# Patient Record
Sex: Male | Born: 1950
Health system: Southern US, Community
[De-identification: ages and names within clinical notes are randomized; demographics above are authoritative.]

## PROBLEM LIST (undated history)

## (undated) DIAGNOSIS — I5189 Other ill-defined heart diseases: Secondary | ICD-10-CM

## (undated) DIAGNOSIS — R079 Chest pain, unspecified: Secondary | ICD-10-CM

## (undated) DIAGNOSIS — F32A Depression, unspecified: Secondary | ICD-10-CM

## (undated) DIAGNOSIS — F17200 Nicotine dependence, unspecified, uncomplicated: Secondary | ICD-10-CM

## (undated) DIAGNOSIS — R519 Headache, unspecified: Secondary | ICD-10-CM

## (undated) DIAGNOSIS — J449 Chronic obstructive pulmonary disease, unspecified: Secondary | ICD-10-CM

## (undated) DIAGNOSIS — I219 Acute myocardial infarction, unspecified: Secondary | ICD-10-CM

## (undated) DIAGNOSIS — J40 Bronchitis, not specified as acute or chronic: Secondary | ICD-10-CM

## (undated) DIAGNOSIS — M545 Low back pain, unspecified: Secondary | ICD-10-CM

## (undated) DIAGNOSIS — G8929 Other chronic pain: Secondary | ICD-10-CM

## (undated) DIAGNOSIS — F329 Major depressive disorder, single episode, unspecified: Secondary | ICD-10-CM

## (undated) DIAGNOSIS — I2510 Atherosclerotic heart disease of native coronary artery without angina pectoris: Secondary | ICD-10-CM

## (undated) DIAGNOSIS — N183 Chronic kidney disease, stage 3 (moderate): Secondary | ICD-10-CM

## (undated) DIAGNOSIS — R0600 Dyspnea, unspecified: Secondary | ICD-10-CM

## (undated) DIAGNOSIS — R51 Headache: Secondary | ICD-10-CM

## (undated) DIAGNOSIS — R0601 Orthopnea: Secondary | ICD-10-CM

## (undated) DIAGNOSIS — E7800 Pure hypercholesterolemia, unspecified: Secondary | ICD-10-CM

## (undated) DIAGNOSIS — G4730 Sleep apnea, unspecified: Secondary | ICD-10-CM

## (undated) DIAGNOSIS — N1832 Chronic kidney disease, stage 3b: Secondary | ICD-10-CM

## (undated) DIAGNOSIS — R42 Dizziness and giddiness: Secondary | ICD-10-CM

## (undated) DIAGNOSIS — F419 Anxiety disorder, unspecified: Secondary | ICD-10-CM

## (undated) DIAGNOSIS — I209 Angina pectoris, unspecified: Secondary | ICD-10-CM

## (undated) DIAGNOSIS — H9190 Unspecified hearing loss, unspecified ear: Secondary | ICD-10-CM

## (undated) DIAGNOSIS — K219 Gastro-esophageal reflux disease without esophagitis: Secondary | ICD-10-CM

## (undated) DIAGNOSIS — I10 Essential (primary) hypertension: Secondary | ICD-10-CM

## (undated) HISTORY — DX: Sleep apnea, unspecified: G47.30

## (undated) HISTORY — DX: Dizziness and giddiness: R42

## (undated) HISTORY — DX: Other chronic pain: G89.29

## (undated) HISTORY — DX: Nicotine dependence, unspecified, uncomplicated: F17.200

## (undated) HISTORY — PX: CARDIAC CATHETERIZATION: SHX172

## (undated) HISTORY — DX: Other ill-defined heart diseases: I51.89

## (undated) HISTORY — DX: Chest pain, unspecified: R07.9

## (undated) HISTORY — PX: CORONARY ANGIOPLASTY: SHX604

---

## 1998-05-25 ENCOUNTER — Ambulatory Visit (HOSPITAL_COMMUNITY): Admission: RE | Admit: 1998-05-25 | Discharge: 1998-05-25 | Payer: Self-pay | Admitting: Pulmonary Disease

## 1998-05-25 ENCOUNTER — Encounter: Payer: Self-pay | Admitting: Pulmonary Disease

## 1998-06-10 ENCOUNTER — Encounter: Payer: Self-pay | Admitting: Pulmonary Disease

## 1998-06-10 ENCOUNTER — Ambulatory Visit (HOSPITAL_COMMUNITY): Admission: RE | Admit: 1998-06-10 | Discharge: 1998-06-10 | Payer: Self-pay | Admitting: Pulmonary Disease

## 2001-11-02 ENCOUNTER — Encounter (INDEPENDENT_AMBULATORY_CARE_PROVIDER_SITE_OTHER): Payer: Self-pay

## 2001-11-02 ENCOUNTER — Ambulatory Visit (HOSPITAL_COMMUNITY): Admission: RE | Admit: 2001-11-02 | Discharge: 2001-11-02 | Payer: Self-pay | Admitting: *Deleted

## 2002-12-20 ENCOUNTER — Encounter: Payer: Self-pay | Admitting: Family Medicine

## 2002-12-20 ENCOUNTER — Encounter: Admission: RE | Admit: 2002-12-20 | Discharge: 2002-12-20 | Payer: Self-pay | Admitting: Family Medicine

## 2002-12-21 ENCOUNTER — Encounter: Payer: Self-pay | Admitting: Family Medicine

## 2002-12-21 ENCOUNTER — Encounter: Admission: RE | Admit: 2002-12-21 | Discharge: 2002-12-21 | Payer: Self-pay | Admitting: Family Medicine

## 2003-07-01 ENCOUNTER — Encounter: Admission: RE | Admit: 2003-07-01 | Discharge: 2003-07-01 | Payer: Self-pay | Admitting: Family Medicine

## 2007-06-04 ENCOUNTER — Emergency Department (HOSPITAL_COMMUNITY): Admission: EM | Admit: 2007-06-04 | Discharge: 2007-06-04 | Payer: Self-pay | Admitting: Emergency Medicine

## 2009-06-07 ENCOUNTER — Emergency Department: Payer: Self-pay | Admitting: Emergency Medicine

## 2011-03-12 LAB — I-STAT 8, (EC8 V) (CONVERTED LAB)
Bicarbonate: 28.1 — ABNORMAL HIGH
Glucose, Bld: 107 — ABNORMAL HIGH
HCT: 48
Hemoglobin: 16.3
Operator id: 261381
Potassium: 4.1
Sodium: 135
TCO2: 30

## 2011-03-12 LAB — CBC
Hemoglobin: 15.6
MCHC: 35.8
MCV: 88.4
RBC: 4.94
WBC: 8.7

## 2011-03-12 LAB — DIFFERENTIAL
Basophils Relative: 1
Eosinophils Absolute: 1 — ABNORMAL HIGH
Lymphs Abs: 3.2
Monocytes Absolute: 0.8
Monocytes Relative: 9
Neutro Abs: 3.6

## 2011-03-12 LAB — POCT CARDIAC MARKERS
CKMB, poc: 1.3
CKMB, poc: 1.7
Myoglobin, poc: 73.9
Myoglobin, poc: 94.8
Operator id: 261381
Operator id: 261381
Troponin i, poc: <0.05
Troponin i, poc: <0.05

## 2011-03-12 LAB — B-NATRIURETIC PEPTIDE (CONVERTED LAB): Pro B Natriuretic peptide (BNP): <30

## 2011-05-22 ENCOUNTER — Emergency Department: Payer: Self-pay | Admitting: Emergency Medicine

## 2011-07-08 ENCOUNTER — Emergency Department: Payer: Self-pay | Admitting: *Deleted

## 2011-07-09 ENCOUNTER — Emergency Department: Payer: Self-pay | Admitting: Emergency Medicine

## 2011-07-09 LAB — BASIC METABOLIC PANEL
Anion Gap: 7 (ref 7–16)
BUN: 12 mg/dL (ref 7–18)
BUN: 14 mg/dL (ref 7–18)
Calcium, Total: 9.2 mg/dL (ref 8.5–10.1)
Calcium, Total: 9.6 mg/dL (ref 8.5–10.1)
Chloride: 101 mmol/L (ref 98–107)
Co2: 28 mmol/L (ref 21–32)
EGFR (African American): 52 — ABNORMAL LOW
EGFR (African American): 53 — ABNORMAL LOW
EGFR (Non-African Amer.): 44 — ABNORMAL LOW
Glucose: 97 mg/dL (ref 65–99)
Glucose: 99 mg/dL (ref 65–99)
Osmolality: 272 (ref 275–301)
Osmolality: 282 (ref 275–301)
Potassium: 4.4 mmol/L (ref 3.5–5.1)
Sodium: 141 mmol/L (ref 136–145)

## 2011-07-09 LAB — CBC
MCH: 30.6 pg (ref 26.0–34.0)
MCV: 88 fL (ref 80–100)
MCV: 89 fL (ref 80–100)
Platelet: 257 10*3/uL (ref 150–440)
Platelet: 276 10*3/uL (ref 150–440)
RBC: 4.62 10*6/uL (ref 4.40–5.90)
RDW: 13 % (ref 11.5–14.5)
WBC: 7.3 10*3/uL (ref 3.8–10.6)
WBC: 7.5 10*3/uL (ref 3.8–10.6)

## 2011-07-09 LAB — TSH: Thyroid Stimulating Horm: 0.975 u[IU]/mL

## 2011-07-09 LAB — TROPONIN I: Troponin-I: <0.02 ng/mL

## 2011-07-25 ENCOUNTER — Emergency Department: Payer: Self-pay | Admitting: Emergency Medicine

## 2011-07-25 LAB — CBC
HCT: 40.4 % (ref 40.0–52.0)
MCH: 30.3 pg (ref 26.0–34.0)
MCHC: 34 g/dL (ref 32.0–36.0)
MCV: 89 fL (ref 80–100)
RDW: 12.9 % (ref 11.5–14.5)

## 2011-07-25 LAB — COMPREHENSIVE METABOLIC PANEL
Alkaline Phosphatase: 63 U/L (ref 50–136)
Anion Gap: 11 (ref 7–16)
Chloride: 97 mmol/L — ABNORMAL LOW (ref 98–107)
Co2: 26 mmol/L (ref 21–32)
Creatinine: 2.19 mg/dL — ABNORMAL HIGH (ref 0.60–1.30)
Glucose: 106 mg/dL — ABNORMAL HIGH (ref 65–99)
Osmolality: 272 (ref 275–301)
SGOT(AST): 26 U/L (ref 15–37)
SGPT (ALT): 25 U/L
Sodium: 134 mmol/L — ABNORMAL LOW (ref 136–145)
Total Protein: 7.3 g/dL (ref 6.4–8.2)

## 2011-07-25 LAB — TROPONIN I: Troponin-I: <0.02 ng/mL

## 2012-09-21 ENCOUNTER — Emergency Department: Payer: Self-pay | Admitting: Unknown Physician Specialty

## 2012-09-21 LAB — COMPREHENSIVE METABOLIC PANEL
Albumin: 3.7 g/dL (ref 3.4–5.0)
Anion Gap: 5 — ABNORMAL LOW (ref 7–16)
Bilirubin,Total: 0.4 mg/dL (ref 0.2–1.0)
Calcium, Total: 9 mg/dL (ref 8.5–10.1)
Co2: 27 mmol/L (ref 21–32)
Creatinine: 1.84 mg/dL — ABNORMAL HIGH (ref 0.60–1.30)
EGFR (African American): 44 — ABNORMAL LOW
Glucose: 93 mg/dL (ref 65–99)
Osmolality: 273 (ref 275–301)
SGPT (ALT): 33 U/L (ref 12–78)
Sodium: 137 mmol/L (ref 136–145)

## 2012-09-21 LAB — CBC
HGB: 14.2 g/dL (ref 13.0–18.0)
MCH: 31.4 pg (ref 26.0–34.0)
MCHC: 35.1 g/dL (ref 32.0–36.0)
Platelet: 299 10*3/uL (ref 150–440)
RDW: 12.8 % (ref 11.5–14.5)

## 2012-09-21 LAB — CK TOTAL AND CKMB (NOT AT ARMC): CK, Total: 118 U/L (ref 35–232)

## 2012-09-21 LAB — TROPONIN I: Troponin-I: <0.02 ng/mL

## 2012-11-19 ENCOUNTER — Emergency Department: Payer: Self-pay | Admitting: Emergency Medicine

## 2012-11-19 LAB — BASIC METABOLIC PANEL
Anion Gap: 6 — ABNORMAL LOW (ref 7–16)
BUN: 19 mg/dL — ABNORMAL HIGH (ref 7–18)
Calcium, Total: 9.4 mg/dL (ref 8.5–10.1)
Co2: 26 mmol/L (ref 21–32)
Creatinine: 1.68 mg/dL — ABNORMAL HIGH (ref 0.60–1.30)
EGFR (African American): 49 — ABNORMAL LOW
EGFR (Non-African Amer.): 43 — ABNORMAL LOW
Glucose: 123 mg/dL — ABNORMAL HIGH (ref 65–99)
Osmolality: 276 (ref 275–301)
Potassium: 4.4 mmol/L (ref 3.5–5.1)
Sodium: 136 mmol/L (ref 136–145)

## 2012-11-19 LAB — TROPONIN I
Troponin-I: <0.02 ng/mL
Troponin-I: <0.02 ng/mL

## 2012-11-19 LAB — CK TOTAL AND CKMB (NOT AT ARMC): CK, Total: 214 U/L (ref 35–232)

## 2012-11-19 LAB — CBC
HGB: 14.8 g/dL (ref 13.0–18.0)
MCH: 30.9 pg (ref 26.0–34.0)
MCHC: 35.4 g/dL (ref 32.0–36.0)
MCV: 87 fL (ref 80–100)
RDW: 12.4 % (ref 11.5–14.5)

## 2013-01-12 ENCOUNTER — Ambulatory Visit: Payer: Self-pay | Admitting: Specialist

## 2013-01-22 ENCOUNTER — Ambulatory Visit: Payer: Self-pay | Admitting: Unknown Physician Specialty

## 2013-02-23 ENCOUNTER — Emergency Department: Payer: Self-pay | Admitting: Emergency Medicine

## 2013-02-27 ENCOUNTER — Emergency Department: Payer: Self-pay | Admitting: Emergency Medicine

## 2013-02-27 LAB — COMPREHENSIVE METABOLIC PANEL
Alkaline Phosphatase: 98 U/L (ref 50–136)
Anion Gap: 2 — ABNORMAL LOW (ref 7–16)
Bilirubin,Total: 0.6 mg/dL (ref 0.2–1.0)
Calcium, Total: 9 mg/dL (ref 8.5–10.1)
Creatinine: 1.65 mg/dL — ABNORMAL HIGH (ref 0.60–1.30)
EGFR (African American): 50 — ABNORMAL LOW
Glucose: 89 mg/dL (ref 65–99)
Potassium: 4 mmol/L (ref 3.5–5.1)
SGOT(AST): 25 U/L (ref 15–37)
SGPT (ALT): 26 U/L (ref 12–78)
Sodium: 137 mmol/L (ref 136–145)
Total Protein: 7 g/dL (ref 6.4–8.2)

## 2013-02-27 LAB — CBC
HGB: 15 g/dL (ref 13.0–18.0)
MCH: 30.6 pg (ref 26.0–34.0)
Platelet: 260 10*3/uL (ref 150–440)
RDW: 13.1 % (ref 11.5–14.5)
WBC: 7 10*3/uL (ref 3.8–10.6)

## 2013-04-20 ENCOUNTER — Emergency Department: Payer: Self-pay | Admitting: Emergency Medicine

## 2013-04-20 LAB — BASIC METABOLIC PANEL
Anion Gap: 3 — ABNORMAL LOW (ref 7–16)
BUN: 16 mg/dL (ref 7–18)
Co2: 30 mmol/L (ref 21–32)
Creatinine: 1.66 mg/dL — ABNORMAL HIGH (ref 0.60–1.30)
EGFR (African American): 50 — ABNORMAL LOW
EGFR (Non-African Amer.): 43 — ABNORMAL LOW
Potassium: 3.9 mmol/L (ref 3.5–5.1)
Sodium: 139 mmol/L (ref 136–145)

## 2013-04-20 LAB — MAGNESIUM: Magnesium: 1.6 mg/dL — ABNORMAL LOW

## 2013-04-20 LAB — CK: CK, Total: 129 U/L (ref 35–232)

## 2013-04-21 ENCOUNTER — Emergency Department: Payer: Self-pay | Admitting: Emergency Medicine

## 2013-04-21 LAB — BASIC METABOLIC PANEL
BUN: 13 mg/dL (ref 7–18)
Chloride: 104 mmol/L (ref 98–107)
Co2: 28 mmol/L (ref 21–32)
Creatinine: 1.59 mg/dL — ABNORMAL HIGH (ref 0.60–1.30)
EGFR (Non-African Amer.): 46 — ABNORMAL LOW
Osmolality: 269 (ref 275–301)
Potassium: 4 mmol/L (ref 3.5–5.1)

## 2013-04-21 LAB — URINALYSIS, COMPLETE
Bacteria: NONE SEEN
Bilirubin,UR: NEGATIVE
Glucose,UR: NEGATIVE mg/dL (ref 0–75)
Ketone: NEGATIVE
Leukocyte Esterase: NEGATIVE
Ph: 7 (ref 4.5–8.0)
Protein: NEGATIVE
RBC,UR: NONE SEEN /HPF (ref 0–5)
WBC UR: NONE SEEN /HPF (ref 0–5)

## 2013-04-21 LAB — CK: CK, Total: 182 U/L (ref 35–232)

## 2013-04-21 LAB — PHOSPHORUS: Phosphorus: 3 mg/dL (ref 2.5–4.9)

## 2013-04-21 LAB — MAGNESIUM: Magnesium: 1.6 mg/dL — ABNORMAL LOW

## 2013-04-24 ENCOUNTER — Ambulatory Visit: Payer: Self-pay | Admitting: Internal Medicine

## 2013-06-07 DIAGNOSIS — I219 Acute myocardial infarction, unspecified: Secondary | ICD-10-CM

## 2013-06-07 HISTORY — DX: Acute myocardial infarction, unspecified: I21.9

## 2013-08-22 ENCOUNTER — Emergency Department: Payer: Self-pay | Admitting: Emergency Medicine

## 2013-08-22 LAB — TROPONIN I
Troponin-I: <0.02 ng/mL
Troponin-I: <0.02 ng/mL

## 2013-08-22 LAB — BASIC METABOLIC PANEL
Anion Gap: 5 — ABNORMAL LOW (ref 7–16)
BUN: 14 mg/dL (ref 7–18)
CO2: 27 mmol/L (ref 21–32)
Calcium, Total: 9 mg/dL (ref 8.5–10.1)
Chloride: 106 mmol/L (ref 98–107)
Creatinine: 2.11 mg/dL — ABNORMAL HIGH (ref 0.60–1.30)
EGFR (African American): 37 — ABNORMAL LOW
EGFR (Non-African Amer.): 32 — ABNORMAL LOW
GLUCOSE: 84 mg/dL (ref 65–99)
Osmolality: 275 (ref 275–301)
Potassium: 3.9 mmol/L (ref 3.5–5.1)
Sodium: 138 mmol/L (ref 136–145)

## 2013-08-22 LAB — CBC
HCT: 41.9 % (ref 40.0–52.0)
HGB: 14.5 g/dL (ref 13.0–18.0)
MCH: 30.4 pg (ref 26.0–34.0)
MCHC: 34.5 g/dL (ref 32.0–36.0)
MCV: 88 fL (ref 80–100)
PLATELETS: 265 10*3/uL (ref 150–440)
RBC: 4.75 10*6/uL (ref 4.40–5.90)
RDW: 12.5 % (ref 11.5–14.5)
WBC: 7.1 10*3/uL (ref 3.8–10.6)

## 2013-10-10 ENCOUNTER — Ambulatory Visit: Payer: Self-pay | Admitting: Family Medicine

## 2013-12-11 ENCOUNTER — Emergency Department (HOSPITAL_COMMUNITY): Payer: Medicare HMO

## 2013-12-11 ENCOUNTER — Inpatient Hospital Stay (HOSPITAL_COMMUNITY)
Admission: EM | Admit: 2013-12-11 | Discharge: 2013-12-14 | DRG: 247 | Disposition: A | Discharge status: Home / Self Care | Payer: Medicare HMO | Attending: Internal Medicine | Admitting: Internal Medicine

## 2013-12-11 ENCOUNTER — Encounter (HOSPITAL_COMMUNITY): Payer: Self-pay | Admitting: Emergency Medicine

## 2013-12-11 DIAGNOSIS — I471 Supraventricular tachycardia, unspecified: Secondary | ICD-10-CM | POA: Diagnosis not present

## 2013-12-11 DIAGNOSIS — J449 Chronic obstructive pulmonary disease, unspecified: Secondary | ICD-10-CM | POA: Diagnosis present

## 2013-12-11 DIAGNOSIS — I2582 Chronic total occlusion of coronary artery: Secondary | ICD-10-CM | POA: Diagnosis present

## 2013-12-11 DIAGNOSIS — K2990 Gastroduodenitis, unspecified, without bleeding: Secondary | ICD-10-CM

## 2013-12-11 DIAGNOSIS — N179 Acute kidney failure, unspecified: Secondary | ICD-10-CM

## 2013-12-11 DIAGNOSIS — N138 Other obstructive and reflux uropathy: Secondary | ICD-10-CM | POA: Diagnosis present

## 2013-12-11 DIAGNOSIS — I4729 Other ventricular tachycardia: Secondary | ICD-10-CM | POA: Diagnosis not present

## 2013-12-11 DIAGNOSIS — K219 Gastro-esophageal reflux disease without esophagitis: Secondary | ICD-10-CM | POA: Diagnosis present

## 2013-12-11 DIAGNOSIS — E7800 Pure hypercholesterolemia, unspecified: Secondary | ICD-10-CM | POA: Diagnosis present

## 2013-12-11 DIAGNOSIS — I472 Ventricular tachycardia, unspecified: Secondary | ICD-10-CM | POA: Diagnosis not present

## 2013-12-11 DIAGNOSIS — N183 Chronic kidney disease, stage 3 unspecified: Secondary | ICD-10-CM

## 2013-12-11 DIAGNOSIS — Z7982 Long term (current) use of aspirin: Secondary | ICD-10-CM

## 2013-12-11 DIAGNOSIS — R0789 Other chest pain: Secondary | ICD-10-CM

## 2013-12-11 DIAGNOSIS — I2510 Atherosclerotic heart disease of native coronary artery without angina pectoris: Secondary | ICD-10-CM | POA: Diagnosis present

## 2013-12-11 DIAGNOSIS — I200 Unstable angina: Secondary | ICD-10-CM

## 2013-12-11 DIAGNOSIS — I25118 Atherosclerotic heart disease of native coronary artery with other forms of angina pectoris: Secondary | ICD-10-CM | POA: Diagnosis present

## 2013-12-11 DIAGNOSIS — Z881 Allergy status to other antibiotic agents status: Secondary | ICD-10-CM

## 2013-12-11 DIAGNOSIS — I4719 Other supraventricular tachycardia: Secondary | ICD-10-CM

## 2013-12-11 DIAGNOSIS — Z8249 Family history of ischemic heart disease and other diseases of the circulatory system: Secondary | ICD-10-CM

## 2013-12-11 DIAGNOSIS — I10 Essential (primary) hypertension: Secondary | ICD-10-CM

## 2013-12-11 DIAGNOSIS — Z888 Allergy status to other drugs, medicaments and biological substances status: Secondary | ICD-10-CM

## 2013-12-11 DIAGNOSIS — J4489 Other specified chronic obstructive pulmonary disease: Secondary | ICD-10-CM | POA: Diagnosis present

## 2013-12-11 DIAGNOSIS — I25110 Atherosclerotic heart disease of native coronary artery with unstable angina pectoris: Secondary | ICD-10-CM

## 2013-12-11 DIAGNOSIS — R51 Headache: Secondary | ICD-10-CM | POA: Diagnosis present

## 2013-12-11 DIAGNOSIS — Z955 Presence of coronary angioplasty implant and graft: Secondary | ICD-10-CM

## 2013-12-11 DIAGNOSIS — N401 Enlarged prostate with lower urinary tract symptoms: Secondary | ICD-10-CM | POA: Diagnosis present

## 2013-12-11 DIAGNOSIS — Z79899 Other long term (current) drug therapy: Secondary | ICD-10-CM

## 2013-12-11 DIAGNOSIS — I214 Non-ST elevation (NSTEMI) myocardial infarction: Secondary | ICD-10-CM | POA: Diagnosis not present

## 2013-12-11 DIAGNOSIS — G4730 Sleep apnea, unspecified: Secondary | ICD-10-CM | POA: Diagnosis present

## 2013-12-11 DIAGNOSIS — Z7902 Long term (current) use of antithrombotics/antiplatelets: Secondary | ICD-10-CM

## 2013-12-11 DIAGNOSIS — R339 Retention of urine, unspecified: Secondary | ICD-10-CM | POA: Diagnosis present

## 2013-12-11 DIAGNOSIS — F411 Generalized anxiety disorder: Secondary | ICD-10-CM | POA: Diagnosis present

## 2013-12-11 DIAGNOSIS — I129 Hypertensive chronic kidney disease with stage 1 through stage 4 chronic kidney disease, or unspecified chronic kidney disease: Secondary | ICD-10-CM | POA: Diagnosis present

## 2013-12-11 DIAGNOSIS — E785 Hyperlipidemia, unspecified: Secondary | ICD-10-CM

## 2013-12-11 DIAGNOSIS — I240 Acute coronary thrombosis not resulting in myocardial infarction: Secondary | ICD-10-CM

## 2013-12-11 DIAGNOSIS — Z87891 Personal history of nicotine dependence: Secondary | ICD-10-CM

## 2013-12-11 DIAGNOSIS — I959 Hypotension, unspecified: Secondary | ICD-10-CM | POA: Diagnosis present

## 2013-12-11 DIAGNOSIS — R079 Chest pain, unspecified: Secondary | ICD-10-CM | POA: Diagnosis present

## 2013-12-11 DIAGNOSIS — K2970 Gastritis, unspecified, without bleeding: Secondary | ICD-10-CM | POA: Diagnosis present

## 2013-12-11 HISTORY — DX: Headache: R51

## 2013-12-11 HISTORY — DX: Anxiety disorder, unspecified: F41.9

## 2013-12-11 HISTORY — DX: Pure hypercholesterolemia, unspecified: E78.00

## 2013-12-11 HISTORY — DX: Gastro-esophageal reflux disease without esophagitis: K21.9

## 2013-12-11 HISTORY — DX: Essential (primary) hypertension: I10

## 2013-12-11 HISTORY — DX: Chronic obstructive pulmonary disease, unspecified: J44.9

## 2013-12-11 HISTORY — DX: Headache, unspecified: R51.9

## 2013-12-11 LAB — COMPREHENSIVE METABOLIC PANEL
ALT: 21 U/L (ref 0–53)
AST: 24 U/L (ref 0–37)
Albumin: 3.7 g/dL (ref 3.5–5.2)
Alkaline Phosphatase: 69 U/L (ref 39–117)
Anion gap: 12 (ref 5–15)
BILIRUBIN TOTAL: 0.5 mg/dL (ref 0.3–1.2)
BUN: 15 mg/dL (ref 6–23)
CALCIUM: 9.1 mg/dL (ref 8.4–10.5)
CO2: 27 meq/L (ref 19–32)
CREATININE: 1.66 mg/dL — AB (ref 0.50–1.35)
Chloride: 101 mEq/L (ref 96–112)
GFR, EST AFRICAN AMERICAN: 49 mL/min — AB (ref >90)
GFR, EST NON AFRICAN AMERICAN: 42 mL/min — AB (ref >90)
GLUCOSE: 95 mg/dL (ref 70–99)
Potassium: 4.1 mEq/L (ref 3.7–5.3)
SODIUM: 140 meq/L (ref 137–147)
Total Protein: 6.6 g/dL (ref 6.0–8.3)

## 2013-12-11 LAB — TROPONIN I
Troponin I: <0.3 ng/mL (ref <0.30)
Troponin I: <0.3 ng/mL (ref <0.30)

## 2013-12-11 LAB — CBC WITH DIFFERENTIAL/PLATELET
Basophils Absolute: 0 10*3/uL (ref 0.0–0.1)
Basophils Relative: 0 % (ref 0–1)
EOS ABS: 0.9 10*3/uL — AB (ref 0.0–0.7)
EOS PCT: 11 % — AB (ref 0–5)
HCT: 39.2 % (ref 39.0–52.0)
HEMOGLOBIN: 13.4 g/dL (ref 13.0–17.0)
LYMPHS ABS: 1.5 10*3/uL (ref 0.7–4.0)
Lymphocytes Relative: 20 % (ref 12–46)
MCH: 29.8 pg (ref 26.0–34.0)
MCHC: 34.2 g/dL (ref 30.0–36.0)
MCV: 87.3 fL (ref 78.0–100.0)
MONOS PCT: 8 % (ref 3–12)
Monocytes Absolute: 0.6 10*3/uL (ref 0.1–1.0)
Neutro Abs: 4.7 10*3/uL (ref 1.7–7.7)
Neutrophils Relative %: 61 % (ref 43–77)
Platelets: 227 10*3/uL (ref 150–400)
RBC: 4.49 MIL/uL (ref 4.22–5.81)
RDW: 12.5 % (ref 11.5–15.5)
WBC: 7.7 10*3/uL (ref 4.0–10.5)

## 2013-12-11 LAB — HEMOGLOBIN A1C
Hgb A1c MFr Bld: 5.5 % (ref <5.7)
MEAN PLASMA GLUCOSE: 111 mg/dL (ref <117)

## 2013-12-11 LAB — TSH: TSH: 0.496 u[IU]/mL (ref 0.350–4.500)

## 2013-12-11 MED ORDER — ASPIRIN 300 MG RE SUPP
300.0000 mg | RECTAL | Status: AC
Start: 2013-12-12 — End: 2013-12-12
  Filled 2013-12-11: qty 1

## 2013-12-11 MED ORDER — PANTOPRAZOLE SODIUM 40 MG PO TBEC
40.0000 mg | DELAYED_RELEASE_TABLET | Freq: Every day | ORAL | Status: DC
  Administered 2013-12-11 – 2013-12-14 (×4): 40 mg via ORAL
  Filled 2013-12-11 (×4): qty 1

## 2013-12-11 MED ORDER — TIOTROPIUM BROMIDE MONOHYDRATE 18 MCG IN CAPS
18.0000 ug | ORAL_CAPSULE | Freq: Every day | RESPIRATORY_TRACT | Status: DC
  Administered 2013-12-12 – 2013-12-14 (×3): 18 ug via RESPIRATORY_TRACT
  Filled 2013-12-11 (×2): qty 5

## 2013-12-11 MED ORDER — ALBUTEROL SULFATE (2.5 MG/3ML) 0.083% IN NEBU: 2.5000 mg | INHALATION_SOLUTION | Freq: Four times a day (QID) | RESPIRATORY_TRACT | Status: DC | PRN

## 2013-12-11 MED ORDER — SIMVASTATIN 5 MG PO TABS
5.0000 mg | ORAL_TABLET | Freq: Every day | ORAL | Status: DC
Start: 2013-12-11 — End: 2013-12-12
  Administered 2013-12-11: 5 mg via ORAL
  Filled 2013-12-11 (×2): qty 1

## 2013-12-11 MED ORDER — DIAZEPAM 5 MG/ML IJ SOLN
2.5000 mg | Freq: Once | INTRAMUSCULAR | Status: AC
  Administered 2013-12-11: 2.5 mg via INTRAVENOUS
  Filled 2013-12-11: qty 2

## 2013-12-11 MED ORDER — FENOFIBRATE 160 MG PO TABS
160.0000 mg | ORAL_TABLET | Freq: Every day | ORAL | Status: DC
  Administered 2013-12-11: 160 mg via ORAL
  Filled 2013-12-11 (×2): qty 1

## 2013-12-11 MED ORDER — ACETAMINOPHEN 325 MG PO TABS
650.0000 mg | ORAL_TABLET | ORAL | Status: DC | PRN
  Administered 2013-12-12 (×2): 650 mg via ORAL
  Filled 2013-12-11 (×2): qty 2

## 2013-12-11 MED ORDER — ASPIRIN 81 MG PO CHEW
324.0000 mg | CHEWABLE_TABLET | ORAL | Status: AC
  Administered 2013-12-12: 324 mg via ORAL
  Filled 2013-12-11: qty 4

## 2013-12-11 MED ORDER — ALBUTEROL SULFATE HFA 108 (90 BASE) MCG/ACT IN AERS: 1.0000 | INHALATION_SPRAY | Freq: Four times a day (QID) | RESPIRATORY_TRACT | Status: DC | PRN

## 2013-12-11 MED ORDER — ALPRAZOLAM 0.5 MG PO TABS
1.0000 mg | ORAL_TABLET | Freq: Every day | ORAL | Status: DC | PRN
  Administered 2013-12-11 – 2013-12-13 (×3): 1 mg via ORAL
  Filled 2013-12-11 (×4): qty 2

## 2013-12-11 MED ORDER — ASPIRIN EC 81 MG PO TBEC
81.0000 mg | DELAYED_RELEASE_TABLET | Freq: Every day | ORAL | Status: DC
  Administered 2013-12-13 – 2013-12-14 (×2): 81 mg via ORAL
  Filled 2013-12-11 (×3): qty 1

## 2013-12-11 MED ORDER — SODIUM CHLORIDE 0.9 % IJ SOLN
3.0000 mL | Freq: Two times a day (BID) | INTRAMUSCULAR | Status: DC
  Administered 2013-12-11: 3 mL via INTRAVENOUS

## 2013-12-11 MED ORDER — ROFLUMILAST 500 MCG PO TABS
500.0000 ug | ORAL_TABLET | Freq: Every day | ORAL | Status: DC
  Administered 2013-12-13 – 2013-12-14 (×2): 500 ug via ORAL
  Filled 2013-12-11 (×3): qty 1

## 2013-12-11 MED ORDER — BUDESONIDE-FORMOTEROL FUMARATE 160-4.5 MCG/ACT IN AERO
1.0000 | INHALATION_SPRAY | Freq: Two times a day (BID) | RESPIRATORY_TRACT | Status: DC
  Administered 2013-12-11 – 2013-12-14 (×6): 1 via RESPIRATORY_TRACT
  Filled 2013-12-11 (×3): qty 6

## 2013-12-11 MED ORDER — ALPRAZOLAM ER 1 MG PO TB24: 3.0000 mg | ORAL_TABLET | Freq: Every day | ORAL | Status: DC

## 2013-12-11 MED ORDER — ONDANSETRON HCL 4 MG/2ML IJ SOLN
4.0000 mg | Freq: Four times a day (QID) | INTRAMUSCULAR | Status: DC | PRN
  Administered 2013-12-12 – 2013-12-13 (×2): 4 mg via INTRAVENOUS
  Filled 2013-12-11 (×2): qty 2

## 2013-12-11 MED ORDER — ALPRAZOLAM ER 0.5 MG PO TB24: 3.0000 mg | ORAL_TABLET | Freq: Every day | ORAL | Status: DC

## 2013-12-11 MED ORDER — LISINOPRIL 10 MG PO TABS
10.0000 mg | ORAL_TABLET | Freq: Two times a day (BID) | ORAL | Status: DC
  Administered 2013-12-11: 10 mg via ORAL
  Filled 2013-12-11 (×3): qty 1

## 2013-12-11 MED ORDER — SODIUM CHLORIDE 0.9 % IJ SOLN: 3.0000 mL | INTRAMUSCULAR | Status: DC | PRN

## 2013-12-11 MED ORDER — SODIUM CHLORIDE 0.9 % IV SOLN: 250.0000 mL | INTRAVENOUS | Status: DC | PRN

## 2013-12-11 MED ORDER — HEPARIN SODIUM (PORCINE) 5000 UNIT/ML IJ SOLN
5000.0000 [IU] | Freq: Three times a day (TID) | INTRAMUSCULAR | Status: DC
  Administered 2013-12-11: 5000 [IU] via SUBCUTANEOUS
  Filled 2013-12-11 (×5): qty 1

## 2013-12-11 NOTE — H&P (Signed)
  I have seen and examined the patient myself, and I have reviewed the note by Hulan Saas, MS 3 and was present during the interview and physical exam.  Please see my separate H&P for additional findings, assessment, and plan.   Signed: Drucilla Schmidt, MD 12/11/2013, 9:54 PM

## 2013-12-11 NOTE — ED Notes (Signed)
Pt wife drove pt to EMS bay in [facility] because pt started to have CP again between 0600-0630 this morning. Pt has hx of CP and has been seen at [facility] and they say it is high blood pressure. Pt CP is intermittent across chest and causes left arm numbness. Pt self administered Nitro x 1 and EMS administered ASA 324mg . Currently pain free at this time.

## 2013-12-11 NOTE — H&P (Signed)
Date: 12/11/2013               Patient Name:  William Mueller MRN: [id number]  DOB: 11-10-1950 Age / Sex: 63 y.o., male   PCP: Leonides Sake, MD         Medical Service: Internal Medicine Teaching Service         Attending Physician: Dr. Karren Cobble, MD    First Contact: Dr. Venita Lick, MD / Karlene Lineman, MS3  Pager: [phone]  Second Contact: Dr. Jerene Pitch  Pager: [phone]       After Hours (After 5p/  First Contact Pager: [phone]  weekends / holidays): Second Contact Pager: [phone]   Chief Complaint: chest pain this morning  History of Present Illness: William Mueller is a 63 yo man with a history of hypertension, hyperlipidemia, COPD (he is part of a roflumilast treatment trial), anxiety, BPH and CKD (stage III) who noticed chest pain and shortness of breath while drinking coffee on his deck this morning. The pain spanned his entire chest. It was "exploding" in nature, 10/10 in intensity and was accompanied by left arm numbness and diaphoresis. He noticed that the pain was worse on deep breath. He took some nitroglycerin and the pain subsided in 5 minutes. Though the patient has never experienced chest pain as intense as this, patient has had chest pain in the past. In fact, over the past few weeks, he has experienced pain and SOB while mowing the lawn. He also has noticed more fluctuation in his blood pressure than normal over the past 2 weeks (90/60 to 160/97, as measured by him). He had a stress test one month ago, which was negative and a heart catheterization in 1993 which showed no blockages. He does suffer from anxiety, but denies recent added stressors or anxiety above his baseline. His wife confirms that he is an avid "snorer". He has headaches daily, often in the morning, for which he takes [facility] Goody's and ibuprofen (several pills per day). He does admit to chest pain following meals, particularly those that are fatty. He had not eaten anything this morning prior to the  onset of pain. He also denies recent trauma or respiratory illness. After he took his nitroglycerin, he traveled to [place], where he received an EKG in the ambulance bay.   Meds: Current Facility-Administered Medications  Medication Dose Route Frequency Provider Last Rate Last Dose  . 0.9 %  sodium chloride infusion  250 mL Intravenous PRN Drucilla Schmidt, MD      . acetaminophen (TYLENOL) tablet 650 mg  650 mg Oral Q4H PRN Drucilla Schmidt, MD      . albuterol (PROVENTIL) (2.5 MG/3ML) 0.083% nebulizer solution 2.5 mg  2.5 mg Nebulization Q6H PRN Karren Cobble, MD      . Derrill Memo ON 12/12/2013] ALPRAZolam (XANAX XR) 24 hr tablet 3 mg  3 mg Oral Daily Karren Cobble, MD      . ALPRAZolam Duanne Moron) tablet 1 mg  1 mg Oral Daily PRN Drucilla Schmidt, MD      . Derrill Memo ON 12/12/2013] aspirin chewable tablet 324 mg  324 mg Oral NOW Drucilla Schmidt, MD       Or  . Derrill Memo ON 12/12/2013] aspirin suppository 300 mg  300 mg Rectal NOW Drucilla Schmidt, MD      . Derrill Memo ON 12/12/2013] aspirin EC tablet 81 mg  81 mg Oral Daily Drucilla Schmidt, MD      . budesonide-formoterol [facility]) 160-4.5 MCG/ACT inhaler  1 puff  1 puff Inhalation BID Drucilla Schmidt, MD      . fenofibrate tablet 160 mg  160 mg Oral Daily Drucilla Schmidt, MD   160 mg at 12/11/13 1707  . heparin injection 5,000 Units  5,000 Units Subcutaneous 3 times per day Drucilla Schmidt, MD      . lisinopril (PRINIVIL,ZESTRIL) tablet 10 mg  10 mg Oral BID Drucilla Schmidt, MD      . ondansetron [facility]) injection 4 mg  4 mg Intravenous Q6H PRN Drucilla Schmidt, MD      . pantoprazole (PROTONIX) EC tablet 40 mg  40 mg Oral Daily Drucilla Schmidt, MD   40 mg at 12/11/13 1707  . [START ON 12/12/2013] roflumilast (DALIRESP) tablet 500 mcg  500 mcg Oral Daily Drucilla Schmidt, MD      . simvastatin (ZOCOR) tablet 5 mg  5 mg Oral q1800 Drucilla Schmidt, MD   5 mg at 12/11/13 1707  . sodium chloride 0.9 % injection 3 mL  3 mL Intravenous Q12H Drucilla Schmidt, MD   3 mL at 12/11/13 1708  . sodium  chloride 0.9 % injection 3 mL  3 mL Intravenous PRN Drucilla Schmidt, MD      . Derrill Memo ON 12/12/2013] tiotropium [facility]) inhalation capsule 18 mcg  18 mcg Inhalation Daily Drucilla Schmidt, MD        Allergies: Allergies as of 12/11/2013 - Review Complete 12/11/2013  Allergen Reaction Noted  . Benzodiazepines  12/11/2013  . Serotonin reuptake inhibitors (ssris)  12/11/2013  . Tetracyclines & related  12/11/2013   Past Medical History  Diagnosis Date  . Hypertension   . Anxiety   . COPD (chronic obstructive pulmonary disease)   . High cholesterol   . GERD (gastroesophageal reflux disease)   . Daily headache   . Chronic kidney disease (CKD), stage III (moderate)    Past Surgical History  Procedure Laterality Date  . No past surgeries     History reviewed. No pertinent family history. History   Social History  . Marital Status: Married    Spouse Name: N/A    Number of Children: N/A  . Years of Education: N/A   Occupational History  . Not on file.   Social History Main Topics  . Smoking status: Former Smoker -- 3.00 packs/day for 48 years    Types: Cigarettes    Quit date: 06/07/2010  . Smokeless tobacco: Never Used  . Alcohol Use: 3.6 oz/week    6 Cans of beer per week  . Drug Use: No  . Sexual Activity: Not Currently   Other Topics Concern  . Not on file   Social History Narrative  . 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, lives with his wife, drinks 6-12 beers per weekend, no illicits,150 pack year history of smoking (quit 06/2010, now on e cigarettes)  Review of Systems: Constitutional: new fatigue over past 3 weeks, no recent URIs, no weight change HEENT: no changes in vision, decreased hearing Cardio: see HPI, no orthopnea, no edema Pulm: exercise intolerance while mowing lawn (see HPI), no cough, sputum, wheezing GI: normal BMs, no melena, no nausea or vomiting GU: some difficulty voiding, no dysuria Msk:  lower extremity edema LLE, no pain Neurologic: no numbness (other than left arm today) Psychiatric: anxiety  Physical Exam: Blood pressure 125/76, pulse 66, temperature 98 F (36.7 C), temperature source Oral, resp. rate 18, SpO2 100.00%. General: well developed, well nourished,  alert HEENT: Mallampati score: 1, PERRL Cardiac: distant heart sounds, RRR, normal S1S2, no M/R/G, no pain on chest palpation Lungs: CTAB, no W/R/R Abdomen: +BS, soft, nondistended, nontender  Extremities: bruising, especially on arms, no obvious LE edema  Neurological: CN II-XII intact, subtle tremor on arm extension Psychiatric: anxious affect  Lab results: Basic Metabolic Panel:  Recent Labs  12/11/13 1000  NA 140  K 4.1  CL 101  CO2 27  GLUCOSE 95  BUN 15  CREATININE 1.66*  CALCIUM 9.1   Liver Function Tests:  Recent Labs  12/11/13 1000  AST 24  ALT 21  ALKPHOS 69  BILITOT 0.5  PROT 6.6  ALBUMIN 3.7   No results found for this basename: LIPASE, AMYLASE,  in the last 72 hours No results found for this basename: AMMONIA,  in the last 72 hours CBC:  Recent Labs  12/11/13 1000  WBC 7.7  NEUTROABS 4.7  HGB 13.4  HCT 39.2  MCV 87.3  PLT 227   Cardiac Enzymes:  Recent Labs  12/11/13 1000 12/11/13 1600  TROPONINI <0.30 <0.30    Recent Labs  12/11/13 1600  TSH 0.496   Drugs of Abuse  No results found for this basename: labopia, cocainscrnur, labbenz, amphetmu, thcu, labbarb  Urinalysis: No results found for this basename: COLORURINE, APPERANCEUR, LABSPEC, PHURINE, GLUCOSEU, HGBUR, BILIRUBINUR, KETONESUR, PROTEINUR, UROBILINOGEN, NITRITE, LEUKOCYTESUR,  in the last 72 hours  Imaging results:  Dg Chest Portable 1 View  12/11/2013   CLINICAL DATA:  Chest and left arm pain  EXAM: PORTABLE CHEST - 1 VIEW  COMPARISON:  PA and lateral chest x-ray of May 08, 2011  FINDINGS: The lungs are mildly hyperinflated and clear. There is no pleural effusion. There are no abnormal  pulmonary parenchymal nodules. Density previously described overlying the anterior aspect of the left second rib is less conspicuous today. The heart and mediastinal structures are normal. There is no pleural effusion. The bony thorax is unremarkable.  IMPRESSION: COPD.  There is no acute cardiopulmonary abnormality.   Electronically Signed   By: David  Martinique   On: 12/11/2013 10:05    Other results: EKG: normal EKG, normal sinus rhythm, unchanged from previous tracings, normal sinus rhythm.  Assessment & Plan by Problem: Active Problems:   Chest pain This 63 yo man with hypertension, anxiety, headaches treated with NSAIDs and likely sleep apnea presented symptoms that were very concerning for ACS. However, his workup has raised concern for GERD and anxiety as possible causes for his chest pain as well.  Chest pain: though his pain and symptomatology was classic for ACS, he has had negative EKGs and 2 negative troponins at this point. He also has a history of both a negative cath and a very recent negative chemical stress test. That said, the patient may have a component of stable angina, in that he does recall some pain on exertion over the past few weeks. His TIMI II score is 2 (8% risk in 14 days all cause mortality related to MI). His heart score is 4 (12-16% chance of MI in 6 weeks). - troponins x3 - nitrostat 0.4 mg SL tablet held (will give PRN, but would like to know about patient's chest pain)  Hypertension: patient states that BPs have been fluctuating at home - trend BPs while in hospital - lisinopril 10 mg BID home dose  Hyperlipidemia:  - simvastatin 5 mg home dose  COPD:   - Continue home treatment (part of trial) on Daliresp 500 mcg -  Continue home treatment regimen with symbicort inhaler (1 puff BID), spiriva 18 mcg  - albuterol 2.5 mg PRN q6 hours  Anxiety:  - alprazolam home dosing of 24 hour 3mg  daily and 1 mg PRN  Headaches: patient takes quite a lot of NSAIDs for  headaches - hold patient's BC Headache Powder - hold patient's ibuprofen  BPH: it does not appear that this patient is taking anything for his reported urinary retention -  Bladder scan and in-and-out cath if retaining >200  CKD (stage III): creatinine 1.66  - f/u A1c - need more information about this diagnosis, baseline unknown; attempt to get records tomorrow  GERD/gastritis:  - start Protonix for trial of reflux management - zofran PRN  ?Sleep Apnea: patient's wife reports snoring - continuous pulse-ox tonight to monitor for apnea; will recommend sleep study at d/c  Diet: heart healthy diet  DVT ppx:  - heparin injection 5,000 units   Dispo: Disposition is deferred at this time, awaiting improvement of current medical problems. Anticipated discharge in approximately 1 day(s).   The patient does have a current PCP (Leonides Sake, MD) and does need an [facility] hospital follow-up appointment after discharge.  The patient does not have transportation limitations that hinder transportation to clinic appointments.  Signed: Drucilla Schmidt, MD 12/11/2013, 6:33 PM

## 2013-12-11 NOTE — ED Provider Notes (Signed)
CSN: [id number]     Arrival date & time 12/11/13  1093 History   First MD Initiated Contact with Patient 12/11/13 [phone]     Chief Complaint  Patient presents with  . Chest Pain     (Consider location/radiation/quality/duration/timing/severity/associated sxs/prior Treatment) HPI William Mueller is a 63 y.o. male who presents to the [facility] ED with the CC of chest pain. The patient has a PMH of hypertension, high cholesterol, COPD.  The pain started at 6:30 this morning.  The patient was sitting down outside with his dog when he had a sudden onset of sudden substernal pressure radiating down his left arm.  The pain was accompanied with diaphoresis, shortness of breath, lightheadedness and nausea.  The patient stopped what he was doing to try and relieve the pain but it did not work.  The pain lasted about and hour and half and was constant until it was relieved by one dose of sublingual nitro.   In the ED the patient does not complain of chest pain or nausea.  The patient had a recent stress test done about 2 months ago which he reports as normal.   Past Medical History  Diagnosis Date  . Hypertension   . Anxiety   . COPD (chronic obstructive pulmonary disease)    History reviewed. No pertinent past surgical history. No family history on file. History  Substance Use Topics  . Smoking status: Former Smoker    Types: Cigarettes    Quit date: 06/07/2010  . Smokeless tobacco: Not on file  . Alcohol Use: Yes     Comment: occassional     Review of Systems  Constitutional: Positive for diaphoresis. Negative for fever and chills.  HENT: Negative.   Respiratory: Positive for chest tightness and shortness of breath. Negative for choking and wheezing.   Cardiovascular: Positive for chest pain and leg swelling (chronic).  Gastrointestinal: Positive for nausea and abdominal pain. Negative for diarrhea, constipation, blood in stool and abdominal distention.  Genitourinary: Negative.   Musculoskeletal:  Negative.   Skin: Negative.   Neurological: Positive for dizziness.      Allergies  Benzodiazepines; Serotonin reuptake inhibitors (ssris); and Tetracyclines & related  Home Medications   Prior to Admission medications   Medication Sig Start Date End Date Taking? Authorizing Provider  albuterol (PROVENTIL HFA;VENTOLIN HFA) 108 (90 BASE) MCG/ACT inhaler Inhale 1-2 puffs into the lungs every 6 (six) hours as needed for wheezing or shortness of breath.   Yes Historical Provider, MD  ALPRAZolam (XANAX XR) 3 MG 24 hr tablet Take 3 mg by mouth daily. For anxiety control   Yes Historical Provider, MD  ALPRAZolam Duanne Moron) 1 MG tablet Take 1 mg by mouth daily as needed. For breakthrough anxiety 11/21/13  Yes Historical Provider, MD  Aspirin-Salicylamide-Caffeine (BC HEADACHE POWDER PO) Take 1 Package by mouth 2 (two) times daily as needed.   Yes Historical Provider, MD  DALIRESP 500 MCG TABS tablet Take 500 mcg by mouth daily. 11/09/13  Yes Historical Provider, MD  fenofibrate (TRICOR) 145 MG tablet Take 145 mg by mouth daily.   Yes Historical Provider, MD  ibuprofen (ADVIL,MOTRIN) 200 MG tablet Take 600 mg by mouth 2 (two) times daily as needed. 11/07/13  Yes Historical Provider, MD  lisinopril (PRINIVIL,ZESTRIL) 10 MG tablet Take 10 mg by mouth 2 (two) times daily. 10/19/13  Yes Historical Provider, MD  NITROSTAT 0.4 MG SL tablet Place 0.4 mg under the tongue every 5 (five) minutes as needed. If 3rd tablet needed  call 911 09/18/13  Yes Historical Provider, MD  pravastatin (PRAVACHOL) 10 MG tablet Take 10 mg by mouth at bedtime. 09/21/13  Yes Historical Provider, MD  SPIRIVA HANDIHALER 18 MCG inhalation capsule Place 18 mcg into inhaler and inhale daily. 11/27/13  Yes Historical Provider, MD  SYMBICORT 160-4.5 MCG/ACT inhaler Inhale 1 puff into the lungs 2 (two) times daily. 11/09/13  Yes Historical Provider, MD   BP 120/80  Pulse 81  Temp(Src) 98.3 F (36.8 C) (Oral)  Resp 17  SpO2 98% Physical Exam   Nursing note and vitals reviewed. Constitutional: He is oriented to person, place, and time. He appears well-developed and well-nourished. No distress.  HENT:  Head: Normocephalic.  Neck: Normal range of motion. Neck supple. No JVD present. No tracheal deviation present.  Cardiovascular: Normal rate, regular rhythm, S1 normal, S2 normal and intact distal pulses.  PMI is displaced.  Exam reveals distant heart sounds.   No murmur heard. Pulmonary/Chest: Effort normal and breath sounds normal. No respiratory distress. He exhibits no tenderness, no laceration and no crepitus.  Abdominal: Soft. Bowel sounds are normal. There is no tenderness. There is no rebound and no guarding.  Musculoskeletal: Normal range of motion. He exhibits no tenderness.  Lymphadenopathy:    He has no cervical adenopathy.  Neurological: He is alert and oriented to person, place, and time. No cranial nerve deficit.  Skin: Skin is warm and dry. No rash noted. He is not diaphoretic.  Psychiatric: He has a normal mood and affect.    ED Course  Procedures (including critical care time) Labs Review Labs Reviewed  CBC WITH DIFFERENTIAL  TROPONIN I  COMPREHENSIVE METABOLIC PANEL   Results for orders placed during the hospital encounter of 12/11/13  CBC WITH DIFFERENTIAL      Result Value Ref Range   WBC 7.7  4.0 - 10.5 K/uL   RBC 4.49  4.22 - 5.81 MIL/uL   Hemoglobin 13.4  13.0 - 17.0 g/dL   HCT 39.2  39.0 - 52.0 %   MCV 87.3  78.0 - 100.0 fL   MCH 29.8  26.0 - 34.0 pg   MCHC 34.2  30.0 - 36.0 g/dL   RDW 12.5  11.5 - 15.5 %   Platelets 227  150 - 400 K/uL   Neutrophils Relative % 61  43 - 77 %   Neutro Abs 4.7  1.7 - 7.7 K/uL   Lymphocytes Relative 20  12 - 46 %   Lymphs Abs 1.5  0.7 - 4.0 K/uL   Monocytes Relative 8  3 - 12 %   Monocytes Absolute 0.6  0.1 - 1.0 K/uL   Eosinophils Relative 11 (*) 0 - 5 %   Eosinophils Absolute 0.9 (*) 0.0 - 0.7 K/uL   Basophils Relative 0  0 - 1 %   Basophils Absolute 0.0   0.0 - 0.1 K/uL  TROPONIN I      Result Value Ref Range   Troponin I <0.30  <0.30 ng/mL  COMPREHENSIVE METABOLIC PANEL      Result Value Ref Range   Sodium 140  137 - 147 mEq/L   Potassium 4.1  3.7 - 5.3 mEq/L   Chloride 101  96 - 112 mEq/L   CO2 27  19 - 32 mEq/L   Glucose, Bld 95  70 - 99 mg/dL   BUN 15  6 - 23 mg/dL   Creatinine, Ser 1.66 (*) 0.50 - 1.35 mg/dL   Calcium 9.1  8.4 - 10.5 mg/dL  Total Protein 6.6  6.0 - 8.3 g/dL   Albumin 3.7  3.5 - 5.2 g/dL   AST 24  0 - 37 U/L   ALT 21  0 - 53 U/L   Alkaline Phosphatase 69  39 - 117 U/L   Total Bilirubin 0.5  0.3 - 1.2 mg/dL   GFR calc non Af Amer 42 (*) >90 mL/min   GFR calc Af Amer 49 (*) >90 mL/min   Anion gap 12  5 - 15   Dg Chest Portable 1 View  12/11/2013   CLINICAL DATA:  Chest and left arm pain  EXAM: PORTABLE CHEST - 1 VIEW  COMPARISON:  PA and lateral chest x-ray of May 08, 2011  FINDINGS: The lungs are mildly hyperinflated and clear. There is no pleural effusion. There are no abnormal pulmonary parenchymal nodules. Density previously described overlying the anterior aspect of the left second rib is less conspicuous today. The heart and mediastinal structures are normal. There is no pleural effusion. The bony thorax is unremarkable.  IMPRESSION: COPD.  There is no acute cardiopulmonary abnormality.   Electronically Signed   By: David  Martinique   On: 12/11/2013 10:05   Imaging Review No results found.   EKG Interpretation   Date/Time:  Tuesday December 11 2013 08:58:43 EDT Ventricular Rate:  93 PR Interval:  138 QRS Duration: 76 QT Interval:  367 QTC Calculation: 456 R Axis:   66 Text Interpretation:  Sinus rhythm Normal ECG Unchanged from 06/04/2007  Confirmed by DELOS  MD, DOUGLAS ([id number]) on 12/11/2013 9:03:58 AM      MDM   Final diagnoses:  None    1. Chest pain  EKG is non-acute, initial troponin negative. Patient's symptoms are concerning for ischemia, however, and admission is reasonable to rule out  MI. Discussed with the patient and family who agree to admission. Accepted by [facility] for observation admission.    Dewaine Oats, PA-C 12/11/13 1220

## 2013-12-11 NOTE — H&P (Signed)
Date: 12/11/2013               Patient Name:  William Mueller MRN: [id number]  DOB: Apr 05, 1951 Age / Sex: 63 y.o., male   PCP: Leonides Sake, MD              Medical Service: Internal Medicine Teaching Service              Attending Physician: Dr. Karren Cobble, MD    First Contact: Hulan Saas, MS 3 Pager: [phone]  Second Contact: Dr. Venita Lick, MD Pager: [phone]  Third Contact Dr. Adele Barthel, MD Pager: [phone]       After Hours (After 5p/  First Contact Pager: [phone]  weekends / holidays): Second Contact Pager: [phone]   Chief Complaint: Chest Pain   History of Present Illness:  Patient is a 63 year old male with a history of hypertension, anxiety, and COPD who presented to the emergency department with chest pain. He states the pain began earlier this morning while sitting on his deck and describes the pain as a "pressure" in the center of his chest that radiated to his left arm. He did report shortness of breath, diaphoresis and nausea, but denied any vomiting, or trauma. The patient reports the severity of his pain as a 10 out of 10. After about an hour the patient said he took nitroglycerin, which relieved the pain. Of note, patient did mention some chest pain on exertion over the last couple weeks as well as occasional chest pain associated with eating. When asked, the patient said this was not similar to any pains experienced in the past.   Meds: Prescriptions prior to admission  Medication Sig Dispense Refill  . albuterol (PROVENTIL HFA;VENTOLIN HFA) 108 (90 BASE) MCG/ACT inhaler Inhale 1-2 puffs into the lungs every 6 (six) hours as needed for wheezing or shortness of breath.      . ALPRAZolam (XANAX XR) 3 MG 24 hr tablet Take 3 mg by mouth daily. For anxiety control      . ALPRAZolam (XANAX) 1 MG tablet Take 1 mg by mouth daily as needed. For breakthrough anxiety      . Aspirin-Salicylamide-Caffeine (BC HEADACHE POWDER PO) Take 1 Package by mouth 2 (two)  times daily as needed.      Marland Kitchen DALIRESP 500 MCG TABS tablet Take 500 mcg by mouth daily.      . fenofibrate (TRICOR) 145 MG tablet Take 145 mg by mouth daily.      Marland Kitchen ibuprofen (ADVIL,MOTRIN) 200 MG tablet Take 600 mg by mouth 2 (two) times daily as needed.      Marland Kitchen lisinopril (PRINIVIL,ZESTRIL) 10 MG tablet Take 10 mg by mouth 2 (two) times daily.      Marland Kitchen NITROSTAT 0.4 MG SL tablet Place 0.4 mg under the tongue every 5 (five) minutes as needed. If 3rd tablet needed call 911      . pravastatin (PRAVACHOL) 10 MG tablet Take 10 mg by mouth at bedtime.      Marland Kitchen SPIRIVA HANDIHALER 18 MCG inhalation capsule Place 18 mcg into inhaler and inhale daily.      . SYMBICORT 160-4.5 MCG/ACT inhaler Inhale 1 puff into the lungs 2 (two) times daily.        Allergies: Allergies as of 12/11/2013 - Review Complete 12/11/2013  Allergen Reaction Noted  . Benzodiazepines  12/11/2013  . Serotonin reuptake inhibitors (ssris)  12/11/2013  . Tetracyclines & related  12/11/2013   Past Medical History  Diagnosis Date  . Hypertension   . Anxiety   . COPD (chronic obstructive pulmonary disease)   . High cholesterol   . GERD (gastroesophageal reflux disease)   . Daily headache   . Chronic kidney disease (CKD), stage III (moderate)    Past Surgical History  Procedure Laterality Date  . No past surgeries     History reviewed. No pertinent family history. History   Social History  . Marital Status: Married    Spouse Name: N/A    Number of Children: N/A  . Years of Education: N/A   Occupational History  . Not on file.   Social History Main Topics  . Smoking status: Former Smoker -- 3.00 packs/day for 48 years    Types: Cigarettes    Quit date: 06/07/2010  . Smokeless tobacco: Never Used  . Alcohol Use: 3.6 oz/week    6 Cans of beer per week  . Drug Use: No  . Sexual Activity: Not Currently   Other Topics Concern  . Not on file   Social History Narrative  . No narrative on file    Review of  Systems: Respiratory: negative except for chronic bronchitis Cardiovascular: negative except for dyspnea, exertional chest pressure/discomfort and fatigue Gastrointestinal: negative except for abdominal pain and reflux symptoms Genitourinary:negative except for decreased stream Musculoskeletal:positive for Occasional muscle cramps in calves, as well as in the neck.   Physical Exam: Blood pressure 125/76, pulse 66, temperature 98 F (36.7 C), temperature source Oral, resp. rate 18, SpO2 100.00%. Constitutional: Patient is oriented to person, place, and time. He appears well-developed and well-nourished. No acute distress.   HENT:  Head: Normocephalic and atraumatic.  Eyes: Pupils are equal, round, and reactive to light.   Cardiovascular: Normal rate and intact distal pulses. Distant heart sounds secondary to body habitus.   Pulmonary/Chest:Audible wheezing in all lung fields with no crackles or rhonchi noted.  Abdominal: No rebound or guarding, abdomen non-distended with normoactive bowel sounds. Some tenderness to palpation noted on exam.  Musculoskeletal:  Strength intact bilaterally, FROM, no pain with movement.   Neurological: He is alert and oriented to person, place, and time. No cranial nerve deficits.  Skin: Skin is warm and dry. No rash noted.  Psychiatric: He has a normal mood and affect. Behavior is normal.   Lab results: Basic Metabolic Panel:  Recent Labs Lab 12/11/13 1000  NA 140  K 4.1  CL 101  CO2 27  GLUCOSE 95  BUN 15  CREATININE 1.66*  CALCIUM 9.1   Liver Function Tests:  Recent Labs Lab 12/11/13 1000  AST 24  ALT 21  ALKPHOS 69  BILITOT 0.5  PROT 6.6  ALBUMIN 3.7   CBC:  Recent Labs Lab 12/11/13 1000  WBC 7.7  NEUTROABS 4.7  HGB 13.4  HCT 39.2  MCV 87.3  PLT 227   Cardiac Enzymes:  Recent Labs Lab 12/11/13 1000  TROPONINI <0.30   Imaging results:  Dg Chest Portable 1 View  12/11/2013   CLINICAL DATA:  Chest and left arm pain   EXAM: PORTABLE CHEST - 1 VIEW  COMPARISON:  PA and lateral chest x-ray of May 08, 2011  FINDINGS: The lungs are mildly hyperinflated and clear. There is no pleural effusion. There are no abnormal pulmonary parenchymal nodules. Density previously described overlying the anterior aspect of the left second rib is less conspicuous today. The heart and mediastinal structures are normal. There is no pleural effusion. The bony thorax is unremarkable.  IMPRESSION: COPD.  There is no acute cardiopulmonary abnormality.   Electronically Signed   By: David  Martinique   On: 12/11/2013 10:05    Other results: EKG: normal EKG, normal sinus rhythm.   Assessment & Plan by Problem:  Patient is a 63 year old male with a history of hypertension, anxiety, and COPD who presented to the emergency department with chest pain.  Chest Pain:  The differential diagnosis for the patient's chest pain includes ACS, pneumonia, musculoskeletal pain, angina secondary to an anxiety exacerbation, as well as GERD. Given the patient's recent history of fatigue and anginal episodes on exertion as well as his pain presenting with dyspnea, nausea, vomiting, and radiation to the left arm with numbness, ACS is a possible diagnosis, although unlikely due to normal stress test performed a month ago, normal EKG findings, and serum troponin. Pneumonia is less likely due to absence of leukocytosis and the patient denying any cough, sputum, fever or other sick contacts. Musculoskeletal pain is less likely as the patient denied any pain on palpation, trauma, or pain reproducible with movement. Finally GERD was also considered given the patient having reported experiencing this in the past and his use of his wife's omeprazole to treat pain. Angina secondary to an anxiety exacerbation seems to be the most likely diagnosis given his history of chronic anxiety and the absence of findings suggestive of ACS.    The plan for Mr. Sutley's chest pain includes  obtaining stress test results from Dr. Ubaldo Glassing, performing an EKG in the morning, as well as checking serum troponin levels to rule out ACS. Patient will be referred for cardiology follow-up. Also, the patient will be prescribed a PPI trial over the course of 4-6 weeks to assess for GERD related angina. In regards to patient's anxiety, he will be referred for psychiatric evaluation of anxiety and possible modification of treatment. 2   This is a Careers information officer Note.  The care of the patient was discussed with Dr. Venita Lick, MD and the assessment and plan was formulated with their assistance.  Please see their note for official documentation of the patient encounter.   Signed: Gar Gibbon, Med Student 12/11/2013, 2:48 PM

## 2013-12-11 NOTE — ED Provider Notes (Signed)
Medical screening examination/treatment/procedure(s) were performed by non-physician practitioner and as supervising physician I was immediately available for consultation/collaboration.   EKG Interpretation   Date/Time:  Tuesday December 11 2013 08:58:43 EDT Ventricular Rate:  93 PR Interval:  138 QRS Duration: 76 QT Interval:  367 QTC Calculation: 456 R Axis:   66 Text Interpretation:  Sinus rhythm Normal ECG Unchanged from 06/04/2007  Confirmed by DELOS  MD, Oronde Hallenbeck ([id number]) on 12/11/2013 9:03:58 AM       Veryl Speak, MD 12/11/13 1551

## 2013-12-12 ENCOUNTER — Encounter (HOSPITAL_COMMUNITY)
Admission: EM | Disposition: A | Discharge status: Home / Self Care | Payer: Self-pay | Source: Home / Self Care | Attending: Internal Medicine

## 2013-12-12 ENCOUNTER — Encounter (HOSPITAL_COMMUNITY): Payer: Self-pay | Admitting: Cardiology

## 2013-12-12 DIAGNOSIS — J449 Chronic obstructive pulmonary disease, unspecified: Secondary | ICD-10-CM

## 2013-12-12 DIAGNOSIS — I214 Non-ST elevation (NSTEMI) myocardial infarction: Secondary | ICD-10-CM | POA: Diagnosis not present

## 2013-12-12 DIAGNOSIS — E785 Hyperlipidemia, unspecified: Secondary | ICD-10-CM

## 2013-12-12 DIAGNOSIS — N400 Enlarged prostate without lower urinary tract symptoms: Secondary | ICD-10-CM

## 2013-12-12 DIAGNOSIS — I129 Hypertensive chronic kidney disease with stage 1 through stage 4 chronic kidney disease, or unspecified chronic kidney disease: Secondary | ICD-10-CM

## 2013-12-12 DIAGNOSIS — I472 Ventricular tachycardia: Secondary | ICD-10-CM | POA: Diagnosis not present

## 2013-12-12 DIAGNOSIS — K219 Gastro-esophageal reflux disease without esophagitis: Secondary | ICD-10-CM

## 2013-12-12 DIAGNOSIS — I471 Supraventricular tachycardia: Secondary | ICD-10-CM | POA: Diagnosis not present

## 2013-12-12 DIAGNOSIS — I4729 Other ventricular tachycardia: Secondary | ICD-10-CM | POA: Diagnosis not present

## 2013-12-12 DIAGNOSIS — I517 Cardiomegaly: Secondary | ICD-10-CM

## 2013-12-12 DIAGNOSIS — N179 Acute kidney failure, unspecified: Secondary | ICD-10-CM | POA: Diagnosis not present

## 2013-12-12 DIAGNOSIS — R079 Chest pain, unspecified: Secondary | ICD-10-CM | POA: Diagnosis present

## 2013-12-12 DIAGNOSIS — I200 Unstable angina: Secondary | ICD-10-CM

## 2013-12-12 DIAGNOSIS — I2510 Atherosclerotic heart disease of native coronary artery without angina pectoris: Secondary | ICD-10-CM

## 2013-12-12 DIAGNOSIS — F411 Generalized anxiety disorder: Secondary | ICD-10-CM

## 2013-12-12 DIAGNOSIS — N183 Chronic kidney disease, stage 3 unspecified: Secondary | ICD-10-CM

## 2013-12-12 DIAGNOSIS — R51 Headache: Secondary | ICD-10-CM

## 2013-12-12 HISTORY — PX: LEFT HEART CATHETERIZATION WITH CORONARY ANGIOGRAM: SHX5451

## 2013-12-12 LAB — POCT ACTIVATED CLOTTING TIME
Activated Clotting Time: 180 seconds
Activated Clotting Time: 253 seconds

## 2013-12-12 LAB — LIPID PANEL
CHOL/HDL RATIO: 3.9 ratio
CHOLESTEROL: 201 mg/dL — AB (ref 0–200)
HDL: 52 mg/dL (ref >39)
LDL Cholesterol: 123 mg/dL — ABNORMAL HIGH (ref 0–99)
Triglycerides: 131 mg/dL (ref <150)
VLDL: 26 mg/dL (ref 0–40)

## 2013-12-12 LAB — BASIC METABOLIC PANEL
Anion gap: 18 — ABNORMAL HIGH (ref 5–15)
BUN: 17 mg/dL (ref 6–23)
CALCIUM: 9.4 mg/dL (ref 8.4–10.5)
CHLORIDE: 100 meq/L (ref 96–112)
CO2: 24 meq/L (ref 19–32)
CREATININE: 1.76 mg/dL — AB (ref 0.50–1.35)
GFR calc Af Amer: 45 mL/min — ABNORMAL LOW (ref >90)
GFR calc non Af Amer: 39 mL/min — ABNORMAL LOW (ref >90)
GLUCOSE: 94 mg/dL (ref 70–99)
Potassium: 4.3 mEq/L (ref 3.7–5.3)
Sodium: 142 mEq/L (ref 137–147)

## 2013-12-12 LAB — TROPONIN I
Troponin I: <0.3 ng/mL (ref <0.30)
Troponin I: 2.11 ng/mL (ref <0.30)
Troponin I: 9.12 ng/mL (ref <0.30)

## 2013-12-12 SURGERY — LEFT HEART CATHETERIZATION WITH CORONARY ANGIOGRAM
Anesthesia: LOCAL

## 2013-12-12 MED ORDER — SODIUM CHLORIDE 0.9 % IV SOLN
INTRAVENOUS | Status: AC
  Administered 2013-12-12: 17:00:00 100 mL via INTRAVENOUS

## 2013-12-12 MED ORDER — ASPIRIN 81 MG PO CHEW: 81.0000 mg | CHEWABLE_TABLET | ORAL | Status: DC

## 2013-12-12 MED ORDER — VERAPAMIL HCL 2.5 MG/ML IV SOLN
INTRAVENOUS | Status: AC
  Filled 2013-12-12: qty 2

## 2013-12-12 MED ORDER — MORPHINE SULFATE 2 MG/ML IJ SOLN
2.0000 mg | Freq: Once | INTRAMUSCULAR | Status: AC
  Administered 2013-12-12: 2 mg via INTRAVENOUS
  Filled 2013-12-12: qty 1

## 2013-12-12 MED ORDER — LIDOCAINE HCL (PF) 1 % IJ SOLN
INTRAMUSCULAR | Status: AC
  Filled 2013-12-12: qty 30

## 2013-12-12 MED ORDER — SODIUM CHLORIDE 0.9 % IJ SOLN
3.0000 mL | Freq: Two times a day (BID) | INTRAMUSCULAR | Status: DC
  Administered 2013-12-12 – 2013-12-14 (×4): 3 mL via INTRAVENOUS

## 2013-12-12 MED ORDER — ATORVASTATIN CALCIUM 80 MG PO TABS
80.0000 mg | ORAL_TABLET | Freq: Every day | ORAL | Status: DC
  Administered 2013-12-12 – 2013-12-13 (×2): 80 mg via ORAL
  Filled 2013-12-12 (×3): qty 1

## 2013-12-12 MED ORDER — TICAGRELOR 90 MG PO TABS
90.0000 mg | ORAL_TABLET | Freq: Two times a day (BID) | ORAL | Status: DC
  Administered 2013-12-12 – 2013-12-14 (×4): 90 mg via ORAL
  Filled 2013-12-12 (×7): qty 1

## 2013-12-12 MED ORDER — TICAGRELOR 90 MG PO TABS
ORAL_TABLET | ORAL | Status: AC
  Filled 2013-12-12: qty 1

## 2013-12-12 MED ORDER — NITROGLYCERIN IN D5W 200-5 MCG/ML-% IV SOLN
INTRAVENOUS | Status: AC
  Filled 2013-12-12: qty 250

## 2013-12-12 MED ORDER — HEPARIN BOLUS VIA INFUSION
4000.0000 [IU] | Freq: Once | INTRAVENOUS | Status: AC
  Administered 2013-12-12: 4000 [IU] via INTRAVENOUS
  Filled 2013-12-12: qty 4000

## 2013-12-12 MED ORDER — HEPARIN (PORCINE) IN NACL 100-0.45 UNIT/ML-% IJ SOLN
1100.0000 [IU]/h | INTRAMUSCULAR | Status: DC
  Administered 2013-12-12: 1100 [IU]/h via INTRAVENOUS
  Filled 2013-12-12: qty 250

## 2013-12-12 MED ORDER — SODIUM CHLORIDE 0.9 % IV SOLN
250.0000 mL | INTRAVENOUS | Status: DC | PRN
  Administered 2013-12-12: 250 mL via INTRAVENOUS

## 2013-12-12 MED ORDER — FENTANYL CITRATE 0.05 MG/ML IJ SOLN
INTRAMUSCULAR | Status: AC
  Filled 2013-12-12: qty 2

## 2013-12-12 MED ORDER — SODIUM CHLORIDE 0.9 % IV SOLN: 250.0000 mL | INTRAVENOUS | Status: DC | PRN

## 2013-12-12 MED ORDER — NITROGLYCERIN 0.2 MG/ML ON CALL CATH LAB
INTRAVENOUS | Status: AC
  Filled 2013-12-12: qty 1

## 2013-12-12 MED ORDER — MORPHINE SULFATE 2 MG/ML IJ SOLN: 2.0000 mg | INTRAMUSCULAR | Status: DC | PRN

## 2013-12-12 MED ORDER — HEPARIN (PORCINE) IN NACL 2-0.9 UNIT/ML-% IJ SOLN
INTRAMUSCULAR | Status: AC
  Filled 2013-12-12: qty 1000

## 2013-12-12 MED ORDER — SODIUM CHLORIDE 0.9 % IJ SOLN: 3.0000 mL | INTRAMUSCULAR | Status: DC | PRN

## 2013-12-12 MED ORDER — NITROGLYCERIN IN D5W 200-5 MCG/ML-% IV SOLN
2.0000 ug/min | INTRAVENOUS | Status: DC
  Administered 2013-12-12: 2 ug/min via INTRAVENOUS

## 2013-12-12 MED ORDER — SODIUM CHLORIDE 0.9 % IV SOLN
INTRAVENOUS | Status: DC
Start: 2013-12-12 — End: 2013-12-13

## 2013-12-12 MED ORDER — NITROGLYCERIN 0.4 MG SL SUBL
SUBLINGUAL_TABLET | SUBLINGUAL | Status: AC
  Filled 2013-12-12: qty 1

## 2013-12-12 NOTE — Progress Notes (Signed)
  RN called stating patient's troponin level was elevated post-cath at 2.11. He is s/p PCI  + DES to mid RCA by Dr. Fletcher Anon earlier today. On ASA + Brilinta. He denies chest pain. Will notify Dr. Radford Pax. Will check f/u troponin at 2200.   Lyda Jester, PA-C

## 2013-12-12 NOTE — Interval H&P Note (Signed)
Cath Lab Visit (complete for each Cath Lab visit)  Clinical Evaluation Leading to the Procedure:   ACS: Yes.    Non-ACS:    Anginal Classification: CCS IV  Anti-ischemic medical therapy: Minimal Therapy (1 class of medications)  Non-Invasive Test Results: No non-invasive testing performed  Prior CABG: No previous CABG      History and Physical Interval Note:  12/12/2013 11:46 AM  William Mueller  has presented today for surgery, with the diagnosis of cp  The various methods of treatment have been discussed with the patient and family. After consideration of risks, benefits and other options for treatment, the patient has consented to  Procedure(s): LEFT HEART CATHETERIZATION WITH CORONARY ANGIOGRAM (N/A) as a surgical intervention .  The patient's history has been reviewed, patient examined, no change in status, stable for surgery.  I have reviewed the patient's chart and labs.  Questions were answered to the patient's satisfaction.     Kathlyn Sacramento

## 2013-12-12 NOTE — Progress Notes (Signed)
Subjective: Overnight, William Mueller reported experiencing chest pain that radiated to the epigastric area that he rated as 5/10 in severity, along with shortness of breath. He denied any nausea or vomiting during the episode. He states that with 2 doses of nitroglycerin the pain eventually subsided and blood pressure dropped, which caused the nurses to give him a saline bolus. Soon after his blood pressures normalized and he began to feel normal. Since then, William Mueller states he has experienced some chest pain, but without radiation, sweating, nausea, or vomiting. He states these episodes were unlike that mentioned above. At the time of the interview the patient denied any chest pain at that moment, nausea, vomiting, abdominal pain, or shortness of breath.    Objective: Vital signs in last 24 hours: Filed Vitals:   12/12/13 0939 12/12/13 1005 12/12/13 1006 12/12/13 1135  BP: 101/60  96/60   Pulse: 61   70  Temp:      TempSrc:      Resp:      Height:      Weight:      SpO2:  99%     Physical Exam:  Constitutional: Patient is oriented to person, place, and time. He appears well-developed and well-nourished. In no acute distress, but appears tired.  Cardiovascular: Normal rate and intact distal pulses. Distant heart sounds secondary to body habitus.  Pulmonary/Chest: Normal respiratory effort. Audible wheezing in all lung fields with no crackles or rhonchi noted.  Abdominal: No rebound or guarding. Abdomen non-distended, non-tender,  with normoactive bowel sounds.  Neurological: He is alert and oriented to person, place, and time. No cranial nerve deficits.  Skin: Skin is warm and dry. No rash noted.  Psychiatric: He has a normal mood and affect. Behavior is normal.   Lab Results: Basic Metabolic Panel:  Recent Labs Lab 12/11/13 1000 12/12/13 0501  NA 140 142  K 4.1 4.3  CL 101 100  CO2 27 24  GLUCOSE 95 94  BUN 15 17  CREATININE 1.66* 1.76*  CALCIUM 9.1 9.4   Liver Function  Tests:  Recent Labs Lab 12/11/13 1000  AST 24  ALT 21  ALKPHOS 69  BILITOT 0.5  PROT 6.6  ALBUMIN 3.7   CBC:  Recent Labs Lab 12/11/13 1000  WBC 7.7  NEUTROABS 4.7  HGB 13.4  HCT 39.2  MCV 87.3  PLT 227   Cardiac Enzymes:  Recent Labs Lab 12/11/13 1600 12/11/13 2250 12/12/13 0501  TROPONINI <0.30 <0.30 <0.30   Hemoglobin A1C:  Recent Labs Lab 12/11/13 1600  HGBA1C 5.5   Fasting Lipid Panel:  Recent Labs Lab 12/12/13 0501  CHOL 201*  HDL 52  LDLCALC 123*  TRIG 131  CHOLHDL 3.9   Thyroid Function Tests:  Recent Labs Lab 12/11/13 1600  TSH 0.496   Studies/Results: Dg Chest Portable 1 View  12/11/2013   CLINICAL DATA:  Chest and left arm pain  EXAM: PORTABLE CHEST - 1 VIEW  COMPARISON:  PA and lateral chest x-ray of May 08, 2011  FINDINGS: The lungs are mildly hyperinflated and clear. There is no pleural effusion. There are no abnormal pulmonary parenchymal nodules. Density previously described overlying the anterior aspect of the left second rib is less conspicuous today. The heart and mediastinal structures are normal. There is no pleural effusion. The bony thorax is unremarkable.  IMPRESSION: COPD.  There is no acute cardiopulmonary abnormality.   Electronically Signed   By: David  Martinique   On: 12/11/2013 10:05   12/12/2013  EKG: ST depression and T-wave inversions in leads V4, V5, V6, and AVL were noted during most recent anginal episode.   12/12/2013 Cardiac Catheterization: Right coronary artery was noted to be normal in size and dominant. The vessel is mildly calcified with diffuse 40% disease proximally. The vessel is occluded in the midsegment with TIMI 1 flow. Faint left to right collaterals were noted. Following diagnostic procedure, decision was made to proceed with PCI and drug-eluting stent was placed.    Medications:  Prior to Admission:  Prescriptions prior to admission  Medication Sig Dispense Refill  . albuterol (PROVENTIL  HFA;VENTOLIN HFA) 108 (90 BASE) MCG/ACT inhaler Inhale 1-2 puffs into the lungs every 6 (six) hours as needed for wheezing or shortness of breath.      . ALPRAZolam (XANAX XR) 3 MG 24 hr tablet Take 3 mg by mouth daily. For anxiety control      . ALPRAZolam (XANAX) 1 MG tablet Take 1 mg by mouth daily as needed. For breakthrough anxiety      . Aspirin-Salicylamide-Caffeine (BC HEADACHE POWDER PO) Take 1 Package by mouth 2 (two) times daily as needed.      Marland Kitchen DALIRESP 500 MCG TABS tablet Take 500 mcg by mouth daily.      . fenofibrate (TRICOR) 145 MG tablet Take 145 mg by mouth daily.      Marland Kitchen ibuprofen (ADVIL,MOTRIN) 200 MG tablet Take 600 mg by mouth 2 (two) times daily as needed.      Marland Kitchen lisinopril (PRINIVIL,ZESTRIL) 10 MG tablet Take 10 mg by mouth 2 (two) times daily.      Marland Kitchen NITROSTAT 0.4 MG SL tablet Place 0.4 mg under the tongue every 5 (five) minutes as needed. If 3rd tablet needed call 911      . pravastatin (PRAVACHOL) 10 MG tablet Take 10 mg by mouth at bedtime.      Marland Kitchen SPIRIVA HANDIHALER 18 MCG inhalation capsule Place 18 mcg into inhaler and inhale daily.      . SYMBICORT 160-4.5 MCG/ACT inhaler Inhale 1 puff into the lungs 2 (two) times daily.       Scheduled Meds: . ALPRAZolam  3 mg Oral Daily  . aspirin  81 mg Oral Pre-Cath  . aspirin EC  81 mg Oral Daily  . atorvastatin  80 mg Oral q1800  . budesonide-formoterol  1 puff Inhalation BID  . nitroGLYCERIN      . nitroGLYCERIN      . nitroGLYCERIN      . pantoprazole  40 mg Oral Daily  . roflumilast  500 mcg Oral Daily  . sodium chloride  3 mL Intravenous Q12H  . sodium chloride  3 mL Intravenous Q12H  . ticagrelor  90 mg Oral BID  . tiotropium  18 mcg Inhalation Daily   Continuous Infusions: . sodium chloride    . sodium chloride     PRN Meds:.sodium chloride, sodium chloride, acetaminophen, albuterol, ALPRAZolam, ondansetron (ZOFRAN) IV, sodium chloride, sodium chloride  Assessment/Plan: Principal Problem:   Chest pain  with moderate risk of acute coronary syndrome Active Problems:   HTN (hypertension)   CKD (chronic kidney disease) stage 3, GFR 30-59 ml/min   Hyperlipidemia   COPD (chronic obstructive pulmonary disease)   Chest pain  Chest Pain: Following cardiac catheterization, right coronary artery calcifications were noted in the right coronary artery and the decision was made to proceed with PCI and drug-eluting stent was placed. Cardiology recommends patient be placed on dual antiplatelet therapy for at least on  year, be hydrated overnight, an an echocardiogram be obtained to evaluate LV systolic function. Patient was also advised regarding the importance of both lifestyle and dietary modifications, specifically the importance of a cardiac diet.   HTN: Will continue to monitor patient's blood pressure and adjust home medication regimen appropriately.   CKD: Patient's kidney function will be monitored during hospital stay, adjustments in current medication regimen will be made as appropriate.   Hyperlipidemia: patient's cholesterol levels were noted to be elevated. Levels should be monitored for the remainder of his stay, will adjust home medications as needed.   COPD: patient has no respiratory complaints, well maintained under current medication regimen.   This is a Careers information officer Note.  The care of the patient was discussed with Dr. Sherrine Maples and the assessment and plan formulated with their assistance.  Please see their attached note for official documentation of the daily encounter.   LOS: 1 day   Gar Gibbon, Med Student 12/12/2013, 1:27 PM

## 2013-12-12 NOTE — Progress Notes (Signed)
Pt complained of CP 4/10 CP at approx 0430.  BP 146/96 HR 89 EKG obtained with changes noted. T's flattened out in lead I and  V3 and inverted T waves AVL V4 V5 V6. . MD on call paged and made aware. Jessie Foot, RN

## 2013-12-12 NOTE — Progress Notes (Signed)
TR BAND REMOVAL  LOCATION:    right radial  DEFLATED PER PROTOCOL:    Yes.    TIME BAND OFF / DRESSING APPLIED:    1600   SITE UPON ARRIVAL:    Level 0  SITE AFTER BAND REMOVAL:    Level 0  REVERSE ALLEN'S TEST:     positive  CIRCULATION SENSATION AND MOVEMENT:    Within Normal Limits   Yes.    COMMENTS:   Tolerated procedure well 

## 2013-12-12 NOTE — Progress Notes (Signed)
Subjective: Patient is resting in bed and appears tired. He says he had an eventful night, but feels ok with no pain now. No difficulty breathing. Wife at bedside, anxious. They hope he "has a blockage and needs a stent".  Objective: Vital signs in last 24 hours: Filed Vitals:   12/12/13 1400 12/12/13 1500 12/12/13 1701 12/12/13 1935  BP: 90/61 114/68 99/55 107/61  Pulse: 64 77 70 86  Temp:   97.7 F (36.5 C) 98.3 F (36.8 C)  TempSrc:   Oral Oral  Resp:   18 18  Height:      Weight:      SpO2: 98% 99% 98% 96%   Physical Exam: General: well developed, well nourished, alert but tired  HEENT: Mallampati score: 1, PERRL  Cardiac: distant heart sounds, RRR, normal S1S2, no M/R/G, no pain on chest palpation  Lungs: CTAB, no W/R/R  Abdomen: +BS, soft, nondistended, nontender  Extremities: bruising, especially on arms, no obvious LE edema  Neurological: CN II-XII intact, subtle tremor on arm extension  Psychiatric: less anxious today  Weight change:   Intake/Output Summary (Last 24 hours) at 12/12/13 2020 Last data filed at 12/12/13 1700  Gross per 24 hour  Intake    480 ml  Output    450 ml  Net     30 ml   Labs: Troponins were negative x3 following CP yesterday, then negative x1 after CP today, but 2.11 after cath today  Na 142 K 4.3 Cl 100 CO2 24 BUN 17 Cr 1.76 Ca 9.4 Glucose 94  Lipids Chol 201 Trig 131 HDL 52 LDL 123  Micro Results: No results found for this or any previous visit (from the past 240 hour(s)). Studies/Results: Dg Chest Portable 1 View  12/11/2013   CLINICAL DATA:  Chest and left arm pain  EXAM: PORTABLE CHEST - 1 VIEW  COMPARISON:  PA and lateral chest x-ray of May 08, 2011  FINDINGS: The lungs are mildly hyperinflated and clear. There is no pleural effusion. There are no abnormal pulmonary parenchymal nodules. Density previously described overlying the anterior aspect of the left second rib is less conspicuous today. The heart and  mediastinal structures are normal. There is no pleural effusion. The bony thorax is unremarkable.  IMPRESSION: COPD.  There is no acute cardiopulmonary abnormality.   Electronically Signed   By: David  Martinique   On: 12/11/2013 10:05   Cath Lab: [facility] IV Angina required left heart catheterization with PTCA and stenting of the mid RCA, 100% stenosis  Echo 7/8 post-catheterization: Impressions: - LVEF 55-60%, inferior hypokinesis, hyperdynamic anterior and lateral walls, mild LVH, normal LA size, dilated IVC suggestive of elevated RA pressure.   Medications: I have reviewed the patient's current medications. Scheduled Meds: . ALPRAZolam  3 mg Oral Daily  . aspirin EC  81 mg Oral Daily  . atorvastatin  80 mg Oral q1800  . budesonide-formoterol  1 puff Inhalation BID  . nitroGLYCERIN      . pantoprazole  40 mg Oral Daily  . roflumilast  500 mcg Oral Daily  . sodium chloride  3 mL Intravenous Q12H  . sodium chloride  3 mL Intravenous Q12H  . ticagrelor  90 mg Oral BID  . tiotropium  18 mcg Inhalation Daily   Continuous Infusions: . sodium chloride    . sodium chloride 100 mL (12/12/13 1701)   PRN Meds:.sodium chloride, sodium chloride, acetaminophen, albuterol, ALPRAZolam, ondansetron (ZOFRAN) IV, sodium chloride, sodium chloride Assessment/Plan: Principal Problem:  Chest pain with moderate risk of acute coronary syndrome Active Problems:   HTN (hypertension)   CKD (chronic kidney disease) stage 3, GFR 30-59 ml/min   Hyperlipidemia   COPD (chronic obstructive pulmonary disease)   Chest pain  This 63 yo man with HTN, HLD, sleep apnea presented with symptoms concerning for ACS; however, he had negative troponins and EKGs. Overnight, he had chest pain and ST-depressions on EKG; he went for cath today. He was treated with nitro. His BP dropped.  Though we expected to find a left sided blockage, instead he was found to have severe one-vessel coronary artery disease in the mid RCA (100%  occluded); likely chronic and the cause of the patient's angina. A drug-eluting stent was placed. This may explain the patient's drop in BP after his nitro dosing (he was probably preload dependent). The AV groove from left circumflex was also found to be subtotally occluded,and may have been what we appreciated on EKG. It was too small to stent.  A mildly elevated LVEDP was also noted. Interestingly, his TIMI II score was 2 (8% risk in 14 days all cause mortality related to MI). His heart score was 4 (12-16% chance of MI in 6 weeks).   CAD: - dual antiplatelet therapy for at least one year - hydrate overnight - Echocardiogram to evaluate for LV systolic function - give metoprolol 5mg  over 5 mins x3 for chest pain; only give nitro as secondary treatment  Hypertension: patient states that BPs have been fluctuating at home; this may be corrected with the stent - trend BPs while in hospital  - lisinopril 10 mg BID home dose  - diet counseling  Hyperlipidemia:  - simvastatin 5 mg home dose  - diet counseling  COPD:  - Continue home treatment (part of trial) on Daliresp 500 mcg  - Continue home treatment regimen with symbicort inhaler (1 puff BID), spiriva 18 mcg  - albuterol 2.5 mg PRN q6 hours   Anxiety:  - alprazolam home dosing of 24 hour 3mg  daily and 1 mg PRN   Headaches: patient takes quite a lot of NSAIDs for headaches  - hold patient's BC Headache Powder  - hold patient's ibuprofen   BPH: it does not appear that this patient is taking anything for his reported urinary retention  - Bladder scan and in-and-out cath if retaining >200   CKD (stage III): creatinine 1.66  - f/u A1c  - need more information about this diagnosis, baseline unknown; attempt to get records tomorrow   GERD/gastritis:  - start Protonix for trial of reflux management  - zofran PRN   ?Sleep Apnea: patient's wife reports snoring  - continuous pulse-ox tonight to monitor for apnea; will recommend sleep  study at d/c   Diet: heart healthy diet   DVT ppx:  - heparin injection 5,000 units   Dispo: Disposition is deferred at this time, awaiting improvement of current medical problems.  Anticipated discharge in approximately 1 day(s).   The patient does have a current PCP (Leonides Sake, MD) and does need an [facility] hospital follow-up appointment after discharge.  The patient does not have transportation limitations that hinder transportation to clinic appointments.  .Services Needed at time of discharge: Y = Yes, Blank = No PT:   OT:   RN:   Equipment:   Other:     LOS: 1 day   Drucilla Schmidt, MD 12/12/2013, 8:20 PM

## 2013-12-12 NOTE — H&P (View-Only) (Signed)
Pt. Seen and examined. Agree with the NP/PA-C note as written. Pleasant 63 yo male with recurrent SSCP, which radiates to his neck and left arm. He had a severe episode last night and was taken to EMS who sent him to the ER. Troponins have been negative. Nitroglycerin eased the pain, however, he became hypotensive . Initial EKG was normal, however, overnight when he had chest pain, he developed lateral ST Depressions of ~1 mm. He is currently on heparin gtts.  Creatinine is elevated at 1.6.  We discussed risk and benefit of cath, including increased risk of contrast nephropathy and he wishes to proceed. Would recommend more aggressive hydration this am and plan cath this afternoon. He is agreeable to this.  Pixie Casino, MD, [facility] Attending Cardiologist [place]

## 2013-12-12 NOTE — Progress Notes (Signed)
Internal Medicine Attending Admission Note Date: 12/12/2013  Patient name: William Mueller Medical record number: [id number] Date of birth: 1950/10/10 Age: 63 y.o. Gender: male  I saw and evaluated the patient. I reviewed the resident's note and I agree with the resident's findings and plan as documented in the resident's note.  Chief Complaint(s): Chest pain  History - key components related to admission:  William Mueller is a 63 year old man with a history of hypertension, hyperlipidemia, COPD, chronic kidney disease stage III, BPH, and anxiety who presents with chest pain and shortness of breath occurring on the morning of admission when he was on his deck drinking coffee. The pain was described as exploding, 10/10, associated with left arm numbness and diaphoresis. Of note, over the last 2 weeks he's had exertional chest pain associated with shortness of breath. This precipitated a stress test which was negative. He was admitted to the internal medicine teaching service for further evaluation and care.  After admission he developed recurrent chest pain. He was given nitroglycerin with a marked drop in his blood pressure. During this time he had ST and T wave changes laterally which weren't new compared to his admission ECG. Given this presentation with nitroglycerin there was a concern that he had RCA disease. He therefore underwent a cardiac catheterization which confirmed subtotal occlusion of the right coronary artery. This was stented with a drug-eluting stent.  Physical Exam - key components related to admission:  Filed Vitals:   12/12/13 1345 12/12/13 1400 12/12/13 1500 12/12/13 1701  BP: 95/59 90/61 114/68 99/55  Pulse: 67 64 77 70  Temp:    97.7 F (36.5 C)  TempSrc:    Oral  Resp:    18  Height:      Weight:      SpO2: 94% 98% 99% 98%   General: Well-developed, well-nourished, overweight man lying comfortably in bed in no acute distress. Heart: Regular rate and rhythm without  murmurs, rubs, or gallops although his heart sounds were distant.  Lab results:  Basic Metabolic Panel:  Recent Labs  12/11/13 1000 12/12/13 0501  NA 140 142  K 4.1 4.3  CL 101 100  CO2 27 24  GLUCOSE 95 94  BUN 15 17  CREATININE 1.66* 1.76*  CALCIUM 9.1 9.4   Liver Function Tests:  Recent Labs  12/11/13 1000  AST 24  ALT 21  ALKPHOS 69  BILITOT 0.5  PROT 6.6  ALBUMIN 3.7   CBC:  Recent Labs  12/11/13 1000  WBC 7.7  NEUTROABS 4.7  HGB 13.4  HCT 39.2  MCV 87.3  PLT 227   Cardiac Enzymes:  Recent Labs  12/11/13 2250 12/12/13 0501 12/12/13 1550  TROPONINI <0.30 <0.30 2.11*   Hemoglobin A1C:  Recent Labs  12/11/13 1600  HGBA1C 5.5   Fasting Lipid Panel:  Recent Labs  12/12/13 0501  CHOL 201*  HDL 52  LDLCALC 123*  TRIG 131  CHOLHDL 3.9   Thyroid Function Tests:  Recent Labs  12/11/13 1600  TSH 0.496   Imaging results:  Dg Chest Portable 1 View  12/11/2013   CLINICAL DATA:  Chest and left arm pain  EXAM: PORTABLE CHEST - 1 VIEW  COMPARISON:  PA and lateral chest x-ray of May 08, 2011  FINDINGS: The lungs are mildly hyperinflated and clear. There is no pleural effusion. There are no abnormal pulmonary parenchymal nodules. Density previously described overlying the anterior aspect of the left second rib is less conspicuous today. The heart and  mediastinal structures are normal. There is no pleural effusion. The bony thorax is unremarkable.  IMPRESSION: COPD.  There is no acute cardiopulmonary abnormality.   Electronically Signed   By: David  Martinique   On: 12/11/2013 10:05   Other results:  EKG: Dynamic ST and T wave changes laterally with chest pain.  Assessment & Plan by Problem:  William Mueller is a 63 year old man who was admitted with unstable angina. Preload reduction caused marked decrease in blood pressure and dynamic EKG changes consistent with a right coronary artery occlusion. Cardiac catheterization demonstrated a subtotal  occlusion of the right coronary artery which was stented open with a drug-eluting stent.  1) Right coronary artery subtotal occlusion: Patient underwent percutaneous coronary intervention with placement of a drug-eluting stent in the right coronary artery. We will continue Plavix and aspirin for at least one year, start him on a high potency statin, and begin a beta blocker titrating to a heart rate between 55 and 65. He will remain on a cardiac monitor to look for post procedure arrhythmias. An echocardiogram has been obtained to assess his left ventricular and right ventricular function.  2) Disposition: Discharge pending his response to today's percutaneous coronary intervention and subsequent initiation of medical therapy.

## 2013-12-12 NOTE — CV Procedure (Signed)
Cardiac Catheterization Procedure Note  Name: William Mueller MRN: [id number] DOB: Apr 21, 1951  Procedure: Left Heart Cath, Selective Coronary Angiography,  PTCA and stenting of the mid right coronary artery  Indication: Unstable angina  Medications:  Sedation:  0 mg IV Versed, 100 mcg IV Fentanyl  Contrast:  90 mL Omnipaque  Procedural Details: The right wrist was prepped, draped, and anesthetized with 1% lidocaine. Using the modified Seldinger technique, a 5 French Slender sheath was introduced into the right radial artery. 3 mg of verapamil was administered through the sheath, weight-based unfractionated heparin was administered intravenously. A Jackie catheter was used for selective coronary angiography and to measure left ventricular pressure. Catheter exchanges were performed over an exchange length guidewire. There were no immediate procedural complications.  Procedural Findings:  Hemodynamics: AO:  107/70   mmHg LV:  105/10    mmHg LVEDP: 19  mmHg  Coronary angiography: Coronary dominance: Right   Left Main:  Normal  Left Anterior Descending (LAD):  Normal in size and mildly calcified. There is diffuse 20% disease throughout the midsegment. The rest of the vessel has minor irregularities.  1st diagonal (D1):  Normal in size with 50% ostial stenosis and 50% proximal stenosis.  2nd diagonal (D2):  Large in size with 30% proximal stenosis.  3rd diagonal (D3):  Very small in size.  Circumflex (LCx):  Normal in size and nondominant. The vessel is moderately calcified with diffuse 20% disease in the midsegment.  1st obtuse marginal:  Very small in size.  2nd obtuse marginal:  Normal in size with no significant disease.  3rd obtuse marginal:  Medium in size with no significant disease.   AV groove continuation segment: Small in size and subtotally occluded proximally.   Right Coronary Artery: Normal in size and dominant. The vessel is mildly calcified with diffuse 40%  disease proximally. The vessel is occluded in the midsegment with TIMI 1 flow. Faint left to right collaterals were noted.  Posterior descending artery: Normal in size with minor irregularities.   Left ventriculography: Not performed due to chronic kidney disease.  PCI Note:  Following the diagnostic procedure, the decision was made to proceed with PCI.  Additional 4000 units of unfractionated heparin was given with an ACT of 254. Once a therapeutic ACT was achieved, a 6 [place] JR 4 guide catheter was inserted.  A run through coronary guidewire was used to cross the lesion.  The lesion was predilated with a 2.5 x 12 balloon. The lesion was not fully dilated and thus I used a 2.5 x 15 noncompliant balloon. The lesion was then stented with a 2.75 x 28 mm Xience drug-eluting stent.  The stent was postdilated with a 3.0 x 20 mm noncompliant balloon.  The proximal disease was left to be treated medically. Following PCI, there was 0% residual stenosis and TIMI-3 flow. Final angiography confirmed an excellent result. The patient tolerated the procedure well. There were no immediate procedural complications. A TR band was used for radial hemostasis. The patient was transferred to the post catheterization recovery area for further monitoring.  PCI Data: Vessel - RCA/Segment - mid Percent Stenosis (pre)  100% TIMI-flow: 1 Stent : 2.75 x 28 mm Xience drug-eluting stent Percent Stenosis (post) 0% TIMI-flow (post) 3   Final Conclusions:  1. Severe one-vessel coronary artery disease with an occluded mid right coronary artery. This is likely acute on chronic and is likely the culprit for patient's presentation. The AV groove artery from the left circumflex is subtotally  occluded but that's very small. 2. Mildly elevated left ventricular end-diastolic pressure. 3. Successful angioplasty and drug-eluting stent placement to the right coronary artery.  Recommendations:  Recommend dual antiplatelet therapy for  at least one year. Hydrated overnight. On a 90 mL of contrast was used. Obtain an echocardiogram to evaluate LV systolic function.  Kathlyn Sacramento MD, [facility] 12/12/2013, 12:59 PM

## 2013-12-12 NOTE — Progress Notes (Signed)
  Echocardiogram 2D Echocardiogram has been performed.  Diamond Nickel 12/12/2013, 3:34 PM

## 2013-12-12 NOTE — H&P (View-Only) (Signed)
Reason for Consult: Chest pain  Requesting Physician: Dr Eppie Gibson  HPI: This is a 63 y.o. male with a past medical history significant for HTN, dyslipidemia, and COPD. He is on disability for COPD. He has had chest pain in the past. He had a cath at [facility] in 1993- "normal". He was seen by Dr Ubaldo Glassing 6 weeks ago at [facility] and had a negative Myoview by his history. He wasa admitted 12/11/13 after he had an episode of SSCP yesterday am. He said he got up to walk his dog, had a cup of coffee, then developed "severe" SSCP with radiation to his Lt arm, diaphoresis, SOB, and nausea. He says it was "the worst pain I have ever had". He went to a local EMS station and was transported to [facility] ER. NTG did seem to help. He was admitted and has ruled out for an MI. His EKG initially was normal. The pt had recurrent chest pain at 4 am and his EKG then showed TWI in leads 1, AVL, V4-V6. He continues to have some chest pain, not as bad as earlier.   PMHx:  Past Medical History  Diagnosis Date  . Hypertension   . Anxiety   . COPD (chronic obstructive pulmonary disease)   . High cholesterol   . GERD (gastroesophageal reflux disease)   . Daily headache   . Chronic kidney disease (CKD), stage III (moderate)     Past Surgical History  Procedure Laterality Date  . No past surgeries     FAMHX-he says his brother has CAD  SOCHx:  reports that he quit smoking about 3 years ago. His smoking use included Cigarettes. He has a 144 pack-year smoking history. He has never used smokeless tobacco. He reports that he drinks about 3.6 ounces of alcohol per week. He reports that he does not use illicit drugs.He is married, on disability secondary to COPD. He worked in a boat yard.   ALLERGIES: Allergies  Allergen Reactions  . Benzodiazepines   . Serotonin Reuptake Inhibitors (Ssris)   . Tetracyclines & Related     ROS: A comprehensive review of systems was negative. He denies GI bleeding. No history of prostate  problems, no history of stroke. He had a colonoscopy 3 months ago- "OK"  HOME MEDICATIONS: Prior to Admission medications   Medication Sig Start Date End Date Taking? Authorizing Provider  albuterol (PROVENTIL HFA;VENTOLIN HFA) 108 (90 BASE) MCG/ACT inhaler Inhale 1-2 puffs into the lungs every 6 (six) hours as needed for wheezing or shortness of breath.   Yes Historical Provider, MD  ALPRAZolam (XANAX XR) 3 MG 24 hr tablet Take 3 mg by mouth daily. For anxiety control   Yes Historical Provider, MD  ALPRAZolam Duanne Moron) 1 MG tablet Take 1 mg by mouth daily as needed. For breakthrough anxiety 11/21/13  Yes Historical Provider, MD  Aspirin-Salicylamide-Caffeine (BC HEADACHE POWDER PO) Take 1 Package by mouth 2 (two) times daily as needed.   Yes Historical Provider, MD  DALIRESP 500 MCG TABS tablet Take 500 mcg by mouth daily. 11/09/13  Yes Historical Provider, MD  fenofibrate (TRICOR) 145 MG tablet Take 145 mg by mouth daily.   Yes Historical Provider, MD  ibuprofen (ADVIL,MOTRIN) 200 MG tablet Take 600 mg by mouth 2 (two) times daily as needed. 11/07/13  Yes Historical Provider, MD  lisinopril (PRINIVIL,ZESTRIL) 10 MG tablet Take 10 mg by mouth 2 (two) times daily. 10/19/13  Yes Historical Provider, MD  NITROSTAT 0.4 MG SL tablet Place 0.4 mg under the  tongue every 5 (five) minutes as needed. If 3rd tablet needed call 911 09/18/13  Yes Historical Provider, MD  pravastatin (PRAVACHOL) 10 MG tablet Take 10 mg by mouth at bedtime. 09/21/13  Yes Historical Provider, MD  SPIRIVA HANDIHALER 18 MCG inhalation capsule Place 18 mcg into inhaler and inhale daily. 11/27/13  Yes Historical Provider, MD  SYMBICORT 160-4.5 MCG/ACT inhaler Inhale 1 puff into the lungs 2 (two) times daily. 11/09/13  Yes Historical Provider, MD    HOSPITAL MEDICATIONS: I have reviewed the patient's current medications.  VITALS: Blood pressure 128/75, pulse 89, temperature 97.9 F (36.6 C), temperature source Oral, resp. rate 18, height 5'  9" (1.753 m), weight 182 lb 6.4 oz (82.736 kg), SpO2 99.00%.  PHYSICAL EXAM: General appearance: alert, cooperative and no distress Neck: no carotid bruit and no JVD Lungs: clear to auscultation bilaterally and overall decreased breath sounds Heart: regular rate and rhythm Abdomen: soft, non-tender; bowel sounds normal; no masses,  no organomegaly Extremities: extremities normal, atraumatic, no cyanosis or edema Pulses: 2+ and symmetric Skin: Skin color, texture, turgor normal. No rashes or lesions Neurologic: Grossly normal  LABS: Results for orders placed during the hospital encounter of 12/11/13 (from the past 24 hour(s))  CBC WITH DIFFERENTIAL     Status: Abnormal   Collection Time    12/11/13 10:00 AM      Result Value Ref Range   WBC 7.7  4.0 - 10.5 K/uL   RBC 4.49  4.22 - 5.81 MIL/uL   Hemoglobin 13.4  13.0 - 17.0 g/dL   HCT 39.2  39.0 - 52.0 %   MCV 87.3  78.0 - 100.0 fL   MCH 29.8  26.0 - 34.0 pg   MCHC 34.2  30.0 - 36.0 g/dL   RDW 12.5  11.5 - 15.5 %   Platelets 227  150 - 400 K/uL   Neutrophils Relative % 61  43 - 77 %   Neutro Abs 4.7  1.7 - 7.7 K/uL   Lymphocytes Relative 20  12 - 46 %   Lymphs Abs 1.5  0.7 - 4.0 K/uL   Monocytes Relative 8  3 - 12 %   Monocytes Absolute 0.6  0.1 - 1.0 K/uL   Eosinophils Relative 11 (*) 0 - 5 %   Eosinophils Absolute 0.9 (*) 0.0 - 0.7 K/uL   Basophils Relative 0  0 - 1 %   Basophils Absolute 0.0  0.0 - 0.1 K/uL  TROPONIN I     Status: None   Collection Time    12/11/13 10:00 AM      Result Value Ref Range   Troponin I <0.30  <0.30 ng/mL  COMPREHENSIVE METABOLIC PANEL     Status: Abnormal   Collection Time    12/11/13 10:00 AM      Result Value Ref Range   Sodium 140  137 - 147 mEq/L   Potassium 4.1  3.7 - 5.3 mEq/L   Chloride 101  96 - 112 mEq/L   CO2 27  19 - 32 mEq/L   Glucose, Bld 95  70 - 99 mg/dL   BUN 15  6 - 23 mg/dL   Creatinine, Ser 1.66 (*) 0.50 - 1.35 mg/dL   Calcium 9.1  8.4 - 10.5 mg/dL   Total Protein  6.6  6.0 - 8.3 g/dL   Albumin 3.7  3.5 - 5.2 g/dL   AST 24  0 - 37 U/L   ALT 21  0 - 53 U/L  Alkaline Phosphatase 69  39 - 117 U/L   Total Bilirubin 0.5  0.3 - 1.2 mg/dL   GFR calc non Af Amer 42 (*) >90 mL/min   GFR calc Af Amer 49 (*) >90 mL/min   Anion gap 12  5 - 15  TROPONIN I     Status: None   Collection Time    12/11/13  4:00 PM      Result Value Ref Range   Troponin I <0.30  <0.30 ng/mL  TSH     Status: None   Collection Time    12/11/13  4:00 PM      Result Value Ref Range   TSH 0.496  0.350 - 4.500 uIU/mL  HEMOGLOBIN A1C     Status: None   Collection Time    12/11/13  4:00 PM      Result Value Ref Range   Hemoglobin A1C 5.5  <5.7 %   Mean Plasma Glucose 111  <117 mg/dL  TROPONIN I     Status: None   Collection Time    12/11/13 10:50 PM      Result Value Ref Range   Troponin I <0.30  <0.30 ng/mL  LIPID PANEL     Status: Abnormal   Collection Time    12/12/13  5:01 AM      Result Value Ref Range   Cholesterol 201 (*) 0 - 200 mg/dL   Triglycerides 131  <150 mg/dL   HDL 52  >39 mg/dL   Total CHOL/HDL Ratio 3.9     VLDL 26  0 - 40 mg/dL   LDL Cholesterol 123 (*) 0 - 99 mg/dL  TROPONIN I     Status: None   Collection Time    12/12/13  5:01 AM      Result Value Ref Range   Troponin I <0.30  <0.30 ng/mL    EKG: NSR- TWI 1, AVL, V4-V6  IMAGING: Dg Chest Portable 1 View  12/11/2013   CLINICAL DATA:  Chest and left arm pain  EXAM: PORTABLE CHEST - 1 VIEW  COMPARISON:  PA and lateral chest x-ray of May 08, 2011  FINDINGS: The lungs are mildly hyperinflated and clear. There is no pleural effusion. There are no abnormal pulmonary parenchymal nodules. Density previously described overlying the anterior aspect of the left second rib is less conspicuous today. The heart and mediastinal structures are normal. There is no pleural effusion. The bony thorax is unremarkable.  IMPRESSION: COPD.  There is no acute cardiopulmonary abnormality.   Electronically Signed   By:  David  Martinique   On: 12/11/2013 10:05    IMPRESSION: Principal Problem:   Chest pain with moderate risk of acute coronary syndrome Active Problems:   HTN (hypertension)   CKD (chronic kidney disease) stage 3, GFR 30-59 ml/min   Hyperlipidemia   COPD (chronic obstructive pulmonary disease)   RECOMMENDATION: MD to see, probably should proceed with cath today, limit contrast with SCr 1.6.  Add IV NTG, Heparin added earlier this am. Hold ACE, hydrate.   Time Spent Directly with Patient: 45 minutes  Erlene Quan [phone] beeper 12/12/2013, 7:38 AM

## 2013-12-12 NOTE — Progress Notes (Signed)
Pt complained about 2/10 chest pain upon entering room for shift change report.  BP 136/82, HR 88, O2 sat 98% on room air.  EKG obtained with no acute changes noted. Pt states pain lasted approx 3 minutes and resolved prior to informing staff.  O2 applied for comfort. Will continue to monitor. Jessie Foot, RN

## 2013-12-12 NOTE — Progress Notes (Signed)
INTERNAL MEDICINE TEACHING SERVICE Night Float Progress Note   Subjective:    We were called overnight by the RN for evaluation of CP. Pt had substernal CP with radiation to the epigastric area 5/10 in severity that lasted for 5-10 minutes. It was associated with shortness of breath. Denies N/V. His CP was associated with EKG changes with ST depressions and T wave inversions in leads V4, V5, V6, and aVL. Pt was given 2 nitroglycerins and his BP dropped into the 45'W systolic. Rapid Response was called. This occurred right as I was on the floor. Nurses got pt into trendelenburg and began giving him a saline bolus--500 cc total. Dr. Ronnald Ramp was called. His pressures subsequently returned to normal 114/90 and patient stated he felt back to normal. Denied dizziness, lightheadedness, SOB. States he is still having active chest pain.     Objective:    BP 128/75  Pulse 89  Temp(Src) 97.9 F (36.6 C) (Oral)  Resp 18  Ht 5\' 9"  (1.753 m)  Wt 182 lb 6.4 oz (82.736 kg)  BMI 26.92 kg/m2  SpO2 99%   Labs: Basic Metabolic Panel:    Component Value Date/Time   NA 140 12/11/2013 1000   K 4.1 12/11/2013 1000   CL 101 12/11/2013 1000   CO2 27 12/11/2013 1000   BUN 15 12/11/2013 1000   CREATININE 1.66* 12/11/2013 1000   GLUCOSE 95 12/11/2013 1000   CALCIUM 9.1 12/11/2013 1000    CBC:    Component Value Date/Time   WBC 7.7 12/11/2013 1000   HGB 13.4 12/11/2013 1000   HCT 39.2 12/11/2013 1000   PLT 227 12/11/2013 1000   MCV 87.3 12/11/2013 1000   NEUTROABS 4.7 12/11/2013 1000   LYMPHSABS 1.5 12/11/2013 1000   MONOABS 0.6 12/11/2013 1000   EOSABS 0.9* 12/11/2013 1000   BASOSABS 0.0 12/11/2013 1000    Cardiac Enzymes: Lab Results  Component Value Date   TROPONINI <0.30 12/11/2013    Physical Exam: General: Vital signs reviewed and noted. Well-developed, well-nourished, in no acute distress; alert, appropriate and cooperative throughout examination.  Lungs:  Normal respiratory effort. Clear to auscultation BL without  crackles or wheezes.  Heart: RRR. S1 and S2 normal without gallop, murmur, or rubs.  Abdomen:  BS normoactive. Soft, Nondistended, non-tender.  No masses or organomegaly.  Extremities: No pretibial edema.     Assessment/ Plan:    Chest Pain: Pt complaining of substernal CP 5/10, lasting for >10 minutes. EKG changes with ST depressions and T wave inversions in leads V4, V5, V6, and aVL in lateral leads. Questionable ST elevation in lead III. Pt had 3 negative troponins yesterday. Pt on ASA, heparin SQ daily, lisinopril, and statin.  -Repeat troponins, trend Q6H x 3 -Cardiology consulted -Suggested heparin drip for now  -Will let day team know   Osa Craver, DO PGY-1 Internal Medicine Resident Pager # [phone] 12/12/2013 5:26 AM

## 2013-12-12 NOTE — Progress Notes (Signed)
  I have seen and examined the patient myself, and I have reviewed the note by Hulan Saas, MS 3 and was present during the interview and physical exam.  Please see my separate H&P for additional findings, assessment, and plan.   Signed: Drucilla Schmidt, MD 12/12/2013, 8:23 PM

## 2013-12-12 NOTE — Consult Note (Signed)
Reason for Consult: Chest pain  Requesting Physician: Dr Eppie Gibson  HPI: This is a 63 y.o. male with a past medical history significant for HTN, dyslipidemia, and COPD. He is on disability for COPD. He has had chest pain in the past. He had a cath at [facility] in 1993- "normal". He was seen by Dr Ubaldo Glassing 6 weeks ago at [facility] and had a negative Myoview by his history. He wasa admitted 12/11/13 after he had an episode of SSCP yesterday am. He said he got up to walk his dog, had a cup of coffee, then developed "severe" SSCP with radiation to his Lt arm, diaphoresis, SOB, and nausea. He says it was "the worst pain I have ever had". He went to a local EMS station and was transported to [facility] ER. NTG did seem to help. He was admitted and has ruled out for an MI. His EKG initially was normal. The pt had recurrent chest pain at 4 am and his EKG then showed TWI in leads 1, AVL, V4-V6. He continues to have some chest pain, not as bad as earlier.   PMHx:  Past Medical History  Diagnosis Date  . Hypertension   . Anxiety   . COPD (chronic obstructive pulmonary disease)   . High cholesterol   . GERD (gastroesophageal reflux disease)   . Daily headache   . Chronic kidney disease (CKD), stage III (moderate)     Past Surgical History  Procedure Laterality Date  . No past surgeries     FAMHX-he says his brother has CAD  SOCHx:  reports that he quit smoking about 3 years ago. His smoking use included Cigarettes. He has a 144 pack-year smoking history. He has never used smokeless tobacco. He reports that he drinks about 3.6 ounces of alcohol per week. He reports that he does not use illicit drugs.He is married, on disability secondary to COPD. He worked in a boat yard.   ALLERGIES: Allergies  Allergen Reactions  . Benzodiazepines   . Serotonin Reuptake Inhibitors (Ssris)   . Tetracyclines & Related     ROS: A comprehensive review of systems was negative. He denies GI bleeding. No history of prostate  problems, no history of stroke. He had a colonoscopy 3 months ago- "OK"  HOME MEDICATIONS: Prior to Admission medications   Medication Sig Start Date End Date Taking? Authorizing Provider  albuterol (PROVENTIL HFA;VENTOLIN HFA) 108 (90 BASE) MCG/ACT inhaler Inhale 1-2 puffs into the lungs every 6 (six) hours as needed for wheezing or shortness of breath.   Yes Historical Provider, MD  ALPRAZolam (XANAX XR) 3 MG 24 hr tablet Take 3 mg by mouth daily. For anxiety control   Yes Historical Provider, MD  ALPRAZolam Duanne Moron) 1 MG tablet Take 1 mg by mouth daily as needed. For breakthrough anxiety 11/21/13  Yes Historical Provider, MD  Aspirin-Salicylamide-Caffeine (BC HEADACHE POWDER PO) Take 1 Package by mouth 2 (two) times daily as needed.   Yes Historical Provider, MD  DALIRESP 500 MCG TABS tablet Take 500 mcg by mouth daily. 11/09/13  Yes Historical Provider, MD  fenofibrate (TRICOR) 145 MG tablet Take 145 mg by mouth daily.   Yes Historical Provider, MD  ibuprofen (ADVIL,MOTRIN) 200 MG tablet Take 600 mg by mouth 2 (two) times daily as needed. 11/07/13  Yes Historical Provider, MD  lisinopril (PRINIVIL,ZESTRIL) 10 MG tablet Take 10 mg by mouth 2 (two) times daily. 10/19/13  Yes Historical Provider, MD  NITROSTAT 0.4 MG SL tablet Place 0.4 mg under the  tongue every 5 (five) minutes as needed. If 3rd tablet needed call 911 09/18/13  Yes Historical Provider, MD  pravastatin (PRAVACHOL) 10 MG tablet Take 10 mg by mouth at bedtime. 09/21/13  Yes Historical Provider, MD  SPIRIVA HANDIHALER 18 MCG inhalation capsule Place 18 mcg into inhaler and inhale daily. 11/27/13  Yes Historical Provider, MD  SYMBICORT 160-4.5 MCG/ACT inhaler Inhale 1 puff into the lungs 2 (two) times daily. 11/09/13  Yes Historical Provider, MD    HOSPITAL MEDICATIONS: I have reviewed the patient's current medications.  VITALS: Blood pressure 128/75, pulse 89, temperature 97.9 F (36.6 C), temperature source Oral, resp. rate 18, height 5'  9" (1.753 m), weight 182 lb 6.4 oz (82.736 kg), SpO2 99.00%.  PHYSICAL EXAM: General appearance: alert, cooperative and no distress Neck: no carotid bruit and no JVD Lungs: clear to auscultation bilaterally and overall decreased breath sounds Heart: regular rate and rhythm Abdomen: soft, non-tender; bowel sounds normal; no masses,  no organomegaly Extremities: extremities normal, atraumatic, no cyanosis or edema Pulses: 2+ and symmetric Skin: Skin color, texture, turgor normal. No rashes or lesions Neurologic: Grossly normal  LABS: Results for orders placed during the hospital encounter of 12/11/13 (from the past 24 hour(s))  CBC WITH DIFFERENTIAL     Status: Abnormal   Collection Time    12/11/13 10:00 AM      Result Value Ref Range   WBC 7.7  4.0 - 10.5 K/uL   RBC 4.49  4.22 - 5.81 MIL/uL   Hemoglobin 13.4  13.0 - 17.0 g/dL   HCT 39.2  39.0 - 52.0 %   MCV 87.3  78.0 - 100.0 fL   MCH 29.8  26.0 - 34.0 pg   MCHC 34.2  30.0 - 36.0 g/dL   RDW 12.5  11.5 - 15.5 %   Platelets 227  150 - 400 K/uL   Neutrophils Relative % 61  43 - 77 %   Neutro Abs 4.7  1.7 - 7.7 K/uL   Lymphocytes Relative 20  12 - 46 %   Lymphs Abs 1.5  0.7 - 4.0 K/uL   Monocytes Relative 8  3 - 12 %   Monocytes Absolute 0.6  0.1 - 1.0 K/uL   Eosinophils Relative 11 (*) 0 - 5 %   Eosinophils Absolute 0.9 (*) 0.0 - 0.7 K/uL   Basophils Relative 0  0 - 1 %   Basophils Absolute 0.0  0.0 - 0.1 K/uL  TROPONIN I     Status: None   Collection Time    12/11/13 10:00 AM      Result Value Ref Range   Troponin I <0.30  <0.30 ng/mL  COMPREHENSIVE METABOLIC PANEL     Status: Abnormal   Collection Time    12/11/13 10:00 AM      Result Value Ref Range   Sodium 140  137 - 147 mEq/L   Potassium 4.1  3.7 - 5.3 mEq/L   Chloride 101  96 - 112 mEq/L   CO2 27  19 - 32 mEq/L   Glucose, Bld 95  70 - 99 mg/dL   BUN 15  6 - 23 mg/dL   Creatinine, Ser 1.66 (*) 0.50 - 1.35 mg/dL   Calcium 9.1  8.4 - 10.5 mg/dL   Total Protein  6.6  6.0 - 8.3 g/dL   Albumin 3.7  3.5 - 5.2 g/dL   AST 24  0 - 37 U/L   ALT 21  0 - 53 U/L  Alkaline Phosphatase 69  39 - 117 U/L   Total Bilirubin 0.5  0.3 - 1.2 mg/dL   GFR calc non Af Amer 42 (*) >90 mL/min   GFR calc Af Amer 49 (*) >90 mL/min   Anion gap 12  5 - 15  TROPONIN I     Status: None   Collection Time    12/11/13  4:00 PM      Result Value Ref Range   Troponin I <0.30  <0.30 ng/mL  TSH     Status: None   Collection Time    12/11/13  4:00 PM      Result Value Ref Range   TSH 0.496  0.350 - 4.500 uIU/mL  HEMOGLOBIN A1C     Status: None   Collection Time    12/11/13  4:00 PM      Result Value Ref Range   Hemoglobin A1C 5.5  <5.7 %   Mean Plasma Glucose 111  <117 mg/dL  TROPONIN I     Status: None   Collection Time    12/11/13 10:50 PM      Result Value Ref Range   Troponin I <0.30  <0.30 ng/mL  LIPID PANEL     Status: Abnormal   Collection Time    12/12/13  5:01 AM      Result Value Ref Range   Cholesterol 201 (*) 0 - 200 mg/dL   Triglycerides 131  <150 mg/dL   HDL 52  >39 mg/dL   Total CHOL/HDL Ratio 3.9     VLDL 26  0 - 40 mg/dL   LDL Cholesterol 123 (*) 0 - 99 mg/dL  TROPONIN I     Status: None   Collection Time    12/12/13  5:01 AM      Result Value Ref Range   Troponin I <0.30  <0.30 ng/mL    EKG: NSR- TWI 1, AVL, V4-V6  IMAGING: Dg Chest Portable 1 View  12/11/2013   CLINICAL DATA:  Chest and left arm pain  EXAM: PORTABLE CHEST - 1 VIEW  COMPARISON:  PA and lateral chest x-ray of May 08, 2011  FINDINGS: The lungs are mildly hyperinflated and clear. There is no pleural effusion. There are no abnormal pulmonary parenchymal nodules. Density previously described overlying the anterior aspect of the left second rib is less conspicuous today. The heart and mediastinal structures are normal. There is no pleural effusion. The bony thorax is unremarkable.  IMPRESSION: COPD.  There is no acute cardiopulmonary abnormality.   Electronically Signed   By:  David  Martinique   On: 12/11/2013 10:05    IMPRESSION: Principal Problem:   Chest pain with moderate risk of acute coronary syndrome Active Problems:   HTN (hypertension)   CKD (chronic kidney disease) stage 3, GFR 30-59 ml/min   Hyperlipidemia   COPD (chronic obstructive pulmonary disease)   RECOMMENDATION: MD to see, probably should proceed with cath today, limit contrast with SCr 1.6.  Add IV NTG, Heparin added earlier this am. Hold ACE, hydrate.   Time Spent Directly with Patient: 45 minutes  Erlene Quan [phone] beeper 12/12/2013, 7:38 AM

## 2013-12-12 NOTE — Progress Notes (Signed)
Pt was given 2nd NTG for continued c/o chest pain and SBP 50's, pt became diaphoretic/clammy and lightheaded.  Rapid Response notified and NS bolus initiated.  MD also on floor and notified and in to see pt.  BP responded well to NS bolus. Currently 108/60.  Will continue to monitor closely. Jessie Foot, RN

## 2013-12-12 NOTE — Consult Note (Signed)
Pt. Seen and examined. Agree with the NP/PA-C note as written. Pleasant 63 yo male with recurrent SSCP, which radiates to his neck and left arm. He had a severe episode last night and was taken to EMS who sent him to the ER. Troponins have been negative. Nitroglycerin eased the pain, however, he became hypotensive . Initial EKG was normal, however, overnight when he had chest pain, he developed lateral ST Depressions of ~1 mm. He is currently on heparin gtts.  Creatinine is elevated at 1.6.  We discussed risk and benefit of cath, including increased risk of contrast nephropathy and he wishes to proceed. Would recommend more aggressive hydration this am and plan cath this afternoon. He is agreeable to this.  Pixie Casino, MD, [facility] Attending Cardiologist [place]

## 2013-12-12 NOTE — Progress Notes (Signed)
ANTICOAGULATION CONSULT NOTE - Initial Consult  Pharmacy Consult for heparin Indication: chest pain/ACS  Allergies  Allergen Reactions  . Benzodiazepines   . Serotonin Reuptake Inhibitors (Ssris)   . Tetracyclines & Related     Patient Measurements: Height: 5\' 9"  (175.3 cm) Weight: 182 lb 6.4 oz (82.736 kg) IBW/kg (Calculated) : 70.7  Vital Signs: Temp: 97.9 F (36.6 C) (07/08 0432) Temp src: Oral (07/08 0432) BP: 128/75 mmHg (07/08 0455) Pulse Rate: 89 (07/08 0432)  Labs:  Recent Labs  12/11/13 1000 12/11/13 1600 12/11/13 2250  HGB 13.4  --   --   HCT 39.2  --   --   PLT 227  --   --   CREATININE 1.66*  --   --   TROPONINI <0.30 <0.30 <0.30    Estimated Creatinine Clearance: 45 ml/min (by C-G formula based on Cr of 1.66).   Medical History: Past Medical History  Diagnosis Date  . Hypertension   . Anxiety   . COPD (chronic obstructive pulmonary disease)   . High cholesterol   . GERD (gastroesophageal reflux disease)   . Daily headache   . Chronic kidney disease (CKD), stage III (moderate)     Medications:  Prescriptions prior to admission  Medication Sig Dispense Refill  . albuterol (PROVENTIL HFA;VENTOLIN HFA) 108 (90 BASE) MCG/ACT inhaler Inhale 1-2 puffs into the lungs every 6 (six) hours as needed for wheezing or shortness of breath.      . ALPRAZolam (XANAX XR) 3 MG 24 hr tablet Take 3 mg by mouth daily. For anxiety control      . ALPRAZolam (XANAX) 1 MG tablet Take 1 mg by mouth daily as needed. For breakthrough anxiety      . Aspirin-Salicylamide-Caffeine (BC HEADACHE POWDER PO) Take 1 Package by mouth 2 (two) times daily as needed.      Marland Kitchen DALIRESP 500 MCG TABS tablet Take 500 mcg by mouth daily.      . fenofibrate (TRICOR) 145 MG tablet Take 145 mg by mouth daily.      Marland Kitchen ibuprofen (ADVIL,MOTRIN) 200 MG tablet Take 600 mg by mouth 2 (two) times daily as needed.      Marland Kitchen lisinopril (PRINIVIL,ZESTRIL) 10 MG tablet Take 10 mg by mouth 2 (two) times  daily.      Marland Kitchen NITROSTAT 0.4 MG SL tablet Place 0.4 mg under the tongue every 5 (five) minutes as needed. If 3rd tablet needed call 911      . pravastatin (PRAVACHOL) 10 MG tablet Take 10 mg by mouth at bedtime.      Marland Kitchen SPIRIVA HANDIHALER 18 MCG inhalation capsule Place 18 mcg into inhaler and inhale daily.      . SYMBICORT 160-4.5 MCG/ACT inhaler Inhale 1 puff into the lungs 2 (two) times daily.       Scheduled:  . ALPRAZolam  3 mg Oral Daily  . aspirin  324 mg Oral NOW   Or  . aspirin  300 mg Rectal NOW  . aspirin EC  81 mg Oral Daily  . budesonide-formoterol  1 puff Inhalation BID  . fenofibrate  160 mg Oral Daily  . heparin  5,000 Units Subcutaneous 3 times per day  . lisinopril  10 mg Oral BID  .  morphine injection  2 mg Intravenous Once  . nitroGLYCERIN      . pantoprazole  40 mg Oral Daily  . roflumilast  500 mcg Oral Daily  . simvastatin  5 mg Oral q1800  . sodium  chloride  3 mL Intravenous Q12H  . tiotropium  18 mcg Inhalation Daily    Assessment: 63yo male c/o CP on arrival to ED and was admitted for further w/u, while on floor pt c/o substernal CP w/ radiation to epigastric area that lasted x5-55min, ECG changes noted, troponin remains negative, to transition from SQ heparin to IV.  Goal of Therapy:  Heparin level 0.3-0.7 units/ml Monitor platelets by anticoagulation protocol: Yes   Plan:  Rec'd SQ heparin ~8hr ago; will give heparin 4000 units IV bolus x1 followed by gtt at 1100 units/hr and monitor heparin levels and CBC.  Wynona Neat, PharmD, BCPS  12/12/2013,5:40 AM

## 2013-12-12 NOTE — Progress Notes (Signed)
Pt c/o chest pressure that lasted approx 1 minute. BP 152/95 HR 80.  Pt given scheduled meds and PRN xanax per request. Will continue to monitor.Jessie Foot, RN

## 2013-12-12 NOTE — Care Management Note (Addendum)
  Page 1 of 1   12/13/2013     3:19:42 PM CARE MANAGEMENT NOTE 12/13/2013  Patient:  DEUNTAE, KOCSIS   Account Number:  [id number]  Date Initiated:  12/12/2013  Documentation initiated by:  Myangel Summons  Subjective/Objective Assessment:   Chest Pain     Action/Plan:   CM to follow for dispostion needs   Anticipated DC Date:  12/13/2013   Anticipated DC Plan:  HOME/SELF CARE         Choice offered to / List presented to:             Status of service:  In process, will continue to follow Medicare Important Message given?   (If response is "NO", the following Medicare IM given date fields will be blank) Date Medicare IM given:   Medicare IM given by:   Date Additional Medicare IM given:   Additional Medicare IM given by:    Discharge Disposition:  HOME/SELF CARE  Per UR Regulation:  Reviewed for med. necessity/level of care/duration of stay  If discussed at [place] of Stay Meetings, dates discussed:    Comments:  Vincent Ehrler RN, BSN, MSHL, CCM  Nurse - Case Manager, (Unit [phone]  12/13/2013 Benefits check: PT COPAY WILL BE $43.05 - NO PRIOR AUTH REQUIRED ticagrelor (BRILINTA) tablet 90 mg  Roxanne Panek RN, BSN, MSHL, CCM  Nurse - Case Manager, (Unit [phone]  12/12/2013 Benefits check: in progress ticagrelor (BRILINTA) tablet 90 mg Dose: 90 mg Freq: 2 times daily Route: PO Please check coverage, co-pays, authorizations, deductibles, and preferred pharm.

## 2013-12-13 ENCOUNTER — Telehealth: Payer: Self-pay

## 2013-12-13 DIAGNOSIS — K2970 Gastritis, unspecified, without bleeding: Secondary | ICD-10-CM

## 2013-12-13 DIAGNOSIS — I214 Non-ST elevation (NSTEMI) myocardial infarction: Secondary | ICD-10-CM | POA: Diagnosis not present

## 2013-12-13 DIAGNOSIS — I472 Ventricular tachycardia: Secondary | ICD-10-CM | POA: Diagnosis not present

## 2013-12-13 DIAGNOSIS — I4729 Other ventricular tachycardia: Secondary | ICD-10-CM | POA: Diagnosis not present

## 2013-12-13 DIAGNOSIS — I471 Supraventricular tachycardia: Secondary | ICD-10-CM | POA: Diagnosis not present

## 2013-12-13 DIAGNOSIS — I25118 Atherosclerotic heart disease of native coronary artery with other forms of angina pectoris: Secondary | ICD-10-CM | POA: Diagnosis present

## 2013-12-13 DIAGNOSIS — N179 Acute kidney failure, unspecified: Secondary | ICD-10-CM | POA: Diagnosis not present

## 2013-12-13 DIAGNOSIS — I25110 Atherosclerotic heart disease of native coronary artery with unstable angina pectoris: Secondary | ICD-10-CM | POA: Diagnosis present

## 2013-12-13 DIAGNOSIS — K2990 Gastroduodenitis, unspecified, without bleeding: Secondary | ICD-10-CM

## 2013-12-13 LAB — BASIC METABOLIC PANEL
Anion gap: 13 (ref 5–15)
BUN: 20 mg/dL (ref 6–23)
CHLORIDE: 99 meq/L (ref 96–112)
CO2: 25 meq/L (ref 19–32)
CREATININE: 1.95 mg/dL — AB (ref 0.50–1.35)
Calcium: 8.4 mg/dL (ref 8.4–10.5)
GFR calc non Af Amer: 35 mL/min — ABNORMAL LOW (ref >90)
GFR, EST AFRICAN AMERICAN: 40 mL/min — AB (ref >90)
GLUCOSE: 91 mg/dL (ref 70–99)
Potassium: 4 mEq/L (ref 3.7–5.3)
Sodium: 137 mEq/L (ref 137–147)

## 2013-12-13 LAB — CBC
HEMATOCRIT: 35.3 % — AB (ref 39.0–52.0)
HEMOGLOBIN: 12 g/dL — AB (ref 13.0–17.0)
MCH: 30.2 pg (ref 26.0–34.0)
MCHC: 34 g/dL (ref 30.0–36.0)
MCV: 88.7 fL (ref 78.0–100.0)
Platelets: 228 10*3/uL (ref 150–400)
RBC: 3.98 MIL/uL — ABNORMAL LOW (ref 4.22–5.81)
RDW: 12.6 % (ref 11.5–15.5)
WBC: 8.2 10*3/uL (ref 4.0–10.5)

## 2013-12-13 LAB — MAGNESIUM: MAGNESIUM: 2.1 mg/dL (ref 1.5–2.5)

## 2013-12-13 LAB — TROPONIN I: Troponin I: 9.17 ng/mL (ref <0.30)

## 2013-12-13 MED ORDER — ALPRAZOLAM 0.5 MG PO TABS: 3.0000 mg | ORAL_TABLET | Freq: Every day | ORAL | Status: DC

## 2013-12-13 MED ORDER — SODIUM CHLORIDE 0.9 % IV SOLN
INTRAVENOUS | Status: AC
  Administered 2013-12-13: 12:00:00 via INTRAVENOUS

## 2013-12-13 MED ORDER — ALPRAZOLAM 0.5 MG PO TABS
1.0000 mg | ORAL_TABLET | Freq: Three times a day (TID) | ORAL | Status: DC
  Administered 2013-12-13 – 2013-12-14 (×2): 1 mg via ORAL
  Filled 2013-12-13 (×2): qty 2

## 2013-12-13 NOTE — Progress Notes (Signed)
Patient: William Mueller / Admit Date: 12/11/2013 / Date of Encounter: 12/13/2013, 6:50 AM   Subjective: Feels great. No complaints.   Objective: Telemetry: NSR - brief paroxysmal AIVR overnight while sleeping, 13 beats NSVT Physical Exam: Blood pressure 137/89, pulse 79, temperature 98.1 F (36.7 C), temperature source Oral, resp. rate 20, height 5\' 9"  (1.753 m), weight 189 lb 6 oz (85.9 kg), SpO2 96.00%. General: Well developed, well nourished WM in no acute distress. Head: Normocephalic, atraumatic, sclera non-icteric, no xanthomas, nares are without discharge. Neck: Negative for carotid bruits. JVP not elevated. Lungs: Diminished BS throughout. No wheezes, rales, or rhonchi. Breathing is unlabored. Heart: RRR S1 S2 without murmurs, rubs, or gallops.  Abdomen: Soft, non-tender, non-distended with normoactive bowel sounds. No rebound/guarding. Extremities: No clubbing or cyanosis. No edema. Distal pedal pulses are 2+ and equal bilaterally. Neuro: Alert and oriented X 3. Moves all extremities spontaneously. Psych:  Responds to questions appropriately with a normal affect.   Intake/Output Summary (Last 24 hours) at 12/13/13 0650 Last data filed at 12/12/13 2300  Gross per 24 hour  Intake   1430 ml  Output    450 ml  Net    980 ml    Inpatient Medications:  . ALPRAZolam  3 mg Oral Daily  . aspirin EC  81 mg Oral Daily  . atorvastatin  80 mg Oral q1800  . budesonide-formoterol  1 puff Inhalation BID  . pantoprazole  40 mg Oral Daily  . roflumilast  500 mcg Oral Daily  . sodium chloride  3 mL Intravenous Q12H  . sodium chloride  3 mL Intravenous Q12H  . ticagrelor  90 mg Oral BID  . tiotropium  18 mcg Inhalation Daily   Infusions:  . sodium chloride      Labs:  Recent Labs  12/12/13 0501 12/13/13 0330  NA 142 137  K 4.3 4.0  CL 100 99  CO2 24 25  GLUCOSE 94 91  BUN 17 20  CREATININE 1.76* 1.95*  CALCIUM 9.4 8.4    Recent Labs  12/11/13 1000  AST 24  ALT 21    ALKPHOS 69  BILITOT 0.5  PROT 6.6  ALBUMIN 3.7    Recent Labs  12/11/13 1000 12/13/13 0330  WBC 7.7 8.2  NEUTROABS 4.7  --   HGB 13.4 12.0*  HCT 39.2 35.3*  MCV 87.3 88.7  PLT 227 228    Recent Labs  12/12/13 0501 12/12/13 1550 12/12/13 2130 12/13/13 0330  TROPONINI <0.30 2.11* 9.12* 9.17*   No components found with this basename: POCBNP,   Recent Labs  12/11/13 1600  HGBA1C 5.5     Radiology/Studies:  Dg Chest Portable 1 View  12/11/2013   CLINICAL DATA:  Chest and left arm pain  EXAM: PORTABLE CHEST - 1 VIEW  COMPARISON:  PA and lateral chest x-ray of May 08, 2011  FINDINGS: The lungs are mildly hyperinflated and clear. There is no pleural effusion. There are no abnormal pulmonary parenchymal nodules. Density previously described overlying the anterior aspect of the left second rib is less conspicuous today. The heart and mediastinal structures are normal. There is no pleural effusion. The bony thorax is unremarkable.  IMPRESSION: COPD.  There is no acute cardiopulmonary abnormality.   Electronically Signed   By: David  Martinique   On: 12/11/2013 10:05     Assessment and Plan  1. NSTEMI/CAD s/p DES to RCA 12/12/13 (likely acute on chronic occlusion); residual AV groove Cx is subtotally occluded but small -  med rx. Recommend dual antiplatelet therapy for at least one year (ASA + Brilinta). 2D Echo (12/12/13): technically difficult; mild LVH, EF 03-49%, grade 1 diastolic dysfunction, inferior HK, dilated IVC suggestive of elevated RA pressure. EKG nonacute this AM and patient is pain free. BP A bit soft throughout this admission thus has not been started on beta blocker - if we do, would probably chose something like bisoprolol for improved selectivity. With renal insufficiency at present time would not add so that we don't precipitate dropping BP further and diminishing renal perfusion. Cardiac rehab to see. Pt given unbiased option to f/u with Dr. Ubaldo Glassing with [facility] or our  office near [facility] - he wishes to follow up with [place]. We will arrange - I have left a message on our office's scheduling voicemail requesting a follow-up appointment, and our office will call the patient with this appointment. 2. CKD stage III - pt was reportedly taking lots of NSAIDS at home. We discussed avoidance of these  - primary team has discontinued these meds. Cr 1.66->1.76->cath->1.95. Continue to hold ACEI. May need to trend this for another day before we can safely discharge. 3. HTN - softer side this adm. Off antihypertensives. Follow. 4. COPD & former hx long tobacco - cont regimen. 5. Dyslipidemia - continue higher dose statin - will need f/u lipids/LFTs in about 6 weeks.  7. NSVT - possibly related to reperfusion. LVEF normal. K normal. Check Mg. Follow on tele.  Signed, Melina Copa PA-C  I have personally seen and examined this patient with Melina Copa, PA-C. I agree with the assessment and plan as outlined above. Pt admitted with NSTEMI. Cardiac cath yesterday with sub-total occlusion of the mid RCA, now s/p DES x 1 mid RCA. Doing well this am. Agree that he should be monitored for 24 more hours given worsening of renal function post cath. Also monitor on telemetry 24 more hours given non-sustained VT overnight.   MCALHANY,CHRISTOPHER 12/13/2013 7:10 AM

## 2013-12-13 NOTE — Progress Notes (Signed)
  PROGRESS NOTE MEDICINE TEACHING ATTENDING   Day 2 of stay Patient name: William Mueller   Medical record number: [id number] Date of birth: 07-29-1950    Seen and examined patient with team this morning. Appears to be comfortable and doing well. I have reviewed Dr Sandi Raveling note and discussed this patient's care with my team - Drs Sharlette Dense and Hulen Luster. We will continue to observe Mr Passage for signs of contrast nephropathy for another 24 hours at least. We will defer cardiac management decisions to cardiology. Blood work reviewed - troponins markedly trended down. Please see the resident note for details of care.   I have discussed the care of this patient with my IM team residents. Please see the resident note for details.  [place], [place] 12/13/2013, 1:24 PM.

## 2013-12-13 NOTE — Progress Notes (Addendum)
Subjective: Patient is sitting up in his chair, watching TV. States he feels much better today and has experienced no chest pain, SOB or nausea since yesterday's procedure. He would like his Symbicort this morning.   Objective: Vital signs in last 24 hours: Filed Vitals:   12/12/13 2000 12/12/13 2357 12/13/13 0048 12/13/13 0602  BP:  120/71  137/89  Pulse: 73 74  79  Temp:  99.1 F (37.3 C)  98.1 F (36.7 C)  TempSrc:  Oral  Oral  Resp:  18  20  Height:      Weight:   189 lb 6 oz (85.9 kg)   SpO2: 95% 96%  96%   Physical Exam: General: well developed, well nourished, looks much more comfortable and rested today HEENT: Mallampati score: 1, PERRL  Cardiac: distant heart sounds, RRR, normal S1S2, no M/R/G, no pain on chest palpation  Lungs: CTAB, no W/R/R  Abdomen: +BS, soft, nondistended, nontender  Extremities: bruising, especially on arms, no obvious LE edema  Neurological: CN II-XII intact, subtle tremor on arm extension  Psychiatric: much less anxious today  Weight change: 6 lb 15.6 oz (3.164 kg)  Intake/Output Summary (Last 24 hours) at 12/13/13 0733 Last data filed at 12/12/13 2300  Gross per 24 hour  Intake   1430 ml  Output    450 ml  Net    980 ml   Labs: Troponins were negative x3 following CP on day of admission, then negative x1 after CP yesterday, but 2.11/9.17/9.12 after cath yesterday into today (last 3:30AM)  Tele: nonsustained VT overnight (?related to reperfusion?)  Na 137 K 4.0 Cl 99 CO2 25 BUN 20 Cr 1.95<--cath--<1.76<--1.66 Ca 8.4 Glucose 91  WBC 8.2 H&H 12.0/35.3 Plts 228  Lipids Chol 201 Trig 131 HDL 52 LDL 123  Micro Results: No results found for this or any previous visit (from the past 240 hour(s)). Studies/Results: Dg Chest Portable 1 View  12/11/2013   CLINICAL DATA:  Chest and left arm pain  EXAM: PORTABLE CHEST - 1 VIEW  COMPARISON:  PA and lateral chest x-ray of May 08, 2011  FINDINGS: The lungs are mildly  hyperinflated and clear. There is no pleural effusion. There are no abnormal pulmonary parenchymal nodules. Density previously described overlying the anterior aspect of the left second rib is less conspicuous today. The heart and mediastinal structures are normal. There is no pleural effusion. The bony thorax is unremarkable.  IMPRESSION: COPD.  There is no acute cardiopulmonary abnormality.   Electronically Signed   By: David  Martinique   On: 12/11/2013 10:05   Cath Lab: [facility] IV Angina required left heart catheterization with PTCA and stenting of the mid RCA, 100% stenosis  Echo 7/8 post-catheterization: Impressions: - LVEF 55-60%, inferior hypokinesis, hyperdynamic anterior and lateral walls, mild LVH, normal LA size, dilated IVC suggestive of elevated RA pressure.   Medications: I have reviewed the patient's current medications. Scheduled Meds: . ALPRAZolam  3 mg Oral Daily  . aspirin EC  81 mg Oral Daily  . atorvastatin  80 mg Oral q1800  . budesonide-formoterol  1 puff Inhalation BID  . pantoprazole  40 mg Oral Daily  . roflumilast  500 mcg Oral Daily  . sodium chloride  3 mL Intravenous Q12H  . sodium chloride  3 mL Intravenous Q12H  . ticagrelor  90 mg Oral BID  . tiotropium  18 mcg Inhalation Daily   Continuous Infusions: . sodium chloride     PRN Meds:.sodium chloride, sodium chloride,  acetaminophen, albuterol, ALPRAZolam, ondansetron (ZOFRAN) IV, sodium chloride, sodium chloride Assessment/Plan: Principal Problem:   Chest pain with moderate risk of acute coronary syndrome Active Problems:   HTN (hypertension)   CKD (chronic kidney disease) stage 3, GFR 30-59 ml/min   Hyperlipidemia   COPD (chronic obstructive pulmonary disease)   Chest pain   Coronary atherosclerosis of native coronary artery  This 63 yo man with HTN, HLD, sleep apnea presented with symptoms concerning for ACS; however, he had negative troponins and EKGs. His TIMI II score was 2 (8% risk in 14 days all  cause mortality related to MI). His heart score was 4 (12-16% chance of MI in 6 weeks).  However, during his first night of hospitalization, he had chest pain and ST-depressions on EKG. He was treated with nitro. His BP dropped. He went for cath.  Though we expected to find a left sided blockage, instead he was found to have severe one-vessel coronary artery disease in the mid RCA (100% occluded); likely chronic and the cause of the patient's angina. A drug-eluting stent was placed. This may explain the patient's drop in BP after his nitro dosing (he was probably preload dependent). The AV groove from left circumflex was also found to be subtotally occluded,and may have been what we appreciated on EKG. It was too small to stent.  A mildly elevated LVEDP was also noted.  The patient has had no pain or tele events since the cath and his BPs have been more steady (fewer drops in BP), but his creatinine is trending up.  CAD: troponins trended up (2.11, 9.12, 9.17 after cath), but patient has been asymptomatic. On tele overnight, had non-sustained VT - dual antiplatelet therapy for at least one year - hydrating with NS - Echocardiogram to evaluate for LV systolic function showed EF 55-60% - if in chest pain give metoprolol 5mg  over 5 mins x3; only give nitro as secondary treatment - continue tele - f/u magnesium  Hypertension: patient states that BPs have been fluctuating at home; this may be corrected with the stent - trend BPs while in hospital; have been (107/61 to 120/71 since last night- much narrower range than prior to stent) - d/c lisinopril 10 mg BID home dose  - diet counseling  Hyperlipidemia:  - simvastatin 5 mg home dose  - diet counseling - needs f/u lipids/LFTs in 6 weeks  COPD:  - Continue home treatment (part of trial) on Daliresp 500 mcg  - Continue home treatment regimen with symbicort inhaler (1 puff BID), spiriva 18 mcg  - albuterol 2.5 mg PRN q6 hours   Anxiety:  -  alprazolam home dosing of 24 hour 3mg  daily and 1 mg PRN   Headaches: patient takes quite a lot of NSAIDs for headaches  - hold patient's BC Headache Powder  - hold patient's ibuprofen   BPH: it does not appear that this patient is taking anything for his reported urinary retention  - Bladder scan and in-and-out cath if retaining >200   CKD (stage III): creatinine 1.95, uptrending since cath - need more information about this diagnosis, baseline unknown - observe for 24 more hours given worsening renal fx post-cath  GERD/gastritis: has been taking his wife's omeprazole - start Protonix for trial of reflux management  - zofran PRN   ?Sleep Apnea: patient's wife reports snoring  - continuous pulse-ox tonight to monitor for apnea; will recommend sleep study at d/c   Diet: heart healthy diet   DVT ppx:  - heparin  injection 5,000 units   Dispo: Disposition is deferred at this time, awaiting improvement of current medical problems.  Anticipated discharge in approximately 0 day(s).   The patient does have a current PCP (Leonides Sake, MD) and does need an [facility] hospital follow-up appointment after discharge.  The patient does not have transportation limitations that hinder transportation to clinic appointments.  .Services Needed at time of discharge: Y = Yes, Blank = No PT:   OT:   RN:   Equipment:   Other:     LOS: 2 days   Drucilla Schmidt, MD 12/13/2013, 7:33 AM

## 2013-12-13 NOTE — Progress Notes (Signed)
Pt with 13 beats VT (00:41AM); BP= 120/71   ; pt was sleeping when VT happens. Pt awakens no  c/o of chest pain or any discomfort. Troponin= 9.12. Dr. Delane Ginger informed. Will continue to monitor pt.

## 2013-12-13 NOTE — Progress Notes (Signed)
CARDIAC REHAB PHASE I   PRE:  Rate/Rhythm: 74 SR  BP:  Supine:148/84   Sitting:   Standing:    SaO2:   MODE:  Ambulation: 1000 ft   POST:  Rate/Rhythm: 111 ST  BP:  Supine:   Sitting: 177/99  Standing:    SaO2:  0800-0930 Pt tolerated ambulation well without c/o of cp. He s/o of some SOB with walking but had not had his inhalers this am. He was able to walk 1000 feet without any rest stops and not overtly SOB. Pt to recliner after walk with call light in reach and wife present. Completed MI and stent education with pt. He voices understanding. Pt agrees to [place]. [facility] in [place],will send referral. Pt is not smoking cigarettes, but he is using e-cigarette with nicotine. I have encouraged him to try to stop using that or to continue to wean off of that to he is no longer using it.   Rodney Langton RN 12/13/2013 9:29 AM

## 2013-12-13 NOTE — Telephone Encounter (Signed)
l mom for pt to schedule f/u from [facility], TCM, 1 week with DR. Fletcher Anon

## 2013-12-13 NOTE — Progress Notes (Signed)
Subjective: [name] is a 63 year old male with a history of hypertension, hyperlipidemia, anxiety, COPD, and CKD who presented two days ago with chest pain that was concerning for ACS. Initial EKG and serum troponin screens were negative, but EKG obtained during a second anginal episode revealed significant ST depressions and T-wave inversions in leads V4-V6 as well as AVL and prompted cardiac catheterization and stent placement in RCA.   Overnight, patient slept well. He states he feels fine and but would "feel even better at home." Patient denies any chest pain, shortness of breath, nausea, vomiting, or abdominal pain since catheterization and stent placement. He did make a point to state he would like his Symbicort and Spiriva this morning.    Objective: Vital signs in last 24 hours: Filed Vitals:   12/13/13 0048 12/13/13 0602 12/13/13 0812 12/13/13 1100  BP:  137/89 148/84 144/71  Pulse:  79 77 71  Temp:  98.1 F (36.7 C) 98.2 F (36.8 C) 98.3 F (36.8 C)  TempSrc:  Oral Oral Oral  Resp:  20 20 18   Height:      Weight: 85.9 kg (189 lb 6 oz)     SpO2:  96% 96% 96%   Weight change: 3.164 kg (6 lb 15.6 oz)  Intake/Output Summary (Last 24 hours) at 12/13/13 1243 Last data filed at 12/13/13 1222  Gross per 24 hour  Intake   1790 ml  Output   1275 ml  Net    515 ml   Physical Exam:  Heart:RRR, Distant heart sounds secondary to ascultation.  Lungs: No increase work of breathing, lungs clear to auscultation, Some minor wheezing noted on expiration.   Abdomen: Non distended, no scars, normoactive bowel sounds, non-tender to palpation.  Exteremities: No clubbing, cyanosis or edema;  Vascular: Distal pulses intact bilaterally, 2+.  Skin: normal texture, normal turgor, warm, dry, no rash.   Lab Results: Basic Metabolic Panel:  Recent Labs Lab 12/12/13 0501 12/13/13 0300 12/13/13 0330  NA 142  --  137  K 4.3  --  4.0  CL 100  --  99  CO2 24  --  25  GLUCOSE 94  --  91    BUN 17  --  20  CREATININE 1.76*  --  1.95*  CALCIUM 9.4  --  8.4  MG  --  2.1  --    Liver Function Tests:  Recent Labs Lab 12/11/13 1000  AST 24  ALT 21  ALKPHOS 69  BILITOT 0.5  PROT 6.6  ALBUMIN 3.7   CBC:  Recent Labs Lab 12/11/13 1000 12/13/13 0330  WBC 7.7 8.2  NEUTROABS 4.7  --   HGB 13.4 12.0*  HCT 39.2 35.3*  MCV 87.3 88.7  PLT 227 228   Cardiac Enzymes:  Recent Labs Lab 12/12/13 1550 12/12/13 2130 12/13/13 0330  TROPONINI 2.11* 9.12* 9.17*   Hemoglobin A1C:  Recent Labs Lab 12/11/13 1600  HGBA1C 5.5   Fasting Lipid Panel:  Recent Labs Lab 12/12/13 0501  CHOL 201*  HDL 52  LDLCALC 123*  TRIG 131  CHOLHDL 3.9   Thyroid Function Tests:  Recent Labs Lab 12/11/13 1600  TSH 0.496    Studies/Results: Telemetry: Overnight patient had an episode of non-sustained ventricular tachychardia.  Echocardiogram: showed LVEF of 55-60% and some inferior wall hypokinesis.   Medications: I have reviewed the patient's current medications. Scheduled Meds: . ALPRAZolam  3 mg Oral Daily  . aspirin EC  81 mg Oral Daily  .  atorvastatin  80 mg Oral q1800  . budesonide-formoterol  1 puff Inhalation BID  . pantoprazole  40 mg Oral Daily  . roflumilast  500 mcg Oral Daily  . sodium chloride  3 mL Intravenous Q12H  . sodium chloride  3 mL Intravenous Q12H  . ticagrelor  90 mg Oral BID  . tiotropium  18 mcg Inhalation Daily   Continuous Infusions: . sodium chloride 75 mL/hr at 12/13/13 1135   PRN Meds:.sodium chloride, sodium chloride, acetaminophen, albuterol, ALPRAZolam, ondansetron (ZOFRAN) IV, sodium chloride, sodium chloride Assessment/Plan: [name] is a 63 year old male with a history of hypertension, hyperlipidemia, anxiety, COPD, and CKD who presented two days ago with chest pain that was concerning for ACS. Initial EKG and serum troponin screens were negative, but EKG obtained during a second anginal episode revealed significant ST  depressions and T-wave inversions in leads V4-V6 as well as AVL and prompted cardiac catheterization and stent placement in RCA.   Principal Problem:   Chest pain with moderate risk of acute coronary syndrome Active Problems:   HTN (hypertension)   CKD (chronic kidney disease) stage 3, GFR 30-59 ml/min   Hyperlipidemia   COPD (chronic obstructive pulmonary disease)   Chest pain   Coronary atherosclerosis of native coronary artery  Chest Pain: Though patient feels much better and denies any chest pain post cath, there is some concern regarding his serum creatinine levels which are up-trending. Also of note, patient experienced an episode of non-sustained ventricular tachycardia last night.  Current plan includes:  - Continue to administer IV fluids and monitor creatinine levels.  - Continue dual antiplatelet therapy as per cardiology recommendations for a duration of at least on year.  -  Continue to monitor via telemetry overnight. -Maintain low sodium diet  -Patient will be advised on the importance of weight loss, and the benefits of such lifestyle modifications as they pertain to his condition.   Hypertension: Patient does report some blood pressure fluctuations from day to day, will continue to monitor throughout his stay and adjust home medication regimen as needed.   CKD: Creatinine levels up-trending post-cath. Current level at 1.95. Baseline uknown.  -Will contact PCP to obtain base levels.  -Continue to monitor creatinine levels over the next 24 hours.   Hyperlipidemia: Lipid panel showed elevated cholesterol and LDL, will continue to monitor levels throughout stay and adjust home medications as needed.    COPD: Well maintained with current regimen, continue home medications.    This is a Careers information officer Note.  The care of the patient was discussed with Dr. Sherrine Maples and the assessment and plan formulated with their assistance.  Please see their attached note for official  documentation of the daily encounter.   LOS: 2 days   Gar Gibbon, Med Student 12/13/2013, 12:43 PM

## 2013-12-13 NOTE — Progress Notes (Signed)
  I have seen and examined the patient myself, and I have reviewed the note by Dustin Rea, MS 3 and was present during the interview and physical exam.  Please see my separate H&P for additional findings, assessment, and plan.   Signed: Antero Derosia, MD 12/13/2013, 6:11 PM     

## 2013-12-14 DIAGNOSIS — I10 Essential (primary) hypertension: Secondary | ICD-10-CM

## 2013-12-14 DIAGNOSIS — I214 Non-ST elevation (NSTEMI) myocardial infarction: Secondary | ICD-10-CM

## 2013-12-14 DIAGNOSIS — N179 Acute kidney failure, unspecified: Secondary | ICD-10-CM

## 2013-12-14 DIAGNOSIS — I471 Supraventricular tachycardia: Secondary | ICD-10-CM

## 2013-12-14 LAB — BASIC METABOLIC PANEL
Anion gap: 15 (ref 5–15)
BUN: 16 mg/dL (ref 6–23)
CHLORIDE: 100 meq/L (ref 96–112)
CO2: 24 mEq/L (ref 19–32)
Calcium: 8.9 mg/dL (ref 8.4–10.5)
Creatinine, Ser: 1.78 mg/dL — ABNORMAL HIGH (ref 0.50–1.35)
GFR calc Af Amer: 45 mL/min — ABNORMAL LOW (ref >90)
GFR calc non Af Amer: 39 mL/min — ABNORMAL LOW (ref >90)
GLUCOSE: 85 mg/dL (ref 70–99)
POTASSIUM: 3.6 meq/L — AB (ref 3.7–5.3)
Sodium: 139 mEq/L (ref 137–147)

## 2013-12-14 LAB — CBC
HCT: 37 % — ABNORMAL LOW (ref 39.0–52.0)
HEMOGLOBIN: 12.7 g/dL — AB (ref 13.0–17.0)
MCH: 30 pg (ref 26.0–34.0)
MCHC: 34.3 g/dL (ref 30.0–36.0)
MCV: 87.5 fL (ref 78.0–100.0)
Platelets: 232 10*3/uL (ref 150–400)
RBC: 4.23 MIL/uL (ref 4.22–5.81)
RDW: 12.3 % (ref 11.5–15.5)
WBC: 7.5 10*3/uL (ref 4.0–10.5)

## 2013-12-14 MED ORDER — TICAGRELOR 90 MG PO TABS: 90.0000 mg | ORAL_TABLET | Freq: Two times a day (BID) | ORAL | Status: DC

## 2013-12-14 MED ORDER — METOPROLOL SUCCINATE 12.5 MG HALF TABLET: 12.5000 mg | ORAL_TABLET | Freq: Every day | ORAL | Status: DC

## 2013-12-14 MED ORDER — POTASSIUM CHLORIDE CRYS ER 20 MEQ PO TBCR
40.0000 meq | EXTENDED_RELEASE_TABLET | Freq: Once | ORAL | Status: AC
  Administered 2013-12-14: 40 meq via ORAL
  Filled 2013-12-14: qty 2

## 2013-12-14 MED ORDER — ASPIRIN 81 MG PO TBEC: 81.0000 mg | DELAYED_RELEASE_TABLET | Freq: Every day | ORAL | Status: DC

## 2013-12-14 MED ORDER — METOPROLOL SUCCINATE 12.5 MG HALF TABLET
12.5000 mg | ORAL_TABLET | Freq: Every day | ORAL | Status: DC
  Administered 2013-12-14: 12.5 mg via ORAL
  Filled 2013-12-14: qty 1

## 2013-12-14 NOTE — Progress Notes (Signed)
Subjective: Overnight, patient slept well. At the time of our exam he was sitting upright and watching TV. Denies any chest pain, shortness of breath, nausea or vomiting. Has no new complaints, or concerns. States he is more than ready to leave.   Objective: Vital signs in last 24 hours: Filed Vitals:   12/14/13 0014 12/14/13 0403 12/14/13 0740 12/14/13 1224  BP: 137/76 127/77 142/90 130/69  Pulse: 71 81 84 80  Temp: 98.4 F (36.9 C) 98.2 F (36.8 C) 98.1 F (36.7 C) 98.2 F (36.8 C)  TempSrc: Oral Oral Oral Oral  Resp: 18 18 18 18   Height:      Weight: 85.9 kg (189 lb 6 oz)     SpO2: 96% 97% 97% 96%   Weight change: 0 kg (0 lb)  Intake/Output Summary (Last 24 hours) at 12/14/13 1433 Last data filed at 12/14/13 1337  Gross per 24 hour  Intake 1616.25 ml  Output   2250 ml  Net -633.75 ml   Physical Exam:  Heart: RRR, S1 & S2 with no s3 or s4, no murmurs, rubs or gallops heard on ascultation.  Lungs: No increased work of breathing, lungs clear to auscultation bilaterally, no wheezes or crackles.  Exteremities: No edema noted in distal extremities, bilaterally.   Skin: normal texture, normal turgor, warm, dry, no rash.   Lab Results: Basic Metabolic Panel:  Recent Labs Lab 12/12/13 0501 12/13/13 0300 12/13/13 0330 12/14/13 0429  NA 142  --  137 139  K 4.3  --  4.0 3.6*  CL 100  --  99 100  CO2 24  --  25 24  GLUCOSE 94  --  91 85  BUN 17  --  20 16  CREATININE 1.76*  --  1.95* 1.78*  CALCIUM 9.4  --  8.4 8.9  MG  --  2.1  --   --    Liver Function Tests:  Recent Labs Lab 12/11/13 1000  AST 24  ALT 21  ALKPHOS 69  BILITOT 0.5  PROT 6.6  ALBUMIN 3.7   CBC:  Recent Labs Lab 12/11/13 1000 12/13/13 0330 12/14/13 0429  WBC 7.7 8.2 7.5  NEUTROABS 4.7  --   --   HGB 13.4 12.0* 12.7*  HCT 39.2 35.3* 37.0*  MCV 87.3 88.7 87.5  PLT 227 228 232   Cardiac Enzymes:  Recent Labs Lab 12/12/13 1550 12/12/13 2130 12/13/13 0330  TROPONINI 2.11* 9.12*  9.17*   Hemoglobin A1C:  Recent Labs Lab 12/11/13 1600  HGBA1C 5.5   Fasting Lipid Panel:  Recent Labs Lab 12/12/13 0501  CHOL 201*  HDL 52  LDLCALC 123*  TRIG 131  CHOLHDL 3.9   Thyroid Function Tests:  Recent Labs Lab 12/11/13 1600  TSH 0.496   Medications: I have reviewed the patient's current medications. Scheduled Meds: . ALPRAZolam  1 mg Oral TID  . aspirin EC  81 mg Oral Daily  . atorvastatin  80 mg Oral q1800  . budesonide-formoterol  1 puff Inhalation BID  . metoprolol succinate  12.5 mg Oral Daily  . pantoprazole  40 mg Oral Daily  . roflumilast  500 mcg Oral Daily  . sodium chloride  3 mL Intravenous Q12H  . sodium chloride  3 mL Intravenous Q12H  . ticagrelor  90 mg Oral BID  . tiotropium  18 mcg Inhalation Daily   Continuous Infusions:  PRN Meds:.sodium chloride, sodium chloride, acetaminophen, albuterol, ALPRAZolam, ondansetron (ZOFRAN) IV, sodium chloride, sodium chloride Assessment/Plan: Principal Problem:  NSTEMI (non-ST elevated myocardial infarction) Active Problems:   HTN (hypertension)   Acute renal failure superimposed on stage 3 chronic kidney disease   Hyperlipidemia   COPD (chronic obstructive pulmonary disease)   Coronary atherosclerosis of native coronary artery   Atrial tachycardia, paroxysmal  Mr. Sinning is a 63 year-old male with a past medical history of hypertension, hyperlipidemia, anxiety, COPD, and CKD (stage 3) who presented to the ED two days ago with substernal chest pain, radiation to the left arm and shoulder with numbness concerning for ACS despite initially negative EKG and troponin screens X3. Another EKG, however, was obtained during a second anginal episode that was found to be abnormal with significant ST depressions and T-wave inversions in leads V4-V6 as well as AVL. Patient underwent cardiac catheterization which revealed near-total occlusion of the RCA, resulting in placement of drug eluting stent.   NSTEMI:  Patient still denies any chest pain post cath and stent placement despite up-trending troponin levels. Telemetry has noted Non-sustained ventricular tachycardia the night following the procedure, as well as 4-5 beat run of possible paroxysmal atrial tachycardia.  Plan Includes:  -- Continue to monitor through telemetry.  --Will prescribe low-dose B1 selective Beta blocker --Continue dual anti-platelet therapy --Continue to administer IV fluids  -- Patient was advised regarding importance of a cardiac diet and weight loss.   Hypertension: Blood pressure has fluctuated over course of stay, and BP medications were withheld due to SBP of 90's for 3 days. However, current SBP >150. Will continue home medications along with low-dose BB mentioned above.     Hyperlipidemia: Patient has been placed on statin. Will follow up in outpatient setting.   Acute renal failure superimposed on stage 3 chronic kidney disease: creatinine levels finally down-trending following catheterization and stent placement. Current creatinine listed as 1.78 (down from 1.95, the following day.) Will arrange for nephrology follow-up in the outpatient setting.   COPD: Patient reports no shortness of breath. Lungs clear to auscultation bilaterally, no wheezing noted. Continue home medications.   Dispo: Awaiting improvement of current medical problems. Patient expected to be discharged later this evening.    This is a Careers information officer Note.  The care of the patient was discussed with Dr. Sherrine Maples and the assessment and plan formulated with their assistance.  Please see their attached note for official documentation of the daily encounter.   LOS: 3 days   Gar Gibbon, Med Student 12/14/2013, 2:33 PM

## 2013-12-14 NOTE — Discharge Summary (Signed)
Name: William Mueller MRN: [id number] DOB: 07-28-1950 63 y.o. PCP: Leonides Sake, MD  Date of Admission: 12/11/2013  8:52 AM Date of Discharge: 12/14/2013 Attending Physician: Karren Cobble, MD  Discharge Diagnosis: Principal Problem:   NSTEMI (non-ST elevated myocardial infarction) Active Problems:   HTN (hypertension)   Acute renal failure superimposed on stage 3 chronic kidney disease   Hyperlipidemia   COPD (chronic obstructive pulmonary disease)   Coronary atherosclerosis of native coronary artery   Atrial tachycardia, paroxysmal  Discharge Medications:   Medication List    STOP taking these medications       BC HEADACHE POWDER PO     ibuprofen 200 MG tablet  Commonly known as:  ADVIL,MOTRIN      TAKE these medications       albuterol 108 (90 BASE) MCG/ACT inhaler  Commonly known as:  PROVENTIL HFA;VENTOLIN HFA  Inhale 1-2 puffs into the lungs every 6 (six) hours as needed for wheezing or shortness of breath.     ALPRAZolam 3 MG 24 hr tablet  Commonly known as:  XANAX XR  Take 3 mg by mouth daily. For anxiety control     ALPRAZolam 1 MG tablet  Commonly known as:  XANAX  Take 1 mg by mouth daily as needed. For breakthrough anxiety     aspirin 81 MG EC tablet  Take 1 tablet (81 mg total) by mouth daily.     DALIRESP 500 MCG Tabs tablet  Generic drug:  roflumilast  Take 500 mcg by mouth daily.     fenofibrate 145 MG tablet  Commonly known as:  TRICOR  Take 145 mg by mouth daily.     lisinopril 10 MG tablet  Commonly known as:  PRINIVIL,ZESTRIL  Take 10 mg by mouth 2 (two) times daily.     metoprolol succinate 12.5 mg Tb24 24 hr tablet  Commonly known as:  TOPROL-XL  Take 0.5 tablets (12.5 mg total) by mouth daily.     NITROSTAT 0.4 MG SL tablet  Generic drug:  nitroGLYCERIN  Place 0.4 mg under the tongue every 5 (five) minutes as needed. If 3rd tablet needed call 911     pravastatin 10 MG tablet  Commonly known as:  PRAVACHOL  Take 10 mg by  mouth at bedtime.     SPIRIVA HANDIHALER 18 MCG inhalation capsule  Generic drug:  tiotropium  Place 18 mcg into inhaler and inhale daily.     SYMBICORT 160-4.5 MCG/ACT inhaler  Generic drug:  budesonide-formoterol  Inhale 1 puff into the lungs 2 (two) times daily.     ticagrelor 90 MG Tabs tablet  Commonly known as:  BRILINTA  Take 1 tablet (90 mg total) by mouth 2 (two) times daily.        Disposition and follow-up:   Mr.William Mueller was discharged from [facility] in Stable condition.  At the hospital follow up visit please address:  1.  New medications during hospitalization (Toprol XL and dual antiplatelet therapy on aspirin and brilinta for at least a year). The beta blocker was added due to his high DBP post-cath, but perhaps also be mindful of the patient's risk of decreased kidney perfusion with his CKD. The patient's lisinopril was stopped during hospitalization, considering his kidney function and drops in BP prior to cath. Please reassess and consider restarting at a later date if toprol is not adequate and kidney function stable. The patient's diastolic BP ran high post-cath (occasionally in the 90s).  Patient reports taking multiple NSAIDs at home and was advised against this (considering his CKD stage III and his symptoms of GERD). Patient also reports taking his wife's omeprazole (we did not prescribe this for him on this hospitalization, as GERD was not the cause of his chest pain on this admission, but please evaluate and consider). Also, please consider sleep study in this patient (wife says he is an avid snorer). Finally, please resume diet counseling was started in the hospital.  2.  Labs / imaging needed at time of follow-up: Patient needs lipids/LFTs in 6 weeks  3.  Pending labs/ test needing follow-up: none  Follow-up Appointments: Follow-up Information   Follow up with Ida Rogue, MD On 12/20/2013. (at 2:00 pm)    Specialty:  Cardiology    Contact information:   [address] [place] [address] [phone]       Follow up with Leonides Sake, MD On 12/21/2013. (11:15 AM )    Specialty:  Family Medicine   Contact information:   Dr. Daiva Eves [address] [place] [address] [phone]       Discharge Instructions: Discharge Instructions   Amb Referral to Cardiac Rehabilitation    Complete by:  As directed   Pt agrees to [name]. CRP in [place], will send referral.           Consultations: Treatment Team:  Rounding Lbcardiology, MD  Procedures Performed:  Dg Chest Portable 1 View  12/11/2013   CLINICAL DATA:  Chest and left arm pain  EXAM: PORTABLE CHEST - 1 VIEW  COMPARISON:  PA and lateral chest x-ray of May 08, 2011  FINDINGS: The lungs are mildly hyperinflated and clear. There is no pleural effusion. There are no abnormal pulmonary parenchymal nodules. Density previously described overlying the anterior aspect of the left second rib is less conspicuous today. The heart and mediastinal structures are normal. There is no pleural effusion. The bony thorax is unremarkable.  IMPRESSION: COPD.  There is no acute cardiopulmonary abnormality.   Electronically Signed   By: David  Martinique   On: 12/11/2013 10:05    2D Echo: Study Conclusions  - Procedure narrative: Transthoracic echocardiography. Image quality was adequate. The study was technically difficult due to variations in respirations.  - Left ventricle: The cavity size was normal. Wall thickness wasincreased in a pattern of mild LVH. Systolic function was normal. The estimated ejection fraction was in the range of 55% to 60%. Inferior hypokinesis. Doppler parameters are consistent with abnormal left ventricular relaxation (grade 1 diastolic dysfunction). The E/e&' ratio is between 8-15, suggesting indeterminate LV Filling pressure. - Left atrium: The atrium was normal in size. - Inferior vena cava: The vessel was dilated. The  respirophasic diameter changes were blunted (< 50%), consistent with elevated central venous pressure.  Impressions:  - LVEF 55-60%, inferior hypokinesis, hyperdynamic anterior and lateral walls, mild LVH, normal LA size, dilated IVC suggestive of elevated RA pressure.  Cardiac Cath: Final Conclusions:  1. Severe one-vessel coronary artery disease with an occluded mid right coronary artery. This is likely acute on chronic and is likely the culprit for patient's presentation. The AV groove artery from the left circumflex is subtotally occluded but that's very small.  2. Mildly elevated left ventricular end-diastolic pressure.  3. Successful angioplasty and drug-eluting stent placement to the right coronary artery.   Admission HPI: Mr. Eggebrecht is a 63 yo man with a history of hypertension, hyperlipidemia, COPD (he is part of a roflumilast treatment trial),  anxiety, BPH and CKD (stage III) who noticed chest pain and shortness of breath while drinking coffee on his deck this morning. The pain spanned his entire chest. It was "exploding" in nature, 10/10 in intensity and was accompanied by left arm numbness and diaphoresis. He noticed that the pain was worse on deep breath. He took some nitroglycerin and the pain subsided in 5 minutes. Though the patient has never experienced chest pain as intense as this, patient has had chest pain in the past. In fact, over the past few weeks, he has experienced pain and SOB while mowing the lawn, which he has been treating with nitroglycerin. He has also has noticed more fluctuation in his blood pressure than normal over the past 2 weeks (90/60 to 160/97, as measured by him). He had a stress test one month ago, which was negative and a heart catheterization in 1993 which showed no blockages. He does suffer from anxiety, but denies recent added stressors or anxiety above his baseline. His wife confirms that he is an avid "snorer". He has headaches daily, often in the morning,  for which he takes [facility] Goody's and ibuprofen (several pills per day). He does admit to chest pain following meals, particularly those that are fatty. He had not eaten anything this morning prior to the onset of pain. He also denies recent trauma or respiratory illness. After he took his nitroglycerin, he traveled to [place], where he received an EKG in the ambulance bay.   Hospital Course by problem list: Principal Problem:   NSTEMI (non-ST elevated myocardial infarction) Active Problems:   HTN (hypertension)   Acute renal failure superimposed on stage 3 chronic kidney disease   Hyperlipidemia   COPD (chronic obstructive pulmonary disease)   Coronary atherosclerosis of native coronary artery   Atrial tachycardia, paroxysmal   In summary, on this hospitalization, this 63 yo man with HTN, HLD, sleep apnea presented with symptoms concerning for ACS; however, he had negative troponins and EKGs. His TIMI II score was 2 (8% risk in 14 days all cause mortality related to MI). His heart score was 4 (12-16% chance of MI in 6 weeks).   CAD/NSTEMI:  During his first night of hospitalization, suffered an NSTEMI (EKG showed lateral ST depressions suggestive of left sided blockage). He was treated with nitro. Low BPs were noted after this treatment. He went for cath.  Though we expected to find left sided blockage, instead he was found to have severe one-vessel coronary artery disease in the mid RCA (100% occluded); likely chronic and the cause of the patient's angina. A Xience drug-eluting stent was placed. The RCA location of the blockage may explain the patient's drop in BP after his nitro dosing (preload dependence).  The AV groove from left circumflex was also found to be subtotally occluded, and may have been what we appreciated on EKG. It was too small to stent.  A mildly elevated LVEDP was also noted on post-cath echo.  Events on telemetry post-cath: had one episode of non-sustained VT and 4-5 beat run  of paroxysmal atrial tachy  The patient had a brief increase in his creatinine post-cath, so he was kept a solid 48 hours to trend. The creatinine fell to pre-cath levels, and the patient was discharged home.  - he was started on dual antiplatelet therapy which should be continued for at least one year (aspirin and brilinta)  Hypertension: patient states that BPs have been fluctuating at home; this may be corrected with the stent. Diet  counseling was given. - lisinopril 10 mg BID home dose was stopped during this hospitalization due to drops in BP and kidney function - Toprol XL 12.5 was added given that his DBP ran high  Hyperlipidemia: diet counseling was given - simvastatin 5 mg home dose  - needs f/u lipids/LFTs in 6 weeks   COPD:  - Continued home treatment (part of trial) on Daliresp 500 mcg  - Continued home treatment regimen with symbicort inhaler (1 puff BID), spiriva 18 mcg  - Had albuterol 2.5 mg PRN q6 hours   Anxiety:  - alprazolam home dosing of 24 hour 3mg  daily and 1 mg PRN   Headaches: patient takes quite a lot of NSAIDs for headaches  - hold patient's BC Headache Powder  - hold patient's ibuprofen   BPH: it does not appear that this patient is taking anything for his reported urinary retention   CKD (stage III): monitored creatinine, which briefly rose post-cath - baseline unknown   GERD/gastritis: has been taking his wife's omeprazole  - started Protonix for trial of reflux management   ?Sleep Apnea: patient's wife reports snoring  - considered sleep study, but did not perform in light of more pressing issues on this admission  Discharge Vitals:   BP 142/90  Pulse 84  Temp(Src) 98.1 F (36.7 C) (Oral)  Resp 18  Ht 5\' 9"  (1.753 m)  Wt 189 lb 6 oz (85.9 kg)  BMI 27.95 kg/m2  SpO2 97%  Discharge Labs:  Results for orders placed during the hospital encounter of 12/11/13 (from the past 24 hour(s))  CBC     Status: Abnormal   Collection Time    12/14/13   4:29 AM      Result Value Ref Range   WBC 7.5  4.0 - 10.5 K/uL   RBC 4.23  4.22 - 5.81 MIL/uL   Hemoglobin 12.7 (*) 13.0 - 17.0 g/dL   HCT 37.0 (*) 39.0 - 52.0 %   MCV 87.5  78.0 - 100.0 fL   MCH 30.0  26.0 - 34.0 pg   MCHC 34.3  30.0 - 36.0 g/dL   RDW 12.3  11.5 - 15.5 %   Platelets 232  150 - 400 K/uL  BASIC METABOLIC PANEL     Status: Abnormal   Collection Time    12/14/13  4:29 AM      Result Value Ref Range   Sodium 139  137 - 147 mEq/L   Potassium 3.6 (*) 3.7 - 5.3 mEq/L   Chloride 100  96 - 112 mEq/L   CO2 24  19 - 32 mEq/L   Glucose, Bld 85  70 - 99 mg/dL   BUN 16  6 - 23 mg/dL   Creatinine, Ser 1.78 (*) 0.50 - 1.35 mg/dL   Calcium 8.9  8.4 - 10.5 mg/dL   GFR calc non Af Amer 39 (*) >90 mL/min   GFR calc Af Amer 45 (*) >90 mL/min   Anion gap 15  5 - 15    Signed: Drucilla Schmidt, MD 12/14/2013, 12:24 PM    Services Ordered on Discharge: none Equipment Ordered on Discharge: none

## 2013-12-14 NOTE — Progress Notes (Signed)
Patient Name: William Mueller Date of Encounter: 12/14/2013  Principal Problem:   NSTEMI (non-ST elevated myocardial infarction) Active Problems:   HTN (hypertension)   Acute renal failure superimposed on stage 3 chronic kidney disease   Hyperlipidemia   COPD (chronic obstructive pulmonary disease)   Coronary atherosclerosis of native coronary artery    Patient Profile: 63 yo male w/ hx HTN, dyslipidemia, and COPD, but no hx CAD. Admitted 07/07 w/ chest pain, NSTEMI. Cath 07/08 w/ 2.75 x 28 mm Xience drug-eluting stent to the RCA, med rx for small subtotal AV CFX. EF 55-60% by echo. Had NSVT night of 07/08, no sx.   SUBJECTIVE:  No chest pain, no SOB. Says BP runs 110s-120s at home but had some fluctuations PTA.  OBJECTIVE Filed Vitals:   12/13/13 2002 12/13/13 2111 12/14/13 0014 12/14/13 0403  BP: 135/77  137/76 127/77  Pulse: 81  71 81  Temp: 98.8 F (37.1 C)  98.4 F (36.9 C) 98.2 F (36.8 C)  TempSrc: Oral  Oral Oral  Resp: 18  18 18   Height:      Weight:   189 lb 6 oz (85.9 kg)   SpO2: 96% 95% 96% 97%    Intake/Output Summary (Last 24 hours) at 12/14/13 0645 Last data filed at 12/14/13 0404  Gross per 24 hour  Intake 1596.25 ml  Output   2825 ml  Net -1228.75 ml   Filed Weights   12/12/13 0432 12/13/13 0048 12/14/13 0014  Weight: 182 lb 6.4 oz (82.736 kg) 189 lb 6 oz (85.9 kg) 189 lb 6 oz (85.9 kg)    PHYSICAL EXAM General: Well developed, well nourished, male in no acute distress. Head: Normocephalic, atraumatic.  Neck: Supple without bruits, JVD not elevated. Lungs:  Resp regular and unlabored, dry rales Heart: RRR, S1, S2, no S3, S4, or murmur; no rub. Abdomen: Soft, non-tender, non-distended, BS + x 4.  Extremities: No clubbing, cyanosis, no edema.  Neuro: Alert and oriented X 3. Moves all extremities spontaneously. Psych: Normal affect.  LABS: CBC: Recent Labs  12/11/13 1000 12/13/13 0330 12/14/13 0429  WBC 7.7 8.2 7.5  NEUTROABS 4.7   --   --   HGB 13.4 12.0* 12.7*  HCT 39.2 35.3* 37.0*  MCV 87.3 88.7 87.5  PLT 227 228 161   Basic Metabolic Panel: Recent Labs  12/12/13 0501 12/13/13 0300 12/13/13 0330 12/14/13 0429  NA 142  --  137 139  K 4.3  --  4.0 3.6*  CL 100  --  99 100  CO2 24  --  25 24  GLUCOSE 94  --  91 85  BUN 17  --  20 16  CREATININE 1.76*  --  1.95* 1.78*  CALCIUM 9.4  --  8.4 8.9  MG  --  2.1  --   --    Liver Function Tests: Recent Labs  12/11/13 1000  AST 24  ALT 21  ALKPHOS 69  BILITOT 0.5  PROT 6.6  ALBUMIN 3.7   Cardiac Enzymes: Recent Labs  12/12/13 1550 12/12/13 2130 12/13/13 0330  TROPONINI 2.11* 9.12* 9.17*   Hemoglobin A1C: Recent Labs  12/11/13 1600  HGBA1C 5.5   Fasting Lipid Panel: Recent Labs  12/12/13 0501  CHOL 201*  HDL 52  LDLCALC 123*  TRIG 131  CHOLHDL 3.9   Thyroid Function Tests: Recent Labs  12/11/13 1600  TSH 0.496   TELE:  SR, ST, brief run ?PAT  Current Medications:  . ALPRAZolam  1 mg Oral TID  . aspirin EC  81 mg Oral Daily  . atorvastatin  80 mg Oral q1800  . budesonide-formoterol  1 puff Inhalation BID  . pantoprazole  40 mg Oral Daily  . roflumilast  500 mcg Oral Daily  . sodium chloride  3 mL Intravenous Q12H  . sodium chloride  3 mL Intravenous Q12H  . ticagrelor  90 mg Oral BID  . tiotropium  18 mcg Inhalation Daily      ASSESSMENT AND PLAN: Principal Problem:   NSTEMI (non-ST elevated myocardial infarction) - s/p 2.75 x 28 mm Xience drug-eluting stent to the RCA. On ASA, statin, not on BB because BP was 90s. MD advise on trying to add low-dose B1 selective med.    Active Problems:   HTN (hypertension) - per his reports, BP well-controlled at home till shortly before admit (held BP rx for 3 days due to SBP 90s and then SBP > 150).    Acute renal failure superimposed on stage 3 chronic kidney disease - Cr trending down finally after cath, per IM    Hyperlipidemia - now on statin, per IM    COPD (chronic  obstructive pulmonary disease) - per IM, no wheezing now    Coronary atherosclerosis of native coronary artery - see above.    NSVT - had a brief run post-cath, none overnight, but had 4-5 bt run possible PAT, MD advise on trying BB.  Jonetta Speak , PA-C 6:45 AM 12/14/2013

## 2013-12-14 NOTE — Progress Notes (Signed)
Internal Medicine Attending  Date: 12/14/2013  Patient name: William Mueller Medical record number: [id number] Date of birth: 06-08-1950 Age: 63 y.o. Gender: male  I saw and evaluated the patient. I developed the assessment and plan with the housestaff and reviewed the resident's note by Dr. Sherrine Maples.  I agree with the resident's findings and plans as documented in her progress note.  William Mueller was without any chest pain this morning. He is anxious to get home. Fortunately, his creatinine has returned to the admission baseline of 1.78. I therefore agree with discharge home with followup as scheduled in cardiology. He will be maintained on beta blockade and dual antiplatelet therapy for his new right coronary artery drug-eluting stent.

## 2013-12-14 NOTE — Progress Notes (Signed)
CARDIAC REHAB PHASE I   PRE:  Rate/Rhythm: 82 SR  BP:  Supine:   Sitting: 133/92  Standing:    SaO2: 97 RA  MODE:  Ambulation: 1000 ft   POST:  Rate/Rhythm: 116 ST  BP:  Supine:   Sitting: 146/76  Standing:    SaO2: 94-97 RA [id number] Pt tolerated ambulation well without c/o of cp or SOB. He states that he feels stronger today. Monitored room air sat during walk 94-97%. He denies any SOB with walking today. Answered some questions that pt's wife had.   Rodney Langton RN 12/14/2013 8:52 AM

## 2013-12-14 NOTE — Discharge Instructions (Addendum)
PLEASE REMEMBER TO BRING ALL OF YOUR MEDICATIONS TO EACH OF YOUR FOLLOW-UP OFFICE VISITS.  PLEASE ATTEND ALL SCHEDULED FOLLOW-UP APPOINTMENTS.   Activity: Increase activity slowly as tolerated. You may shower, but no soaking baths (or swimming) for 1 week. No driving for 2 days. No lifting over 5 lbs for 1 week. No sexual activity for 1 week.   You May Return to Work: in 1 week (if applicable)  Wound Care: You may wash cath site gently with soap and water. Keep cath site clean and dry. If you notice pain, swelling, bleeding or pus at your cath site, please call [phone].    Cardiac Cath Site Care Refer to this sheet in the next few weeks. These instructions provide you with information on caring for yourself after your procedure. Your caregiver may also give you more specific instructions. Your treatment has been planned according to current medical practices, but problems sometimes occur. Call your caregiver if you have any problems or questions after your procedure. HOME CARE INSTRUCTIONS  You may shower 24 hours after the procedure. Remove the bandage (dressing) and gently wash the site with plain soap and water. Gently pat the site dry.   Do not apply powder or lotion to the site.   Do not sit in a bathtub, swimming pool, or whirlpool for 5 to 7 days.   No bending, squatting, or lifting anything over 10 pounds (4.5 kg) as directed by your caregiver.   Inspect the site at least twice daily.   Do not drive home if you are discharged the same day of the procedure. Have someone else drive you.   You may drive 24 hours after the procedure unless otherwise instructed by your caregiver.  What to expect:  Any bruising will usually fade within 1 to 2 weeks.   Blood that collects in the tissue (hematoma) may be painful to the touch. It should usually decrease in size and tenderness within 1 to 2 weeks.  SEEK IMMEDIATE MEDICAL CARE IF:  You have unusual pain at the site or down the  affected limb.   You have redness, warmth, swelling, or pain at the site.   You have drainage (other than a small amount of blood on the dressing).   You have chills.   You have a fever or persistent symptoms for more than 72 hours.   You have a fever and your symptoms suddenly get worse.   Your leg becomes pale, cool, tingly, or numb.   You have heavy bleeding from the site. Hold pressure on the site.  Document Released: 06/26/2010 Document Revised: 05/13/2011 Document Reviewed: 06/26/2010 [facility] Patient Information 2012 [place].   Acute Coronary Syndrome Acute coronary syndrome (ACS) is an urgent problem in which the blood and oxygen supply to the heart is critically deficient. ACS requires hospitalization because one or more coronary arteries may be blocked. ACS represents a range of conditions including:  Previous angina that is now unstable, lasts longer, happens at rest, or is more intense.  A heart attack, with heart muscle cell injury and death. There are three vital coronary arteries that supply the heart muscle with blood and oxygen so that it can pump blood effectively. If blockages to these arteries develop, blood flow to the heart muscle is reduced. If the heart does not get enough blood, angina may occur as the first warning sign. SYMPTOMS   The most common signs of angina include:  Tightness or squeezing in the chest.  Feeling of  heaviness on the chest.  Discomfort in the arms, neck, back, or jaw.  Shortness of breath and nausea.  Cold, wet skin.  Angina is usually brought on by physical effort or excitement which increase the oxygen needs of the heart. These states increase the blood flow needs of the heart beyond what can be delivered.  Other symptoms that are not as common include:  Fatigue  Unexplained feelings of nervousness or anxiety  Weakness  Diarrhea  Sometimes, you may not have noticed any symptoms at all but still suffered a  cardiac injury. TREATMENT   Medicines to help discomfort may include nitroglycerin (nitro) in the form of tablets or a spray for rapid relief, or longer-acting forms such as cream, patches, or capsules. (Be aware that there are many side effects and possible interactions with other drugs).  Other medicines may be used to help the heart pump better.  Procedures to open blocked arteries including angioplasty or stent placement to keep the arteries open.  Open heart surgery may be needed when there are many blockages or they are in critical locations that are best treated with surgery. HOME CARE INSTRUCTIONS   Do not use any tobacco products including cigarettes, chewing tobacco, or electronic cigarettes.  Take one baby or adult aspirin daily, if your health care provider advises. This helps reduce the risk of a heart attack.  It is very important that you follow the angina treatment prescribed by your health care provider. Make arrangements for proper follow-up care.  Eat a heart healthy diet with salt and fat restrictions as advised.  Regular exercise is good for you as long as it does not cause discomfort. Do not begin any new type of exercise until you check with your health care provider.  If you are overweight, you should lose weight.  Try to maintain normal blood lipid levels.  Keep your blood pressure under control as recommended by your health care provider.  You should tell your health care provider right away about any increase in the severity or frequency of your chest discomfort or angina attacks. When you have angina, you should stop what you are doing and sit down. This may bring relief in 3 to 5 minutes. If your health care provider has prescribed nitro, take it as directed.  If your health care provider has given you a follow-up appointment, it is very important to keep that appointment. Not keeping the appointment could result in a chronic or permanent injury, pain, and  disability. If there is any problem keeping the appointment, you must call back to this facility for assistance. SEEK IMMEDIATE MEDICAL CARE IF:   You develop nausea, vomiting, or shortness of breath.  You feel faint, lightheaded, or pass out.  Your chest discomfort gets worse.  You are sweating or experience sudden profound fatigue.  You do not get relief of your chest pain after 3 doses of nitro.  Your discomfort lasts longer than 15 minutes. MAKE SURE YOU:   Understand these instructions.  Will watch your condition.  Will get help right away if you are not doing well or get worse.  Take all medicines as directed by your health care provider. Document Released: 05/24/2005 Document Revised: 05/29/2013 Document Reviewed: 12/26/2007 [facility] Patient Information 2015 [place], [place]. This information is not intended to replace advice given to you by your health care provider. Make sure you discuss any questions you have with your health care provider.  Cardiac Diet A cardiac diet can help stop heart  disease or a stroke from happening. It involves eating less unhealthy fats and eating more healthy fats.  FOODS TO AVOID OR LIMIT  Limit saturated fats. This type of fat is found in oils and dairy products, such as:  Coconut oil.  Palm oil.  Cocoa butter.  Butter.  Avoid trans-fat or hydrogenated oils. These are found in fried or pre-made baked goods, such as:  Margarine.  Pre-made cookies, cakes, and crackers.  Limit processed meats (hot dogs, deli meats, sausage) to 3 ounces a week.  Limit high-fat meats (marbled meats, fried chicken, or chicken with skin) to 3 ounces a week.  Limit salt (sodium) to 1500 milligrams a day.   Limit sweets and drinks with added sugar to no more than 5 servings a week. One serving is:  1 tablespoon of sugar.  1 tablespoon of jelly or jam.   cup sorbet.  1 cup lemonade.   cup regular soda. EAT MORE OF THE FOLLOWING  FOODS Fruit  Eat 4to 5 servings a day. One serving of fruit is:  1 medium whole fruit.   cup dried fruit.   cup of fresh, frozen, or canned fruit.   cup 100% fruit juice. Vegetables  Eat 4 to 5 servings a day. One serving is:  1 cup raw leafy vegetables.   cup raw or cooked, cut-up vegetables.   cup vegetable juice. Whole Grains  Eat 3 servings a day (1 ounce equals 1 serving). Legumes (such as beans, peas, and lentils)   Eat at least 4 servings a week ( cup equals 1 serving). Nuts and Seeds   Eat at least 4 servings a week ( cup equals 1 serving). Dietary Fiber  Eat 20 to 30 grams a day. Some foods high in dietary fiber include:  Dried beans.  Citrus fruits.  Apples, bananas.  Broccoli, Brussels sprouts, and eggplant.  Oats. Omega-3 Fats  Eat food with omega-3 fats. You can also take a dietary pill (supplement) that has 1 gram of DHA and EPA. Have 3.5 ounces of fatty fish a week, such as:  Salmon.  Mackerel.  [place] tuna.  Sardines.  Lake trout.  Herring. PREPARING YOUR FOOD  Broil, bake, steam, or roast foods. Do not fry food. Do not cook food in butter (fat).  Use non-stick cooking sprays.  Remove skin from poultry, such as chicken and [place].  Remove fat from meat.  Take the fat off the top of stews, soups, and gravy.  Use lemon or herbs to flavor food instead of using butter or margarine.  Use nonfat yogurt, salsa, or low-fat dressings for salads. Document Released: 11/23/2011 Document Reviewed: 11/23/2011 [facility] Patient Information 2015 [place]. This information is not intended to replace advice given to you by your health care provider. Make sure you discuss any questions you have with your health care provider.

## 2013-12-14 NOTE — Progress Notes (Signed)
Patient seen and examined and agree with note as outlined by Rosaria Ferries, PA-C.  No evidence of NSVT on heart monitor.  He has had some short bursts of nonsustained atrial tachycardia.  His DBP is mildly elevated so will start on Toprol XL 12.5mg  daily.  Creatinine trending downward.  Stable from cardiac standpoint for discharge.

## 2013-12-14 NOTE — Progress Notes (Addendum)
Subjective: Patient is sitting up in his chair and is feeling much better. Denies CP/SOB overnight. He is eager to leave.  Objective: Vital signs in last 24 hours: Filed Vitals:   12/13/13 2111 12/14/13 0014 12/14/13 0403 12/14/13 0740  BP:  137/76 127/77 142/90  Pulse:  71 81 84  Temp:  98.4 F (36.9 C) 98.2 F (36.8 C) 98.1 F (36.7 C)  TempSrc:  Oral Oral Oral  Resp:  18 18 18   Height:      Weight:  189 lb 6 oz (85.9 kg)    SpO2: 95% 96% 97% 97%   Physical Exam: General: well developed, well nourished, comfortable HEENT: Mallampati score: 1, PERRL  Cardiac: distant heart sounds, RRR, normal S1S2, no M/R/G, no pain on chest palpation  Lungs: CTAB, no W/R/R  Abdomen: +BS, soft, nondistended, nontender  Extremities: bleeding around left arm IV site but no tenderness, erythema or swelling. Bruising, especially on arms, no obvious LE edema  Neurological: CN II-XII intact, subtle tremor on arm extension  Psychiatric: much less anxious today  Weight change: 0 lb (0 kg)  Intake/Output Summary (Last 24 hours) at 12/14/13 1144 Last data filed at 12/14/13 0915  Gross per 24 hour  Intake 1436.25 ml  Output   2600 ml  Net -1163.75 ml   Labs: Troponins were negative x3 following CP on day of admission, then negative x1 after CP yesterday, but 2.11/9.17/9.12 after cath yesterday into today (last 3:30AM)  Tele: nonsustained VT briefly post-cath, but none overnight. Had a 4-5 beat paroxysmal atrial tachycardia as well  Na 139 K 3.6 Cl 100 CO2 24 BUN 16 Cr 1.78<--1.95<--cath--<1.76<--1.66 Ca 8.9 Glucose 85  WBC 7.5 H&H 12.7/37.0 Plts 232  Mg 2.1  Lipids (yesterday) Chol 201 Trig 131 HDL 52 LDL 123  No new troponins  Cath Lab: [facility] IV Angina required left heart catheterization with PTCA and stenting of the mid RCA, 100% stenosis  Echo 7/8 post-catheterization: Impressions: - LVEF 55-60%, inferior hypokinesis, hyperdynamic anterior and lateral walls, mild  LVH, normal LA size, dilated IVC suggestive of elevated RA pressure.  Medications: I have reviewed the patient's current medications. Scheduled Meds: . ALPRAZolam  1 mg Oral TID  . aspirin EC  81 mg Oral Daily  . atorvastatin  80 mg Oral q1800  . budesonide-formoterol  1 puff Inhalation BID  . metoprolol succinate  12.5 mg Oral Daily  . pantoprazole  40 mg Oral Daily  . roflumilast  500 mcg Oral Daily  . sodium chloride  3 mL Intravenous Q12H  . sodium chloride  3 mL Intravenous Q12H  . ticagrelor  90 mg Oral BID  . tiotropium  18 mcg Inhalation Daily     PRN Meds:.sodium chloride, sodium chloride, acetaminophen, albuterol, ALPRAZolam, ondansetron (ZOFRAN) IV, sodium chloride, sodium chloride Assessment/Plan: Principal Problem:   NSTEMI (non-ST elevated myocardial infarction) Active Problems:   HTN (hypertension)   Acute renal failure superimposed on stage 3 chronic kidney disease   Hyperlipidemia   COPD (chronic obstructive pulmonary disease)   Coronary atherosclerosis of native coronary artery   Atrial tachycardia, paroxysmal  This 63 yo man with HTN, HLD, sleep apnea presented with symptoms concerning for ACS; however, he had negative troponins and EKGs. His TIMI II score was 2 (8% risk in 14 days all cause mortality related to MI). His heart score was 4 (12-16% chance of MI in 6 weeks).  During his first night of hospitalization, suffered an NSTEMI (EKG showed lateral ST depressions suggestive  of left sided blockage). He was treated with nitro. Low BPs were noted after this treatment. He went for cath.  Though we expected to find left sided blockage, instead he was found to have severe one-vessel coronary artery disease in the mid RCA (100% occluded); likely chronic and the cause of the patient's angina. A Xience drug-eluting stent was placed. The RCA location of the blockage may explain the patient's drop in BP after his nitro dosing (preload dependence). The AV groove from left  circumflex was also found to be subtotally occluded, and may have been what we appreciated on EKG. It was too small to stent.  A mildly elevated LVEDP was also noted on post-cath echo.  The patient has had no pain or tele events since the cath and his BPs have been more steady (fewer drops in BP). However, he stayed overnight yesterday due to uptrending creatinines which have now returned to pre-cath levels.   CAD: troponins trended up (2.11, 9.12, 9.17 after cath), but patient has been asymptomatic. Had non-sustained VT post-cath and 4-5 beat run of paroxysmal atrial tachy  - consider trying BB  - dual antiplatelet therapy for at least one year - hydrating with NS - Echocardiogram was performed to evaluate for LV systolic function showed EF 55-60% - continue tele - magnesium returned at 2.1 (normal)  Hypertension: patient states that BPs have been fluctuating at home; this may be corrected with the stent - trend BPs while in hospital; have been (127/77 - 163/84 over last 24 hours- much narrower range than prior to stent) - d/c lisinopril 10 mg BID home dose  - diet counseling - add toprol XL 12.5 daily given his elevated DBP  Hyperlipidemia:  - simvastatin 5 mg home dose  - diet counseling - needs f/u lipids/LFTs in 6 weeks  COPD:  - Continue home treatment (part of trial) on Daliresp 500 mcg  - Continue home treatment regimen with symbicort inhaler (1 puff BID), spiriva 18 mcg  - albuterol 2.5 mg PRN q6 hours   Anxiety:  - alprazolam home dosing of 24 hour 3mg  daily and 1 mg PRN   Headaches: patient takes quite a lot of NSAIDs for headaches  - hold patient's BC Headache Powder  - hold patient's ibuprofen   BPH: it does not appear that this patient is taking anything for his reported urinary retention  - Bladder scan and in-and-out cath if retaining >200   CKD (stage III): creatinine back down to pre-cath level 1.78 (had been uptrending since cath) - need more information  about this diagnosis, baseline unknown  GERD/gastritis: has been taking his wife's omeprazole - start Protonix for trial of reflux management  - zofran PRN   ?Sleep Apnea: patient's wife reports snoring  - continuous pulse-ox tonight to monitor for apnea; will recommend sleep study at d/c   Diet: heart healthy diet   DVT ppx:  - heparin injection 5,000 units   Dispo: Disposition is deferred at this time, awaiting improvement of current medical problems.  Anticipated discharge in approximately 0 day(s).   The patient does have a current PCP (Leonides Sake, MD) and does need an [facility] hospital follow-up appointment after discharge.  The patient does not have transportation limitations that hinder transportation to clinic appointments.  .Services Needed at time of discharge: Y = Yes, Blank = No PT:   OT:   RN:   Equipment:   Other:     LOS: 3 days   Drucilla Schmidt, MD  12/14/2013, 11:44 AM

## 2013-12-14 NOTE — Progress Notes (Signed)
  I have seen and examined the patient myself, and I have reviewed the note by Hulan Saas, MS 3 and was present during the interview and physical exam.  Please see my separate H&P for additional findings, assessment, and plan.   Signed: Drucilla Schmidt, MD 12/14/2013, 7:02 PM

## 2013-12-15 NOTE — Discharge Summary (Signed)
I agree with Dr. Sandi Raveling Discharge Summary with the following modifications:  1) What was initially felt to be non-sustained V-tach likely was an atrial tachycardia with aberrant conduction instead.  2) Follow-up LFTs are not required per guidelines.

## 2013-12-20 ENCOUNTER — Encounter: Payer: Self-pay | Admitting: Cardiovascular Disease

## 2013-12-20 ENCOUNTER — Ambulatory Visit (INDEPENDENT_AMBULATORY_CARE_PROVIDER_SITE_OTHER): Payer: Commercial Managed Care - HMO | Admitting: Cardiovascular Disease

## 2013-12-20 VITALS — BP 132/88 | HR 85 | Ht 69.0 in | Wt 180.8 lb

## 2013-12-20 DIAGNOSIS — N179 Acute kidney failure, unspecified: Secondary | ICD-10-CM

## 2013-12-20 DIAGNOSIS — E785 Hyperlipidemia, unspecified: Secondary | ICD-10-CM | POA: Diagnosis not present

## 2013-12-20 DIAGNOSIS — N183 Chronic kidney disease, stage 3 unspecified: Secondary | ICD-10-CM

## 2013-12-20 DIAGNOSIS — R0789 Other chest pain: Secondary | ICD-10-CM

## 2013-12-20 DIAGNOSIS — I214 Non-ST elevation (NSTEMI) myocardial infarction: Secondary | ICD-10-CM

## 2013-12-20 DIAGNOSIS — J438 Other emphysema: Secondary | ICD-10-CM

## 2013-12-20 DIAGNOSIS — I10 Essential (primary) hypertension: Secondary | ICD-10-CM | POA: Diagnosis not present

## 2013-12-20 DIAGNOSIS — I2510 Atherosclerotic heart disease of native coronary artery without angina pectoris: Secondary | ICD-10-CM

## 2013-12-20 MED ORDER — TADALAFIL 5 MG PO TABS: 5.0000 mg | ORAL_TABLET | Freq: Every day | ORAL | Status: DC | PRN

## 2013-12-20 MED ORDER — PRAVASTATIN SODIUM 80 MG PO TABS: 80.0000 mg | ORAL_TABLET | Freq: Every day | ORAL | Status: DC

## 2013-12-20 NOTE — Assessment & Plan Note (Signed)
Non-ST elevation MI on 12/11/2013. Occluded mid RCA. Stents placed. Also with 50% mid LAD disease, severe disease of a small diagonal vessel

## 2013-12-20 NOTE — Assessment & Plan Note (Addendum)
Recommended he talk with primary care for a referral to renal clinic. Etiology is not clear. Denies any history of diabetes. He reports having prior renal ultrasound. Creatinine climbed up to 1.9 with contrast, then down to 1.78 prior to discharge. ACE inhibitor held at discharge.

## 2013-12-20 NOTE — Assessment & Plan Note (Signed)
Blood pressure is well controlled on today's visit. No changes made to the medications. 

## 2013-12-20 NOTE — Assessment & Plan Note (Signed)
Recent stent placement to his mid RCA. Significant improvement in his shortness of breath. No further workup at this time. Continue asa and brilinta

## 2013-12-20 NOTE — Progress Notes (Signed)
Patient ID: William Mueller, male    DOB: January 22, 1951, 63 y.o.   MRN: [id number]  HPI Comments: Mr. Dantonio is a very pleasant 63 year old gentleman with a long history of smoking, COPD, chronic renal insufficiency, erectile dysfunction, who presented 12/11/2013 to [facility] with chest pain. He was taken to the cardiac catheterization lab where he was found to have an occluded mid RCA. Stent was placed followup in clinic today.  He reports that he had 2 weeks of stuttering chest pain prior to presenting to the hospital 12/11/2013. He was covered, did not tell his wife. He describes the symptoms as a severe chest heaviness, sometimes at rest, sometimes with exertion, lasting sometimes up to one hour at a time.  Since his discharge his breathing has been much better. Rare short lived chest pain so nothing like he had prior to the stent. He is walking with his dog, walking for exercise. Overall he feels much better. He is surprised how improve his breathing is. Wife has also noticed this.  He did bring up.dysfunction and is interested in trying various medications for this. He has not had significant workup in the past  EKG today shows normal sinus rhythm with rate 85 beats per minute, no significant ST or T wave changes  Creatinine in 10/22/2013 was 2.1  Cardiac catheterization 12/12/2013 Left Main:  Normal Left Anterior Descending (LAD):  Normal in size and mildly calcified. There is diffuse 20% disease throughout the midsegment. The rest of the vessel has minor irregularities. 1st diagonal (D1):  Normal in size with 50% ostial stenosis and 50% proximal stenosis. 2nd diagonal (D2):  Large in size with 30% proximal stenosis. 3rd diagonal (D3):  Very small in size. Circumflex (LCx):  Normal in size and nondominant. The vessel is moderately calcified with diffuse 20% disease in the midsegment. 1st obtuse marginal:  Very small in size. 2nd obtuse marginal:  Normal in size with no significant  disease. 3rd obtuse marginal:  Medium in size with no significant disease.           AV groove continuation segment: Small in size and subtotally occluded proximally.   Outpatient Encounter Prescriptions as of 12/20/2013  Medication Sig  . albuterol (PROVENTIL HFA;VENTOLIN HFA) 108 (90 BASE) MCG/ACT inhaler Inhale 1-2 puffs into the lungs every 6 (six) hours as needed for wheezing or shortness of breath.  . ALPRAZolam (XANAX XR) 3 MG 24 hr tablet Take 3 mg by mouth daily. For anxiety control  . ALPRAZolam (XANAX) 1 MG tablet Take 1 mg by mouth daily as needed. For breakthrough anxiety  . aspirin EC 81 MG EC tablet Take 1 tablet (81 mg total) by mouth daily.  Marland Kitchen DALIRESP 500 MCG TABS tablet Take 500 mcg by mouth daily.  . fenofibrate (TRICOR) 145 MG tablet Take 145 mg by mouth daily.  . metoprolol succinate (TOPROL-XL) 12.5 mg TB24 24 hr tablet Take 0.5 tablets (12.5 mg total) by mouth daily.  Marland Kitchen NITROSTAT 0.4 MG SL tablet Place 0.4 mg under the tongue every 5 (five) minutes as needed. If 3rd tablet needed call 911  . pravastatin (PRAVACHOL) 80 MG tablet Take 1 tablet (80 mg total) by mouth at bedtime.  Marland Kitchen SPIRIVA HANDIHALER 18 MCG inhalation capsule Place 18 mcg into inhaler and inhale daily.  . SYMBICORT 160-4.5 MCG/ACT inhaler Inhale 1 puff into the lungs 2 (two) times daily.  . ticagrelor (BRILINTA) 90 MG TABS tablet Take 1 tablet (90 mg total) by mouth 2 (two) times  daily.  .  pravastatin (PRAVACHOL) 10 MG tablet Take 10 mg by mouth at bedtime.    Review of Systems  Constitutional: Negative.   HENT: Negative.   Eyes: Negative.   Respiratory: Negative.   Cardiovascular: Negative.   Gastrointestinal: Negative.   Endocrine: Negative.   Musculoskeletal: Negative.   Skin: Negative.   Allergic/Immunologic: Negative.   Neurological: Negative.   Hematological: Negative.   Psychiatric/Behavioral: Negative.   All other systems reviewed and are negative.   BP 132/88  Pulse 85  Ht 5\' 9"   (1.753 m)  Wt 180 lb 12 oz (81.988 kg)  BMI 26.68 kg/m2  Physical Exam  Nursing note and vitals reviewed. Constitutional: He is oriented to person, place, and time. He appears well-developed and well-nourished.  HENT:  Head: Normocephalic.  Nose: Nose normal.  Mouth/Throat: Oropharynx is clear and moist.  Eyes: Conjunctivae are normal. Pupils are equal, round, and reactive to light.  Neck: Normal range of motion. Neck supple. No JVD present.  Cardiovascular: Normal rate, regular rhythm, S1 normal, S2 normal, normal heart sounds and intact distal pulses.  Exam reveals no gallop and no friction rub.   No murmur heard. Pulmonary/Chest: Effort normal. No respiratory distress. He has decreased breath sounds. He has no wheezes. He has no rales. He exhibits no tenderness.  Abdominal: Soft. Bowel sounds are normal. He exhibits no distension. There is no tenderness.  Musculoskeletal: Normal range of motion. He exhibits no edema and no tenderness.  Lymphadenopathy:    He has no cervical adenopathy.  Neurological: He is alert and oriented to person, place, and time. Coordination normal.  Skin: Skin is warm and dry. No rash noted. No erythema.  Psychiatric: He has a normal mood and affect. His behavior is normal. Judgment and thought content normal.      Assessment and Plan

## 2013-12-20 NOTE — Patient Instructions (Addendum)
You are doing well.  Please increase the pravastatin up to 40 mg daily, If tolerated for one month  Then increase up to 80 mg daily  Take cialis every day  Please call us if you have new issues that need to be addressed before your next appt.  Your physician wants you to follow-up in: 3 months.  You will receive a reminder letter in the mail two months in advance. If you don't receive a letter, please call our office to schedule the follow-up appointment.

## 2013-12-20 NOTE — Addendum Note (Signed)
Addended by: Minna Merritts on: 12/20/2013 06:37 PM   Modules accepted: Level of Service

## 2013-12-20 NOTE — Assessment & Plan Note (Signed)
We have recommended he increase the pravastatin up to 40 mg daily. If tolerated for one month, with increased subcutaneous 80 mg daily. Goal LDL less than 70.

## 2013-12-20 NOTE — Assessment & Plan Note (Addendum)
He stopped smoking several years ago. Continues to use a vapor cigarette

## 2013-12-28 ENCOUNTER — Encounter: Payer: Self-pay | Admitting: Cardiovascular Disease

## 2013-12-28 ENCOUNTER — Other Ambulatory Visit: Payer: Self-pay | Admitting: *Deleted

## 2013-12-28 ENCOUNTER — Other Ambulatory Visit: Payer: Self-pay | Admitting: Internal Medicine

## 2013-12-28 NOTE — Telephone Encounter (Signed)
Wants samples brilinta

## 2013-12-31 ENCOUNTER — Ambulatory Visit: Payer: Medicare HMO | Admitting: Cardiology

## 2014-02-11 ENCOUNTER — Other Ambulatory Visit: Payer: Self-pay | Admitting: Internal Medicine

## 2014-02-12 ENCOUNTER — Other Ambulatory Visit: Payer: Self-pay | Admitting: *Deleted

## 2014-02-12 MED ORDER — METOPROLOL SUCCINATE 12.5 MG HALF TABLET: 12.5000 mg | ORAL_TABLET | Freq: Every day | ORAL | Status: DC

## 2014-02-12 MED ORDER — METOPROLOL SUCCINATE ER 25 MG PO TB24: 12.5000 mg | ORAL_TABLET | Freq: Every day | ORAL | Status: DC

## 2014-02-12 NOTE — Telephone Encounter (Signed)
Requested Prescriptions   Signed Prescriptions Disp Refills  . metoprolol succinate (TOPROL-XL) 12.5 mg TB24 24 hr tablet 30 each 3    Sig: Take 0.5 tablets (12.5 mg total) by mouth daily.    Authorizing Provider: Minna Merritts    Ordering User: Britt Bottom

## 2014-02-12 NOTE — Telephone Encounter (Signed)
Requested Prescriptions   Signed Prescriptions Disp Refills  . metoprolol succinate (TOPROL-XL) 25 MG 24 hr tablet 30 tablet 3    Sig: Take 0.5 tablets (12.5 mg total) by mouth daily.    Authorizing Provider: Minna Merritts    Ordering User: Britt Bottom

## 2014-02-20 ENCOUNTER — Other Ambulatory Visit (HOSPITAL_COMMUNITY): Payer: Self-pay | Admitting: Internal Medicine

## 2014-02-23 ENCOUNTER — Other Ambulatory Visit (HOSPITAL_COMMUNITY): Payer: Self-pay | Admitting: Internal Medicine

## 2014-02-26 ENCOUNTER — Emergency Department (HOSPITAL_COMMUNITY)
Admission: EM | Admit: 2014-02-26 | Discharge: 2014-02-26 | Disposition: A | Discharge status: Home / Self Care | Payer: Medicare HMO | Attending: Emergency Medicine | Admitting: Emergency Medicine

## 2014-02-26 ENCOUNTER — Emergency Department (HOSPITAL_COMMUNITY): Payer: Medicare HMO

## 2014-02-26 ENCOUNTER — Telehealth: Payer: Self-pay

## 2014-02-26 ENCOUNTER — Encounter (HOSPITAL_COMMUNITY): Payer: Self-pay | Admitting: Emergency Medicine

## 2014-02-26 ENCOUNTER — Other Ambulatory Visit (HOSPITAL_COMMUNITY): Payer: Self-pay | Admitting: Internal Medicine

## 2014-02-26 DIAGNOSIS — R5383 Other fatigue: Secondary | ICD-10-CM

## 2014-02-26 DIAGNOSIS — N183 Chronic kidney disease, stage 3 unspecified: Secondary | ICD-10-CM | POA: Insufficient documentation

## 2014-02-26 DIAGNOSIS — I252 Old myocardial infarction: Secondary | ICD-10-CM | POA: Diagnosis not present

## 2014-02-26 DIAGNOSIS — Z87891 Personal history of nicotine dependence: Secondary | ICD-10-CM | POA: Insufficient documentation

## 2014-02-26 DIAGNOSIS — E7800 Pure hypercholesterolemia, unspecified: Secondary | ICD-10-CM | POA: Insufficient documentation

## 2014-02-26 DIAGNOSIS — Z7982 Long term (current) use of aspirin: Secondary | ICD-10-CM | POA: Diagnosis not present

## 2014-02-26 DIAGNOSIS — Z9861 Coronary angioplasty status: Secondary | ICD-10-CM | POA: Insufficient documentation

## 2014-02-26 DIAGNOSIS — R5381 Other malaise: Secondary | ICD-10-CM | POA: Diagnosis not present

## 2014-02-26 DIAGNOSIS — F411 Generalized anxiety disorder: Secondary | ICD-10-CM | POA: Insufficient documentation

## 2014-02-26 DIAGNOSIS — I129 Hypertensive chronic kidney disease with stage 1 through stage 4 chronic kidney disease, or unspecified chronic kidney disease: Secondary | ICD-10-CM | POA: Insufficient documentation

## 2014-02-26 DIAGNOSIS — R0602 Shortness of breath: Secondary | ICD-10-CM

## 2014-02-26 DIAGNOSIS — K219 Gastro-esophageal reflux disease without esophagitis: Secondary | ICD-10-CM | POA: Insufficient documentation

## 2014-02-26 DIAGNOSIS — Z79899 Other long term (current) drug therapy: Secondary | ICD-10-CM | POA: Diagnosis not present

## 2014-02-26 DIAGNOSIS — R079 Chest pain, unspecified: Secondary | ICD-10-CM | POA: Diagnosis not present

## 2014-02-26 DIAGNOSIS — J438 Other emphysema: Secondary | ICD-10-CM | POA: Diagnosis not present

## 2014-02-26 LAB — BASIC METABOLIC PANEL
ANION GAP: 13 (ref 5–15)
BUN: 14 mg/dL (ref 6–23)
CO2: 27 meq/L (ref 19–32)
Calcium: 9.5 mg/dL (ref 8.4–10.5)
Chloride: 99 mEq/L (ref 96–112)
Creatinine, Ser: 1.72 mg/dL — ABNORMAL HIGH (ref 0.50–1.35)
GFR calc non Af Amer: 40 mL/min — ABNORMAL LOW (ref >90)
GFR, EST AFRICAN AMERICAN: 47 mL/min — AB (ref >90)
Glucose, Bld: 73 mg/dL (ref 70–99)
POTASSIUM: 4.1 meq/L (ref 3.7–5.3)
Sodium: 139 mEq/L (ref 137–147)

## 2014-02-26 LAB — CBC
HEMATOCRIT: 41.3 % (ref 39.0–52.0)
Hemoglobin: 14.1 g/dL (ref 13.0–17.0)
MCH: 30.3 pg (ref 26.0–34.0)
MCHC: 34.1 g/dL (ref 30.0–36.0)
MCV: 88.6 fL (ref 78.0–100.0)
PLATELETS: 293 10*3/uL (ref 150–400)
RBC: 4.66 MIL/uL (ref 4.22–5.81)
RDW: 12.6 % (ref 11.5–15.5)
WBC: 6.8 10*3/uL (ref 4.0–10.5)

## 2014-02-26 LAB — I-STAT TROPONIN, ED: TROPONIN I, POC: 0 ng/mL (ref 0.00–0.08)

## 2014-02-26 LAB — PROTIME-INR
INR: 0.99 (ref 0.00–1.49)
Prothrombin Time: 13.1 seconds (ref 11.6–15.2)

## 2014-02-26 LAB — TROPONIN I

## 2014-02-26 LAB — PRO B NATRIURETIC PEPTIDE: Pro B Natriuretic peptide (BNP): 131.9 pg/mL — ABNORMAL HIGH (ref 0–125)

## 2014-02-26 MED ORDER — ASPIRIN 81 MG PO CHEW
243.0000 mg | CHEWABLE_TABLET | Freq: Once | ORAL | Status: AC
  Administered 2014-02-26: 243 mg via ORAL
  Filled 2014-02-26: qty 3

## 2014-02-26 NOTE — Telephone Encounter (Signed)
LMTCB. Please verify medications needing refilled.

## 2014-02-26 NOTE — ED Notes (Signed)
To ED by private vehicle from home, with c/o chest pain that started at 12:00 while sitting down-- tightness across chest with some numbness in left arm. Denies any pain at present.

## 2014-02-26 NOTE — ED Notes (Signed)
Cardiology at bedside.

## 2014-02-26 NOTE — ED Provider Notes (Signed)
See prior note   Janice Norrie, MD 02/26/14 1929

## 2014-02-26 NOTE — ED Provider Notes (Signed)
Patient presents with chest pain. Patient had a non-STEMI in July with one stent placed in his right coronary artery. Patient was noted to have diffuse 20-50% disease in his LAD. Patient reports for the past couple of weeks he's been having nonexertional chest tightness and points to his left upper chest. He states it lasts a few minutes at a time. He started getting shortness of breath yesterday. The last time he had an episode of pain was about noon today. Patient states he is taking his Brilinta.   Patient is awake and alert. He is in no distress at this time. He has no respiratory distress. He has no edema of his extremities.   Medical screening examination/treatment/procedure(s) were conducted as a shared visit with non-physician practitioner(s) and myself.  I personally evaluated the patient during the encounter.   EKG Interpretation   Date/Time:  Tuesday February 26 2014 14:52:30 EDT Ventricular Rate:  99 PR Interval:  130 QRS Duration: 82 QT Interval:  350 QTC Calculation: 449 R Axis:   77 Text Interpretation:  Normal sinus rhythm Normal ECG No significant change  since last tracing 13 Dec 2013 Confirmed by [place]  MD-I, Damiyah Ditmars ([id number]) on  02/26/2014 4:23:20 PM      Rolland Porter, MD, Alanson Aly, MD 02/26/14 [phone]

## 2014-02-26 NOTE — ED Provider Notes (Signed)
CSN: [id number]     Arrival date & time 02/26/14  1444 History   First MD Initiated Contact with Patient 02/26/14 1608     Chief Complaint  Patient presents with  . Chest Pain     (Consider location/radiation/quality/duration/timing/severity/associated sxs/prior Treatment) Patient is a 63 y.o. male presenting with chest pain. The history is provided by the patient and medical records. No language interpreter was used.  Chest Pain Associated symptoms: cough, fatigue, nausea, shortness of breath and weakness   Associated symptoms: no abdominal pain, no back pain, no diaphoresis, no fever, no headache and not vomiting     William Mueller is a 63 y.o. male  with a hx of smoking, COPD, chronic renal insufficiency, erectile dysfunction presents to the Emergency Department complaining of intermittent chest tightness returning again today at 12:00 while sitting down, but had resolved upon arrival in the ED.  Today's episode was associated with nausea, lightheadedness and SOB, but no diaphoresis. He reports the previous episodes last 5-42min and then resolve spontaneously.   Pt has had associated SOB over the last sevreal weeks only when he is feeling the chest pain. Pt reports feeling like a new person after the stint, but in the last few weeks he has felt the same as before the stint.  He also reports feeling like his balance is off and he feels generally week.  He has a follow-up with cardiology on Oct 16.  Pt reports that exertion does not always bring on the CP.  Pt denies fever, chills, headache, neck pain, abd pain, N/V/D, dizziness, syncope, dysuria.  Wife reports more coughing in the last several days.    Pt with cardiac cath on 12/12/13 after NSTEMI on 12/11/13 where he was found to have an occluded mid RCA and stent was placed.  Pt is now taking ASA and Brilinta.  Episodes of CP prior to the sent were both exertional and at rest.      Past Medical History  Diagnosis Date  . Hypertension   .  Anxiety   . COPD (chronic obstructive pulmonary disease)   . High cholesterol   . GERD (gastroesophageal reflux disease)   . Daily headache   . Chronic kidney disease (CKD), stage III (moderate)   . MI (myocardial infarction)    Past Surgical History  Procedure Laterality Date  . No past surgeries    . Cardiac catheterization      MC x 1 stent  . Coronary angioplasty     Family History  Problem Relation Age of Onset  . Coronary artery disease Brother    History  Substance Use Topics  . Smoking status: Former Smoker -- 3.00 packs/day for 48 years    Types: Cigarettes    Quit date: 06/07/2010  . Smokeless tobacco: Never Used  . Alcohol Use: 3.6 oz/week    6 Cans of beer per week    Review of Systems  Constitutional: Positive for fatigue. Negative for fever, diaphoresis, appetite change and unexpected weight change.  HENT: Negative for mouth sores.   Eyes: Negative for visual disturbance.  Respiratory: Positive for cough, chest tightness and shortness of breath. Negative for wheezing.   Cardiovascular: Positive for chest pain.  Gastrointestinal: Positive for nausea. Negative for vomiting, abdominal pain, diarrhea and constipation.  Endocrine: Negative for polydipsia, polyphagia and polyuria.  Genitourinary: Negative for dysuria, urgency, frequency and hematuria.  Musculoskeletal: Negative for back pain and neck stiffness.  Skin: Negative for rash.  Allergic/Immunologic: Negative for immunocompromised  state.  Neurological: Positive for weakness. Negative for syncope, light-headedness and headaches.  Hematological: Does not bruise/bleed easily.  Psychiatric/Behavioral: Negative for sleep disturbance. The patient is not nervous/anxious.       Allergies  Benzodiazepines; Serotonin reuptake inhibitors (ssris); Diazepam; Doxycycline monohydrate; Escitalopram oxalate; Lorazepam; and Tetracyclines & related  Home Medications   Prior to Admission medications   Medication Sig  Start Date End Date Taking? Authorizing Provider  acetaminophen (TYLENOL) 500 MG tablet Take 1,000 mg by mouth 2 (two) times daily as needed for headache.   Yes Historical Provider, MD  ALPRAZolam (XANAX XR) 3 MG 24 hr tablet Take 3 mg by mouth daily. For anxiety control   Yes Historical Provider, MD  ALPRAZolam Duanne Moron) 1 MG tablet Take 1 mg by mouth daily as needed. For breakthrough anxiety 11/21/13  Yes Historical Provider, MD  aspirin EC 81 MG EC tablet Take 1 tablet (81 mg total) by mouth daily. 12/14/13  Yes Drucilla Schmidt, MD  DALIRESP 500 MCG TABS tablet Take 500 mcg by mouth daily. 11/09/13  Yes Historical Provider, MD  fenofibrate (TRICOR) 145 MG tablet Take 145 mg by mouth daily.   Yes Historical Provider, MD  metoprolol succinate (TOPROL-XL) 25 MG 24 hr tablet Take 0.5 tablets (12.5 mg total) by mouth daily. 02/12/14  Yes Minna Merritts, MD  omeprazole (PRILOSEC) 40 MG capsule Take 40 mg by mouth daily.   Yes Historical Provider, MD  pravastatin (PRAVACHOL) 80 MG tablet Take 1 tablet (80 mg total) by mouth at bedtime. 12/20/13  Yes Minna Merritts, MD  SPIRIVA HANDIHALER 18 MCG inhalation capsule Place 18 mcg into inhaler and inhale daily. 11/27/13  Yes Historical Provider, MD  SYMBICORT 160-4.5 MCG/ACT inhaler Inhale 1 puff into the lungs 2 (two) times daily. 11/09/13  Yes Historical Provider, MD  ticagrelor (BRILINTA) 90 MG TABS tablet Take 1 tablet (90 mg total) by mouth 2 (two) times daily. 12/14/13  Yes Drucilla Schmidt, MD  albuterol (PROVENTIL HFA;VENTOLIN HFA) 108 (90 BASE) MCG/ACT inhaler Inhale 1-2 puffs into the lungs every 6 (six) hours as needed for wheezing or shortness of breath.    Historical Provider, MD  NITROSTAT 0.4 MG SL tablet Place 0.4 mg under the tongue every 5 (five) minutes as needed. If 3rd tablet needed call 911 09/18/13   Historical Provider, MD   BP 117/75  Pulse 73  Temp(Src) 98.5 F (36.9 C) (Oral)  Resp 16  Wt 184 lb (83.462 kg)  SpO2 100% Physical Exam   Nursing note and vitals reviewed. Constitutional: He appears well-developed and well-nourished. No distress.  Awake, alert, nontoxic appearance  HENT:  Head: Normocephalic and atraumatic.  Mouth/Throat: Oropharynx is clear and moist. No oropharyngeal exudate.  Eyes: Conjunctivae are normal. No scleral icterus.  Neck: Normal range of motion. Neck supple.  Cardiovascular: Normal rate, regular rhythm, normal heart sounds and intact distal pulses.   No murmur heard. Pulmonary/Chest: Effort normal and breath sounds normal. No respiratory distress. He has no wheezes.  Equal chest expansion Clear and equal breath sounds  Abdominal: Soft. Bowel sounds are normal. He exhibits no mass. There is no tenderness. There is no rebound and no guarding.  Musculoskeletal: Normal range of motion. He exhibits no edema.  Neurological: He is alert. Coordination normal.  Speech is clear and goal oriented Moves extremities without ataxia  Skin: Skin is warm and dry. He is not diaphoretic. No erythema.  Psychiatric: He has a normal mood and affect.    ED Course  Procedures (including critical care time) Labs Review Labs Reviewed  BASIC METABOLIC PANEL - Abnormal; Notable for the following:    Creatinine, Ser 1.72 (*)    GFR calc non Af Amer 40 (*)    GFR calc Af Amer 47 (*)    All other components within normal limits  PRO B NATRIURETIC PEPTIDE - Abnormal; Notable for the following:    Pro B Natriuretic peptide (BNP) 131.9 (*)    All other components within normal limits  CBC  TROPONIN I  PROTIME-INR  I-STAT TROPOININ, ED    Imaging Review Dg Chest 2 View  02/26/2014   CLINICAL DATA:  Chest pain for 3-4 days worse today, shortness of breath began yesterday, history MI, stent placement in July, hypertension, COPD  EXAM: CHEST  2 VIEW  COMPARISON:  12/11/2013  FINDINGS: Normal heart size, mediastinal contours, and pulmonary vascularity.  Emphysematous changes consistent with COPD.  No acute  infiltrate, pleural effusion or pneumothorax.  Bones demineralized.  IMPRESSION: Emphysematous changes consistent with COPD.  No acute infiltrate.   Electronically Signed   By: Lavonia Dana M.D.   On: 02/26/2014 17:56     EKG Interpretation   Date/Time:  Tuesday February 26 2014 14:52:30 EDT Ventricular Rate:  99 PR Interval:  130 QRS Duration: 82 QT Interval:  350 QTC Calculation: 449 R Axis:   77 Text Interpretation:  Normal sinus rhythm Normal ECG No significant change  since last tracing 13 Dec 2013 Confirmed by [facility]  MD-I, IVA ([id number]) on  02/26/2014 4:23:20 PM      Echo 12/12/2013: Impressions: - LVEF 55-60%, inferior hypokinesis, hyperdynamic anterior and lateral walls, mild LVH, normal LA size, dilated IVC suggestive of elevated RA pressure.  Cardiac catheterization 12/12/2013  Left Main: Normal  Left Anterior Descending (LAD): Normal in size and mildly calcified. There is diffuse 20% disease throughout the midsegment. The rest of the vessel has minor irregularities.  1st diagonal (D1): Normal in size with 50% ostial stenosis and 50% proximal stenosis.  2nd diagonal (D2): Large in size with 30% proximal stenosis.  3rd diagonal (D3): Very small in size.  Circumflex (LCx): Normal in size and nondominant. The vessel is moderately calcified with diffuse 20% disease in the midsegment.  1st obtuse marginal: Very small in size.  2nd obtuse marginal: Normal in size with no significant disease.  3rd obtuse marginal: Medium in size with no significant disease.  AV groove continuation segment: Small in size and subtotally occluded proximally   MDM   Final diagnoses:  Chest pain, unspecified chest pain type  SOB (shortness of breath)  Other emphysema   William Mueller presents with CP with Hx of CAD and other risk factors.  Recent stenting of the RCA and 50% stenosis of the LAD at that time.  Worsening SOB and CP today, now resolved; concerning for episode of unstable angina. Pt  without return of CP in the ED.  Pt given ASA and reports compliance with Brilinta. Initial troponin neg and ECG nonischemic.  Will consult cardiology.    6:45 PM Discussed with Dr. Mare Ferrari (cardiology) who will evaluate.  7:17 PM Dr. Mare Ferrari is comfortable with close outpatient follow-up and I agree.  Pt is to see his PCP in 3 days for further evaluation.  We discussed reasons to return to the emergency department.    I have personally reviewed patient's vitals, nursing note and any pertinent labs or imaging.  I performed an undressed physical exam.    It has  been determined that no acute conditions requiring further emergency intervention are present at this time. The patient/guardian have been advised of the diagnosis and plan. I reviewed all labs and imaging including any potential incidental findings.   Vital signs are stable at discharge.   BP 117/75  Pulse 73  Temp(Src) 98.5 F (36.9 C) (Oral)  Resp 16  Wt 184 lb (83.462 kg)  SpO2 100%  The patient was discussed with and seen by Dr. Rolland Porter who agrees with the treatment plan.    William Soho Krissie Merrick, PA-C 02/26/14 1920

## 2014-02-26 NOTE — Discharge Instructions (Signed)
1. Medications: usual home medications 2. Treatment: rest, drink plenty of fluids,  3. Follow Up: Please followup with your primary doctor  In 3 days for discussion of your diagnoses and further evaluation after today's visit; return to the emergency room for chest pain that is associated with SOB, nausea, diaphoresis

## 2014-02-26 NOTE — ED Notes (Signed)
Diet tray ordered 

## 2014-02-26 NOTE — Consult Note (Signed)
CARDIOLOGY CONSULT NOTE   Patient ID: William Mueller MRN: [id number], DOB/AGE: Nov 29, 1950   Admit date: 02/26/2014 Date of Consult: 02/26/2014   Primary Physician: Leonides Sake, MD Primary Cardiologist: Dr. Rockey Situ  Pt. Profile  63 year old gentleman with history of coronary artery disease presents to the emergency room with chest pressure now relieved. Problem List  Past Medical History  Diagnosis Date  . Hypertension   . Anxiety   . COPD (chronic obstructive pulmonary disease)   . High cholesterol   . GERD (gastroesophageal reflux disease)   . Daily headache   . Chronic kidney disease (CKD), stage III (moderate)   . MI (myocardial infarction)     Past Surgical History  Procedure Laterality Date  . No past surgeries    . Cardiac catheterization      MC x 1 stent  . Coronary angioplasty       Allergies  Allergies  Allergen Reactions  . Benzodiazepines Anaphylaxis and Shortness Of Breath  . Serotonin Reuptake Inhibitors (Ssris) Shortness Of Breath and Palpitations  . Diazepam     Other reaction(s): Unknown  . Doxycycline Monohydrate     Other reaction(s): Unknown  . Escitalopram Oxalate     Other reaction(s): Other (See Comments) serotonin Syndrome  . Lorazepam     Other reaction(s): Unknown  . Tetracyclines & Related Itching and Rash    HPI   This 63 year old gentleman came to the emergency room room after experiencing chest tightness about noontime today.  He did not take any sublingual nitroglycerin.  The discomfort occurred at rest.  By the time he came to the emergency room, the pain had resolved.  There was no radiation of the pain.  There was no diaphoresis.  At the present time the patient is feeling well. He has a history of having had a non-STEMI on 12/11/13 secondary to an occluded mid RCA.  He underwent placement of a drug-eluting stent to the right coronary artery 12/12/13 by Dr. Fletcher Anon.  He has been on dual antiplatelet therapy with aspirin and  Brilinta.  He denies any significant dyspnea to suggest allergy to Brilinta.  He does have chronic dyspnea secondary to his COPD.  He has renal insufficiency with creatinine today of 1.7  Inpatient Medications    Family History Family History  Problem Relation Age of Onset  . Coronary artery disease Brother      Social History History   Social History  . Marital Status: Married    Spouse Name: N/A    Number of Children: N/A  . Years of Education: N/A   Occupational History  . Not on file.   Social History Main Topics  . Smoking status: Former Smoker -- 3.00 packs/day for 48 years    Types: Cigarettes    Quit date: 06/07/2010  . Smokeless tobacco: Never Used  . Alcohol Use: 3.6 oz/week    6 Cans of beer per week  . Drug Use: No  . Sexual Activity: Not Currently   Other Topics Concern  . Not on file   Social History Narrative  . No narrative on file     Review of Systems  General:  No chills, fever, night sweats or weight changes.  Cardiovascular:  No chest pain, dyspnea on exertion, edema, orthopnea, palpitations, paroxysmal nocturnal dyspnea. Dermatological: No rash, lesions/masses Respiratory: No cough, dyspnea Urologic: No hematuria, dysuria Abdominal:   No nausea, vomiting, diarrhea, bright red blood per rectum, melena, or hematemesis Neurologic:  No visual changes, wkns,  changes in mental status. All other systems reviewed and are otherwise negative except as noted above.  Physical Exam  Blood pressure 117/75, pulse 73, temperature 98.5 F (36.9 C), temperature source Oral, resp. rate 16, weight 184 lb (83.462 kg), SpO2 100.00%.  General: Pleasant, NAD Psych: Normal affect. Neuro: Alert and oriented X 3. Moves all extremities spontaneously. HEENT: Normal  Neck: Supple without bruits or JVD. Lungs:  Chest and distant breath sounds consistent with COPD Heart: RRR no s3, s4, or murmurs. Abdomen: Soft, non-tender, non-distended, BS + x 4.  Extremities:  No clubbing, cyanosis or edema. DP/PT/Radials 2+ and equal bilaterally.  Labs   Recent Labs  02/26/14 1550  TROPONINI <0.30   Lab Results  Component Value Date   WBC 6.8 02/26/2014   HGB 14.1 02/26/2014   HCT 41.3 02/26/2014   MCV 88.6 02/26/2014   PLT 293 02/26/2014     Recent Labs Lab 02/26/14 1550  NA 139  K 4.1  CL 99  CO2 27  BUN 14  CREATININE 1.72*  CALCIUM 9.5  GLUCOSE 73   Lab Results  Component Value Date   CHOL 201* 12/12/2013   HDL 52 12/12/2013   LDLCALC 123* 12/12/2013   TRIG 131 12/12/2013   No results found for this basename: DDIMER    Radiology/Studies  Dg Chest 2 View  02/26/2014   CLINICAL DATA:  Chest pain for 3-4 days worse today, shortness of breath began yesterday, history MI, stent placement in July, hypertension, COPD  EXAM: CHEST  2 VIEW  COMPARISON:  12/11/2013  FINDINGS: Normal heart size, mediastinal contours, and pulmonary vascularity.  Emphysematous changes consistent with COPD.  No acute infiltrate, pleural effusion or pneumothorax.  Bones demineralized.  IMPRESSION: Emphysematous changes consistent with COPD.  No acute infiltrate.   Electronically Signed   By: Lavonia Dana M.D.   On: 02/26/2014 17:56    ECG  Normal sinus rhythm.  No ischemic changes.  Within normal limits.  ASSESSMENT AND PLAN    No evidence of recurrent ACS.  Troponins are normal.  EKG is normal.  No recurrence of pain since noon today prior to arrival  Plan: Okay for discharge tonight.  He should continue current medications.  He will try to drink more water to improve his renal function.  He will keep his appointment in October with Dr. Rockey Situ.   Signed, Darlin Coco, MD  02/26/2014, 7:15 PM

## 2014-02-27 ENCOUNTER — Other Ambulatory Visit: Payer: Self-pay | Admitting: *Deleted

## 2014-02-27 MED ORDER — TICAGRELOR 90 MG PO TABS: 90.0000 mg | ORAL_TABLET | Freq: Two times a day (BID) | ORAL | Status: DC

## 2014-02-27 NOTE — Telephone Encounter (Signed)
Requested Prescriptions   Signed Prescriptions Disp Refills  . ticagrelor (BRILINTA) 90 MG TABS tablet 60 tablet 3    Sig: Take 1 tablet (90 mg total) by mouth 2 (two) times daily.    Authorizing Provider: Minna Merritts    Ordering User: Britt Bottom

## 2014-03-22 ENCOUNTER — Encounter: Payer: Self-pay | Admitting: Cardiovascular Disease

## 2014-03-22 ENCOUNTER — Ambulatory Visit (INDEPENDENT_AMBULATORY_CARE_PROVIDER_SITE_OTHER): Payer: Commercial Managed Care - HMO | Admitting: Cardiovascular Disease

## 2014-03-22 VITALS — BP 130/90 | Ht 69.0 in | Wt 181.5 lb

## 2014-03-22 DIAGNOSIS — N183 Chronic kidney disease, stage 3 unspecified: Secondary | ICD-10-CM

## 2014-03-22 DIAGNOSIS — R079 Chest pain, unspecified: Secondary | ICD-10-CM

## 2014-03-22 DIAGNOSIS — E785 Hyperlipidemia, unspecified: Secondary | ICD-10-CM

## 2014-03-22 DIAGNOSIS — I2510 Atherosclerotic heart disease of native coronary artery without angina pectoris: Secondary | ICD-10-CM

## 2014-03-22 DIAGNOSIS — I471 Supraventricular tachycardia: Secondary | ICD-10-CM

## 2014-03-22 DIAGNOSIS — N179 Acute kidney failure, unspecified: Secondary | ICD-10-CM

## 2014-03-22 DIAGNOSIS — J438 Other emphysema: Secondary | ICD-10-CM

## 2014-03-22 DIAGNOSIS — R0789 Other chest pain: Secondary | ICD-10-CM | POA: Insufficient documentation

## 2014-03-22 DIAGNOSIS — I159 Secondary hypertension, unspecified: Secondary | ICD-10-CM

## 2014-03-22 NOTE — Assessment & Plan Note (Signed)
Baseline creatinine 1.7. First noted in July 2015. Stable. Etiology not clear.

## 2014-03-22 NOTE — Assessment & Plan Note (Signed)
He reports that he sees Dr. Raul Del. Is on several inhalers but did comment on the cost

## 2014-03-22 NOTE — Assessment & Plan Note (Signed)
Currently with no symptoms of angina. No further workup at this time. Continue current medication regimen. 

## 2014-03-22 NOTE — Assessment & Plan Note (Signed)
No symptoms concerning for arrhythmia on today's visit. Suggested he continue on his current medications

## 2014-03-22 NOTE — Assessment & Plan Note (Signed)
Blood pressure is well controlled on today's visit. No changes made to the medications. 

## 2014-03-22 NOTE — Progress Notes (Signed)
Patient ID: LIZZIE COKLEY, male    DOB: 12-13-1950, 63 y.o.   MRN: [id number]  HPI Comments: Mr. Mackins is a very pleasant 63 year old gentleman with a long history of smoking, COPD, chronic renal insufficiency, erectile dysfunction, who presented 12/11/2013 to [facility] with chest pain. He was taken to the cardiac catheterization lab where he was found to have an occluded mid RCA. Stent was placed  In followup today, he presented to the hospital 02/26/2014 with chest pain. He seen by cardiology, rule out for MI, had normal EKG and discharged home. Since then he has had one episode of very light chest pain most likely at rest. He has been active, able to walk up to 1 mile without any reproducible symptoms concerning for angina.  Echocardiogram July 2015 showed ejection fraction 55-60% Otherwise he feels well with no complaints He feels that his breathing has significantly improved since he stopped smoking  EKG today shows normal sinus rhythm with rate 85 beats per minute, no significant ST or T wave changes  Creatinine in 10/22/2013 was 2.1  Cardiac catheterization 12/12/2013 Left Main:  Normal Left Anterior Descending (LAD):  Normal in size and mildly calcified. There is diffuse 20% disease throughout the midsegment. The rest of the vessel has minor irregularities. 1st diagonal (D1):  Normal in size with 50% ostial stenosis and 50% proximal stenosis. 2nd diagonal (D2):  Large in size with 30% proximal stenosis. 3rd diagonal (D3):  Very small in size. Circumflex (LCx):  Normal in size and nondominant. The vessel is moderately calcified with diffuse 20% disease in the midsegment. 1st obtuse marginal:  Very small in size. 2nd obtuse marginal:  Normal in size with no significant disease. 3rd obtuse marginal:  Medium in size with no significant disease.           AV groove continuation segment: Small in size and subtotally occluded proximally.   Outpatient Encounter Prescriptions as of  03/22/2014  Medication Sig  . acetaminophen (TYLENOL) 500 MG tablet Take 1,000 mg by mouth 2 (two) times daily as needed for headache.  . albuterol (PROVENTIL HFA;VENTOLIN HFA) 108 (90 BASE) MCG/ACT inhaler Inhale 1-2 puffs into the lungs every 6 (six) hours as needed for wheezing or shortness of breath.  . ALPRAZolam (XANAX XR) 3 MG 24 hr tablet Take 3 mg by mouth daily. For anxiety control  . ALPRAZolam (XANAX) 1 MG tablet Take 1 mg by mouth daily as needed. For breakthrough anxiety  . aspirin EC 81 MG EC tablet Take 1 tablet (81 mg total) by mouth daily.  Marland Kitchen DALIRESP 500 MCG TABS tablet Take 500 mcg by mouth daily.  . fenofibrate (TRICOR) 145 MG tablet Take 145 mg by mouth daily.  . metoprolol succinate (TOPROL-XL) 25 MG 24 hr tablet Take 0.5 tablets (12.5 mg total) by mouth daily.  Marland Kitchen NITROSTAT 0.4 MG SL tablet Place 0.4 mg under the tongue every 5 (five) minutes as needed. If 3rd tablet needed call 911  . omeprazole (PRILOSEC) 40 MG capsule Take 40 mg by mouth daily.  . pravastatin (PRAVACHOL) 80 MG tablet Take 1 tablet (80 mg total) by mouth at bedtime.  Marland Kitchen SPIRIVA HANDIHALER 18 MCG inhalation capsule Place 18 mcg into inhaler and inhale daily.  . SYMBICORT 160-4.5 MCG/ACT inhaler Inhale 1 puff into the lungs 2 (two) times daily.  . ticagrelor (BRILINTA) 90 MG TABS tablet Take 1 tablet (90 mg total) by mouth 2 (two) times daily.    Review of Systems  Constitutional:  Negative.   HENT: Negative.   Eyes: Negative.   Respiratory: Negative.   Cardiovascular: Negative.   Gastrointestinal: Negative.   Endocrine: Negative.   Musculoskeletal: Negative.   Skin: Negative.   Allergic/Immunologic: Negative.   Neurological: Negative.   Hematological: Negative.   Psychiatric/Behavioral: Negative.   All other systems reviewed and are negative.   BP 130/90  Ht 5\' 9"  (1.753 m)  Wt 181 lb 8 oz (82.328 kg)  BMI 26.79 kg/m2  Physical Exam  Nursing note and vitals reviewed. Constitutional: He  is oriented to person, place, and time. He appears well-developed and well-nourished.  HENT:  Head: Normocephalic.  Nose: Nose normal.  Mouth/Throat: Oropharynx is clear and moist.  Eyes: Conjunctivae are normal. Pupils are equal, round, and reactive to light.  Neck: Normal range of motion. Neck supple. No JVD present.  Cardiovascular: Normal rate, regular rhythm, S1 normal, S2 normal, normal heart sounds and intact distal pulses.  Exam reveals no gallop and no friction rub.   No murmur heard. Pulmonary/Chest: Effort normal. No respiratory distress. He has decreased breath sounds. He has no wheezes. He has no rales. He exhibits no tenderness.  Abdominal: Soft. Bowel sounds are normal. He exhibits no distension. There is no tenderness.  Musculoskeletal: Normal range of motion. He exhibits no edema and no tenderness.  Lymphadenopathy:    He has no cervical adenopathy.  Neurological: He is alert and oriented to person, place, and time. Coordination normal.  Skin: Skin is warm and dry. No rash noted. No erythema.  Psychiatric: He has a normal mood and affect. His behavior is normal. Judgment and thought content normal.      Assessment and Plan

## 2014-03-22 NOTE — Assessment & Plan Note (Signed)
Episode of chest pain in 02/26/2014. No significant episodes since that time. We have recommended that he call us if he has any additional episodes. Stress test could be ordered

## 2014-03-22 NOTE — Assessment & Plan Note (Signed)
Encouraged him to stay on his statin. Goal LDL less than 70

## 2014-03-22 NOTE — Patient Instructions (Addendum)
Your next appointment will be scheduled in our new office located at :  [facility]  [address] [place], [place] [address]   You are doing well. No medication changes were made.  Please call if you have more chest pain with exertion  Please call us if you have new issues that need to be addressed before your next appt.  Your physician wants you to follow-up in: 6 months.  You will receive a reminder letter in the mail two months in advance. If you don't receive a letter, please call our office to schedule the follow-up appointment.

## 2014-04-14 ENCOUNTER — Emergency Department (HOSPITAL_COMMUNITY): Payer: Medicare HMO

## 2014-04-14 ENCOUNTER — Emergency Department (HOSPITAL_COMMUNITY)
Admission: EM | Admit: 2014-04-14 | Discharge: 2014-04-14 | Disposition: A | Discharge status: Home / Self Care | Payer: Medicare HMO | Attending: Emergency Medicine | Admitting: Emergency Medicine

## 2014-04-14 ENCOUNTER — Encounter (HOSPITAL_COMMUNITY): Payer: Self-pay | Admitting: *Deleted

## 2014-04-14 DIAGNOSIS — I252 Old myocardial infarction: Secondary | ICD-10-CM | POA: Diagnosis not present

## 2014-04-14 DIAGNOSIS — N183 Chronic kidney disease, stage 3 (moderate): Secondary | ICD-10-CM | POA: Diagnosis not present

## 2014-04-14 DIAGNOSIS — Z7902 Long term (current) use of antithrombotics/antiplatelets: Secondary | ICD-10-CM | POA: Diagnosis not present

## 2014-04-14 DIAGNOSIS — Z7982 Long term (current) use of aspirin: Secondary | ICD-10-CM | POA: Insufficient documentation

## 2014-04-14 DIAGNOSIS — J449 Chronic obstructive pulmonary disease, unspecified: Secondary | ICD-10-CM | POA: Insufficient documentation

## 2014-04-14 DIAGNOSIS — R531 Weakness: Secondary | ICD-10-CM | POA: Insufficient documentation

## 2014-04-14 DIAGNOSIS — K219 Gastro-esophageal reflux disease without esophagitis: Secondary | ICD-10-CM | POA: Diagnosis not present

## 2014-04-14 DIAGNOSIS — R42 Dizziness and giddiness: Secondary | ICD-10-CM | POA: Insufficient documentation

## 2014-04-14 DIAGNOSIS — Z87891 Personal history of nicotine dependence: Secondary | ICD-10-CM | POA: Insufficient documentation

## 2014-04-14 DIAGNOSIS — I129 Hypertensive chronic kidney disease with stage 1 through stage 4 chronic kidney disease, or unspecified chronic kidney disease: Secondary | ICD-10-CM | POA: Diagnosis not present

## 2014-04-14 DIAGNOSIS — Z79899 Other long term (current) drug therapy: Secondary | ICD-10-CM | POA: Insufficient documentation

## 2014-04-14 DIAGNOSIS — I160 Hypertensive urgency: Secondary | ICD-10-CM

## 2014-04-14 LAB — COMPREHENSIVE METABOLIC PANEL
ALT: 31 U/L (ref 0–53)
ANION GAP: 11 (ref 5–15)
AST: 29 U/L (ref 0–37)
Albumin: 4.1 g/dL (ref 3.5–5.2)
Alkaline Phosphatase: 64 U/L (ref 39–117)
BUN: 12 mg/dL (ref 6–23)
CO2: 28 mEq/L (ref 19–32)
CREATININE: 1.92 mg/dL — AB (ref 0.50–1.35)
Calcium: 9.8 mg/dL (ref 8.4–10.5)
Chloride: 101 mEq/L (ref 96–112)
GFR calc Af Amer: 41 mL/min — ABNORMAL LOW (ref >90)
GFR calc non Af Amer: 35 mL/min — ABNORMAL LOW (ref >90)
GLUCOSE: 91 mg/dL (ref 70–99)
Potassium: 4.1 mEq/L (ref 3.7–5.3)
Sodium: 140 mEq/L (ref 137–147)
TOTAL PROTEIN: 7.3 g/dL (ref 6.0–8.3)
Total Bilirubin: 0.4 mg/dL (ref 0.3–1.2)

## 2014-04-14 LAB — CBC
HEMATOCRIT: 40 % (ref 39.0–52.0)
Hemoglobin: 13.8 g/dL (ref 13.0–17.0)
MCH: 29.9 pg (ref 26.0–34.0)
MCHC: 34.5 g/dL (ref 30.0–36.0)
MCV: 86.6 fL (ref 78.0–100.0)
PLATELETS: 300 10*3/uL (ref 150–400)
RBC: 4.62 MIL/uL (ref 4.22–5.81)
RDW: 12.2 % (ref 11.5–15.5)
WBC: 6.7 10*3/uL (ref 4.0–10.5)

## 2014-04-14 LAB — TROPONIN I: Troponin I: <0.3 ng/mL (ref <0.30)

## 2014-04-14 MED ORDER — LABETALOL HCL 5 MG/ML IV SOLN
20.0000 mg | Freq: Once | INTRAVENOUS | Status: AC
  Administered 2014-04-14: 20 mg via INTRAVENOUS
  Filled 2014-04-14: qty 4

## 2014-04-14 NOTE — ED Provider Notes (Signed)
CSN: [id number]     Arrival date & time 04/14/14  2007 History   First MD Initiated Contact with Patient 04/14/14 2010     Chief Complaint  Patient presents with  . Hypertension  . Weakness     HPI  Patient presents with generalized fatigue, no chest pain, no new dyspnea, but with additional concern of hypertension. Patient has multiple medical problem, including anxiety, CAD, with prior MI this past year. Patient has been taking all medication as directed, including beta blocker, twice daily. With best hours the patient has developed his concern as his blood pressure readings have been elevated, with a systolic almost 709, diastolic greater than 628. No recent medication changes, lifestyle changes, activity changes. Numbers did not improve with rest.   Past Medical History  Diagnosis Date  . Hypertension   . Anxiety   . COPD (chronic obstructive pulmonary disease)   . High cholesterol   . GERD (gastroesophageal reflux disease)   . Daily headache   . Chronic kidney disease (CKD), stage III (moderate)   . MI (myocardial infarction)    Past Surgical History  Procedure Laterality Date  . No past surgeries    . Cardiac catheterization      MC x 1 stent  . Coronary angioplasty     Family History  Problem Relation Age of Onset  . Coronary artery disease Brother   . Heart disease Brother 54  . Heart disease Father     MI x 8   History  Substance Use Topics  . Smoking status: Former Smoker -- 3.00 packs/day for 48 years    Types: Cigarettes    Quit date: 06/07/2010  . Smokeless tobacco: Never Used     Comment: Using electronic cigarette  . Alcohol Use: 3.6 oz/week    6 Cans of beer per week    Review of Systems  Constitutional:       Per HPI, otherwise negative  HENT:       Per HPI, otherwise negative  Respiratory:       Per HPI, otherwise negative  Cardiovascular:       Per HPI, otherwise negative  Gastrointestinal: Negative for vomiting.  Endocrine:   Negative aside from HPI  Genitourinary:       Neg aside from HPI   Musculoskeletal:       Per HPI, otherwise negative  Skin: Negative.   Neurological: Negative for syncope.      Allergies  Benzodiazepines; Serotonin reuptake inhibitors (ssris); Diazepam; Doxycycline monohydrate; Escitalopram oxalate; Lorazepam; and Tetracyclines & related  Home Medications   Prior to Admission medications   Medication Sig Start Date End Date Taking? Authorizing Provider  acetaminophen (TYLENOL) 500 MG tablet Take 1,000 mg by mouth 2 (two) times daily as needed for headache.   Yes Historical Provider, MD  albuterol (PROVENTIL HFA;VENTOLIN HFA) 108 (90 BASE) MCG/ACT inhaler Inhale 1-2 puffs into the lungs every 6 (six) hours as needed for wheezing or shortness of breath.   Yes Historical Provider, MD  ALPRAZolam (XANAX XR) 3 MG 24 hr tablet Take 3 mg by mouth daily. For anxiety control   Yes Historical Provider, MD  ALPRAZolam Duanne Moron) 1 MG tablet Take 1 mg by mouth daily as needed. For breakthrough anxiety 11/21/13  Yes Historical Provider, MD  aspirin EC 81 MG EC tablet Take 1 tablet (81 mg total) by mouth daily. 12/14/13  Yes Drucilla Schmidt, MD  DALIRESP 500 MCG TABS tablet Take 500 mcg by mouth daily. 11/09/13  Yes Historical Provider, MD  fenofibrate (TRICOR) 145 MG tablet Take 145 mg by mouth daily.   Yes Historical Provider, MD  metoprolol succinate (TOPROL-XL) 25 MG 24 hr tablet Take 12.5 mg by mouth 2 (two) times daily.   Yes Historical Provider, MD  NITROSTAT 0.4 MG SL tablet Place 0.4 mg under the tongue every 5 (five) minutes as needed. If 3rd tablet needed call 911 09/18/13  Yes Historical Provider, MD  omeprazole (PRILOSEC) 40 MG capsule Take 40 mg by mouth daily.   Yes Historical Provider, MD  pravastatin (PRAVACHOL) 80 MG tablet Take 1 tablet (80 mg total) by mouth at bedtime. 12/20/13  Yes Minna Merritts, MD  SPIRIVA HANDIHALER 18 MCG inhalation capsule Place 18 mcg into inhaler and inhale  daily. 11/27/13  Yes Historical Provider, MD  SYMBICORT 160-4.5 MCG/ACT inhaler Inhale 1 puff into the lungs 2 (two) times daily. 11/09/13  Yes Historical Provider, MD  ticagrelor (BRILINTA) 90 MG TABS tablet Take 1 tablet (90 mg total) by mouth 2 (two) times daily. 02/27/14  Yes Minna Merritts, MD  metoprolol succinate (TOPROL-XL) 25 MG 24 hr tablet Take 0.5 tablets (12.5 mg total) by mouth daily. Patient not taking: Reported on 04/14/2014 02/12/14   Minna Merritts, MD   BP 135/80 mmHg  Pulse 74  Temp(Src) 98 F (36.7 C)  Resp 19  Ht 5\' 9"  (1.753 m)  Wt 184 lb (83.462 kg)  BMI 27.16 kg/m2  SpO2 100% Physical Exam  Constitutional: He is oriented to person, place, and time. He appears well-developed. No distress.  HENT:  Head: Normocephalic and atraumatic.  Eyes: Conjunctivae and EOM are normal.  Cardiovascular: Normal rate and regular rhythm.   Pulmonary/Chest: Effort normal. No stridor. No respiratory distress.  Abdominal: He exhibits no distension.  Musculoskeletal: He exhibits no edema.  Neurological: He is alert and oriented to person, place, and time.  Skin: Skin is warm and dry.  Psychiatric: His mood appears anxious.  Nursing note and vitals reviewed.   ED Course  Procedures (including critical care time) Labs Review Labs Reviewed  COMPREHENSIVE METABOLIC PANEL - Abnormal; Notable for the following:    Creatinine, Ser 1.92 (*)    GFR calc non Af Amer 35 (*)    GFR calc Af Amer 41 (*)    All other components within normal limits  CBC  TROPONIN I    Imaging Review Dg Chest 2 View  04/14/2014   CLINICAL DATA:  Weakness, SOB, HTN 188/114Hx: Stent placed July 2015, COPD, ex-smoker 2 pk/day  EXAM: CHEST  2 VIEW  COMPARISON:  02/26/2014  FINDINGS: Cardiac silhouette is normal size and configuration. There is a coronary artery stent, stable. No mediastinal or hilar masses or evidence of adenopathy.  Lungs are hyperexpanded but clear. No pleural effusion or pneumothorax.  Bony  thorax is demineralized. There is a mild stable compression deformity of a mid thoracic vertebrae.  IMPRESSION: No acute cardiopulmonary disease. COPD. Stable appearance from the prior study.   Electronically Signed   By: Lajean Manes M.D.   On: 04/14/2014 21:22     EKG Interpretation   Date/Time:  Sunday April 14 2014 20:15:11 EST Ventricular Rate:  85 PR Interval:  144 QRS Duration: 89 QT Interval:  360 QTC Calculation: 428 R Axis:   75 Text Interpretation:  Sinus rhythm Baseline wander in lead(s) V2 Sinus  rhythm Normal ECG Confirmed by ,   MD ([id number]) on 04/14/2014  8:37:27 PM     10 :17  PM Patient in no distress.  VS notable for 135/80  He and wife aware of all results, F/U instructions, return precautions.  MDM  Patient presents with multiple concerns, but essentially has generalized discomfort, dizziness, dyspnea. On exam patient is awake and alert, hemodynamically stable, neurologically intact, though blood pressure is slightly elevated. Patient's blood pressure results here with one additional dose of beta blocker. Patient has no new complaints, has resolution of other symptoms, was discharged to follow-up with primary care and nephrology.    Carmin Muskrat, MD 04/14/14 2219

## 2014-04-14 NOTE — Discharge Instructions (Signed)
As discussed, it is important that you follow up as soon as possible with your physician for continued management of your condition. ° °If you develop any new, or concerning changes in your condition, please return to the emergency department immediately. ° °

## 2014-04-14 NOTE — ED Notes (Signed)
Dr. Vanita Panda finished at [facility].

## 2014-04-14 NOTE — ED Notes (Signed)
Here from home with wife, here for high BP, "just not feeling right" and general weakness also admits bilateral thigh pain, sob and dizziness. (denies CP, vision changes, nvd, fever, cough, congestion, cold sx or HA), denies thigh pain at this time, "just weakness". Took half tab of metoprolol PTA, mentions MI with stent in July.

## 2014-04-14 NOTE — ED Notes (Signed)
Pt to xray

## 2014-05-04 ENCOUNTER — Emergency Department (HOSPITAL_COMMUNITY): Payer: Medicare HMO

## 2014-05-04 ENCOUNTER — Encounter (HOSPITAL_COMMUNITY): Payer: Self-pay | Admitting: Emergency Medicine

## 2014-05-04 ENCOUNTER — Inpatient Hospital Stay (HOSPITAL_COMMUNITY)
Admission: EM | Admit: 2014-05-04 | Discharge: 2014-05-04 | DRG: 303 | Disposition: A | Discharge status: Home / Self Care | Payer: Medicare HMO | Attending: Cardiology | Admitting: Cardiology

## 2014-05-04 DIAGNOSIS — Z7982 Long term (current) use of aspirin: Secondary | ICD-10-CM

## 2014-05-04 DIAGNOSIS — I129 Hypertensive chronic kidney disease with stage 1 through stage 4 chronic kidney disease, or unspecified chronic kidney disease: Secondary | ICD-10-CM | POA: Diagnosis present

## 2014-05-04 DIAGNOSIS — Z9861 Coronary angioplasty status: Secondary | ICD-10-CM | POA: Diagnosis not present

## 2014-05-04 DIAGNOSIS — I959 Hypotension, unspecified: Secondary | ICD-10-CM

## 2014-05-04 DIAGNOSIS — R911 Solitary pulmonary nodule: Secondary | ICD-10-CM | POA: Diagnosis present

## 2014-05-04 DIAGNOSIS — E780 Pure hypercholesterolemia: Secondary | ICD-10-CM | POA: Diagnosis present

## 2014-05-04 DIAGNOSIS — K219 Gastro-esophageal reflux disease without esophagitis: Secondary | ICD-10-CM | POA: Diagnosis present

## 2014-05-04 DIAGNOSIS — M79604 Pain in right leg: Secondary | ICD-10-CM | POA: Diagnosis present

## 2014-05-04 DIAGNOSIS — M79605 Pain in left leg: Secondary | ICD-10-CM | POA: Diagnosis present

## 2014-05-04 DIAGNOSIS — Z79899 Other long term (current) drug therapy: Secondary | ICD-10-CM | POA: Diagnosis not present

## 2014-05-04 DIAGNOSIS — I252 Old myocardial infarction: Secondary | ICD-10-CM | POA: Diagnosis not present

## 2014-05-04 DIAGNOSIS — N183 Chronic kidney disease, stage 3 unspecified: Secondary | ICD-10-CM | POA: Diagnosis present

## 2014-05-04 DIAGNOSIS — F419 Anxiety disorder, unspecified: Secondary | ICD-10-CM | POA: Diagnosis present

## 2014-05-04 DIAGNOSIS — I200 Unstable angina: Secondary | ICD-10-CM | POA: Diagnosis present

## 2014-05-04 DIAGNOSIS — I2510 Atherosclerotic heart disease of native coronary artery without angina pectoris: Principal | ICD-10-CM | POA: Diagnosis present

## 2014-05-04 DIAGNOSIS — R93429 Abnormal radiologic findings on diagnostic imaging of unspecified kidney: Secondary | ICD-10-CM | POA: Diagnosis present

## 2014-05-04 DIAGNOSIS — N189 Chronic kidney disease, unspecified: Secondary | ICD-10-CM

## 2014-05-04 DIAGNOSIS — R079 Chest pain, unspecified: Secondary | ICD-10-CM

## 2014-05-04 DIAGNOSIS — I25110 Atherosclerotic heart disease of native coronary artery with unstable angina pectoris: Secondary | ICD-10-CM | POA: Diagnosis present

## 2014-05-04 DIAGNOSIS — Z87891 Personal history of nicotine dependence: Secondary | ICD-10-CM | POA: Diagnosis not present

## 2014-05-04 DIAGNOSIS — I25118 Atherosclerotic heart disease of native coronary artery with other forms of angina pectoris: Secondary | ICD-10-CM | POA: Diagnosis present

## 2014-05-04 DIAGNOSIS — J449 Chronic obstructive pulmonary disease, unspecified: Secondary | ICD-10-CM | POA: Diagnosis present

## 2014-05-04 DIAGNOSIS — IMO0001 Reserved for inherently not codable concepts without codable children: Secondary | ICD-10-CM

## 2014-05-04 LAB — I-STAT TROPONIN, ED: TROPONIN I, POC: 0 ng/mL (ref 0.00–0.08)

## 2014-05-04 LAB — CBC
HCT: 38.8 % — ABNORMAL LOW (ref 39.0–52.0)
Hemoglobin: 13.2 g/dL (ref 13.0–17.0)
MCH: 29.7 pg (ref 26.0–34.0)
MCHC: 34 g/dL (ref 30.0–36.0)
MCV: 87.2 fL (ref 78.0–100.0)
Platelets: 260 10*3/uL (ref 150–400)
RBC: 4.45 MIL/uL (ref 4.22–5.81)
RDW: 12.3 % (ref 11.5–15.5)
WBC: 6.6 10*3/uL (ref 4.0–10.5)

## 2014-05-04 LAB — CK: Total CK: 137 U/L (ref 7–232)

## 2014-05-04 LAB — BASIC METABOLIC PANEL
Anion gap: 11 (ref 5–15)
BUN: 14 mg/dL (ref 6–23)
CALCIUM: 9.2 mg/dL (ref 8.4–10.5)
CO2: 27 mEq/L (ref 19–32)
CREATININE: 1.94 mg/dL — AB (ref 0.50–1.35)
Chloride: 101 mEq/L (ref 96–112)
GFR calc non Af Amer: 35 mL/min — ABNORMAL LOW (ref >90)
GFR, EST AFRICAN AMERICAN: 40 mL/min — AB (ref >90)
Glucose, Bld: 92 mg/dL (ref 70–99)
Potassium: 3.8 mEq/L (ref 3.7–5.3)
Sodium: 139 mEq/L (ref 137–147)

## 2014-05-04 MED ORDER — ACETAMINOPHEN 500 MG PO TABS
1000.0000 mg | ORAL_TABLET | Freq: Two times a day (BID) | ORAL | Status: DC | PRN
  Administered 2014-05-04: 1000 mg via ORAL
  Filled 2014-05-04: qty 2

## 2014-05-04 MED ORDER — KETOROLAC TROMETHAMINE 30 MG/ML IJ SOLN: 30.0000 mg | Freq: Once | INTRAMUSCULAR | Status: DC

## 2014-05-04 MED ORDER — MORPHINE SULFATE 4 MG/ML IJ SOLN
4.0000 mg | Freq: Once | INTRAMUSCULAR | Status: DC
  Filled 2014-05-04: qty 1

## 2014-05-04 NOTE — ED Notes (Addendum)
Pt stating he does not want to be admitted. Pt educated on the risks and benefits of leaving AMA. Pt A&Ox4 and able to make his own health care decisions. Cardiology and EDP aware.

## 2014-05-04 NOTE — ED Notes (Signed)
Cardiology at bedside.

## 2014-05-04 NOTE — H&P (Signed)
CARDIOLOGY HISTORY AND PHYSICAL   Patient ID: William Mueller MRN: [id number]  DOB/AGE: 05-Oct-1950 63 y.o. Admit date: 05/04/2014  Primary Care Physician: Leonides Sake, MD Primary Cardiologist:  Freida Busman  Clinical Summary William Mueller is a 63 y.o.male. He is followed by [facility] Done with coronary disease. He received a stent July, 2015. He's been stable since then. More recently he has noticed aching in his legs. This occurs more at rest than stress. It is possible that this could be related to his statin. It does not sound like claudication. He has not been having any significant exertional symptoms. This morning around 5 AM he developed chest pressure. This was not as severe as his original symptom in July. However it was somewhat similar. His pressure was in the range of 80 systolic. Despite this he took a nitroglycerin at home. He did have diaphoresis. Here in the emergency room he is stable.   Allergies  Allergen Reactions  . Benzodiazepines Anaphylaxis and Shortness Of Breath  . Serotonin Reuptake Inhibitors (Ssris) Shortness Of Breath and Palpitations  . Diazepam     Other reaction(s): Unknown  . Doxycycline Monohydrate     Other reaction(s): Unknown  . Escitalopram Oxalate     Other reaction(s): Other (See Comments) serotonin Syndrome  . Lorazepam     Other reaction(s): Unknown  . Tetracyclines & Related Itching and Rash    Home Medications  (Not in a hospital admission)  Scheduled Medications    Infusions    PRN Medications       Past Medical History  Diagnosis Date  . Hypertension   . Anxiety   . COPD (chronic obstructive pulmonary disease)   . High cholesterol   . GERD (gastroesophageal reflux disease)   . Daily headache   . Chronic kidney disease (CKD), stage III (moderate)   . MI (myocardial infarction)     Past Surgical History  Procedure Laterality Date  . No past surgeries    . Cardiac catheterization      MC x 1 stent  .  Coronary angioplasty      Family History  Problem Relation Age of Onset  . Coronary artery disease Brother   . Heart disease Brother 2  . Heart disease Father     MI x 8    Social History William Mueller reports that he quit smoking about 3 years ago. His smoking use included Cigarettes. He has a 144 pack-year smoking history. He has never used smokeless tobacco. William Mueller reports that he drinks about 3.6 oz of alcohol per week.  Review of Systems Patient denies fever, chills, headache, sweats, rash, change in vision, change in hearing, cough, nausea or vomiting, urinary symptoms. All other systems are reviewed and are negative.  Physical Examination Temp:  [98.1 F (36.7 C)] 98.1 F (36.7 C) (11/28 0731) Pulse Rate:  [64-77] 77 (11/28 1123) Resp:  [10-17] 15 (11/28 1123) BP: (106-125)/(45-74) 120/66 mmHg (11/28 1123) SpO2:  [99 %-100 %] 100 % (11/28 1123) Weight:  [188 lb (85.276 kg)] 188 lb (85.276 kg) (11/28 0731) No intake or output data in the 24 hours ending 05/04/14 1142  Patient is oriented to person time and place. Affect is normal. He is here with his wife. Head is atraumatic. Sclera and conjunctiva are normal. There is no jugular venous distention. Lungs are clear. Respiratory effort is not labored. Cardiac exam reveals S1 and S2. Abdomen is softer there is no peripheral edema. There are no musculoskeletal deformities.  No skin rashes.   Lab Results  Basic Metabolic Panel:  Recent Labs Lab 05/04/14 0734  NA 139  K 3.8  CL 101  CO2 27  GLUCOSE 92  BUN 14  CREATININE 1.94*  CALCIUM 9.2    Liver Function Tests: No results for input(s): AST, ALT, ALKPHOS, BILITOT, PROT, ALBUMIN in the last 168 hours.  CBC:  Recent Labs Lab 05/04/14 0734  WBC 6.6  HGB 13.2  HCT 38.8*  MCV 87.2  PLT 260    Cardiac Enzymes: No results for input(s): CKTOTAL, CKMB, CKMBINDEX, TROPONINI in the last 168 hours.  BNP: Invalid input(s): POCBNP   Radiology Ct Abdomen  Pelvis Wo Contrast  05/04/2014   CLINICAL DATA:  63 year old with bilateral leg pain and generalized weakness. Evaluate for aneurysm. Hypotension. Chronic kidney disease.  EXAM: CT ABDOMEN AND PELVIS WITHOUT CONTRAST  TECHNIQUE: Multidetector CT imaging of the abdomen and pelvis was performed following the standard protocol without IV contrast.  COMPARISON:  None.  FINDINGS: 3 mm nodule in the left lower lobe on sequence 3, image 5. Otherwise, the lung bases are clear. Negative for free air.  Mild perinephric stranding or edema bilaterally. Findings may be related to chronic kidney disease. There is a vague 1.4 cm hyperdense structure along the left kidney upper pole, best seen on sequence 6, image 135. This could represent an hemorrhagic cyst but nonspecific. There is no evidence for kidney stones or hydronephrosis. Moderate distention of the urinary bladder. No acute abnormality to the liver, gallbladder, spleen, pancreas or adrenal glands. Atherosclerotic calcifications in the abdominal aorta without aneurysm. Normal appearance of the stomach and small bowel. No evidence for free fluid or lymphadenopathy.  The prostate is mildly prominent, measuring 4.6 cm in the transverse dimension. Normal appearance of the colon and appendix.  No acute bone abnormality.  IMPRESSION: No acute abnormality in the abdomen or pelvis.  There is a vague 1.4 cm hyperdense structure in the left kidney upper pole. Finding is nonspecific but a hemorrhagic cyst is in the differential diagnosis. Consider a non emergent renal ultrasound for further characterization. Mild perinephric edema/stranding could be related to history of chronic kidney disease.  3 mm nodule in the left lower lobe. If the patient is at high risk for bronchogenic carcinoma, follow-up chest CT at 1 year is recommended. If the patient is at low risk, no follow-up is needed. This recommendation follows the consensus statement: Guidelines for Management of Small  Pulmonary Nodules Detected on CT Scans: A Statement from the [place] as published in Radiology 2005; 237:395-400.   Electronically Signed   By: Markus Daft M.D.   On: 05/04/2014 09:54   Dg Chest Port 1 View  05/04/2014   CLINICAL DATA:  Bilateral leg pain this morning. Substernal chest pain. History of cardiac stent placement.  EXAM: PORTABLE CHEST - 1 VIEW  COMPARISON:  04/14/2014  FINDINGS: Single view of the chest demonstrates clear lungs. Heart and mediastinum are within normal limits. The costophrenic angles are incompletely imaged. Trachea is midline. No acute bone abnormality.  IMPRESSION: No acute chest findings.   Electronically Signed   By: Markus Daft M.D.   On: 05/04/2014 08:27    Prior Cardiac Testing/Procedures:   ECG  today's EKG reveals no significant change. There is no significant abnormality.   Impression and Recommendations    Coronary atherosclerosis of native coronary artery /  significant chest pain today     Patient had chest pain today at 5 AM. He  had diaphoresis with it. He says that his pressure was low and that following that he took a nitroglycerin. He is now pain-free. His pain was similar to the pain that he had with his acute coronary episode in July, 2015. It was not as severe. So far troponin is negative. EKG reveals no acute change. He is admitted to further watch his troponins and evaluate his status. I have started IV heparin.    CKD (chronic kidney disease) stage 3, GFR 30-59 ml/min     The patient has significant renal disease. We will have to follow this carefully.    Bilateral leg pain    Etiology of his pain is not clear. It does not seem consistent with claudication. He is on statin and fenofibrate. We may have to hold these. I will wait until after his acute coronary workup is done. We will check total CPK.    Lung nodule < 6cm on CT     3 mm nodule is noted on his CT scan today. He will need follow-up in one year.    Abnormal CT scan,  kidney    There is a vague abnormality on the left kidney. Radiology suggests the possibility of proceeding with a renal ultrasound on an elective basis.  Daryel November, MD 05/04/2014, 11:42 AM

## 2014-05-04 NOTE — ED Provider Notes (Signed)
CSN: [id number]     Arrival date & time 05/04/14  6226 History   None    Chief Complaint  Patient presents with  . Chest Pain  . Leg Pain   William Mueller is a 63 y.o. male with a history of NSTEMI with stents in occluded RCA in July 2015, HTN, COPD, HLD, and CKD presenting with chest pain and leg pain.   Pain is gone now, was present around 5:00am this morning while in bed, substernal, associated with SOB, non-radiating, sharp, severe, lasting about 45 minutes. He became diaphoretic, BP measurement was 80s/40s and then he took NTG with complete relief of symptoms. He called EMS and was told to take 4 aspirin which he did. Currently no chest pain or shortness of breath, palpitations, HA, vision changes, dizziness, abd pain, N/V/D. Of note, he has taken cialis for ED the past 2 days.  Had leg pain overnight limiting sleep. Bilateral non-radiating, achy, mod-severe L > R for the past 6 months or so. Symptoms of cramping/aching only appear when at rest (e.g. sitting watching TV, in bed) not during walking or other activities (walks with wife 0.5-1.0 miles at a time without symptoms).  (Consider location/radiation/quality/duration/timing/severity/associated sxs/prior Treatment) HPI  Past Medical History  Diagnosis Date  . Hypertension   . Anxiety   . COPD (chronic obstructive pulmonary disease)   . High cholesterol   . GERD (gastroesophageal reflux disease)   . Daily headache   . Chronic kidney disease (CKD), stage III (moderate)   . MI (myocardial infarction)    Past Surgical History  Procedure Laterality Date  . No past surgeries    . Cardiac catheterization      MC x 1 stent  . Coronary angioplasty     Family History  Problem Relation Age of Onset  . Coronary artery disease Brother   . Heart disease Brother 67  . Heart disease Father     MI x 8   History  Substance Use Topics  . Smoking status: Former Smoker -- 3.00 packs/day for 48 years    Types: Cigarettes    Quit  date: 06/07/2010  . Smokeless tobacco: Never Used     Comment: Using electronic cigarette  . Alcohol Use: 3.6 oz/week    6 Cans of beer per week    Review of Systems  Constitutional: Negative for fever, chills and fatigue.  HENT: Negative for hearing loss and tinnitus.   Eyes: Negative for visual disturbance.  Respiratory: Negative for cough, shortness of breath (none currently) and wheezing.   Cardiovascular: Negative for chest pain (none currently), palpitations and leg swelling.  Gastrointestinal: Negative for nausea, vomiting, abdominal pain, diarrhea and blood in stool.  Genitourinary: Negative for dysuria, urgency, frequency and hematuria.  Musculoskeletal: Negative for myalgias, arthralgias and neck pain.  Skin: Negative for color change, pallor and rash.  Neurological: Negative for dizziness, tremors, syncope and headaches.  Psychiatric/Behavioral: Positive for sleep disturbance (difficulty getting to sleep.).   Allergies  Benzodiazepines; Serotonin reuptake inhibitors (ssris); Diazepam; Doxycycline monohydrate; Escitalopram oxalate; Lorazepam; and Tetracyclines & related  Home Medications   Prior to Admission medications   Medication Sig Start Date End Date Taking? Authorizing Provider  acetaminophen (TYLENOL) 500 MG tablet Take 1,000 mg by mouth 2 (two) times daily as needed for headache.   Yes Historical Provider, MD  ALPRAZolam (XANAX XR) 3 MG 24 hr tablet Take 3 mg by mouth daily. For anxiety control   Yes Historical Provider, MD  ALPRAZolam Duanne Moron)  1 MG tablet Take 1 mg by mouth daily as needed. For breakthrough anxiety 11/21/13  Yes Historical Provider, MD  aspirin EC 81 MG EC tablet Take 1 tablet (81 mg total) by mouth daily. 12/14/13  Yes Drucilla Schmidt, MD  DALIRESP 500 MCG TABS tablet Take 500 mcg by mouth daily. 11/09/13  Yes Historical Provider, MD  fenofibrate (TRICOR) 145 MG tablet Take 145 mg by mouth daily.   Yes Historical Provider, MD  metoprolol succinate  (TOPROL-XL) 25 MG 24 hr tablet Take 25 mg by mouth daily.    Yes Historical Provider, MD  NITROSTAT 0.4 MG SL tablet Place 0.4 mg under the tongue every 5 (five) minutes as needed. If 3rd tablet needed call 911 09/18/13  Yes Historical Provider, MD  omeprazole (PRILOSEC) 40 MG capsule Take 40 mg by mouth daily.   Yes Historical Provider, MD  pravastatin (PRAVACHOL) 80 MG tablet Take 1 tablet (80 mg total) by mouth at bedtime. 12/20/13  Yes Minna Merritts, MD  SPIRIVA HANDIHALER 18 MCG inhalation capsule Place 18 mcg into inhaler and inhale daily. 11/27/13  Yes Historical Provider, MD  SYMBICORT 160-4.5 MCG/ACT inhaler Inhale 1 puff into the lungs 2 (two) times daily. 11/09/13  Yes Historical Provider, MD  tadalafil (CIALIS) 5 MG tablet Take 5 mg by mouth daily.   Yes Historical Provider, MD  ticagrelor (BRILINTA) 90 MG TABS tablet Take 1 tablet (90 mg total) by mouth 2 (two) times daily. 02/27/14  Yes Minna Merritts, MD  albuterol (PROVENTIL HFA;VENTOLIN HFA) 108 (90 BASE) MCG/ACT inhaler Inhale 1-2 puffs into the lungs every 6 (six) hours as needed for wheezing or shortness of breath.    Historical Provider, MD   BP 107/54 mmHg  Pulse 64  Temp(Src) 98.1 F (36.7 C) (Oral)  Resp 10  Ht 5\' 9"  (1.753 m)  Wt 188 lb (85.276 kg)  BMI 27.75 kg/m2  SpO2 100% Physical Exam  Constitutional: He is oriented to person, place, and time. He appears well-developed and well-nourished. No distress.  Eyes: EOM are normal. Pupils are equal, round, and reactive to light. No scleral icterus.  Neck: Neck supple. No JVD present.  Cardiovascular: Normal rate, regular rhythm, normal heart sounds and intact distal pulses.   No murmur heard. radial, femoral and DP pulses all intact. L DP < R DP but still palpable  Pulmonary/Chest: Effort normal and breath sounds normal. No respiratory distress.  Abdominal: Soft. Bowel sounds are normal. He exhibits no distension. There is no tenderness.  no abd bruit   Musculoskeletal: Normal range of motion. He exhibits no edema or tenderness.  Lymphadenopathy:    He has no cervical adenopathy.  Neurological: He is alert and oriented to person, place, and time. He exhibits normal muscle tone.  Skin: Skin is warm and dry.  cap refill < 2 sec, no cyanosis or clubbing.   Vitals reviewed.   ED Course  Procedures (including critical care time) Labs Review Labs Reviewed  BASIC METABOLIC PANEL - Abnormal; Notable for the following:    Creatinine, Ser 1.94 (*)    GFR calc non Af Amer 35 (*)    GFR calc Af Amer 40 (*)    All other components within normal limits  CBC - Abnormal; Notable for the following:    HCT 38.8 (*)    All other components within normal limits  I-STAT TROPOININ, ED    Imaging Review Ct Abdomen Pelvis Wo Contrast  05/04/2014   CLINICAL DATA:  63 year old with  bilateral leg pain and generalized weakness. Evaluate for aneurysm. Hypotension. Chronic kidney disease.  EXAM: CT ABDOMEN AND PELVIS WITHOUT CONTRAST  TECHNIQUE: Multidetector CT imaging of the abdomen and pelvis was performed following the standard protocol without IV contrast.  COMPARISON:  None.  FINDINGS: 3 mm nodule in the left lower lobe on sequence 3, image 5. Otherwise, the lung bases are clear. Negative for free air.  Mild perinephric stranding or edema bilaterally. Findings may be related to chronic kidney disease. There is a vague 1.4 cm hyperdense structure along the left kidney upper pole, best seen on sequence 6, image 135. This could represent an hemorrhagic cyst but nonspecific. There is no evidence for kidney stones or hydronephrosis. Moderate distention of the urinary bladder. No acute abnormality to the liver, gallbladder, spleen, pancreas or adrenal glands. Atherosclerotic calcifications in the abdominal aorta without aneurysm. Normal appearance of the stomach and small bowel. No evidence for free fluid or lymphadenopathy.  The prostate is mildly prominent,  measuring 4.6 cm in the transverse dimension. Normal appearance of the colon and appendix.  No acute bone abnormality.  IMPRESSION: No acute abnormality in the abdomen or pelvis.  There is a vague 1.4 cm hyperdense structure in the left kidney upper pole. Finding is nonspecific but a hemorrhagic cyst is in the differential diagnosis. Consider a non emergent renal ultrasound for further characterization. Mild perinephric edema/stranding could be related to history of chronic kidney disease.  3 mm nodule in the left lower lobe. If the patient is at high risk for bronchogenic carcinoma, follow-up chest CT at 1 year is recommended. If the patient is at low risk, no follow-up is needed. This recommendation follows the consensus statement: Guidelines for Management of Small Pulmonary Nodules Detected on CT Scans: A Statement from the [employer] as published in Radiology 2005; 237:395-400.   Electronically Signed   By: Markus Daft M.D.   On: 05/04/2014 09:54   Dg Chest Port 1 View  05/04/2014   CLINICAL DATA:  Bilateral leg pain this morning. Substernal chest pain. History of cardiac stent placement.  EXAM: PORTABLE CHEST - 1 VIEW  COMPARISON:  04/14/2014  FINDINGS: Single view of the chest demonstrates clear lungs. Heart and mediastinum are within normal limits. The costophrenic angles are incompletely imaged. Trachea is midline. No acute bone abnormality.  IMPRESSION: No acute chest findings.   Electronically Signed   By: Markus Daft M.D.   On: 05/04/2014 08:27     EKG Interpretation   Date/Time:  Saturday May 04 2014 07:28:49 EST Ventricular Rate:  70 PR Interval:  153 QRS Duration: 92 QT Interval:  401 QTC Calculation: 433 R Axis:   67 Text Interpretation:  Sinus rhythm normal.no change Confirmed by Johnney Killian,  MD, Jeannie Done [phone]) on 05/04/2014 9:09:29 AM      MDM   Final diagnoses:  Hypotension  Hypotension   63 y.o. male with multiple risk factors and previous NSTEMI s/p PCI with  stent placement in July presenting with atypical chest pain (substernal, nonexertional, relieved by NTG) associated with SOB and hypotension (80s/40s). Negative troponin and ECG here and chest pain continues to be resolved. Will consult cardiology to discuss further management.   Bilateral leg pain in setting of known atherosclerotic disease and diminished LE pulses suggestive of PAD though per the history all his symptoms are ONLY at rest and not typical of claudication.   CT abdomen was negative for aortic aneurysm.     Patrecia Pour, MD 05/04/14 [phone]  Patrecia Pour, MD 05/04/14 1204  I saw and evaluated the patient, reviewed the resident's note and I agree with the findings and plan.   EKG Interpretation   Date/Time:  Saturday May 04 2014 07:28:49 EST Ventricular Rate:  70 PR Interval:  153 QRS Duration: 92 QT Interval:  401 QTC Calculation: 433 R Axis:   67 Text Interpretation:  Sinus rhythm normal.no change Confirmed by Johnney Killian,  MD, Jeannie Done [phone]) on 05/04/2014 9:09:29 AM     Patient history reviewed by myself. Leg pain improved with exertion and typically brought on by being supine. Concerning CP reported associated with hypotension (by patient report) BEFORE  he tried NTG. Patient has significant cardiac risk factors. Cardiology consulted. After admission arranged, patient decided to sign out AMA. Discussed with patient. He felt better and didn't wish to stay in hospital. Counselled of risk of silent MI and out of hospital arrest. Accepts risk of death or permanent disability associated with AMA.  Charlesetta Shanks, MD 05/07/14 [phone]

## 2014-05-04 NOTE — ED Notes (Signed)
Pt and family educated on risks of taking cialis and also taking a nitro while taking this medication. Pt and family verbalized understanding.

## 2014-05-04 NOTE — Discharge Instructions (Signed)
Rejection of Medical Treatment, Against Medical Advice Medical examination, treatment, or testing has been recommended for me. I have decided to reject further treatment or medical evaluation, and will leave the facility. I am rejecting medical care of my own choice, and contrary to the instructions and wishes of __________________________________________________, the treating physician. I understand that permanent harm, or even death, can occur from failing to follow the recommendations of the physician. I have had an opportunity to ask questions and fully understand my medical condition. I have been advised that I may return at any time to continue medical evaluation or treatment.  Because I am rejecting recommended medical care, I agree to absolve and release the physician and the medical facility from any and all liabilities for damages arising from any current medical condition. I accept all risk associated with my medical condition, both known and unknown. I assume all responsibility for this action. I agree that I will make no claim of any nature against this medical facility or the physician under any circumstances. __________________________________________________ Name of Patient __________________________________________________ Signature of Patient or Guardian of Same __________________________________________________ Date Document Released: 05/24/2005 Document Revised: 08/16/2011 Document Reviewed: 01/11/2008 Exit[employer] Patient Information 2015 [place], [place]. This information is not intended to replace advice given to you by your health care provider. Make sure you discuss any questions you have with your health care provider.

## 2014-05-04 NOTE — ED Notes (Signed)
Per EMS: Pt was at home this morning when he developed bilateral leg pain followed by substernal cp with no radiation. Pt states he became weak and dizzy, took 324 mg of aspirin and 1 nitro at home with minimal relief. EMS noted pt to be pain free upon arrival, initial bp of 80/50 with EMS. nad noted axox4. Pt only c/o leg pain at this time

## 2014-05-16 ENCOUNTER — Encounter (HOSPITAL_COMMUNITY): Payer: Self-pay | Admitting: Cardiovascular Disease

## 2014-06-08 ENCOUNTER — Emergency Department (HOSPITAL_COMMUNITY): Payer: Commercial Managed Care - HMO

## 2014-06-08 ENCOUNTER — Inpatient Hospital Stay (HOSPITAL_COMMUNITY)
Admission: EM | Admit: 2014-06-08 | Discharge: 2014-06-09 | DRG: 313 | Disposition: A | Discharge status: Home / Self Care | Payer: Commercial Managed Care - HMO | Attending: Internal Medicine | Admitting: Internal Medicine

## 2014-06-08 ENCOUNTER — Encounter (HOSPITAL_COMMUNITY): Payer: Self-pay | Admitting: *Deleted

## 2014-06-08 DIAGNOSIS — F419 Anxiety disorder, unspecified: Secondary | ICD-10-CM | POA: Diagnosis present

## 2014-06-08 DIAGNOSIS — R079 Chest pain, unspecified: Principal | ICD-10-CM

## 2014-06-08 DIAGNOSIS — Z8249 Family history of ischemic heart disease and other diseases of the circulatory system: Secondary | ICD-10-CM | POA: Diagnosis not present

## 2014-06-08 DIAGNOSIS — I214 Non-ST elevation (NSTEMI) myocardial infarction: Secondary | ICD-10-CM | POA: Diagnosis present

## 2014-06-08 DIAGNOSIS — Z955 Presence of coronary angioplasty implant and graft: Secondary | ICD-10-CM | POA: Diagnosis not present

## 2014-06-08 DIAGNOSIS — Z881 Allergy status to other antibiotic agents status: Secondary | ICD-10-CM

## 2014-06-08 DIAGNOSIS — I2510 Atherosclerotic heart disease of native coronary artery without angina pectoris: Secondary | ICD-10-CM | POA: Diagnosis present

## 2014-06-08 DIAGNOSIS — R911 Solitary pulmonary nodule: Secondary | ICD-10-CM | POA: Diagnosis present

## 2014-06-08 DIAGNOSIS — I129 Hypertensive chronic kidney disease with stage 1 through stage 4 chronic kidney disease, or unspecified chronic kidney disease: Secondary | ICD-10-CM | POA: Diagnosis present

## 2014-06-08 DIAGNOSIS — N183 Chronic kidney disease, stage 3 unspecified: Secondary | ICD-10-CM | POA: Diagnosis present

## 2014-06-08 DIAGNOSIS — R0789 Other chest pain: Secondary | ICD-10-CM | POA: Diagnosis present

## 2014-06-08 DIAGNOSIS — E785 Hyperlipidemia, unspecified: Secondary | ICD-10-CM | POA: Diagnosis present

## 2014-06-08 DIAGNOSIS — Z87891 Personal history of nicotine dependence: Secondary | ICD-10-CM

## 2014-06-08 DIAGNOSIS — Z9109 Other allergy status, other than to drugs and biological substances: Secondary | ICD-10-CM | POA: Diagnosis not present

## 2014-06-08 DIAGNOSIS — I252 Old myocardial infarction: Secondary | ICD-10-CM

## 2014-06-08 DIAGNOSIS — Z7951 Long term (current) use of inhaled steroids: Secondary | ICD-10-CM | POA: Diagnosis not present

## 2014-06-08 DIAGNOSIS — Z79899 Other long term (current) drug therapy: Secondary | ICD-10-CM

## 2014-06-08 DIAGNOSIS — J449 Chronic obstructive pulmonary disease, unspecified: Secondary | ICD-10-CM | POA: Diagnosis present

## 2014-06-08 DIAGNOSIS — K210 Gastro-esophageal reflux disease with esophagitis: Secondary | ICD-10-CM

## 2014-06-08 DIAGNOSIS — K219 Gastro-esophageal reflux disease without esophagitis: Secondary | ICD-10-CM | POA: Diagnosis present

## 2014-06-08 DIAGNOSIS — IMO0001 Reserved for inherently not codable concepts without codable children: Secondary | ICD-10-CM | POA: Diagnosis present

## 2014-06-08 DIAGNOSIS — I10 Essential (primary) hypertension: Secondary | ICD-10-CM | POA: Diagnosis present

## 2014-06-08 LAB — CBC
HCT: 39.9 % (ref 39.0–52.0)
Hemoglobin: 13.9 g/dL (ref 13.0–17.0)
MCH: 30.3 pg (ref 26.0–34.0)
MCHC: 34.8 g/dL (ref 30.0–36.0)
MCV: 86.9 fL (ref 78.0–100.0)
Platelets: 308 10*3/uL (ref 150–400)
RBC: 4.59 MIL/uL (ref 4.22–5.81)
RDW: 12.2 % (ref 11.5–15.5)
WBC: 6.1 10*3/uL (ref 4.0–10.5)

## 2014-06-08 LAB — BASIC METABOLIC PANEL
ANION GAP: 9 (ref 5–15)
BUN: 15 mg/dL (ref 6–23)
CO2: 25 mmol/L (ref 19–32)
Calcium: 9.8 mg/dL (ref 8.4–10.5)
Chloride: 103 mEq/L (ref 96–112)
Creatinine, Ser: 1.87 mg/dL — ABNORMAL HIGH (ref 0.50–1.35)
GFR calc Af Amer: 42 mL/min — ABNORMAL LOW (ref >90)
GFR, EST NON AFRICAN AMERICAN: 36 mL/min — AB (ref >90)
Glucose, Bld: 108 mg/dL — ABNORMAL HIGH (ref 70–99)
POTASSIUM: 3.9 mmol/L (ref 3.5–5.1)
SODIUM: 137 mmol/L (ref 135–145)

## 2014-06-08 LAB — I-STAT TROPONIN, ED
TROPONIN I, POC: 0.01 ng/mL (ref 0.00–0.08)
Troponin i, poc: 0 ng/mL (ref 0.00–0.08)

## 2014-06-08 LAB — TROPONIN I: Troponin I: <0.03 ng/mL (ref <0.031)

## 2014-06-08 LAB — BRAIN NATRIURETIC PEPTIDE: B NATRIURETIC PEPTIDE 5: 23.8 pg/mL (ref 0.0–100.0)

## 2014-06-08 LAB — PROTIME-INR
INR: 1.01 (ref 0.00–1.49)
PROTHROMBIN TIME: 13.4 s (ref 11.6–15.2)

## 2014-06-08 MED ORDER — FENOFIBRATE 160 MG PO TABS
160.0000 mg | ORAL_TABLET | Freq: Every day | ORAL | Status: DC
  Administered 2014-06-08: 160 mg via ORAL
  Filled 2014-06-08 (×2): qty 1

## 2014-06-08 MED ORDER — ALPRAZOLAM 0.5 MG PO TABS
3.0000 mg | ORAL_TABLET | Freq: Every day | ORAL | Status: DC
  Administered 2014-06-08: 2 mg via ORAL
  Filled 2014-06-08: qty 6

## 2014-06-08 MED ORDER — MUSCLE RUB 10-15 % EX CREA
TOPICAL_CREAM | Freq: Every day | CUTANEOUS | Status: DC | PRN
  Filled 2014-06-08: qty 85

## 2014-06-08 MED ORDER — TIOTROPIUM BROMIDE MONOHYDRATE 18 MCG IN CAPS
18.0000 ug | ORAL_CAPSULE | Freq: Every day | RESPIRATORY_TRACT | Status: DC
  Administered 2014-06-09: 18 ug via RESPIRATORY_TRACT
  Filled 2014-06-08: qty 5

## 2014-06-08 MED ORDER — ASPIRIN EC 81 MG PO TBEC
81.0000 mg | DELAYED_RELEASE_TABLET | Freq: Every day | ORAL | Status: DC
  Administered 2014-06-09: 81 mg via ORAL
  Filled 2014-06-08: qty 1

## 2014-06-08 MED ORDER — ALPRAZOLAM 0.5 MG PO TABS
1.0000 mg | ORAL_TABLET | Freq: Every day | ORAL | Status: DC
  Filled 2014-06-08: qty 2

## 2014-06-08 MED ORDER — ALBUTEROL SULFATE HFA 108 (90 BASE) MCG/ACT IN AERS: 1.0000 | INHALATION_SPRAY | Freq: Four times a day (QID) | RESPIRATORY_TRACT | Status: DC | PRN

## 2014-06-08 MED ORDER — ONDANSETRON HCL 4 MG PO TABS
4.0000 mg | ORAL_TABLET | Freq: Four times a day (QID) | ORAL | Status: DC | PRN
Start: 2014-06-08 — End: 2014-06-09

## 2014-06-08 MED ORDER — ALPRAZOLAM ER 1 MG PO TB24: 3.0000 mg | ORAL_TABLET | Freq: Every evening | ORAL | Status: DC

## 2014-06-08 MED ORDER — ROFLUMILAST 500 MCG PO TABS
500.0000 ug | ORAL_TABLET | Freq: Every day | ORAL | Status: DC
  Filled 2014-06-08: qty 1

## 2014-06-08 MED ORDER — PRAVASTATIN SODIUM 40 MG PO TABS
80.0000 mg | ORAL_TABLET | Freq: Every day | ORAL | Status: DC
  Administered 2014-06-08: 80 mg via ORAL
  Filled 2014-06-08: qty 2

## 2014-06-08 MED ORDER — BUDESONIDE-FORMOTEROL FUMARATE 160-4.5 MCG/ACT IN AERO
2.0000 | INHALATION_SPRAY | Freq: Two times a day (BID) | RESPIRATORY_TRACT | Status: DC
  Administered 2014-06-09: 2 via RESPIRATORY_TRACT
  Filled 2014-06-08: qty 6

## 2014-06-08 MED ORDER — METOPROLOL SUCCINATE ER 25 MG PO TB24
12.5000 mg | ORAL_TABLET | Freq: Every day | ORAL | Status: DC
  Filled 2014-06-08: qty 1

## 2014-06-08 MED ORDER — ALPRAZOLAM 0.5 MG PO TABS: 1.0000 mg | ORAL_TABLET | Freq: Every day | ORAL | Status: DC

## 2014-06-08 MED ORDER — SODIUM CHLORIDE 0.9 % IJ SOLN
3.0000 mL | Freq: Two times a day (BID) | INTRAMUSCULAR | Status: DC
  Administered 2014-06-08 – 2014-06-09 (×2): 3 mL via INTRAVENOUS

## 2014-06-08 MED ORDER — ACETAMINOPHEN 500 MG PO TABS
1000.0000 mg | ORAL_TABLET | Freq: Two times a day (BID) | ORAL | Status: DC | PRN
Start: 2014-06-08 — End: 2014-06-09
  Administered 2014-06-09: 1000 mg via ORAL
  Filled 2014-06-08: qty 2

## 2014-06-08 MED ORDER — ALBUTEROL SULFATE (2.5 MG/3ML) 0.083% IN NEBU
2.5000 mg | INHALATION_SOLUTION | Freq: Four times a day (QID) | RESPIRATORY_TRACT | Status: DC | PRN
Start: 2014-06-08 — End: 2014-06-09

## 2014-06-08 MED ORDER — MORPHINE SULFATE 2 MG/ML IJ SOLN: 2.0000 mg | INTRAMUSCULAR | Status: DC | PRN

## 2014-06-08 MED ORDER — PANTOPRAZOLE SODIUM 40 MG PO TBEC
40.0000 mg | DELAYED_RELEASE_TABLET | Freq: Every day | ORAL | Status: DC
  Administered 2014-06-09: 40 mg via ORAL
  Filled 2014-06-08: qty 1

## 2014-06-08 MED ORDER — ONDANSETRON HCL 4 MG/2ML IJ SOLN: 4.0000 mg | Freq: Four times a day (QID) | INTRAMUSCULAR | Status: DC | PRN

## 2014-06-08 MED ORDER — TICAGRELOR 90 MG PO TABS
90.0000 mg | ORAL_TABLET | Freq: Two times a day (BID) | ORAL | Status: DC
  Administered 2014-06-08 – 2014-06-09 (×2): 90 mg via ORAL
  Filled 2014-06-08 (×2): qty 1

## 2014-06-08 MED ORDER — NITROGLYCERIN 0.4 MG SL SUBL
0.4000 mg | SUBLINGUAL_TABLET | SUBLINGUAL | Status: DC | PRN
  Filled 2014-06-08: qty 1

## 2014-06-08 NOTE — ED Notes (Signed)
Transporting patient to new roiom assignment.

## 2014-06-08 NOTE — ED Notes (Addendum)
Pt reports having generalized fatigue, not feeling well and has mid chest tightness and dry cough today. Hx of MI in July. Pt feels near syncopal at triage, has a twitching sensation to abd.

## 2014-06-08 NOTE — H&P (Addendum)
[facility] History and Physical  GLEN KESINGER WFU:[id number] DOB: [birth year] DOA: 06/08/2014  Referring physician: ED physician PCP: Leonides Sake, MD  Specialists:   Chief Complaint: chest pain  HPI: William Mueller is a 64 y.o. male with a history of hypertension, hyperlipidemia, COPD, anxiety, and CKD (stage III), CAD (s/p stent on 12/12/13), who presented with chest pain  Patient reports that his chest pain started in noon. It is located in the substernal area, mild, 3 out of 10 in severity, pressure-like, nonradiating. It is intermittent, happened 3-4 times a day. Each time lasted only for a few minutes. It is not pruritic, it is not aggravated by deep breath. No fever, chills. Patient has mild cough due to COPD, which has not changed. He also reports having weakness, dizzinees, no unilateral weakness or numbness in his extremities.   Patient denies fever, chills, headaches, abdominal pain, diarrhea, constipation, dysuria, urgency, frequency, hematuria, skin rashes, joint pain or leg swelling.  Work up in the ED demonstrates negative chest x-ray, negative troponin, no leukocytosis. Patient is admitted to inpatient for further evaluation and treatment.  Review of Systems: As presented in the history of presenting illness, rest negative.  Where does patient live? At home Can patient participate in ADLs? Yes  Allergy:  Allergies  Allergen Reactions  . Benzodiazepines Anaphylaxis and Shortness Of Breath  . Paroxetine Hcl Shortness Of Breath and Palpitations  . Serotonin Reuptake Inhibitors (Ssris) Shortness Of Breath and Palpitations  . Diazepam Other (See Comments)    Rapid heartrate  . Doxycycline Monohydrate Other (See Comments)    Unknown allergic reaction  . Escitalopram Oxalate Other (See Comments)    serotonin Syndrome  . Lorazepam Other (See Comments)    Unknown allergic reaction  . Tetracyclines & Related Itching and Rash    Past Medical History  Diagnosis Date   . Hypertension   . Anxiety   . COPD (chronic obstructive pulmonary disease)   . High cholesterol   . GERD (gastroesophageal reflux disease)   . Daily headache   . Chronic kidney disease (CKD), stage III (moderate)   . MI (myocardial infarction)     Past Surgical History  Procedure Laterality Date  . No past surgeries    . Cardiac catheterization      MC x 1 stent  . Coronary angioplasty    . Left heart catheterization with coronary angiogram N/A 12/12/2013    Procedure: LEFT HEART CATHETERIZATION WITH CORONARY ANGIOGRAM;  Surgeon: Wellington Hampshire, MD;  Location: [facility];  Service: Cardiovascular;  Laterality: N/A;    Social History:  reports that he quit smoking about 4 years ago. His smoking use included Cigarettes. He has a 144 pack-year smoking history. He has never used smokeless tobacco. He reports that he drinks about 3.6 oz of alcohol per week. He reports that he does not use illicit drugs.  Family History:  Family History  Problem Relation Age of Onset  . Coronary artery disease Brother   . Heart disease Brother 95  . Heart disease Father     MI x 8     Prior to Admission medications   Medication Sig Start Date End Date Taking? Authorizing Provider  acetaminophen (TYLENOL) 500 MG tablet Take 1,000 mg by mouth 2 (two) times daily as needed for headache.   Yes Historical Provider, MD  albuterol (PROVENTIL HFA;VENTOLIN HFA) 108 (90 BASE) MCG/ACT inhaler Inhale 1-2 puffs into the lungs every 6 (six) hours as needed for wheezing or  shortness of breath.   Yes Historical Provider, MD  ALPRAZolam (XANAX XR) 3 MG 24 hr tablet Take 3 mg by mouth every evening. For anxiety control   Yes Historical Provider, MD  ALPRAZolam Duanne Moron) 1 MG tablet Take 1 mg by mouth daily.  11/21/13  Yes Historical Provider, MD  aspirin EC 81 MG EC tablet Take 1 tablet (81 mg total) by mouth daily. 12/14/13  Yes Drucilla Schmidt, MD  budesonide-formoterol [facility]) 160-4.5 MCG/ACT inhaler Inhale 2  puffs into the lungs 2 (two) times daily.   Yes Historical Provider, MD  fenofibrate (TRICOR) 145 MG tablet Take 145 mg by mouth every evening.    Yes Historical Provider, MD  metoprolol succinate (TOPROL-XL) 25 MG 24 hr tablet Take 12.5 mg by mouth daily.    Yes Historical Provider, MD  nitroGLYCERIN (NITROSTAT) 0.4 MG SL tablet Place 0.4 mg under the tongue every 5 (five) minutes as needed for chest pain. Call 911 after 3rd tablet   Yes Historical Provider, MD  omeprazole (PRILOSEC) 40 MG capsule Take 40 mg by mouth daily.   Yes Historical Provider, MD  roflumilast (DALIRESP) 500 MCG TABS tablet Take 500 mcg by mouth daily.   Yes Historical Provider, MD  ticagrelor (BRILINTA) 90 MG TABS tablet Take 1 tablet (90 mg total) by mouth 2 (two) times daily. 02/27/14  Yes Minna Merritts, MD  tiotropium (SPIRIVA) 18 MCG inhalation capsule Place 18 mcg into inhaler and inhale daily.   Yes Historical Provider, MD  Menthol-Methyl Salicylate (MUSCLE RUB EX) Apply 1 application topically daily as needed (pain).  06/03/14   Historical Provider, MD  pravastatin (PRAVACHOL) 80 MG tablet Take 1 tablet (80 mg total) by mouth at bedtime. Patient not taking: Reported on 06/08/2014 12/20/13   Minna Merritts, MD    Physical Exam: Filed Vitals:   06/08/14 1915 06/08/14 1930 06/08/14 1941 06/08/14 2000  BP: 118/62 111/59 111/59 139/78  Pulse: 73 79 76 81  Temp:      TempSrc:      Resp: 15 14 12 17   SpO2: 96% 96% 97% 97%   General: Not in acute distress HEENT:       Eyes: PERRL, EOMI, no scleral icterus       ENT: No discharge from the ears and nose, no pharynx injection, no tonsillar enlargement.        Neck: No JVD, no bruit, no mass felt. Cardiac: S1/S2, RRR, No murmurs, No gallops or rubs Pulm: Good air movement bilaterally. Clear to auscultation bilaterally. No rales, wheezing, rhonchi or rubs. Abd: Soft, nondistended, nontender, no rebound pain, no organomegaly, BS present Ext: No edema bilaterally.  2+DP/PT pulse bilaterally Musculoskeletal: No joint deformities, erythema, or stiffness, ROM full Skin: No rashes.  Neuro: Alert and oriented X3, cranial nerves II-XII grossly intact, muscle strength 5/5 in all extremeties, sensation to light touch intact. Brachial reflex 2+ bilaterally. Knee reflex 1+ bilaterally. Negative Babinski's sign. Normal finger to nose test. Psych: Patient is not psychotic, no suicidal or hemocidal ideation.  Labs on Admission:  Basic Metabolic Panel:  Recent Labs Lab 06/08/14 1710  NA 137  K 3.9  CL 103  CO2 25  GLUCOSE 108*  BUN 15  CREATININE 1.87*  CALCIUM 9.8   Liver Function Tests: No results for input(s): AST, ALT, ALKPHOS, BILITOT, PROT, ALBUMIN in the last 168 hours. No results for input(s): LIPASE, AMYLASE in the last 168 hours. No results for input(s): AMMONIA in the last 168 hours. CBC:  Recent  Labs Lab 06/08/14 1710  WBC 6.1  HGB 13.9  HCT 39.9  MCV 86.9  PLT 308   Cardiac Enzymes: No results for input(s): CKTOTAL, CKMB, CKMBINDEX, TROPONINI in the last 168 hours.  BNP (last 3 results)  Recent Labs  02/26/14 1550  PROBNP 131.9*   CBG: No results for input(s): GLUCAP in the last 168 hours.  Radiological Exams on Admission: Dg Chest 2 View  06/08/2014   CLINICAL DATA:  Left chest pain and tightness.  EXAM: CHEST  2 VIEW  COMPARISON:  May 04, 2014, June 17, 2010  FINDINGS: The heart size and mediastinal contours are within normal limits. There is no focal infiltrate, pulmonary edema, or pleural effusion. Calcified granuloma is identified in the medial left upper lung unchanged compared to prior exam. The visualized skeletal structures are stable.  IMPRESSION: No active cardiopulmonary disease.   Electronically Signed   By: Abelardo Diesel M.D.   On: 06/08/2014 18:21    EKG: Independently reviewed. Nonspecific T-wave changes in precordial leads.  Assessment/Plan Principal Problem:   Chest pain Active Problems:   HTN  (hypertension)   Hyperlipidemia   COPD (chronic obstructive pulmonary disease)   NSTEMI (non-ST elevated myocardial infarction)   CKD (chronic kidney disease) stage 3, GFR 30-59 ml/min   Lung nodule < 6cm on CT   GERD (gastroesophageal reflux disease)  Chest pain: Patient has atypical chest pain. May be related to his anxiety. However given his significant history of coronary artery disease, and recent stent placement, we'll admit patient for chest pain rule out  -will admit to Tele bed for observation as chest pain rule out.  - cycle CE q6 x3 and repeat her EKG in the am  - Nitroglycerin, Morphine, aspirin, Pravachol, Brinlinta, metoprolol - Consider cardiology consult if test positive for CEs - repeat 2d echo  Hypertension:  -metoprolol  Hyperlipidemia:   - On Pravachol  COPD:    stable, no signs of acute exacerbation -continue home inhalers.  Anxiety:  - alprazolam home dosing   CKD (stage III): His baseline creatinine is 1.7-1.9.   creatinine 1.7 on admission, which is at a baseline -Follow-up BMP  GERD  - start Protonix    DVT ppx: SQ Heparin    Code Status: Full code Family Communication:  Yes, patient's  wife     at bed side Disposition Plan: Admit to inpatient   Date of Service 06/08/2014    Ivor Costa [facility] Pager [phone]  If 7PM-7AM, please contact night-coverage [email or web address] Password [facility] 06/08/2014, 8:19 PM

## 2014-06-08 NOTE — ED Notes (Signed)
Dr Zavitz at bedside  

## 2014-06-08 NOTE — ED Provider Notes (Signed)
CSN: [id number]     Arrival date & time 06/08/14  1703 History   First MD Initiated Contact with Patient 06/08/14 1802     Chief Complaint  Patient presents with  . Chest Pain     (Consider location/radiation/quality/duration/timing/severity/associated sxs/prior Treatment) HPI Comments: 64 year old male with history of high blood pressure, renal failure, lipids, COPD, CAD, non-ST elevated myocardial infarction, stent placement on Brelenta presents with intermittent chest tightness since this morning. Patient felt anxiety symptoms as morning and that episode of chest tightness and blood pressure was elevated, EMS assessed patient remained at home. Patient continued to have intermittent chest tightness lasting 1-2 minutes of time nonradiating, anterior, similar to previous but not as severe. Patient denies diaphoresis or exertional chest pain. Mild nausea and lightheadedness episode that resolved. Currently no symptoms. Patient heart doctor Dr. Jaynie Collins. Patient is negative for [facility] criteria for evaluation of low risk PE. Patient denies blood clot history, active cancer, recent major trauma or surgery, unilateral leg swelling/ pain, recent long travel, hemoptysis or oral contraceptives.   Patient is a 64 y.o. male presenting with chest pain. The history is provided by the patient.  Chest Pain Associated symptoms: cough   Associated symptoms: no abdominal pain, no back pain, no fever, no headache, no shortness of breath and not vomiting     Past Medical History  Diagnosis Date  . Hypertension   . Anxiety   . COPD (chronic obstructive pulmonary disease)   . High cholesterol   . GERD (gastroesophageal reflux disease)   . Daily headache   . Chronic kidney disease (CKD), stage III (moderate)   . MI (myocardial infarction)    Past Surgical History  Procedure Laterality Date  . No past surgeries    . Cardiac catheterization      MC x 1 stent  . Coronary angioplasty    . Left heart  catheterization with coronary angiogram N/A 12/12/2013    Procedure: LEFT HEART CATHETERIZATION WITH CORONARY ANGIOGRAM;  Surgeon: Wellington Hampshire, MD;  Location: [place] CATH LAB;  Service: Cardiovascular;  Laterality: N/A;   Family History  Problem Relation Age of Onset  . Coronary artery disease Brother   . Heart disease Brother 100  . Heart disease Father     MI x 8   History  Substance Use Topics  . Smoking status: Former Smoker -- 3.00 packs/day for 48 years    Types: Cigarettes    Quit date: 06/07/2010  . Smokeless tobacco: Never Used     Comment: Using electronic cigarette  . Alcohol Use: 3.6 oz/week    6 Cans of beer per week    Review of Systems  Constitutional: Negative for fever and chills.  HENT: Negative for congestion.   Eyes: Negative for visual disturbance.  Respiratory: Positive for cough and chest tightness. Negative for shortness of breath.   Cardiovascular: Positive for chest pain.  Gastrointestinal: Negative for vomiting and abdominal pain.  Genitourinary: Negative for dysuria and flank pain.  Musculoskeletal: Negative for back pain, neck pain and neck stiffness.  Skin: Negative for rash.  Neurological: Positive for light-headedness. Negative for headaches.      Allergies  Benzodiazepines; Paroxetine hcl; Serotonin reuptake inhibitors (ssris); Diazepam; Doxycycline monohydrate; Escitalopram oxalate; Lorazepam; and Tetracyclines & related  Home Medications   Prior to Admission medications   Medication Sig Start Date End Date Taking? Authorizing Provider  acetaminophen (TYLENOL) 500 MG tablet Take 1,000 mg by mouth 2 (two) times daily as needed for headache.  Yes Historical Provider, MD  albuterol (PROVENTIL HFA;VENTOLIN HFA) 108 (90 BASE) MCG/ACT inhaler Inhale 1-2 puffs into the lungs every 6 (six) hours as needed for wheezing or shortness of breath.   Yes Historical Provider, MD  ALPRAZolam (XANAX XR) 3 MG 24 hr tablet Take 3 mg by mouth every evening.  For anxiety control   Yes Historical Provider, MD  ALPRAZolam Duanne Moron) 1 MG tablet Take 1 mg by mouth daily.  11/21/13  Yes Historical Provider, MD  aspirin EC 81 MG EC tablet Take 1 tablet (81 mg total) by mouth daily. 12/14/13  Yes Drucilla Schmidt, MD  budesonide-formoterol [facility]) 160-4.5 MCG/ACT inhaler Inhale 2 puffs into the lungs 2 (two) times daily.   Yes Historical Provider, MD  fenofibrate (TRICOR) 145 MG tablet Take 145 mg by mouth every evening.    Yes Historical Provider, MD  metoprolol succinate (TOPROL-XL) 25 MG 24 hr tablet Take 12.5 mg by mouth daily.    Yes Historical Provider, MD  nitroGLYCERIN (NITROSTAT) 0.4 MG SL tablet Place 0.4 mg under the tongue every 5 (five) minutes as needed for chest pain. Call 911 after 3rd tablet   Yes Historical Provider, MD  omeprazole (PRILOSEC) 40 MG capsule Take 40 mg by mouth daily.   Yes Historical Provider, MD  roflumilast (DALIRESP) 500 MCG TABS tablet Take 500 mcg by mouth daily.   Yes Historical Provider, MD  ticagrelor (BRILINTA) 90 MG TABS tablet Take 1 tablet (90 mg total) by mouth 2 (two) times daily. 02/27/14  Yes Minna Merritts, MD  tiotropium (SPIRIVA) 18 MCG inhalation capsule Place 18 mcg into inhaler and inhale daily.   Yes Historical Provider, MD  Menthol-Methyl Salicylate (MUSCLE RUB EX) Apply 1 application topically daily as needed (pain).  06/03/14   Historical Provider, MD  pravastatin (PRAVACHOL) 80 MG tablet Take 1 tablet (80 mg total) by mouth at bedtime. Patient not taking: Reported on 06/08/2014 12/20/13   Minna Merritts, MD   BP 118/62 mmHg  Pulse 73  Temp(Src) 97.9 F (36.6 C) (Oral)  Resp 15  SpO2 96% Physical Exam  Constitutional: He is oriented to person, place, and time. He appears well-developed and well-nourished.  HENT:  Head: Normocephalic and atraumatic.  Eyes: Conjunctivae are normal. Right eye exhibits no discharge. Left eye exhibits no discharge.  Neck: Normal range of motion. Neck supple. No  tracheal deviation present.  Cardiovascular: Normal rate, regular rhythm and intact distal pulses.   No murmur heard. Pulmonary/Chest: Effort normal and breath sounds normal.  Abdominal: Soft. He exhibits no distension. There is no tenderness. There is no guarding.  Musculoskeletal: He exhibits no edema.  Neurological: He is alert and oriented to person, place, and time.  Skin: Skin is warm. No rash noted.  Psychiatric: He has a normal mood and affect.  Nursing note and vitals reviewed.   ED Course  Procedures (including critical care time) Labs Review Labs Reviewed  BASIC METABOLIC PANEL - Abnormal; Notable for the following:    Glucose, Bld 108 (*)    Creatinine, Ser 1.87 (*)    GFR calc non Af Amer 36 (*)    GFR calc Af Amer 42 (*)    All other components within normal limits  CBC  BRAIN NATRIURETIC PEPTIDE  I-STAT TROPOININ, ED  Randolm Idol, ED    Imaging Review Dg Chest 2 View  06/08/2014   CLINICAL DATA:  Left chest pain and tightness.  EXAM: CHEST  2 VIEW  COMPARISON:  May 04, 2014,  June 17, 2010  FINDINGS: The heart size and mediastinal contours are within normal limits. There is no focal infiltrate, pulmonary edema, or pleural effusion. Calcified granuloma is identified in the medial left upper lung unchanged compared to prior exam. The visualized skeletal structures are stable.  IMPRESSION: No active cardiopulmonary disease.   Electronically Signed   By: Abelardo Diesel M.D.   On: 06/08/2014 18:21     EKG Interpretation   Date/Time:  Saturday June 08 2014 17:07:00 EST Ventricular Rate:  105 PR Interval:  136 QRS Duration: 86 QT Interval:  330 QTC Calculation: 436 R Axis:   76 Text Interpretation:  Sinus tachycardia Otherwise normal ECG no  significant changes Confirmed by Aaron Boeh  MD, Markiya Keefe ([id number]) on 06/08/2014  6:02:06 PM      MDM   Final diagnoses:  Acute chest pain  CRF  Patient with known coronary artery disease and recent heart  attack/stent placement presents with intermittent chest tightness. EKG reviewed no acute findings, chest x-ray reviewed no acute findings, first troponin negative, plan for delta troponin. Cardiology paged to discuss observation for serial troponins/further workup versus close outpatient follow-up. Pt had antiplatelet meds today Patient chest pain-free in ER. Patient has atypical chest pain however with recent stent placement and known disease discussed with cardiologist on call Dr. Mayer Camel who agreed with observation for serial troponins with intermittent chest pain. [facility] hospitalist page  The patients results and plan were reviewed and discussed.   Any x-rays performed were personally reviewed by myself.   Differential diagnosis were considered with the presenting HPI.  Medications - No data to display  Filed Vitals:   06/08/14 1800 06/08/14 1858 06/08/14 1900 06/08/14 1915  BP: 135/85 119/68 121/98 118/62  Pulse: 83 79 82 73  Temp:      TempSrc:      Resp: 15 13 18 15   SpO2: 100% 98% 95% 96%    Final diagnoses:  Acute chest pain    Admission/ observation were discussed with the admitting physician, patient and/or family and they are comfortable with the plan.     Mariea Clonts, MD 06/08/14 [phone]

## 2014-06-09 ENCOUNTER — Inpatient Hospital Stay (HOSPITAL_COMMUNITY): Payer: Medicare HMO

## 2014-06-09 ENCOUNTER — Inpatient Hospital Stay (HOSPITAL_COMMUNITY): Payer: Commercial Managed Care - HMO

## 2014-06-09 DIAGNOSIS — R079 Chest pain, unspecified: Secondary | ICD-10-CM

## 2014-06-09 DIAGNOSIS — R072 Precordial pain: Secondary | ICD-10-CM

## 2014-06-09 LAB — CBC
HEMATOCRIT: 38.9 % — AB (ref 39.0–52.0)
HEMOGLOBIN: 13.3 g/dL (ref 13.0–17.0)
MCH: 29.8 pg (ref 26.0–34.0)
MCHC: 34.2 g/dL (ref 30.0–36.0)
MCV: 87.2 fL (ref 78.0–100.0)
Platelets: 265 10*3/uL (ref 150–400)
RBC: 4.46 MIL/uL (ref 4.22–5.81)
RDW: 12.4 % (ref 11.5–15.5)
WBC: 7.1 10*3/uL (ref 4.0–10.5)

## 2014-06-09 LAB — TROPONIN I
Troponin I: <0.03 ng/mL (ref <0.031)
Troponin I: <0.03 ng/mL (ref <0.031)

## 2014-06-09 LAB — BASIC METABOLIC PANEL
ANION GAP: 4 — AB (ref 5–15)
BUN: 16 mg/dL (ref 6–23)
CALCIUM: 9.2 mg/dL (ref 8.4–10.5)
CO2: 28 mmol/L (ref 19–32)
CREATININE: 1.84 mg/dL — AB (ref 0.50–1.35)
Chloride: 105 mEq/L (ref 96–112)
GFR, EST AFRICAN AMERICAN: 43 mL/min — AB (ref >90)
GFR, EST NON AFRICAN AMERICAN: 37 mL/min — AB (ref >90)
Glucose, Bld: 92 mg/dL (ref 70–99)
POTASSIUM: 3.6 mmol/L (ref 3.5–5.1)
SODIUM: 137 mmol/L (ref 135–145)

## 2014-06-09 MED ORDER — REGADENOSON 0.4 MG/5ML IV SOLN
INTRAVENOUS | Status: AC
  Filled 2014-06-09: qty 5

## 2014-06-09 MED ORDER — ACETAMINOPHEN 325 MG PO TABS
650.0000 mg | ORAL_TABLET | ORAL | Status: DC | PRN
  Administered 2014-06-09: 650 mg via ORAL
  Filled 2014-06-09: qty 2

## 2014-06-09 MED ORDER — TECHNETIUM TC 99M SESTAMIBI GENERIC - CARDIOLITE
30.0000 | Freq: Once | INTRAVENOUS | Status: AC | PRN
  Administered 2014-06-09: 30 via INTRAVENOUS

## 2014-06-09 MED ORDER — REGADENOSON 0.4 MG/5ML IV SOLN
0.4000 mg | Freq: Once | INTRAVENOUS | Status: AC
  Administered 2014-06-09: 0.4 mg via INTRAVENOUS
  Filled 2014-06-09: qty 5

## 2014-06-09 MED ORDER — TECHNETIUM TC 99M SESTAMIBI GENERIC - CARDIOLITE
10.0000 | Freq: Once | INTRAVENOUS | Status: AC | PRN
  Administered 2014-06-09: 10 via INTRAVENOUS

## 2014-06-09 MED ORDER — REGADENOSON 0.4 MG/5ML IV SOLN
0.4000 mg | Freq: Once | INTRAVENOUS | Status: AC
  Filled 2014-06-09: qty 5

## 2014-06-09 NOTE — Consult Note (Signed)
CARDIOLOGY CONSULT NOTE       Patient ID: William Mueller MRN: [id number] DOB/AGE: May 05, 1951 64 y.o.  Admit date: 06/08/2014 Referring Physician:  Elgergawy Primary Physician: Leonides Sake, MD Primary Cardiologist:  Freida Busman Reason for Consultation:  Chest Pain  Principal Problem:   Chest pain Active Problems:   HTN (hypertension)   Hyperlipidemia   COPD (chronic obstructive pulmonary disease)   NSTEMI (non-ST elevated myocardial infarction)   CKD (chronic kidney disease) stage 3, GFR 30-59 ml/min   Lung nodule < 6cm on CT   GERD (gastroesophageal reflux disease)   HPI:   William Mueller is a very pleasant 64 year old gentleman with a long history of smoking, COPD, chronic renal insufficiency, erectile dysfunction, who presented 12/11/2013 to [place] with chest pain. He was taken to the cardiac catheterization lab where he was found to have an occluded mid RCA. Stent was placed Seen in ER 9/22 chest pain d/c  Seen 11/28 in ER chest pain and left AMA   Echocardiogram July 2015 showed ejection fraction 55-60% Otherwise he feels well with no complaints He feels that his breathing has significantly improved since he stopped smoking  Cardiac catheterization 12/12/2013 Left Main: Normal Left Anterior Descending (LAD): Normal in size and mildly calcified. There is diffuse 20% disease throughout the midsegment. The rest of the vessel has minor irregularities. 1st diagonal (D1): Normal in size with 50% ostial stenosis and 50% proximal stenosis. 2nd diagonal (D2): Large in size with 30% proximal stenosis. 3rd diagonal (D3): Very small in size. Circumflex (LCx): Normal in size and nondominant. The vessel is moderately calcified with diffuse 20% disease in the midsegment. 1st obtuse marginal: Very small in size. 2nd obtuse marginal: Normal in size with no significant disease. 3rd obtuse marginal: Medium in size with no significant disease.  AV groove  continuation segment: Small in size and subtotally occluded proximally.  Readmitted with severe SSCP  Not exertional lasted hours not pleuritic Resolved now  R/O and no acute ECG changes  Cr normally runs about 1.8-2.1  Has missed a few doses of Brillinta   ROS All other systems reviewed and negative except as noted above  Past Medical History  Diagnosis Date  . Hypertension   . Anxiety   . COPD (chronic obstructive pulmonary disease)   . High cholesterol   . GERD (gastroesophageal reflux disease)   . Daily headache   . Chronic kidney disease (CKD), stage III (moderate)   . MI (myocardial infarction)     Family History  Problem Relation Age of Onset  . Coronary artery disease Brother   . Heart disease Brother 29  . Heart disease Father     MI x 8    History   Social History  . Marital Status: Married    Spouse Name: N/A    Number of Children: N/A  . Years of Education: N/A   Occupational History  . Not on file.   Social History Main Topics  . Smoking status: Former Smoker -- 3.00 packs/day for 48 years    Types: Cigarettes    Quit date: 06/07/2010  . Smokeless tobacco: Never Used     Comment: Using electronic cigarette  . Alcohol Use: 3.6 oz/week    6 Cans of beer per week  . Drug Use: No  . Sexual Activity: Not Currently   Other Topics Concern  . Not on file   Social History Narrative    Past Surgical History  Procedure Laterality Date  . No  past surgeries    . Cardiac catheterization      MC x 1 stent  . Coronary angioplasty    . Left heart catheterization with coronary angiogram N/A 12/12/2013    Procedure: LEFT HEART CATHETERIZATION WITH CORONARY ANGIOGRAM;  Surgeon: Wellington Hampshire, MD;  Location: [place] CATH LAB;  Service: Cardiovascular;  Laterality: N/A;     . ALPRAZolam  1 mg Oral Daily  . ALPRAZolam  3 mg Oral QHS  . aspirin EC  81 mg Oral Daily  . budesonide-formoterol  2 puff Inhalation BID  . fenofibrate  160 mg Oral Daily  . metoprolol  succinate  12.5 mg Oral Daily  . pantoprazole  40 mg Oral Daily  . pravastatin  80 mg Oral QHS  . regadenoson  0.4 mg Intravenous Once  . roflumilast  500 mcg Oral Daily  . sodium chloride  3 mL Intravenous Q12H  . ticagrelor  90 mg Oral BID  . tiotropium  18 mcg Inhalation Daily      Physical Exam: Blood pressure 130/75, pulse 77, temperature 98.2 F (36.8 C), temperature source Oral, resp. rate 18, height 5\' 9"  (1.753 m), weight 81.965 kg (180 lb 11.2 oz), SpO2 99 %.    Affect appropriate Healthy:  appears stated age 59: normal Neck supple with no adenopathy JVP normal no bruits no thyromegaly Lungs clear with no wheezing and good diaphragmatic motion Heart:  S1/S2 no murmur, no rub, gallop or click PMI normal Abdomen: benighn, BS positve, no tenderness, no AAA no bruit.  No HSM or HJR Distal pulses intact with no bruits No edema Neuro non-focal Skin warm and dry No muscular weakness   Labs:   Lab Results  Component Value Date   WBC 7.1 06/09/2014   HGB 13.3 06/09/2014   HCT 38.9* 06/09/2014   MCV 87.2 06/09/2014   PLT 265 06/09/2014    Recent Labs Lab 06/08/14 1710  NA 137  K 3.9  CL 103  CO2 25  BUN 15  CREATININE 1.87*  CALCIUM 9.8  GLUCOSE 108*   Lab Results  Component Value Date   CKTOTAL 137 05/04/2014   TROPONINI <0.03 06/09/2014    Lab Results  Component Value Date   CHOL 201* 12/12/2013   Lab Results  Component Value Date   HDL 52 12/12/2013   Lab Results  Component Value Date   LDLCALC 123* 12/12/2013   Lab Results  Component Value Date   TRIG 131 12/12/2013   Lab Results  Component Value Date   CHOLHDL 3.9 12/12/2013   No results found for: LDLDIRECT    Radiology: Dg Chest 2 View  06/08/2014   CLINICAL DATA:  Left chest pain and tightness.  EXAM: CHEST  2 VIEW  COMPARISON:  May 04, 2014, June 17, 2010  FINDINGS: The heart size and mediastinal contours are within normal limits. There is no focal infiltrate,  pulmonary edema, or pleural effusion. Calcified granuloma is identified in the medial left upper lung unchanged compared to prior exam. The visualized skeletal structures are stable.  IMPRESSION: No active cardiopulmonary disease.   Electronically Signed   By: Abelardo Diesel M.D.   On: 06/08/2014 18:21    EKG:  SR rate 78 normal    ASSESSMENT AND PLAN:  Chest pain:  History of DES to RCA 7/15 some missed brillinta doses  ER visits for chest pain July and November.  Like to avoid cath with CRF Lexiscan myovue ordered will try to do today  Continue  asa / brilinta and beta blocker  Chol:  Continue pravastatin  Anxiety PRN xanax    Signed: Jenkins Rouge 06/09/2014, 9:45 AM

## 2014-06-09 NOTE — Progress Notes (Signed)
Lexiscan stress test completed. Mild ST depression after lexiscan injection in inferior leads and also V4-V6. Final perfusion image pending by [facility].   Hilbert Corrigan PA Pager: [phone]

## 2014-06-09 NOTE — Discharge Summary (Signed)
William Mueller, 64 y.o., DOB Oct 11, 1950, MRN [id number]. Admission date: 06/08/2014 Discharge Date 06/09/2014 Primary MD Leonides Sake, MD Admitting Physician Ivor Costa, MD  Admission Diagnosis  Acute chest pain [R07.9] Chest pain [R07.9]  Discharge Diagnosis   Principal Problem:   Chest pain Active Problems:   HTN (hypertension)   Hyperlipidemia   COPD (chronic obstructive pulmonary disease)   NSTEMI (non-ST elevated myocardial infarction)   CKD (chronic kidney disease) stage 3, GFR 30-59 ml/min   Lung nodule < 6cm on CT   GERD (gastroesophageal reflux disease)     Past Medical History  Diagnosis Date  . Hypertension   . Anxiety   . COPD (chronic obstructive pulmonary disease)   . High cholesterol   . GERD (gastroesophageal reflux disease)   . Daily headache   . Chronic kidney disease (CKD), stage III (moderate)   . MI (myocardial infarction)     Past Surgical History  Procedure Laterality Date  . No past surgeries    . Cardiac catheterization      MC x 1 stent  . Coronary angioplasty    . Left heart catheterization with coronary angiogram N/A 12/12/2013    Procedure: LEFT HEART CATHETERIZATION WITH CORONARY ANGIOGRAM;  Surgeon: Wellington Hampshire, MD;  Location: [facility];  Service: Cardiovascular;  Laterality: N/A;     Hospital Course See H&P, Labs, Consult and Test reports for all details in brief, patient was admitted for   Principal Problem:   Chest pain Active Problems:   HTN (hypertension)   Hyperlipidemia   COPD (chronic obstructive pulmonary disease)   NSTEMI (non-ST elevated myocardial infarction)   CKD (chronic kidney disease) stage 3, GFR 30-59 ml/min   Lung nodule < 6cm on CT   GERD (gastroesophageal reflux disease)  William Mueller is a  pleasant 64 year old gentleman with a long history of smoking, COPD, chronic renal insufficiency, erectile dysfunction, who presented 65/12/2013 to [facility] with chest pain. He was taken to the cardiac catheterization  lab where he was found to have an occluded mid RCA. Stent was placed .Echocardiogram July 2015 showed ejection fraction 55-60%, presents overnight with complaints of chest pain, reports it's intermittent, nonpleuritic, mild, And 60-4 times per day, EKG was nonacute, patient cardiac enzymes were cycled, he had negative cardiac enzymes 3, cardiology were consulted, given his history and risk factors, patient had a nuclear stress test done which did not show any reversible ischemia or infarction, left ventricular ejection fraction 53%, normal left ventricle wall motion, low risk stress test finding.  Chest pain - Negative cardiac enzymes 3 - Nuclear stress test done, with no evidence of ischemia or infarction, low risk stress test.  Coronary artery disease with stent placement in July 2015 - Continue with bilateral intact, statin, beta blocker, no ACE inhibitor or ARB given his renal failure  Hypertension:  -metoprolol  Hyperlipidemia:  - On Pravachol  COPD:  stable, no signs of acute exacerbation -continue home inhalers.  Anxiety: - alprazolam home dosing   CKD (stage III): His baseline creatinine is 1.7-1.9.  creatinine 1.7 on admission, which is at a baseline   Consults   Cardiology  Procedures - Nuclear stress test, low risk stress test, no evidence of ischemia or infarction, no wall motion abnormality, EF 53%.  Significant Tests:  See full reports for all details    Dg Chest 2 View  06/08/2014   CLINICAL DATA:  Left chest pain and tightness.  EXAM: CHEST  2 VIEW  COMPARISON:  May 04, 2014, June 17, 2010  FINDINGS: The heart size and mediastinal contours are within normal limits. There is no focal infiltrate, pulmonary edema, or pleural effusion. Calcified granuloma is identified in the medial left upper lung unchanged compared to prior exam. The visualized skeletal structures are stable.  IMPRESSION: No active cardiopulmonary disease.   Electronically Signed   By:  Abelardo Diesel M.D.   On: 06/08/2014 18:21   Nm Myocar Multi W/spect W/wall Motion / Ef  06/09/2014   CLINICAL DATA:  Chest pain.  EXAM: MYOCARDIAL IMAGING WITH SPECT (REST AND PHARMACOLOGIC-STRESS)  GATED LEFT VENTRICULAR WALL MOTION STUDY  LEFT VENTRICULAR EJECTION FRACTION  TECHNIQUE: Standard myocardial SPECT imaging was performed after resting intravenous injection of 10 mCi Tc-75m sestamibi. Subsequently, intravenous infusion of Lexiscan was performed under the supervision of the Cardiology staff. At peak effect of the drug, 30 mCi Tc-21m sestamibi was injected intravenously and standard myocardial SPECT imaging was performed. Quantitative gated imaging was also performed to evaluate left ventricular wall motion, and estimate left ventricular ejection fraction.  COMPARISON:  None.  FINDINGS: Perfusion: No decreased activity in the left ventricle on stress imaging to suggest reversible ischemia or infarction. Diaphragmatic attenuation of the inferior wall is noted.  Wall Motion: Normal left ventricular wall motion. No left ventricular dilation.  Left Ventricular Ejection Fraction: 53 %  End diastolic volume 53 ml  End systolic volume 25 ml  IMPRESSION: 1. No reversible ischemia or infarction.  2. Normal left ventricular wall motion.  3. Left ventricular ejection fraction 53%  4. Low-risk stress test findings*.  *2012 Appropriate Use Criteria for Coronary Revascularization Focused Update: J Am Coll Cardiol. 9323;55(7):322-025. http://content.airportbarriers.com.aspx?articleid=1201161   Electronically Signed   By: Kalman Jewels M.D.   On: 06/09/2014 12:56     Today   Subjective:   William Mueller today has no headache,no chest abdominal pain,no new weakness tingling or numbness, feels much better wants to go home today.   Objective:   Blood pressure 140/78, pulse 93, temperature 98.2 F (36.8 C), temperature source Oral, resp. rate 18, height 5\' 9"  (1.753 m), weight 81.965 kg (180 lb 11.2 oz),  SpO2 99 %.  Intake/Output Summary (Last 24 hours) at 06/09/14 1336 Last data filed at 06/09/14 1243  Gross per 24 hour  Intake    240 ml  Output      0 ml  Net    240 ml    Exam Awake Alert, Oriented *3, No new F.N deficits, Normal affect [place].AT,PERRAL Supple Neck,No JVD, No cervical lymphadenopathy appriciated.  Symmetrical Chest wall movement, Good air movement bilaterally, CTAB RRR,No Gallops,Rubs or new Murmurs, No Parasternal Heave +ve B.Sounds, Abd Soft, Non tender, No organomegaly appriciated, No rebound -guarding or rigidity. No Cyanosis, Clubbing or edema, No new Rash or bruise  Data Review     CBC w Diff: Lab Results  Component Value Date   WBC 7.1 06/09/2014   HGB 13.3 06/09/2014   HCT 38.9* 06/09/2014   PLT 265 06/09/2014   LYMPHOPCT 20 12/11/2013   MONOPCT 8 12/11/2013   EOSPCT 11* 12/11/2013   BASOPCT 0 12/11/2013   CMP: Lab Results  Component Value Date   NA 137 06/09/2014   K 3.6 06/09/2014   CL 105 06/09/2014   CO2 28 06/09/2014   BUN 16 06/09/2014   CREATININE 1.84* 06/09/2014   PROT 7.3 04/14/2014   ALBUMIN 4.1 04/14/2014   BILITOT 0.4 04/14/2014   ALKPHOS 64 04/14/2014   AST 29 04/14/2014  ALT 31 04/14/2014  .  Micro Results No results found for this or any previous visit (from the past 240 hour(s)).   Discharge Instructions      Follow-up Information    Follow up with [facility] L, MD. Schedule an appointment as soon as possible for a visit in 1 week.   Specialty:  Family Medicine   Contact information:   Dr. Daiva Eves [address] [place] [address] [phone]       Follow up with Ida Rogue, MD. Schedule an appointment as soon as possible for a visit in 3 weeks.   Specialty:  Cardiology   Contact information:   [place] [address] [phone]       Discharge Medications     Medication List    TAKE these medications        acetaminophen 500 MG tablet  Commonly  known as:  TYLENOL  Take 1,000 mg by mouth 2 (two) times daily as needed for headache.     albuterol 108 (90 BASE) MCG/ACT inhaler  Commonly known as:  PROVENTIL HFA;VENTOLIN HFA  Inhale 1-2 puffs into the lungs every 6 (six) hours as needed for wheezing or shortness of breath.     ALPRAZolam 3 MG 24 hr tablet  Commonly known as:  XANAX XR  Take 3 mg by mouth every evening. For anxiety control     ALPRAZolam 1 MG tablet  Commonly known as:  XANAX  Take 1 mg by mouth daily.     aspirin 81 MG EC tablet  Take 1 tablet (81 mg total) by mouth daily.     budesonide-formoterol 160-4.5 MCG/ACT inhaler  Commonly known as:  SYMBICORT  Inhale 2 puffs into the lungs 2 (two) times daily.     fenofibrate 145 MG tablet  Commonly known as:  TRICOR  Take 145 mg by mouth every evening.     metoprolol succinate 25 MG 24 hr tablet  Commonly known as:  TOPROL-XL  Take 12.5 mg by mouth daily.     MUSCLE RUB EX  Apply 1 application topically daily as needed (pain).     nitroGLYCERIN 0.4 MG SL tablet  Commonly known as:  NITROSTAT  Place 0.4 mg under the tongue every 5 (five) minutes as needed for chest pain. Call 911 after 3rd tablet     omeprazole 40 MG capsule  Commonly known as:  PRILOSEC  Take 40 mg by mouth daily.     pravastatin 80 MG tablet  Commonly known as:  PRAVACHOL  Take 1 tablet (80 mg total) by mouth at bedtime.     roflumilast 500 MCG Tabs tablet  Commonly known as:  DALIRESP  Take 500 mcg by mouth daily.     ticagrelor 90 MG Tabs tablet  Commonly known as:  BRILINTA  Take 1 tablet (90 mg total) by mouth 2 (two) times daily.     tiotropium 18 MCG inhalation capsule  Commonly known as:  SPIRIVA  Place 18 mcg into inhaler and inhale daily.         Total Time in preparing paper work, data evaluation and todays exam - 35 minutes  ELGERGAWY, DAWOOD M.D on 06/09/2014 at [facility]  [phone]

## 2014-06-09 NOTE — Discharge Instructions (Signed)
Follow with Primary MD HAMRICK,MAURA L, MD in 7 days   Get CBC, CMP, 2 view Chest X ray checked  by Primary MD next visit.    Activity: As tolerated with Full fall precautions use walker/cane & assistance as needed   Disposition Home    Diet: Heart Healthy  , with feeding assistance and aspiration precautions as needed.  For Heart failure patients - Check your Weight same time everyday, if you gain over 2 pounds, or you develop in leg swelling, experience more shortness of breath or chest pain, call your Primary MD immediately. Follow Cardiac Low Salt Diet and 1.8 lit/day fluid restriction.   On your next visit with your primary care physician please Get Medicines reviewed and adjusted.   Please request your Prim.MD to go over all Hospital Tests and Procedure/Radiological results at the follow up, please get all Hospital records sent to your Prim MD by signing hospital release before you go home.   If you experience worsening of your admission symptoms, develop shortness of breath, life threatening emergency, suicidal or homicidal thoughts you must seek medical attention immediately by calling 911 or calling your MD immediately  if symptoms less severe.  You Must read complete instructions/literature along with all the possible adverse reactions/side effects for all the Medicines you take and that have been prescribed to you. Take any new Medicines after you have completely understood and accpet all the possible adverse reactions/side effects.   Do not drive, operating heavy machinery, perform activities at heights, swimming or participation in water activities or provide baby sitting services if your were admitted for syncope or siezures until you have seen by Primary MD or a Neurologist and advised to do so again.  Do not drive when taking Pain medications.    Do not take more than prescribed Pain, Sleep and Anxiety Medications  Special Instructions: If you have smoked or chewed  Tobacco  in the last 2 yrs please stop smoking, stop any regular Alcohol  and or any Recreational drug use.  Wear Seat belts while driving.   Please note  You were cared for by a hospitalist during your hospital stay. If you have any questions about your discharge medications or the care you received while you were in the hospital after you are discharged, you can call the unit and asked to speak with the hospitalist on call if the hospitalist that took care of you is not available. Once you are discharged, your primary care physician will handle any further medical issues. Please note that NO REFILLS for any discharge medications will be authorized once you are discharged, as it is imperative that you return to your primary care physician (or establish a relationship with a primary care physician if you do not have one) for your aftercare needs so that they can reassess your need for medications and monitor your lab values.   Cardiac Diet A cardiac diet can help stop heart disease or a stroke from happening. It involves eating less unhealthy fats and eating more healthy fats.  FOODS TO AVOID OR LIMIT  Limit saturated fats. This type of fat is found in oils and dairy products, such as:  Coconut oil.  Palm oil.  Cocoa butter.  Butter.  Avoid trans-fat or hydrogenated oils. These are found in fried or pre-made baked goods, such as:  Margarine.  Pre-made cookies, cakes, and crackers.  Limit processed meats (hot dogs, deli meats, sausage) to 3 ounces a week.  Limit high-fat meats (marbled  meats, fried chicken, or chicken with skin) to 3 ounces a week.  Limit salt (sodium) to 1500 milligrams a day.   Limit sweets and drinks with added sugar to no more than 5 servings a week. One serving is:  1 tablespoon of sugar.  1 tablespoon of jelly or jam.   cup sorbet.  1 cup lemonade.   cup regular soda. EAT MORE OF THE FOLLOWING FOODS Fruit  Eat 4to 5 servings a day. One  serving of fruit is:  1 medium whole fruit.   cup dried fruit.   cup of fresh, frozen, or canned fruit.   cup 100% fruit juice. Vegetables  Eat 4 to 5 servings a day. One serving is:  1 cup raw leafy vegetables.   cup raw or cooked, cut-up vegetables.   cup vegetable juice. Whole Grains  Eat 3 servings a day (1 ounce equals 1 serving). Legumes (such as beans, peas, and lentils)   Eat at least 4 servings a week ( cup equals 1 serving). Nuts and Seeds   Eat at least 4 servings a week ( cup equals 1 serving). Dietary Fiber  Eat 20 to 30 grams a day. Some foods high in dietary fiber include:  Dried beans.  Citrus fruits.  Apples, bananas.  Broccoli, Brussels sprouts, and eggplant.  Oats. Omega-3 Fats  Eat food with omega-3 fats. You can also take a dietary pill (supplement) that has 1 gram of DHA and EPA. Have 3.5 ounces of fatty fish a week, such as:  Salmon.  Mackerel.  [place]bacore tuna.  Sardines.  Lake trout.  Herring. PREPARING YOUR FOOD  Broil, bake, steam, or roast foods. Do not fry food. Do not cook food in butter (fat).  Use non-stick cooking sprays.  Remove skin from poultry, such as chicken and [place].  Remove fat from meat.  Take the fat off the top of stews, soups, and gravy.  Use lemon or herbs to flavor food instead of using butter or margarine.  Use nonfat yogurt, salsa, or low-fat dressings for salads. Document Released: 11/23/2011 Document Reviewed: 11/23/2011 [facility] Patient Information 2015 [place]. This information is not intended to replace advice given to you by your health care provider. Make sure you discuss any questions you have with your health care provider.

## 2014-06-11 NOTE — Progress Notes (Signed)
UR Completed Dacari Beckstrand Graves-Bigelow, RN,BSN [phone]  

## 2014-06-13 ENCOUNTER — Telehealth: Payer: Self-pay

## 2014-06-13 NOTE — Telephone Encounter (Signed)
Patient contacted regarding discharge from [facility] on 06/09/14.  Patient understands to follow up with Dr. Rockey Situ on 06/22/14 at 8:45am at [facility]. Patient understands discharge instructions? yes Patient understands medications and regiment? yes Patient understands to bring all medications to this visit? Yes

## 2014-06-19 ENCOUNTER — Ambulatory Visit (INDEPENDENT_AMBULATORY_CARE_PROVIDER_SITE_OTHER): Payer: Commercial Managed Care - HMO | Admitting: Cardiovascular Disease

## 2014-06-19 ENCOUNTER — Encounter: Payer: Self-pay | Admitting: Cardiovascular Disease

## 2014-06-19 VITALS — BP 128/80 | HR 90 | Ht 69.0 in | Wt 186.5 lb

## 2014-06-19 DIAGNOSIS — N183 Chronic kidney disease, stage 3 unspecified: Secondary | ICD-10-CM

## 2014-06-19 DIAGNOSIS — I200 Unstable angina: Secondary | ICD-10-CM

## 2014-06-19 DIAGNOSIS — E785 Hyperlipidemia, unspecified: Secondary | ICD-10-CM

## 2014-06-19 DIAGNOSIS — R079 Chest pain, unspecified: Secondary | ICD-10-CM

## 2014-06-19 DIAGNOSIS — J449 Chronic obstructive pulmonary disease, unspecified: Secondary | ICD-10-CM

## 2014-06-19 NOTE — Assessment & Plan Note (Signed)
Stable chronic shortness of breath. Stop smoking several years ago

## 2014-06-19 NOTE — Assessment & Plan Note (Signed)
Periodic episodes of chest pain, cardiac workup recently showing no ischemia. Not a good cardiac catheterization candidate given his renal dysfunction

## 2014-06-19 NOTE — Assessment & Plan Note (Addendum)
He has close follow-up with primary care and reports he will have lab work done at their office. Goal LDL less than 70

## 2014-06-19 NOTE — Progress Notes (Signed)
Patient ID: William Mueller, male    DOB: 1951-01-30, 64 y.o.   MRN: [id number]  HPI Comments: HPI Comments: Mr. Hershman is a very pleasant 64 year old gentleman with a long history of smoking, COPD, chronic renal insufficiency, erectile dysfunction, who presented 12/11/2013 to [place] with chest pain. He was taken to the cardiac catheterization lab where he was found to have an occluded mid RCA. Stent was placed. He presents today for follow-up of his coronary artery disease  In general, he reports that he is doing well.  He had recent hospital admission in [facility] for chest pain. He had negative cardiac enzymes, evaluated by cardiology, negative stress test. In follow-up today, he and his wife feel he is having anxiety attacks. He does take Xanax when necessary. Anxiety is been a chronic issue. He calls them his spells. Some shortness of breath from his COPD. Pravastatin previous increased from 10 mg up to 80 mg. No repeat lab panel since that time  EKG on today's visit shows normal sinus rhythm with rate 90 bpm, nonspecific ST abnormality noted  Other past medical history he presented to the hospital 02/26/2014 with chest pain. He seen by cardiology, rule out for MI, had normal EKG and discharged home. Echocardiogram July 2015 showed ejection fraction 55-60%  Creatinine in 10/22/2013 was 2.1  Cardiac catheterization 12/12/2013 Left Main:  Normal Left Anterior Descending (LAD):  Normal in size and mildly calcified. There is diffuse 20% disease throughout the midsegment. The rest of the vessel has minor irregularities. 1st diagonal (D1):  Normal in size with 50% ostial stenosis and 50% proximal stenosis. 2nd diagonal (D2):  Large in size with 30% proximal stenosis. 3rd diagonal (D3):  Very small in size. Circumflex (LCx):  Normal in size and nondominant. The vessel is moderately calcified with diffuse 20% disease in the midsegment. 1st obtuse marginal:  Very small in size. 2nd  obtuse marginal:  Normal in size with no significant disease. 3rd obtuse marginal:  Medium in size with no significant disease.           AV groove continuation segment: Small in size and subtotally occluded proximally.     Allergies  Allergen Reactions  . Benzodiazepines Anaphylaxis and Shortness Of Breath  . Paroxetine Hcl Shortness Of Breath and Palpitations  . Serotonin Reuptake Inhibitors (Ssris) Shortness Of Breath and Palpitations  . Diazepam Other (See Comments)    Rapid heartrate  . Doxycycline Monohydrate Other (See Comments)    Unknown allergic reaction  . Escitalopram Oxalate Other (See Comments)    serotonin Syndrome  . Lorazepam Other (See Comments)    Unknown allergic reaction  . Tetracyclines & Related Itching and Rash    Outpatient Encounter Prescriptions as of 06/19/2014  Medication Sig  . acetaminophen (TYLENOL) 500 MG tablet Take 1,000 mg by mouth 2 (two) times daily as needed for headache.  . albuterol (PROVENTIL HFA;VENTOLIN HFA) 108 (90 BASE) MCG/ACT inhaler Inhale 1-2 puffs into the lungs every 6 (six) hours as needed for wheezing or shortness of breath.  . ALPRAZolam (XANAX XR) 3 MG 24 hr tablet Take 3 mg by mouth every evening. For anxiety control  . ALPRAZolam (XANAX) 1 MG tablet Take 1 mg by mouth daily.   Marland Kitchen aspirin EC 81 MG EC tablet Take 1 tablet (81 mg total) by mouth daily.  . budesonide-formoterol (SYMBICORT) 160-4.5 MCG/ACT inhaler Inhale 2 puffs into the lungs 2 (two) times daily.  . fenofibrate (TRICOR) 145 MG tablet Take 145  mg by mouth every evening.   . Menthol-Methyl Salicylate (MUSCLE RUB EX) Apply 1 application topically daily as needed (pain).   . metoprolol succinate (TOPROL-XL) 25 MG 24 hr tablet Take 12.5 mg by mouth daily.   . nitroGLYCERIN (NITROSTAT) 0.4 MG SL tablet Place 0.4 mg under the tongue every 5 (five) minutes as needed for chest pain. Call 911 after 3rd tablet  . omeprazole (PRILOSEC) 40 MG capsule Take 40 mg by mouth daily.   . pravastatin (PRAVACHOL) 80 MG tablet Take 1 tablet (80 mg total) by mouth at bedtime.  . roflumilast (DALIRESP) 500 MCG TABS tablet Take 500 mcg by mouth daily.  . ticagrelor (BRILINTA) 90 MG TABS tablet Take 1 tablet (90 mg total) by mouth 2 (two) times daily.  Marland Kitchen tiotropium (SPIRIVA) 18 MCG inhalation capsule Place 18 mcg into inhaler and inhale daily.    Past Medical History  Diagnosis Date  . Hypertension   . Anxiety   . COPD (chronic obstructive pulmonary disease)   . High cholesterol   . GERD (gastroesophageal reflux disease)   . Daily headache   . Chronic kidney disease (CKD), stage III (moderate)   . MI (myocardial infarction)     Past Surgical History  Procedure Laterality Date  . No past surgeries    . Cardiac catheterization      MC x 1 stent  . Coronary angioplasty    . Left heart catheterization with coronary angiogram N/A 12/12/2013    Procedure: LEFT HEART CATHETERIZATION WITH CORONARY ANGIOGRAM;  Surgeon: Wellington Hampshire, MD;  Location: [facility];  Service: Cardiovascular;  Laterality: N/A;    Social History  reports that he quit smoking about 4 years ago. His smoking use included Cigarettes. He has a 144 pack-year smoking history. He has never used smokeless tobacco. He reports that he drinks about 3.6 oz of alcohol per week. He reports that he does not use illicit drugs.  Family History family history includes Coronary artery disease in his brother; Heart disease in his father; Heart disease (age of onset: 66) in his brother.    Review of Systems  Constitutional: Negative.   Respiratory: Positive for shortness of breath.   Cardiovascular: Negative.   Gastrointestinal: Negative.   Musculoskeletal: Negative.   Skin: Negative.   Neurological: Negative.   Hematological: Negative.   Psychiatric/Behavioral: The patient is nervous/anxious.   All other systems reviewed and are negative.   BP 128/80 mmHg  Pulse 90  Ht 5\' 9"  (1.753 m)  Wt 186 lb 8 oz  (84.596 kg)  BMI 27.53 kg/m2  Physical Exam  Constitutional: He is oriented to person, place, and time. He appears well-developed and well-nourished.  HENT:  Head: Normocephalic.  Nose: Nose normal.  Mouth/Throat: Oropharynx is clear and moist.  Eyes: Conjunctivae are normal. Pupils are equal, round, and reactive to light.  Neck: Normal range of motion. Neck supple. No JVD present.  Cardiovascular: Normal rate, regular rhythm, S1 normal, S2 normal, normal heart sounds and intact distal pulses.  Exam reveals no gallop and no friction rub.   No murmur heard. Pulmonary/Chest: Effort normal and breath sounds normal. No respiratory distress. He has no wheezes. He has no rales. He exhibits no tenderness.  Abdominal: Soft. Bowel sounds are normal. He exhibits no distension. There is no tenderness.  Musculoskeletal: Normal range of motion. He exhibits no edema or tenderness.  Lymphadenopathy:    He has no cervical adenopathy.  Neurological: He is alert and oriented to person,  place, and time. Coordination normal.  Skin: Skin is warm and dry. No rash noted. No erythema.  Psychiatric: He has a normal mood and affect. His behavior is normal. Judgment and thought content normal.      Assessment and Plan   Nursing note and vitals reviewed.

## 2014-06-19 NOTE — Assessment & Plan Note (Signed)
Creatinine stable at 1.8. Not a good cardiac catheterization candidate

## 2014-06-19 NOTE — Assessment & Plan Note (Signed)
Recent admission to the hospital for chest pain, negative stress test, negative cardiac enzymes. Suspect some of his symptoms are secondary to anxiety. Recommended he talk with primary care about other medication options, possibly SSRI in addition to his Xanax

## 2014-06-19 NOTE — Patient Instructions (Signed)
You are doing well. No medication changes were made.  Please call us if you have new issues that need to be addressed before your next appt.  Your physician wants you to follow-up in: 6 months.  You will receive a reminder letter in the mail two months in advance. If you don't receive a letter, please call our office to schedule the follow-up appointment.   

## 2014-07-22 ENCOUNTER — Other Ambulatory Visit: Payer: Self-pay | Admitting: *Deleted

## 2014-07-22 ENCOUNTER — Telehealth: Payer: Self-pay | Admitting: Cardiovascular Disease

## 2014-07-22 MED ORDER — TICAGRELOR 90 MG PO TABS: 90.0000 mg | ORAL_TABLET | Freq: Two times a day (BID) | ORAL | Status: DC

## 2014-07-22 NOTE — Telephone Encounter (Signed)
°  1. Which medications need to be refilled? Brilinta 90 mg twice daily   2. Which pharmacy is medication to be sent to? [facility] (this has changed from last visit)  3. Do they need a 30 day or 90 day supply? 30 day  4. Would they like a call back once the medication has been sent to the pharmacy? Yes please leave a msg that it is done

## 2014-07-22 NOTE — Telephone Encounter (Signed)
lmovm Rx sent to Local pharmacy for Brilinta 90 mg 30 day supply.

## 2014-08-13 ENCOUNTER — Telehealth: Payer: Self-pay

## 2014-08-13 NOTE — Telephone Encounter (Signed)
Pt wife called, states pt had a "spell" this morning, states pt woke about 5:30. up back of neck was sweating, arm was numb and tightness in his chest. No tightness now. Very weak. Lasted for 10-15 minutes. Please call.

## 2014-08-13 NOTE — Telephone Encounter (Signed)
Attempted to contact pt. No answer, no machine.  

## 2014-08-19 NOTE — Telephone Encounter (Signed)
Attempted to contact pt. No answer, no machine.  

## 2014-08-25 ENCOUNTER — Emergency Department: Payer: Self-pay | Admitting: Emergency Medicine

## 2014-09-22 ENCOUNTER — Other Ambulatory Visit: Payer: Self-pay | Admitting: Cardiovascular Disease

## 2014-09-30 ENCOUNTER — Telehealth: Payer: Self-pay | Admitting: *Deleted

## 2014-09-30 NOTE — Telephone Encounter (Signed)
Pt wife calling for patient. His bp is going up and down This morning it was running low, 107/70 HR 115  Pt c/o BP issue: STAT if pt c/o blurred vision, one-sided weakness or slurred speech  1. What are your last 5 BP readings?  This morning 107/70 HR 115    2. Are you having any other symptoms (ex. Dizziness, headache, blurred vision, passed out)? Chest pain every once in a while.not having one right now.   3. What is your BP issue? It keeps going high and low. Pt states he has no energy.   Pt wife will call us back with more readings, asked patient to write them down and call us back. They are aware and will do this.

## 2014-09-30 NOTE — Telephone Encounter (Signed)
Spoke w/ pt.  He reports that he has some occasional episodes of chest pain that last for "a second or 2" and then resolve. Reports that worked in his garden recently and felt good afterwards.  He has been trying to increase his activity in hopes he will feel better, but states that he has no energy recently. Reports that his HR is high when he wakes up each am, but it decreases during the day.  States that he has not noticed any palpitations or fluttering sensations. He does not have any readings to report other than the one today.  He would like to be seen but our next opening is 5/20. He would like to know if we can see him sooner or if any med changes can be made. Advised him to call back w/ more BP and HR readings.

## 2014-10-14 ENCOUNTER — Telehealth: Payer: Self-pay | Admitting: *Deleted

## 2014-10-14 MED ORDER — NITROGLYCERIN 0.4 MG SL SUBL: 0.4000 mg | SUBLINGUAL_TABLET | SUBLINGUAL | Status: DC | PRN

## 2014-10-14 NOTE — Telephone Encounter (Signed)
°  1. Which medications need to be refilled? Nitrostat   2. Which pharmacy is medication to be sent to? [facility] in [place]   3. Do they need a 30 day or 90 day supply? 30 day   4. Would they like a call back once the medication has been sent to the pharmacy? No   Had it in his clothes and washed clothes, needs another refill Does not take often but would like to have them.

## 2014-10-14 NOTE — Telephone Encounter (Signed)
Refill sent for NTG  

## 2014-11-22 ENCOUNTER — Observation Stay (HOSPITAL_COMMUNITY)
Admission: EM | Admit: 2014-11-22 | Discharge: 2014-11-23 | Disposition: A | Discharge status: Home / Self Care | Payer: PPO | Attending: Internal Medicine | Admitting: Internal Medicine

## 2014-11-22 ENCOUNTER — Encounter (HOSPITAL_COMMUNITY): Payer: Self-pay | Admitting: *Deleted

## 2014-11-22 ENCOUNTER — Emergency Department (HOSPITAL_COMMUNITY): Payer: PPO

## 2014-11-22 DIAGNOSIS — I10 Essential (primary) hypertension: Secondary | ICD-10-CM | POA: Diagnosis present

## 2014-11-22 DIAGNOSIS — E780 Pure hypercholesterolemia: Secondary | ICD-10-CM | POA: Insufficient documentation

## 2014-11-22 DIAGNOSIS — F419 Anxiety disorder, unspecified: Secondary | ICD-10-CM | POA: Diagnosis not present

## 2014-11-22 DIAGNOSIS — I252 Old myocardial infarction: Secondary | ICD-10-CM | POA: Diagnosis not present

## 2014-11-22 DIAGNOSIS — K219 Gastro-esophageal reflux disease without esophagitis: Secondary | ICD-10-CM | POA: Diagnosis not present

## 2014-11-22 DIAGNOSIS — N183 Chronic kidney disease, stage 3 unspecified: Secondary | ICD-10-CM | POA: Diagnosis present

## 2014-11-22 DIAGNOSIS — I25118 Atherosclerotic heart disease of native coronary artery with other forms of angina pectoris: Secondary | ICD-10-CM | POA: Diagnosis present

## 2014-11-22 DIAGNOSIS — R0789 Other chest pain: Principal | ICD-10-CM | POA: Insufficient documentation

## 2014-11-22 DIAGNOSIS — Z7951 Long term (current) use of inhaled steroids: Secondary | ICD-10-CM | POA: Diagnosis not present

## 2014-11-22 DIAGNOSIS — I25110 Atherosclerotic heart disease of native coronary artery with unstable angina pectoris: Secondary | ICD-10-CM | POA: Diagnosis present

## 2014-11-22 DIAGNOSIS — Z79899 Other long term (current) drug therapy: Secondary | ICD-10-CM | POA: Insufficient documentation

## 2014-11-22 DIAGNOSIS — R079 Chest pain, unspecified: Secondary | ICD-10-CM | POA: Diagnosis present

## 2014-11-22 DIAGNOSIS — E785 Hyperlipidemia, unspecified: Secondary | ICD-10-CM | POA: Diagnosis present

## 2014-11-22 DIAGNOSIS — J449 Chronic obstructive pulmonary disease, unspecified: Secondary | ICD-10-CM | POA: Diagnosis not present

## 2014-11-22 DIAGNOSIS — Z87891 Personal history of nicotine dependence: Secondary | ICD-10-CM | POA: Diagnosis not present

## 2014-11-22 DIAGNOSIS — I129 Hypertensive chronic kidney disease with stage 1 through stage 4 chronic kidney disease, or unspecified chronic kidney disease: Secondary | ICD-10-CM | POA: Diagnosis not present

## 2014-11-22 DIAGNOSIS — Z7982 Long term (current) use of aspirin: Secondary | ICD-10-CM | POA: Diagnosis not present

## 2014-11-22 DIAGNOSIS — R110 Nausea: Secondary | ICD-10-CM | POA: Diagnosis not present

## 2014-11-22 LAB — CBC WITH DIFFERENTIAL/PLATELET
Basophils Absolute: 0 10*3/uL (ref 0.0–0.1)
Basophils Relative: 1 % (ref 0–1)
Eosinophils Absolute: 0.5 10*3/uL (ref 0.0–0.7)
Eosinophils Relative: 7 % — ABNORMAL HIGH (ref 0–5)
HCT: 40.5 % (ref 39.0–52.0)
Hemoglobin: 14.3 g/dL (ref 13.0–17.0)
Lymphocytes Relative: 33 % (ref 12–46)
Lymphs Abs: 2.2 10*3/uL (ref 0.7–4.0)
MCH: 30.4 pg (ref 26.0–34.0)
MCHC: 35.3 g/dL (ref 30.0–36.0)
MCV: 86 fL (ref 78.0–100.0)
Monocytes Absolute: 0.7 10*3/uL (ref 0.1–1.0)
Monocytes Relative: 10 % (ref 3–12)
Neutro Abs: 3.2 10*3/uL (ref 1.7–7.7)
Neutrophils Relative %: 49 % (ref 43–77)
Platelets: 307 10*3/uL (ref 150–400)
RBC: 4.71 MIL/uL (ref 4.22–5.81)
RDW: 12.3 % (ref 11.5–15.5)
WBC: 6.6 10*3/uL (ref 4.0–10.5)

## 2014-11-22 LAB — BASIC METABOLIC PANEL
Anion gap: 8 (ref 5–15)
BUN: 13 mg/dL (ref 6–20)
CO2: 27 mmol/L (ref 22–32)
Calcium: 9.6 mg/dL (ref 8.9–10.3)
Chloride: 102 mmol/L (ref 101–111)
Creatinine, Ser: 1.9 mg/dL — ABNORMAL HIGH (ref 0.61–1.24)
GFR calc Af Amer: 41 mL/min — ABNORMAL LOW (ref >60)
GFR calc non Af Amer: 35 mL/min — ABNORMAL LOW (ref >60)
Glucose, Bld: 105 mg/dL — ABNORMAL HIGH (ref 65–99)
Potassium: 3.6 mmol/L (ref 3.5–5.1)
Sodium: 137 mmol/L (ref 135–145)

## 2014-11-22 LAB — URINALYSIS, ROUTINE W REFLEX MICROSCOPIC
Bilirubin Urine: NEGATIVE
GLUCOSE, UA: NEGATIVE mg/dL
Hgb urine dipstick: NEGATIVE
KETONES UR: NEGATIVE mg/dL
LEUKOCYTES UA: NEGATIVE
Nitrite: NEGATIVE
PROTEIN: NEGATIVE mg/dL
Specific Gravity, Urine: 1.004 — ABNORMAL LOW (ref 1.005–1.030)
UROBILINOGEN UA: 0.2 mg/dL (ref 0.0–1.0)
pH: 7 (ref 5.0–8.0)

## 2014-11-22 LAB — BRAIN NATRIURETIC PEPTIDE: B Natriuretic Peptide: 10.6 pg/mL (ref 0.0–100.0)

## 2014-11-22 LAB — TROPONIN I: Troponin I: <0.03 ng/mL (ref <0.031)

## 2014-11-22 NOTE — ED Notes (Signed)
The pt is c/o chest tightness for  One week intermittently he took sl nitro that helped.    He is also c/o weakness

## 2014-11-22 NOTE — ED Notes (Signed)
[place] CELL PHONE NUMBER HAS CHANGED:  PLEASE CALL [phone]

## 2014-11-22 NOTE — ED Notes (Signed)
PA at bedside.

## 2014-11-22 NOTE — ED Provider Notes (Signed)
Medical screening examination/treatment/procedure(s) were conducted as a shared visit with non-physician practitioner(s) and myself.  I personally evaluated the patient during the encounter.   EKG Interpretation   Date/Time:  Friday November 22 2014 21:04:53 EDT Ventricular Rate:  98 PR Interval:  146 QRS Duration: 82 QT Interval:  336 QTC Calculation: 428 R Axis:   78 Text Interpretation:  Normal sinus rhythm Nonspecific T wave abnormality  Abnormal ECG latreal t-wave changes new when compared to prior Confirmed  by Tajae Maiolo  MD, Zaira Iacovelli ([id number]) on 11/22/2014 10:56:48 PM      Patient here with one week of intermittent substernal chest pain which was lasting minutes to seconds. Today became so severe he took nitroglycerin which relieved his symptoms. Patient's EKG does show some new lateral T-wave changes. Will be admitted for observation    Lacretia Leigh, MD 11/22/14 2257

## 2014-11-22 NOTE — ED Notes (Signed)
Registration at bedside.

## 2014-11-22 NOTE — ED Provider Notes (Signed)
CSN: [id number]     Arrival date & time 11/22/14  2100 History   First MD Initiated Contact with Patient 11/22/14 2135     Chief Complaint  Patient presents with  . Chest Pain     (Consider location/radiation/quality/duration/timing/severity/associated sxs/prior Treatment) HPI    PCP: HAMRICK,MAURA L, MD Blood pressure 143/78, pulse 88, temperature 98.7 F (37.1 C), temperature source Oral, resp. rate 14, SpO2 99 %.  William Mueller is a 64 y.o.male with a significant PMH of hypertension, anxiety, COPD, high cholesterol, GERD, daily headache, chronic kidney disease, MI presents to the ER with complaints of intermittent chest tightness over the past week. He has had some nausea and come intermittent chest pains that last a few minutes to a few seconds. He was able to take nitro and the symptoms resolved. His EKG shows lateral T-wave changes. He came to the ER because he became very concerned with his symptoms and how they have been persisting that he decided to come to the ER today.   The patient denies diaphoresis, fever, headache, weakness (general or focal), confusion, change of vision,  neck pain, dysphagia, aphagia,  back pain, abdominal pains, nausea, vomiting, diarrhea, lower extremity swelling, rash.   Past Medical History  Diagnosis Date  . Hypertension   . Anxiety   . COPD (chronic obstructive pulmonary disease)   . High cholesterol   . GERD (gastroesophageal reflux disease)   . Daily headache   . Chronic kidney disease (CKD), stage III (moderate)   . MI (myocardial infarction)    Past Surgical History  Procedure Laterality Date  . No past surgeries    . Cardiac catheterization      MC x 1 stent  . Coronary angioplasty    . Left heart catheterization with coronary angiogram N/A 12/12/2013    Procedure: LEFT HEART CATHETERIZATION WITH CORONARY ANGIOGRAM;  Surgeon: Wellington Hampshire, MD;  Location: [facility]ATH [facility];  Service: Cardiovascular;  Laterality: N/A;   Family History   Problem Relation Age of Onset  . Coronary artery disease Brother   . Heart disease Brother 47  . Heart disease Father     MI x 8   History  Substance Use Topics  . Smoking status: Former Smoker -- 3.00 packs/day for 48 years    Types: Cigarettes    Quit date: 06/07/2010  . Smokeless tobacco: Never Used     Comment: Using electronic cigarette  . Alcohol Use: 3.6 oz/week    6 Cans of beer per week    Review of Systems  10 Systems reviewed and are negative for acute change except as noted in the HPI.    Allergies  Benzodiazepines; Paroxetine hcl; Serotonin reuptake inhibitors (ssris); Diazepam; Doxycycline monohydrate; Escitalopram oxalate; Lorazepam; and Tetracyclines & related  Home Medications   Prior to Admission medications   Medication Sig Start Date End Date Taking? Authorizing Provider  acetaminophen (TYLENOL) 500 MG tablet Take 1,000 mg by mouth 2 (two) times daily as needed for headache.   Yes Historical Provider, MD  albuterol (PROVENTIL HFA;VENTOLIN HFA) 108 (90 BASE) MCG/ACT inhaler Inhale 1-2 puffs into the lungs every 6 (six) hours as needed for wheezing or shortness of breath.   Yes Historical Provider, MD  ALPRAZolam (XANAX XR) 3 MG 24 hr tablet Take 3 mg by mouth every evening. For anxiety control   Yes Historical Provider, MD  ALPRAZolam Duanne Moron) 1 MG tablet Take 1 mg by mouth daily.  11/21/13  Yes Historical Provider, MD  aspirin EC 81 MG EC tablet Take 1 tablet (81 mg total) by mouth daily. 12/14/13  Yes Karlene Einstein, MD  budesonide-formoterol [facility]) 160-4.5 MCG/ACT inhaler Inhale 2 puffs into the lungs 2 (two) times daily.   Yes Historical Provider, MD  fenofibrate (TRICOR) 145 MG tablet Take 145 mg by mouth every evening.    Yes Historical Provider, MD  Menthol-Methyl Salicylate (MUSCLE RUB EX) Apply 1 application topically daily as needed (pain).  06/03/14  Yes Historical Provider, MD  metoprolol succinate (TOPROL-XL) 25 MG 24 hr tablet TAKE ONE-HALF  TABLET BY MOUTH DAILY 09/23/14  Yes Minna Merritts, MD  nitroGLYCERIN (NITROSTAT) 0.4 MG SL tablet Place 1 tablet (0.4 mg total) under the tongue every 5 (five) minutes as needed for chest pain. Call 911 after 3rd tablet 10/14/14  Yes Minna Merritts, MD  omeprazole (PRILOSEC) 40 MG capsule Take 40 mg by mouth daily.   Yes Historical Provider, MD  pravastatin (PRAVACHOL) 80 MG tablet Take 1 tablet (80 mg total) by mouth at bedtime. 12/20/13  Yes Minna Merritts, MD  roflumilast (DALIRESP) 500 MCG TABS tablet Take 500 mcg by mouth daily.   Yes Historical Provider, MD  ticagrelor (BRILINTA) 90 MG TABS tablet Take 1 tablet (90 mg total) by mouth 2 (two) times daily. 07/22/14  Yes Minna Merritts, MD  tiotropium (SPIRIVA) 18 MCG inhalation capsule Place 18 mcg into inhaler and inhale daily.   Yes Historical Provider, MD   BP 143/78 mmHg  Pulse 88  Temp(Src) 98.7 F (37.1 C) (Oral)  Resp 14  SpO2 99% Physical Exam  Constitutional: He appears well-developed and well-nourished. No distress.  HENT:  Head: Normocephalic and atraumatic.  Eyes: Pupils are equal, round, and reactive to light.  Neck: Normal range of motion. Neck supple.  Cardiovascular: Normal rate and regular rhythm.   Pulmonary/Chest: Effort normal and breath sounds normal. He has no decreased breath sounds. He has no wheezes.  Abdominal: Soft.  Neurological: He is alert.  Skin: Skin is warm and dry.  Nursing note and vitals reviewed.   ED Course  Procedures (including critical care time) Labs Review Labs Reviewed  BASIC METABOLIC PANEL - Abnormal; Notable for the following:    Glucose, Bld 105 (*)    Creatinine, Ser 1.90 (*)    GFR calc non Af Amer 35 (*)    GFR calc Af Amer 41 (*)    All other components within normal limits  CBC WITH DIFFERENTIAL/PLATELET - Abnormal; Notable for the following:    Eosinophils Relative 7 (*)    All other components within normal limits  URINALYSIS, ROUTINE W REFLEX MICROSCOPIC (NOT AT  [facility]) - Abnormal; Notable for the following:    Specific Gravity, Urine 1.004 (*)    All other components within normal limits  BRAIN NATRIURETIC PEPTIDE  TROPONIN I   Imaging Review Dg Chest 2 View  11/22/2014   CLINICAL DATA:  Chest pain for 1 week.  EXAM: CHEST  2 VIEW  COMPARISON:  06/08/2014  FINDINGS: Hyperinflation likely due to emphysema. The heart size and mediastinal contours are within normal limits. Both lungs are clear. The visualized skeletal structures are unremarkable. Coronary artery stent.  IMPRESSION: Emphysematous changes in the lungs. No active cardiopulmonary disease.   Electronically Signed   By: Lucienne Capers M.D.   On: 11/22/2014 21:32    EKG Interpretation   Date/Time:  Friday November 22 2014 21:04:53 EDT Ventricular Rate:  98 PR Interval:  146 QRS Duration:  82 QT Interval:  336 QTC Calculation: 428 R Axis:   78 Text Interpretation:  Normal sinus rhythm Nonspecific T wave abnormality  Abnormal ECG latreal t-wave changes new when compared to prior Confirmed  by ALLEN  MD, ANTHONY ([id number]) on 11/22/2014 10:56:48 PM     MDM   Final diagnoses:  Chest pain, unspecified chest pain type    Cardiac catheterization 12/12/2013 Left Main: Normal Left Anterior Descending (LAD): Normal in size and mildly calcified. There is diffuse 20% disease throughout the midsegment. The rest of the vessel has minor irregularities. 1st diagonal (D1): Normal in size with 50% ostial stenosis and 50% proximal stenosis. 2nd diagonal (D2): Large in size with 30% proximal stenosis. 3rd diagonal (D3): Very small in size. Circumflex (LCx): Normal in size and nondominant. The vessel is moderately calcified with diffuse 20% disease in the midsegment. 1st obtuse marginal: Very small in size. 2nd obtuse marginal: Normal in size with no significant disease. 3rd obtuse marginal: Medium in size with no significant disease.  AV groove continuation segment: Small in size and  subtotally occluded proximally.  Trop is negative, Dr. Zenia Resides has seen patient as well and feels that admission and chest pain rule out of the appropriate treatment.  Spoke with Dr. Posey Pronto, Temp admit orders placed for Tele, obs with [facility] Admits. Patient CURRENTLY confirms he is pain free.  Filed Vitals:   11/22/14 2158  BP: 143/78  Pulse: 88  Temp: 98.7 F (37.1 C)  Resp: [id number], PA-C 11/22/14 2324

## 2014-11-23 ENCOUNTER — Observation Stay (HOSPITAL_COMMUNITY): Payer: PPO

## 2014-11-23 DIAGNOSIS — N183 Chronic kidney disease, stage 3 (moderate): Secondary | ICD-10-CM

## 2014-11-23 DIAGNOSIS — R079 Chest pain, unspecified: Secondary | ICD-10-CM | POA: Diagnosis present

## 2014-11-23 DIAGNOSIS — E785 Hyperlipidemia, unspecified: Secondary | ICD-10-CM

## 2014-11-23 DIAGNOSIS — J438 Other emphysema: Secondary | ICD-10-CM

## 2014-11-23 DIAGNOSIS — I2510 Atherosclerotic heart disease of native coronary artery without angina pectoris: Secondary | ICD-10-CM | POA: Diagnosis not present

## 2014-11-23 DIAGNOSIS — I10 Essential (primary) hypertension: Secondary | ICD-10-CM

## 2014-11-23 DIAGNOSIS — K210 Gastro-esophageal reflux disease with esophagitis: Secondary | ICD-10-CM

## 2014-11-23 DIAGNOSIS — J449 Chronic obstructive pulmonary disease, unspecified: Secondary | ICD-10-CM

## 2014-11-23 DIAGNOSIS — I25110 Atherosclerotic heart disease of native coronary artery with unstable angina pectoris: Secondary | ICD-10-CM

## 2014-11-23 LAB — CBC WITH DIFFERENTIAL/PLATELET
Basophils Absolute: 0.1 10*3/uL (ref 0.0–0.1)
Basophils Relative: 1 % (ref 0–1)
EOS ABS: 0.5 10*3/uL (ref 0.0–0.7)
EOS PCT: 7 % — AB (ref 0–5)
HEMATOCRIT: 38.7 % — AB (ref 39.0–52.0)
Hemoglobin: 13.2 g/dL (ref 13.0–17.0)
LYMPHS ABS: 2.5 10*3/uL (ref 0.7–4.0)
Lymphocytes Relative: 35 % (ref 12–46)
MCH: 29.6 pg (ref 26.0–34.0)
MCHC: 34.1 g/dL (ref 30.0–36.0)
MCV: 86.8 fL (ref 78.0–100.0)
MONO ABS: 0.7 10*3/uL (ref 0.1–1.0)
MONOS PCT: 9 % (ref 3–12)
Neutro Abs: 3.5 10*3/uL (ref 1.7–7.7)
Neutrophils Relative %: 48 % (ref 43–77)
PLATELETS: 281 10*3/uL (ref 150–400)
RBC: 4.46 MIL/uL (ref 4.22–5.81)
RDW: 12.5 % (ref 11.5–15.5)
WBC: 7.2 10*3/uL (ref 4.0–10.5)

## 2014-11-23 LAB — TROPONIN I

## 2014-11-23 LAB — COMPREHENSIVE METABOLIC PANEL
ALT: 26 U/L (ref 17–63)
ANION GAP: 9 (ref 5–15)
AST: 26 U/L (ref 15–41)
Albumin: 3.7 g/dL (ref 3.5–5.0)
Alkaline Phosphatase: 53 U/L (ref 38–126)
BUN: 13 mg/dL (ref 6–20)
CHLORIDE: 102 mmol/L (ref 101–111)
CO2: 27 mmol/L (ref 22–32)
CREATININE: 1.92 mg/dL — AB (ref 0.61–1.24)
Calcium: 9.1 mg/dL (ref 8.9–10.3)
GFR calc Af Amer: 41 mL/min — ABNORMAL LOW (ref >60)
GFR, EST NON AFRICAN AMERICAN: 35 mL/min — AB (ref >60)
Glucose, Bld: 99 mg/dL (ref 65–99)
Potassium: 3.9 mmol/L (ref 3.5–5.1)
Sodium: 138 mmol/L (ref 135–145)
Total Bilirubin: 0.7 mg/dL (ref 0.3–1.2)
Total Protein: 6.6 g/dL (ref 6.5–8.1)

## 2014-11-23 LAB — PROTIME-INR
INR: 1.08 (ref 0.00–1.49)
Prothrombin Time: 14.2 seconds (ref 11.6–15.2)

## 2014-11-23 MED ORDER — METOPROLOL SUCCINATE ER 25 MG PO TB24
25.0000 mg | ORAL_TABLET | Freq: Every day | ORAL | Status: DC
  Administered 2014-11-23: 25 mg via ORAL
  Filled 2014-11-23: qty 1

## 2014-11-23 MED ORDER — ISOSORBIDE MONONITRATE ER 30 MG PO TB24
30.0000 mg | ORAL_TABLET | Freq: Every day | ORAL | Status: DC
Start: 2014-11-23 — End: 2014-11-23
  Administered 2014-11-23 (×2): 30 mg via ORAL
  Filled 2014-11-23 (×2): qty 1

## 2014-11-23 MED ORDER — ISOSORBIDE MONONITRATE ER 30 MG PO TB24: 30.0000 mg | ORAL_TABLET | Freq: Every day | ORAL | Status: DC

## 2014-11-23 MED ORDER — MORPHINE SULFATE 2 MG/ML IJ SOLN: 2.0000 mg | INTRAMUSCULAR | Status: DC | PRN

## 2014-11-23 MED ORDER — ALBUTEROL SULFATE HFA 108 (90 BASE) MCG/ACT IN AERS: 1.0000 | INHALATION_SPRAY | Freq: Four times a day (QID) | RESPIRATORY_TRACT | Status: DC | PRN

## 2014-11-23 MED ORDER — ALPRAZOLAM 0.5 MG PO TABS: 1.0000 mg | ORAL_TABLET | Freq: Three times a day (TID) | ORAL | Status: DC | PRN

## 2014-11-23 MED ORDER — ALPRAZOLAM 0.5 MG PO TABS: 1.0000 mg | ORAL_TABLET | Freq: Three times a day (TID) | ORAL | Status: DC

## 2014-11-23 MED ORDER — BUDESONIDE-FORMOTEROL FUMARATE 160-4.5 MCG/ACT IN AERO
2.0000 | INHALATION_SPRAY | Freq: Two times a day (BID) | RESPIRATORY_TRACT | Status: DC
Start: 2014-11-23 — End: 2014-11-23
  Administered 2014-11-23: 2 via RESPIRATORY_TRACT
  Filled 2014-11-23: qty 6

## 2014-11-23 MED ORDER — METOPROLOL SUCCINATE ER 25 MG PO TB24: 12.5000 mg | ORAL_TABLET | Freq: Every day | ORAL | Status: DC

## 2014-11-23 MED ORDER — ACETAMINOPHEN 325 MG PO TABS: 650.0000 mg | ORAL_TABLET | ORAL | Status: DC | PRN

## 2014-11-23 MED ORDER — ALPRAZOLAM 0.5 MG PO TABS: 1.0000 mg | ORAL_TABLET | Freq: Every day | ORAL | Status: DC

## 2014-11-23 MED ORDER — PRAVASTATIN SODIUM 80 MG PO TABS
80.0000 mg | ORAL_TABLET | Freq: Every day | ORAL | Status: DC
  Filled 2014-11-23 (×2): qty 1

## 2014-11-23 MED ORDER — HEPARIN SODIUM (PORCINE) 5000 UNIT/ML IJ SOLN: 5000.0000 [IU] | Freq: Three times a day (TID) | INTRAMUSCULAR | Status: DC

## 2014-11-23 MED ORDER — METOPROLOL SUCCINATE ER 25 MG PO TB24: 25.0000 mg | ORAL_TABLET | Freq: Every day | ORAL | Status: DC

## 2014-11-23 MED ORDER — ASPIRIN EC 81 MG PO TBEC
81.0000 mg | DELAYED_RELEASE_TABLET | Freq: Every day | ORAL | Status: DC
  Administered 2014-11-23: 81 mg via ORAL
  Filled 2014-11-23: qty 1

## 2014-11-23 MED ORDER — TICAGRELOR 90 MG PO TABS
90.0000 mg | ORAL_TABLET | Freq: Two times a day (BID) | ORAL | Status: DC
  Administered 2014-11-23: 90 mg via ORAL
  Filled 2014-11-23: qty 1

## 2014-11-23 MED ORDER — PANTOPRAZOLE SODIUM 40 MG PO TBEC
40.0000 mg | DELAYED_RELEASE_TABLET | Freq: Every day | ORAL | Status: DC
  Administered 2014-11-23: 40 mg via ORAL
  Filled 2014-11-23: qty 1

## 2014-11-23 MED ORDER — ALBUTEROL SULFATE (2.5 MG/3ML) 0.083% IN NEBU: 3.0000 mL | INHALATION_SOLUTION | Freq: Four times a day (QID) | RESPIRATORY_TRACT | Status: DC | PRN

## 2014-11-23 MED ORDER — TIOTROPIUM BROMIDE MONOHYDRATE 18 MCG IN CAPS
18.0000 ug | ORAL_CAPSULE | Freq: Every day | RESPIRATORY_TRACT | Status: DC
  Administered 2014-11-23: 18 ug via RESPIRATORY_TRACT
  Filled 2014-11-23: qty 5

## 2014-11-23 MED ORDER — ONDANSETRON HCL 4 MG/2ML IJ SOLN: 4.0000 mg | Freq: Four times a day (QID) | INTRAMUSCULAR | Status: DC | PRN

## 2014-11-23 NOTE — Consult Note (Signed)
Admit date: 11/22/2014 Referring Physician  William Mueller Primary Physician William Sake, MD Primary Cardiologist  William Mueller Reason for Consultation  Chest pain  HPI: 64 year old male with coronary artery disease, occluded mid RCA, long standing history of smoking quit 5 years ago, COPD, chronic kidney disease, rectal dysfunction with previous chest pain episodes here once again with chest discomfort. He also has a history of anxiety and does take Xanax when necessary. This is been a chronic issue.  He stated that he had chest pain located centrally, pressure occurring on and off over the past few days and seems to be worsening today. It was severe and nitroglycerin seemed to improve the pain. He has shortness of breath associated with this. Denies any recent fevers or long travels. No change in his medications. The pain however is less severe than his recent admission in January when nuclear stress test was performed and was normal, low risk.  On 02/26/14 he was admitted with chest pain, seen by cardiology, ruled out for myocardial infarction, discharged home. Echocardiogram in July 2015 showed normal ejection fraction of 60%. Creatinine has been 2.1.  On 12/12/2013 he underwent cardiac catheterization with the following findings:  Left Main: Normal Left Anterior Descending (LAD): Normal in size and mildly calcified. There is diffuse 20% disease throughout the midsegment. The rest of the vessel has minor irregularities. 1st diagonal (D1): Normal in size with 50% ostial stenosis and 50% proximal stenosis. 2nd diagonal (D2): Large in size with 30% proximal stenosis. 3rd diagonal (D3): Very small in size. Circumflex (LCx): Normal in size and nondominant. The vessel is moderately calcified with diffuse 20% disease in the midsegment. 1st obtuse marginal: Very small in size. 2nd obtuse marginal: Normal in size with no significant disease. 3rd obtuse marginal: Medium in size with no  significant disease.  AV groove continuation segment: Small in size and subtotally occluded proximally.  Right Coronary Artery: Normal in size and dominant. The vessel is mildly calcified with diffuse 40% disease proximally. The vessel is occluded in the midsegment with TIMI 1 flow. Faint left to right collaterals were noted.  Posterior descending artery: Normal in size with minor irregularities.  PCI Data: Vessel - RCA/Segment - mid Percent Stenosis (pre) 100% TIMI-flow: 1 Stent : 2.75 x 28 mm Xience drug-eluting stent Percent Stenosis (post) 0% TIMI-flow (post) 3   Final Conclusions:  1. Severe one-vessel coronary artery disease with an occluded mid right coronary artery. This is likely acute on chronic and is likely the culprit for patient's presentation. The AV groove artery from the left circumflex is subtotally occluded but that's very small. 2. Mildly elevated left ventricular end-diastolic pressure. 3. Successful angioplasty and drug-eluting stent placement to the right coronary artery.   On 06/09/14 he was had chest pain once again and a nuclear stress test was performed with no ischemia, low risk, EF 53%.  Currently feels better, no CP. Would like to go home.      PMH:   Past Medical History  Diagnosis Date  . Hypertension   . Anxiety   . COPD (chronic obstructive pulmonary disease)   . High cholesterol   . GERD (gastroesophageal reflux disease)   . Daily headache   . Chronic kidney disease (CKD), stage III (moderate)   . MI (myocardial infarction)     PSH:   Past Surgical History  Procedure Laterality Date  . No past surgeries    . Cardiac catheterization      MC x 1 stent  .  Coronary angioplasty    . Left heart catheterization with coronary angiogram N/A 12/12/2013    Procedure: LEFT HEART CATHETERIZATION WITH CORONARY ANGIOGRAM;  Surgeon: William Hampshire, MD;  Location: [facility];  Service: Cardiovascular;  Laterality: N/A;   Allergies:   Benzodiazepines; Paroxetine hcl; Serotonin reuptake inhibitors (ssris); Diazepam; Doxycycline monohydrate; Escitalopram oxalate; Lorazepam; and Tetracyclines & related Prior to Admit Meds:   Prior to Admission medications   Medication Sig Start Date End Date Taking? Authorizing Provider  acetaminophen (TYLENOL) 500 MG tablet Take 1,000 mg by mouth 2 (two) times daily as needed for headache.   Yes Historical Provider, MD  albuterol (PROVENTIL HFA;VENTOLIN HFA) 108 (90 BASE) MCG/ACT inhaler Inhale 1-2 puffs into the lungs every 6 (six) hours as needed for wheezing or shortness of breath.   Yes Historical Provider, MD  ALPRAZolam (XANAX XR) 3 MG 24 hr tablet Take 3 mg by mouth every evening. For anxiety control   Yes Historical Provider, MD  ALPRAZolam Duanne Moron) 1 MG tablet Take 1 mg by mouth daily.  11/21/13  Yes Historical Provider, MD  aspirin EC 81 MG EC tablet Take 1 tablet (81 mg total) by mouth daily. 12/14/13  Yes William Einstein, MD  budesonide-formoterol [facility]) 160-4.5 MCG/ACT inhaler Inhale 2 puffs into the lungs 2 (two) times daily.   Yes Historical Provider, MD  fenofibrate (TRICOR) 145 MG tablet Take 145 mg by mouth every evening.    Yes Historical Provider, MD  Menthol-Methyl Salicylate (MUSCLE RUB EX) Apply 1 application topically daily as needed (pain).  06/03/14  Yes Historical Provider, MD  metoprolol succinate (TOPROL-XL) 25 MG 24 hr tablet TAKE ONE-HALF TABLET BY MOUTH DAILY 09/23/14  Yes William Merritts, MD  nitroGLYCERIN (NITROSTAT) 0.4 MG SL tablet Place 1 tablet (0.4 mg total) under the tongue every 5 (five) minutes as needed for chest pain. Call 911 after 3rd tablet 10/14/14  Yes William Merritts, MD  omeprazole (PRILOSEC) 40 MG capsule Take 40 mg by mouth daily.   Yes Historical Provider, MD  pravastatin (PRAVACHOL) 80 MG tablet Take 1 tablet (80 mg total) by mouth at bedtime. 12/20/13  Yes William Merritts, MD  ticagrelor (BRILINTA) 90 MG TABS tablet Take 1 tablet (90 mg total)  by mouth 2 (two) times daily. 07/22/14  Yes William Merritts, MD  tiotropium (SPIRIVA) 18 MCG inhalation capsule Place 18 mcg into inhaler and inhale daily.   Yes Historical Provider, MD   Fam HX:    Family History  Problem Relation Age of Onset  . Coronary artery disease Brother   . Heart disease Brother 65  . Heart disease Father     MI x 8   Social HX:    History   Social History  . Marital Status: Married    Spouse Name: N/A  . Number of Children: N/A  . Years of Education: N/A   Occupational History  . Not on file.   Social History Main Topics  . Smoking status: Former Smoker -- 3.00 packs/day for 48 years    Types: Cigarettes    Quit date: 06/07/2010  . Smokeless tobacco: Never Used     Comment: Using electronic cigarette  . Alcohol Use: 3.6 oz/week    6 Cans of beer per week  . Drug Use: No  . Sexual Activity: Not Currently   Other Topics Concern  . Not on file   Social History Narrative     ROS:  No bleeding, no syncope, no  orthopnea. All 11 ROS were addressed and are negative except what is stated in the HPI   Physical Exam: Blood pressure 122/89, pulse 79, temperature 98.6 F (37 C), temperature source Oral, resp. rate 18, height 5\' 9"  (1.753 m), weight 179 lb 1.6 oz (81.239 kg), SpO2 99 %.   General: Well developed, well nourished, in no acute distress Head: Eyes PERRLA, No xanthomas.   Normal cephalic and atramatic  Lungs:   Clear bilaterally to auscultation and percussion. Normal respiratory effort. No wheezes, no rales. (COPD decreased air movement) Heart:   HRRR S1 S2 Pulses are 2+ & equal. No murmur, rubs, gallops.  No carotid bruit. No JVD.  No abdominal bruits.  Abdomen: Bowel sounds are positive, abdomen soft and non-tender without masses. No hepatosplenomegaly. Msk:  Back normal. Normal strength and tone for age. Extremities:  No clubbing, cyanosis or edema.  DP +1 Neuro: Alert and oriented X 3, non-focal, MAE x 4 GU: Deferred Rectal:  Deferred Psych:  Good affect, responds appropriately      Labs: Lab Results  Component Value Date   WBC 7.2 11/23/2014   HGB 13.2 11/23/2014   HCT 38.7* 11/23/2014   MCV 86.8 11/23/2014   PLT 281 11/23/2014     Recent Labs Lab 11/23/14 0300  NA 138  K 3.9  CL 102  CO2 27  BUN 13  CREATININE 1.92*  CALCIUM 9.1  PROT 6.6  BILITOT 0.7  ALKPHOS 53  ALT 26  AST 26  GLUCOSE 99    Recent Labs  11/22/14 2128 11/23/14 0300  TROPONINI <0.03 <0.03   Lab Results  Component Value Date   CHOL 201* 12/12/2013   HDL 52 12/12/2013   LDLCALC 123* 12/12/2013   TRIG 131 12/12/2013   No results found for: DDIMER   Radiology:  Dg Chest 2 View  11/22/2014   CLINICAL DATA:  Chest pain for 1 week.  EXAM: CHEST  2 VIEW  COMPARISON:  06/08/2014  FINDINGS: Hyperinflation likely due to emphysema. The heart size and mediastinal contours are within normal limits. Both lungs are clear. The visualized skeletal structures are unremarkable. Coronary artery stent.  IMPRESSION: Emphysematous changes in the lungs. No active cardiopulmonary disease.   Electronically Signed   By: Lucienne Capers M.D.   On: 11/22/2014 21:32   Personally viewed.  EKG:  Sinus rhythm, 98 with nonspecific T-wave abnormalities. T-wave inversion in V4 through 6 accentuated when compared to prior EKG. Personally viewed.   Scheduled Meds: . aspirin EC  81 mg Oral Daily  . budesonide-formoterol  2 puff Inhalation BID  . heparin  5,000 Units Subcutaneous 3 times per day  . isosorbide mononitrate  30 mg Oral Daily  . metoprolol succinate  12.5 mg Oral Daily  . pantoprazole  40 mg Oral Daily  . pravastatin  80 mg Oral QHS  . ticagrelor  90 mg Oral BID  . tiotropium  18 mcg Inhalation Daily   Continuous Infusions:  PRN Meds:.acetaminophen, albuterol, ALPRAZolam, morphine injection, ondansetron (ZOFRAN) IV   ASSESSMENT/PLAN:    64 year old male with coronary artery disease with PCI to chronic total occlusion of the  mid RCA segment on 12/12/13 with subsequent nuclear stress test in January 2016 that was low risk with no ischemia here with chest pain, nonspecific T-wave changes on ECG, normal troponin, former smoker, COPD, anxiety.  1. Chest pain-given his normal troponins and only nonspecific T-wave changes on ECG and normal nuclear stress test in January 2016, we will continue with  medical management, agree with new start isosorbide 30 mg once a day to his drug regimen. He felt mild HA, but chest feels better he states.  Continue with aspirin, beta blocker, will increase to 25 mg. For now, continue dual antiplatelet therapy. His 1 year anniversary of stent will be in July of this year. At that point, it would be reasonable to discontinue Brilinta.  2. History of smoking - quit 5 years ago. Excellent.   3. COPD-per primary team. Medication reviewed.  OK with DC home with close follow up with William Mueller.     Candee Furbish, MD  11/23/2014  9:01 AM

## 2014-11-23 NOTE — H&P (Addendum)
[facility] History and Physical  Patient: William Mueller  MRN: [id number]  DOB: [birth year]  DOS: the patient was seen and examined on 11/23/2014 PCP: Leonides Sake, MD  Chief Complaint: Chest pain  HPI: William Mueller is a 64 y.o. male with Past medical history of coronary artery disease status post PCI to RCA, COPD, hypertension, dyslipidemia, GERD, chronic kidney disease. The patient is presenting with complaint of chest pain located centrally (like pressure occurring on and off since last few days and worsening today. The pain was severe and the patient has taken some nitroglycerin which improved his pain. Pain is associated with shortness of breath. He denies any fever, chills, headache, dizziness, nausea, vomiting, abdominal pain, diarrhea, burning urination, acid reflux, constipation, leg swelling, recent travel or surgery or hospitalization. He also denies any recent change in his medication. he denies being on any scheduled nitrates. He mentions this pain is less severe than his recent admission in January.  The patient is coming from home. And at his baseline independent for most of his ADL.  Review of Systems: as mentioned in the history of present illness.  A comprehensive review of the other systems is negative.  Past Medical History  Diagnosis Date  . Hypertension   . Anxiety   . COPD (chronic obstructive pulmonary disease)   . High cholesterol   . GERD (gastroesophageal reflux disease)   . Daily headache   . Chronic kidney disease (CKD), stage III (moderate)   . MI (myocardial infarction)    Past Surgical History  Procedure Laterality Date  . No past surgeries    . Cardiac catheterization      MC x 1 stent  . Coronary angioplasty    . Left heart catheterization with coronary angiogram N/A 12/12/2013    Procedure: LEFT HEART CATHETERIZATION WITH CORONARY ANGIOGRAM;  Surgeon: Wellington Hampshire, MD;  Location: [facility];  Service: Cardiovascular;   Laterality: N/A;   Social History:  reports that he quit smoking about 4 years ago. His smoking use included Cigarettes. He has a 144 pack-year smoking history. He has never used smokeless tobacco. He reports that he drinks about 3.6 oz of alcohol per week. He reports that he does not use illicit drugs.  Allergies  Allergen Reactions  . Benzodiazepines Anaphylaxis and Shortness Of Breath  . Paroxetine Hcl Shortness Of Breath and Palpitations  . Serotonin Reuptake Inhibitors (Ssris) Shortness Of Breath and Palpitations  . Diazepam Other (See Comments)    Rapid heartrate  . Doxycycline Monohydrate Other (See Comments)    Unknown allergic reaction  . Escitalopram Oxalate Other (See Comments)    serotonin Syndrome  . Lorazepam Other (See Comments)    Unknown allergic reaction  . Tetracyclines & Related Itching and Rash    Family History  Problem Relation Age of Onset  . Coronary artery disease Brother   . Heart disease Brother 35  . Heart disease Father     MI x 8    Prior to Admission medications   Medication Sig Start Date End Date Taking? Authorizing Provider  acetaminophen (TYLENOL) 500 MG tablet Take 1,000 mg by mouth 2 (two) times daily as needed for headache.   Yes Historical Provider, MD  albuterol (PROVENTIL HFA;VENTOLIN HFA) 108 (90 BASE) MCG/ACT inhaler Inhale 1-2 puffs into the lungs every 6 (six) hours as needed for wheezing or shortness of breath.   Yes Historical Provider, MD  ALPRAZolam (XANAX XR) 3 MG 24 hr tablet Take 3 mg  by mouth every evening. For anxiety control   Yes Historical Provider, MD  ALPRAZolam Duanne Moron) 1 MG tablet Take 1 mg by mouth daily.  11/21/13  Yes Historical Provider, MD  aspirin EC 81 MG EC tablet Take 1 tablet (81 mg total) by mouth daily. 12/14/13  Yes Karlene Einstein, MD  budesonide-formoterol [facility]) 160-4.5 MCG/ACT inhaler Inhale 2 puffs into the lungs 2 (two) times daily.   Yes Historical Provider, MD  fenofibrate (TRICOR) 145 MG tablet  Take 145 mg by mouth every evening.    Yes Historical Provider, MD  Menthol-Methyl Salicylate (MUSCLE RUB EX) Apply 1 application topically daily as needed (pain).  06/03/14  Yes Historical Provider, MD  metoprolol succinate (TOPROL-XL) 25 MG 24 hr tablet TAKE ONE-HALF TABLET BY MOUTH DAILY 09/23/14  Yes Minna Merritts, MD  nitroGLYCERIN (NITROSTAT) 0.4 MG SL tablet Place 1 tablet (0.4 mg total) under the tongue every 5 (five) minutes as needed for chest pain. Call 911 after 3rd tablet 10/14/14  Yes Minna Merritts, MD  omeprazole (PRILOSEC) 40 MG capsule Take 40 mg by mouth daily.   Yes Historical Provider, MD  pravastatin (PRAVACHOL) 80 MG tablet Take 1 tablet (80 mg total) by mouth at bedtime. 12/20/13  Yes Minna Merritts, MD  ticagrelor (BRILINTA) 90 MG TABS tablet Take 1 tablet (90 mg total) by mouth 2 (two) times daily. 07/22/14  Yes Minna Merritts, MD  tiotropium (SPIRIVA) 18 MCG inhalation capsule Place 18 mcg into inhaler and inhale daily.   Yes Historical Provider, MD    Physical Exam: Filed Vitals:   11/22/14 2300 11/22/14 2315 11/22/14 2345 11/23/14 0032  BP: 128/77 129/83 111/76 122/89  Pulse: 74 73 75 79  Temp:    98.6 F (37 C)  TempSrc:    Oral  Resp: 16 17 17 18   Height:    5\' 9"  (1.753 m)  Weight:    81.239 kg (179 lb 1.6 oz)  SpO2: 96% 95% 96% 99%    General: Alert, Awake and Oriented to Time, Place and Person. Appear in mild distress Eyes: PERRL ENT: Oral Mucosa clear moist. Neck: no JVD Cardiovascular: S1 and S2 Present, no Murmur, Peripheral Pulses Present Respiratory: Bilateral Air entry equal and Decreased,  Clear to Auscultation, no Crackles, no wheezes Abdomen: Bowel Sound present, Soft and non tender Skin: no Rash Extremities: no Pedal edema, no calf tenderness Neurologic: Grossly no focal neuro deficit.  Labs on Admission:  CBC:  Recent Labs Lab 11/22/14 2128  WBC 6.6  NEUTROABS 3.2  HGB 14.3  HCT 40.5  MCV 86.0  PLT 307    CMP       Component Value Date/Time   NA 137 11/22/2014 2128   NA 138 08/22/2013 0115   K 3.6 11/22/2014 2128   K 3.9 08/22/2013 0115   CL 102 11/22/2014 2128   CL 106 08/22/2013 0115   CO2 27 11/22/2014 2128   CO2 27 08/22/2013 0115   GLUCOSE 105* 11/22/2014 2128   GLUCOSE 84 08/22/2013 0115   BUN 13 11/22/2014 2128   BUN 14 08/22/2013 0115   CREATININE 1.90* 11/22/2014 2128   CREATININE 2.11* 08/22/2013 0115   CALCIUM 9.6 11/22/2014 2128   CALCIUM 9.0 08/22/2013 0115   PROT 7.3 04/14/2014 2020   PROT 7.0 02/27/2013 1256   ALBUMIN 4.1 04/14/2014 2020   ALBUMIN 4.0 02/27/2013 1256   AST 29 04/14/2014 2020   AST 25 02/27/2013 1256   ALT 31 04/14/2014 2020  ALT 26 02/27/2013 1256   ALKPHOS 64 04/14/2014 2020   ALKPHOS 98 02/27/2013 1256   BILITOT 0.4 04/14/2014 2020   GFRNONAA 35* 11/22/2014 2128   GFRNONAA 32* 08/22/2013 0115   GFRAA 41* 11/22/2014 2128   GFRAA 37* 08/22/2013 0115    No results for input(s): LIPASE, AMYLASE in the last 168 hours.   Recent Labs Lab 11/22/14 2128  TROPONINI <0.03   BNP (last 3 results)  Recent Labs  06/08/14 1710 11/22/14 2120  BNP 23.8 10.6    ProBNP (last 3 results)  Recent Labs  02/26/14 1550  PROBNP 131.9*     Radiological Exams on Admission: Dg Chest 2 View  11/22/2014   CLINICAL DATA:  Chest pain for 1 week.  EXAM: CHEST  2 VIEW  COMPARISON:  06/08/2014  FINDINGS: Hyperinflation likely due to emphysema. The heart size and mediastinal contours are within normal limits. Both lungs are clear. The visualized skeletal structures are unremarkable. Coronary artery stent.  IMPRESSION: Emphysematous changes in the lungs. No active cardiopulmonary disease.   Electronically Signed   By: Lucienne Capers M.D.   On: 11/22/2014 21:32   EKG: Independently reviewed. normal sinus rhythm, nonspecific ST and T waves changes.  Assessment/Plan Principal Problem:   Chest pain at rest Active Problems:   HTN (hypertension)    Hyperlipidemia   COPD (chronic obstructive pulmonary disease)   Coronary atherosclerosis of native coronary artery   CKD (chronic kidney disease) stage 3, GFR 30-59 ml/min   GERD (gastroesophageal reflux disease)   1. Chest pain at rest The patient is presenting with complains of chest pain currently he is chest pain-free. He is EKG has nonspecific ST-T wave changes. Initial troponins are negative. With this the patient will be admitted in telemetry. Currently we will continue aspirin and ticagrelor.  Patient remains nothing by mouth except medication. Follow serial troponin. If elevated, cardiology needs to be consulted to discuss further plan. Otherwise cardiology in consultation in morning. I will add Imdur to his regimen. Continue metoprolol.  2. COPD. Continue home inhalers. Patient tolerating ticagrelor without any complication.  3. GERD. Continuing PPI.  4. History of anxiety. Patient is using high-dose Xanax at home. We will switch it to as needed at present.  5. Chronic kidney disease. Continue close monitoring at present. Avoid nephrotoxic medications.  Advance goals of care discussion: Full code   DVT Prophylaxis: subcutaneous Heparin Nutrition: Nothing by mouth except medications  Disposition: Admitted as observation, telemetry unit.  Author: Berle Mull, MD [facility] Pager: [phone] 11/23/2014  If 7PM-7AM, please contact night-coverage [email or web address] Password [email or web address]

## 2014-11-23 NOTE — Discharge Summary (Signed)
Physician Discharge Summary  William Mueller QPR:[id number] DOB: Jan 30, 1951 DOA: 11/22/2014  PCP: Leonides Sake, MD  Admit date: 11/22/2014 Discharge date: 11/23/2014  Time spent: 50 minutes  1.   Discharge Condition: stable Diet recommendation: low sodium, heart healthy  Discharge Diagnoses:  Principal Problem:   Chest pain at rest Active Problems:   HTN (hypertension)   Hyperlipidemia   COPD (chronic obstructive pulmonary disease)   Coronary atherosclerosis of native coronary artery   CKD (chronic kidney disease) stage 3, GFR 30-59 ml/min   GERD (gastroesophageal reflux disease)   History of present illness:  64 y/o male with h/o CAD s/p stent to the RCA in 7/15, COPD, HTN, CKD admitted with chest pain- pressure like on and off for a few days.   Hospital Course:  Chest pain - negative Troponin- EKG unrevealing - Nuclear stress test in 1/16- low risk - cardiology has evaluated and added Imdur and increased Metorpolol from 12.5 to 25 mg daily - per cardilogy, would be reasonable to stop Brilinta in July   COPD - stable  Consultations:  cardiology  Discharge Exam: Filed Weights   11/23/14 0032  Weight: 81.239 kg (179 lb 1.6 oz)   Filed Vitals:   11/23/14 0932  BP: 120/80  Pulse: 75  Temp:   Resp:     General: AAO x 3, no distress Cardiovascular: RRR, no murmurs  Respiratory: clear to auscultation bilaterally GI: soft, non-tender, non-distended, bowel sound positive  Discharge Instructions You were cared for by a hospitalist during your hospital stay. If you have any questions about your discharge medications or the care you received while you were in the hospital after you are discharged, you can call the unit and asked to speak with the hospitalist on call if the hospitalist that took care of you is not available. Once you are discharged, your primary care physician will handle any further medical issues. Please note that NO REFILLS for any discharge  medications will be authorized once you are discharged, as it is imperative that you return to your primary care physician (or establish a relationship with a primary care physician if you do not have one) for your aftercare needs so that they can reassess your need for medications and monitor your lab values.  Discharge Instructions    Diet - low sodium heart healthy    Complete by:  As directed      Increase activity slowly    Complete by:  As directed             Medication List    TAKE these medications        acetaminophen 500 MG tablet  Commonly known as:  TYLENOL  Take 1,000 mg by mouth 2 (two) times daily as needed for headache.     albuterol 108 (90 BASE) MCG/ACT inhaler  Commonly known as:  PROVENTIL HFA;VENTOLIN HFA  Inhale 1-2 puffs into the lungs every 6 (six) hours as needed for wheezing or shortness of breath.     ALPRAZolam 3 MG 24 hr tablet  Commonly known as:  XANAX XR  Take 3 mg by mouth every evening. For anxiety control     ALPRAZolam 1 MG tablet  Commonly known as:  XANAX  Take 1 mg by mouth daily.     aspirin 81 MG EC tablet  Take 1 tablet (81 mg total) by mouth daily.     budesonide-formoterol 160-4.5 MCG/ACT inhaler  Commonly known as:  SYMBICORT  Inhale 2 puffs into  the lungs 2 (two) times daily.     fenofibrate 145 MG tablet  Commonly known as:  TRICOR  Take 145 mg by mouth every evening.     isosorbide mononitrate 30 MG 24 hr tablet  Commonly known as:  IMDUR  Take 1 tablet (30 mg total) by mouth daily.     metoprolol succinate 25 MG 24 hr tablet  Commonly known as:  TOPROL-XL  Take 1 tablet (25 mg total) by mouth daily.     MUSCLE RUB EX  Apply 1 application topically daily as needed (pain).     nitroGLYCERIN 0.4 MG SL tablet  Commonly known as:  NITROSTAT  Place 1 tablet (0.4 mg total) under the tongue every 5 (five) minutes as needed for chest pain. Call 911 after 3rd tablet     omeprazole 40 MG capsule  Commonly known as:   PRILOSEC  Take 40 mg by mouth daily.     pravastatin 80 MG tablet  Commonly known as:  PRAVACHOL  Take 1 tablet (80 mg total) by mouth at bedtime.     ticagrelor 90 MG Tabs tablet  Commonly known as:  BRILINTA  Take 1 tablet (90 mg total) by mouth 2 (two) times daily.     tiotropium 18 MCG inhalation capsule  Commonly known as:  SPIRIVA  Place 18 mcg into inhaler and inhale daily.       Allergies  Allergen Reactions  . Benzodiazepines Anaphylaxis and Shortness Of Breath  . Paroxetine Hcl Shortness Of Breath and Palpitations  . Serotonin Reuptake Inhibitors (Ssris) Shortness Of Breath and Palpitations  . Diazepam Other (See Comments)    Rapid heartrate  . Doxycycline Monohydrate Other (See Comments)    Unknown allergic reaction  . Escitalopram Oxalate Other (See Comments)    serotonin Syndrome  . Lorazepam Other (See Comments)    Unknown allergic reaction  . Tetracyclines & Related Itching and Rash      The results of significant diagnostics from this hospitalization (including imaging, microbiology, ancillary and laboratory) are listed below for reference.    Significant Diagnostic Studies: Dg Chest 2 View  11/22/2014   CLINICAL DATA:  Chest pain for 1 week.  EXAM: CHEST  2 VIEW  COMPARISON:  06/08/2014  FINDINGS: Hyperinflation likely due to emphysema. The heart size and mediastinal contours are within normal limits. Both lungs are clear. The visualized skeletal structures are unremarkable. Coronary artery stent.  IMPRESSION: Emphysematous changes in the lungs. No active cardiopulmonary disease.   Electronically Signed   By: Lucienne Capers M.D.   On: 11/22/2014 21:32    Microbiology: No results found for this or any previous visit (from the past 240 hour(s)).   Labs: Basic Metabolic Panel:  Recent Labs Lab 11/22/14 2128 11/23/14 0300  NA 137 138  K 3.6 3.9  CL 102 102  CO2 27 27  GLUCOSE 105* 99  BUN 13 13  CREATININE 1.90* 1.92*  CALCIUM 9.6 9.1    Liver Function Tests:  Recent Labs Lab 11/23/14 0300  AST 26  ALT 26  ALKPHOS 53  BILITOT 0.7  PROT 6.6  ALBUMIN 3.7   No results for input(s): LIPASE, AMYLASE in the last 168 hours. No results for input(s): AMMONIA in the last 168 hours. CBC:  Recent Labs Lab 11/22/14 2128 11/23/14 0300  WBC 6.6 7.2  NEUTROABS 3.2 3.5  HGB 14.3 13.2  HCT 40.5 38.7*  MCV 86.0 86.8  PLT 307 281   Cardiac Enzymes:  Recent Labs  Lab 11/22/14 2128 11/23/14 0300  TROPONINI <0.03 <0.03   BNP: BNP (last 3 results)  Recent Labs  06/08/14 1710 11/22/14 2120  BNP 23.8 10.6    ProBNP (last 3 results)  Recent Labs  02/26/14 1550  PROBNP 131.9*    CBG: No results for input(s): GLUCAP in the last 168 hours.     SignedDebbe Odea, MD [facility] 11/23/2014, 5:22 PM

## 2014-11-25 ENCOUNTER — Other Ambulatory Visit: Payer: Self-pay | Admitting: *Deleted

## 2014-11-25 MED ORDER — TICAGRELOR 90 MG PO TABS: 90.0000 mg | ORAL_TABLET | Freq: Two times a day (BID) | ORAL | Status: DC

## 2014-12-16 ENCOUNTER — Ambulatory Visit (INDEPENDENT_AMBULATORY_CARE_PROVIDER_SITE_OTHER): Payer: PPO | Admitting: Cardiovascular Disease

## 2014-12-16 ENCOUNTER — Encounter: Payer: Self-pay | Admitting: Cardiovascular Disease

## 2014-12-16 VITALS — BP 134/82 | HR 88 | Ht 69.0 in | Wt 182.2 lb

## 2014-12-16 DIAGNOSIS — I10 Essential (primary) hypertension: Secondary | ICD-10-CM

## 2014-12-16 DIAGNOSIS — R079 Chest pain, unspecified: Secondary | ICD-10-CM | POA: Diagnosis not present

## 2014-12-16 DIAGNOSIS — J432 Centrilobular emphysema: Secondary | ICD-10-CM

## 2014-12-16 DIAGNOSIS — I2510 Atherosclerotic heart disease of native coronary artery without angina pectoris: Secondary | ICD-10-CM

## 2014-12-16 DIAGNOSIS — I200 Unstable angina: Secondary | ICD-10-CM | POA: Diagnosis not present

## 2014-12-16 DIAGNOSIS — N183 Chronic kidney disease, stage 3 unspecified: Secondary | ICD-10-CM

## 2014-12-16 DIAGNOSIS — E785 Hyperlipidemia, unspecified: Secondary | ICD-10-CM

## 2014-12-16 MED ORDER — NITROGLYCERIN 0.4 MG SL SUBL: 0.4000 mg | SUBLINGUAL_TABLET | SUBLINGUAL | Status: DC | PRN

## 2014-12-16 MED ORDER — TICAGRELOR 60 MG PO TABS: 60.0000 mg | ORAL_TABLET | Freq: Two times a day (BID) | ORAL | Status: DC

## 2014-12-16 MED ORDER — OMEPRAZOLE 40 MG PO CPDR
40.0000 mg | DELAYED_RELEASE_CAPSULE | Freq: Every day | ORAL | Status: DC
Start: 2014-12-16 — End: 2015-09-04

## 2014-12-16 NOTE — Assessment & Plan Note (Signed)
Blood pressure is well controlled on today's visit. No changes made to the medications. 

## 2014-12-16 NOTE — Assessment & Plan Note (Signed)
Currently with no symptoms of angina. No further workup at this time. Continue current medication regimen. 

## 2014-12-16 NOTE — Assessment & Plan Note (Signed)
Chronic shortness of breath. He smoked 45 years, now using vapor cigarettes for the past 5 years

## 2014-12-16 NOTE — Patient Instructions (Addendum)
You are doing well.  Early in 2017, We will change to crestor Cholesterol is a little high  Please decrease the brilinta down to 60 mg twice a day Stay on aspirin 81 mg daily  Please call us if you have new issues that need to be addressed before your next appt.  Your physician wants you to follow-up in: 6 months.  You will receive a reminder letter in the mail two months in advance. If you don't receive a letter, please call our office to schedule the follow-up appointment.

## 2014-12-16 NOTE — Assessment & Plan Note (Signed)
Baseline creatinine 1.9

## 2014-12-16 NOTE — Progress Notes (Signed)
Patient ID: William Mueller, male    DOB: [birth year], 64 y.o.   MRN: [id number]  HPI Comments:  William Mueller is a very pleasant 64 year old gentleman with a long history of smoking, COPD, chronic renal insufficiency, creatinine 1.9,  who presented 12/11/2013 to [place] with chest pain. He was taken to the cardiac catheterization lab where he was found to have an occluded mid RCA. Stent was placed. He presents today for follow-up of his coronary artery disease  In follow-up, he reports that he went to the emergency room 2-3 weeks ago for chest pain. He was given isosorbide after ruling out for MI. He was unable to tolerate this medication secondary to headaches Reports blood pressure has been relatively stable 001 up to 749 systolic LDL is 99. Rarely takes nitroglycerin for chest discomfort. Relatively sedentary at baseline, no regular exercise program. He has underlying COPD, worsening shortness of breath in the hot weather Recent blood work showing creatinine 1. 9 Tolerating pravastatin 80 mg daily  EKG on today's visit shows normal sinus rhythm with rate 88 bpm, no significant ST or T-wave changes  Other past medical history he presented to the hospital 02/26/2014 with chest pain. He seen by cardiology, rule out for MI, had normal EKG and discharged home. Echocardiogram July 2015 showed ejection fraction 55-60%  Creatinine in 10/22/2013 was 2.1  Cardiac catheterization 12/12/2013 Left Main:  Normal Left Anterior Descending (LAD):  Normal in size and mildly calcified. There is diffuse 20% disease throughout the midsegment. The rest of the vessel has minor irregularities. 1st diagonal (D1):  Normal in size with 50% ostial stenosis and 50% proximal stenosis. 2nd diagonal (D2):  Large in size with 30% proximal stenosis. 3rd diagonal (D3):  Very small in size. Circumflex (LCx):  Normal in size and nondominant. The vessel is moderately calcified with diffuse 20% disease in the  midsegment. 1st obtuse marginal:  Very small in size. 2nd obtuse marginal:  Normal in size with no significant disease. 3rd obtuse marginal:  Medium in size with no significant disease.           AV groove continuation segment: Small in size and subtotally occluded proximally.     Allergies  Allergen Reactions  . Benzodiazepines Anaphylaxis and Shortness Of Breath  . Paroxetine Hcl Shortness Of Breath and Palpitations  . Serotonin Reuptake Inhibitors (Ssris) Shortness Of Breath and Palpitations  . Diazepam Other (See Comments)    Rapid heartrate  . Doxycycline Monohydrate Other (See Comments)    Unknown allergic reaction  . Escitalopram Oxalate Other (See Comments)    serotonin Syndrome  . Isosorbide     Feels like head was going to explode  . Lorazepam Other (See Comments)    Unknown allergic reaction  . Tetracyclines & Related Itching and Rash    Outpatient Encounter Prescriptions as of 12/16/2014  Medication Sig  . acetaminophen (TYLENOL) 500 MG tablet Take 1,000 mg by mouth 2 (two) times daily as needed for headache.  . albuterol (PROVENTIL HFA;VENTOLIN HFA) 108 (90 BASE) MCG/ACT inhaler Inhale 1-2 puffs into the lungs every 6 (six) hours as needed for wheezing or shortness of breath.  . ALPRAZolam (XANAX XR) 3 MG 24 hr tablet Take 3 mg by mouth every evening. For anxiety control  . ALPRAZolam (XANAX) 1 MG tablet Take 1 mg by mouth daily.   Marland Kitchen aspirin EC 81 MG EC tablet Take 1 tablet (81 mg total) by mouth daily.  . budesonide-formoterol (SYMBICORT) 160-4.5 MCG/ACT  inhaler Inhale 2 puffs into the lungs 2 (two) times daily.  . Menthol-Methyl Salicylate (MUSCLE RUB EX) Apply 1 application topically daily as needed (pain).   . metoprolol succinate (TOPROL-XL) 25 MG 24 hr tablet Take 1 tablet (25 mg total) by mouth daily.  . nitroGLYCERIN (NITROSTAT) 0.4 MG SL tablet Place 1 tablet (0.4 mg total) under the tongue every 5 (five) minutes as needed for chest pain. Call 911 after 3rd  tablet  . omeprazole (PRILOSEC) 40 MG capsule Take 1 capsule (40 mg total) by mouth daily.  . pravastatin (PRAVACHOL) 80 MG tablet Take 1 tablet (80 mg total) by mouth at bedtime.  . roflumilast (DALIRESP) 500 MCG TABS tablet Take 500 mcg by mouth daily.  . ticagrelor (BRILINTA) 60 MG TABS tablet Take 1 tablet (60 mg total) by mouth 2 (two) times daily.  Marland Kitchen tiotropium (SPIRIVA) 18 MCG inhalation capsule Place 18 mcg into inhaler and inhale daily.  . [DISCONTINUED] nitroGLYCERIN (NITROSTAT) 0.4 MG SL tablet Place 1 tablet (0.4 mg total) under the tongue every 5 (five) minutes as needed for chest pain. Call 911 after 3rd tablet  . [DISCONTINUED] omeprazole (PRILOSEC) 40 MG capsule Take 40 mg by mouth daily.  . [DISCONTINUED] ticagrelor (BRILINTA) 90 MG TABS tablet Take 1 tablet (90 mg total) by mouth 2 (two) times daily.  . [DISCONTINUED] fenofibrate (TRICOR) 145 MG tablet Take 145 mg by mouth every evening.   . [DISCONTINUED] isosorbide mononitrate (IMDUR) 30 MG 24 hr tablet Take 1 tablet (30 mg total) by mouth daily. (Patient not taking: Reported on 12/16/2014)   No facility-administered encounter medications on file as of 12/16/2014.    Past Medical History  Diagnosis Date  . Hypertension   . Anxiety   . COPD (chronic obstructive pulmonary disease)   . High cholesterol   . GERD (gastroesophageal reflux disease)   . Daily headache   . Chronic kidney disease (CKD), stage III (moderate)   . MI (myocardial infarction)     Past Surgical History  Procedure Laterality Date  . No past surgeries    . Cardiac catheterization      MC x 1 stent  . Coronary angioplasty    . Left heart catheterization with coronary angiogram N/A 12/12/2013    Procedure: LEFT HEART CATHETERIZATION WITH CORONARY ANGIOGRAM;  Surgeon: Wellington Hampshire, MD;  Location: [facility];  Service: Cardiovascular;  Laterality: N/A;    Social History  reports that he has been smoking Cigarettes and E-cigarettes.  He has a 144  pack-year smoking history. He has never used smokeless tobacco. He reports that he drinks about 3.6 oz of alcohol per week. He reports that he does not use illicit drugs.  Family History family history includes Coronary artery disease in his brother; Heart disease in his father; Heart disease (age of onset: 58) in his brother.   Review of Systems  Constitutional: Negative.   Respiratory: Negative.   Cardiovascular: Positive for chest pain.  Gastrointestinal: Negative.   Musculoskeletal: Negative.   Skin: Negative.   Neurological: Negative.   Hematological: Negative.   Psychiatric/Behavioral: The patient is nervous/anxious.   All other systems reviewed and are negative.   BP 134/82 mmHg  Pulse 88  Ht 5\' 9"  (1.753 m)  Wt 182 lb 4 oz (82.668 kg)  BMI 26.90 kg/m2  Physical Exam  Constitutional: He is oriented to person, place, and time. He appears well-developed and well-nourished.  HENT:  Head: Normocephalic.  Nose: Nose normal.  Mouth/Throat: Oropharynx is  clear and moist.  Eyes: Conjunctivae are normal. Pupils are equal, round, and reactive to light.  Neck: Normal range of motion. Neck supple. No JVD present.  Cardiovascular: Normal rate, regular rhythm, S1 normal, S2 normal, normal heart sounds and intact distal pulses.  Exam reveals no gallop and no friction rub.   No murmur heard. Pulmonary/Chest: Effort normal and breath sounds normal. No respiratory distress. He has no wheezes. He has no rales. He exhibits no tenderness.  Abdominal: Soft. Bowel sounds are normal. He exhibits no distension. There is no tenderness.  Musculoskeletal: Normal range of motion. He exhibits no edema or tenderness.  Lymphadenopathy:    He has no cervical adenopathy.  Neurological: He is alert and oriented to person, place, and time. Coordination normal.  Skin: Skin is warm and dry. No rash noted. No erythema.  Psychiatric: He has a normal mood and affect. His behavior is normal. Judgment and  thought content normal.      Assessment and Plan   Nursing note and vitals reviewed.

## 2014-12-16 NOTE — Assessment & Plan Note (Signed)
Cholesterol not at goal. LDL is 99, goal less than 70 Recommended we change him to Crestor 40 mg daily when this goes generic pricing early 2017 May need to add zetia

## 2014-12-16 NOTE — Assessment & Plan Note (Signed)
Recommended he take nitroglycerin sublingual for chest pain

## 2015-01-20 ENCOUNTER — Other Ambulatory Visit: Payer: Self-pay | Admitting: Cardiovascular Disease

## 2015-02-18 ENCOUNTER — Telehealth: Payer: Self-pay | Admitting: *Deleted

## 2015-02-18 NOTE — Telephone Encounter (Signed)
Pt c/o BP issue: STAT if pt c/o blurred vision, one-sided weakness or slurred speech  1. What are your last 5 BP readings? 02/17/15 am 110/77 hr 102, pm 140/91 hr 100, 02/18/15 am 103/73 hr   2. Are you having any other symptoms (ex. Dizziness, headache, blurred vision, passed out)? Bad headache, feels week, low back pain, chest pain.  3. What is your BP issue? See about

## 2015-02-18 NOTE — Telephone Encounter (Signed)
Spoke w/ pt's wife.  She reports that pt has had chest pain, headache, lower back pain and "his BP has been going crazy" for the past few days. Reports that pt has been under a great deal of stress for the past week and she believes this is contributing to sx.  She is unable to describe pt's pain, but reports that he has not experienced n/v, SOB or been sweating.  Pt has not taken nitro, but did take and extra 1/2 metoprolol last night. She states that pt has laid down and she does not want to wake him to ask him any questions.  She states that he was advised previously by Dr. Rockey Situ to lie down when he feels this way. Reports that he will sleep for about an hour and feel better.  Advised her to keep me posted on pt's condition, to give him nitro if cp returns and call 911 or take him to ED of sx become emergent.  She verbalizes understanding and is agreeable.

## 2015-03-01 ENCOUNTER — Observation Stay
Admission: EM | Admit: 2015-03-01 | Discharge: 2015-03-02 | Disposition: A | Discharge status: Home / Self Care | Payer: PPO | Attending: Internal Medicine | Admitting: Internal Medicine

## 2015-03-01 ENCOUNTER — Emergency Department: Payer: PPO

## 2015-03-01 ENCOUNTER — Encounter: Payer: Self-pay | Admitting: Emergency Medicine

## 2015-03-01 DIAGNOSIS — E780 Pure hypercholesterolemia: Secondary | ICD-10-CM | POA: Insufficient documentation

## 2015-03-01 DIAGNOSIS — F17200 Nicotine dependence, unspecified, uncomplicated: Secondary | ICD-10-CM | POA: Diagnosis not present

## 2015-03-01 DIAGNOSIS — Z7982 Long term (current) use of aspirin: Secondary | ICD-10-CM | POA: Insufficient documentation

## 2015-03-01 DIAGNOSIS — F329 Major depressive disorder, single episode, unspecified: Secondary | ICD-10-CM | POA: Diagnosis not present

## 2015-03-01 DIAGNOSIS — E785 Hyperlipidemia, unspecified: Secondary | ICD-10-CM | POA: Diagnosis not present

## 2015-03-01 DIAGNOSIS — Z888 Allergy status to other drugs, medicaments and biological substances status: Secondary | ICD-10-CM | POA: Insufficient documentation

## 2015-03-01 DIAGNOSIS — Z9889 Other specified postprocedural states: Secondary | ICD-10-CM | POA: Diagnosis not present

## 2015-03-01 DIAGNOSIS — R079 Chest pain, unspecified: Principal | ICD-10-CM | POA: Insufficient documentation

## 2015-03-01 DIAGNOSIS — I252 Old myocardial infarction: Secondary | ICD-10-CM | POA: Diagnosis not present

## 2015-03-01 DIAGNOSIS — I2510 Atherosclerotic heart disease of native coronary artery without angina pectoris: Secondary | ICD-10-CM | POA: Insufficient documentation

## 2015-03-01 DIAGNOSIS — I129 Hypertensive chronic kidney disease with stage 1 through stage 4 chronic kidney disease, or unspecified chronic kidney disease: Secondary | ICD-10-CM | POA: Insufficient documentation

## 2015-03-01 DIAGNOSIS — F419 Anxiety disorder, unspecified: Secondary | ICD-10-CM | POA: Diagnosis not present

## 2015-03-01 DIAGNOSIS — N183 Chronic kidney disease, stage 3 (moderate): Secondary | ICD-10-CM | POA: Diagnosis not present

## 2015-03-01 DIAGNOSIS — J449 Chronic obstructive pulmonary disease, unspecified: Secondary | ICD-10-CM | POA: Insufficient documentation

## 2015-03-01 DIAGNOSIS — Z79899 Other long term (current) drug therapy: Secondary | ICD-10-CM | POA: Insufficient documentation

## 2015-03-01 DIAGNOSIS — Z881 Allergy status to other antibiotic agents status: Secondary | ICD-10-CM | POA: Insufficient documentation

## 2015-03-01 DIAGNOSIS — R0789 Other chest pain: Secondary | ICD-10-CM | POA: Diagnosis present

## 2015-03-01 DIAGNOSIS — Z8249 Family history of ischemic heart disease and other diseases of the circulatory system: Secondary | ICD-10-CM | POA: Diagnosis not present

## 2015-03-01 DIAGNOSIS — K219 Gastro-esophageal reflux disease without esophagitis: Secondary | ICD-10-CM | POA: Insufficient documentation

## 2015-03-01 DIAGNOSIS — Z955 Presence of coronary angioplasty implant and graft: Secondary | ICD-10-CM | POA: Insufficient documentation

## 2015-03-01 HISTORY — DX: Angina pectoris, unspecified: I20.9

## 2015-03-01 HISTORY — DX: Atherosclerotic heart disease of native coronary artery without angina pectoris: I25.10

## 2015-03-01 HISTORY — DX: Major depressive disorder, single episode, unspecified: F32.9

## 2015-03-01 HISTORY — DX: Depression, unspecified: F32.A

## 2015-03-01 LAB — BASIC METABOLIC PANEL
ANION GAP: 9 (ref 5–15)
BUN: 14 mg/dL (ref 6–20)
CHLORIDE: 100 mmol/L — AB (ref 101–111)
CO2: 26 mmol/L (ref 22–32)
Calcium: 9.2 mg/dL (ref 8.9–10.3)
Creatinine, Ser: 1.61 mg/dL — ABNORMAL HIGH (ref 0.61–1.24)
GFR calc Af Amer: 50 mL/min — ABNORMAL LOW (ref >60)
GFR calc non Af Amer: 43 mL/min — ABNORMAL LOW (ref >60)
GLUCOSE: 101 mg/dL — AB (ref 65–99)
POTASSIUM: 4.2 mmol/L (ref 3.5–5.1)
Sodium: 135 mmol/L (ref 135–145)

## 2015-03-01 LAB — CBC
HEMATOCRIT: 41.2 % (ref 40.0–52.0)
HEMOGLOBIN: 14.6 g/dL (ref 13.0–18.0)
MCH: 30.5 pg (ref 26.0–34.0)
MCHC: 35.4 g/dL (ref 32.0–36.0)
MCV: 86.1 fL (ref 80.0–100.0)
Platelets: 267 10*3/uL (ref 150–440)
RBC: 4.78 MIL/uL (ref 4.40–5.90)
RDW: 12.4 % (ref 11.5–14.5)
WBC: 8.3 10*3/uL (ref 3.8–10.6)

## 2015-03-01 LAB — TROPONIN I
Troponin I: <0.03 ng/mL (ref <0.031)
Troponin I: <0.03 ng/mL (ref <0.031)
Troponin I: <0.03 ng/mL (ref <0.031)

## 2015-03-01 MED ORDER — ALPRAZOLAM 1 MG PO TABS
1.0000 mg | ORAL_TABLET | Freq: Every day | ORAL | Status: DC
  Administered 2015-03-02: 1 mg via ORAL
  Filled 2015-03-01: qty 1

## 2015-03-01 MED ORDER — PRAVASTATIN SODIUM 20 MG PO TABS
80.0000 mg | ORAL_TABLET | Freq: Every day | ORAL | Status: DC
  Administered 2015-03-01: 80 mg via ORAL
  Filled 2015-03-01 (×2): qty 4

## 2015-03-01 MED ORDER — TICAGRELOR 60 MG PO TABS
60.0000 mg | ORAL_TABLET | Freq: Two times a day (BID) | ORAL | Status: DC
  Administered 2015-03-02: 60 mg via ORAL
  Filled 2015-03-01: qty 1

## 2015-03-01 MED ORDER — HEPARIN SODIUM (PORCINE) 5000 UNIT/ML IJ SOLN
5000.0000 [IU] | Freq: Three times a day (TID) | INTRAMUSCULAR | Status: DC
  Administered 2015-03-01 – 2015-03-02 (×3): 5000 [IU] via SUBCUTANEOUS
  Filled 2015-03-01 (×3): qty 1

## 2015-03-01 MED ORDER — NITROGLYCERIN 0.4 MG SL SUBL: 0.4000 mg | SUBLINGUAL_TABLET | SUBLINGUAL | Status: DC | PRN

## 2015-03-01 MED ORDER — BUDESONIDE-FORMOTEROL FUMARATE 160-4.5 MCG/ACT IN AERO
2.0000 | INHALATION_SPRAY | Freq: Two times a day (BID) | RESPIRATORY_TRACT | Status: DC
  Administered 2015-03-01 – 2015-03-02 (×2): 2 via RESPIRATORY_TRACT
  Filled 2015-03-01: qty 6

## 2015-03-01 MED ORDER — TIOTROPIUM BROMIDE MONOHYDRATE 18 MCG IN CAPS
18.0000 ug | ORAL_CAPSULE | Freq: Every day | RESPIRATORY_TRACT | Status: DC
  Administered 2015-03-02: 18 ug via RESPIRATORY_TRACT
  Filled 2015-03-01: qty 5

## 2015-03-01 MED ORDER — ASPIRIN EC 325 MG PO TBEC
325.0000 mg | DELAYED_RELEASE_TABLET | Freq: Every day | ORAL | Status: DC
  Administered 2015-03-02: 325 mg via ORAL
  Filled 2015-03-01: qty 1

## 2015-03-01 MED ORDER — ASPIRIN 81 MG PO CHEW
324.0000 mg | CHEWABLE_TABLET | Freq: Once | ORAL | Status: AC
  Administered 2015-03-01: 324 mg via ORAL
  Filled 2015-03-01: qty 4

## 2015-03-01 MED ORDER — ALPRAZOLAM ER 1 MG PO TB24
3.0000 mg | ORAL_TABLET | Freq: Every evening | ORAL | Status: DC
  Administered 2015-03-01: 3 mg via ORAL
  Filled 2015-03-01: qty 3

## 2015-03-01 MED ORDER — PANTOPRAZOLE SODIUM 40 MG PO TBEC
40.0000 mg | DELAYED_RELEASE_TABLET | Freq: Every day | ORAL | Status: DC
  Administered 2015-03-02: 40 mg via ORAL
  Filled 2015-03-01: qty 1

## 2015-03-01 MED ORDER — ONDANSETRON HCL 4 MG/2ML IJ SOLN: 4.0000 mg | Freq: Four times a day (QID) | INTRAMUSCULAR | Status: DC | PRN

## 2015-03-01 MED ORDER — ROFLUMILAST 500 MCG PO TABS
500.0000 ug | ORAL_TABLET | Freq: Every day | ORAL | Status: DC
  Administered 2015-03-01 – 2015-03-02 (×2): 500 ug via ORAL
  Filled 2015-03-01 (×2): qty 1

## 2015-03-01 MED ORDER — METOPROLOL SUCCINATE ER 25 MG PO TB24
25.0000 mg | ORAL_TABLET | Freq: Every day | ORAL | Status: DC
  Administered 2015-03-02: 25 mg via ORAL
  Filled 2015-03-01: qty 1

## 2015-03-01 MED ORDER — ACETAMINOPHEN 325 MG PO TABS
650.0000 mg | ORAL_TABLET | ORAL | Status: DC | PRN
  Administered 2015-03-01 – 2015-03-02 (×2): 650 mg via ORAL
  Filled 2015-03-01 (×2): qty 2

## 2015-03-01 NOTE — ED Provider Notes (Signed)
[facility] Emergency Department Provider Note  ____________________________________________  Time seen: Approximately 11:25 AM  I have reviewed the triage vital signs and the nursing notes.   HISTORY  Chief Complaint Chest Pain    HPI William Mueller is a 64 y.o. male history of hypertension, GERD, chronic kidney disease, coronary artery disease status post RCA stent presents for evaluation of intermittent aching left chest pain radiating to the left arm, gradual onset yesterday, now gone. Patient reports that all day yesterday he had left-sided chest pain which was associated with some shortness of breath and nausea. He is very fatigued yesterday and unable to do much on the house. This morning he has also had recurrence of the chest pain and shortness of breath. He reports it feels different than his prior heart attack. Currently he has no pain. No modifying factors. He took an 81 mg aspirin today. He denies any recent illness including no cough, sneezing, runny nose, congestion, fevers or chills. He was started on sertraline 3 days ago and is concerned that this may be causing his symptoms.   Past Medical History  Diagnosis Date  . Hypertension   . Anxiety   . COPD (chronic obstructive pulmonary disease)   . High cholesterol   . GERD (gastroesophageal reflux disease)   . Daily headache   . Chronic kidney disease (CKD), stage III (moderate)   . MI (myocardial infarction)     Patient Active Problem List   Diagnosis Date Noted  . Chest pain at rest 11/23/2014  . GERD (gastroesophageal reflux disease) 06/08/2014  . CKD (chronic kidney disease) stage 3, GFR 30-59 ml/min 05/04/2014  . Bilateral leg pain 05/04/2014  . Lung nodule < 6cm on CT 05/04/2014  . Abnormal CT scan, kidney 05/04/2014  . Unstable angina 05/04/2014  . Chest pain 03/22/2014  . Atrial tachycardia, paroxysmal 12/14/2013  . Coronary atherosclerosis of native coronary artery 12/13/2013  .  NSTEMI (non-ST elevated myocardial infarction) 12/13/2013  . HTN (hypertension) 12/11/2013  . Acute renal failure superimposed on stage 3 chronic kidney disease 12/11/2013  . Hyperlipidemia 12/11/2013  . COPD (chronic obstructive pulmonary disease) 12/11/2013    Past Surgical History  Procedure Laterality Date  . No past surgeries    . Cardiac catheterization      MC x 1 stent  . Coronary angioplasty    . Left heart catheterization with coronary angiogram N/A 12/12/2013    Procedure: LEFT HEART CATHETERIZATION WITH CORONARY ANGIOGRAM;  Surgeon: Wellington Hampshire, MD;  Location: [facility];  Service: Cardiovascular;  Laterality: N/A;    Current Outpatient Rx  Name  Route  Sig  Dispense  Refill  . acetaminophen (TYLENOL) 500 MG tablet   Oral   Take 1,000 mg by mouth 2 (two) times daily as needed for headache.         . albuterol (PROVENTIL HFA;VENTOLIN HFA) 108 (90 BASE) MCG/ACT inhaler   Inhalation   Inhale 1-2 puffs into the lungs every 6 (six) hours as needed for wheezing or shortness of breath.         . ALPRAZolam (XANAX XR) 3 MG 24 hr tablet   Oral   Take 3 mg by mouth every evening. For anxiety control         . ALPRAZolam (XANAX) 1 MG tablet   Oral   Take 1 mg by mouth daily.          Marland Kitchen aspirin EC 81 MG EC tablet   Oral  Take 1 tablet (81 mg total) by mouth daily.   30 tablet   0   . budesonide-formoterol (SYMBICORT) 160-4.5 MCG/ACT inhaler   Inhalation   Inhale 2 puffs into the lungs 2 (two) times daily.         . Menthol-Methyl Salicylate (MUSCLE RUB EX)   Topical   Apply 1 application topically daily as needed (pain).          . metoprolol succinate (TOPROL-XL) 25 MG 24 hr tablet   Oral   Take 1 tablet (25 mg total) by mouth daily.   30 tablet   6   . nitroGLYCERIN (NITROSTAT) 0.4 MG SL tablet   Sublingual   Place 1 tablet (0.4 mg total) under the tongue every 5 (five) minutes as needed for chest pain. Call 911 after 3rd tablet   25  tablet   3   . omeprazole (PRILOSEC) 40 MG capsule   Oral   Take 1 capsule (40 mg total) by mouth daily.   30 capsule   11   . pravastatin (PRAVACHOL) 80 MG tablet      TAKE ONE TABLET BY MOUTH AT BEDTIME   30 tablet   3   . roflumilast (DALIRESP) 500 MCG TABS tablet   Oral   Take 500 mcg by mouth daily.         . ticagrelor (BRILINTA) 60 MG TABS tablet   Oral   Take 1 tablet (60 mg total) by mouth 2 (two) times daily.   60 tablet   11   . tiotropium (SPIRIVA) 18 MCG inhalation capsule   Inhalation   Place 18 mcg into inhaler and inhale daily.           Allergies Benzodiazepines; Paroxetine hcl; Serotonin reuptake inhibitors (ssris); Diazepam; Doxycycline monohydrate; Escitalopram oxalate; Isosorbide; Lorazepam; and Tetracyclines & related  Family History  Problem Relation Age of Onset  . Coronary artery disease Brother   . Heart disease Brother 36  . Heart disease Father     MI x 8    Social History Social History  Substance Use Topics  . Smoking status: Current Every Day Smoker -- 3.00 packs/day for 48 years    Types: Cigarettes, E-cigarettes    Last Attempt to Quit: 06/07/2010  . Smokeless tobacco: Never Used     Comment: Using electronic cigarette  . Alcohol Use: 3.6 oz/week    6 Cans of beer per week    Review of Systems Constitutional: No fever/chills Eyes: No visual changes. ENT: No sore throat. Cardiovascular: +chest pain. Respiratory: +shortness of breath. Gastrointestinal: No abdominal pain.  No nausea, no vomiting.  No diarrhea.  No constipation. Genitourinary: Negative for dysuria. Musculoskeletal: Negative for back pain. Skin: Negative for rash. Neurological: Negative for headaches, focal weakness or numbness.  10-point ROS otherwise negative.  ____________________________________________   PHYSICAL EXAM:  VITAL SIGNS: ED Triage Vitals  Enc Vitals Group     BP 03/01/15 1100 123/74 mmHg     Pulse Rate 03/01/15 1100 85      Resp 03/01/15 1100 20     Temp 03/01/15 1100 98.2 F (36.8 C)     Temp Source 03/01/15 1100 Oral     SpO2 03/01/15 1100 99 %     Weight 03/01/15 1100 178 lb (80.74 kg)     Height 03/01/15 1100 5\' 9"  (1.753 m)     Head Cir --      Peak Flow --      Pain Score 03/01/15  1101 0     Pain Loc --      Pain Edu? --      Excl. in [place]? --     Constitutional: Alert and oriented. Well appearing and in no acute distress. Eyes: Conjunctivae are normal. PERRL. EOMI. Head: Atraumatic. Nose: No congestion/rhinnorhea. Mouth/Throat: Mucous membranes are moist.  Oropharynx non-erythematous. Neck: No stridor.   Cardiovascular: Normal rate, regular rhythm. Grossly normal heart sounds.  Good peripheral circulation. Respiratory: Normal respiratory effort.  No retractions. Lungs CTAB. Gastrointestinal: Soft and nontender. No distention. No abdominal bruits. No CVA tenderness. Genitourinary: deferred Musculoskeletal: No lower extremity tenderness nor edema.  No joint effusions. No tenderness to palpation throat the left chest wall. Neurologic:  Normal speech and language. No gross focal neurologic deficits are appreciated.  Skin:  Skin is warm, dry and intact. No rash noted. Psychiatric: Mood and affect are normal. Speech and behavior are normal.  ____________________________________________   LABS (all labs ordered are listed, but only abnormal results are displayed)  Labs Reviewed  BASIC METABOLIC PANEL - Abnormal; Notable for the following:    Chloride 100 (*)    Glucose, Bld 101 (*)    Creatinine, Ser 1.61 (*)    GFR calc non Af Amer 43 (*)    GFR calc Af Amer 50 (*)    All other components within normal limits  CBC  TROPONIN I   ____________________________________________  EKG  ED ECG REPORT I, Joanne Gavel, the attending physician, personally viewed and interpreted this ECG.   Date: 03/01/2015  EKG Time: 10:55  Rate: 87  Rhythm: sinus rhythm with PACs  Axis: normal   Intervals:none  ST&T Change: No acute ST elevation. Q-wave in V2 and T-wave flattening in lead 1 and V6 are new when compared to EKG on 12/16/2014.  ____________________________________________  RADIOLOGY  CXR FINDINGS: Stable cardiac and mediastinal contours. No consolidative pulmonary opacities. No pleural effusion or pneumothorax. Regional skeleton is unremarkable.  IMPRESSION: No active cardiopulmonary disease.   ____________________________________________   PROCEDURES  Procedure(s) performed: None  Critical Care performed: No  ____________________________________________   INITIAL IMPRESSION / ASSESSMENT AND PLAN / ED COURSE  Pertinent labs & imaging results that were available during my care of the patient were reviewed by me and considered in my medical decision making (see chart for details).  William Mueller is a 64 y.o. male history of hypertension, GERD, chronic kidney disease, coronary artery disease status post RCA stent presents for evaluation of intermittent aching left chest pain radiating to the left arm, gradual onset yesterday, now gone. On exam, he is generally well-appearing and in no acute distress. Vital signs stable, he is afebrile. Labs reviewed and are notable for chronic creatinine elevation at 1.61. First troponin is negative however EKG with new Q-wave in V2, T-wave flattening in the lateral leads and compared to prior EKG. Concern for possible ACS. H&P not consistent  acute aortic dissection or pulmonary embolism. Aspirin given. Case discussed with Dr. Volanda Napoleon for admission at 12:16 pm/ ____________________________________________   FINAL CLINICAL IMPRESSION(S) / ED DIAGNOSES  Final diagnoses:  Chest pain, unspecified chest pain type      Joanne Gavel, MD 03/01/15 1216

## 2015-03-01 NOTE — ED Notes (Signed)
Patient presents to the ED with chest pain since yesterday evening.  Patient reports left chest wall tenderness on palpation and states pain radiates down his left arm.  Patient reports starting sertraline 3 days ago and feels it may be related.  Patient reports history of anxiety. Patient reports lack of appetite and shortness of breath.  Respirations are even and nonlabored at this time.   Patient is in no obvious distress at this time.

## 2015-03-01 NOTE — H&P (Signed)
[place] [facility] [place] NAME: William Mueller    MR#:  [id number]  DATE OF BIRTH:  01-23-1951  DATE OF ADMISSION:  03/01/2015  PRIMARY CARE PHYSICIAN: Leonides Sake, MD   REQUESTING/REFERRING PHYSICIAN: Dr Edd Fabian  CHIEF COMPLAINT:   Chief Complaint  Patient presents with  . Chest Pain    HISTORY OF PRESENT ILLNESS:  William Mueller  is a 64 y.o. male with a known history of coronary artery disease status post PCI with stent in RCA in 12/2013, COPD, CK D3, who presents today with 2-3 days of left-sided chest pain. Pain has been associated with nausea, shortness of breath no diaphoresis no vomiting. Pain is intermittent and he cannot identify any precipitating or alleviating factors. He describes a sharp shooting pain to the left chest which at times has also radiated down the left arm. He states that this feels very different from prior episodes of angina which were more of a pressure sensation and involve more of the chest. He feels that these symptoms may have been triggered by starting sertraline 50 mg 4 days ago. On presentation he has nonspecific T-wave changes, initial troponin is negative. He is being admitted for ACS rule out.  PAST MEDICAL HISTORY:   Past Medical History  Diagnosis Date  . Hypertension   . Anxiety   . COPD (chronic obstructive pulmonary disease)   . High cholesterol   . GERD (gastroesophageal reflux disease)   . Daily headache   . Chronic kidney disease (CKD), stage III (moderate)   . MI (myocardial infarction)   . Coronary artery disease   . Anginal pain   . Depression     PAST SURGICAL HISTORY:   Past Surgical History  Procedure Laterality Date  . No past surgeries    . Cardiac catheterization      MC x 1 stent  . Coronary angioplasty    . Left heart catheterization with coronary angiogram N/A 12/12/2013    Procedure: LEFT HEART CATHETERIZATION WITH CORONARY ANGIOGRAM;  Surgeon: Wellington Hampshire, MD;   Location: [facility];  Service: Cardiovascular;  Laterality: N/A;    SOCIAL HISTORY:   Social History  Substance Use Topics  . Smoking status: Former Smoker -- 3.00 packs/day for 48 years    Types: Cigarettes, E-cigarettes    Quit date: 06/07/2010  . Smokeless tobacco: Never Used     Comment: Using electronic cigarette  . Alcohol Use: 3.6 oz/week    6 Cans of beer per week    FAMILY HISTORY:   Family History  Problem Relation Age of Onset  . Coronary artery disease Brother   . Heart disease Brother 30  . Heart disease Father     MI x 8    DRUG ALLERGIES:   Allergies  Allergen Reactions  . Benzodiazepines Anaphylaxis and Shortness Of Breath  . Paroxetine Hcl Shortness Of Breath and Palpitations  . Serotonin Reuptake Inhibitors (Ssris) Shortness Of Breath and Palpitations  . Diazepam Other (See Comments)    Rapid heartrate  . Doxycycline Monohydrate Other (See Comments)    Unknown allergic reaction  . Escitalopram Oxalate Other (See Comments)    serotonin Syndrome  . Isosorbide     Feels like head was going to explode  . Lorazepam Other (See Comments)    Unknown allergic reaction  . Tetracyclines & Related Itching and Rash    REVIEW OF SYSTEMS:   Review of Systems  Constitutional: Negative for fever, chills,  weight loss, malaise/fatigue and diaphoresis.  HENT: Negative for congestion and hearing loss.   Eyes: Negative for blurred vision and pain.  Respiratory: Positive for shortness of breath. Negative for cough, hemoptysis, sputum production, wheezing and stridor.   Cardiovascular: Positive for chest pain. Negative for palpitations, orthopnea, leg swelling and PND.  Gastrointestinal: Positive for nausea. Negative for vomiting, abdominal pain, diarrhea, constipation and blood in stool.  Genitourinary: Negative for dysuria and frequency.  Musculoskeletal: Negative for myalgias, back pain, joint pain and neck pain.  Skin: Negative for rash.  Neurological:  Negative for focal weakness, loss of consciousness and headaches.  Endo/Heme/Allergies: Does not bruise/bleed easily.  Psychiatric/Behavioral: Negative for depression and hallucinations. The patient is nervous/anxious.     MEDICATIONS AT HOME:   Prior to Admission medications   Medication Sig Start Date End Date Taking? Authorizing Provider  acetaminophen (TYLENOL) 500 MG tablet Take 1,000 mg by mouth daily as needed for headache.    Yes Historical Provider, MD  ALPRAZolam (XANAX XR) 3 MG 24 hr tablet Take 3 mg by mouth every evening. For anxiety control   Yes Historical Provider, MD  ALPRAZolam Duanne Moron) 1 MG tablet Take 1 mg by mouth daily.  11/21/13  Yes Historical Provider, MD  aspirin EC 81 MG EC tablet Take 1 tablet (81 mg total) by mouth daily. 12/14/13  Yes Karlene Einstein, MD  budesonide-formoterol [facility]) 160-4.5 MCG/ACT inhaler Inhale 2 puffs into the lungs 2 (two) times daily.   Yes Historical Provider, MD  metoprolol succinate (TOPROL-XL) 25 MG 24 hr tablet Take 1 tablet (25 mg total) by mouth daily. 11/23/14  Yes Debbe Odea, MD  nitroGLYCERIN (NITROSTAT) 0.4 MG SL tablet Place 1 tablet (0.4 mg total) under the tongue every 5 (five) minutes as needed for chest pain. Call 911 after 3rd tablet 12/16/14  Yes Minna Merritts, MD  omeprazole (PRILOSEC) 40 MG capsule Take 1 capsule (40 mg total) by mouth daily. 12/16/14  Yes Minna Merritts, MD  pravastatin (PRAVACHOL) 80 MG tablet TAKE ONE TABLET BY MOUTH AT BEDTIME 01/20/15  Yes Minna Merritts, MD  roflumilast (DALIRESP) 500 MCG TABS tablet Take 500 mcg by mouth daily.   Yes Historical Provider, MD  sertraline (ZOLOFT) 50 MG tablet Take 50 mg by mouth at bedtime.   Yes Historical Provider, MD  ticagrelor (BRILINTA) 60 MG TABS tablet Take 1 tablet (60 mg total) by mouth 2 (two) times daily. 12/16/14  Yes Minna Merritts, MD  tiotropium (SPIRIVA) 18 MCG inhalation capsule Place 18 mcg into inhaler and inhale daily.   Yes Historical  Provider, MD      VITAL SIGNS:  Blood pressure 116/75, pulse 71, temperature 98.2 F (36.8 C), temperature source Oral, resp. rate 15, height 5\' 9"  (1.753 m), weight 80.74 kg (178 lb), SpO2 99 %.  PHYSICAL EXAMINATION:  GENERAL:  64 y.o.-year-old patient lying in the bed with no acute distress.  EYES: Pupils equal, round, reactive to light and accommodation. No scleral icterus. Extraocular muscles intact.  HEENT: Head atraumatic, normocephalic. Oropharynx and nasopharynx clear.  NECK:  Supple, no jugular venous distention. No thyroid enlargement, no tenderness.  LUNGS: Normal breath sounds bilaterally, no wheezing, rales,rhonchi or crepitation. No use of accessory muscles of respiration.  CARDIOVASCULAR: S1, S2 normal. No murmurs, rubs, or gallops.  ABDOMEN: Soft, nontender, nondistended. Bowel sounds present. No organomegaly or mass.  EXTREMITIES: No pedal edema, cyanosis, or clubbing. Peripheral pulses are 2+  NEUROLOGIC: Cranial nerves II through XII are intact.  Muscle strength 5/5 in all extremities. Sensation intact. Gait not checked.  PSYCHIATRIC: The patient is alert and oriented x 3. Calm SKIN: No obvious rash, lesion, or ulcer. 3 small abrasions over right anterior lower leg  LABORATORY PANEL:   CBC  Recent Labs Lab 03/01/15 1103  WBC 8.3  HGB 14.6  HCT 41.2  PLT 267   ------------------------------------------------------------------------------------------------------------------  Chemistries   Recent Labs Lab 03/01/15 1103  NA 135  K 4.2  CL 100*  CO2 26  GLUCOSE 101*  BUN 14  CREATININE 1.61*  CALCIUM 9.2   ------------------------------------------------------------------------------------------------------------------  Cardiac Enzymes  Recent Labs Lab 03/01/15 1103  TROPONINI <0.03   ------------------------------------------------------------------------------------------------------------------  RADIOLOGY:  Dg Chest 2 View  03/01/2015    CLINICAL DATA:  Chest pain. Left chest wall tenderness with palpation radiating down the left arm.  EXAM: CHEST  2 VIEW  COMPARISON:  Chest radiograph 11/22/2014  FINDINGS: Stable cardiac and mediastinal contours. No consolidative pulmonary opacities. No pleural effusion or pneumothorax. Regional skeleton is unremarkable.  IMPRESSION: No active cardiopulmonary disease.   Electronically Signed   By: Lovey Newcomer M.D.   On: 03/01/2015 11:16    EKG:   Orders placed or performed during the hospital encounter of 03/01/15  . EKG 12-Lead  . EKG 12-Lead  . ED EKG within 10 minutes  . ED EKG within 10 minutes    IMPRESSION AND PLAN:   #1 chest pain: In this patient with history of known coronary artery disease is concerning. He is being admitted for ACS rule out. Will admit to telemetry, cycle cardiac enzymes, continue aspirin, statin, beta blocker, Brilinta. Provide nitroglycerin as needed for chest pain. [facility] consult cardiology as this is his second admission for chest pain over the past 3 months the last being in 12/2014 at [facility].  #2 COPD: No current exacerbation continue with Spiriva, Daliresp, Symbicort  #3 GERD: Continue with PPI  #4 anxiety: Will discontinue Zoloft at this time. Continue with alprazolam as he takes this at home.  CODE STATUS: Full   TOTAL TIME TAKING CARE OF THIS PATIENT: 40 minutes.  Greater than 50% of time spent in coordination of care and counseling. Care plan discussed with the patient, his wife and emergency room physician on admission  Myrtis Ser M.D on 03/01/2015 at 12:52 PM  Between 7am to 6pm - Pager - [phone]  After 6pm go to [email or web address] - password [email or web address] [facility]  [facility]  Office  [phone]  CC: Primary care physician; Leonides Sake, MD

## 2015-03-02 NOTE — Discharge Summary (Signed)
[place] [facility] [place] NAME: William Mueller    MR#:  [id number]  DATE OF BIRTH:  11-04-1950  DATE OF ADMISSION:  03/01/2015 ADMITTING PHYSICIAN: Aldean Jewett, MD  DATE OF DISCHARGE: 03/02/2015  PRIMARY CARE PHYSICIAN: HAMRICK,MAURA L, MD    ADMISSION DIAGNOSIS:  Chest pain, unspecified chest pain type [R07.9]  DISCHARGE DIAGNOSIS:  Active Problems:   Chest pain   SECONDARY DIAGNOSIS:   Past Medical History  Diagnosis Date  . Hypertension   . Anxiety   . COPD (chronic obstructive pulmonary disease)   . High cholesterol   . GERD (gastroesophageal reflux disease)   . Daily headache   . Chronic kidney disease (CKD), stage III (moderate)   . MI (myocardial infarction)   . Coronary artery disease   . Anginal pain   . Depression     HOSPITAL COURSE:   64 year old male with past medical history significant for coronary artery disease status post RCA stent in July 2015, CK D with baseline creatinine of 1.9, COPD and ongoing smoking presents to the hospital secondary to chest pain.  #1 chest pain-likely atypical chest pain. Could be musculoskeletal in origin. -Started with tremors after starting his new medication Zoloft, which gradually cause soreness of muscles and also had some chest soreness. -He was admitted to telemetry to rule out MI. Troponins have remained negative 3. -Last stress test was in January 2016 which was normal with EF of 53% and no wall motion abnormalities. Seen Dr. Rockey Situ in July 2016. Has had 2 or 3 ER visits for chest pain. -Recommended outpatient follow-up with Dr. Kennyth Arnold in and outpatient stress test repeat if needed. Patient agreeable to plan. Wife at bedside.  #2 CAD-status post RCA stent in July 2015. -Continue aspirin, Brilinta, statin, metoprolol and sublingual nitroglycerin as needed. -Cardiology follow up as outpatient  #3 COPD-appears stable currently. Continue home inhalers.  #4 depression  and anxiety-hold Zoloft as it has been started Causing some side effects. Follow up with PCP for the same. -Continue other anxiety medications  #5 hyperlipidemia-continue high-dose statin  Discharge today.  DISCHARGE CONDITIONS:   stable  CONSULTS OBTAINED:   None  DRUG ALLERGIES:   Allergies  Allergen Reactions  . Benzodiazepines Anaphylaxis and Shortness Of Breath  . Paroxetine Hcl Shortness Of Breath and Palpitations  . Serotonin Reuptake Inhibitors (Ssris) Shortness Of Breath and Palpitations  . Diazepam Other (See Comments)    Rapid heartrate  . Doxycycline Monohydrate Other (See Comments)    Unknown allergic reaction  . Escitalopram Oxalate Other (See Comments)    serotonin Syndrome  . Isosorbide     Feels like head was going to explode  . Lorazepam Other (See Comments)    Unknown allergic reaction  . Tetracyclines & Related Itching and Rash    DISCHARGE MEDICATIONS:   Current Discharge Medication List    CONTINUE these medications which have NOT CHANGED   Details  acetaminophen (TYLENOL) 500 MG tablet Take 1,000 mg by mouth daily as needed for headache.     ALPRAZolam (XANAX XR) 3 MG 24 hr tablet Take 3 mg by mouth every evening. For anxiety control    ALPRAZolam (XANAX) 1 MG tablet Take 1 mg by mouth daily.     aspirin EC 81 MG EC tablet Take 1 tablet (81 mg total) by mouth daily. Qty: 30 tablet, Refills: 0    budesonide-formoterol (SYMBICORT) 160-4.5 MCG/ACT inhaler Inhale 2 puffs into the lungs 2 (two) times  daily.    metoprolol succinate (TOPROL-XL) 25 MG 24 hr tablet Take 1 tablet (25 mg total) by mouth daily. Qty: 30 tablet, Refills: 6    nitroGLYCERIN (NITROSTAT) 0.4 MG SL tablet Place 1 tablet (0.4 mg total) under the tongue every 5 (five) minutes as needed for chest pain. Call 911 after 3rd tablet Qty: 25 tablet, Refills: 3    omeprazole (PRILOSEC) 40 MG capsule Take 1 capsule (40 mg total) by mouth daily. Qty: 30 capsule, Refills: 11     pravastatin (PRAVACHOL) 80 MG tablet TAKE ONE TABLET BY MOUTH AT BEDTIME Qty: 30 tablet, Refills: 3    roflumilast (DALIRESP) 500 MCG TABS tablet Take 500 mcg by mouth daily.    ticagrelor (BRILINTA) 60 MG TABS tablet Take 1 tablet (60 mg total) by mouth 2 (two) times daily. Qty: 60 tablet, Refills: 11    tiotropium (SPIRIVA) 18 MCG inhalation capsule Place 18 mcg into inhaler and inhale daily.      STOP taking these medications     sertraline (ZOLOFT) 50 MG tablet          DISCHARGE INSTRUCTIONS:   1. F/u with Dr. Rockey Situ in 1 week 2. PCP f/u in 2 weeks  If you experience worsening of your admission symptoms, develop shortness of breath, life threatening emergency, suicidal or homicidal thoughts you must seek medical attention immediately by calling 911 or calling your MD immediately  if symptoms less severe.  You Must read complete instructions/literature along with all the possible adverse reactions/side effects for all the Medicines you take and that have been prescribed to you. Take any new Medicines after you have completely understood and accept all the possible adverse reactions/side effects.   Please note  You were cared for by a hospitalist during your hospital stay. If you have any questions about your discharge medications or the care you received while you were in the hospital after you are discharged, you can call the unit and asked to speak with the hospitalist on call if the hospitalist that took care of you is not available. Once you are discharged, your primary care physician will handle any further medical issues. Please note that NO REFILLS for any discharge medications will be authorized once you are discharged, as it is imperative that you return to your primary care physician (or establish a relationship with a primary care physician if you do not have one) for your aftercare needs so that they can reassess your need for medications and monitor your lab  values.    Today   CHIEF COMPLAINT:   Chief Complaint  Patient presents with  . Chest Pain    VITAL SIGNS:  Blood pressure 99/64, pulse 76, temperature 98.2 F (36.8 C), temperature source Oral, resp. rate 18, height 5\' 9"  (1.753 m), weight 78.881 kg (173 lb 14.4 oz), SpO2 95 %.  I/O:   Intake/Output Summary (Last 24 hours) at 03/02/15 1025 Last data filed at 03/02/15 0900  Gross per 24 hour  Intake    480 ml  Output    300 ml  Net    180 ml    PHYSICAL EXAMINATION:   Physical Exam  GENERAL:  64 y.o.-year-old patient lying in the bed with no acute distress.  EYES: Pupils equal, round, reactive to light and accommodation. No scleral icterus. Extraocular muscles intact.  HEENT: Head atraumatic, normocephalic. Oropharynx and nasopharynx clear. Minimal left facial droop NECK:  Supple, no jugular venous distention. No thyroid enlargement, no tenderness.  LUNGS:  Normal breath sounds bilaterally, no wheezing, rales,rhonchi or crepitation. No use of accessory muscles of respiration.  CARDIOVASCULAR: S1, S2 normal. No murmurs, rubs, or gallops.  ABDOMEN: Soft, non-tender, non-distended. Bowel sounds present. No organomegaly or mass.  EXTREMITIES: No pedal edema, cyanosis, or clubbing.  NEUROLOGIC: Cranial nerves II through XII are intact. Muscle strength 5/5 in all extremities. Sensation intact. Gait not checked.  PSYCHIATRIC: The patient is alert and oriented x 3.  SKIN: No obvious rash, lesion, or ulcer.   DATA REVIEW:   CBC  Recent Labs Lab 03/01/15 1103  WBC 8.3  HGB 14.6  HCT 41.2  PLT 267    Chemistries   Recent Labs Lab 03/01/15 1103  NA 135  K 4.2  CL 100*  CO2 26  GLUCOSE 101*  BUN 14  CREATININE 1.61*  CALCIUM 9.2    Cardiac Enzymes  Recent Labs Lab 03/01/15 2110  TROPONINI <0.03    Microbiology Results  No results found for this or any previous visit.  RADIOLOGY:  Dg Chest 2 View  03/01/2015   CLINICAL DATA:  Chest pain. Left chest  wall tenderness with palpation radiating down the left arm.  EXAM: CHEST  2 VIEW  COMPARISON:  Chest radiograph 11/22/2014  FINDINGS: Stable cardiac and mediastinal contours. No consolidative pulmonary opacities. No pleural effusion or pneumothorax. Regional skeleton is unremarkable.  IMPRESSION: No active cardiopulmonary disease.   Electronically Signed   By: Lovey Newcomer M.D.   On: 03/01/2015 11:16    EKG:   Orders placed or performed during the hospital encounter of 03/01/15  . EKG 12-Lead  . EKG 12-Lead  . ED EKG within 10 minutes  . ED EKG within 10 minutes      Management plans discussed with the patient, family and they are in agreement.  CODE STATUS:     Code Status Orders        Start     Ordered   03/01/15 1511  Full code   Continuous     03/01/15 1510      TOTAL TIME TAKING CARE OF THIS PATIENT: 36 minutes.    Gladstone Lighter M.D on 03/02/2015 at 10:25 AM  Between 7am to 6pm - Pager - [phone]  After 6pm go to [email or web address] - password [email or web address] [facility]  [place]  Office  [phone]  CC: Primary care physician; Leonides Sake, MD

## 2015-03-02 NOTE — Progress Notes (Signed)
Pt. Discharged to home via wc. Discharge instructions and medication regimen reviewed at bedside with patient and his wife. Both verbalize understanding of instructions and medication regimen. Pt to follow up with dr. Rockey Situ outpatient. Patient assessment unchanged from this morning. TELE and IV discontinued per policy.

## 2015-03-02 NOTE — Discharge Instructions (Signed)
Cardiopulmonary Stress Test  Cardiopulmonary stress testing looks at how your body uses oxygen and how it responds to exercise. The test will evaluate your:  Heart.  Lungs.  Muscles. INDICATIONS A cardiopulmonary stress test is usually indicated for the following:  Evaluation of unexplained shortness of breath.  Evaluation of exercise tolerance for a person with heart failure or coronary artery disease (CAD).  Evaluation of lung function. This is useful in patients with chronic obstructive pulmonary disease (COPD) or pulmonary hypertension.  To evaluate how fit you are.  To assess your eligibility for disability.  Preoperative evaluation for:  Heart surgery.  Lung surgery.  Heart/Lung transplants. BEFORE THE PROCEDURE  Do not eat for at least two hours prior to the test or as told by your caregiver.  Do not drink alcohol 24 hours prior to the test.  Do not smoke, drink caffeinated or carbonated beverages for at least 6 hours prior to the test.  Wear loose, comfortable clothing and shoes.  If you use an inhaler, you should bring it with you to the test. PROCEDURE  Electrodes will be attached to your chest. These will be attached to a monitor that will monitor your heart rate.  A nose clip will be applied to your nose.  A headpiece will be placed on your head. This will hold a breathing tube in place that you will breath through during the test.  You will exercise using either a bicycle or a treadmill.  Usually the study will begin with a few minutes of simple exercise followed by increased exercise.  Your blood pressure, heart rate and breathing will be monitored during and after the test. You will go home when your caregiver feels it is safe for you to do so. SEEK IMMEDIATE MEDICAL CARE IF:  You develop pain or pressure in the following areas:  Chest.  Jaw or neck.  Between your shoulder blades.  Pain radiating down your left arm.  You develop nausea  (feeling sick to your stomach).  You develop vomiting.  You develop fainting or dizziness.  You develop shortness of breath. MAKE SURE YOU:   Understand these instructions.  Will watch your condition.  Will get help right away if you are not doing well or get worse. Document Released: 05/12/2009 Document Revised: 08/16/2011 Document Reviewed: 05/12/2009 [facility] Patient Information 2015 [place], [place]. This information is not intended to replace advice given to you by your health care provider. Make sure you discuss any questions you have with your health care provider.

## 2015-03-09 ENCOUNTER — Encounter: Payer: Self-pay | Admitting: Physician Assistant

## 2015-03-09 DIAGNOSIS — R079 Chest pain, unspecified: Secondary | ICD-10-CM

## 2015-03-09 DIAGNOSIS — I5189 Other ill-defined heart diseases: Secondary | ICD-10-CM | POA: Insufficient documentation

## 2015-03-09 DIAGNOSIS — G8929 Other chronic pain: Secondary | ICD-10-CM | POA: Insufficient documentation

## 2015-03-11 ENCOUNTER — Encounter: Payer: Self-pay | Admitting: Physician Assistant

## 2015-03-11 ENCOUNTER — Other Ambulatory Visit
Admission: RE | Admit: 2015-03-11 | Discharge: 2015-03-11 | Disposition: A | Discharge status: Home / Self Care | Payer: PPO | Source: Ambulatory Visit | Attending: Physician Assistant | Admitting: Physician Assistant

## 2015-03-11 ENCOUNTER — Ambulatory Visit (INDEPENDENT_AMBULATORY_CARE_PROVIDER_SITE_OTHER): Payer: PPO | Admitting: Physician Assistant

## 2015-03-11 VITALS — BP 130/82 | HR 83 | Ht 69.0 in | Wt 175.8 lb

## 2015-03-11 DIAGNOSIS — I200 Unstable angina: Secondary | ICD-10-CM | POA: Diagnosis not present

## 2015-03-11 DIAGNOSIS — I2510 Atherosclerotic heart disease of native coronary artery without angina pectoris: Secondary | ICD-10-CM

## 2015-03-11 DIAGNOSIS — Z9861 Coronary angioplasty status: Secondary | ICD-10-CM

## 2015-03-11 DIAGNOSIS — I25110 Atherosclerotic heart disease of native coronary artery with unstable angina pectoris: Secondary | ICD-10-CM | POA: Diagnosis not present

## 2015-03-11 DIAGNOSIS — R079 Chest pain, unspecified: Secondary | ICD-10-CM

## 2015-03-11 DIAGNOSIS — N183 Chronic kidney disease, stage 3 unspecified: Secondary | ICD-10-CM

## 2015-03-11 DIAGNOSIS — I10 Essential (primary) hypertension: Secondary | ICD-10-CM

## 2015-03-11 DIAGNOSIS — E785 Hyperlipidemia, unspecified: Secondary | ICD-10-CM

## 2015-03-11 DIAGNOSIS — J432 Centrilobular emphysema: Secondary | ICD-10-CM

## 2015-03-11 LAB — BASIC METABOLIC PANEL
Anion gap: 6 (ref 5–15)
BUN: 12 mg/dL (ref 6–20)
CALCIUM: 9.2 mg/dL (ref 8.9–10.3)
CO2: 29 mmol/L (ref 22–32)
CREATININE: 1.41 mg/dL — AB (ref 0.61–1.24)
Chloride: 101 mmol/L (ref 101–111)
GFR calc non Af Amer: 51 mL/min — ABNORMAL LOW (ref >60)
GFR, EST AFRICAN AMERICAN: 59 mL/min — AB (ref >60)
Glucose, Bld: 95 mg/dL (ref 65–99)
Potassium: 3.8 mmol/L (ref 3.5–5.1)
SODIUM: 136 mmol/L (ref 135–145)

## 2015-03-11 LAB — CBC
HCT: 41.7 % (ref 40.0–52.0)
Hemoglobin: 14.3 g/dL (ref 13.0–18.0)
MCH: 29.6 pg (ref 26.0–34.0)
MCHC: 34.4 g/dL (ref 32.0–36.0)
MCV: 86.2 fL (ref 80.0–100.0)
PLATELETS: 259 10*3/uL (ref 150–440)
RBC: 4.84 MIL/uL (ref 4.40–5.90)
RDW: 12.6 % (ref 11.5–14.5)
WBC: 6.7 10*3/uL (ref 3.8–10.6)

## 2015-03-11 LAB — PROTIME-INR
INR: 0.97
PROTHROMBIN TIME: 13.1 s (ref 11.4–15.0)

## 2015-03-11 NOTE — Progress Notes (Signed)
Cardiology Office Note:  Date of Encounter: 03/11/2015  ID: William Mueller, DOB 07/05/1950, MRN [id number]  PCP:  Leonides Sake, MD Primary Cardiologist:  Dr. Rockey Situ, MD  Chief Complaint  Patient presents with  . other    Follow up from [facility] ER; patient was having chest pain. Pt. c/o chest pain at times that comes and goes, mostly in the am.     HPI:  64 year old male with history of CAD with occluded mid RCA s/p PCI/DES, HTN, long standing history of tobacco abuse quitting approximately 5 years ago, COPD, CKD stage II-III, rectal dysfunction, GERD, anxiety on Xanax, daily headache, and history of chest pain who with previous admission to [facility] in 11/2014 for nonexertional chest pain who presents for hospital follow up after recent admission to [facility] from 9/24 to 9/25 for atypical chest pain felt to musculoskeletal in etiology.   He presented to [facility] in 12/2013 with substernal chest pain. He had recently undergone a Myoview 6 weeks prior by outside cardiologist that was reportedly normal. He underwent cardiac cath on 12/12/2013 that showed left main normal, mid LAD diffuse 20%, ostial D1 50%, prox D1 50%, D2 30%, mid LCx moderately calcified with 30%, RCA mildly calcified with diffuse 30% proximally. The vessel was occluded in the midsegment with TIMI 1 flow. Faint left to right collaterals were noted. He underwent successful PCI/DES to the RCA with TIMI 3 flow. Echo showed EF 55-60%, mild LVH, GR1DD, inferior HK, elevated CVP, dilated IVC suggestive of elevated RA pressure.   He was hospitalized again on 02/26/2014 with chest pain. He was seen cardiology and ruled out for MI, had normal ECG and was discharged home. He presented on 05/04/2014 for chest pain. Cardiology saw patient. Cardiac enzymes were negative. ECG with no acute changes. He left [facility]. He presented again on 06/08/2014 for chest pain. Cardiology saw the patient. Cardiac enzymes were  negative. ECG with no acute changes. He had missed a few doses of Brilinta. He underwent Lexiscan Myoview that did not show any reversible ischemia or infarction. LV EF 53%, normal wall motion, low risk stress test. He presented again to the hospital on 11/23/2014 with chest pain, negative troponin, nonspecific T-wave changes on ECG. He was started on Imdur 30 mg daily and advised he could stop Brilinta in July 2016 (12 months post stenting).   In hospital follow up on 12/16/2014 he reported being unable to tolerate Imdur secondary to headaches. Had not been needing SL NTG. No further work up was advised. His wife called on 9/13 stating his BP was "crazy." Though reported reading were 110/77, 140/91, and 103/73, with pulses of 102, 100 respectively. He was noting headache and lower back pain. He was sleeping at that time, and she did not want to wake him. No medications given.   He presented to [facility] on 9/24 with complaints of 2-3 day history of left sided chest pain with associated nausea, and SOB. Troponin nevative x 3, ECG showed NSR, 87 bpm, nonspecific T-wave changes. CXR with no active cardiopulmonary disease. Pain started after starting new medication Zolft leading to tremors and muscle soreness. He was discharged with outpatient cardiology follow up.   He last had left sided chest pain on 10/3. Pain lasted several minutes, and self resolved. He notes considerable fatigue and SOB over the past several months. He has lost 11 pounds since he was last seen here, not trying. At times chest pain is exertional,  then others it is nonexertional. 2 most recent episodes of chest pain (the one that led to his [facility] admission on the most recent) have not felt like any of the previous chest pain admissions above, nor did they feel like his episode back in 12/2013 that led to his PCI. His most recent episode at [facility] was actually reproducible to palpation, but he has continued to have chest pain coupled to fatigue and SOB.  He also notes episodes of belching, previously advised to treat with Coke, but this has has not helped and his symptoms are worsening.      Past Medical History  Diagnosis Date  . Hypertension   . Anxiety   . COPD (chronic obstructive pulmonary disease) ([place])   . High cholesterol   . GERD (gastroesophageal reflux disease)   . Daily headache   . Chronic kidney disease (CKD), stage III (moderate)   . MI (myocardial infarction) ([place])   . Coronary artery disease     a. cath 12/2013: LM nl, mLAD 20%, ost D1 50%, pD1 50%, D2 30%, mLCx 30%, pRCA 30%, mRCA 100% with L to R collats s/p PCI/DES  . Depression   . Diastolic dysfunction     a. echo 12/2013: EF 55-60%, mild LVH, GR1DD, inf HK, elevated CVP, mildly dilated IVC suggestive of increased RA pressure  . Chronic chest pain   :  Past Surgical History  Procedure Laterality Date  . No past surgeries    . Cardiac catheterization      MC x 1 stent  . Coronary angioplasty    . Left heart catheterization with coronary angiogram N/A 12/12/2013    Procedure: LEFT HEART CATHETERIZATION WITH CORONARY ANGIOGRAM;  Surgeon: Wellington Hampshire, MD;  Location: [facility] CATH LAB;  Service: Cardiovascular;  Laterality: N/A;  :  Social History:  The patient  reports that he quit smoking about 4 years ago. His smoking use included Cigarettes and E-cigarettes. He has a 144 pack-year smoking history. He has never used smokeless tobacco. He reports that he drinks about 3.6 oz of alcohol per week. He reports that he does not use illicit drugs.   Family History  Problem Relation Age of Onset  . Coronary artery disease Brother   . Heart disease Brother 7  . Heart disease Father     MI x 8     Allergies:  Allergies  Allergen Reactions  . Benzodiazepines Anaphylaxis and Shortness Of Breath  . Paroxetine Hcl Shortness Of Breath and Palpitations  . Serotonin Reuptake Inhibitors (Ssris) Shortness Of Breath and Palpitations  . Diazepam Other (See Comments)     Rapid heartrate  . Doxycycline Monohydrate Other (See Comments)    Unknown allergic reaction  . Escitalopram Oxalate Other (See Comments)    serotonin Syndrome  . Isosorbide     Feels like head was going to explode  . Lorazepam Other (See Comments)    Unknown allergic reaction  . Tetracyclines & Related Itching and Rash     Home Medications:  Current Outpatient Prescriptions  Medication Sig Dispense Refill  . acetaminophen (TYLENOL) 500 MG tablet Take 1,000 mg by mouth daily as needed for headache.     . ALPRAZolam (XANAX XR) 3 MG 24 hr tablet Take 3 mg by mouth every evening. For anxiety control    . ALPRAZolam (XANAX) 1 MG tablet Take 1 mg by mouth daily.     Marland Kitchen aspirin EC 81 MG EC tablet Take 1 tablet (81 mg total) by mouth  daily. 30 tablet 0  . budesonide-formoterol (SYMBICORT) 160-4.5 MCG/ACT inhaler Inhale 2 puffs into the lungs 2 (two) times daily.    . metoprolol succinate (TOPROL-XL) 25 MG 24 hr tablet Take 1 tablet (25 mg total) by mouth daily. 30 tablet 6  . nitroGLYCERIN (NITROSTAT) 0.4 MG SL tablet Place 1 tablet (0.4 mg total) under the tongue every 5 (five) minutes as needed for chest pain. Call 911 after 3rd tablet 25 tablet 3  . omeprazole (PRILOSEC) 40 MG capsule Take 1 capsule (40 mg total) by mouth daily. 30 capsule 11  . pravastatin (PRAVACHOL) 80 MG tablet TAKE ONE TABLET BY MOUTH AT BEDTIME 30 tablet 3  . roflumilast (DALIRESP) 500 MCG TABS tablet Take 500 mcg by mouth daily.    . ticagrelor (BRILINTA) 60 MG TABS tablet Take 1 tablet (60 mg total) by mouth 2 (two) times daily. 60 tablet 11  . tiotropium (SPIRIVA) 18 MCG inhalation capsule Place 18 mcg into inhaler and inhale daily.     No current facility-administered medications for this visit.     Review of Systems:  Review of Systems  Constitutional: Positive for malaise/fatigue and diaphoresis. Negative for fever, chills and weight loss.  HENT: Negative for congestion.   Eyes: Negative for discharge  and redness.  Respiratory: Positive for cough and shortness of breath. Negative for hemoptysis, sputum production and wheezing.   Cardiovascular: Positive for chest pain, orthopnea and leg swelling. Negative for palpitations, claudication and PND.  Gastrointestinal: Negative for heartburn, nausea and vomiting.  Musculoskeletal: Negative for falls.  Skin: Negative for rash.  Neurological: Positive for weakness. Negative for dizziness, tingling, tremors, sensory change, speech change, focal weakness, seizures and loss of consciousness.  Endo/Heme/Allergies: Does not bruise/bleed easily.  Psychiatric/Behavioral: Negative for substance abuse. The patient is not nervous/anxious.   All other systems reviewed and are negative.    Physical Exam:  Blood pressure 130/82, pulse 83, height 5\' 9"  (1.753 m), weight 175 lb 12 oz (79.72 kg). BMI: Body mass index is 25.94 kg/(m^2). General: Pleasant, NAD. Psych: Normal affect. Responds to questions with normal affect.  Neuro: Alert and oriented X 3. Moves all extremities spontaneously. HEENT: Normocephalic, atraumatic. EOM intact. Sclera anicteric.  Neck: Trachea midline. Supple without bruits or JVD. Lungs:  Respirations regular and unlabored. CTA bilaterally without wheezing, crackles, or rhonchi.  Heart: RRR, normal s3, s4. No murmurs, rubs, or gallops.  Abdomen: Soft, non-tender, non-distended, BS + x 4.  Extremities: No clubbing, cyanosis or edema. DP/PT/Radials 2+ and equal bilaterally.   Accessory Clinical Findings:  EKG: NSR, 83 bpm, nonspecific st/t change lead I, TWI aVL, 1 mm flat st depression V5, 0.5 mm flat st depression V6  Recent Labs: 11/22/2014: B Natriuretic Peptide 10.6 11/23/2014: ALT 26 03/01/2015: BUN 14; Creatinine, Ser 1.61*; Hemoglobin 14.6; Platelets 267; Potassium 4.2; Sodium 135  No results found for requested labs within last 365 days.  Estimated Creatinine Clearance: 45.7 mL/min (by C-G formula based on Cr of  1.61).  Weights: Wt Readings from Last 3 Encounters:  03/11/15 175 lb 12 oz (79.72 kg)  03/01/15 173 lb 14.4 oz (78.881 kg)  12/16/14 182 lb 4 oz (82.668 kg)    Other studies Reviewed: Additional studies/ records that were reviewed today include: prior hospital admissions and office notes.  Assessment & Plan:  1. Unstable angina/class III angina/abnormal EKG/CAD s/p PCI as above: -Schedule patient for cardiac catheterization on 10/5 with Dr. Ellyn Hack, MD -Patient has previously undergone stress testing x 2, once in  the summer of 2015 that was reportedly normal and was followed up a short while later with recurrent angina leading to the above cardiac cath s/p PCI. His most recent stress test in 06/2014 was also low risk. It is doubtful a stress test in this situation would be of much benefit  -Given his prior cardiac cath and symptomology there is concern for possible worsening of coronary disease  -Continue aspirin 81 mg and Brilinta 60 mg bid -Continue Toprol XL 25 mg daily and pravastatin 80 mg, consider changing to high intensity statin  -Check echo -Risks and benefits of cardiac catheterization have been discussed with the patient including risks of bleeding, bruising, infection, kidney damage, stroke, heart attack, possible need for urgent bypass, and death. The patient understands these risks and is willing to proceed with the procedure. All questions have been answered and concerns listened to.   2. COPD: -Stable -No active flare  3. CKD stage III: -Most recent SCr 1.6 -Limit contrast -If PCI is indicated, may need to perform staged procedure over two days with hydration in between  -Renal function pending  4. HLD: -Not at goal -Consider high intensity statin as above when he can afford this -Currently on pravastatin   5. HTN: -Controlled -Continue Toprol XL 25 mg daily   Dispo: -Follow up post cardiac cath  Current medicines are reviewed at length with the patient  today.  The patient did not have any concerns regarding medicines.   Christell Faith, PA-C [facility] [address] [place], [place] [address] [phone] [place] 03/11/2015, 2:39 PM

## 2015-03-11 NOTE — Patient Instructions (Addendum)
Medication Instructions:  Your physician recommends that you continue on your current medications as directed. Please refer to the Current Medication list given to you today.   Labwork: BMET, PT/INR, CBC  Testing/Procedures: Your physician has requested that you have a cardiac catheterization. Cardiac catheterization is used to diagnose and/or treat various heart conditions. Doctors may recommend this procedure for a number of different reasons. The most common reason is to evaluate chest pain. Chest pain can be a symptom of coronary artery disease (CAD), and cardiac catheterization can show whether plaque is narrowing or blocking your heart's arteries. This procedure is also used to evaluate the valves, as well as measure the blood flow and oxygen levels in different parts of your heart. For further information please visit [email or web address]. Please follow instruction sheet, as given.  [facility] Cardiac Cath Instructions   You are scheduled for a Cardiac Cath on Wed, Oct 5, 8:30am  Please arrive at 7:30am on the day of your procedure    Do not eat/drink anything after midnight  Someone will need to drive you home  It is recommended someone be with you for the first 24 hours after your procedure  Wear clothes that are easy to get on/off and wear slip on shoes if possible   Medications bring a current list of all medications with you    Day of your procedure: Arrive at the [place] entrance.  Free valet service is available.  After entering the [place] please check-in at the registration desk (1st desk on your right) to receive your armband. After receiving your armband someone will escort you to the cardiac cath/special procedures waiting area.  The usual length of stay after your procedure is about 2 to 3 hours.  This can vary.  If you have any questions, please call our office at [phone], or you may call the cardiac cath lab at [facility] directly at [phone]  Your  physician has requested that you have an echocardiogram. Echocardiography is a painless test that uses sound waves to create images of your heart. It provides your doctor with information about the size and shape of your heart and how well your heart's chambers and valves are working. This procedure takes approximately one hour. There are no restrictions for this procedure.    Follow-Up: Your physician recommends that you schedule a follow-up appointment after your catheterization.   Any Other Special Instructions Will Be Listed Below (If Applicable).  \Angiogram An angiogram, also called angiography, is a procedure used to look at the blood vessels that carry blood to different parts of your body (arteries). In this procedure, dye is injected through a long, thin tube (catheter) into an artery. X-rays are then taken. The X-rays will show if there is a blockage or problem in a blood vessel.  LET [facility] CARE PROVIDER KNOW ABOUT:  Any allergies you have, including allergies to shellfish or contrast dye.   All medicines you are taking, including vitamins, herbs, eye drops, creams, and over-the-counter medicines.   Previous problems you or members of your family have had with the use of anesthetics.   Any blood disorders you have.   Previous surgeries you have had.  Any previous kidney problems or failure you have had.  Medical conditions you have.   Possibility of pregnancy, if this applies. RISKS AND COMPLICATIONS Generally, an angiogram is a safe procedure. However, as with any procedure, problems can occur. Possible problems include:  Injury to the blood vessels, including rupture or bleeding.  Infection or bruising at the catheter site.  Allergic reaction to the dye or contrast used.  Kidney damage from the dye or contrast used.  Blood clots that can lead to a stroke or heart attack. BEFORE THE PROCEDURE  Do not eat or drink after midnight on the night before  the procedure, or as directed by your health care provider.   Ask your health care provider if you may drink enough water to take any needed medicines the morning of the procedure.  PROCEDURE  You may be given a medicine to help you relax (sedative) before and during the procedure. This medicine is given through an IV access tube that is inserted into one of your veins.   The area where the catheter will be inserted will be washed and shaved. This is usually done in the groin but may be done in the fold of your arm (near your elbow) or in the wrist.  A medicine will be given to numb the area where the catheter will be inserted (local anesthetic).  The catheter will be inserted with a guide wire into an artery. The catheter is guided by using a type of X-ray (fluoroscopy) to the blood vessel being examined.   Dye is then injected into the catheter, and X-rays are taken. The dye helps to show where any narrowing or blockages are located.  AFTER THE PROCEDURE   If the procedure is done through the leg, you will be kept in bed lying flat for several hours. You will be instructed to not bend or cross your legs.  The insertion site will be checked frequently.  The pulse in your feet or wrist will be checked frequently.  Additional blood tests, X-rays, and electrocardiography may be done.   You may need to stay in the hospital overnight for observation.  Document Released: 03/03/2005 Document Revised: 05/29/2013 Document Reviewed: 10/25/2012 [facility] Patient Information 2015 [place], [place]. This information is not intended to replace advice given to you by your health care provider. Make sure you discuss any questions you have with your health care provider. Echocardiogram An echocardiogram, or echocardiography, uses sound waves (ultrasound) to produce an image of your heart. The echocardiogram is simple, painless, obtained within a short period of time, and offers valuable information  to your health care provider. The images from an echocardiogram can provide information such as:  Evidence of coronary artery disease (CAD).  Heart size.  Heart muscle function.  Heart valve function.  Aneurysm detection.  Evidence of a past heart attack.  Fluid buildup around the heart.  Heart muscle thickening.  Assess heart valve function. LET [facility] CARE PROVIDER KNOW ABOUT:  Any allergies you have.  All medicines you are taking, including vitamins, herbs, eye drops, creams, and over-the-counter medicines.  Previous problems you or members of your family have had with the use of anesthetics.  Any blood disorders you have.  Previous surgeries you have had.  Medical conditions you have.  Possibility of pregnancy, if this applies. BEFORE THE PROCEDURE  No special preparation is needed. Eat and drink normally.  PROCEDURE   In order to produce an image of your heart, gel will be applied to your chest and a wand-like tool (transducer) will be moved over your chest. The gel will help transmit the sound waves from the transducer. The sound waves will harmlessly bounce off your heart to allow the heart images to be captured in real-time motion. These images will then be recorded.  You may  need an IV to receive a medicine that improves the quality of the pictures. AFTER THE PROCEDURE You may return to your normal schedule including diet, activities, and medicines, unless your health care provider tells you otherwise. Document Released: 05/21/2000 Document Revised: 10/08/2013 Document Reviewed: 01/29/2013 [facility] Patient Information 2015 [place], [place]. This information is not intended to replace advice given to you by your health care provider. Make sure you discuss any questions you have with your health care provider.

## 2015-03-12 ENCOUNTER — Encounter: Payer: Self-pay | Admitting: *Deleted

## 2015-03-12 ENCOUNTER — Ambulatory Visit
Admission: RE | Admit: 2015-03-12 | Discharge: 2015-03-12 | Disposition: A | Discharge status: Home / Self Care | Payer: PPO | Source: Ambulatory Visit | Attending: Cardiology | Admitting: Cardiology

## 2015-03-12 ENCOUNTER — Encounter
Admission: RE | Disposition: A | Discharge status: Home / Self Care | Payer: Self-pay | Source: Ambulatory Visit | Attending: Cardiology

## 2015-03-12 DIAGNOSIS — I200 Unstable angina: Secondary | ICD-10-CM | POA: Diagnosis present

## 2015-03-12 DIAGNOSIS — Z881 Allergy status to other antibiotic agents status: Secondary | ICD-10-CM | POA: Insufficient documentation

## 2015-03-12 DIAGNOSIS — Z888 Allergy status to other drugs, medicaments and biological substances status: Secondary | ICD-10-CM | POA: Diagnosis not present

## 2015-03-12 DIAGNOSIS — Z79899 Other long term (current) drug therapy: Secondary | ICD-10-CM | POA: Diagnosis not present

## 2015-03-12 DIAGNOSIS — Z7982 Long term (current) use of aspirin: Secondary | ICD-10-CM | POA: Insufficient documentation

## 2015-03-12 DIAGNOSIS — I25110 Atherosclerotic heart disease of native coronary artery with unstable angina pectoris: Secondary | ICD-10-CM | POA: Insufficient documentation

## 2015-03-12 DIAGNOSIS — E785 Hyperlipidemia, unspecified: Secondary | ICD-10-CM | POA: Diagnosis present

## 2015-03-12 DIAGNOSIS — I10 Essential (primary) hypertension: Secondary | ICD-10-CM | POA: Diagnosis present

## 2015-03-12 DIAGNOSIS — J449 Chronic obstructive pulmonary disease, unspecified: Secondary | ICD-10-CM | POA: Insufficient documentation

## 2015-03-12 DIAGNOSIS — N183 Chronic kidney disease, stage 3 unspecified: Secondary | ICD-10-CM | POA: Diagnosis present

## 2015-03-12 DIAGNOSIS — K219 Gastro-esophageal reflux disease without esophagitis: Secondary | ICD-10-CM | POA: Diagnosis not present

## 2015-03-12 DIAGNOSIS — F329 Major depressive disorder, single episode, unspecified: Secondary | ICD-10-CM | POA: Diagnosis not present

## 2015-03-12 DIAGNOSIS — R079 Chest pain, unspecified: Secondary | ICD-10-CM | POA: Diagnosis not present

## 2015-03-12 DIAGNOSIS — I25119 Atherosclerotic heart disease of native coronary artery with unspecified angina pectoris: Secondary | ICD-10-CM

## 2015-03-12 DIAGNOSIS — I1310 Hypertensive heart and chronic kidney disease without heart failure, with stage 1 through stage 4 chronic kidney disease, or unspecified chronic kidney disease: Secondary | ICD-10-CM | POA: Insufficient documentation

## 2015-03-12 DIAGNOSIS — E7800 Pure hypercholesterolemia, unspecified: Secondary | ICD-10-CM | POA: Diagnosis not present

## 2015-03-12 DIAGNOSIS — Z8249 Family history of ischemic heart disease and other diseases of the circulatory system: Secondary | ICD-10-CM | POA: Insufficient documentation

## 2015-03-12 DIAGNOSIS — I209 Angina pectoris, unspecified: Secondary | ICD-10-CM | POA: Diagnosis present

## 2015-03-12 DIAGNOSIS — I25118 Atherosclerotic heart disease of native coronary artery with other forms of angina pectoris: Secondary | ICD-10-CM | POA: Diagnosis present

## 2015-03-12 DIAGNOSIS — Z87891 Personal history of nicotine dependence: Secondary | ICD-10-CM | POA: Diagnosis not present

## 2015-03-12 DIAGNOSIS — I471 Supraventricular tachycardia: Secondary | ICD-10-CM | POA: Diagnosis present

## 2015-03-12 DIAGNOSIS — I252 Old myocardial infarction: Secondary | ICD-10-CM | POA: Insufficient documentation

## 2015-03-12 DIAGNOSIS — Z9861 Coronary angioplasty status: Secondary | ICD-10-CM | POA: Insufficient documentation

## 2015-03-12 DIAGNOSIS — F419 Anxiety disorder, unspecified: Secondary | ICD-10-CM | POA: Diagnosis not present

## 2015-03-12 DIAGNOSIS — I2510 Atherosclerotic heart disease of native coronary artery without angina pectoris: Secondary | ICD-10-CM

## 2015-03-12 DIAGNOSIS — R9431 Abnormal electrocardiogram [ECG] [EKG]: Secondary | ICD-10-CM | POA: Insufficient documentation

## 2015-03-12 DIAGNOSIS — I4719 Other supraventricular tachycardia: Secondary | ICD-10-CM | POA: Diagnosis present

## 2015-03-12 DIAGNOSIS — I5189 Other ill-defined heart diseases: Secondary | ICD-10-CM | POA: Diagnosis present

## 2015-03-12 HISTORY — PX: CARDIAC CATHETERIZATION: SHX172

## 2015-03-12 SURGERY — LEFT HEART CATH
Anesthesia: Moderate Sedation | Laterality: Left

## 2015-03-12 MED ORDER — IOHEXOL 300 MG/ML  SOLN
INTRAMUSCULAR | Status: DC | PRN
  Administered 2015-03-12: 130 mL via INTRA_ARTERIAL

## 2015-03-12 MED ORDER — ISOSORBIDE MONONITRATE ER 30 MG PO TB24: 30.0000 mg | ORAL_TABLET | Freq: Every day | ORAL | Status: DC

## 2015-03-12 MED ORDER — SODIUM CHLORIDE 0.9 % IV SOLN: 250.0000 mL | INTRAVENOUS | Status: DC | PRN

## 2015-03-12 MED ORDER — FENTANYL CITRATE (PF) 100 MCG/2ML IJ SOLN
INTRAMUSCULAR | Status: DC | PRN
  Administered 2015-03-12: 25 ug via INTRAVENOUS

## 2015-03-12 MED ORDER — MIDAZOLAM HCL 2 MG/2ML IJ SOLN
INTRAMUSCULAR | Status: AC
  Filled 2015-03-12: qty 2

## 2015-03-12 MED ORDER — ONDANSETRON HCL 4 MG/2ML IJ SOLN: 4.0000 mg | Freq: Four times a day (QID) | INTRAMUSCULAR | Status: DC | PRN

## 2015-03-12 MED ORDER — SODIUM CHLORIDE 0.9 % IV SOLN
INTRAVENOUS | Status: DC
  Administered 2015-03-12: 08:00:00 via INTRAVENOUS

## 2015-03-12 MED ORDER — FENTANYL CITRATE (PF) 100 MCG/2ML IJ SOLN
INTRAMUSCULAR | Status: AC
  Filled 2015-03-12: qty 2

## 2015-03-12 MED ORDER — VERAPAMIL HCL 2.5 MG/ML IV SOLN
INTRAVENOUS | Status: AC
Start: 2015-03-12 — End: 2015-03-12
  Filled 2015-03-12: qty 2

## 2015-03-12 MED ORDER — HEPARIN (PORCINE) IN NACL 2-0.9 UNIT/ML-% IJ SOLN
INTRAMUSCULAR | Status: AC
  Filled 2015-03-12: qty 1000

## 2015-03-12 MED ORDER — HEPARIN SODIUM (PORCINE) 1000 UNIT/ML IJ SOLN
INTRAMUSCULAR | Status: AC
  Filled 2015-03-12: qty 1

## 2015-03-12 MED ORDER — HEPARIN SODIUM (PORCINE) 1000 UNIT/ML IJ SOLN
INTRAMUSCULAR | Status: DC | PRN
  Administered 2015-03-12: 4000 [IU] via INTRAVENOUS

## 2015-03-12 MED ORDER — METOPROLOL SUCCINATE ER 50 MG PO TB24
25.0000 mg | ORAL_TABLET | Freq: Every day | ORAL | Status: DC
  Administered 2015-03-12: 25 mg via ORAL

## 2015-03-12 MED ORDER — VERAPAMIL HCL 2.5 MG/ML IV SOLN
INTRAVENOUS | Status: DC | PRN
  Administered 2015-03-12: 2.5 mg via INTRA_ARTERIAL

## 2015-03-12 MED ORDER — METOPROLOL SUCCINATE ER 50 MG PO TB24
ORAL_TABLET | ORAL | Status: AC
  Filled 2015-03-12: qty 1

## 2015-03-12 MED ORDER — SODIUM CHLORIDE 0.9 % IJ SOLN: 3.0000 mL | Freq: Two times a day (BID) | INTRAMUSCULAR | Status: DC

## 2015-03-12 MED ORDER — ACETAMINOPHEN 325 MG PO TABS: 650.0000 mg | ORAL_TABLET | ORAL | Status: DC | PRN

## 2015-03-12 MED ORDER — SODIUM CHLORIDE 0.9 % WEIGHT BASED INFUSION: 3.0000 mL/kg/h | INTRAVENOUS | Status: DC

## 2015-03-12 MED ORDER — MIDAZOLAM HCL 2 MG/2ML IJ SOLN
INTRAMUSCULAR | Status: DC | PRN
  Administered 2015-03-12: 0.5 mg via INTRAVENOUS

## 2015-03-12 MED ORDER — SODIUM CHLORIDE 0.9 % IJ SOLN: 3.0000 mL | INTRAMUSCULAR | Status: DC | PRN

## 2015-03-12 SURGICAL SUPPLY — 8 items
CATH 5F 110X4 TIG (CATHETERS) ×3 IMPLANT
CATH INFINITI 5FR ANG PIGTAIL (CATHETERS) ×2 IMPLANT
CATH INFINITI JR4 5F (CATHETERS) ×2 IMPLANT
DEVICE RAD TR BAND REGULAR (VASCULAR PRODUCTS) ×2 IMPLANT
GLIDESHEATH SLEND A-KIT 6F 22G (SHEATH) ×3 IMPLANT
KIT MANI 3VAL PERCEP (MISCELLANEOUS) ×3 IMPLANT
PACK CARDIAC CATH (CUSTOM PROCEDURE TRAY) ×3 IMPLANT
WIRE SAFE-T 1.5MM-J .035X260CM (WIRE) ×3 IMPLANT

## 2015-03-12 NOTE — Progress Notes (Signed)
Pt doing well post cardiac cath, no bleeding nor hematoma at right radial site, with dressing c/d/i, denies complaints, discharge instructions given with quesztions answered, return appt, given, taking po's without difficulty. Vss, ready for discharge

## 2015-03-12 NOTE — Discharge Instructions (Signed)
Coronary Angiogram A coronary angiogram, also called coronary angiography, is an X-ray procedure used to look at the arteries in the heart. In this procedure, a dye (contrast dye) is injected through a long, hollow tube (catheter). The catheter is about the size of a piece of cooked spaghetti and is inserted through your groin, wrist, or arm. The dye is injected into each artery, and X-rays are then taken to show if there is a blockage in the arteries of your heart. LET [facility] CARE PROVIDER KNOW ABOUT:  Any allergies you have, including allergies to shellfish or contrast dye.   All medicines you are taking, including vitamins, herbs, eye drops, creams, and over-the-counter medicines.   Previous problems you or members of your family have had with the use of anesthetics.   Any blood disorders you have.   Previous surgeries you have had.  History of kidney problems or failure.   Other medical conditions you have. RISKS AND COMPLICATIONS  Generally, a coronary angiogram is a safe procedure. However, problems can occur and include:  Allergic reaction to the dye.  Bleeding from the access site or other locations.  Kidney injury, especially in people with impaired kidney function.  Stroke (rare).  Heart attack (rare). BEFORE THE PROCEDURE   Do not eat or drink anything after midnight the night before the procedure or as directed by your health care provider.   Ask your health care provider about changing or stopping your regular medicines. This is especially important if you are taking diabetes medicines or blood thinners. PROCEDURE  You may be given a medicine to help you relax (sedative) before the procedure. This medicine is given through an intravenous (IV) access tube that is inserted into one of your veins.   The area where the catheter will be inserted will be washed and shaved. This is usually done in the groin but may be done in the fold of your arm (near your  elbow) or in the wrist.   A medicine will be given to numb the area where the catheter will be inserted (local anesthetic).   The health care provider will insert the catheter into an artery. The catheter will be guided by using a special type of X-ray (fluoroscopy) of the blood vessel being examined.   A special dye will then be injected into the catheter, and X-rays will be taken. The dye will help to show where any narrowing or blockages are located in the heart arteries.  AFTER THE PROCEDURE   If the procedure is done through the leg, you will be kept in bed lying flat for several hours. You will be instructed to not bend or cross your legs.  The insertion site will be checked frequently.   The pulse in your feet or wrist will be checked frequently.   Additional blood tests, X-rays, and an electrocardiogram may be done.    This information is not intended to replace advice given to you by your health care provider. Make sure you discuss any questions you have with your health care provider.   Document Released: 11/28/2002 Document Revised: 06/14/2014 Document Reviewed: 10/16/2012 Elsevier Interactive Patient Education 2016 [employer]  tr band right radial removed today, over next 24hours keep right arm elevated as much as possible at or above level of heart, keep dressing dry and on right wrist over next 24 hours. May remove dressing tomorrow afternoon, if bleeding or oozing, apply direct pressure at radial site,to stop bleeding, if not seek medical  attention

## 2015-03-12 NOTE — H&P (View-Only) (Signed)
Cardiology Office Note:  Date of Encounter: 03/11/2015  ID: William Mueller, DOB 02/14/1951, MRN [id number]  PCP:  Leonides Sake, MD Primary Cardiologist:  Dr. Rockey Situ, MD  Chief Complaint  Patient presents with  . other    Follow up from [facility] ER; patient was having chest pain. Pt. c/o chest pain at times that comes and goes, mostly in the am.     HPI:  64 year old male with history of CAD with occluded mid RCA s/p PCI/DES, HTN, long standing history of tobacco abuse quitting approximately 5 years ago, COPD, CKD stage II-III, rectal dysfunction, GERD, anxiety on Xanax, daily headache, and history of chest pain who with previous admission to [facility] in 11/2014 for nonexertional chest pain who presents for hospital follow up after recent admission to [facility] from 9/24 to 9/25 for atypical chest pain felt to musculoskeletal in etiology.   He presented to [facility] in 12/2013 with substernal chest pain. He had recently undergone a Myoview 6 weeks prior by outside cardiologist that was reportedly normal. He underwent cardiac cath on 12/12/2013 that showed left main normal, mid LAD diffuse 20%, ostial D1 50%, prox D1 50%, D2 30%, mid LCx moderately calcified with 30%, RCA mildly calcified with diffuse 30% proximally. The vessel was occluded in the midsegment with TIMI 1 flow. Faint left to right collaterals were noted. He underwent successful PCI/DES to the RCA with TIMI 3 flow. Echo showed EF 55-60%, mild LVH, GR1DD, inferior HK, elevated CVP, dilated IVC suggestive of elevated RA pressure.   He was hospitalized again on 02/26/2014 with chest pain. He was seen cardiology and ruled out for MI, had normal ECG and was discharged home. He presented on 05/04/2014 for chest pain. Cardiology saw patient. Cardiac enzymes were negative. ECG with no acute changes. He left [facility]. He presented again on 06/08/2014 for chest pain. Cardiology saw the patient. Cardiac enzymes were  negative. ECG with no acute changes. He had missed a few doses of Brilinta. He underwent Lexiscan Myoview that did not show any reversible ischemia or infarction. LV EF 53%, normal wall motion, low risk stress test. He presented again to the hospital on 11/23/2014 with chest pain, negative troponin, nonspecific T-wave changes on ECG. He was started on Imdur 30 mg daily and advised he could stop Brilinta in July 2016 (12 months post stenting).   In hospital follow up on 12/16/2014 he reported being unable to tolerate Imdur secondary to headaches. Had not been needing SL NTG. No further work up was advised. His wife called on 9/13 stating his BP was "crazy." Though reported reading were 110/77, 140/91, and 103/73, with pulses of 102, 100 respectively. He was noting headache and lower back pain. He was sleeping at that time, and she did not want to wake him. No medications given.   He presented to [facility] on 9/24 with complaints of 2-3 day history of left sided chest pain with associated nausea, and SOB. Troponin nevative x 3, ECG showed NSR, 87 bpm, nonspecific T-wave changes. CXR with no active cardiopulmonary disease. Pain started after starting new medication Zolft leading to tremors and muscle soreness. He was discharged with outpatient cardiology follow up.   He last had left sided chest pain on 10/3. Pain lasted several minutes, and self resolved. He notes considerable fatigue and SOB over the past several months. He has lost 11 pounds since he was last seen here, not trying. At times chest pain is exertional,  then others it is nonexertional. 2 most recent episodes of chest pain (the one that led to his [facility] admission on the most recent) have not felt like any of the previous chest pain admissions above, nor did they feel like his episode back in 12/2013 that led to his PCI. His most recent episode at [facility] was actually reproducible to palpation, but he has continued to have chest pain coupled to fatigue and SOB.  He also notes episodes of belching, previously advised to treat with Coke, but this has has not helped and his symptoms are worsening.      Past Medical History  Diagnosis Date  . Hypertension   . Anxiety   . COPD (chronic obstructive pulmonary disease) ([place])   . High cholesterol   . GERD (gastroesophageal reflux disease)   . Daily headache   . Chronic kidney disease (CKD), stage III (moderate)   . MI (myocardial infarction) ([place])   . Coronary artery disease     a. cath 12/2013: LM nl, mLAD 20%, ost D1 50%, pD1 50%, D2 30%, mLCx 30%, pRCA 30%, mRCA 100% with L to R collats s/p PCI/DES  . Depression   . Diastolic dysfunction     a. echo 12/2013: EF 55-60%, mild LVH, GR1DD, inf HK, elevated CVP, mildly dilated IVC suggestive of increased RA pressure  . Chronic chest pain   :  Past Surgical History  Procedure Laterality Date  . No past surgeries    . Cardiac catheterization      MC x 1 stent  . Coronary angioplasty    . Left heart catheterization with coronary angiogram N/A 12/12/2013    Procedure: LEFT HEART CATHETERIZATION WITH CORONARY ANGIOGRAM;  Surgeon: Wellington Hampshire, MD;  Location: [facility] CATH [facility];  Service: Cardiovascular;  Laterality: N/A;  :  Social History:  The patient  reports that he quit smoking about 4 years ago. His smoking use included Cigarettes and E-cigarettes. He has a 144 pack-year smoking history. He has never used smokeless tobacco. He reports that he drinks about 3.6 oz of alcohol per week. He reports that he does not use illicit drugs.   Family History  Problem Relation Age of Onset  . Coronary artery disease Brother   . Heart disease Brother 12  . Heart disease Father     MI x 8     Allergies:  Allergies  Allergen Reactions  . Benzodiazepines Anaphylaxis and Shortness Of Breath  . Paroxetine Hcl Shortness Of Breath and Palpitations  . Serotonin Reuptake Inhibitors (Ssris) Shortness Of Breath and Palpitations  . Diazepam Other (See Comments)     Rapid heartrate  . Doxycycline Monohydrate Other (See Comments)    Unknown allergic reaction  . Escitalopram Oxalate Other (See Comments)    serotonin Syndrome  . Isosorbide     Feels like head was going to explode  . Lorazepam Other (See Comments)    Unknown allergic reaction  . Tetracyclines & Related Itching and Rash     Home Medications:  Current Outpatient Prescriptions  Medication Sig Dispense Refill  . acetaminophen (TYLENOL) 500 MG tablet Take 1,000 mg by mouth daily as needed for headache.     . ALPRAZolam (XANAX XR) 3 MG 24 hr tablet Take 3 mg by mouth every evening. For anxiety control    . ALPRAZolam (XANAX) 1 MG tablet Take 1 mg by mouth daily.     Marland Kitchen aspirin EC 81 MG EC tablet Take 1 tablet (81 mg total) by mouth  daily. 30 tablet 0  . budesonide-formoterol (SYMBICORT) 160-4.5 MCG/ACT inhaler Inhale 2 puffs into the lungs 2 (two) times daily.    . metoprolol succinate (TOPROL-XL) 25 MG 24 hr tablet Take 1 tablet (25 mg total) by mouth daily. 30 tablet 6  . nitroGLYCERIN (NITROSTAT) 0.4 MG SL tablet Place 1 tablet (0.4 mg total) under the tongue every 5 (five) minutes as needed for chest pain. Call 911 after 3rd tablet 25 tablet 3  . omeprazole (PRILOSEC) 40 MG capsule Take 1 capsule (40 mg total) by mouth daily. 30 capsule 11  . pravastatin (PRAVACHOL) 80 MG tablet TAKE ONE TABLET BY MOUTH AT BEDTIME 30 tablet 3  . roflumilast (DALIRESP) 500 MCG TABS tablet Take 500 mcg by mouth daily.    . ticagrelor (BRILINTA) 60 MG TABS tablet Take 1 tablet (60 mg total) by mouth 2 (two) times daily. 60 tablet 11  . tiotropium (SPIRIVA) 18 MCG inhalation capsule Place 18 mcg into inhaler and inhale daily.     No current facility-administered medications for this visit.     Review of Systems:  Review of Systems  Constitutional: Positive for malaise/fatigue and diaphoresis. Negative for fever, chills and weight loss.  HENT: Negative for congestion.   Eyes: Negative for discharge  and redness.  Respiratory: Positive for cough and shortness of breath. Negative for hemoptysis, sputum production and wheezing.   Cardiovascular: Positive for chest pain, orthopnea and leg swelling. Negative for palpitations, claudication and PND.  Gastrointestinal: Negative for heartburn, nausea and vomiting.  Musculoskeletal: Negative for falls.  Skin: Negative for rash.  Neurological: Positive for weakness. Negative for dizziness, tingling, tremors, sensory change, speech change, focal weakness, seizures and loss of consciousness.  Endo/Heme/Allergies: Does not bruise/bleed easily.  Psychiatric/Behavioral: Negative for substance abuse. The patient is not nervous/anxious.   All other systems reviewed and are negative.    Physical Exam:  Blood pressure 130/82, pulse 83, height 5\' 9"  (1.753 m), weight 175 lb 12 oz (79.72 kg). BMI: Body mass index is 25.94 kg/(m^2). General: Pleasant, NAD. Psych: Normal affect. Responds to questions with normal affect.  Neuro: Alert and oriented X 3. Moves all extremities spontaneously. HEENT: Normocephalic, atraumatic. EOM intact. Sclera anicteric.  Neck: Trachea midline. Supple without bruits or JVD. Lungs:  Respirations regular and unlabored. CTA bilaterally without wheezing, crackles, or rhonchi.  Heart: RRR, normal s3, s4. No murmurs, rubs, or gallops.  Abdomen: Soft, non-tender, non-distended, BS + x 4.  Extremities: No clubbing, cyanosis or edema. DP/PT/Radials 2+ and equal bilaterally.   Accessory Clinical Findings:  EKG: NSR, 83 bpm, nonspecific st/t change lead I, TWI aVL, 1 mm flat st depression V5, 0.5 mm flat st depression V6  Recent Labs: 11/22/2014: B Natriuretic Peptide 10.6 11/23/2014: ALT 26 03/01/2015: BUN 14; Creatinine, Ser 1.61*; Hemoglobin 14.6; Platelets 267; Potassium 4.2; Sodium 135  No results found for requested labs within last 365 days.  Estimated Creatinine Clearance: 45.7 mL/min (by C-G formula based on Cr of  1.61).  Weights: Wt Readings from Last 3 Encounters:  03/11/15 175 lb 12 oz (79.72 kg)  03/01/15 173 lb 14.4 oz (78.881 kg)  12/16/14 182 lb 4 oz (82.668 kg)    Other studies Reviewed: Additional studies/ records that were reviewed today include: prior hospital admissions and office notes.  Assessment & Plan:  1. Unstable angina/class III angina/abnormal EKG/CAD s/p PCI as above: -Schedule patient for cardiac catheterization on 10/5 with Dr. Ellyn Hack, MD -Patient has previously undergone stress testing x 2, once in  the summer of 2015 that was reportedly normal and was followed up a short while later with recurrent angina leading to the above cardiac cath s/p PCI. His most recent stress test in 06/2014 was also low risk. It is doubtful a stress test in this situation would be of much benefit  -Given his prior cardiac cath and symptomology there is concern for possible worsening of coronary disease  -Continue aspirin 81 mg and Brilinta 60 mg bid -Continue Toprol XL 25 mg daily and pravastatin 80 mg, consider changing to high intensity statin  -Check echo -Risks and benefits of cardiac catheterization have been discussed with the patient including risks of bleeding, bruising, infection, kidney damage, stroke, heart attack, possible need for urgent bypass, and death. The patient understands these risks and is willing to proceed with the procedure. All questions have been answered and concerns listened to.   2. COPD: -Stable -No active flare  3. CKD stage III: -Most recent SCr 1.6 -Limit contrast -If PCI is indicated, may need to perform staged procedure over two days with hydration in between  -Renal function pending  4. HLD: -Not at goal -Consider high intensity statin as above when he can afford this -Currently on pravastatin   5. HTN: -Controlled -Continue Toprol XL 25 mg daily   Dispo: -Follow up post cardiac cath  Current medicines are reviewed at length with the patient  today.  The patient did not have any concerns regarding medicines.   Christell Faith, PA-C [facility] [place], [place] [address] [phone] [place] 03/11/2015, 2:39 PM

## 2015-03-12 NOTE — Interval H&P Note (Signed)
History and Physical Interval Note:  03/12/2015 8:34 AM  William Mueller  has presented today for surgery, with the diagnosis of Chest pain - CLASS III UNSTABLE ANGINA.   The various methods of treatment have been discussed with the patient and family.  After consideration of risks, benefits and other options for treatment, the patient has consented to  Procedure(s): Left Heart Cath (Left) as a surgical intervention .    The patient's history has been reviewed, patient examined, no change in status, stable for surgery.  I have reviewed the patient's chart and labs.  Questions were answered to the patient's satisfaction.     [place], [place]  Cath Lab Visit (complete for each Cath Lab visit)  Clinical Evaluation Leading to the Procedure:   ACS: No.  Non-ACS:    Anginal Classification: CCS III  Anti-ischemic medical therapy: Minimal Therapy (1 class of medications)  Non-Invasive Test Results: No non-invasive testing performed  Prior CABG: No previous CABG   Ischemic Symptoms? CCS III (Marked limitation of ordinary activity) Anti-ischemic Medical Therapy? Minimal Therapy (1 class of medications) Non-invasive Test Results? No non-invasive testing performed Prior CABG? No Previous CABG   Patient Information:   1-2V CAD, no prox LAD  A (7)  Indication: 20; Score: 7   Patient Information:   1-2V-CAD with DS 50-60% With No FFR, No IVUS  I (3)  Indication: 21; Score: 3   Patient Information:   1-2V-CAD with DS 50-60% With FFR  A (7)  Indication: 22; Score: 7   Patient Information:   1-2V-CAD with DS 50-60% With FFR>0.8, IVUS not significant  I (2)  Indication: 23; Score: 2   Patient Information:   3V-CAD without LMCA With Abnormal LV systolic function  A (9)  Indication: 48; Score: 9   Patient Information:   LMCA-CAD  A (9)  Indication: 49; Score: 9   Patient Information:   2V-CAD with prox LAD PCI  A (7)  Indication: 62; Score:  7   Patient Information:   2V-CAD with prox LAD CABG  A (8)  Indication: 62; Score: 8   Patient Information:   3V-CAD without LMCA With Low CAD burden(i.e., 3 focal stenoses, low SYNTAX score) PCI  A (7)  Indication: 63; Score: 7   Patient Information:   3V-CAD without LMCA With Low CAD burden(i.e., 3 focal stenoses, low SYNTAX score) CABG  A (9)  Indication: 63; Score: 9   Patient Information:   3V-CAD without LMCA E06c - Intermediate-high CAD burden (i.e., multiple diffuse lesions, presence of CTO, or high SYNTAX score) PCI  U (4)  Indication: 64; Score: 4   Patient Information:   3V-CAD without LMCA E06c - Intermediate-high CAD burden (i.e., multiple diffuse lesions, presence of CTO, or high SYNTAX score) CABG  A (9)  Indication: 64; Score: 9   Patient Information:   LMCA-CAD With Isolated LMCA stenosis  PCI  U (6)  Indication: 65; Score: 6   Patient Information:   LMCA-CAD With Isolated LMCA stenosis  CABG  A (9)  Indication: 65; Score: 9   Patient Information:   LMCA-CAD Additional CAD, low CAD burden (i.e., 1- to 2-vessel additional involvement, low SYNTAX score) PCI  U (5)  Indication: 66; Score: 5   Patient Information:   LMCA-CAD Additional CAD, low CAD burden (i.e., 1- to 2-vessel additional involvement, low SYNTAX score) CABG  A (9)  Indication: 66; Score: 9   Patient Information:   LMCA-CAD Additional CAD, intermediate-high CAD burden (i.e.,  3-vessel involvement, presence of CTO, or high SYNTAX score) PCI  I (3)  Indication: 67; Score: 3   Patient Information:   LMCA-CAD Additional CAD, intermediate-high CAD burden (i.e., 3-vessel involvement, presence of CTO, or high SYNTAX score) CABG  A (9)  Indication: 67; Score: 9   Kelena Garrow W, M.D., M.S. Interventional Cardiologist   Pager # [phone]

## 2015-03-14 ENCOUNTER — Telehealth: Payer: Self-pay | Admitting: *Deleted

## 2015-03-14 NOTE — Telephone Encounter (Signed)
Ranexa 500 mg samples left at the front desk for pt to p/u at his convenience.

## 2015-03-14 NOTE — Telephone Encounter (Signed)
Can use Ranexa 500 mg bid. Can supply samples/coupon if we have any. Stomach ache is likely in the setting of severe HA. Advance fluids/diet slowly throughout the day.

## 2015-03-14 NOTE — Telephone Encounter (Signed)
Spoke w/ pt's wife.  Advised her of Ryan's recommendation. The phone cut out during our conversation.  Asked her to call back if she could hear me.

## 2015-03-14 NOTE — Telephone Encounter (Signed)
Pt wife calling stating after cath done on the 5th and they gave him medication to take home with and start Pt is not able to take Isosorbide, he took it but 330am he felt like his head is going to fall off He has a really bad headache and can't do much He is also having a upset stomach Would like to know what to do Please advise .

## 2015-03-27 ENCOUNTER — Encounter: Payer: Self-pay | Admitting: Nurse Practitioner

## 2015-03-27 ENCOUNTER — Ambulatory Visit (INDEPENDENT_AMBULATORY_CARE_PROVIDER_SITE_OTHER): Payer: PPO | Admitting: Nurse Practitioner

## 2015-03-27 VITALS — BP 130/88 | HR 76 | Ht 69.0 in | Wt 174.5 lb

## 2015-03-27 DIAGNOSIS — F17200 Nicotine dependence, unspecified, uncomplicated: Secondary | ICD-10-CM | POA: Insufficient documentation

## 2015-03-27 DIAGNOSIS — F17298 Nicotine dependence, other tobacco product, with other nicotine-induced disorders: Secondary | ICD-10-CM | POA: Diagnosis not present

## 2015-03-27 DIAGNOSIS — I25118 Atherosclerotic heart disease of native coronary artery with other forms of angina pectoris: Secondary | ICD-10-CM | POA: Diagnosis not present

## 2015-03-27 DIAGNOSIS — I471 Supraventricular tachycardia: Secondary | ICD-10-CM

## 2015-03-27 DIAGNOSIS — E785 Hyperlipidemia, unspecified: Secondary | ICD-10-CM | POA: Diagnosis not present

## 2015-03-27 DIAGNOSIS — I10 Essential (primary) hypertension: Secondary | ICD-10-CM | POA: Diagnosis not present

## 2015-03-27 NOTE — Progress Notes (Signed)
Patient Name: William Mueller Date of Encounter: 03/27/2015  Primary Care Provider:  Leonides Sake, MD Primary Cardiologist:  Johnny Bridge, MD   Chief Complaint  64 year old male with a history of coronary artery disease and persistent chest pain, who recently underwent diagnostic catheterization revealing small vessel disease.  Past Medical History   Past Medical History  Diagnosis Date  . Hypertension   . Anxiety   . COPD (chronic obstructive pulmonary disease) ([place])   . High cholesterol   . GERD (gastroesophageal reflux disease)   . Daily headache   . Chronic kidney disease (CKD), stage III (moderate)   . Coronary artery disease     a. 12/2013 PCI: mRCA 100% with L to R collats s/p PCI/DES;  b. 06/2014 MV: no ischemia/infarct, EF 53%;  c. 03/2015 Cath: LM nl, LAD nl, D1 80 (1.59mm), LCX 85m (<1.69mm), OM1 nl, RCA 55p, patent stent, RPDA nl, EF 65%.  . Depression   . Diastolic dysfunction     a. echo 12/2013: EF 55-60%, mild LVH, GR1DD, inf HK, elevated CVP, mildly dilated IVC suggestive of increased RA pressure  . Chronic chest pain   . Nicotine addiction     a. using eCigs.   Past Surgical History  Procedure Laterality Date  . No past surgeries    . Cardiac catheterization      MC x 1 stent  . Coronary angioplasty    . Left heart catheterization with coronary angiogram N/A 12/12/2013    Procedure: LEFT HEART CATHETERIZATION WITH CORONARY ANGIOGRAM;  Surgeon: Wellington Hampshire, MD;  Location: [facility];  Service: Cardiovascular;  Laterality: N/A;  . Cardiac catheterization Left 03/12/2015    Procedure: Left Heart Cath and Coronary Angiography;  Surgeon: Leonie Man, MD;  Location: [facility];  Service: Cardiovascular;  Laterality: Left;    Allergies  Allergies  Allergen Reactions  . Benzodiazepines Shortness Of Breath    Patient is on Xanax  . Paroxetine Hcl Shortness Of Breath and Palpitations  . Serotonin Reuptake Inhibitors (Ssris) Shortness Of Breath  and Palpitations  . Diazepam Other (See Comments)    Rapid heartrate  . Doxycycline Monohydrate Other (See Comments)    Unknown allergic reaction  . Escitalopram Oxalate Other (See Comments)    serotonin Syndrome  . Isosorbide Other (See Comments)    Feels like head was going to explode -- HA  . Lorazepam Other (See Comments)    Unknown allergic reaction  . Tetracyclines & Related Itching and Rash    HPI  64 year old male with the above complex problem list. He has a known prior history of right coronary artery disease status post prior stenting. He was recently seen in clinic with complaints of ongoing intermittent chest discomfort. He underwent diagnostic catheterization at [facility] revealing severe left circumflex and first diagonal disease with a patent RCA stent. The circumflex and diagonal were felt to be too small for intervention, each being less than approximately 1.5 mm in diameter. Medical therapy was recommended. Initially, Imdur was added to his regimen however this caused significant headache. Ranexa was then added as an outpatient. Unfortunately, he says that he has been having low energy ever since starting Ranexa. He has not been having chest pain or dyspnea. He would like to come off of Ranexa for a few days to see if he feels better. He denies PND, orthopnea, dizziness, syncope, edema, or early satiety.  Home Medications  Prior to Admission medications   Medication Sig  Start Date End Date Taking? Authorizing Provider  acetaminophen (TYLENOL) 500 MG tablet Take 1,000 mg by mouth daily as needed for headache.    Yes Historical Provider, MD  ALPRAZolam (XANAX XR) 3 MG 24 hr tablet Take 3 mg by mouth every evening. For anxiety control   Yes Historical Provider, MD  ALPRAZolam Duanne Moron) 1 MG tablet Take 1 mg by mouth daily.  11/21/13  Yes Historical Provider, MD  aspirin EC 81 MG EC tablet Take 1 tablet (81 mg total) by mouth daily. 12/14/13  Yes Karlene Einstein, MD    budesonide-formoterol [facility]) 160-4.5 MCG/ACT inhaler Inhale 2 puffs into the lungs 2 (two) times daily.   Yes Historical Provider, MD  isosorbide mononitrate (IMDUR) 30 MG 24 hr tablet Take 1 tablet (30 mg total) by mouth daily. 03/12/15  Yes Leonie Man, MD  metoprolol succinate (TOPROL-XL) 25 MG 24 hr tablet Take 1 tablet (25 mg total) by mouth daily. 11/23/14  Yes Debbe Odea, MD  nitroGLYCERIN (NITROSTAT) 0.4 MG SL tablet Place 1 tablet (0.4 mg total) under the tongue every 5 (five) minutes as needed for chest pain. Call 911 after 3rd tablet 12/16/14  Yes Minna Merritts, MD  omeprazole (PRILOSEC) 40 MG capsule Take 1 capsule (40 mg total) by mouth daily. 12/16/14  Yes Minna Merritts, MD  pravastatin (PRAVACHOL) 80 MG tablet TAKE ONE TABLET BY MOUTH AT BEDTIME 01/20/15  Yes Minna Merritts, MD  roflumilast (DALIRESP) 500 MCG TABS tablet Take 500 mcg by mouth daily.   Yes Historical Provider, MD  ticagrelor (BRILINTA) 60 MG TABS tablet Take 1 tablet (60 mg total) by mouth 2 (two) times daily. 12/16/14  Yes Minna Merritts, MD  tiotropium (SPIRIVA) 18 MCG inhalation capsule Place 18 mcg into inhaler and inhale daily.   Yes Historical Provider, MD    Review of Systems  As above, intermittent chest discomfort with a feeling of relatively low energy.  He denies chest palpitations, dyspnea, pnd, orthopnea, n, v, dizziness, syncope, edema, weight gain, or early satiety.  All other systems reviewed and are otherwise negative except as noted above.  Physical Exam  VS:  BP 130/88 mmHg  Pulse 76  Ht 5\' 9"  (1.753 m)  Wt 174 lb 8 oz (79.153 kg)  BMI 25.76 kg/m2 , BMI Body mass index is 25.76 kg/(m^2). GEN: Well nourished, well developed, in no acute distress. HEENT: normal. Neck: Supple, no JVD, carotid bruits, or masses. Cardiac: RRR, no murmurs, rubs, or gallops. No clubbing, cyanosis, edema.  Radials/DP/PT 2+ and equal bilaterally. R wrist cath site w/o  bleeding/bruit/hematoma. Respiratory:  Respirations regular and unlabored, diminished breath sounds bilat. GI: Soft, nontender, nondistended, BS + x 4. MS: no deformity or atrophy. Skin: warm and dry, no rash. Neuro:  Strength and sensation are intact. Psych: Normal affect.  Accessory Clinical Findings  ECG - RSR, 78, no acute st/t changes.  Assessment & Plan  1.  Coronary artery disease with intermittent angina: Patient is status post recent diagnostic catheterization revealing severe small vessel coronary artery disease. He was initially tried on isosorbide mononitrate, however this resulted in significant headaches. He was then placed on Ranexa and has been feeling fatigued with no energy. He thinks this is worsened since Ranexa was started. He is going to hold Ranexa for a few days to see if his symptoms improve. I also suspect the deconditioning may be playing a role as he says he's been laying around more less since the spring. I  recommended that he begin walking, understanding that he may experience Chest pain and at that point he'll need to rest and consider taking nitroglycerin as needed. Continue aspirin, beta blocker, statin, and brilinta (reduced dose).  2.  Essential HTN:  Stable.  3.  HL:  Cont statin therapy.  4.  Nicotine Addiction:  Previously smoked cigarettes, now using eCigarettes.  We discussed the paucity of date regarding long term safety of these products.  5.  Dispo:  F/u Dr. Rockey Situ in 2 mos.   Murray Hodgkins, NP 03/27/2015, 3:20 PM

## 2015-03-27 NOTE — Patient Instructions (Signed)
Medication Instructions:  Your physician has recommended you make the following change in your medication:  Cic[place] for a few days  Labwork: none  Testing/Procedures: none  Follow-Up: Your physician recommends that you schedule a follow-up appointment in: 2 months with Christell Faith, PA-C or Dr. Rockey Situ   Any Other Special Instructions Will Be Listed Below (If Applicable).

## 2015-04-08 ENCOUNTER — Ambulatory Visit (INDEPENDENT_AMBULATORY_CARE_PROVIDER_SITE_OTHER): Payer: PPO

## 2015-04-08 ENCOUNTER — Other Ambulatory Visit: Payer: Self-pay

## 2015-04-08 ENCOUNTER — Telehealth: Payer: Self-pay | Admitting: Cardiovascular Disease

## 2015-04-08 DIAGNOSIS — R079 Chest pain, unspecified: Secondary | ICD-10-CM

## 2015-04-08 NOTE — Telephone Encounter (Signed)
Left message on pt's vm that we use the [employer] Fluarix Quadrivalent flu shot.  Asked him to call back if he would like to schedule an appt for flu shot.

## 2015-04-08 NOTE — Telephone Encounter (Signed)
Patient wants to know if flu vaccine is 4 :1 vaccine.  Please call to let him know.  Advised patient to also check with PCP as they have flu clinics and we have only a few vaccines available in office

## 2015-04-17 ENCOUNTER — Encounter: Payer: Self-pay | Admitting: Internal Medicine

## 2015-04-28 ENCOUNTER — Ambulatory Visit: Payer: PPO | Admitting: Cardiovascular Disease

## 2015-04-29 ENCOUNTER — Telehealth: Payer: Self-pay

## 2015-04-29 MED ORDER — RANOLAZINE ER 500 MG PO TB12: 500.0000 mg | ORAL_TABLET | Freq: Two times a day (BID) | ORAL | Status: DC

## 2015-04-29 NOTE — Telephone Encounter (Signed)
I asked him to hold ranexa b/c he thought it was causing him to feel fatigued, though I thought deconditioning was likely the culprit.  If his Ss did not get better off of ranexa, then it's ok for him to resume it.  Murray Hodgkins, NP

## 2015-04-29 NOTE — Telephone Encounter (Signed)
Pt would like Ranexa samples please call.

## 2015-04-29 NOTE — Telephone Encounter (Signed)
Called pt and spoke with pt's wife William Mueller informing her that Ranexa is not on the pt's med list and that I would have to sent a request to Murray Hodgkins, NP that pt saw on 03/27/15 and Mr. Sharolyn Douglas, NP stated for pt to hold the ranexa for a few days. Pt is requesting samples of Ranexa. Please advise

## 2015-04-29 NOTE — Telephone Encounter (Signed)
Called pt and spoke with pt's wife [name], informing her that we do not have any samples of Ranexa 500 mg tablets and that they could call back later today to see if we have gotten some samples in. I advised the wife that if the pt has any other problems, questions or concerns to call the office. Wife verbalized understanding.

## 2015-05-20 ENCOUNTER — Other Ambulatory Visit: Payer: Self-pay | Admitting: Cardiovascular Disease

## 2015-05-27 ENCOUNTER — Ambulatory Visit (INDEPENDENT_AMBULATORY_CARE_PROVIDER_SITE_OTHER): Payer: PPO | Admitting: Cardiovascular Disease

## 2015-05-27 ENCOUNTER — Encounter: Payer: Self-pay | Admitting: Cardiovascular Disease

## 2015-05-27 VITALS — BP 140/100 | HR 73 | Ht 69.0 in | Wt 183.0 lb

## 2015-05-27 DIAGNOSIS — I471 Supraventricular tachycardia: Secondary | ICD-10-CM

## 2015-05-27 DIAGNOSIS — I10 Essential (primary) hypertension: Secondary | ICD-10-CM

## 2015-05-27 DIAGNOSIS — F17298 Nicotine dependence, other tobacco product, with other nicotine-induced disorders: Secondary | ICD-10-CM

## 2015-05-27 DIAGNOSIS — I200 Unstable angina: Secondary | ICD-10-CM | POA: Diagnosis not present

## 2015-05-27 DIAGNOSIS — E785 Hyperlipidemia, unspecified: Secondary | ICD-10-CM

## 2015-05-27 DIAGNOSIS — I214 Non-ST elevation (NSTEMI) myocardial infarction: Secondary | ICD-10-CM

## 2015-05-27 DIAGNOSIS — I4719 Other supraventricular tachycardia: Secondary | ICD-10-CM

## 2015-05-27 DIAGNOSIS — N183 Chronic kidney disease, stage 3 unspecified: Secondary | ICD-10-CM

## 2015-05-27 DIAGNOSIS — J432 Centrilobular emphysema: Secondary | ICD-10-CM

## 2015-05-27 MED ORDER — HYDRALAZINE HCL 25 MG PO TABS: 25.0000 mg | ORAL_TABLET | Freq: Three times a day (TID) | ORAL | Status: DC | PRN

## 2015-05-27 NOTE — Progress Notes (Signed)
Patient ID: William Mueller, male    DOB: Mar 13, 1951, 64 y.o.   MRN: [id number]  HPI Comments:  Mr. Schmahl is a very pleasant 64 year old gentleman with a long history of smoking, COPD, chronic renal insufficiency, creatinine 1.9,  who presented 12/11/2013 to [facility] with chest pain. He was taken to the cardiac catheterization lab where he was found to have an occluded mid RCA. Stent was placed. Repeat catheterization in October 2016 with patent stent, but did have disease of a small diagonal and distal circumflex not amenable to intervention. He presents today for follow-up of his coronary artery disease  In follow-up today, he is concerned by the statement from Dr. Raul Del that he has stage IV COPD He is not on oxygen, denies any hypoxia even when he measures his oxygen saturation on ambulation, Exercises, walks 1/2 mile at a time without having to stop Reports he did a PFT, technician reported that he passed out. He does not remember any significant difficulty but the test was abruptly stopped  Unable to tolerate isosorbide secondary to headache Continues to have vague chest discomfort at times, does not take sublingual nitroglycerin Blood pressure elevated today, diastolic high, has not been measuring at home No recent lipid panel available previously total cholesterol was 200  EKG on today's visit shows normal sinus rhythm rate 74 bpm, no significant ST or T-wave changes noted, artifacts noted  Other past medical history he presented to the hospital 02/26/2014 with chest pain. He seen by cardiology, rule out for MI, had normal EKG and discharged home. Echocardiogram July 2015 showed ejection fraction 55-60%  Creatinine in 10/22/2013 was 2.1   Allergies  Allergen Reactions  . Benzodiazepines Shortness Of Breath    Patient is on Xanax  . Paroxetine Hcl Shortness Of Breath and Palpitations  . Serotonin Reuptake Inhibitors (Ssris) Shortness Of Breath and Palpitations  .  Diazepam Other (See Comments)    Rapid heartrate  . Doxycycline Monohydrate Other (See Comments)    Unknown allergic reaction  . Escitalopram Oxalate Other (See Comments)    serotonin Syndrome  . Isosorbide Other (See Comments)    Feels like head was going to explode -- HA  . Lorazepam Other (See Comments)    Unknown allergic reaction  . Tetracyclines & Related Itching and Rash    Outpatient Encounter Prescriptions as of 05/27/2015  Medication Sig  . acetaminophen (TYLENOL) 500 MG tablet Take 1,000 mg by mouth daily as needed for headache.   . ALPRAZolam (XANAX XR) 3 MG 24 hr tablet Take 3 mg by mouth every evening. For anxiety control  . ALPRAZolam (XANAX) 1 MG tablet Take 1 mg by mouth daily.   Marland Kitchen aspirin EC 81 MG EC tablet Take 1 tablet (81 mg total) by mouth daily.  . budesonide-formoterol (SYMBICORT) 160-4.5 MCG/ACT inhaler Inhale 2 puffs into the lungs 2 (two) times daily.  . metoprolol succinate (TOPROL-XL) 25 MG 24 hr tablet Take 1 tablet (25 mg total) by mouth daily.  . nitroGLYCERIN (NITROSTAT) 0.4 MG SL tablet Place 1 tablet (0.4 mg total) under the tongue every 5 (five) minutes as needed for chest pain. Call 911 after 3rd tablet  . omeprazole (PRILOSEC) 40 MG capsule Take 1 capsule (40 mg total) by mouth daily.  . pravastatin (PRAVACHOL) 80 MG tablet TAKE ONE TABLET BY MOUTH AT BEDTIME  . ranolazine (RANEXA) 500 MG 12 hr tablet Take 1 tablet (500 mg total) by mouth 2 (two) times daily. (Patient taking differently:  Take 500 mg by mouth daily. )  . roflumilast (DALIRESP) 500 MCG TABS tablet Take 500 mcg by mouth daily.  . ticagrelor (BRILINTA) 60 MG TABS tablet Take 1 tablet (60 mg total) by mouth 2 (two) times daily.  Marland Kitchen tiotropium (SPIRIVA) 18 MCG inhalation capsule Place 18 mcg into inhaler and inhale daily.  . hydrALAZINE (APRESOLINE) 25 MG tablet Take 1 tablet (25 mg total) by mouth 3 (three) times daily as needed.  . [DISCONTINUED] isosorbide mononitrate (IMDUR) 30 MG 24 hr  tablet Take 1 tablet (30 mg total) by mouth daily. (Patient not taking: Reported on 05/27/2015)   No facility-administered encounter medications on file as of 05/27/2015.    Past Medical History  Diagnosis Date  . Hypertension   . Anxiety   . COPD (chronic obstructive pulmonary disease) ([place])   . High cholesterol   . GERD (gastroesophageal reflux disease)   . Daily headache   . Chronic kidney disease (CKD), stage III (moderate)   . Coronary artery disease     a. 12/2013 PCI: mRCA 100% with L to R collats s/p PCI/DES;  b. 06/2014 MV: no ischemia/infarct, EF 53%;  c. 03/2015 Cath: LM nl, LAD nl, D1 80 (1.19mm), LCX 24m (<1.58mm), OM1 nl, RCA 55p, patent stent, RPDA nl, EF 65%.  . Depression   . Diastolic dysfunction     a. echo 12/2013: EF 55-60%, mild LVH, GR1DD, inf HK, elevated CVP, mildly dilated IVC suggestive of increased RA pressure  . Chronic chest pain   . Nicotine addiction     a. using eCigs.    Past Surgical History  Procedure Laterality Date  . No past surgeries    . Cardiac catheterization      MC x 1 stent  . Coronary angioplasty    . Left heart catheterization with coronary angiogram N/A 12/12/2013    Procedure: LEFT HEART CATHETERIZATION WITH CORONARY ANGIOGRAM;  Surgeon: Wellington Hampshire, MD;  Location: [facility];  Service: Cardiovascular;  Laterality: N/A;  . Cardiac catheterization Left 03/12/2015    Procedure: Left Heart Cath and Coronary Angiography;  Surgeon: Leonie Man, MD;  Location: [facility];  Service: Cardiovascular;  Laterality: Left;    Social History  reports that he quit smoking about 4 years ago. His smoking use included Cigarettes and E-cigarettes. He has a 144 pack-year smoking history. He has never used smokeless tobacco. He reports that he drinks about 3.6 oz of alcohol per week. He reports that he does not use illicit drugs.  Family History family history includes Coronary artery disease in his brother; Heart disease in his father;  Heart disease (age of onset: 18) in his brother.   Review of Systems  Constitutional: Negative.   Respiratory: Positive for shortness of breath.   Cardiovascular: Positive for chest pain.  Gastrointestinal: Negative.   Musculoskeletal: Negative.   Skin: Negative.   Neurological: Negative.   Hematological: Negative.   Psychiatric/Behavioral: The patient is nervous/anxious.   All other systems reviewed and are negative.   BP 140/100 mmHg  Pulse 73  Ht 5\' 9"  (1.753 m)  Wt 183 lb (83.008 kg)  BMI 27.01 kg/m2  Physical Exam  Constitutional: He is oriented to person, place, and time. He appears well-developed and well-nourished.  HENT:  Head: Normocephalic.  Nose: Nose normal.  Mouth/Throat: Oropharynx is clear and moist.  Eyes: Conjunctivae are normal. Pupils are equal, round, and reactive to light.  Neck: Normal range of motion. Neck supple. No JVD  present.  Cardiovascular: Normal rate, regular rhythm, S1 normal, S2 normal, normal heart sounds and intact distal pulses.  Exam reveals no gallop and no friction rub.   No murmur heard. Pulmonary/Chest: Effort normal. No respiratory distress. He has decreased breath sounds. He has no wheezes. He has no rales. He exhibits no tenderness.  Abdominal: Soft. Bowel sounds are normal. He exhibits no distension. There is no tenderness.  Musculoskeletal: Normal range of motion. He exhibits no edema or tenderness.  Lymphadenopathy:    He has no cervical adenopathy.  Neurological: He is alert and oriented to person, place, and time. Coordination normal.  Skin: Skin is warm and dry. No rash noted. No erythema.  Psychiatric: He has a normal mood and affect. His behavior is normal. Judgment and thought content normal.      Assessment and Plan   Nursing note and vitals reviewed.

## 2015-05-27 NOTE — Assessment & Plan Note (Signed)
Baseline creatinine 1.9 up to 2 Managed by nephrology

## 2015-05-27 NOTE — Assessment & Plan Note (Signed)
Notes from pulmonary suggest he has stage IV COPD. Clinically he appears better than described No PFTs, and relating without oxygen, exercising daily, no hypoxia noted

## 2015-05-27 NOTE — Patient Instructions (Addendum)
You are doing well.  We will check lipids and LFTs (fasting)  Date & time: ______________________  Please monitor your bottom number on your blood pressure  Please take hydralazine as needed (lasts only 6 hours) for high blood pressure  Please call us if you have new issues that need to be addressed before your next appt.  Your physician wants you to follow-up in: 6 months.  You will receive a reminder letter in the mail two months in advance. If you don't receive a letter, please call our office to schedule the follow-up appointment.

## 2015-05-27 NOTE — Assessment & Plan Note (Signed)
We have encouraged him to continue to work on weaning his cigarettes and smoking cessation. He will continue to work on this and does not want any assistance with chantix.  

## 2015-05-27 NOTE — Assessment & Plan Note (Signed)
Continues to have rare episodes of chest discomfort, typically not on exertion He does have small vessel disease not amenable to intervention

## 2015-05-27 NOTE — Assessment & Plan Note (Signed)
Recommended he monitor his diastolic pressure at home If this runs high, suggested he take hydralazine 25 mg when necessary If he continues to run high, recommended he call our office, could potentially start additional medication such as low-dose amlodipine

## 2015-05-27 NOTE — Assessment & Plan Note (Signed)
Recent catheterization October 2016 with patent RCA stent

## 2015-05-28 ENCOUNTER — Other Ambulatory Visit (INDEPENDENT_AMBULATORY_CARE_PROVIDER_SITE_OTHER): Payer: PPO | Admitting: *Deleted

## 2015-05-28 DIAGNOSIS — E785 Hyperlipidemia, unspecified: Secondary | ICD-10-CM

## 2015-05-28 DIAGNOSIS — I471 Supraventricular tachycardia: Secondary | ICD-10-CM

## 2015-05-28 DIAGNOSIS — I10 Essential (primary) hypertension: Secondary | ICD-10-CM

## 2015-05-29 LAB — HEPATIC FUNCTION PANEL
ALK PHOS: 92 IU/L (ref 39–117)
ALT: 18 IU/L (ref 0–44)
AST: 19 IU/L (ref 0–40)
Albumin: 4.3 g/dL (ref 3.6–4.8)
BILIRUBIN TOTAL: 0.8 mg/dL (ref 0.0–1.2)
BILIRUBIN, DIRECT: 0.23 mg/dL (ref 0.00–0.40)
Total Protein: 6.6 g/dL (ref 6.0–8.5)

## 2015-05-29 LAB — LIPID PANEL
Chol/HDL Ratio: 2.9 ratio units (ref 0.0–5.0)
Cholesterol, Total: 156 mg/dL (ref 100–199)
HDL: 54 mg/dL (ref >39)
LDL Calculated: 73 mg/dL (ref 0–99)
Triglycerides: 146 mg/dL (ref 0–149)
VLDL Cholesterol Cal: 29 mg/dL (ref 5–40)

## 2015-06-05 ENCOUNTER — Other Ambulatory Visit: Payer: Self-pay

## 2015-06-05 MED ORDER — EZETIMIBE 10 MG PO TABS: 10.0000 mg | ORAL_TABLET | Freq: Every day | ORAL | Status: DC

## 2015-06-06 ENCOUNTER — Encounter: Payer: Self-pay | Admitting: Internal Medicine

## 2015-06-06 ENCOUNTER — Ambulatory Visit (INDEPENDENT_AMBULATORY_CARE_PROVIDER_SITE_OTHER): Payer: PPO | Admitting: Internal Medicine

## 2015-06-06 VITALS — BP 140/82 | HR 77 | Ht 69.0 in | Wt 184.6 lb

## 2015-06-06 DIAGNOSIS — F1910 Other psychoactive substance abuse, uncomplicated: Secondary | ICD-10-CM | POA: Diagnosis not present

## 2015-06-06 DIAGNOSIS — I5032 Chronic diastolic (congestive) heart failure: Secondary | ICD-10-CM

## 2015-06-06 DIAGNOSIS — J438 Other emphysema: Secondary | ICD-10-CM

## 2015-06-06 DIAGNOSIS — Z720 Tobacco use: Secondary | ICD-10-CM

## 2015-06-06 NOTE — Addendum Note (Signed)
Addended by: Maryanna Shape A on: 06/06/2015 11:39 AM   Modules accepted: Orders

## 2015-06-06 NOTE — Progress Notes (Signed)
[facility] Consultation      Assessment and Plan:  COPD. -Severe, COPD/emphysema, though his functional status at this time appears to be better than indicated on his most recent spirometry. -Discussed the importance of doing things at this time to maintain his current level of functionality. This includes quitting smoking, increasing his activity level, continuing his current medications for COPD, and removing his dog from the bedroom. -We'll recheck a pulmonary function test and follow-up with him in 6 months  Chronic congestive diastolic heart failure. -This appears to be compensated at this time, we'll continue to monitor.  Nicotine abuse.  -Currently smoking electronic cigarettes. He is encouraged to wean down and quit.  GERD -Occasionally wakes up with phlegm in his throat, suspect this is more likely due to COPD and GERD. However, would recommend that he continue his current omeprazole.  Date: 06/06/2015  MRN# [id number] William Mueller 09/04/1950  Referring Physician: Dr. Rockey Situ.   William Mueller is a 64 y.o. old male seen in consultation for chief complaint of:   No chief complaint on file.   HPI:   Patient is a 64 year old male with a history of hypertension, anxiety, COPD, GERD, chronic kidney disease stage III, coronary artery disease, congestive heart failure with diastolic dysfunction, nicotine addiction. He was diagnosed with COPD in 2008, he quit cigs in 2011, he smokes e-cigs about 1.5 mg of nicotine daily, down from 24 mg initially.  He notes that he gets winded with activities with hooking up the camper. He can walk a [employer] without difficulties, he can mow the yard with a riding mower without difficulty. He can carry grocery bags without much difficulty.  Wife is present today and notes that when he does strenous work which includes bending and picking up stuff.   He is currently using spiriva daily, symbicort 2 puffs twice daily, and rinses  mouth afterwards. He also takes daliresp daily, which he feels has been very helpful, and notes that he has not required the use of a nebulizer since he was on that.  He has never been put on oxygen or tested for OSA. Wife notes that he snores at night. He gets a lot of phlegm in his throat. She does not notice that he stops breathing in his sleep.  He sleeps with his dog in the bed. He wakes up every night with a lot of phlegm in his throat.   He has never gone through pulmonary rehab because the distance is too far (15 mi) and the fuel would be too expensive.    In regards to his COPD he uses Symbicort, Spiriva, Daliresp.  Reviewing summary of previous testing, and old records. -Echocardiogram 04/08/2015 EF of [id number], normal systolic pressure, diastolic dysfunction, possibly contributing to significant dyspnea. -Spirometry from 04/17/2015, FVC was 64%, FEV1 was 27%, this appears to be significant for severe emphysema. Though a previous test showed that this was not as severe.  -The patient was previous seen by Dr. Raul Del. He was managed for COPD with Symbicort, Spiriva, Daliresp.    PMHX:   Past Medical History  Diagnosis Date  . Hypertension   . Anxiety   . COPD (chronic obstructive pulmonary disease) ([place])   . High cholesterol   . GERD (gastroesophageal reflux disease)   . Daily headache   . Chronic kidney disease (CKD), stage III (moderate)   . Coronary artery disease     a. 12/2013 PCI: mRCA 100% with L to R collats s/p PCI/DES;  b. 06/2014 MV: no ischemia/infarct, EF 53%;  c. 03/2015 Cath: LM nl, LAD nl, D1 80 (1.28mm), LCX 60m (<1.48mm), OM1 nl, RCA 55p, patent stent, RPDA nl, EF 65%.  . Depression   . Diastolic dysfunction     a. echo 12/2013: EF 55-60%, mild LVH, GR1DD, inf HK, elevated CVP, mildly dilated IVC suggestive of increased RA pressure  . Chronic chest pain   . Nicotine addiction     a. using eCigs.   Surgical Hx:  Past Surgical History  Procedure Laterality Date  .  No past surgeries    . Cardiac catheterization      MC x 1 stent  . Coronary angioplasty    . Left heart catheterization with coronary angiogram N/A 12/12/2013    Procedure: LEFT HEART CATHETERIZATION WITH CORONARY ANGIOGRAM;  Surgeon: Wellington Hampshire, MD;  Location: [facility];  Service: Cardiovascular;  Laterality: N/A;  . Cardiac catheterization Left 03/12/2015    Procedure: Left Heart Cath and Coronary Angiography;  Surgeon: Leonie Man, MD;  Location: [facility];  Service: Cardiovascular;  Laterality: Left;   Family Hx:  Family History  Problem Relation Age of Onset  . Coronary artery disease Brother   . Heart disease Brother 82  . Heart disease Father     MI x 8   Social Hx:   Social History  Substance Use Topics  . Smoking status: Former Smoker -- 3.00 packs/day for 48 years    Types: Cigarettes, E-cigarettes    Quit date: 06/07/2010  . Smokeless tobacco: Never Used     Comment: Using electronic cigarette  . Alcohol Use: 3.6 oz/week    6 Cans of beer per week   Medication:   Current Outpatient Rx  Name  Route  Sig  Dispense  Refill  . acetaminophen (TYLENOL) 500 MG tablet   Oral   Take 1,000 mg by mouth daily as needed for headache.          . ALPRAZolam (XANAX XR) 3 MG 24 hr tablet   Oral   Take 3 mg by mouth every evening. For anxiety control         . ALPRAZolam (XANAX) 1 MG tablet   Oral   Take 1 mg by mouth daily.          Marland Kitchen aspirin EC 81 MG EC tablet   Oral   Take 1 tablet (81 mg total) by mouth daily.   30 tablet   0   . budesonide-formoterol (SYMBICORT) 160-4.5 MCG/ACT inhaler   Inhalation   Inhale 2 puffs into the lungs 2 (two) times daily.         Marland Kitchen ezetimibe (ZETIA) 10 MG tablet   Oral   Take 1 tablet (10 mg total) by mouth daily.   90 tablet   3   . hydrALAZINE (APRESOLINE) 25 MG tablet   Oral   Take 1 tablet (25 mg total) by mouth 3 (three) times daily as needed.   90 tablet   4   . metoprolol succinate  (TOPROL-XL) 25 MG 24 hr tablet   Oral   Take 1 tablet (25 mg total) by mouth daily.   30 tablet   6   . nitroGLYCERIN (NITROSTAT) 0.4 MG SL tablet   Sublingual   Place 1 tablet (0.4 mg total) under the tongue every 5 (five) minutes as needed for chest pain. Call 911 after 3rd tablet   25 tablet   3   .  omeprazole (PRILOSEC) 40 MG capsule   Oral   Take 1 capsule (40 mg total) by mouth daily.   30 capsule   11   . pravastatin (PRAVACHOL) 80 MG tablet      TAKE ONE TABLET BY MOUTH AT BEDTIME   30 tablet   0   . ranolazine (RANEXA) 500 MG 12 hr tablet   Oral   Take 1 tablet (500 mg total) by mouth 2 (two) times daily. Patient taking differently: Take 500 mg by mouth daily.    60 tablet   1   . roflumilast (DALIRESP) 500 MCG TABS tablet   Oral   Take 500 mcg by mouth daily.         . ticagrelor (BRILINTA) 60 MG TABS tablet   Oral   Take 1 tablet (60 mg total) by mouth 2 (two) times daily.   60 tablet   11   . tiotropium (SPIRIVA) 18 MCG inhalation capsule   Inhalation   Place 18 mcg into inhaler and inhale daily.             Allergies:  Benzodiazepines; Paroxetine hcl; Serotonin reuptake inhibitors (ssris); Diazepam; Doxycycline monohydrate; Escitalopram oxalate; Isosorbide; Lorazepam; and Tetracyclines & related  Review of Systems: Gen:  Denies  fever, sweats, chills HEENT: Denies blurred vision, double vision. bleeds, sore throat Cvc:  No dizziness, chest pain. Resp:   Denies cough or sputum porduction, shortness of breath Gi: Denies swallowing difficulty, stomach pain. Gu:  Denies bladder incontinence, burning urine Ext:   No Joint pain, stiffness. Skin: No skin rash,  hives Endoc:  No polyuria, polydipsia. Psych: No depression, insomnia. Other:  All other systems were reviewed with the patient and were negative other that what is mentioned in the HPI.   Physical Examination:   VS: There were no vitals taken for this visit.  General Appearance: No  distress  Neuro:without focal findings,  speech normal,  HEENT: PERRLA, EOM intact.   Pulmonary: normal breath sounds, No wheezing.  CardiovascularNormal S1,S2.  No m/r/g.   Abdomen: Benign, Soft, non-tender. Renal:  No costovertebral tenderness  GU:  No performed at this time. Endoc: No evident thyromegaly, no signs of acromegaly. Skin:   warm, no rashes, no ecchymosis  Extremities: normal, no cyanosis, clubbing.  Other findings:    LABORATORY PANEL:   CBC No results for input(s): WBC, HGB, HCT, PLT in the last 168 hours. ------------------------------------------------------------------------------------------------------------------  Chemistries  No results for input(s): NA, K, CL, CO2, GLUCOSE, BUN, CREATININE, CALCIUM, MG, AST, ALT, ALKPHOS, BILITOT in the last 168 hours.  Invalid input(s): GFRCGP ------------------------------------------------------------------------------------------------------------------  Cardiac Enzymes No results for input(s): TROPONINI in the last 168 hours. ------------------------------------------------------------  RADIOLOGY:  No results found.     Thank  you for the consultation and for allowing [facility] to assist in the care of your patient. Our recommendations are noted above.  Please contact us if we can be of further service.   Marda Stalker, MD.  Board Certified in Internal Medicine, Pulmonary Medicine, [place], and Sleep Medicine.  [facility] Office Number: [phone]  Patricia Pesa, M.D.  Vilinda Boehringer, M.D.  Merton Border, M.D

## 2015-06-06 NOTE — Patient Instructions (Addendum)
Full PFT in 6 months.    --Increase activity.  --Pulmonary rehab referral, this will help your breathing and improve your stamina.  --Quit smoking.  --Remove pets from the bedroom.

## 2015-06-10 DIAGNOSIS — Z6826 Body mass index (BMI) 26.0-26.9, adult: Secondary | ICD-10-CM | POA: Diagnosis not present

## 2015-06-10 DIAGNOSIS — N529 Male erectile dysfunction, unspecified: Secondary | ICD-10-CM | POA: Diagnosis not present

## 2015-06-11 ENCOUNTER — Telehealth: Payer: Self-pay

## 2015-06-11 NOTE — Telephone Encounter (Signed)
Rhonda please advise 

## 2015-06-11 NOTE — Telephone Encounter (Signed)
Pt wife called, inquiring about pulmonary rehab. She would like to know if she needs to get auth from her insurance. Please call and advise

## 2015-06-12 NOTE — Telephone Encounter (Signed)
Called and spoke with pt's wife and advised that pre cert was not required for Pulmonary Rehab per Anderson Malta at [facility].  Advised wife that once [employer] contact pt to schedule, [facility] will advise pt of the cost of the program. Nothing else needed at this time. Rhonda J Cobb

## 2015-06-21 ENCOUNTER — Other Ambulatory Visit: Payer: Self-pay | Admitting: Cardiovascular Disease

## 2015-07-01 ENCOUNTER — Other Ambulatory Visit: Payer: Self-pay | Admitting: Cardiovascular Disease

## 2015-07-04 ENCOUNTER — Encounter: Payer: Self-pay | Admitting: Urology

## 2015-07-04 ENCOUNTER — Ambulatory Visit (INDEPENDENT_AMBULATORY_CARE_PROVIDER_SITE_OTHER): Payer: PPO | Admitting: Urology

## 2015-07-04 VITALS — BP 143/87 | HR 83 | Ht 69.0 in | Wt 185.3 lb

## 2015-07-04 DIAGNOSIS — R3913 Splitting of urinary stream: Secondary | ICD-10-CM | POA: Diagnosis not present

## 2015-07-04 DIAGNOSIS — R39198 Other difficulties with micturition: Secondary | ICD-10-CM

## 2015-07-04 DIAGNOSIS — Z125 Encounter for screening for malignant neoplasm of prostate: Secondary | ICD-10-CM | POA: Diagnosis not present

## 2015-07-04 DIAGNOSIS — N5203 Combined arterial insufficiency and corporo-venous occlusive erectile dysfunction: Secondary | ICD-10-CM

## 2015-07-04 LAB — BLADDER SCAN AMB NON-IMAGING: Scan Result: 26

## 2015-07-04 NOTE — Progress Notes (Signed)
07/04/2015 2:47 PM   William Mueller December 05, 1950 [id number]  Referring provider: Leonides Sake, MD [address] [place], [place] [address]  Chief Complaint  Patient presents with  . Erectile Dysfunction    referred by  Dr. Lisbeth Ply patient wants a 2nd opinion    HPI: 65 yo M  With severe erectile dysfunction presents today for a second opinion.   He was previously seen and evaluated at [facility] urology for the same issues.  He reports a history of severe erectile dysfunction which has progressed over the past 2 years. He is unable to achieve and maintain erections satisfactory for pan initiation. He does report having a few partial erections but also has some difficulty ejaculating secondary to the quality of his erection. He's had hematospermia in the past as well and pain with ejaculation.     He has tried PDE 5 inhibitors in the past (Viagra) but developed severe hypotension landing him in the emergency room. He also had a heart attack in 2015 and currently is on nitrates.   he was prescribed a vacuum erectile device but was unable to achieve a satisfactory erection with this. He could create negative pressure and the cylinder but was unable to draw significant amount of blood into the penile shaft.   He denies any penile pain or curvature. No history of penile trauma.   No issues with libido or sexual interest.   He does have some moderate baseline urinary symptoms primarily urinary intermittency and nocturia 2. IPSS 15/2.    IPSS Questionnaire (AUA-7): Over the past month.   1)  How often have you had a sensation of not emptying your bladder completely after you finish urinating?  2 - Less than half the time  2)  How often have you had to urinate again less than two hours after you finished urinating? 3 - About half the time  3)  How often have you found you stopped and started again several times when you urinated?  4 - More than half the time  4) How difficult have you  found it to postpone urination?  3 - About half the time  5) How often have you had a weak urinary stream?  0 - Not at all  6) How often have you had to push or strain to begin urination?  1 - Less than 1 time in 5  7) How many times did you most typically get up to urinate from the time you went to bed until the time you got up in the morning?  2 - 2 times  Total score:  0-7 mildly symptomatic   8-19 moderately symptomatic   20-35 severely symptomatic  Bother score 2       SHIM      07/04/15 1352       SHIM: Over the last 6 months:   How do you rate your confidence that you could get and keep an erection? Very Low     When you had erections with sexual stimulation, how often were your erections hard enough for penetration (entering your partner)? Almost Never or Never     During sexual intercourse, how often were you able to maintain your erection after you had penetrated (entered) your partner? Extremely Difficult     During sexual intercourse, how difficult was it to maintain your erection to completion of intercourse? Extremely Difficult     When you attempted sexual intercourse, how often was it satisfactory for you? Extremely  Difficult     SHIM Total Score   SHIM 5            Androgen Deficiency in the Aging Male      07/04/15 1300       Androgen Deficiency in the Aging Male   Do you have a decrease in libido (sex drive) No     Do you have lack of energy Yes     Do you have a decrease in strength and/or endurance Yes     Have you lost height No     Have you noticed a decreased "enjoyment of life" Yes     Are you sad and/or grumpy Yes     Are your erections less strong Yes     Have you noticed a recent deterioration in your ability to play sports Yes     Are you falling asleep after dinner Yes     Has there been a recent deterioration in your work performance Yes         PMH: Past Medical History  Diagnosis Date  . Hypertension   . Anxiety   . COPD (chronic  obstructive pulmonary disease) ([place])   . High cholesterol   . GERD (gastroesophageal reflux disease)   . Daily headache   . Chronic kidney disease (CKD), stage III (moderate)   . Coronary artery disease     a. 12/2013 PCI: mRCA 100% with L to R collats s/p PCI/DES;  b. 06/2014 MV: no ischemia/infarct, EF 53%;  c. 03/2015 Cath: LM nl, LAD nl, D1 80 (1.62mm), LCX 85m (<1.64mm), OM1 nl, RCA 55p, patent stent, RPDA nl, EF 65%.  . Depression   . Diastolic dysfunction     a. echo 12/2013: EF 55-60%, mild LVH, GR1DD, inf HK, elevated CVP, mildly dilated IVC suggestive of increased RA pressure  . Chronic chest pain   . Nicotine addiction     a. using eCigs.  . Heart attack ([place])   . Sleep apnea     Surgical History: Past Surgical History  Procedure Laterality Date  . Cardiac catheterization      MC x 1 stent  . Coronary angioplasty    . Left heart catheterization with coronary angiogram N/A 12/12/2013    Procedure: LEFT HEART CATHETERIZATION WITH CORONARY ANGIOGRAM;  Surgeon: Wellington Hampshire, MD;  Location: [place] CATH LAB;  Service: Cardiovascular;  Laterality: N/A;  . Cardiac catheterization Left 03/12/2015    Procedure: Left Heart Cath and Coronary Angiography;  Surgeon: Leonie Man, MD;  Location: [place] CV LAB;  Service: Cardiovascular;  Laterality: Left;    Home Medications:    Medication List       This list is accurate as of: 07/04/15  2:47 PM.  Always use your most recent med list.               acetaminophen 500 MG tablet  Commonly known as:  TYLENOL  Take 1,000 mg by mouth daily as needed for headache.     ALPRAZolam 3 MG 24 hr tablet  Commonly known as:  XANAX XR  Take 3 mg by mouth every evening. For anxiety control     ALPRAZolam 1 MG tablet  Commonly known as:  XANAX  Take 1 mg by mouth daily.     aspirin 81 MG EC tablet  Take 1 tablet (81 mg total) by mouth daily.     budesonide-formoterol 160-4.5 MCG/ACT inhaler  Commonly known as:  SYMBICORT  Inhale 2  puffs into the lungs 2 (two) times daily.     ezetimibe 10 MG tablet  Commonly known as:  ZETIA  Take 1 tablet (10 mg total) by mouth daily.     hydrALAZINE 25 MG tablet  Commonly known as:  APRESOLINE  Take 1 tablet (25 mg total) by mouth 3 (three) times daily as needed.     metoprolol succinate 25 MG 24 hr tablet  Commonly known as:  TOPROL-XL  Take 1 tablet (25 mg total) by mouth daily.     nitroGLYCERIN 0.4 MG SL tablet  Commonly known as:  NITROSTAT  Place 1 tablet (0.4 mg total) under the tongue every 5 (five) minutes as needed for chest pain. Call 911 after 3rd tablet     omeprazole 40 MG capsule  Commonly known as:  PRILOSEC  Take 1 capsule (40 mg total) by mouth daily.     pravastatin 80 MG tablet  Commonly known as:  PRAVACHOL  TAKE ONE TABLET BY MOUTH AT BEDTIME     RANEXA 500 MG 12 hr tablet  Generic drug:  ranolazine  TAKE ONE TABLET BY MOUTH TWICE DAILY     roflumilast 500 MCG Tabs tablet  Commonly known as:  DALIRESP  Take 500 mcg by mouth daily.     ticagrelor 60 MG Tabs tablet  Commonly known as:  BRILINTA  Take 1 tablet (60 mg total) by mouth 2 (two) times daily.     tiotropium 18 MCG inhalation capsule  Commonly known as:  SPIRIVA  Place 18 mcg into inhaler and inhale daily.        Allergies:  Allergies  Allergen Reactions  . Benzodiazepines Shortness Of Breath    Patient is on Xanax  . Paroxetine Hcl Shortness Of Breath and Palpitations  . Serotonin Reuptake Inhibitors (Ssris) Shortness Of Breath and Palpitations  . Diazepam Other (See Comments)    Rapid heartrate  . Doxycycline Monohydrate Other (See Comments)    Unknown allergic reaction  . Escitalopram Oxalate Other (See Comments)    serotonin Syndrome  . Isosorbide Other (See Comments)    Feels like head was going to explode -- HA  . Lorazepam Other (See Comments)    Unknown allergic reaction  . Tetracyclines & Related Itching and Rash    Family History: Family History    Problem Relation Age of Onset  . Coronary artery disease Brother   . Heart disease Brother 98  . Heart disease Father     MI x 8  . Kidney disease Neg Hx   . Prostate cancer Neg Hx     Social History:  reports that he quit smoking about 6 years ago. His smoking use included Cigarettes and E-cigarettes. He has a 144 pack-year smoking history. He has never used smokeless tobacco. He reports that he drinks about 3.6 oz of alcohol per week. He reports that he does not use illicit drugs.  ROS: UROLOGY Frequent Urination?: No Hard to postpone urination?: Yes Burning/pain with urination?: No Get up at night to urinate?: Yes Leakage of urine?: No Urine stream starts and stops?: Yes Trouble starting stream?: Yes Do you have to strain to urinate?: No Blood in urine?: No Urinary tract infection?: No Sexually transmitted disease?: No Injury to kidneys or bladder?: No Painful intercourse?: No Weak stream?: Yes Erection problems?: Yes Penile pain?: No  Gastrointestinal Nausea?: Yes Vomiting?: No Indigestion/heartburn?: Yes Diarrhea?: No Constipation?: Yes  Constitutional Fever: No Night sweats?: No Weight loss?: No Fatigue?: Yes  Skin  Skin rash/lesions?: No Itching?: Yes  Eyes Blurred vision?: Yes Double vision?: No  Ears/Nose/Throat Sore throat?: No Sinus problems?: No  Hematologic/Lymphatic Swollen glands?: No Easy bruising?: Yes  Cardiovascular Leg swelling?: No Chest pain?: Yes  Respiratory Cough?: Yes Shortness of breath?: Yes  Endocrine Excessive thirst?: No  Musculoskeletal Back pain?: Yes Joint pain?: No  Neurological Headaches?: Yes Dizziness?: No  Psychologic Depression?: No Anxiety?: Yes  Physical Exam: BP 143/87 mmHg  Pulse 83  Ht 5\' 9"  (1.753 m)  Wt 185 lb 4.8 oz (84.052 kg)  BMI 27.35 kg/m2  Constitutional:  Alert and oriented, No acute distress. HEENT: [place] AT, moist mucus membranes.  Trachea midline, no  masses. Cardiovascular: No clubbing, cyanosis, or edema. Respiratory: Normal respiratory effort, no increased work of breathing. GI: Abdomen is soft, nontender, nondistended, no abdominal masses GU: No CVA tenderness. Normal phallus without palpable penile plaques.  Orthotopic meatus.  Bilateral descended testicles without masses.   Rectal: Normal sphincter tone without masses, 30 cc gland, nontender. Skin: No rashes, bruises or suspicious lesions. Lymph: No cervical or inguinal adenopathy. Neurologic: Grossly intact, no focal deficits, moving all 4 extremities. Psychiatric: Normal mood and affect.  Laboratory Data: Lab Results  Component Value Date   WBC 6.7 03/11/2015   HGB 14.3 03/11/2015   HCT 41.7 03/11/2015   MCV 86.2 03/11/2015   PLT 259 03/11/2015    Lab Results  Component Value Date   CREATININE 1.41* 03/11/2015   PSA 2.1 on 08/28/14  Lab Results  Component Value Date   HGBA1C 5.5 12/11/2013    Urinalysis n/a  Pertinent Imaging: Results for orders placed or performed in visit on 07/04/15  BLADDER SCAN AMB NON-IMAGING  Result Value Ref Range   Scan Result 26      Assessment & Plan:    1. Combined arterial insufficiency and corporo-venous occlusive erectile dysfunction We discussed the pathophysiology of erectile dysfunction today along with possible contributing factors. Discussed possible treatment options including PDE 5 inhibitors (not a candidate given hypotension and nitrates), vacuum erectile device (unsucessful trial), intracavernosal injection, MUSE, and placement of the inflatable or malleable penal prosthesis for refractory cases.   He is interested in pursuing tri-mix injections. We discussed the injections themselves along with possible complications including penile pain, fibrosis at the injection sites, and priapism.   Prescription called into compounding pharmacy 10 doses. He will return in a few weeks with the medication for intracavernosal  injection teaching.  2. Intermittent urinary stream  He des not seem to be particularly bothered. Adequate bladder emptying today. - BLADDER SCAN AMB NON-IMAGING  3. Prostate cancer screening  Shared decision-making for continued PSA screening. He is interested in continuing this. PSA ordered today, will check at next visit.  Return in about 2 weeks (around 07/18/2015) for trimix teaching (am apt only).  Hollice Espy, MD  [facility] [address] [place],  [address] [phone]

## 2015-07-08 DIAGNOSIS — I10 Essential (primary) hypertension: Secondary | ICD-10-CM | POA: Diagnosis not present

## 2015-07-08 DIAGNOSIS — E782 Mixed hyperlipidemia: Secondary | ICD-10-CM | POA: Diagnosis not present

## 2015-07-08 DIAGNOSIS — Z23 Encounter for immunization: Secondary | ICD-10-CM | POA: Diagnosis not present

## 2015-07-08 DIAGNOSIS — F419 Anxiety disorder, unspecified: Secondary | ICD-10-CM | POA: Diagnosis not present

## 2015-07-08 DIAGNOSIS — N183 Chronic kidney disease, stage 3 (moderate): Secondary | ICD-10-CM | POA: Diagnosis not present

## 2015-07-08 DIAGNOSIS — Z6826 Body mass index (BMI) 26.0-26.9, adult: Secondary | ICD-10-CM | POA: Diagnosis not present

## 2015-07-08 DIAGNOSIS — Z79899 Other long term (current) drug therapy: Secondary | ICD-10-CM | POA: Diagnosis not present

## 2015-07-10 ENCOUNTER — Telehealth: Payer: Self-pay | Admitting: *Deleted

## 2015-07-10 NOTE — Telephone Encounter (Signed)
Pt is wife calling stating they can't afford to pay for Bralinta and Renxa Asking  if we can help with the medication. They had it last year but this is a start of the new year.  Almost out on each medication.  Please advise.

## 2015-07-11 ENCOUNTER — Ambulatory Visit (INDEPENDENT_AMBULATORY_CARE_PROVIDER_SITE_OTHER): Payer: PPO | Admitting: Urology

## 2015-07-11 VITALS — BP 163/99 | HR 102 | Ht 71.0 in | Wt 187.3 lb

## 2015-07-11 DIAGNOSIS — N5203 Combined arterial insufficiency and corporo-venous occlusive erectile dysfunction: Secondary | ICD-10-CM | POA: Diagnosis not present

## 2015-07-11 DIAGNOSIS — Z125 Encounter for screening for malignant neoplasm of prostate: Secondary | ICD-10-CM | POA: Diagnosis not present

## 2015-07-11 NOTE — Telephone Encounter (Signed)
LMTCB regarding Brilinta and Renexa.

## 2015-07-11 NOTE — Progress Notes (Signed)
07/11/2015 2:58 PM   William Mueller 03-12-1951 [id number]  Referring provider: Leonides Sake, MD [address] [place], [place] [address]  Chief Complaint  Patient presents with  . Erectile Dysfunction    Trimix teaching    HPI: 65 yo M here today for trimix injection teaching.  Baseline erectile dysfunction as per previous note.  Not a candidate for PDE 5 inhibitors given current nitrate prescription.  PSA screening today as well.  Rectal exam last visit unremarkable.     PMH: Past Medical History  Diagnosis Date  . Hypertension   . Anxiety   . COPD (chronic obstructive pulmonary disease) ([place])   . High cholesterol   . GERD (gastroesophageal reflux disease)   . Daily headache   . Chronic kidney disease (CKD), stage III (moderate)   . Coronary artery disease     a. 12/2013 PCI: mRCA 100% with L to R collats s/p PCI/DES;  b. 06/2014 MV: no ischemia/infarct, EF 53%;  c. 03/2015 Cath: LM nl, LAD nl, D1 80 (1.14mm), LCX 29m (<1.29mm), OM1 nl, RCA 55p, patent stent, RPDA nl, EF 65%.  . Depression   . Diastolic dysfunction     a. echo 12/2013: EF 55-60%, mild LVH, GR1DD, inf HK, elevated CVP, mildly dilated IVC suggestive of increased RA pressure  . Chronic chest pain   . Nicotine addiction     a. using eCigs.  . Heart attack ([place])   . Sleep apnea     Surgical History: Past Surgical History  Procedure Laterality Date  . Cardiac catheterization      MC x 1 stent  . Coronary angioplasty    . Left heart catheterization with coronary angiogram N/A 12/12/2013    Procedure: LEFT HEART CATHETERIZATION WITH CORONARY ANGIOGRAM;  Surgeon: Wellington Hampshire, MD;  Location: [place] CATH LAB;  Service: Cardiovascular;  Laterality: N/A;  . Cardiac catheterization Left 03/12/2015    Procedure: Left Heart Cath and Coronary Angiography;  Surgeon: Leonie Man, MD;  Location: [place] CV LAB;  Service: Cardiovascular;  Laterality: Left;    Home Medications:    Medication List       This list is accurate as of: 07/11/15 11:59 PM.  Always use your most recent med list.               acetaminophen 500 MG tablet  Commonly known as:  TYLENOL  Take 1,000 mg by mouth daily as needed for headache.     ALPRAZolam 3 MG 24 hr tablet  Commonly known as:  XANAX XR  Take 3 mg by mouth every evening. For anxiety control     ALPRAZolam 1 MG tablet  Commonly known as:  XANAX  Take 1 mg by mouth daily.     aspirin 81 MG EC tablet  Take 1 tablet (81 mg total) by mouth daily.     budesonide-formoterol 160-4.5 MCG/ACT inhaler  Commonly known as:  SYMBICORT  Inhale 2 puffs into the lungs 2 (two) times daily.     ezetimibe 10 MG tablet  Commonly known as:  ZETIA  Take 1 tablet (10 mg total) by mouth daily.     hydrALAZINE 25 MG tablet  Commonly known as:  APRESOLINE  Take 1 tablet (25 mg total) by mouth 3 (three) times daily as needed.     metoprolol succinate 25 MG 24 hr tablet  Commonly known as:  TOPROL-XL  Take 1 tablet (25 mg total) by mouth daily.  nitroGLYCERIN 0.4 MG SL tablet  Commonly known as:  NITROSTAT  Place 1 tablet (0.4 mg total) under the tongue every 5 (five) minutes as needed for chest pain. Call 911 after 3rd tablet     omeprazole 40 MG capsule  Commonly known as:  PRILOSEC  Take 1 capsule (40 mg total) by mouth daily.     pravastatin 80 MG tablet  Commonly known as:  PRAVACHOL  TAKE ONE TABLET BY MOUTH AT BEDTIME     RANEXA 500 MG 12 hr tablet  Generic drug:  ranolazine  TAKE ONE TABLET BY MOUTH TWICE DAILY     roflumilast 500 MCG Tabs tablet  Commonly known as:  DALIRESP  Take 500 mcg by mouth daily.     ticagrelor 60 MG Tabs tablet  Commonly known as:  BRILINTA  Take 1 tablet (60 mg total) by mouth 2 (two) times daily.     tiotropium 18 MCG inhalation capsule  Commonly known as:  SPIRIVA  Place 18 mcg into inhaler and inhale daily.        Allergies:  Allergies  Allergen Reactions  . Benzodiazepines Shortness Of  Breath    Patient is on Xanax  . Paroxetine Hcl Shortness Of Breath and Palpitations  . Serotonin Reuptake Inhibitors (Ssris) Shortness Of Breath and Palpitations  . Diazepam Other (See Comments)    Rapid heartrate  . Doxycycline Monohydrate Other (See Comments)    Unknown allergic reaction  . Escitalopram Oxalate Other (See Comments)    serotonin Syndrome  . Isosorbide Other (See Comments)    Feels like head was going to explode -- HA  . Lorazepam Other (See Comments)    Unknown allergic reaction  . Tetracyclines & Related Itching and Rash    Family History: Family History  Problem Relation Age of Onset  . Coronary artery disease Brother   . Heart disease Brother 20  . Heart disease Father     MI x 8  . Kidney disease Neg Hx   . Prostate cancer Neg Hx     Social History:  reports that he quit smoking about 6 years ago. His smoking use included Cigarettes and E-cigarettes. He has a 144 pack-year smoking history. He has never used smokeless tobacco. He reports that he drinks about 3.6 oz of alcohol per week. He reports that he does not use illicit drugs.  ROS: UROLOGY Frequent Urination?: No Hard to postpone urination?: No Burning/pain with urination?: No Get up at night to urinate?: No Leakage of urine?: No Urine stream starts and stops?: No Trouble starting stream?: No Do you have to strain to urinate?: No Blood in urine?: No Urinary tract infection?: No Sexually transmitted disease?: No Injury to kidneys or bladder?: No Painful intercourse?: No Weak stream?: No Erection problems?: Yes Penile pain?: No  Gastrointestinal Nausea?: No Vomiting?: No Indigestion/heartburn?: Yes Diarrhea?: No Constipation?: No  Constitutional Fever: No Night sweats?: No Weight loss?: No Fatigue?: Yes  Skin Skin rash/lesions?: No Itching?: Yes  Eyes Blurred vision?: No Double vision?: No  Ears/Nose/Throat Sore throat?: No Sinus problems?:  No  Hematologic/Lymphatic Swollen glands?: No Easy bruising?: Yes  Cardiovascular Leg swelling?: No Chest pain?: Yes  Respiratory Cough?: No Shortness of breath?: Yes  Endocrine Excessive thirst?: No  Musculoskeletal Back pain?: No Joint pain?: No  Neurological Headaches?: Yes Dizziness?: No  Psychologic Depression?: No Anxiety?: Yes  Physical Exam: BP 163/99 mmHg  Pulse 102  Ht 5\' 11"  (1.803 m)  Wt 187 lb 4.8 oz (84.959  kg)  BMI 26.13 kg/m2  Constitutional:  Alert and oriented, No acute distress. HEENT: [place] AT, moist mucus membranes.  Trachea midline, no masses. Cardiovascular: No clubbing, cyanosis, or edema. Respiratory: Normal respiratory effort, no increased work of breathing. GI: Abdomen is soft, nontender, nondistended, no abdominal masses GU: No CVA tenderness. Circumcised phallus without any penile plaques. Normal orthotopic meatus. Skin: No rashes, bruises or suspicious lesions.. Neurologic: Grossly intact, no focal deficits, moving all 4 extremities. Psychiatric: Normal mood and affect.  Laboratory Data: Lab Results  Component Value Date   WBC 6.7 03/11/2015   HGB 14.3 03/11/2015   HCT 41.7 03/11/2015   MCV 86.2 03/11/2015   PLT 259 03/11/2015    Lab Results  Component Value Date   CREATININE 1.41* 03/11/2015     Lab Results  Component Value Date   HGBA1C 5.5 12/11/2013    Trimix teaching Patient's phallus placed on stretch. Appropriate injection sites reviewed with patient including the mid shaft lateral corpora on either side. He was obstructed to avoid injections near the 12:00 or 6:00 positions given presence of neurovascular bundle and urethra at these locations.  Injection site then prepped using alcohol pad. 0.5 mL's of tri-mix solution delivered to the patient's right mid shaft corpora. He tolerated the injection well. After proximally 10 minutes, he had achieved an approximate 80% rigid erection. No associated penile  pain.  Erection resolved spontaneously approximately after 5-10 minutes.  Assessment & Plan:    1. Combined arterial insufficiency and corporo-venous occlusive erectile dysfunction Successful tri-mix teaching today. He feels confident he will be able to do this at home by himself. I have advised him given the relatively short duration of his erection, that he may need to titrate up his dose to 0.75 mL as needed. He was instructed to contact our office immediately if he develops penile pain or a long-standing erection greater than 4 hours and was advised to seek urgent medical attention if unable to reach [place].  2. Prostate cancer screening PSA screening discussed at last visit, PSA drawn today. Willl contact patient if concerning. - PSA    Return in about 1 year (around 07/10/2016) for recheck ED.  Hollice Espy, MD  [facility] [address] [place], [place] [address] [phone]

## 2015-07-11 NOTE — Telephone Encounter (Signed)
Spoke with the patient regarding samples available of Ranexa and Brilinta. The patient will pick up the samples on Monday.

## 2015-07-11 NOTE — Telephone Encounter (Signed)
Pt wife returning our call °Please call back  ° °

## 2015-07-12 ENCOUNTER — Encounter: Payer: Self-pay | Admitting: Urology

## 2015-07-12 LAB — PSA: Prostate Specific Ag, Serum: 0.6 ng/mL (ref 0.0–4.0)

## 2015-07-14 ENCOUNTER — Telehealth: Payer: Self-pay

## 2015-07-14 NOTE — Telephone Encounter (Signed)
Informed pt we don't get samples of Daliresp. Nothing further needed.

## 2015-07-14 NOTE — Telephone Encounter (Signed)
Pt wife called, would like samples of daliresp

## 2015-07-16 ENCOUNTER — Telehealth: Payer: Self-pay

## 2015-07-16 NOTE — Telephone Encounter (Signed)
Pt wife called stating pt was in our office last week and was taught how to administer trimix to himself. Wife wanted to know how much of the medication was to be used. Wife stated pt has individual syringes of medication. Made wife aware the whole syringe should be administered. Wife voiced understanding.

## 2015-07-21 ENCOUNTER — Telehealth: Payer: Self-pay | Admitting: *Deleted

## 2015-07-21 NOTE — Telephone Encounter (Signed)
Pt wife calling stating she picked up samples on Friday and samples were wrong Pt is taking Brilinta 60 mg and we gave the 90 mg Please advise.

## 2015-07-21 NOTE — Telephone Encounter (Signed)
Spoke with [name]) pt's wife that we do have samples available of Brilinta 60 mg Lot # [id number] with Exp. [id number]. Told the wife not to have [name] take the Brilinta 90 mg and to bring the samples of 90 mg back to the office and we will provide him with the correct dose of 60 mg. Told the wife we are sorry that this happened and she was very understanding and thanked her for letting us know this. The patient did take one dose of the 90 mg, I told the wife to not have him take anymore of the 90 mg and to pick up the 60 mg tablets.

## 2015-07-24 ENCOUNTER — Telehealth: Payer: Self-pay | Admitting: Internal Medicine

## 2015-07-24 NOTE — Telephone Encounter (Signed)
Received forms. Will fill out and submit.

## 2015-07-24 NOTE — Telephone Encounter (Signed)
Patient wife dropped off med assistance forms .   [place] and [employer] forms placed in nurse box.   [place] of [place] made and placed in cardiology box as well as patient needs med assistance for Brilinta rx by [facility]

## 2015-07-25 NOTE — Telephone Encounter (Signed)
LM on VM for pt to inform him that I need copy of current proof of income and copy of his Medicare RX card. Forms are filled out for pulmonary medications. Will fax once additional needed information is received.

## 2015-07-25 NOTE — Telephone Encounter (Signed)
[employer] form also placed in refill box for Cardiology.  Please fax / call patient when complete.

## 2015-07-25 NOTE — Telephone Encounter (Signed)
Forms filled out, waiting for Dr. Rockey Situ to sign so they can be mailed.

## 2015-07-28 ENCOUNTER — Other Ambulatory Visit: Payer: Self-pay | Admitting: *Deleted

## 2015-07-28 NOTE — Telephone Encounter (Signed)
Papers faxed to [facility] & me. Nothing further needed.

## 2015-07-28 NOTE — Telephone Encounter (Signed)
Mailed out the [place] patient assistance forms today for Brilinta.

## 2015-08-20 ENCOUNTER — Telehealth: Payer: Self-pay | Admitting: Internal Medicine

## 2015-08-20 NOTE — Telephone Encounter (Signed)
Please call patient's wife as she is having problems with her husbands expense of his medications.

## 2015-08-20 NOTE — Telephone Encounter (Signed)
Wife states that have been trying to get assistance with pt's medications and he has been denied. She is asking if there is something similar to [place] but is cheaper. States it is too expensive. Please advise. DR pt.

## 2015-08-21 ENCOUNTER — Telehealth: Payer: Self-pay | Admitting: Cardiovascular Disease

## 2015-08-21 NOTE — Telephone Encounter (Signed)
Spoke w/ pt's wife.  She reports that pt is c/o left leg pain that started last night. Reports that his chest pain is chronic and unchanged, but leg pain is new. Pt reports that he whole leg hurts, denies redness, heat or swelling. Pt has been on pravastatin for some time, so not concerned that sx are side effect. She is unsure if he has a pinched nerve or sat wrong. Advised her that if pt develops sx of DVT, to seek immediate attention. She will contact pt's PCP and call us back if we can be of further assistance.

## 2015-08-21 NOTE — Telephone Encounter (Signed)
Pt wife calling stating pt is having some pains going down his left leg Having "little" chest pain  Pt c/o of Chest Pain: STAT if CP now or developed within 24 hours  1. Are you having CP right now? Little bit  2. Are you experiencing any other symptoms (ex. SOB, nausea, vomiting, sweating)? Leg pain   3. How long have you been experiencing CP? Pain in leg started last night   4. Is your CP continuous or coming and going? Various   5. Have you taken Nitroglycerin? Took one last night.  ?

## 2015-09-02 ENCOUNTER — Telehealth: Payer: Self-pay

## 2015-09-02 NOTE — Telephone Encounter (Signed)
Wife informed I have a Symbicort to pick up but not any Spiriva. Nothing further needed.

## 2015-09-02 NOTE — Telephone Encounter (Signed)
Pt wife would like samples of Symbicort and Spiriva for pt. Please call.

## 2015-09-03 ENCOUNTER — Telehealth: Payer: Self-pay | Admitting: Cardiovascular Disease

## 2015-09-03 NOTE — Telephone Encounter (Signed)
Patient calling the office for samples of medication:   1.  What medication and dosage are you requesting samples for? Brilinta 60 mg   2.  Are you currently out of this medication? Yes    Pt wife calling stating she will be coming this morning to pick up another medication from Pulmonary would like to pick this up around same time today.  Please call back.

## 2015-09-03 NOTE — Telephone Encounter (Signed)
Samples provided for Mr. Sydow of Brilinta 60 mg.

## 2015-09-04 ENCOUNTER — Telehealth: Payer: Self-pay | Admitting: Cardiovascular Disease

## 2015-09-04 ENCOUNTER — Ambulatory Visit: Payer: PPO

## 2015-09-04 ENCOUNTER — Encounter: Payer: PPO | Admitting: Pharmacist

## 2015-09-04 ENCOUNTER — Telehealth: Payer: Self-pay | Admitting: Internal Medicine

## 2015-09-04 MED ORDER — OMEPRAZOLE 40 MG PO CPDR: 40.0000 mg | DELAYED_RELEASE_CAPSULE | Freq: Every day | ORAL | Status: DC

## 2015-09-04 MED ORDER — ASPIRIN 81 MG PO TBEC: 81.0000 mg | DELAYED_RELEASE_TABLET | Freq: Every day | ORAL | Status: DC

## 2015-09-04 MED ORDER — HYDRALAZINE HCL 25 MG PO TABS: 25.0000 mg | ORAL_TABLET | Freq: Three times a day (TID) | ORAL | Status: DC | PRN

## 2015-09-04 MED ORDER — ALBUTEROL SULFATE HFA 108 (90 BASE) MCG/ACT IN AERS: 1.0000 | INHALATION_SPRAY | Freq: Four times a day (QID) | RESPIRATORY_TRACT | Status: DC | PRN

## 2015-09-04 MED ORDER — PRAVASTATIN SODIUM 80 MG PO TABS: 80.0000 mg | ORAL_TABLET | Freq: Every day | ORAL | Status: DC

## 2015-09-04 MED ORDER — BUDESONIDE-FORMOTEROL FUMARATE 160-4.5 MCG/ACT IN AERO: 2.0000 | INHALATION_SPRAY | Freq: Two times a day (BID) | RESPIRATORY_TRACT | Status: DC

## 2015-09-04 MED ORDER — TIOTROPIUM BROMIDE MONOHYDRATE 18 MCG IN CAPS: 18.0000 ug | ORAL_CAPSULE | Freq: Every day | RESPIRATORY_TRACT | Status: DC

## 2015-09-04 MED ORDER — NITROGLYCERIN 0.4 MG SL SUBL: 0.4000 mg | SUBLINGUAL_TABLET | SUBLINGUAL | Status: DC | PRN

## 2015-09-04 MED ORDER — ROFLUMILAST 500 MCG PO TABS: 500.0000 ug | ORAL_TABLET | Freq: Every day | ORAL | Status: DC

## 2015-09-04 MED ORDER — METOPROLOL SUCCINATE ER 25 MG PO TB24: 25.0000 mg | ORAL_TABLET | Freq: Every day | ORAL | Status: DC

## 2015-09-04 NOTE — Telephone Encounter (Signed)
°*  STAT* If patient is at the pharmacy, call can be transferred to refill team.   1. Which medications need to be refilled? (please list name of each medication and dose if known)   symbicort 160-4.5 mcg/act 2 puffs inhaled twice daily ; spiriva 18 mcg inhale once daily ; albuterol hfa 90 mcg inhale prn   2. Which pharmacy/location (including street and city if local pharmacy) is medication to be sent to? [facility] phone [phone] fax- [phone]    3. Do they need a 30 day or 90 day supply? 90 days for 3 refills

## 2015-09-04 NOTE — Telephone Encounter (Signed)
Also daliresp 500 mcg by mouth  For this patient needs rx to state he is in the donut hole

## 2015-09-04 NOTE — Telephone Encounter (Signed)
Prescriptions printed and faxed to [facility]. Nothing further needed.

## 2015-09-04 NOTE — Telephone Encounter (Signed)
Patient was approved for assistance with [facility] for the following meds please send via fax or electronic submission 90 day supply with 3 refills for all   *STAT* If patient is at the pharmacy, call can be transferred to refill team.   1. Which medications need to be refilled? (please list name of each medication and dose if known)  Aspirin 81 mg po daily ; hydralazine hcl 25 mg po x 3 prn daily ; metoprolol succinate 25 mg po daily ; nitroglycerin sl tab 0.4 mg ; omeprazole 40 mg po ;                    prevastatin sodium 80 mg po daily    2. Which pharmacy/location (including street and city if local pharmacy) is medication to be sent to? [facility] [phone] (PHONE)  3. Do they need a 30 day or 90 day supply? 90 day x 3 refills

## 2015-09-04 NOTE — Telephone Encounter (Signed)
Refill sent for Hydralazine, metoprolol, NTG and pravastatin and aspirin to [facility] for 90 day supply.

## 2015-09-05 ENCOUNTER — Telehealth: Payer: Self-pay | Admitting: Cardiovascular Disease

## 2015-09-05 NOTE — Telephone Encounter (Signed)
Would change to Lipitor 40 mg daily Hold the Pravachol

## 2015-09-05 NOTE — Telephone Encounter (Signed)
Spoke w/ Erasmo Downer, [place].  She states that [facility] does not cover pravastatin, but will cover atorvastatin.  Pt was briefly on atorvastatin in 2015, but there is no documentation as to why this was switched during hospitalization. Erasmo Downer would like to know if pt can retry atorvastatin, as he is in the doughnut hole and they are helping him out w/ his med costs for a while. Please advise.  Thank you.

## 2015-09-05 NOTE — Telephone Encounter (Signed)
Pharmacist has a question regarding Pravachol. Please call.

## 2015-09-08 MED ORDER — ATORVASTATIN CALCIUM 40 MG PO TABS: 40.0000 mg | ORAL_TABLET | Freq: Every day | ORAL | Status: DC

## 2015-09-08 NOTE — Telephone Encounter (Signed)
Spoke w/ Erasmo Downer.   Advised her of Dr. Donivan Scull recommendation. Updated rx sent over.

## 2015-09-15 DIAGNOSIS — Z79899 Other long term (current) drug therapy: Secondary | ICD-10-CM | POA: Diagnosis not present

## 2015-09-15 DIAGNOSIS — R202 Paresthesia of skin: Secondary | ICD-10-CM | POA: Diagnosis not present

## 2015-09-15 DIAGNOSIS — S60551A Superficial foreign body of right hand, initial encounter: Secondary | ICD-10-CM | POA: Diagnosis not present

## 2015-09-26 ENCOUNTER — Telehealth: Payer: Self-pay | Admitting: Internal Medicine

## 2015-09-26 ENCOUNTER — Encounter: Payer: Self-pay | Admitting: Cardiovascular Disease

## 2015-09-26 ENCOUNTER — Ambulatory Visit (INDEPENDENT_AMBULATORY_CARE_PROVIDER_SITE_OTHER): Payer: Medicare Other | Admitting: Cardiovascular Disease

## 2015-09-26 VITALS — BP 120/80 | HR 86 | Ht 69.0 in | Wt 185.0 lb

## 2015-09-26 DIAGNOSIS — J432 Centrilobular emphysema: Secondary | ICD-10-CM | POA: Diagnosis not present

## 2015-09-26 DIAGNOSIS — I471 Supraventricular tachycardia: Secondary | ICD-10-CM

## 2015-09-26 DIAGNOSIS — J449 Chronic obstructive pulmonary disease, unspecified: Secondary | ICD-10-CM

## 2015-09-26 DIAGNOSIS — I209 Angina pectoris, unspecified: Secondary | ICD-10-CM | POA: Diagnosis not present

## 2015-09-26 DIAGNOSIS — N183 Chronic kidney disease, stage 3 unspecified: Secondary | ICD-10-CM

## 2015-09-26 DIAGNOSIS — I4719 Other supraventricular tachycardia: Secondary | ICD-10-CM

## 2015-09-26 MED ORDER — PREDNISONE 20 MG PO TABS
20.0000 mg | ORAL_TABLET | Freq: Every day | ORAL | Status: DC
Start: 2015-09-26 — End: 2015-09-29

## 2015-09-26 MED ORDER — ALBUTEROL SULFATE (2.5 MG/3ML) 0.083% IN NEBU: 2.5000 mg | INHALATION_SOLUTION | Freq: Four times a day (QID) | RESPIRATORY_TRACT | Status: DC | PRN

## 2015-09-26 NOTE — Telephone Encounter (Signed)
Will do PA on Daliresp.

## 2015-09-26 NOTE — Patient Instructions (Addendum)
Lungs sound very tight on today's visit  Brilinta  Samples Ranexa smaples currently not available  Please start prednisone  40 mg daily 4 days 30 mg daily 4 days 20 mg daily 4 days 10 mg daily 4 days  Continue to use your albuterol inhaler up to every 4-6 hours as needed for shortness of breath  We will work on trying to obtain a nebulizer machine Use albuterol nebs up to every 4 to 6 hours as needed for shortness of breath  If you do not have improvement in your breathing over the next 2 weeks, please call the office  Please call us if you have new issues that need to be addressed before your next appt.  Your physician wants you to follow-up in: 1 month.  Marland Kitchen

## 2015-09-26 NOTE — Assessment & Plan Note (Signed)
Creatinine 1.4 in October 2016 He reports normal lab work through primary care, not available to [place] at this time

## 2015-09-26 NOTE — Assessment & Plan Note (Signed)
He is having chest pain symptoms, unable to exclude angina though symptoms could be secondary to COPD exacerbation. He prefers to treat long issues first prior to any cardiac catheterization We'll see him back in close follow-up in case his symptoms do not improve   Total encounter time more than 25 minutes  Greater than 50% was spent in counseling and coordination of care with the patient

## 2015-09-26 NOTE — Assessment & Plan Note (Signed)
Presentation today concerning for COPD exacerbation. He does have more sputum, cough though less likely bronchitis Reports he feels better on albuterol but this is short-lived He does not have nebulizer machine at home that works, they are all "broken" We have called the pulmonary department for assistance with ordering a nebulizer machine. Order will be placed to obtain nebulizer machine through [facility]. I placed a prescription for albuterol for his nebulizer. Discussed the use of prednisone with him. He has tried this before with dramatic improvement of his symptoms. He would like to try prednisone before any other testing.  Prednisone taper sent in, 40 mg 4 days, 30 mg 4 days, 20 mg 4 days, 10 mg 4 days Recommended he take the prednisone taper, nebulizer treatments, albuterol inhaler for breakthrough If he does not have improvement in his symptoms, recommended he call our office in 2 weeks' time

## 2015-09-26 NOTE — Telephone Encounter (Signed)
Pt wife calling stating pt changed his insurance yesterday They were told in order to get DALIRESP  That we would need to do a Prior auth on this New pharmacy is [facility] in [place].  [employer] is the [employer].

## 2015-09-26 NOTE — Telephone Encounter (Signed)
[facility] calling pt in neb medication due to the tightness. Placing order for nebulizer machine.

## 2015-09-26 NOTE — Telephone Encounter (Signed)
Submitted PA for Daliresp thru [facility]. Key: [id number]  Pt ID: [id number]  Will await response.

## 2015-09-26 NOTE — Progress Notes (Signed)
Patient ID: William Mueller, male    DOB: 1950-11-12, 65 y.o.   MRN: [id number]  HPI Comments:  William Mueller is a very pleasant 65 year old gentleman with a long history of smoking, COPD, chronic renal insufficiency,  who presented 12/11/2013 to [facility] with chest pain. He was taken to the cardiac catheterization lab where he was found to have an occluded mid RCA. Stent was placed. Repeat catheterization in October 2016 with patent stent, but did have disease of a small diagonal and distal circumflex not amenable to intervention. He presents today for follow-up of his coronary artery disease  In follow-up today, he has worsening shortness of breath over the past several weeks Reports that he went for a walk where he lives, down the [place] was around 1/2 mile, almost "fell out" when he finished. Could not breathe, was having severe difficulty. Has noticed he feels is air gets stuck around halfway down, not breathing in the bottom. In the past he denies any hypoxia, reports his saturations stay in the low 90s but will recover. Despite this he is unable to breathe. He feels the breathing is affecting his sleeping as well. He has no energy, feels fatigued all the time.  Occasionally has chest tightness when he has shortness of breath, no leg edema, no abdominal bloating.  EKG on today's visit shows normal sinus rhythm with rate 86 beats for minute, no significant ST or T-wave changes  Reports he had lab work recently that showed everything was "normal" No recent lipid panel available  Other past medical history Previously  did a PFT, technician reported that he passed out. He does not remember any significant difficulty but the test was abruptly stopped  Unable to tolerate isosorbide secondary to headache  previously total cholesterol was 200  he presented to the hospital 02/26/2014 with chest pain. He seen by cardiology, rule out for MI, had normal EKG and discharged  home. Echocardiogram July 2015 showed ejection fraction 55-60%  Creatinine in 10/22/2013 was 2.1   Allergies  Allergen Reactions  . Benzodiazepines Shortness Of Breath    Patient is on Xanax  . Paroxetine Hcl Shortness Of Breath and Palpitations  . Serotonin Reuptake Inhibitors (Ssris) Shortness Of Breath and Palpitations  . Diazepam Other (See Comments)    Rapid heartrate  . Doxycycline Monohydrate Other (See Comments)    Unknown allergic reaction  . Escitalopram Oxalate Other (See Comments)    serotonin Syndrome  . Isosorbide Nitrate Other (See Comments)    Feels like head was going to explode -- HA  . Lorazepam Other (See Comments)    Unknown allergic reaction  . Tetracyclines & Related Itching and Rash    Outpatient Encounter Prescriptions as of 09/26/2015  Medication Sig  . acetaminophen (TYLENOL) 500 MG tablet Take 1,000 mg by mouth daily as needed for headache.   . albuterol (PROVENTIL HFA;VENTOLIN HFA) 108 (90 Base) MCG/ACT inhaler Inhale 1-2 puffs into the lungs every 6 (six) hours as needed for wheezing or shortness of breath.  . ALPRAZolam (XANAX XR) 3 MG 24 hr tablet Take 3 mg by mouth every evening. For anxiety control  . ALPRAZolam (XANAX) 1 MG tablet Take 1 mg by mouth daily.   Marland Kitchen aspirin 81 MG EC tablet Take 1 tablet (81 mg total) by mouth daily.  Marland Kitchen atorvastatin (LIPITOR) 40 MG tablet Take 1 tablet (40 mg total) by mouth daily.  . budesonide-formoterol (SYMBICORT) 160-4.5 MCG/ACT inhaler Inhale 2 puffs into the lungs  2 (two) times daily.  Marland Kitchen ezetimibe (ZETIA) 10 MG tablet Take 1 tablet (10 mg total) by mouth daily.  . hydrALAZINE (APRESOLINE) 25 MG tablet Take 1 tablet (25 mg total) by mouth 3 (three) times daily as needed.  . metoprolol succinate (TOPROL-XL) 25 MG 24 hr tablet Take 1 tablet (25 mg total) by mouth daily.  . nitroGLYCERIN (NITROSTAT) 0.4 MG SL tablet Place 1 tablet (0.4 mg total) under the tongue every 5 (five) minutes as needed for chest pain. Call  911 after 3rd tablet  . omeprazole (PRILOSEC) 40 MG capsule Take 1 capsule (40 mg total) by mouth daily.  Marland Kitchen RANEXA 500 MG 12 hr tablet TAKE ONE TABLET BY MOUTH TWICE DAILY  . roflumilast (DALIRESP) 500 MCG TABS tablet Take 1 tablet (500 mcg total) by mouth daily.  . ticagrelor (BRILINTA) 60 MG TABS tablet Take 1 tablet (60 mg total) by mouth 2 (two) times daily.  Marland Kitchen tiotropium (SPIRIVA) 18 MCG inhalation capsule Place 1 capsule (18 mcg total) into inhaler and inhale daily.  Marland Kitchen albuterol (PROVENTIL) (2.5 MG/3ML) 0.083% nebulizer solution Take 3 mLs (2.5 mg total) by nebulization every 6 (six) hours as needed for wheezing or shortness of breath.  . predniSONE (DELTASONE) 20 MG tablet Take 1 tablet (20 mg total) by mouth daily with breakfast.   No facility-administered encounter medications on file as of 09/26/2015.    Past Medical History  Diagnosis Date  . Hypertension   . Anxiety   . COPD (chronic obstructive pulmonary disease) ([place])   . High cholesterol   . GERD (gastroesophageal reflux disease)   . Daily headache   . Chronic kidney disease (CKD), stage III (moderate)   . Coronary artery disease     a. 12/2013 PCI: mRCA 100% with L to R collats s/p PCI/DES;  b. 06/2014 MV: no ischemia/infarct, EF 53%;  c. 03/2015 Cath: LM nl, LAD nl, D1 80 (1.13mm), LCX 24m (<1.73mm), OM1 nl, RCA 55p, patent stent, RPDA nl, EF 65%.  . Depression   . Diastolic dysfunction     a. echo 12/2013: EF 55-60%, mild LVH, GR1DD, inf HK, elevated CVP, mildly dilated IVC suggestive of increased RA pressure  . Chronic chest pain   . Nicotine addiction     a. using eCigs.  . Heart attack ([place])   . Sleep apnea     Past Surgical History  Procedure Laterality Date  . Cardiac catheterization      MC x 1 stent  . Coronary angioplasty    . Left heart catheterization with coronary angiogram N/A 12/12/2013    Procedure: LEFT HEART CATHETERIZATION WITH CORONARY ANGIOGRAM;  Surgeon: Wellington Hampshire, MD;  Location: [facility]  [facility];  Service: Cardiovascular;  Laterality: N/A;  . Cardiac catheterization Left 03/12/2015    Procedure: Left Heart Cath and Coronary Angiography;  Surgeon: Leonie Man, MD;  Location: [facility];  Service: Cardiovascular;  Laterality: Left;    Social History  reports that he quit smoking about 6 years ago. His smoking use included Cigarettes and E-cigarettes. He has a 144 pack-year smoking history. He has never used smokeless tobacco. He reports that he drinks about 3.6 oz of alcohol per week. He reports that he does not use illicit drugs.  Family History family history includes Coronary artery disease in his brother; Heart disease in his father; Heart disease (age of onset: 34) in his brother. There is no history of Kidney disease or Prostate cancer.   Review of  Systems  Constitutional: Negative.   Respiratory: Positive for shortness of breath.   Cardiovascular: Positive for chest pain.  Gastrointestinal: Negative.   Musculoskeletal: Negative.   Skin: Negative.   Neurological: Negative.   Hematological: Negative.   Psychiatric/Behavioral: The patient is nervous/anxious.   All other systems reviewed and are negative.   BP 120/80 mmHg  Pulse 86  Ht 5\' 9"  (1.753 m)  Wt 185 lb (83.915 kg)  BMI 27.31 kg/m2  Physical Exam  Constitutional: He is oriented to person, place, and time. He appears well-developed and well-nourished.  HENT:  Head: Normocephalic.  Nose: Nose normal.  Mouth/Throat: Oropharynx is clear and moist.  Eyes: Conjunctivae are normal. Pupils are equal, round, and reactive to light.  Neck: Normal range of motion. Neck supple. No JVD present.  Cardiovascular: Normal rate, regular rhythm, S1 normal, S2 normal, normal heart sounds and intact distal pulses.  Exam reveals no gallop and no friction rub.   No murmur heard. Pulmonary/Chest: Effort normal. No respiratory distress. He has decreased breath sounds. He has no wheezes. He has no rales. He exhibits  no tenderness.  Abdominal: Soft. Bowel sounds are normal. He exhibits no distension. There is no tenderness.  Musculoskeletal: Normal range of motion. He exhibits no edema or tenderness.  Lymphadenopathy:    He has no cervical adenopathy.  Neurological: He is alert and oriented to person, place, and time. Coordination normal.  Skin: Skin is warm and dry. No rash noted. No erythema.  Psychiatric: He has a normal mood and affect. His behavior is normal. Judgment and thought content normal.      Assessment and Plan   Nursing note and vitals reviewed.

## 2015-09-26 NOTE — Telephone Encounter (Signed)
Ask wife that when pt comes in today to see Rockey Situ to have ladies up front to let me know when new insurance card is scanned in.

## 2015-09-26 NOTE — Telephone Encounter (Signed)
His insurance card is in the system. Its [employer]

## 2015-09-27 ENCOUNTER — Emergency Department: Payer: Medicare Other

## 2015-09-27 ENCOUNTER — Observation Stay
Admission: EM | Admit: 2015-09-27 | Discharge: 2015-09-29 | Disposition: A | Discharge status: Home / Self Care | Payer: Medicare Other | Attending: Internal Medicine | Admitting: Internal Medicine

## 2015-09-27 ENCOUNTER — Encounter: Payer: Self-pay | Admitting: Emergency Medicine

## 2015-09-27 DIAGNOSIS — Z9861 Coronary angioplasty status: Secondary | ICD-10-CM

## 2015-09-27 DIAGNOSIS — G4730 Sleep apnea, unspecified: Secondary | ICD-10-CM | POA: Insufficient documentation

## 2015-09-27 DIAGNOSIS — Z888 Allergy status to other drugs, medicaments and biological substances status: Secondary | ICD-10-CM | POA: Diagnosis not present

## 2015-09-27 DIAGNOSIS — F329 Major depressive disorder, single episode, unspecified: Secondary | ICD-10-CM | POA: Diagnosis not present

## 2015-09-27 DIAGNOSIS — I509 Heart failure, unspecified: Secondary | ICD-10-CM | POA: Insufficient documentation

## 2015-09-27 DIAGNOSIS — I25110 Atherosclerotic heart disease of native coronary artery with unstable angina pectoris: Secondary | ICD-10-CM | POA: Diagnosis not present

## 2015-09-27 DIAGNOSIS — Z881 Allergy status to other antibiotic agents status: Secondary | ICD-10-CM | POA: Insufficient documentation

## 2015-09-27 DIAGNOSIS — I129 Hypertensive chronic kidney disease with stage 1 through stage 4 chronic kidney disease, or unspecified chronic kidney disease: Secondary | ICD-10-CM | POA: Diagnosis not present

## 2015-09-27 DIAGNOSIS — Z79899 Other long term (current) drug therapy: Secondary | ICD-10-CM | POA: Insufficient documentation

## 2015-09-27 DIAGNOSIS — E7800 Pure hypercholesterolemia, unspecified: Secondary | ICD-10-CM | POA: Diagnosis not present

## 2015-09-27 DIAGNOSIS — Z7982 Long term (current) use of aspirin: Secondary | ICD-10-CM | POA: Insufficient documentation

## 2015-09-27 DIAGNOSIS — Z7952 Long term (current) use of systemic steroids: Secondary | ICD-10-CM | POA: Diagnosis not present

## 2015-09-27 DIAGNOSIS — N183 Chronic kidney disease, stage 3 unspecified: Secondary | ICD-10-CM | POA: Diagnosis present

## 2015-09-27 DIAGNOSIS — Z7951 Long term (current) use of inhaled steroids: Secondary | ICD-10-CM | POA: Insufficient documentation

## 2015-09-27 DIAGNOSIS — I252 Old myocardial infarction: Secondary | ICD-10-CM | POA: Diagnosis not present

## 2015-09-27 DIAGNOSIS — K219 Gastro-esophageal reflux disease without esophagitis: Secondary | ICD-10-CM | POA: Diagnosis not present

## 2015-09-27 DIAGNOSIS — I200 Unstable angina: Secondary | ICD-10-CM | POA: Diagnosis present

## 2015-09-27 DIAGNOSIS — Z955 Presence of coronary angioplasty implant and graft: Secondary | ICD-10-CM | POA: Insufficient documentation

## 2015-09-27 DIAGNOSIS — R079 Chest pain, unspecified: Principal | ICD-10-CM | POA: Insufficient documentation

## 2015-09-27 DIAGNOSIS — J449 Chronic obstructive pulmonary disease, unspecified: Secondary | ICD-10-CM | POA: Insufficient documentation

## 2015-09-27 DIAGNOSIS — I2510 Atherosclerotic heart disease of native coronary artery without angina pectoris: Secondary | ICD-10-CM

## 2015-09-27 DIAGNOSIS — E785 Hyperlipidemia, unspecified: Secondary | ICD-10-CM | POA: Diagnosis not present

## 2015-09-27 DIAGNOSIS — F17290 Nicotine dependence, other tobacco product, uncomplicated: Secondary | ICD-10-CM | POA: Diagnosis not present

## 2015-09-27 DIAGNOSIS — F419 Anxiety disorder, unspecified: Secondary | ICD-10-CM | POA: Insufficient documentation

## 2015-09-27 DIAGNOSIS — I10 Essential (primary) hypertension: Secondary | ICD-10-CM | POA: Diagnosis present

## 2015-09-27 DIAGNOSIS — I471 Supraventricular tachycardia: Secondary | ICD-10-CM | POA: Diagnosis present

## 2015-09-27 LAB — CBC
HCT: 39.4 % — ABNORMAL LOW (ref 40.0–52.0)
HEMOGLOBIN: 13.5 g/dL (ref 13.0–18.0)
MCH: 29.8 pg (ref 26.0–34.0)
MCHC: 34.3 g/dL (ref 32.0–36.0)
MCV: 86.8 fL (ref 80.0–100.0)
Platelets: 261 10*3/uL (ref 150–440)
RBC: 4.54 MIL/uL (ref 4.40–5.90)
RDW: 13.3 % (ref 11.5–14.5)
WBC: 7.9 10*3/uL (ref 3.8–10.6)

## 2015-09-27 LAB — BASIC METABOLIC PANEL
ANION GAP: 10 (ref 5–15)
BUN: 11 mg/dL (ref 6–20)
CALCIUM: 9.2 mg/dL (ref 8.9–10.3)
CO2: 23 mmol/L (ref 22–32)
Chloride: 103 mmol/L (ref 101–111)
Creatinine, Ser: 1.64 mg/dL — ABNORMAL HIGH (ref 0.61–1.24)
GFR calc Af Amer: 49 mL/min — ABNORMAL LOW (ref >60)
GFR calc non Af Amer: 42 mL/min — ABNORMAL LOW (ref >60)
GLUCOSE: 154 mg/dL — AB (ref 65–99)
Potassium: 4 mmol/L (ref 3.5–5.1)
Sodium: 136 mmol/L (ref 135–145)

## 2015-09-27 LAB — TROPONIN I

## 2015-09-27 MED ORDER — ASPIRIN 81 MG PO CHEW
324.0000 mg | CHEWABLE_TABLET | Freq: Once | ORAL | Status: AC
  Administered 2015-09-27: 324 mg via ORAL

## 2015-09-27 MED ORDER — ASPIRIN 81 MG PO CHEW
CHEWABLE_TABLET | ORAL | Status: AC
  Administered 2015-09-27: 324 mg via ORAL
  Filled 2015-09-27: qty 4

## 2015-09-27 NOTE — ED Notes (Signed)
Pt. States intermitant chest pain that started at 4 am this morning.  Pt. States feeling weak and diaphoretic.  Pt. States feeling subsided but returned later in the afternoon.  Pt. States taking two nitro pills one at 5 pm and one at 7:30 pm.  Pt. States seeing his heart doctor yesterday and was put on predisone.

## 2015-09-27 NOTE — ED Provider Notes (Signed)
[facility] Emergency Department Provider Note ____________________________________________  Time seen: ~2235  I have reviewed the triage vital signs and the nursing notes.   HISTORY  Chief Complaint Chest Pain   History limited by: Not Limited   HPI William Mueller is a 65 y.o. male with history of coronary artery disease status post stents and PCI presents to the emergency department today after chest pain. He states that he first started feeling bad this morning upon awakening. He describes going to the bathroom and then coming back and feeling clammy, nauseous. Did not have chest pain at that time. He has however had 2 further episodes today that have included chest pain. He describes it as being located on the left side of his chest. He states he has had shortness of breath. He states his last episode of chest pain was on his drive to the emergency department. He did take a nitroglycerin at that time which he states resolved the pain. Denies any recent fevers.    Past Medical History  Diagnosis Date  . Hypertension   . Anxiety   . COPD (chronic obstructive pulmonary disease) ([place])   . High cholesterol   . GERD (gastroesophageal reflux disease)   . Daily headache   . Chronic kidney disease (CKD), stage III (moderate)   . Coronary artery disease     a. 12/2013 PCI: mRCA 100% with L to R collats s/p PCI/DES;  b. 06/2014 MV: no ischemia/infarct, EF 53%;  c. 03/2015 Cath: LM nl, LAD nl, D1 80 (1.67mm), LCX 89m (<1.54mm), OM1 nl, RCA 55p, patent stent, RPDA nl, EF 65%.  . Depression   . Diastolic dysfunction     a. echo 12/2013: EF 55-60%, mild LVH, GR1DD, inf HK, elevated CVP, mildly dilated IVC suggestive of increased RA pressure  . Chronic chest pain   . Nicotine addiction     a. using e[employer]igs.  . Heart attack ([place])   . Sleep apnea     Patient Active Problem List   Diagnosis Date Noted  . Nicotine addiction   . CAD S/P PCI DES to RCA 03/11/2015  .  Diastolic dysfunction   . Chronic chest pain   . Chest pain at rest 11/23/2014  . GERD (gastroesophageal reflux disease) 06/08/2014  . CKD (chronic kidney disease) stage 3, GFR 30-59 ml/min 05/04/2014  . Bilateral leg pain 05/04/2014  . Lung nodule < 6cm on CT 05/04/2014  . Abnormal CT scan, kidney 05/04/2014  . Unstable angina ([place]) 05/04/2014  . Chest pain 03/22/2014  . Atrial tachycardia, paroxysmal ([place]) 12/14/2013  . Atherosclerosis of native coronary artery with unstable angina pectoris ([place]) 12/13/2013  . NSTEMI (non-ST elevated myocardial infarction) ([place]) 12/13/2013  . Essential hypertension 12/11/2013  . Acute renal failure superimposed on stage 3 chronic kidney disease ([place]) 12/11/2013  . Hyperlipidemia 12/11/2013  . COPD (chronic obstructive pulmonary disease) ([place]) 12/11/2013    Past Surgical History  Procedure Laterality Date  . Cardiac catheterization      MC x 1 stent  . Coronary angioplasty    . Left heart catheterization with coronary angiogram N/A 12/12/2013    Procedure: LEFT HEART CATHETERIZATION WITH CORONARY ANGIOGRAM;  Surgeon: Wellington Hampshire, MD;  Location: [facility] CATH LAB;  Service: Cardiovascular;  Laterality: N/A;  . Cardiac catheterization Left 03/12/2015    Procedure: Left Heart Cath and Coronary Angiography;  Surgeon: Leonie Man, MD;  Location: [place] CV LAB;  Service: Cardiovascular;  Laterality: Left;  Current Outpatient Rx  Name  Route  Sig  Dispense  Refill  . acetaminophen (TYLENOL) 500 MG tablet   Oral   Take 1,000 mg by mouth daily as needed for headache.          . albuterol (PROVENTIL HFA;VENTOLIN HFA) 108 (90 Base) MCG/ACT inhaler   Inhalation   Inhale 1-2 puffs into the lungs every 6 (six) hours as needed for wheezing or shortness of breath.   3 Inhaler   1   . albuterol (PROVENTIL) (2.5 MG/3ML) 0.083% nebulizer solution   Nebulization   Take 3 mLs (2.5 mg total) by nebulization every 6 (six) hours as needed for wheezing  or shortness of breath.   75 mL   12   . ALPRAZolam (XANAX XR) 3 MG 24 hr tablet   Oral   Take 3 mg by mouth every evening. For anxiety control         . ALPRAZolam (XANAX) 1 MG tablet   Oral   Take 1 mg by mouth daily.          Marland Kitchen aspirin 81 MG EC tablet   Oral   Take 1 tablet (81 mg total) by mouth daily.   90 tablet   3   . atorvastatin (LIPITOR) 40 MG tablet   Oral   Take 1 tablet (40 mg total) by mouth daily.   90 tablet   3   . budesonide-formoterol (SYMBICORT) 160-4.5 MCG/ACT inhaler   Inhalation   Inhale 2 puffs into the lungs 2 (two) times daily.   3 Inhaler   1   . ezetimibe (ZETIA) 10 MG tablet   Oral   Take 1 tablet (10 mg total) by mouth daily.   90 tablet   3   . hydrALAZINE (APRESOLINE) 25 MG tablet   Oral   Take 1 tablet (25 mg total) by mouth 3 (three) times daily as needed.   270 tablet   3   . metoprolol succinate (TOPROL-XL) 25 MG 24 hr tablet   Oral   Take 1 tablet (25 mg total) by mouth daily.   90 tablet   3   . nitroGLYCERIN (NITROSTAT) 0.4 MG SL tablet   Sublingual   Place 1 tablet (0.4 mg total) under the tongue every 5 (five) minutes as needed for chest pain. Call 911 after 3rd tablet   25 tablet   3   . omeprazole (PRILOSEC) 40 MG capsule   Oral   Take 1 capsule (40 mg total) by mouth daily.   90 capsule   3   . predniSONE (DELTASONE) 20 MG tablet   Oral   Take 1 tablet (20 mg total) by mouth daily with breakfast.   20 tablet   0   . RANEXA 500 MG 12 hr tablet      TAKE ONE TABLET BY MOUTH TWICE DAILY   60 tablet   3   . roflumilast (DALIRESP) 500 MCG TABS tablet   Oral   Take 1 tablet (500 mcg total) by mouth daily.   90 tablet   1   . ticagrelor (BRILINTA) 60 MG TABS tablet   Oral   Take 1 tablet (60 mg total) by mouth 2 (two) times daily.   60 tablet   11   . tiotropium (SPIRIVA) 18 MCG inhalation capsule   Inhalation   Place 1 capsule (18 mcg total) into inhaler and inhale daily.   90 capsule    1  Allergies Benzodiazepines; Paroxetine hcl; Serotonin reuptake inhibitors (ssris); Diazepam; Doxycycline monohydrate; Escitalopram oxalate; Isosorbide nitrate; Lorazepam; and Tetracyclines & related  Family History  Problem Relation Age of Onset  . Coronary artery disease Brother   . Heart disease Brother 54  . Heart disease Father     MI x 8  . Kidney disease Neg Hx   . Prostate cancer Neg Hx     Social History Social History  Substance Use Topics  . Smoking status: Former Smoker -- 3.00 packs/day for 48 years    Types: Cigarettes, E-cigarettes    Quit date: 06/07/2009  . Smokeless tobacco: Never Used     Comment: Using electronic cigarette  . Alcohol Use: 3.6 oz/week    6 Cans of beer per week    Review of Systems  Constitutional: Negative for fever. Cardiovascular: Positive for chest pain. Respiratory: Positive for shortness of breath. Gastrointestinal: Negative for abdominal pain, vomiting and diarrhea. Neurological: Negative for headaches, focal weakness or numbness.  10-point ROS otherwise negative.  ____________________________________________   PHYSICAL EXAM:  VITAL SIGNS: ED Triage Vitals  Enc Vitals Group     BP 09/27/15 2237 138/96 mmHg     Pulse Rate 09/27/15 2237 89     Resp 09/27/15 2237 18     Temp --      Temp src --      SpO2 09/27/15 2237 100 %     Weight --      Height --      Head Cir --      Peak Flow --      Pain Score 09/27/15 2013 3   Constitutional: Alert and oriented. Well appearing and in no distress. Eyes: Conjunctivae are normal. PERRL. Normal extraocular movements. ENT   Head: Normocephalic and atraumatic.   Nose: No congestion/rhinnorhea.   Mouth/Throat: Mucous membranes are moist.   Neck: No stridor. Hematological/Lymphatic/Immunilogical: No cervical lymphadenopathy. Cardiovascular: Normal rate, regular rhythm.  No murmurs, rubs, or gallops.No tenderness to palpation of the chest. Respiratory: Normal  respiratory effort without tachypnea nor retractions. Breath sounds are clear and equal bilaterally. No wheezes/rales/rhonchi. Gastrointestinal: Soft and nontender. No distention.  Genitourinary: Deferred Musculoskeletal: Normal range of motion in all extremities. No joint effusions.  No lower extremity tenderness nor edema. Neurologic:  Normal speech and language. No gross focal neurologic deficits are appreciated.  Skin:  Skin is warm, dry and intact. No rash noted. Psychiatric: Mood and affect are normal. Speech and behavior are normal. Patient exhibits appropriate insight and judgment.  ____________________________________________    LABS (pertinent positives/negatives)  Labs Reviewed  BASIC METABOLIC PANEL - Abnormal; Notable for the following:    Glucose, Bld 154 (*)    Creatinine, Ser 1.64 (*)    GFR calc non Af Amer 42 (*)    GFR calc Af Amer 49 (*)    All other components within normal limits  CBC - Abnormal; Notable for the following:    HCT 39.4 (*)    All other components within normal limits  TROPONIN I     ____________________________________________   EKG  I, Nance Pear, attending physician, personally viewed and interpreted this EKG  EKG Time: 2001 Rate: 115 Rhythm: sinus tachycardia Axis: normal Intervals: qtc 486 QRS: narrow ST changes: no st elevation Impression: abnormal ekg  ____________________________________________    RADIOLOGY  CXR  IMPRESSION: Unchanged hyperinflation. No localizing process.  ____________________________________________   PROCEDURES  Procedure(s) performed: None  Critical Care performed: No  ____________________________________________   INITIAL IMPRESSION /  ASSESSMENT AND PLAN / ED COURSE  Pertinent labs & imaging results that were available during my care of the patient were reviewed by me and considered in my medical decision making (see chart for details).  Patient with a history of coronary  artery disease status post PCI presents to the emergency department today after a couple episodes of central left-sided chest pain today. His chest pain did resolve with nitroglycerin. He does have some concerning features including the shortness of breath. Given that he is high risk will plan on admission to the hospital service for further evaluation  ____________________________________________   FINAL CLINICAL IMPRESSION(S) / ED DIAGNOSES  Final diagnoses:  Chest pain, unspecified chest pain type     Nance Pear, MD 09/27/15 2309

## 2015-09-28 ENCOUNTER — Observation Stay (HOSPITAL_BASED_OUTPATIENT_CLINIC_OR_DEPARTMENT_OTHER)
Admit: 2015-09-28 | Discharge: 2015-09-28 | Disposition: A | Discharge status: Home / Self Care | Payer: Medicare Other | Attending: Internal Medicine | Admitting: Internal Medicine

## 2015-09-28 DIAGNOSIS — I200 Unstable angina: Secondary | ICD-10-CM

## 2015-09-28 DIAGNOSIS — N183 Chronic kidney disease, stage 3 (moderate): Secondary | ICD-10-CM | POA: Diagnosis not present

## 2015-09-28 DIAGNOSIS — I2510 Atherosclerotic heart disease of native coronary artery without angina pectoris: Secondary | ICD-10-CM | POA: Diagnosis not present

## 2015-09-28 DIAGNOSIS — R079 Chest pain, unspecified: Secondary | ICD-10-CM

## 2015-09-28 DIAGNOSIS — I10 Essential (primary) hypertension: Secondary | ICD-10-CM | POA: Diagnosis not present

## 2015-09-28 DIAGNOSIS — E785 Hyperlipidemia, unspecified: Secondary | ICD-10-CM | POA: Diagnosis not present

## 2015-09-28 LAB — ECHOCARDIOGRAM COMPLETE
Height: 69 in
Weight: 2944 oz

## 2015-09-28 LAB — CBC
HCT: 38.5 % — ABNORMAL LOW (ref 40.0–52.0)
Hemoglobin: 13 g/dL (ref 13.0–18.0)
MCH: 29.3 pg (ref 26.0–34.0)
MCHC: 33.8 g/dL (ref 32.0–36.0)
MCV: 86.6 fL (ref 80.0–100.0)
PLATELETS: 236 10*3/uL (ref 150–440)
RBC: 4.45 MIL/uL (ref 4.40–5.90)
RDW: 13.2 % (ref 11.5–14.5)
WBC: 10.8 10*3/uL — AB (ref 3.8–10.6)

## 2015-09-28 LAB — BASIC METABOLIC PANEL
Anion gap: 8 (ref 5–15)
BUN: 11 mg/dL (ref 6–20)
CALCIUM: 9 mg/dL (ref 8.9–10.3)
CHLORIDE: 105 mmol/L (ref 101–111)
CO2: 26 mmol/L (ref 22–32)
Creatinine, Ser: 1.61 mg/dL — ABNORMAL HIGH (ref 0.61–1.24)
GFR calc Af Amer: 50 mL/min — ABNORMAL LOW (ref >60)
GFR calc non Af Amer: 43 mL/min — ABNORMAL LOW (ref >60)
Glucose, Bld: 112 mg/dL — ABNORMAL HIGH (ref 65–99)
Potassium: 3.8 mmol/L (ref 3.5–5.1)
SODIUM: 139 mmol/L (ref 135–145)

## 2015-09-28 LAB — TROPONIN I: Troponin I: <0.03 ng/mL (ref <0.031)

## 2015-09-28 MED ORDER — ROFLUMILAST 500 MCG PO TABS
500.0000 ug | ORAL_TABLET | Freq: Every day | ORAL | Status: DC
  Administered 2015-09-28: 500 ug via ORAL
  Filled 2015-09-28: qty 1

## 2015-09-28 MED ORDER — ALBUTEROL SULFATE HFA 108 (90 BASE) MCG/ACT IN AERS: 1.0000 | INHALATION_SPRAY | Freq: Four times a day (QID) | RESPIRATORY_TRACT | Status: DC | PRN

## 2015-09-28 MED ORDER — AMLODIPINE BESYLATE 5 MG PO TABS
5.0000 mg | ORAL_TABLET | Freq: Every day | ORAL | Status: DC
  Administered 2015-09-28: 5 mg via ORAL
  Filled 2015-09-28: qty 1

## 2015-09-28 MED ORDER — TICAGRELOR 60 MG PO TABS
60.0000 mg | ORAL_TABLET | Freq: Two times a day (BID) | ORAL | Status: DC
  Filled 2015-09-28 (×4): qty 1

## 2015-09-28 MED ORDER — TIOTROPIUM BROMIDE MONOHYDRATE 18 MCG IN CAPS
18.0000 ug | ORAL_CAPSULE | Freq: Every day | RESPIRATORY_TRACT | Status: DC
  Administered 2015-09-28: 18 ug via RESPIRATORY_TRACT
  Filled 2015-09-28: qty 5

## 2015-09-28 MED ORDER — ALBUTEROL SULFATE (2.5 MG/3ML) 0.083% IN NEBU: 2.5000 mg | INHALATION_SOLUTION | Freq: Four times a day (QID) | RESPIRATORY_TRACT | Status: DC | PRN

## 2015-09-28 MED ORDER — ACETAMINOPHEN 650 MG RE SUPP
650.0000 mg | Freq: Four times a day (QID) | RECTAL | Status: DC | PRN
Start: 2015-09-28 — End: 2015-09-29

## 2015-09-28 MED ORDER — NITROGLYCERIN 0.4 MG SL SUBL
0.4000 mg | SUBLINGUAL_TABLET | SUBLINGUAL | Status: DC | PRN
  Administered 2015-09-28 (×2): 0.4 mg via SUBLINGUAL
  Filled 2015-09-28 (×2): qty 1

## 2015-09-28 MED ORDER — ATORVASTATIN CALCIUM 20 MG PO TABS: 80.0000 mg | ORAL_TABLET | Freq: Every day | ORAL | Status: DC

## 2015-09-28 MED ORDER — TICAGRELOR 60 MG PO TABS
60.0000 mg | ORAL_TABLET | Freq: Two times a day (BID) | ORAL | Status: DC
  Administered 2015-09-28 (×2): 60 mg via ORAL
  Filled 2015-09-28: qty 1

## 2015-09-28 MED ORDER — MORPHINE SULFATE (PF) 2 MG/ML IV SOLN: 2.0000 mg | INTRAVENOUS | Status: DC | PRN

## 2015-09-28 MED ORDER — ALPRAZOLAM ER 1 MG PO TB24
3.0000 mg | ORAL_TABLET | Freq: Every evening | ORAL | Status: DC
  Administered 2015-09-28: 3 mg via ORAL
  Filled 2015-09-28: qty 3

## 2015-09-28 MED ORDER — ONDANSETRON HCL 4 MG PO TABS: 4.0000 mg | ORAL_TABLET | Freq: Four times a day (QID) | ORAL | Status: DC | PRN

## 2015-09-28 MED ORDER — SODIUM CHLORIDE 0.9 % IV SOLN
INTRAVENOUS | Status: DC
  Administered 2015-09-28 (×2): via INTRAVENOUS

## 2015-09-28 MED ORDER — ENOXAPARIN SODIUM 40 MG/0.4ML ~~LOC~~ SOLN
40.0000 mg | SUBCUTANEOUS | Status: DC
  Administered 2015-09-28: 40 mg via SUBCUTANEOUS
  Filled 2015-09-28: qty 0.4

## 2015-09-28 MED ORDER — SODIUM CHLORIDE 0.9% FLUSH
3.0000 mL | Freq: Two times a day (BID) | INTRAVENOUS | Status: DC
  Administered 2015-09-28: 3 mL via INTRAVENOUS

## 2015-09-28 MED ORDER — MOMETASONE FURO-FORMOTEROL FUM 200-5 MCG/ACT IN AERO
2.0000 | INHALATION_SPRAY | Freq: Two times a day (BID) | RESPIRATORY_TRACT | Status: DC
  Administered 2015-09-28 (×3): 2 via RESPIRATORY_TRACT
  Filled 2015-09-28: qty 8.8

## 2015-09-28 MED ORDER — METOPROLOL SUCCINATE ER 25 MG PO TB24: 25.0000 mg | ORAL_TABLET | Freq: Every day | ORAL | Status: DC

## 2015-09-28 MED ORDER — HYDRALAZINE HCL 25 MG PO TABS: 25.0000 mg | ORAL_TABLET | Freq: Three times a day (TID) | ORAL | Status: DC | PRN

## 2015-09-28 MED ORDER — PANTOPRAZOLE SODIUM 40 MG PO TBEC
40.0000 mg | DELAYED_RELEASE_TABLET | Freq: Every day | ORAL | Status: DC
  Administered 2015-09-28: 40 mg via ORAL
  Filled 2015-09-28: qty 1

## 2015-09-28 MED ORDER — ATORVASTATIN CALCIUM 20 MG PO TABS
40.0000 mg | ORAL_TABLET | Freq: Every day | ORAL | Status: DC
  Administered 2015-09-28: 40 mg via ORAL
  Filled 2015-09-28: qty 2

## 2015-09-28 MED ORDER — ASPIRIN EC 81 MG PO TBEC
81.0000 mg | DELAYED_RELEASE_TABLET | Freq: Every day | ORAL | Status: DC
  Administered 2015-09-28: 81 mg via ORAL
  Filled 2015-09-28: qty 1

## 2015-09-28 MED ORDER — ONDANSETRON HCL 4 MG/2ML IJ SOLN: 4.0000 mg | Freq: Four times a day (QID) | INTRAMUSCULAR | Status: DC | PRN

## 2015-09-28 MED ORDER — METOPROLOL SUCCINATE ER 25 MG PO TB24
25.0000 mg | ORAL_TABLET | Freq: Every day | ORAL | Status: DC
  Administered 2015-09-28: 25 mg via ORAL
  Filled 2015-09-28: qty 1

## 2015-09-28 MED ORDER — ALPRAZOLAM 1 MG PO TABS
1.0000 mg | ORAL_TABLET | Freq: Every day | ORAL | Status: DC
  Administered 2015-09-28: 1 mg via ORAL
  Filled 2015-09-28: qty 1

## 2015-09-28 MED ORDER — PREDNISONE 20 MG PO TABS
20.0000 mg | ORAL_TABLET | Freq: Every day | ORAL | Status: DC
  Administered 2015-09-28: 20 mg via ORAL
  Filled 2015-09-28: qty 1

## 2015-09-28 MED ORDER — ACETAMINOPHEN 325 MG PO TABS
650.0000 mg | ORAL_TABLET | Freq: Four times a day (QID) | ORAL | Status: DC | PRN
  Administered 2015-09-29: 650 mg via ORAL
  Filled 2015-09-28: qty 2
  Filled 2015-09-28: qty 1

## 2015-09-28 NOTE — Progress Notes (Signed)
Sudden onset CP, 2 nitro given and Dr Lavetta Nielsen called to room, pt tachy in 120-130 range, order morphine, MD Hower will order EKG

## 2015-09-28 NOTE — Progress Notes (Signed)
Per patient his pulmonologist recently prescribed a steroid taper, however we have noted this as "20 mg daily" in PTA meds, and it is ordered that way here.  Per MD Crenshaw's note he is under the impresssion the patient is on a taper.  This RN paged Dr Lavetta Nielsen to ask about this, but have not received a call back.  Will pass on in report to be dealt with tomorrow AM with rounding physician

## 2015-09-28 NOTE — Progress Notes (Signed)
[facility] NAME: William Mueller    MRN#:  [id number]  DATE OF BIRTH:  [birth year]  SUBJECTIVE:  Hospital Day: none William Mueller is a 65 y.o. male presenting with Chest Pain .   Overnight events: No overnight events Interval Events: Called to bedside given patient with chest pain, describes retrosternal crushing pressure worse than on admission, nonradiating and no worsening or relieving factors  REVIEW OF SYSTEMS:  CONSTITUTIONAL: No fever, fatigue or weakness.  EYES: No blurred or double vision.  EARS, NOSE, AND THROAT: No tinnitus or ear pain.  RESPIRATORY: No cough, shortness of breath, wheezing or hemoptysis.  CARDIOVASCULAR: Positive chest pain, denies orthopnea, edema.  GASTROINTESTINAL: No nausea, vomiting, diarrhea or abdominal pain.  GENITOURINARY: No dysuria, hematuria.  ENDOCRINE: No polyuria, nocturia,  HEMATOLOGY: No anemia, easy bruising or bleeding SKIN: No rash or lesion. MUSCULOSKELETAL: No joint pain or arthritis.   NEUROLOGIC: No tingling, numbness, weakness.  PSYCHIATRY: No anxiety or depression.   DRUG ALLERGIES:   Allergies  Allergen Reactions  . Benzodiazepines Shortness Of Breath    Patient is on Xanax  . Paroxetine Hcl Shortness Of Breath and Palpitations  . Serotonin Reuptake Inhibitors (Ssris) Shortness Of Breath and Palpitations  . Diazepam Other (See Comments)    Rapid heartrate  . Doxycycline Monohydrate Other (See Comments)    Unknown allergic reaction  . Escitalopram Oxalate Other (See Comments)    serotonin Syndrome  . Isosorbide Nitrate Other (See Comments)    Feels like head was going to explode -- HA  . Lorazepam Other (See Comments)    Unknown allergic reaction  . Tetracyclines & Related Itching and Rash    VITALS:  Blood pressure 157/87, pulse 123, temperature 97.6 F (36.4 C), temperature source Oral, resp. rate 18, height 5\' 9"  (1.753 m), weight 83.462 kg (184 lb), SpO2  98 %.  PHYSICAL EXAMINATION:  VITAL SIGNS: Filed Vitals:   09/28/15 0923 09/28/15 0956  BP: 157/87   Pulse: 103 123  Temp: 97.6 F (36.4 C)   Resp: 18    GENERAL:65 y.o.male currently in Moderate acute distress.  HEAD: Normocephalic, atraumatic.  EYES: Pupils equal, round, reactive to light. Extraocular muscles intact. No scleral icterus.  MOUTH: Moist mucosal membrane. Dentition intact. No abscess noted.  EAR, NOSE, THROAT: Clear without exudates. No external lesions.  NECK: Supple. No thyromegaly. No nodules. No JVD.  PULMONARY: Clear to ascultation, without wheeze rails or rhonci. No use of accessory muscles, Good respiratory effort. good air entry bilaterally CHEST: Nontender to palpation.  CARDIOVASCULAR: S1 and S2. Tachycardic. No murmurs, rubs, or gallops. No edema. Pedal pulses 2+ bilaterally.  GASTROINTESTINAL: Soft, nontender, nondistended. No masses. Positive bowel sounds. No hepatosplenomegaly.  MUSCULOSKELETAL: No swelling, clubbing, or edema. Range of motion full in all extremities.  NEUROLOGIC: Cranial nerves II through XII are intact. No gross focal neurological deficits. Sensation intact. Reflexes intact.  SKIN: No ulceration, lesions, rashes, or cyanosis. Skin warm and dry. Turgor intact.  PSYCHIATRIC: Mood, affect within normal limits. The patient is awake, alert and oriented x 3. Insight, judgment intact.      LABORATORY PANEL:   CBC  Recent Labs Lab 09/28/15 0152  WBC 10.8*  HGB 13.0  HCT 38.5*  PLT 236   ------------------------------------------------------------------------------------------------------------------  Chemistries   Recent Labs Lab 09/28/15 0152  NA 139  K 3.8  CL 105  CO2 26  GLUCOSE 112*  BUN 11  CREATININE 1.61*  CALCIUM 9.0   ------------------------------------------------------------------------------------------------------------------  Cardiac Enzymes  Recent Labs Lab 09/28/15 0705  TROPONINI <0.03    ------------------------------------------------------------------------------------------------------------------  RADIOLOGY:  Dg Chest 2 View  09/27/2015  CLINICAL DATA:  Awoke with chest pain yesterday with resolution, onset of recurrent left-sided chest pain this evening. EXAM: CHEST  2 VIEW COMPARISON:  03/01/2015 FINDINGS: Hyperinflation is unchanged. The cardiomediastinal contours are normal. Coronary stent is seen. Pulmonary vasculature is normal. No consolidation, pleural effusion, or pneumothorax. No acute osseous abnormalities are seen. IMPRESSION: Unchanged hyperinflation.  No localizing process. Electronically Signed   By: Jeb Levering M.D.   On: 09/27/2015 20:37    EKG:   Orders placed or performed during the hospital encounter of 09/27/15  . EKG 12-Lead  . EKG 12-Lead  . ED EKG within 10 minutes  . ED EKG within 10 minutes  . EKG 12-Lead  . EKG 12-Lead    ASSESSMENT AND PLAN:   William Mueller is a 65 y.o. male presenting with Chest Pain . Admitted 09/27/2015 : Day #: none  1. Chest pain, central: Initiate aspirin and statin therapy, admitted to telemetry, trend cardiac enzymes 3,  if continued elevation will initiate heparin drip ,nitroglycerin when necessary, morphine when necessary, consult cardiology follows with Dr. Rockey Situ Given acute onset of pain received nitroglycerin 2 morphine repeat EKG performed bedside read normal sinus rhythm and no significant ST-T wave abnormality we'll continue to follow troponin Patient has not received aspirin in the correct dosing wife going home to get correct medicine 2. COPD not in acute exacerbation continue with Spiriva and Symbicort 3. Essential hypertension metoprolol hydralazine 4. Hyperlipidemia unspecified Lipitor, Zetia 5. Venous thromboembolism prophylactic: Lovenox   All the records are reviewed and case discussed with Care Management/Social Workerr. Management plans discussed with the patient, family and they are  in agreement.  CODE STATUS: full TOTAL TIME TAKING CARE OF THIS PATIENT: 33 minutes.   POSSIBLE D/C IN 1-2DAYS, DEPENDING ON CLINICAL CONDITION.   Hower,  Karenann Cai.D on 09/28/2015 at 11:11 AM  Between 7am to 6pm - Pager - [phone]  After 6pm: House Pager: - [phone]  [facility]  Office  [phone]  CC: Primary care physician; Leonides Sake, MD

## 2015-09-28 NOTE — Consult Note (Signed)
Primary cardiologist: Dr Rockey Situ  HPI: 65 year old male with past medical history of coronary artery disease, chronic CP, renal insufficiency, COPD, hypertension, hyperlipidemia for evaluation of chest pain. Patient had PCI of right coronary artery in July 2015. Nuclear study 1/16 showed EF 53 with no ischemia or infarction. Repeat cath in Oct 2016 showed a 55% proximal RCA,80% first diagonal, 90% mid to distal circumflex and normal LV function. The diagonal and circumflex vessels were small and felt not to be amenable to PCI. Medical therapy recommended. Echocardiogram in Nov 2016 showed normal LV systolic function and grade 1 diastolic dysfunction. Pt seen by Dr Rockey Situ in office 09/26/15 and felt to have COPD exacerbation. Prednisone taper added. The patient states he has chest pain daily at home. He had chest pain at approximately 5 PM yesterday. The pain was substernal and described as pressure and sharp. It was not pleuritic, positional, exertional or related to food. No radiation. No nausea but he did have dyspnea. Resolved after 20 minutes with 2 sublingual nitroglycerin. He had recurrent pain last evening. Presently pain free.He has some dyspnea on exertion but no orthopnea, PND, pedal edema or syncope. Because of the above we were asked to evaluate.  Medications Prior to Admission  Medication Sig Dispense Refill  . acetaminophen (TYLENOL) 500 MG tablet Take 1,000 mg by mouth daily as needed for headache.     . albuterol (PROVENTIL HFA;VENTOLIN HFA) 108 (90 Base) MCG/ACT inhaler Inhale 1-2 puffs into the lungs every 6 (six) hours as needed for wheezing or shortness of breath. 3 Inhaler 1  . albuterol (PROVENTIL) (2.5 MG/3ML) 0.083% nebulizer solution Take 3 mLs (2.5 mg total) by nebulization every 6 (six) hours as needed for wheezing or shortness of breath. 75 mL 12  . ALPRAZolam (XANAX XR) 3 MG 24 hr tablet Take 3 mg by mouth every evening.     Marland Kitchen ALPRAZolam (XANAX) 1 MG tablet Take 1 mg by  mouth daily.     Marland Kitchen aspirin 81 MG EC tablet Take 1 tablet (81 mg total) by mouth daily. 90 tablet 3  . atorvastatin (LIPITOR) 40 MG tablet Take 1 tablet (40 mg total) by mouth daily. 90 tablet 3  . budesonide-formoterol (SYMBICORT) 160-4.5 MCG/ACT inhaler Inhale 2 puffs into the lungs 2 (two) times daily. 3 Inhaler 1  . ezetimibe (ZETIA) 10 MG tablet Take 1 tablet (10 mg total) by mouth daily. 90 tablet 3  . hydrALAZINE (APRESOLINE) 25 MG tablet Take 1 tablet (25 mg total) by mouth 3 (three) times daily as needed. (Patient taking differently: Take 25 mg by mouth 3 (three) times daily as needed (DIASTOLIC PRESSURE >68). ) 270 tablet 3  . metoprolol succinate (TOPROL-XL) 25 MG 24 hr tablet Take 1 tablet (25 mg total) by mouth daily. 90 tablet 3  . nitroGLYCERIN (NITROSTAT) 0.4 MG SL tablet Place 1 tablet (0.4 mg total) under the tongue every 5 (five) minutes as needed for chest pain. Call 911 after 3rd tablet 25 tablet 3  . omeprazole (PRILOSEC) 40 MG capsule Take 1 capsule (40 mg total) by mouth daily. 90 capsule 3  . predniSONE (DELTASONE) 20 MG tablet Take 1 tablet (20 mg total) by mouth daily with breakfast. 20 tablet 0  . roflumilast (DALIRESP) 500 MCG TABS tablet Take 1 tablet (500 mcg total) by mouth daily. 90 tablet 1  . ticagrelor (BRILINTA) 60 MG TABS tablet Take 1 tablet (60 mg total) by mouth 2 (two) times daily. 60 tablet 11  . tiotropium (  SPIRIVA) 18 MCG inhalation capsule Place 1 capsule (18 mcg total) into inhaler and inhale daily. 90 capsule 1  . RANEXA 500 MG 12 hr tablet TAKE ONE TABLET BY MOUTH TWICE DAILY (Patient not taking: Reported on 09/27/2015) 60 tablet 3    Allergies  Allergen Reactions  . Benzodiazepines Shortness Of Breath    Patient is on Xanax  . Paroxetine Hcl Shortness Of Breath and Palpitations  . Serotonin Reuptake Inhibitors (Ssris) Shortness Of Breath and Palpitations  . Diazepam Other (See Comments)    Rapid heartrate  . Doxycycline Monohydrate Other (See  Comments)    Unknown allergic reaction  . Escitalopram Oxalate Other (See Comments)    serotonin Syndrome  . Isosorbide Nitrate Other (See Comments)    Feels like head was going to explode -- HA  . Lorazepam Other (See Comments)    Unknown allergic reaction  . Tetracyclines & Related Itching and Rash     Past Medical History  Diagnosis Date  . Hypertension   . Anxiety   . COPD (chronic obstructive pulmonary disease) ([place])   . High cholesterol   . GERD (gastroesophageal reflux disease)   . Daily headache   . Chronic kidney disease (CKD), stage III (moderate)   . Coronary artery disease     a. 12/2013 PCI: mRCA 100% with L to R collats s/p PCI/DES;  b. 06/2014 MV: no ischemia/infarct, EF 53%;  c. 03/2015 Cath: LM nl, LAD nl, D1 80 (1.70m), LCX 969m<1.49m53m OM1 nl, RCA 55p, patent stent, RPDA nl, EF 65%.  . Depression   . Diastolic dysfunction     a. echo 12/2013: EF 55-60%, mild LVH, GR1DD, inf HK, elevated CVP, mildly dilated IVC suggestive of increased RA pressure  . Chronic chest pain   . Nicotine addiction     a. using eCigs.  . Sleep apnea     Past Surgical History  Procedure Laterality Date  . Cardiac catheterization      MC x 1 stent  . Coronary angioplasty    . Left heart catheterization with coronary angiogram N/A 12/12/2013    Procedure: LEFT HEART CATHETERIZATION WITH CORONARY ANGIOGRAM;  Surgeon: MuhWellington HampshireD;  Location[facility];  Service: Cardiovascular;  Laterality: N/A;  . Cardiac catheterization Left 03/12/2015    Procedure: Left Heart Cath and Coronary Angiography;  Surgeon: DavLeonie ManD;  Location[facility];  Service: Cardiovascular;  Laterality: Left;    Social History   Social History  . Marital Status: Married    Spouse Name: N/A  . Number of Children: N/A  . Years of Education: N/A   Occupational History  . Not on file.   Social History Main Topics  . Smoking status: Former Smoker -- 3.00 packs/day for 48 years     Types: Cigarettes, E-cigarettes    Quit date: 06/07/2009  . Smokeless tobacco: Never Used     Comment: Using electronic cigarette  . Alcohol Use: 3.6 oz/week    6 Cans of beer per week     Comment: Occasional  . Drug Use: No  . Sexual Activity: Not Currently   Other Topics Concern  . Not on file   Social History Narrative    Family History  Problem Relation Age of Onset  . Coronary artery disease Brother   . Heart disease Brother 72 34 Heart disease Father     MI x 8  . Kidney disease Neg Hx   . Prostate cancer  Neg Hx     ROS:  Chronic productive cough in the morning but no fevers or chills, hemoptysis, dysphasia, odynophagia, melena, hematochezia, dysuria, hematuria, rash, seizure activity, orthopnea, PND, pedal edema, claudication. Remaining systems are negative.  Physical Exam:   Blood pressure 128/71, pulse 104, temperature 98.7 F (37.1 C), temperature source Oral, resp. rate 22, height _0  (1.753 m), weight 184 lb (83.462 kg), SpO2 97 %.  General:  Well developed/well nourished in NAD Skin warm/dry Patient not depressed No peripheral clubbing Back-normal HEENT-normal/normal eyelids Neck supple/normal carotid upstroke bilaterally; no bruits; no JVD; no thyromegaly chest - Diminished BS throughout CV - RRR/normal S1 and S2; no murmurs, rubs or gallops;  PMI nondisplaced Abdomen -NT/ND, no HSM, no mass, + bowel sounds, no bruit 2+ femoral pulses, no bruits Ext-no edema, chords, 2+ DP Neuro-grossly nonfocal  ECG Sinus tachycardia with no ST changes. Follow-up electrocardiogram showed sinus tachycardia with nonspecific ST changes.  Results for orders placed or performed during the hospital encounter of 09/27/15 (from the past 48 hour(s))  Basic metabolic panel     Status: Abnormal   Collection Time: 09/27/15  8:19 PM  Result Value Ref Range   Sodium 136 135 - 145 mmol/L   Potassium 4.0 3.5 - 5.1 mmol/L   Chloride 103 101 - 111 mmol/L   CO2 23 22 - 32 mmol/L     Glucose, Bld 154 (H) 65 - 99 mg/dL   BUN 11 6 - 20 mg/dL   Creatinine, Ser 1.64 (H) 0.61 - 1.24 mg/dL   Calcium 9.2 8.9 - 10.3 mg/dL   GFR calc non Af Amer 42 (L) >60 mL/min   GFR calc Af Amer 49 (L) >60 mL/min    Comment: (NOTE) The eGFR has been calculated using the CKD EPI equation. This calculation has not been validated in all clinical situations. eGFR's persistently <60 mL/min signify possible Chronic Kidney Disease.    Anion gap 10 5 - 15  CBC     Status: Abnormal   Collection Time: 09/27/15  8:19 PM  Result Value Ref Range   WBC 7.9 3.8 - 10.6 K/uL   RBC 4.54 4.40 - 5.90 MIL/uL   Hemoglobin 13.5 13.0 - 18.0 g/dL   HCT 39.4 (L) 40.0 - 52.0 %   MCV 86.8 80.0 - 100.0 fL   MCH 29.8 26.0 - 34.0 pg   MCHC 34.3 32.0 - 36.0 g/dL   RDW 13.3 11.5 - 14.5 %   Platelets 261 150 - 440 K/uL  Troponin I     Status: None   Collection Time: 09/27/15  8:19 PM  Result Value Ref Range   Troponin I <0.03 <0.031 ng/mL    Comment:        NO INDICATION OF MYOCARDIAL INJURY.   Troponin I     Status: None   Collection Time: 09/28/15  1:52 AM  Result Value Ref Range   Troponin I <0.03 <0.031 ng/mL    Comment:        NO INDICATION OF MYOCARDIAL INJURY.   Basic metabolic panel     Status: Abnormal   Collection Time: 09/28/15  1:52 AM  Result Value Ref Range   Sodium 139 135 - 145 mmol/L   Potassium 3.8 3.5 - 5.1 mmol/L   Chloride 105 101 - 111 mmol/L   CO2 26 22 - 32 mmol/L   Glucose, Bld 112 (H) 65 - 99 mg/dL   BUN 11 6 - 20 mg/dL   Creatinine, Ser 1.61 (  H) 0.61 - 1.24 mg/dL   Calcium 9.0 8.9 - 10.3 mg/dL   GFR calc non Af Amer 43 (L) >60 mL/min   GFR calc Af Amer 50 (L) >60 mL/min    Comment: (NOTE) The eGFR has been calculated using the CKD EPI equation. This calculation has not been validated in all clinical situations. eGFR's persistently <60 mL/min signify possible Chronic Kidney Disease.    Anion gap 8 5 - 15  CBC     Status: Abnormal   Collection Time: 09/28/15   1:52 AM  Result Value Ref Range   WBC 10.8 (H) 3.8 - 10.6 K/uL   RBC 4.45 4.40 - 5.90 MIL/uL   Hemoglobin 13.0 13.0 - 18.0 g/dL   HCT 38.5 (L) 40.0 - 52.0 %   MCV 86.6 80.0 - 100.0 fL   MCH 29.3 26.0 - 34.0 pg   MCHC 33.8 32.0 - 36.0 g/dL   RDW 13.2 11.5 - 14.5 %   Platelets 236 150 - 440 K/uL  Troponin I     Status: None   Collection Time: 09/28/15  7:05 AM  Result Value Ref Range   Troponin I <0.03 <0.031 ng/mL    Comment:        NO INDICATION OF MYOCARDIAL INJURY.     Dg Chest 2 View  09/27/2015  CLINICAL DATA:  Awoke with chest pain yesterday with resolution, onset of recurrent left-sided chest pain this evening. EXAM: CHEST  2 VIEW COMPARISON:  03/01/2015 FINDINGS: Hyperinflation is unchanged. The cardiomediastinal contours are normal. Coronary stent is seen. Pulmonary vasculature is normal. No consolidation, pleural effusion, or pneumothorax. No acute osseous abnormalities are seen. IMPRESSION: Unchanged hyperinflation.  No localizing process. Electronically Signed   By: Jeb Levering M.D.   On: 09/27/2015 20:37    Assessment/Plan 1 chest pain-symptoms with both typical and atypical features. He had PCI of his right coronary artery in July 2015. He had a nuclear study in January 2016 that was negative and a cardiac catheterization in October 2016 that showed lesions in small vessels not amenable to PCI. He has daily chest pain at home by his report. He also has baseline renal insufficiency that would increase the risk of cardiac catheterization. I will arrange a stress nuclear study tomorrow morning. If normal or low risk I would favor medical therapy. Add amlodipine 5 mg daily. He has had headaches with isosorbide in the past. 2 coronary artery disease-continue ASA and statin. Will review with Dr Rockey Situ; brilinta could likely be DCed as PCI was in 7/15. 3 hyperlipidemia-given documented coronary artery disease I will increase Lipitor to 80 mg daily. Check lipids and liver in 4  weeks. 4 COPD-Continue steroid taper and bronchodilators. 5 hypertension-continue present medications but discontinue hydralazine as needed. 6 chronic stage III kidney disease-if patient requires cardiac catheterization he would need to be hydrated prior to the procedure with close follow-up of renal function afterwards.  Kirk Ruths MD 09/28/2015, 12:16 PM

## 2015-09-28 NOTE — H&P (Signed)
[facility] NAME: Jimmylee Strahle    MR#:  [id number]  DATE OF BIRTH:  06/12/1950  DATE OF ADMISSION:  09/27/2015  PRIMARY CARE PHYSICIAN: Leonides Sake, MD   REQUESTING/REFERRING PHYSICIAN: Archie Balboa, MD  CHIEF COMPLAINT:   Chief Complaint  Patient presents with  . Chest Pain    Pt. states intermitant chest pain that started at 4 am this morning.  Pt. has hx of previous heart attack with stent placement last year.    HISTORY OF PRESENT ILLNESS:  Randel Narasimhan  is a 65 y.o. male who presents with 2 episodes of angina. She has a known history of coronary disease with prior cardiac Stent placement. He states that he had an episode of angina around 4:00 on the day of presentation to the ED which lasted about 30-45 minutes. This wasn't associated with diaphoresis and some mild shortness of breath. He describes the pain as centrally located, and "heavy." It improved with rest. However, he had another episode around 7:30, which was not relieved until he took nitroglycerin. He came to the ED for evaluation. Initial workup largely negative, though given his significant risk factors hospitalists were called for admission and further evaluation.  PAST MEDICAL HISTORY:   Past Medical History  Diagnosis Date  . Hypertension   . Anxiety   . COPD (chronic obstructive pulmonary disease) ([place])   . High cholesterol   . GERD (gastroesophageal reflux disease)   . Daily headache   . Chronic kidney disease (CKD), stage III (moderate)   . Coronary artery disease     a. 12/2013 PCI: mRCA 100% with L to R collats s/p PCI/DES;  b. 06/2014 MV: no ischemia/infarct, EF 53%;  c. 03/2015 Cath: LM nl, LAD nl, D1 80 (1.46mm), LCX 52m (<1.64mm), OM1 nl, RCA 55p, patent stent, RPDA nl, EF 65%.  . Depression   . Diastolic dysfunction     a. echo 12/2013: EF 55-60%, mild LVH, GR1DD, inf HK, elevated CVP, mildly dilated IVC suggestive of increased RA pressure  .  Chronic chest pain   . Nicotine addiction     a. using eCigs.  . Heart attack ([place])   . Sleep apnea     PAST SURGICAL HISTORY:   Past Surgical History  Procedure Laterality Date  . Cardiac catheterization      MC x 1 stent  . Coronary angioplasty    . Left heart catheterization with coronary angiogram N/A 12/12/2013    Procedure: LEFT HEART CATHETERIZATION WITH CORONARY ANGIOGRAM;  Surgeon: Wellington Hampshire, MD;  Location: [facility];  Service: Cardiovascular;  Laterality: N/A;  . Cardiac catheterization Left 03/12/2015    Procedure: Left Heart Cath and Coronary Angiography;  Surgeon: Leonie Man, MD;  Location: [facility];  Service: Cardiovascular;  Laterality: Left;    SOCIAL HISTORY:   Social History  Substance Use Topics  . Smoking status: Former Smoker -- 3.00 packs/day for 48 years    Types: Cigarettes, E-cigarettes    Quit date: 06/07/2009  . Smokeless tobacco: Never Used     Comment: Using electronic cigarette  . Alcohol Use: 3.6 oz/week    6 Cans of beer per week    FAMILY HISTORY:   Family History  Problem Relation Age of Onset  . Coronary artery disease Brother   . Heart disease Brother 78  . Heart disease Father     MI x 8  . Kidney disease Neg Hx   .  Prostate cancer Neg Hx     DRUG ALLERGIES:   Allergies  Allergen Reactions  . Benzodiazepines Shortness Of Breath    Patient is on Xanax  . Paroxetine Hcl Shortness Of Breath and Palpitations  . Serotonin Reuptake Inhibitors (Ssris) Shortness Of Breath and Palpitations  . Diazepam Other (See Comments)    Rapid heartrate  . Doxycycline Monohydrate Other (See Comments)    Unknown allergic reaction  . Escitalopram Oxalate Other (See Comments)    serotonin Syndrome  . Isosorbide Nitrate Other (See Comments)    Feels like head was going to explode -- HA  . Lorazepam Other (See Comments)    Unknown allergic reaction  . Tetracyclines & Related Itching and Rash    MEDICATIONS AT HOME:    Prior to Admission medications   Medication Sig Start Date End Date Taking? Authorizing Provider  acetaminophen (TYLENOL) 500 MG tablet Take 1,000 mg by mouth daily as needed for headache.    Yes Historical Provider, MD  albuterol (PROVENTIL HFA;VENTOLIN HFA) 108 (90 Base) MCG/ACT inhaler Inhale 1-2 puffs into the lungs every 6 (six) hours as needed for wheezing or shortness of breath. 09/04/15  Yes Laverle Hobby, MD  albuterol (PROVENTIL) (2.5 MG/3ML) 0.083% nebulizer solution Take 3 mLs (2.5 mg total) by nebulization every 6 (six) hours as needed for wheezing or shortness of breath. 09/26/15  Yes Minna Merritts, MD  ALPRAZolam (XANAX XR) 3 MG 24 hr tablet Take 3 mg by mouth every evening.    Yes Historical Provider, MD  ALPRAZolam Duanne Moron) 1 MG tablet Take 1 mg by mouth daily.  11/21/13  Yes Historical Provider, MD  aspirin 81 MG EC tablet Take 1 tablet (81 mg total) by mouth daily. 09/04/15  Yes Minna Merritts, MD  atorvastatin (LIPITOR) 40 MG tablet Take 1 tablet (40 mg total) by mouth daily. 09/08/15  Yes Minna Merritts, MD  budesonide-formoterol (SYMBICORT) 160-4.5 MCG/ACT inhaler Inhale 2 puffs into the lungs 2 (two) times daily. 09/04/15  Yes Laverle Hobby, MD  ezetimibe (ZETIA) 10 MG tablet Take 1 tablet (10 mg total) by mouth daily. 06/05/15  Yes Minna Merritts, MD  hydrALAZINE (APRESOLINE) 25 MG tablet Take 1 tablet (25 mg total) by mouth 3 (three) times daily as needed. Patient taking differently: Take 25 mg by mouth 3 (three) times daily as needed (DIASTOLIC PRESSURE [id number]).  09/04/15  Yes Minna Merritts, MD  metoprolol succinate (TOPROL-XL) 25 MG 24 hr tablet Take 1 tablet (25 mg total) by mouth daily. 09/04/15  Yes Minna Merritts, MD  nitroGLYCERIN (NITROSTAT) 0.4 MG SL tablet Place 1 tablet (0.4 mg total) under the tongue every 5 (five) minutes as needed for chest pain. Call 911 after 3rd tablet 09/04/15  Yes Minna Merritts, MD  omeprazole (PRILOSEC) 40 MG capsule  Take 1 capsule (40 mg total) by mouth daily. 09/04/15  Yes Minna Merritts, MD  predniSONE (DELTASONE) 20 MG tablet Take 1 tablet (20 mg total) by mouth daily with breakfast. 09/26/15  Yes Minna Merritts, MD  roflumilast (DALIRESP) 500 MCG TABS tablet Take 1 tablet (500 mcg total) by mouth daily. 09/04/15  Yes Laverle Hobby, MD  ticagrelor (BRILINTA) 60 MG TABS tablet Take 1 tablet (60 mg total) by mouth 2 (two) times daily. 12/16/14  Yes Minna Merritts, MD  tiotropium (SPIRIVA) 18 MCG inhalation capsule Place 1 capsule (18 mcg total) into inhaler and inhale daily. 09/04/15  Yes Laverle Hobby, MD  RANEXA 500 MG  12 hr tablet TAKE ONE TABLET BY MOUTH TWICE DAILY Patient not taking: Reported on 09/27/2015 07/01/15   Minna Merritts, MD    REVIEW OF SYSTEMS:  Review of Systems  Constitutional: Negative for fever, chills, weight loss and malaise/fatigue.  HENT: Negative for ear pain, hearing loss and tinnitus.   Eyes: Negative for blurred vision, double vision, pain and redness.  Respiratory: Positive for shortness of breath. Negative for cough and hemoptysis.   Cardiovascular: Positive for chest pain. Negative for palpitations, orthopnea and leg swelling.  Gastrointestinal: Negative for nausea, vomiting, abdominal pain, diarrhea and constipation.  Genitourinary: Negative for dysuria, frequency and hematuria.  Musculoskeletal: Negative for back pain, joint pain and neck pain.  Skin:       No acne, rash, or lesions  Neurological: Negative for dizziness, tremors, focal weakness and weakness.  Endo/Heme/Allergies: Negative for polydipsia. Does not bruise/bleed easily.  Psychiatric/Behavioral: Negative for depression. The patient is not nervous/anxious and does not have insomnia.      VITAL SIGNS:   Filed Vitals:   09/27/15 2237 09/27/15 2248 09/27/15 2301  BP: 138/96 159/92 142/88  Pulse: 89 93 92  Resp: 18 18 18   SpO2: 100% 99% 97%   Wt Readings from Last 3 Encounters:   09/26/15 83.915 kg (185 lb)  07/11/15 84.959 kg (187 lb 4.8 oz)  07/04/15 84.052 kg (185 lb 4.8 oz)    PHYSICAL EXAMINATION:  Physical Exam  Vitals reviewed. Constitutional: He is oriented to person, place, and time. He appears well-developed and well-nourished. No distress.  HENT:  Head: Normocephalic and atraumatic.  Mouth/Throat: Oropharynx is clear and moist.  Eyes: Conjunctivae and EOM are normal. Pupils are equal, round, and reactive to light. No scleral icterus.  Neck: Normal range of motion. Neck supple. No JVD present. No thyromegaly present.  Cardiovascular: Normal rate, regular rhythm and intact distal pulses.  Exam reveals no gallop and no friction rub.   No murmur heard. Respiratory: Effort normal and breath sounds normal. No respiratory distress. He has no wheezes. He has no rales.  GI: Soft. Bowel sounds are normal. He exhibits no distension. There is no tenderness.  Musculoskeletal: Normal range of motion. He exhibits no edema.  No arthritis, no gout  Lymphadenopathy:    He has no cervical adenopathy.  Neurological: He is alert and oriented to person, place, and time. No cranial nerve deficit.  No dysarthria, no aphasia  Skin: Skin is warm and dry. No rash noted. No erythema.  Psychiatric: He has a normal mood and affect. His behavior is normal. Judgment and thought content normal.    LABORATORY PANEL:   CBC  Recent Labs Lab 09/27/15 2019  WBC 7.9  HGB 13.5  HCT 39.4*  PLT 261   ------------------------------------------------------------------------------------------------------------------  Chemistries   Recent Labs Lab 09/27/15 2019  NA 136  K 4.0  CL 103  CO2 23  GLUCOSE 154*  BUN 11  CREATININE 1.64*  CALCIUM 9.2   ------------------------------------------------------------------------------------------------------------------  Cardiac Enzymes  Recent Labs Lab 09/27/15 2019  TROPONINI <0.03    ------------------------------------------------------------------------------------------------------------------  RADIOLOGY:  Dg Chest 2 View  09/27/2015  CLINICAL DATA:  Awoke with chest pain yesterday with resolution, onset of recurrent left-sided chest pain this evening. EXAM: CHEST  2 VIEW COMPARISON:  03/01/2015 FINDINGS: Hyperinflation is unchanged. The cardiomediastinal contours are normal. Coronary stent is seen. Pulmonary vasculature is normal. No consolidation, pleural effusion, or pneumothorax. No acute osseous abnormalities are seen. IMPRESSION: Unchanged hyperinflation.  No localizing process. Electronically Signed  By: Jeb Levering M.D.   On: 09/27/2015 20:37    EKG:   Orders placed or performed during the hospital encounter of 09/27/15  . EKG 12-Lead  . EKG 12-Lead  . ED EKG within 10 minutes  . ED EKG within 10 minutes    IMPRESSION AND PLAN:  Principal Problem:   Unstable angina ([place]) - initial cardiac enzyme negative. EKG without acute ischemic changes per my read. Patient is currently chest pain-free. We'll admit him to telemetry, trend his cardiac enzymes tonight, get an echocardiogram and a cardiology consult in the morning. Active Problems:   CAD S/P PCI DES to RCA - continue home meds for this, further workup as above.   Essential hypertension - currently stable, continue home meds   COPD (chronic obstructive pulmonary disease) ([place]) - continue home dose inhalers   Atrial tachycardia, paroxysmal ([facility]) - currently sinus rhythm, continue home rate controlling medications.   Hyperlipidemia - continue home meds   CKD (chronic kidney disease) stage 3, GFR 30-59 ml/min - stable at baseline, avoid nephrotoxins and monitor   GERD (gastroesophageal reflux disease) - home dose PPI  All the records are reviewed and case discussed with ED provider. Management plans discussed with the patient and/or family.  DVT PROPHYLAXIS: SubQ lovenox  GI PROPHYLAXIS:  PPI  ADMISSION STATUS: Observation  CODE STATUS:  Code Status History    Date Active Date Inactive Code Status Order ID Comments User Context   03/12/2015  9:16 AM 03/12/2015  2:41 PM Full Code [id number]  Leonie Man, MD Inpatient   03/01/2015  3:10 PM 03/02/2015  1:59 PM Full Code [id number]  Aldean Jewett, MD Inpatient   11/23/2014 12:24 AM 11/23/2014  2:13 PM Full Code [id number]  Lavina Hamman, MD ED   06/08/2014  8:50 PM 06/09/2014  6:13 PM Full Code [id number]  Ivor Costa, MD Inpatient   12/12/2013  1:24 PM 12/14/2013  5:02 PM Full Code [id number]  Wellington Hampshire, MD Inpatient   12/11/2013  3:08 PM 12/12/2013  1:24 PM Full Code [id number]  Drucilla Schmidt, MD Inpatient    Prior  TOTAL TIME TAKING CARE OF THIS PATIENT: 40 minutes.    Shaden Higley [place] 09/28/2015, 12:31 AM  [facility]  Office  [phone]  CC: Primary care physician; Leonides Sake, MD

## 2015-09-28 NOTE — Progress Notes (Signed)
PT's wife brought his 60 mg Brilinta d/t [facility] does not stock this dose.  Pharmacy checked medication and returned to unit, medicine in patient's bin and being administered to him, please return at discharge

## 2015-09-28 NOTE — Care Management Obs Status (Signed)
[facility] NOTIFICATION   Patient Details  Name: William Mueller MRN: [id number] Date of Birth: September 24, 1950   Medicare Observation Status Notification Given:  Yes    CrutchfieldAntony Haste, RN 09/28/2015, 8:06 PM

## 2015-09-28 NOTE — Progress Notes (Signed)
No cath or stress test today, ok to feed patient per Dr Stanford Breed

## 2015-09-28 NOTE — Progress Notes (Signed)
Pt. Arrived to unit via stretcher. Pt. Transferred from stretcher to bed unassisted by staff. Tele applied, NSR, Tele box 4 verified with [place], CNA.  Pt. A&O, with no acute distress noted. No SOB observed. Skin assessed with Adrianne, RN, bruising to BUE, no other skin issues noted. General room orientation given, instruction on how to use call bell and ascom. NS started at 36ml/hr. Pt. NPO at this time. Will continue to monitor pt.

## 2015-09-29 ENCOUNTER — Encounter: Payer: Self-pay | Admitting: Radiology

## 2015-09-29 ENCOUNTER — Encounter
Admission: EM | Disposition: A | Discharge status: Home / Self Care | Payer: Self-pay | Source: Home / Self Care | Attending: Emergency Medicine

## 2015-09-29 ENCOUNTER — Telehealth: Payer: Self-pay | Admitting: Cardiovascular Disease

## 2015-09-29 ENCOUNTER — Observation Stay (HOSPITAL_BASED_OUTPATIENT_CLINIC_OR_DEPARTMENT_OTHER): Payer: Medicare Other

## 2015-09-29 DIAGNOSIS — R079 Chest pain, unspecified: Secondary | ICD-10-CM | POA: Diagnosis not present

## 2015-09-29 DIAGNOSIS — I200 Unstable angina: Secondary | ICD-10-CM | POA: Diagnosis not present

## 2015-09-29 DIAGNOSIS — I10 Essential (primary) hypertension: Secondary | ICD-10-CM | POA: Diagnosis not present

## 2015-09-29 DIAGNOSIS — N183 Chronic kidney disease, stage 3 (moderate): Secondary | ICD-10-CM | POA: Diagnosis not present

## 2015-09-29 DIAGNOSIS — I2510 Atherosclerotic heart disease of native coronary artery without angina pectoris: Secondary | ICD-10-CM | POA: Diagnosis not present

## 2015-09-29 DIAGNOSIS — I25110 Atherosclerotic heart disease of native coronary artery with unstable angina pectoris: Secondary | ICD-10-CM

## 2015-09-29 DIAGNOSIS — E785 Hyperlipidemia, unspecified: Secondary | ICD-10-CM | POA: Diagnosis not present

## 2015-09-29 HISTORY — PX: CARDIAC CATHETERIZATION: SHX172

## 2015-09-29 SURGERY — LEFT HEART CATH AND CORONARY ANGIOGRAPHY
Anesthesia: Moderate Sedation

## 2015-09-29 MED ORDER — TECHNETIUM TC 99M SESTAMIBI - CARDIOLITE
12.5630 | Freq: Once | INTRAVENOUS | Status: AC | PRN
  Administered 2015-09-29: 08:00:00 12.563 via INTRAVENOUS

## 2015-09-29 MED ORDER — PREDNISONE 10 MG PO TABS
10.0000 mg | ORAL_TABLET | Freq: Every day | ORAL | Status: DC
  Administered 2015-09-29: 10 mg via ORAL
  Filled 2015-09-29: qty 1

## 2015-09-29 MED ORDER — MIDAZOLAM HCL 2 MG/2ML IJ SOLN
INTRAMUSCULAR | Status: AC
  Filled 2015-09-29: qty 2

## 2015-09-29 MED ORDER — SODIUM CHLORIDE 0.9 % IV SOLN: 250.0000 mL | INTRAVENOUS | Status: DC | PRN

## 2015-09-29 MED ORDER — SODIUM CHLORIDE 0.9 % IV SOLN
INTRAVENOUS | Status: DC
  Administered 2015-09-29: 14:00:00 via INTRAVENOUS

## 2015-09-29 MED ORDER — SODIUM CHLORIDE 0.9% FLUSH: 3.0000 mL | INTRAVENOUS | Status: DC | PRN

## 2015-09-29 MED ORDER — ATORVASTATIN CALCIUM 80 MG PO TABS: 80.0000 mg | ORAL_TABLET | Freq: Every day | ORAL | Status: DC

## 2015-09-29 MED ORDER — VERAPAMIL HCL 2.5 MG/ML IV SOLN
INTRAVENOUS | Status: AC
  Filled 2015-09-29: qty 2

## 2015-09-29 MED ORDER — ADENOSINE (DIAGNOSTIC) 3 MG/ML IV SOLN
INTRAVENOUS | Status: AC
  Filled 2015-09-29: qty 30

## 2015-09-29 MED ORDER — VERAPAMIL HCL 2.5 MG/ML IV SOLN
INTRAVENOUS | Status: DC | PRN
  Administered 2015-09-29: 2.5 mg via INTRA_ARTERIAL

## 2015-09-29 MED ORDER — HEPARIN (PORCINE) IN NACL 2-0.9 UNIT/ML-% IJ SOLN
INTRAMUSCULAR | Status: AC
  Filled 2015-09-29: qty 500

## 2015-09-29 MED ORDER — PREDNISONE 10 MG PO TABS: 10.0000 mg | ORAL_TABLET | Freq: Every day | ORAL | Status: DC

## 2015-09-29 MED ORDER — AMLODIPINE BESYLATE 5 MG PO TABS: 5.0000 mg | ORAL_TABLET | Freq: Every day | ORAL | Status: DC

## 2015-09-29 MED ORDER — NITROGLYCERIN 5 MG/ML IV SOLN
INTRAVENOUS | Status: AC
  Filled 2015-09-29: qty 10

## 2015-09-29 MED ORDER — HEPARIN SODIUM (PORCINE) 1000 UNIT/ML IJ SOLN
INTRAMUSCULAR | Status: AC
  Filled 2015-09-29: qty 1

## 2015-09-29 MED ORDER — HEPARIN SODIUM (PORCINE) 1000 UNIT/ML IJ SOLN
INTRAMUSCULAR | Status: DC | PRN
  Administered 2015-09-29: 2000 [IU] via INTRAVENOUS
  Administered 2015-09-29: 4000 [IU] via INTRAVENOUS

## 2015-09-29 MED ORDER — ADENOSINE (DIAGNOSTIC) 140MCG/KG/MIN
INTRAVENOUS | Status: DC | PRN
  Administered 2015-09-29: 140 ug/kg/min via INTRAVENOUS

## 2015-09-29 MED ORDER — FENTANYL CITRATE (PF) 100 MCG/2ML IJ SOLN
INTRAMUSCULAR | Status: AC
  Filled 2015-09-29: qty 2

## 2015-09-29 MED ORDER — IOPAMIDOL (ISOVUE-300) INJECTION 61%
INTRAVENOUS | Status: DC | PRN
Start: 2015-09-29 — End: 2015-09-29
  Administered 2015-09-29: 45 mL via INTRA_ARTERIAL

## 2015-09-29 MED ORDER — FENTANYL CITRATE (PF) 100 MCG/2ML IJ SOLN
INTRAMUSCULAR | Status: DC | PRN
  Administered 2015-09-29: 50 ug via INTRAVENOUS

## 2015-09-29 MED ORDER — SODIUM CHLORIDE 0.9% FLUSH: 3.0000 mL | Freq: Two times a day (BID) | INTRAVENOUS | Status: DC

## 2015-09-29 SURGICAL SUPPLY — 9 items
CATH OPTITORQUE JACKY 4.0 5F (CATHETERS) ×3 IMPLANT
CATH VISTA GUIDE 6FR JR4 (CATHETERS) ×2 IMPLANT
DEVICE RAD TR BAND REGULAR (VASCULAR PRODUCTS) ×2 IMPLANT
GLIDESHEATH SLEND SS 6F .021 (SHEATH) ×3 IMPLANT
KIT MANI 3VAL PERCEP (MISCELLANEOUS) ×3 IMPLANT
PACK CARDIAC CATH (CUSTOM PROCEDURE TRAY) ×3 IMPLANT
VALVE COPILOT STAT (MISCELLANEOUS) ×2 IMPLANT
WIRE PRESSURE VERRATA (WIRE) ×2 IMPLANT
WIRE SAFE-T 1.5MM-J .035X260CM (WIRE) ×3 IMPLANT

## 2015-09-29 NOTE — Telephone Encounter (Signed)
TCM ph [facility] for chest pain s/p cath Dr. Fletcher Anon .   Needs 1 week fu   Scheduled with Carolynn Serve 10-09-15 at 11:30

## 2015-09-29 NOTE — Discharge Summary (Signed)
[facility] NAME: William Mueller    MR#:  [id number]  DATE OF BIRTH:  02-Dec-1950  DATE OF ADMISSION:  09/27/2015 ADMITTING PHYSICIAN: Lance Coon, MD  DATE OF DISCHARGE: 09/29/2015  PRIMARY CARE PHYSICIAN: Leonides Sake, MD    ADMISSION DIAGNOSIS:  Chest pain, unspecified chest pain type [R07.9]  DISCHARGE DIAGNOSIS:  Principal Problem:   Unstable angina (HCC) Active Problems:   Essential hypertension   Hyperlipidemia   COPD (chronic obstructive pulmonary disease) (HCC)   Atrial tachycardia, paroxysmal (HCC)   CKD (chronic kidney disease) stage 3, GFR 30-59 ml/min   GERD (gastroesophageal reflux disease)   CAD S/P PCI DES to RCA   SECONDARY DIAGNOSIS:   Past Medical History  Diagnosis Date  . Hypertension   . Anxiety   . COPD (chronic obstructive pulmonary disease) ([place])   . High cholesterol   . GERD (gastroesophageal reflux disease)   . Daily headache   . Chronic kidney disease (CKD), stage III (moderate)   . Coronary artery disease     a. 12/2013 PCI: mRCA 100% with L to R collats s/p PCI/DES;  b. 06/2014 MV: no ischemia/infarct, EF 53%;  c. 03/2015 Cath: LM nl, LAD nl, D1 80 (1.61mm), LCX 1m (<1.62mm), OM1 nl, RCA 55p, patent stent, RPDA nl, EF 65%.  . Depression   . Diastolic dysfunction     a. echo 12/2013: EF 55-60%, mild LVH, GR1DD, inf HK, elevated CVP, mildly dilated IVC suggestive of increased RA pressure  . Chronic chest pain   . Nicotine addiction     a. using e[employer].  . Sleep apnea     HOSPITAL COURSE:  William Mueller  is a 65 y.o. male admitted 09/27/2015 with chief complaint Chest Pain . Please see H&P performed by Lance Coon, MD for further information. Patient presented with the above symptoms. Evaluated by cardiology during hospital stay - underwent cardiac catheterization.  Mid Cx to Dist Cx lesion, 90% stenosed.  1st Diag lesion, 80% stenosed.  Prox RCA lesion, 55% stenosed. The lesion was not  previously treated.  Prox LAD to Mid LAD lesion, 40% stenosed. Widely patent mid RCA stent. Stable proximal moderate RCA stenosis not significant by pressure wire interrogation. Significant disease affecting a small first diagonal and the AV groove branch of the left circumflex. Both of these are unchanged from before. There is mild to moderate calcified proximal to mid LAD disease which is also unchanged.  Given no evidence of progressive disease change, medical management was chosen.   DISCHARGE CONDITIONS:   stable  CONSULTS OBTAINED:  Treatment Team:  Lelon Perla, MD  DRUG ALLERGIES:   Allergies  Allergen Reactions  . Benzodiazepines Shortness Of Breath    Patient is on Xanax  . Paroxetine Hcl Shortness Of Breath and Palpitations  . Serotonin Reuptake Inhibitors (Ssris) Shortness Of Breath and Palpitations  . Diazepam Other (See Comments)    Rapid heartrate  . Doxycycline Monohydrate Other (See Comments)    Unknown allergic reaction  . Escitalopram Oxalate Other (See Comments)    serotonin Syndrome  . Isosorbide Nitrate Other (See Comments)    Feels like head was going to explode -- HA  . Lorazepam Other (See Comments)    Unknown allergic reaction  . Tetracyclines & Related Itching and Rash    DISCHARGE MEDICATIONS:   Current Discharge Medication List    START taking these medications   Details  amLODipine (NORVASC) 5 MG tablet Take  1 tablet (5 mg total) by mouth daily. Qty: 30 tablet, Refills: 0      CONTINUE these medications which have CHANGED   Details  atorvastatin (LIPITOR) 80 MG tablet Take 1 tablet (80 mg total) by mouth daily. Qty: 30 tablet, Refills: 0    predniSONE (DELTASONE) 10 MG tablet Take 1 tablet (10 mg total) by mouth daily with breakfast. Qty: 2 tablet, Refills: 0      CONTINUE these medications which have NOT CHANGED   Details  acetaminophen (TYLENOL) 500 MG tablet Take 1,000 mg by mouth daily as needed for headache.     albuterol  (PROVENTIL HFA;VENTOLIN HFA) 108 (90 Base) MCG/ACT inhaler Inhale 1-2 puffs into the lungs every 6 (six) hours as needed for wheezing or shortness of breath. Qty: 3 Inhaler, Refills: 1    albuterol (PROVENTIL) (2.5 MG/3ML) 0.083% nebulizer solution Take 3 mLs (2.5 mg total) by nebulization every 6 (six) hours as needed for wheezing or shortness of breath. Qty: 75 mL, Refills: 12    ALPRAZolam (XANAX XR) 3 MG 24 hr tablet Take 3 mg by mouth every evening.     ALPRAZolam (XANAX) 1 MG tablet Take 1 mg by mouth daily.     aspirin 81 MG EC tablet Take 1 tablet (81 mg total) by mouth daily. Qty: 90 tablet, Refills: 3    budesonide-formoterol (SYMBICORT) 160-4.5 MCG/ACT inhaler Inhale 2 puffs into the lungs 2 (two) times daily. Qty: 3 Inhaler, Refills: 1    ezetimibe (ZETIA) 10 MG tablet Take 1 tablet (10 mg total) by mouth daily. Qty: 90 tablet, Refills: 3    hydrALAZINE (APRESOLINE) 25 MG tablet Take 1 tablet (25 mg total) by mouth 3 (three) times daily as needed. Qty: 270 tablet, Refills: 3    metoprolol succinate (TOPROL-XL) 25 MG 24 hr tablet Take 1 tablet (25 mg total) by mouth daily. Qty: 90 tablet, Refills: 3    nitroGLYCERIN (NITROSTAT) 0.4 MG SL tablet Place 1 tablet (0.4 mg total) under the tongue every 5 (five) minutes as needed for chest pain. Call 911 after 3rd tablet Qty: 25 tablet, Refills: 3    omeprazole (PRILOSEC) 40 MG capsule Take 1 capsule (40 mg total) by mouth daily. Qty: 90 capsule, Refills: 3    roflumilast (DALIRESP) 500 MCG TABS tablet Take 1 tablet (500 mcg total) by mouth daily. Qty: 90 tablet, Refills: 1    ticagrelor (BRILINTA) 60 MG TABS tablet Take 1 tablet (60 mg total) by mouth 2 (two) times daily. Qty: 60 tablet, Refills: 11    tiotropium (SPIRIVA) 18 MCG inhalation capsule Place 1 capsule (18 mcg total) into inhaler and inhale daily. Qty: 90 capsule, Refills: 1    RANEXA 500 MG 12 hr tablet TAKE ONE TABLET BY MOUTH TWICE DAILY Qty: 60 tablet,  Refills: 3         DISCHARGE INSTRUCTIONS:    DIET:  Cardiac diet  DISCHARGE CONDITION:  Stable  ACTIVITY:  Activity as tolerated  OXYGEN:  Home Oxygen: No.   Oxygen Delivery: room air  DISCHARGE LOCATION:  home   If you experience worsening of your admission symptoms, develop shortness of breath, life threatening emergency, suicidal or homicidal thoughts you must seek medical attention immediately by calling 911 or calling your MD immediately  if symptoms less severe.  You Must read complete instructions/literature along with all the possible adverse reactions/side effects for all the Medicines you take and that have been prescribed to you. Take any new Medicines after you  have completely understood and accpet all the possible adverse reactions/side effects.   Please note  You were cared for by a hospitalist during your hospital stay. If you have any questions about your discharge medications or the care you received while you were in the hospital after you are discharged, you can call the unit and asked to speak with the hospitalist on call if the hospitalist that took care of you is not available. Once you are discharged, your primary care physician will handle any further medical issues. Please note that NO REFILLS for any discharge medications will be authorized once you are discharged, as it is imperative that you return to your primary care physician (or establish a relationship with a primary care physician if you do not have one) for your aftercare needs so that they can reassess your need for medications and monitor your lab values.    On the day of Discharge:   VITAL SIGNS:  Blood pressure 132/87, pulse 75, temperature 97.9 F (36.6 C), temperature source Oral, resp. rate 15, height 5\' 9"  (1.753 m), weight 83.462 kg (184 lb), SpO2 100 %.  I/O:   Intake/Output Summary (Last 24 hours) at 09/29/15 1121 Last data filed at 09/29/15 0448  Gross per 24 hour  Intake     360 ml  Output   1050 ml  Net   -690 ml    PHYSICAL EXAMINATION:  GENERAL:  65 y.o.-year-old patient lying in the bed with no acute distress.  EYES: Pupils equal, round, reactive to light and accommodation. No scleral icterus. Extraocular muscles intact.  HEENT: Head atraumatic, normocephalic. Oropharynx and nasopharynx clear.  NECK:  Supple, no jugular venous distention. No thyroid enlargement, no tenderness.  LUNGS: Normal breath sounds bilaterally, no wheezing, rales,rhonchi or crepitation. No use of accessory muscles of respiration.  CARDIOVASCULAR: S1, S2 normal. No murmurs, rubs, or gallops.  ABDOMEN: Soft, non-tender, non-distended. Bowel sounds present. No organomegaly or mass.  EXTREMITIES: No pedal edema, cyanosis, or clubbing.  NEUROLOGIC: Cranial nerves II through XII are intact. Muscle strength 5/5 in all extremities. Sensation intact. Gait not checked.  PSYCHIATRIC: The patient is alert and oriented x 3.  SKIN: No obvious rash, lesion, or ulcer.   DATA REVIEW:   CBC  Recent Labs Lab 09/28/15 0152  WBC 10.8*  HGB 13.0  HCT 38.5*  PLT 236    Chemistries   Recent Labs Lab 09/28/15 0152  NA 139  K 3.8  CL 105  CO2 26  GLUCOSE 112*  BUN 11  CREATININE 1.61*  CALCIUM 9.0    Cardiac Enzymes  Recent Labs Lab 09/28/15 1321  Jay <0.03    Microbiology Results  No results found for this or any previous visit.  RADIOLOGY:  Dg Chest 2 View  09/27/2015  CLINICAL DATA:  Awoke with chest pain yesterday with resolution, onset of recurrent left-sided chest pain this evening. EXAM: CHEST  2 VIEW COMPARISON:  03/01/2015 FINDINGS: Hyperinflation is unchanged. The cardiomediastinal contours are normal. Coronary stent is seen. Pulmonary vasculature is normal. No consolidation, pleural effusion, or pneumothorax. No acute osseous abnormalities are seen. IMPRESSION: Unchanged hyperinflation.  No localizing process. Electronically Signed   By: Jeb Levering  M.D.   On: 09/27/2015 20:37     Management plans discussed with the patient, family and they are in agreement.  CODE STATUS:     Code Status Orders        Start     Ordered   09/28/15 0120  Full  code   Continuous     09/28/15 0119    Code Status History    Date Active Date Inactive Code Status Order ID Comments User Context   03/12/2015  9:16 AM 03/12/2015  2:41 PM Full Code [id number]  Leonie Man, MD Inpatient   03/01/2015  3:10 PM 03/02/2015  1:59 PM Full Code [id number]  Aldean Jewett, MD Inpatient   11/23/2014 12:24 AM 11/23/2014  2:13 PM Full Code [id number]  Lavina Hamman, MD ED   06/08/2014  8:50 PM 06/09/2014  6:13 PM Full Code [id number]  Ivor Costa, MD Inpatient   12/12/2013  1:24 PM 12/14/2013  5:02 PM Full Code [id number]  Wellington Hampshire, MD Inpatient   12/11/2013  3:08 PM 12/12/2013  1:24 PM Full Code [id number]  Drucilla Schmidt, MD Inpatient      TOTAL TIME TAKING CARE OF THIS PATIENT: 28 minutes.    Izzak Fries,  Karenann Cai.D on 09/29/2015 at 11:21 AM  Between 7am to 6pm - Pager - [phone]  After 6pm go to [email or web address] - Proofreader  [facility]  Office  [phone]  CC: Primary care physician; Leonides Sake, MD

## 2015-09-29 NOTE — Plan of Care (Signed)
Problem: Phase II Progression Outcomes Goal: Ambulates up to 600 ft. in hall x 1 Outcome: Completed/Met Date Met:  09/29/15 Patient ambulated around nurses station x3 (approx. 650 feet) and tolerated well. No complaints of pain, shortness of breath and dizziness.

## 2015-09-29 NOTE — Progress Notes (Signed)
Report to [facility] telemetry.  Keep right wrist elevated on pillow above the heart for today.  Watch right wrist for evidence of bleeding or hematoma.. If bleeding or hematoma noted, hold pressure over the site for at least 15 minutes and notify the physician.  No blending or flexing of the wrist--no lifting for the remainder of the day. Notify the physician for evidence of infection or temperature.

## 2015-09-29 NOTE — Telephone Encounter (Signed)
Attempted to contact pt regarding discharge from [facility] on 09/29/15. Left message asking pt to call back regarding discharge instructions and/or medications. Advised pt of appt w/ Ignacia Bayley, NP on 10/09/15 at 11:30 w/ [facility]. Asked pt to call back if unable to keep this appt.

## 2015-09-29 NOTE — Progress Notes (Signed)
Pt. Discharged to home via wc. Discharge instructions and medication regimen reviewed at bedside with patient and spouse. Both verbalize understanding of instructions and medication regimen. Prescriptions sent to med management, family aware. Patient assessment unchanged from this morning. TELE and IV discontinued per policy.

## 2015-09-29 NOTE — Telephone Encounter (Signed)
Daliresp was denied. Called to initiate an appeals which can take up to 72hrs. Ref # [id number]. Will await response for appeal.

## 2015-09-29 NOTE — Progress Notes (Addendum)
Patient returned to room from cath lab. R radial site shows no evidence of hematoma or bleeding; no complaints of pain, VSS. IVF to continue for 6 hrs post cath prior to discharge, cath ended around approx. 1030, fluids to continue until 4:30pm.   Patient currently declining taking AM medications except prednisone, states he will take his evening medications tonight when he gets home and will start his daily medications tomorrow 4/25 at 7am, as he reports he normally does.

## 2015-09-29 NOTE — Telephone Encounter (Signed)
err

## 2015-09-29 NOTE — Progress Notes (Signed)
SUBJECTIVE:  No recurrent chest pain. He had prolonged episode of substernal chest tightness and Saturday that required nitroglycerin. He reports worsening exertional dyspnea with frequent chest pain.   Filed Vitals:   09/28/15 0956 09/28/15 1120 09/28/15 2028 09/29/15 0448  BP:  128/71 133/76 130/77  Pulse: 123 104 84 85  Temp:  98.7 F (37.1 C) 98.1 F (36.7 C) 97.9 F (36.6 C)  TempSrc:  Oral Oral Oral  Resp:  22 18 18   Height:      Weight:      SpO2:  97% 98% 98%    Intake/Output Summary (Last 24 hours) at 09/29/15 0852 Last data filed at 09/29/15 0448  Gross per 24 hour  Intake    360 ml  Output   1050 ml  Net   -690 ml    LABS: Basic Metabolic Panel:  Recent Labs  09/27/15 2019 09/28/15 0152  NA 136 139  K 4.0 3.8  CL 103 105  CO2 23 26  GLUCOSE 154* 112*  BUN 11 11  CREATININE 1.64* 1.61*  CALCIUM 9.2 9.0   Liver Function Tests: No results for input(s): AST, ALT, ALKPHOS, BILITOT, PROT, ALBUMIN in the last 72 hours. No results for input(s): LIPASE, AMYLASE in the last 72 hours. CBC:  Recent Labs  09/27/15 2019 09/28/15 0152  WBC 7.9 10.8*  HGB 13.5 13.0  HCT 39.4* 38.5*  MCV 86.8 86.6  PLT 261 236   Cardiac Enzymes:  Recent Labs  09/28/15 0152 09/28/15 0705 09/28/15 1321  TROPONINI <0.03 <0.03 <0.03   BNP: Invalid input(s): POCBNP D-Dimer: No results for input(s): DDIMER in the last 72 hours. Hemoglobin A1C: No results for input(s): HGBA1C in the last 72 hours. Fasting Lipid Panel: No results for input(s): CHOL, HDL, LDLCALC, TRIG, CHOLHDL, LDLDIRECT in the last 72 hours. Thyroid Function Tests: No results for input(s): TSH, T4TOTAL, T3FREE, THYROIDAB in the last 72 hours.  Invalid input(s): FREET3 Anemia Panel: No results for input(s): VITAMINB12, FOLATE, FERRITIN, TIBC, IRON, RETICCTPCT in the last 72 hours.   PHYSICAL EXAM General: Well developed, well nourished, in no acute distress HEENT:  Normocephalic and  atramatic Neck:  No JVD.  Lungs: Clear bilaterally to auscultation and percussion. Heart: HRRR . Normal S1 and S2 without gallops or murmurs.  Abdomen: Bowel sounds are positive, abdomen soft and non-tender  Msk:  Back normal, normal gait. Normal strength and tone for age. Extremities: No clubbing, cyanosis or edema.   Neuro: Alert and oriented X 3. Psych:  Good affect, responds appropriately  TELEMETRY: Reviewed telemetry pt in  Normal sinus rhythm:  ASSESSMENT AND PLAN:  1. Possible unstable angina with prolonged chest pain at rest with some atypical features. I reviewed his most recent cardiac catheterization which showed significant disease in a small diagonal and the AV groove branch of the left circumflex. Both of these were small in diameter around 1.5 mm. However, there was also 60% proximal right coronary artery stenosis before the previously placed stent. There is a possibility of progression in that area given that the stenosis was eccentric and complex overall. I had a prolonged discussion with the patient about the options while he was getting ready to have a stress test done. The patient absolutely does not want to have [place] . It's not certain that he would be able to achieve 85% maximal predicted heart rate especially that he is on Toprol. I gave him the option of cardiac catheterization with limited contrast use with possible pressure  wire interrogation of the proximal right coronary artery. After presenting different options, the patient prefers to go through cardiac catheterization for a definitive answer. I discussed the risks and benefits with him. Continue current medications. Further recommendations to follow after cardiac catheterization.  2. Hyperlipidemia: Continue treatment with atorvastatin.  3. Chronic kidney disease: Start hydration for cardiac catheterization.  William Sacramento, MD, [facility] 09/29/2015 8:52 AM

## 2015-09-30 ENCOUNTER — Telehealth: Payer: Self-pay | Admitting: Cardiovascular Disease

## 2015-09-30 ENCOUNTER — Telehealth: Payer: Self-pay | Admitting: Internal Medicine

## 2015-09-30 NOTE — Telephone Encounter (Signed)
Patient wife has questions about hospital dc medications.  Please call .

## 2015-09-30 NOTE — Telephone Encounter (Signed)
Please see previous phone note.  

## 2015-09-30 NOTE — Telephone Encounter (Addendum)
Spoke w/ pt's wife.  Clarified that pt is to take atorvastatin in the evening and amlodipine in the am.  Advised her of appt w/ Gerald Stabs on 10/09/15. She is appreciative and will call back w/ any further questions or concerns.

## 2015-09-30 NOTE — Telephone Encounter (Signed)
LM on VM for them to call me back.

## 2015-09-30 NOTE — Telephone Encounter (Signed)
[facility] health care appeals department Just needs to clarify some things that were sent to them on [place]  [place] Please call back.

## 2015-09-30 NOTE — Telephone Encounter (Signed)
Spoke with Renato Shin at [facility] and answered her questions. States we should have a response within 24-72 hours.

## 2015-09-30 NOTE — Telephone Encounter (Signed)
Spoke with Renato Shin at [facility] and answered her questions. States we should have a response within 24-72 hours.

## 2015-10-06 DIAGNOSIS — R079 Chest pain, unspecified: Secondary | ICD-10-CM | POA: Diagnosis not present

## 2015-10-06 DIAGNOSIS — I201 Angina pectoris with documented spasm: Secondary | ICD-10-CM | POA: Diagnosis not present

## 2015-10-06 DIAGNOSIS — J449 Chronic obstructive pulmonary disease, unspecified: Secondary | ICD-10-CM | POA: Diagnosis not present

## 2015-10-06 DIAGNOSIS — Z79899 Other long term (current) drug therapy: Secondary | ICD-10-CM | POA: Diagnosis not present

## 2015-10-09 ENCOUNTER — Ambulatory Visit (INDEPENDENT_AMBULATORY_CARE_PROVIDER_SITE_OTHER): Payer: Medicare Other | Admitting: Nurse Practitioner

## 2015-10-09 ENCOUNTER — Encounter: Payer: Self-pay | Admitting: Nurse Practitioner

## 2015-10-09 VITALS — BP 122/84 | HR 84 | Ht 69.0 in | Wt 188.8 lb

## 2015-10-09 DIAGNOSIS — Z720 Tobacco use: Secondary | ICD-10-CM

## 2015-10-09 DIAGNOSIS — I10 Essential (primary) hypertension: Secondary | ICD-10-CM | POA: Diagnosis not present

## 2015-10-09 DIAGNOSIS — I2510 Atherosclerotic heart disease of native coronary artery without angina pectoris: Secondary | ICD-10-CM | POA: Diagnosis not present

## 2015-10-09 DIAGNOSIS — F1910 Other psychoactive substance abuse, uncomplicated: Secondary | ICD-10-CM | POA: Diagnosis not present

## 2015-10-09 DIAGNOSIS — N183 Chronic kidney disease, stage 3 unspecified: Secondary | ICD-10-CM

## 2015-10-09 DIAGNOSIS — E785 Hyperlipidemia, unspecified: Secondary | ICD-10-CM

## 2015-10-09 DIAGNOSIS — I119 Hypertensive heart disease without heart failure: Secondary | ICD-10-CM

## 2015-10-09 NOTE — Telephone Encounter (Signed)
Daliresp is approved from 09/30/15-06/06/16. Ref# [id number].

## 2015-10-09 NOTE — Patient Instructions (Signed)
Medication Instructions:  Please continue your current medications  Labwork: None  Testing/Procedures: None  Follow-Up: Call or return to clinic prn if these symptoms worsen or fail to improve as anticipated.  If you need a refill on your cardiac medications before your next appointment, please call your pharmacy.

## 2015-10-09 NOTE — Progress Notes (Signed)
Office Visit    Patient Name: William Mueller Date of Encounter: 10/09/2015  Primary Care Provider:  Leonides Sake, MD Primary Cardiologist:  Johnny Bridge, MD   Chief Complaint    65 year old male with a history of CAD who presents for follow-up after recent catheterization.  Past Medical History    Past Medical History  Diagnosis Date  . Hypertension   . Anxiety   . COPD (chronic obstructive pulmonary disease) ([place])   . High cholesterol   . GERD (gastroesophageal reflux disease)   . Daily headache   . Chronic kidney disease (CKD), stage III (moderate)   . Coronary artery disease     a. 12/2013 PCI: mRCA 100% with L to R collats s/p PCI/DES;  b. 06/2014 MV: no ischemia/infarct, EF 53%;  c. 03/2015 Cath: LM nl, LAD nl, D1 80 (1.43mm), LCX 70m (<1.27mm), OM1 nl, RCA 55p, patent stent, RPDA nl, EF 65%; d. 09/2015 Cath: LM nl, LAD 86m, D1 80 (<84mm), LCX 62m/d (<1.60mm), OM1 nl, RCA 55p (FFR 0.93), patent stent, RPDA nl-->Med Rx.  . Depression   . Diastolic dysfunction     a. echo 12/2013: EF 55-60%, mild LVH, GR1DD, inf HK, elevated CVP, mildly dilated IVC suggestive of increased RA pressure  . Chronic chest pain   . Nicotine addiction     a. using eCigs.  . Sleep apnea    Past Surgical History  Procedure Laterality Date  . Cardiac catheterization      MC x 1 stent  . Coronary angioplasty    . Left heart catheterization with coronary angiogram N/A 12/12/2013    Procedure: LEFT HEART CATHETERIZATION WITH CORONARY ANGIOGRAM;  Surgeon: Wellington Hampshire, MD;  Location: [facility];  Service: Cardiovascular;  Laterality: N/A;  . Cardiac catheterization Left 03/12/2015    Procedure: Left Heart Cath and Coronary Angiography;  Surgeon: Leonie Man, MD;  Location: [facility];  Service: Cardiovascular;  Laterality: Left;  . Cardiac catheterization N/A 09/29/2015    Procedure: Left Heart Cath and Coronary Angiography;  Surgeon: Wellington Hampshire, MD;  Location: [facility];   Service: Cardiovascular;  Laterality: N/A;    Allergies  Allergies  Allergen Reactions  . Benzodiazepines Shortness Of Breath    Patient is on Xanax  . Paroxetine Hcl Shortness Of Breath and Palpitations  . Serotonin Reuptake Inhibitors (Ssris) Shortness Of Breath and Palpitations  . Diazepam Other (See Comments)    Rapid heartrate  . Doxycycline Monohydrate Other (See Comments)    Unknown allergic reaction  . Escitalopram Oxalate Other (See Comments)    serotonin Syndrome  . Isosorbide Nitrate Other (See Comments)    Feels like head was going to explode -- HA  . Lorazepam Other (See Comments)    Unknown allergic reaction  . Tetracyclines & Related Itching and Rash    History of Present Illness    17 rolled male with the above complex past history including coronary artery disease status post prior right coronary artery stenting with known small vessel circumflex and diagonal disease and chronic chest pain. Also has a history of hypertension, hyperlipidemia, tobacco abuse, stage III chronic kidney disease, and COPD. He was recently seen in clinic in April with complaints of chest discomfort. He underwent diagnostic catheterization revealing stable small vessel disease with patent right coronary artery stent. The circumflex and diagonal were felt to be too small for percutaneous intervention and medical therapy was recommended. He was placed on amlodipine and  says that since then, he has not had any recurrence of chest discomfort all. He was also given a prednisone taper however there was some confusion with the prescription and instead, he was just taking 20 mg daily. In that setting, he developed right ankle edema, which he isn't sure as to whether or not it is coming from the prednisone or the amlodipine. His primary care provider told him to stop the prednisone earlier this week. He is not bothered by the swelling as it is only mild. He denies dyspnea, PND, orthopnea, dizziness, syncope,  or early satiety.  Home Medications    Prior to Admission medications   Medication Sig Start Date End Date Taking? Authorizing Provider  acetaminophen (TYLENOL) 500 MG tablet Take 1,000 mg by mouth daily as needed for headache.    Yes Historical Provider, MD  albuterol (PROVENTIL HFA;VENTOLIN HFA) 108 (90 Base) MCG/ACT inhaler Inhale 1-2 puffs into the lungs every 6 (six) hours as needed for wheezing or shortness of breath. 09/04/15  Yes Laverle Hobby, MD  albuterol (PROVENTIL) (2.5 MG/3ML) 0.083% nebulizer solution Take 3 mLs (2.5 mg total) by nebulization every 6 (six) hours as needed for wheezing or shortness of breath. 09/26/15  Yes Minna Merritts, MD  ALPRAZolam (XANAX XR) 3 MG 24 hr tablet Take 3 mg by mouth every evening.    Yes Historical Provider, MD  ALPRAZolam Duanne Moron) 1 MG tablet Take 1 mg by mouth daily.  11/21/13  Yes Historical Provider, MD  amLODipine (NORVASC) 5 MG tablet Take 1 tablet (5 mg total) by mouth daily. 09/29/15  Yes Lytle Butte, MD  aspirin 81 MG EC tablet Take 1 tablet (81 mg total) by mouth daily. 09/04/15  Yes Minna Merritts, MD  atorvastatin (LIPITOR) 80 MG tablet Take 1 tablet (80 mg total) by mouth daily. 09/29/15  Yes Lytle Butte, MD  budesonide-formoterol [facility]) 160-4.5 MCG/ACT inhaler Inhale 2 puffs into the lungs 2 (two) times daily. 09/04/15  Yes Laverle Hobby, MD  ezetimibe (ZETIA) 10 MG tablet Take 1 tablet (10 mg total) by mouth daily. 06/05/15  Yes Minna Merritts, MD  hydrALAZINE (APRESOLINE) 25 MG tablet Take 1 tablet (25 mg total) by mouth 3 (three) times daily as needed. Patient taking differently: Take 25 mg by mouth 3 (three) times daily as needed (DIASTOLIC PRESSURE [id number]).  09/04/15  Yes Minna Merritts, MD  metoprolol succinate (TOPROL-XL) 25 MG 24 hr tablet Take 1 tablet (25 mg total) by mouth daily. 09/04/15  Yes Minna Merritts, MD  nitroGLYCERIN (NITROSTAT) 0.4 MG SL tablet Place 1 tablet (0.4 mg total) under the tongue  every 5 (five) minutes as needed for chest pain. Call 911 after 3rd tablet 09/04/15  Yes Minna Merritts, MD  omeprazole (PRILOSEC) 40 MG capsule Take 1 capsule (40 mg total) by mouth daily. 09/04/15  Yes Minna Merritts, MD  roflumilast (DALIRESP) 500 MCG TABS tablet Take 1 tablet (500 mcg total) by mouth daily. 09/04/15  Yes Laverle Hobby, MD  ticagrelor (BRILINTA) 60 MG TABS tablet Take 1 tablet (60 mg total) by mouth 2 (two) times daily. 12/16/14  Yes Minna Merritts, MD  tiotropium (SPIRIVA) 18 MCG inhalation capsule Place 1 capsule (18 mcg total) into inhaler and inhale daily. 09/04/15  Yes Laverle Hobby, MD    Review of Systems    As above, he has been doing well without chest pain, palpitations, PND, orthopnea, dizziness, syncope, or early satiety. He has had mild right ankle  edema since being on both amlodipine and prednisone. He also has chronic dyspnea on exertion in the setting of COPD.  All other systems reviewed and are otherwise negative except as noted above.  Physical Exam    VS:  BP 122/84 mmHg  Pulse 84  Ht 5\' 9"  (1.753 m)  Wt 188 lb 12.8 oz (85.639 kg)  BMI 27.87 kg/m2 , BMI Body mass index is 27.87 kg/(m^2). GEN: Well nourished, well developed, in no acute distress. HEENT: normal. Neck: Supple, no JVD, carotid bruits, or masses. Cardiac: RRR, no murmurs, rubs, or gallops. No clubbing, cyanosis. Trace right malleolar edema.  Radials/DP/PT 2+ and equal bilaterally. Right wrist catheterization site without bleeding, bruit, or hematoma. Bilateral upper extremities are quite bruised. Respiratory:  Respirations regular and unlabored, diminished breath sounds bilaterally. GI: Soft, nontender, nondistended, BS + x 4. MS: no deformity or atrophy. Skin: warm and dry, no rash. Neuro:  Strength and sensation are intact. Psych: Normal affect.  Accessory Clinical Findings    ECG - Regular sinus rhythm, 84, nonspecific T changes.  Assessment & Plan    1.  Coronary  artery disease: Status post recent catheterization in the setting of recurrent chest pain. Position revealed stable disease with severe small vessel circumflex and diagonal disease. These areas are not amenable to PCI. He remains on medical therapy and has been placed on amlodipine with good success and resolution of chest pain. He has had some swelling of the right ankle without tenderness or erythema. He has had similar swelling in the past when on prednisone tapers and as he has been on both amlodipine and prednisone, it's not clear which may be the culprit. He would like to continue amlodipine as the swelling is not that bothersome and the amlodipine has helped his chest pain. He otherwise remains on aspirin, reduced dose Brilinta, statin, and beta blocker.  2. COPD/nicotine abuse: He uses a vaporizer. He did start to feel better on the recent prednisone taper though this was stopped early this week in the setting of right ankle swelling. I advised that it would be safe to finish off the taper, taking 20 mg per 3 days, then 10 mg for 3 days, then 5 mg for 3 days. He otherwise remains on inhaler therapy and is followed by pulmonology.  3. Hypertensive heart disease: Stable on beta blocker and hydralazine.  4. Hyperlipidemia: He is on high potency Lipitor with an LDL of 73 in 05/2015.  5.  CKD III:  Creat stable prior to cath.  Limited contrast used.  6.  Dispo:  Cont amlodipine.  He has f/u with Dr. Rockey Situ in June and he will call us if right ankle swelling does not improve once off of prednisone at which time we may consider reducing amlodipine dose.  Murray Hodgkins, NP 10/09/2015, 12:51 PM

## 2015-10-15 ENCOUNTER — Telehealth: Payer: Self-pay | Admitting: Cardiovascular Disease

## 2015-10-15 MED ORDER — AMLODIPINE BESYLATE 5 MG PO TABS: 5.0000 mg | ORAL_TABLET | Freq: Every day | ORAL | Status: DC

## 2015-10-15 NOTE — Telephone Encounter (Signed)
*  STAT* If patient is at the pharmacy, call can be transferred to refill team.   1. Which medications need to be refilled? (please list name of each medication and dose if known) Amlodipine 5mg   2. Which pharmacy/location (including street and city if local pharmacy) is medication to be sent to? [facility] 3. Do they need a 30 day or 90 day supply? 30 day

## 2015-10-15 NOTE — Telephone Encounter (Signed)
Refill sent for amlodipine.  

## 2015-10-17 DIAGNOSIS — D229 Melanocytic nevi, unspecified: Secondary | ICD-10-CM | POA: Diagnosis not present

## 2015-10-24 ENCOUNTER — Telehealth: Payer: Self-pay | Admitting: Cardiovascular Disease

## 2015-10-24 NOTE — Telephone Encounter (Signed)
Patients wife called in and states that he had a reaction to amlodipine. He had some right foot swelling, left arm numbness, fatigue, and feeling washed out. So he stopped taking it today. He wants to see if he can start back on the Ranexa as he is now going to the [facility] clinic and he can get it through them. Instructed them to keep a log of blood pressures and heart rates so that we can see a trend of his blood pressures and she verbalized understanding. Let her know I would forward to Dr. Rockey Situ about medication problems.

## 2015-10-24 NOTE — Telephone Encounter (Signed)
Pt c/o medication issue:  1. Name of Medication: amlodipine 5 mg po daily   2. How are you currently taking this medication (dosage and times per day)? See above 3. Are you having a reaction (difficulty breathing--STAT)?  Stopped taking today - having L arm Numbness and fatigue  4. What is your medication issue?   Thinks he is having a reaction Also patient wants to try taking renexa again

## 2015-10-26 DIAGNOSIS — J449 Chronic obstructive pulmonary disease, unspecified: Secondary | ICD-10-CM | POA: Diagnosis not present

## 2015-10-27 NOTE — Telephone Encounter (Signed)
Okay to restart ranexa  Would definitely monitor blood pressure

## 2015-10-28 NOTE — Telephone Encounter (Addendum)
Spoke w/ pt's wife.  Advised her of Dr. Donivan Scull recommendation.  She is agreeable and will have pt continue to monitor BP.   She reports that pt was on Ranexa 500 mg BID, never increased up to 1000 mg BID. Advised her that typically pts take 500 mg BID x 1 week, then increase up to 1000 mg BID, but she insists that he was only on 500 mg tabs. Do you want him to continue on 500 mg?

## 2015-10-28 NOTE — Addendum Note (Signed)
Addended by: Dede Query R on: 10/28/2015 09:57 AM   Modules accepted: Orders, Medications

## 2015-10-30 ENCOUNTER — Telehealth: Payer: Self-pay | Admitting: Cardiovascular Disease

## 2015-10-30 MED ORDER — RANOLAZINE ER 500 MG PO TB12: 500.0000 mg | ORAL_TABLET | Freq: Two times a day (BID) | ORAL | Status: DC

## 2015-10-30 NOTE — Telephone Encounter (Signed)
500 mg BID ok

## 2015-10-30 NOTE — Addendum Note (Signed)
Addended by: Valora Corporal on: 10/30/2015 04:23 PM   Modules accepted: Orders

## 2015-10-30 NOTE — Telephone Encounter (Signed)
Spoke with patients wife and Ranexa ordered 500 mg twice a day. She stated that he had an upcoming appointment with Dr. Rockey Situ. She verbalized agreement and understanding with plan and had no further questions at this time.

## 2015-10-30 NOTE — Telephone Encounter (Signed)
Duplicate entry, see other note. Order sent in for Ranexa 500 mg twice a day.

## 2015-10-30 NOTE — Telephone Encounter (Signed)
Pt wife states pt received Ranexa 100 mg in the mail, she is not sure the doseage is correct. Please call and advise

## 2015-11-06 ENCOUNTER — Telehealth: Payer: Self-pay | Admitting: Cardiovascular Disease

## 2015-11-06 NOTE — Telephone Encounter (Signed)
Pt wife calling asking about medication pt is taking They would like to know if patient is to take 500 mg or 1000 mg of Renexa 2 x a day Please call back

## 2015-11-06 NOTE — Telephone Encounter (Signed)
Spoke with wife William Mueller) the patient received Ranexa 1000 mg from the medication management pharmacy but the patient's wife states,  the patient is only taking Ranexa 500 mg twice a day. I told William Mueller have have the patient cut the Ranexa 1000 mg in half and take 500 mg twice a day until William Mueller's follow up on November 21, 2015 with Dr. Rockey Situ.

## 2015-11-12 DIAGNOSIS — I200 Unstable angina: Secondary | ICD-10-CM | POA: Diagnosis not present

## 2015-11-12 DIAGNOSIS — N183 Chronic kidney disease, stage 3 (moderate): Secondary | ICD-10-CM | POA: Diagnosis not present

## 2015-11-12 DIAGNOSIS — I129 Hypertensive chronic kidney disease with stage 1 through stage 4 chronic kidney disease, or unspecified chronic kidney disease: Secondary | ICD-10-CM | POA: Diagnosis not present

## 2015-11-12 DIAGNOSIS — J42 Unspecified chronic bronchitis: Secondary | ICD-10-CM | POA: Diagnosis not present

## 2015-11-12 DIAGNOSIS — R809 Proteinuria, unspecified: Secondary | ICD-10-CM | POA: Diagnosis not present

## 2015-11-14 ENCOUNTER — Other Ambulatory Visit: Payer: Self-pay | Admitting: Cardiovascular Disease

## 2015-11-14 ENCOUNTER — Telehealth: Payer: Self-pay | Admitting: Internal Medicine

## 2015-11-14 NOTE — Telephone Encounter (Signed)
°  Pt c/o Shortness Of Breath: STAT if SOB developed within the last 24 hours or pt is noticeably SOB on the phone  1. Are you currently SOB (can you hear that pt is SOB on the phone)? Per wife yes a little bit   2. How long have you been experiencing SOB? 2 weeks   3. Are you SOB when sitting or when up moving around?   Both mostly on exertion though   4. Are you currently experiencing any other symptoms?   Weakness

## 2015-11-14 NOTE — Telephone Encounter (Signed)
States O2 is running between 91-94%. appt scheduled for Monday. States pt is fatigued and SOB but it has been for approx 2 weeks. Informed to go to ER over weekend if breathing get worse. Nothing further needed.

## 2015-11-17 ENCOUNTER — Encounter: Payer: Self-pay | Admitting: Internal Medicine

## 2015-11-17 ENCOUNTER — Ambulatory Visit (INDEPENDENT_AMBULATORY_CARE_PROVIDER_SITE_OTHER): Payer: Medicare Other | Admitting: Internal Medicine

## 2015-11-17 VITALS — BP 144/86 | HR 91 | Ht 69.0 in | Wt 187.0 lb

## 2015-11-17 DIAGNOSIS — J449 Chronic obstructive pulmonary disease, unspecified: Secondary | ICD-10-CM

## 2015-11-17 NOTE — Progress Notes (Signed)
[facility] Consultation      Assessment and Plan:  COPD. -Severe, COPD/emphysema,he has had a decrease in his functional status over the past year.  -We'll keep his PFT appointment, follow-up with him in 6 months --Notes that he feels significantly better after a few beers, this may indicate a significant anxiety component to his dyspnea, as he did fairly well on the walk test here today. Ok to have 1 to 2 beers per day for dyspnea.  --We also discussed increasing his activity level, I asked him to start with at least 20 minutes per day of sustained slow paced activity.  Chronic congestive diastolic heart failure. -This appears to be compensated at this time, we'll continue to monitor.  Nicotine abuse.  -Currently smoking electronic cigarettes. He is encouraged to wean down and quit.  GERD -Occasionally wakes up with phlegm in his throat, suspect this is more likely due to COPD and GERD. However, would recommend that he continue his current omeprazole.  Date: 11/17/2015  MRN# [id number] AQIL WAHLE 1951-05-21  Referring Physician: Dr. Rockey Situ.   William Mueller is a 65 y.o. old male seen in consultation for chief complaint of:    Chief Complaint  Patient presents with  . Acute Visit    pt c/o no energy, prod cough clear in color, increased sob, wheezing, occ cp/tightness concerned about 02 stat getting as low as 52 X3wk    HPI:   Patient is a 65 year old male with a history of hypertension, anxiety, COPD, GERD, chronic kidney disease stage III, coronary artery disease, congestive heart failure with diastolic dysfunction, nicotine addiction.  He is currently using spiriva daily, symbicort 2 puffs twice daily, and rinses mouth afterwards. He also takes daliresp daily. He returns today as he has noticed that he has been more tired and wheezing more over the past 3 weeks.  He is currently on symbicort 2 puffs bid, he notes that he did better with dulera. He quit  smoking in 2011, he uses Ecigs without nicotine every day. He uses rescue inhaler about twice per week.  He is still sleeping with dog.   He has never gone through pulmonary rehab because the distance is too far (15 mi) and the fuel would be too expensive.    Baseline sat on RA at rest was 96% and HR 74. After walking 1200 feet sat was 93% and HR 107. He had minimal dyspnea, mild leg fatigue.   Review of previous testing, and old records. -Echocardiogram 04/08/2015 EF of [id number], normal systolic pressure, diastolic dysfunction, possibly contributing to significant dyspnea. -Spirometry from 04/17/2015, FVC was 64%, FEV1 was 27%, this appears to be significant for severe emphysema. Though a previous test showed that this was not as severe.  -The patient was previously seen by Dr. Raul Del. He was managed for COPD with Symbicort, Spiriva, Daliresp.  Medication:   Current Outpatient Rx  Name  Route  Sig  Dispense  Refill  . acetaminophen (TYLENOL) 500 MG tablet   Oral   Take 1,000 mg by mouth daily as needed for headache.          . albuterol (PROVENTIL HFA;VENTOLIN HFA) 108 (90 Base) MCG/ACT inhaler   Inhalation   Inhale 1-2 puffs into the lungs every 6 (six) hours as needed for wheezing or shortness of breath.   3 Inhaler   1   . albuterol (PROVENTIL) (2.5 MG/3ML) 0.083% nebulizer solution   Nebulization   Take 3 mLs (2.5 mg total)  by nebulization every 6 (six) hours as needed for wheezing or shortness of breath.   75 mL   12   . ALPRAZolam (XANAX XR) 3 MG 24 hr tablet   Oral   Take 3 mg by mouth every evening.          Marland Kitchen ALPRAZolam (XANAX) 1 MG tablet   Oral   Take 1 mg by mouth daily.          Marland Kitchen aspirin 81 MG EC tablet   Oral   Take 1 tablet (81 mg total) by mouth daily.   90 tablet   3   . atorvastatin (LIPITOR) 80 MG tablet   Oral   Take 1 tablet (80 mg total) by mouth daily.   30 tablet   0   . budesonide-formoterol (SYMBICORT) 160-4.5 MCG/ACT inhaler    Inhalation   Inhale 2 puffs into the lungs 2 (two) times daily.   3 Inhaler   1   . ezetimibe (ZETIA) 10 MG tablet   Oral   Take 1 tablet (10 mg total) by mouth daily.   90 tablet   3   . hydrALAZINE (APRESOLINE) 25 MG tablet   Oral   Take 1 tablet (25 mg total) by mouth 3 (three) times daily as needed. Patient taking differently: Take 25 mg by mouth 3 (three) times daily as needed (DIASTOLIC PRESSURE [id number]).    270 tablet   3   . metoprolol succinate (TOPROL-XL) 25 MG 24 hr tablet   Oral   Take 1 tablet (25 mg total) by mouth daily.   90 tablet   3   . NITROSTAT 0.4 MG SL tablet      PLACE 1 TABLET UNDER TONGUE EVERY 5 MINUTES AS NEEDED FOR CHEST PAIN.CALL 911 AFTER 3RD TABLET.   25 tablet   1   . omeprazole (PRILOSEC) 40 MG capsule   Oral   Take 1 capsule (40 mg total) by mouth daily.   90 capsule   3   . ranolazine (RANEXA) 500 MG 12 hr tablet   Oral   Take 1 tablet (500 mg total) by mouth 2 (two) times daily.   60 tablet   6   . roflumilast (DALIRESP) 500 MCG TABS tablet   Oral   Take 1 tablet (500 mcg total) by mouth daily.   90 tablet   1   . ticagrelor (BRILINTA) 60 MG TABS tablet   Oral   Take 1 tablet (60 mg total) by mouth 2 (two) times daily.   60 tablet   11   . tiotropium (SPIRIVA) 18 MCG inhalation capsule   Inhalation   Place 1 capsule (18 mcg total) into inhaler and inhale daily.   90 capsule   1       Allergies:  Benzodiazepines; Paroxetine hcl; Serotonin reuptake inhibitors (ssris); Diazepam; Doxycycline monohydrate; Escitalopram oxalate; Isosorbide nitrate; Lorazepam; and Tetracyclines & related  Review of Systems: Gen:  Denies  fever, sweats, chills HEENT: Denies blurred vision, double vision. bleeds, sore throat Cvc:  No dizziness, chest pain. Resp:   Denies cough or sputum porduction, shortness of breath Gi: Denies swallowing difficulty, stomach pain. Gu:  Denies bladder incontinence, burning urine Ext:   No Joint pain,  stiffness. Skin: No skin rash,  hives Endoc:  No polyuria, polydipsia. Psych: No depression, insomnia. Other:  All other systems were reviewed with the patient and were negative other that what is mentioned in the HPI.   Physical Examination:  VS: BP 144/86 mmHg  Pulse 91  Ht 5\' 9"  (1.753 m)  Wt 187 lb (84.823 kg)  BMI 27.60 kg/m2  SpO2 98%  General Appearance: No distress  Neuro:without focal findings,  speech normal,  HEENT: PERRLA, EOM intact.   Pulmonary: normal breath sounds, No wheezing. Decreased air entry bilaterally.  CardiovascularNormal S1,S2.  No m/r/g.   Abdomen: Benign, Soft, non-tender. Renal:  No costovertebral tenderness  GU:  No performed at this time. Endoc: No evident thyromegaly, no signs of acromegaly. Skin:   warm, no rashes, no ecchymosis  Extremities: normal, no cyanosis, clubbing.  Other findings:    LABORATORY PANEL:   CBC No results for input(s): WBC, HGB, HCT, PLT in the last 168 hours. ------------------------------------------------------------------------------------------------------------------  Chemistries  No results for input(s): NA, K, CL, CO2, GLUCOSE, BUN, CREATININE, CALCIUM, MG, AST, ALT, ALKPHOS, BILITOT in the last 168 hours.  Invalid input(s): GFRCGP ------------------------------------------------------------------------------------------------------------------  Cardiac Enzymes No results for input(s): TROPONINI in the last 168 hours. ------------------------------------------------------------  RADIOLOGY:  No results found.     Thank  you for the consultation and for allowing [facility] to assist in the care of your patient. Our recommendations are noted above.  Please contact us if we can be of further service.   Marda Stalker, MD.  Board Certified in Internal Medicine, Pulmonary Medicine, [place], and Sleep Medicine.  [facility] Office  Number: [phone]  Patricia Pesa, M.D.  Vilinda Boehringer, M.D.  Merton Border, M.D

## 2015-11-17 NOTE — Patient Instructions (Addendum)
--  Increase activity.   --Stop smoking electronic cigarettes.   --Use rescue inhaler when you feel short winded.

## 2015-11-19 ENCOUNTER — Ambulatory Visit (INDEPENDENT_AMBULATORY_CARE_PROVIDER_SITE_OTHER): Payer: Medicare Other | Admitting: Cardiovascular Disease

## 2015-11-19 ENCOUNTER — Encounter: Payer: Self-pay | Admitting: Cardiovascular Disease

## 2015-11-19 VITALS — BP 118/90 | HR 76 | Ht 69.0 in | Wt 186.2 lb

## 2015-11-19 DIAGNOSIS — E785 Hyperlipidemia, unspecified: Secondary | ICD-10-CM | POA: Diagnosis not present

## 2015-11-19 DIAGNOSIS — N183 Chronic kidney disease, stage 3 unspecified: Secondary | ICD-10-CM

## 2015-11-19 DIAGNOSIS — I471 Supraventricular tachycardia: Secondary | ICD-10-CM

## 2015-11-19 DIAGNOSIS — I2510 Atherosclerotic heart disease of native coronary artery without angina pectoris: Secondary | ICD-10-CM

## 2015-11-19 DIAGNOSIS — I10 Essential (primary) hypertension: Secondary | ICD-10-CM | POA: Diagnosis not present

## 2015-11-19 DIAGNOSIS — Z9861 Coronary angioplasty status: Secondary | ICD-10-CM

## 2015-11-19 DIAGNOSIS — J432 Centrilobular emphysema: Secondary | ICD-10-CM

## 2015-11-19 MED ORDER — ATORVASTATIN CALCIUM 80 MG PO TABS: 80.0000 mg | ORAL_TABLET | Freq: Every day | ORAL | Status: DC

## 2015-11-19 NOTE — Progress Notes (Signed)
Patient ID: William Mueller, male   DOB: Jul 04, 1950, 65 y.o.   MRN: [id number] Cardiology Office Note  Date:  11/19/2015   ID:  William Mueller, DOB 03/09/1951, MRN [id number]  PCP:  William Sake, MD   Chief Complaint  Patient presents with  . other    1 month f/u reaction to amlodipine c/o sob. Meds reviewed verbally.    HPI:  William Mueller is a very pleasant 65 year old gentleman with a long history of smoking, COPD, chronic renal insufficiency, who presented 12/11/2013 to [facility] with chest pain. He was taken to the cardiac catheterization lab where he was found to have an occluded mid RCA. Stent was placed. Repeat catheterization in October 2016 with patent stent, but did have disease of a small diagonal and distal circumflex not amenable to intervention. He presents today for follow-up of his coronary artery disease  He was last seen in April 2017   restarted him on prednisone for COPD exacerbation Several days later he went to the emergency room with chest tightness, kept in the hospital, refused stress test, had cardiac catheterization showing patent stent, disease was relatively unchanged Date of catheterization 09/29/2015 Echocardiogram confirmed ejection fraction 65%  In follow-up, reports that he feels well, recent seen by pulmonary Ambulating well without any significant problems Lipitor was increased up to 80 mg daily at discharge from the hospital He is doing nebulizers once or twice per day  Other past medical history Previously did a PFT, technician reported that he passed out. He does not remember any significant difficulty but the test was abruptly stopped  Unable to tolerate isosorbide secondary to headache previously total cholesterol was 200  he presented to the hospital 02/26/2014 with chest pain. He seen by cardiology, rule out for MI, had normal EKG and discharged home. Echocardiogram July 2015 showed ejection fraction 55-60%  Creatinine in 10/22/2013 was  2.1  PMH:   has a past medical history of Hypertension; Anxiety; COPD (chronic obstructive pulmonary disease) ([place]); High cholesterol; GERD (gastroesophageal reflux disease); Daily headache; Chronic kidney disease (CKD), stage III (moderate); Coronary artery disease; Depression; Diastolic dysfunction; Chronic chest pain; Nicotine addiction; and Sleep apnea.  PSH:    Past Surgical History  Procedure Laterality Date  . Cardiac catheterization      MC x 1 stent  . Coronary angioplasty    . Left heart catheterization with coronary angiogram N/A 12/12/2013    Procedure: LEFT HEART CATHETERIZATION WITH CORONARY ANGIOGRAM;  Surgeon: William Hampshire, MD;  Location: [facility];  Service: Cardiovascular;  Laterality: N/A;  . Cardiac catheterization Left 03/12/2015    Procedure: Left Heart Cath and Coronary Angiography;  Surgeon: William Man, MD;  Location: [facility];  Service: Cardiovascular;  Laterality: Left;  . Cardiac catheterization N/A 09/29/2015    Procedure: Left Heart Cath and Coronary Angiography;  Surgeon: William Hampshire, MD;  Location: [facility];  Service: Cardiovascular;  Laterality: N/A;    Current Outpatient Prescriptions  Medication Sig Dispense Refill  . acetaminophen (TYLENOL) 500 MG tablet Take 1,000 mg by mouth daily as needed for headache.     . albuterol (PROVENTIL HFA;VENTOLIN HFA) 108 (90 Base) MCG/ACT inhaler Inhale 1-2 puffs into the lungs every 6 (six) hours as needed for wheezing or shortness of breath. 3 Inhaler 1  . albuterol (PROVENTIL) (2.5 MG/3ML) 0.083% nebulizer solution Take 3 mLs (2.5 mg total) by nebulization every 6 (six) hours as needed for wheezing or shortness of breath.  75 mL 12  . ALPRAZolam (XANAX XR) 3 MG 24 hr tablet Take 3 mg by mouth every evening.     Marland Kitchen ALPRAZolam (XANAX) 1 MG tablet Take 1 mg by mouth daily.     Marland Kitchen aspirin 81 MG EC tablet Take 1 tablet (81 mg total) by mouth daily. 90 tablet 3  . atorvastatin (LIPITOR) 80 MG  tablet Take 1 tablet (80 mg total) by mouth daily. 90 tablet 3  . budesonide-formoterol (SYMBICORT) 160-4.5 MCG/ACT inhaler Inhale 2 puffs into the lungs 2 (two) times daily. 3 Inhaler 1  . ezetimibe (ZETIA) 10 MG tablet Take 1 tablet (10 mg total) by mouth daily. 90 tablet 3  . hydrALAZINE (APRESOLINE) 25 MG tablet Take 1 tablet (25 mg total) by mouth 3 (three) times daily as needed. (Patient taking differently: Take 25 mg by mouth 3 (three) times daily as needed (DIASTOLIC PRESSURE [id number]). ) 270 tablet 3  . metoprolol succinate (TOPROL-XL) 25 MG 24 hr tablet Take 1 tablet (25 mg total) by mouth daily. 90 tablet 3  . NITROSTAT 0.4 MG SL tablet PLACE 1 TABLET UNDER TONGUE EVERY 5 MINUTES AS NEEDED FOR CHEST PAIN.CALL 911 AFTER 3RD TABLET. 25 tablet 1  . omeprazole (PRILOSEC) 40 MG capsule Take 1 capsule (40 mg total) by mouth daily. 90 capsule 3  . ranolazine (RANEXA) 500 MG 12 hr tablet Take 1 tablet (500 mg total) by mouth 2 (two) times daily. (Patient taking differently: Take 500 mg by mouth daily. ) 60 tablet 6  . roflumilast (DALIRESP) 500 MCG TABS tablet Take 1 tablet (500 mcg total) by mouth daily. 90 tablet 1  . ticagrelor (BRILINTA) 60 MG TABS tablet Take 1 tablet (60 mg total) by mouth 2 (two) times daily. 60 tablet 11  . tiotropium (SPIRIVA) 18 MCG inhalation capsule Place 1 capsule (18 mcg total) into inhaler and inhale daily. 90 capsule 1   No current facility-administered medications for this visit.     Allergies:   Benzodiazepines; Paroxetine hcl; Serotonin reuptake inhibitors (ssris); Amlodipine; Diazepam; Doxycycline monohydrate; Escitalopram oxalate; Isosorbide nitrate; Lorazepam; and Tetracyclines & related   Social History:  The patient  reports that he quit smoking about 6 years ago. His smoking use included Cigarettes and E-cigarettes. He has a 144 pack-year smoking history. He has never used smokeless tobacco. He reports that he drinks about 3.6 oz of alcohol per week. He  reports that he does not use illicit drugs.   Family History:   family history includes Coronary artery disease in his brother; Heart disease in his father; Heart disease (age of onset: 43) in his brother. There is no history of Kidney disease or Prostate cancer.    Review of Systems: Review of Systems  Constitutional: Negative.   Respiratory: Positive for shortness of breath.   Cardiovascular: Negative.   Gastrointestinal: Negative.   Musculoskeletal: Negative.   Neurological: Negative.   Psychiatric/Behavioral: Negative.   All other systems reviewed and are negative.    PHYSICAL EXAM: VS:  BP 118/90 mmHg  Pulse 76  Ht 5\' 9"  (1.753 m)  Wt 186 lb 4 oz (84.482 kg)  BMI 27.49 kg/m2  SpO2 98% , BMI Body mass index is 27.49 kg/(m^2). GEN: Well nourished, well developed, in no acute distress HEENT: normal Neck: no JVD, carotid bruits, or masses Cardiac: RRR; no murmurs, rubs, or gallops,no edema  Respiratory:Mildly decreased breath sounds throughout, normal work of breathing  GI: soft, nontender, nondistended, + BS MS: no deformity or atrophy Skin:  warm and dry, no rash Neuro:  Strength and sensation are intact Psych: euthymic mood, full affect    Recent Labs: 11/22/2014: B Natriuretic Peptide 10.6 05/28/2015: ALT 18 09/28/2015: BUN 11; Creatinine, Ser 1.61*; Hemoglobin 13.0; Platelets 236; Potassium 3.8; Sodium 139    Lipid Panel Lab Results  Component Value Date   CHOL 156 05/28/2015   HDL 54 05/28/2015   LDLCALC 73 05/28/2015   TRIG 146 05/28/2015      Wt Readings from Last 3 Encounters:  11/19/15 186 lb 4 oz (84.482 kg)  11/17/15 187 lb (84.823 kg)  10/09/15 188 lb 12.8 oz (85.639 kg)       ASSESSMENT AND PLAN:  Essential hypertension Blood pressure is well controlled on today's visit. No changes made to the medications. He did not tolerate amlodipine which was started while he was in the hospital  Hyperlipidemia Recently increased up to Lipitor 80 mg  daily Goal LDL less than 70  Atrial tachycardia, paroxysmal (HCC) Denies having any tachycardia concerning for arrhythmia  CAD S/P PCI DES to RCA Recent cardiac catheterization as above, stable disease, patent RCA stent  CKD (chronic kidney disease) stage 3, GFR 30-59 ml/min Creatinine 1.6, stable  Centrilobular emphysema (HCC) Followed by pulmonary, uses nebulizers, inhalers   stop smoking several years ago   Disposition:   F/U  6 months   Total encounter time more than 15 minutes  Greater than 50% was spent in counseling and coordination of care with the patient   Signed, Esmond Plants, M.D., Ph.D. 11/19/2015  [place] [id number] was last seen in April 2017

## 2015-11-19 NOTE — Patient Instructions (Signed)
You are doing well. No medication changes were made.  Please call us if you have new issues that need to be addressed before your next appt.  Your physician wants you to follow-up in: 6 months.  You will receive a reminder letter in the mail two months in advance. If you don't receive a letter, please call our office to schedule the follow-up appointment.   

## 2015-11-26 DIAGNOSIS — J449 Chronic obstructive pulmonary disease, unspecified: Secondary | ICD-10-CM | POA: Diagnosis not present

## 2015-12-08 ENCOUNTER — Telehealth: Payer: Self-pay | Admitting: Cardiovascular Disease

## 2015-12-08 ENCOUNTER — Telehealth: Payer: Self-pay | Admitting: Internal Medicine

## 2015-12-08 NOTE — Telephone Encounter (Signed)
Patient had to r/s pft as patient is not feeling well.   Having weakness in legs, sweats in the morning and blurred vision , HR is running in 120's , upset stomach.  Patient was bitten by a tick June 10th  (had a spot on the back)

## 2015-12-08 NOTE — Telephone Encounter (Signed)
Spoke w/ pt's wife.  Advised her of Dr. Donivan Scull recommendation.  She reports that Zetia is to expensive at her local pharmacy that she would like to wait and see if it comes back in stock @ [facility].  Asked her to call back if there is a longer delay.

## 2015-12-08 NOTE — Telephone Encounter (Signed)
Would wait until zetia gets back in stock unless he would like it from a different pharmacy where he needs to pay

## 2015-12-08 NOTE — Telephone Encounter (Signed)
[facility] does not have Zetia in stock at the moment, they are waiting on a shipment to arrive. [name] would like to know if pt is ok to wait or if Dr. Rockey Situ would to put him on an alternative med.

## 2015-12-08 NOTE — Telephone Encounter (Signed)
[facility] not [facility] any zetia for patient refill.  Patient has not been able to take since last dose on Saturday.  Would Dr. Rockey Situ suggest him taking an alternative medication ?

## 2015-12-08 NOTE — Telephone Encounter (Signed)
Pt has already rescheduled PFT to 12/23/15. Nothing further needed.

## 2015-12-08 NOTE — Telephone Encounter (Signed)
Error

## 2015-12-10 ENCOUNTER — Ambulatory Visit: Payer: Medicare Other | Admitting: Internal Medicine

## 2015-12-16 DIAGNOSIS — L853 Xerosis cutis: Secondary | ICD-10-CM | POA: Diagnosis not present

## 2015-12-16 DIAGNOSIS — D225 Melanocytic nevi of trunk: Secondary | ICD-10-CM | POA: Diagnosis not present

## 2015-12-16 DIAGNOSIS — D485 Neoplasm of uncertain behavior of skin: Secondary | ICD-10-CM | POA: Diagnosis not present

## 2015-12-16 DIAGNOSIS — D229 Melanocytic nevi, unspecified: Secondary | ICD-10-CM | POA: Diagnosis not present

## 2015-12-16 DIAGNOSIS — D692 Other nonthrombocytopenic purpura: Secondary | ICD-10-CM | POA: Diagnosis not present

## 2015-12-16 DIAGNOSIS — L570 Actinic keratosis: Secondary | ICD-10-CM | POA: Diagnosis not present

## 2015-12-23 ENCOUNTER — Ambulatory Visit (INDEPENDENT_AMBULATORY_CARE_PROVIDER_SITE_OTHER): Payer: Medicare Other | Admitting: *Deleted

## 2015-12-23 DIAGNOSIS — J438 Other emphysema: Secondary | ICD-10-CM | POA: Diagnosis not present

## 2015-12-23 LAB — PULMONARY FUNCTION TEST
DL/VA % PRED: 63 %
DL/VA: 2.9 ml/min/mmHg/L
DLCO UNC % PRED: 46 %
DLCO unc: 14.36 ml/min/mmHg
FEF 25-75 POST: 0.51 L/s
FEF 25-75 Pre: 0.39 L/sec
FEF2575-%Change-Post: 31 %
FEF2575-%PRED-POST: 20 %
FEF2575-%Pred-Pre: 15 %
FEV1-%Change-Post: 14 %
FEV1-%Pred-Post: 31 %
FEV1-%Pred-Pre: 27 %
FEV1-POST: 1.04 L
FEV1-Pre: 0.91 L
FEV1FVC-%Change-Post: 3 %
FEV1FVC-%PRED-PRE: 58 %
FEV6-%CHANGE-POST: 11 %
FEV6-%PRED-POST: 54 %
FEV6-%Pred-Pre: 48 %
FEV6-POST: 2.25 L
FEV6-Pre: 2.01 L
FEV6FVC-%CHANGE-POST: 0 %
FEV6FVC-%PRED-POST: 101 %
FEV6FVC-%PRED-PRE: 100 %
FVC-%CHANGE-POST: 11 %
FVC-%PRED-POST: 53 %
FVC-%Pred-Pre: 47 %
FVC-Post: 2.33 L
FVC-Pre: 2.1 L
POST FEV1/FVC RATIO: 44 %
Post FEV6/FVC ratio: 96 %
Pre FEV1/FVC ratio: 43 %
Pre FEV6/FVC Ratio: 96 %

## 2015-12-23 NOTE — Progress Notes (Signed)
PFT performed today with Nitrogen washout. 

## 2015-12-26 DIAGNOSIS — J449 Chronic obstructive pulmonary disease, unspecified: Secondary | ICD-10-CM | POA: Diagnosis not present

## 2015-12-30 DIAGNOSIS — E663 Overweight: Secondary | ICD-10-CM | POA: Diagnosis not present

## 2015-12-30 DIAGNOSIS — F419 Anxiety disorder, unspecified: Secondary | ICD-10-CM | POA: Diagnosis not present

## 2015-12-30 DIAGNOSIS — Z6826 Body mass index (BMI) 26.0-26.9, adult: Secondary | ICD-10-CM | POA: Diagnosis not present

## 2015-12-30 DIAGNOSIS — Z79899 Other long term (current) drug therapy: Secondary | ICD-10-CM | POA: Diagnosis not present

## 2015-12-31 ENCOUNTER — Telehealth: Payer: Self-pay | Admitting: Internal Medicine

## 2015-12-31 NOTE — Telephone Encounter (Signed)
I have printed pt's PFT results and placed them in your folder. Pt is not scheduled to f/u with you until Dec 2017. Please advise on results or if you want me to schedule an appt for pt. Thanks.

## 2015-12-31 NOTE — Telephone Encounter (Signed)
Patients wife calling for results of the PFT.

## 2016-01-06 NOTE — Telephone Encounter (Signed)
I spoke with patient about results and he verbalized understanding and had no questions 

## 2016-01-06 NOTE — Telephone Encounter (Signed)
Reviewed PFT, continues to show severe emphysema, FEV1=27%; which is the same as his last study. No new recommendations at this time.

## 2016-01-07 ENCOUNTER — Telehealth: Payer: Self-pay | Admitting: Cardiovascular Disease

## 2016-01-07 NOTE — Telephone Encounter (Signed)
Patient wants copy of results mailed to home address.  Please call when mailed.

## 2016-01-07 NOTE — Telephone Encounter (Signed)
Patient spouse dropped off [employer] assistance forms.  Placed in refill box.

## 2016-01-07 NOTE — Telephone Encounter (Signed)
Patient spouse , [name] in office now.  Results printed for her.

## 2016-01-09 NOTE — Telephone Encounter (Signed)
Zetia patient assistance forms are with Dr. Rockey Situ for signature

## 2016-01-12 NOTE — Telephone Encounter (Signed)
Mailed patient assistance forms today.  Awaiting for approval for Zetia.

## 2016-01-26 DIAGNOSIS — J449 Chronic obstructive pulmonary disease, unspecified: Secondary | ICD-10-CM | POA: Diagnosis not present

## 2016-02-23 ENCOUNTER — Emergency Department: Payer: Medicare Other

## 2016-02-23 ENCOUNTER — Encounter: Payer: Self-pay | Admitting: *Deleted

## 2016-02-23 ENCOUNTER — Emergency Department
Admission: EM | Admit: 2016-02-23 | Discharge: 2016-02-23 | Disposition: A | Discharge status: Home / Self Care | Payer: Medicare Other | Attending: Emergency Medicine | Admitting: Emergency Medicine

## 2016-02-23 DIAGNOSIS — Z79899 Other long term (current) drug therapy: Secondary | ICD-10-CM | POA: Diagnosis not present

## 2016-02-23 DIAGNOSIS — R0789 Other chest pain: Secondary | ICD-10-CM | POA: Diagnosis not present

## 2016-02-23 DIAGNOSIS — R079 Chest pain, unspecified: Secondary | ICD-10-CM | POA: Diagnosis not present

## 2016-02-23 DIAGNOSIS — Z87891 Personal history of nicotine dependence: Secondary | ICD-10-CM | POA: Diagnosis not present

## 2016-02-23 DIAGNOSIS — I129 Hypertensive chronic kidney disease with stage 1 through stage 4 chronic kidney disease, or unspecified chronic kidney disease: Secondary | ICD-10-CM | POA: Insufficient documentation

## 2016-02-23 DIAGNOSIS — R1084 Generalized abdominal pain: Secondary | ICD-10-CM

## 2016-02-23 DIAGNOSIS — K5900 Constipation, unspecified: Secondary | ICD-10-CM | POA: Diagnosis not present

## 2016-02-23 DIAGNOSIS — N183 Chronic kidney disease, stage 3 (moderate): Secondary | ICD-10-CM | POA: Diagnosis not present

## 2016-02-23 DIAGNOSIS — I2510 Atherosclerotic heart disease of native coronary artery without angina pectoris: Secondary | ICD-10-CM | POA: Diagnosis not present

## 2016-02-23 DIAGNOSIS — R1030 Lower abdominal pain, unspecified: Secondary | ICD-10-CM | POA: Diagnosis not present

## 2016-02-23 DIAGNOSIS — Z7982 Long term (current) use of aspirin: Secondary | ICD-10-CM | POA: Insufficient documentation

## 2016-02-23 DIAGNOSIS — J449 Chronic obstructive pulmonary disease, unspecified: Secondary | ICD-10-CM | POA: Insufficient documentation

## 2016-02-23 DIAGNOSIS — R109 Unspecified abdominal pain: Secondary | ICD-10-CM | POA: Diagnosis not present

## 2016-02-23 LAB — URINALYSIS COMPLETE WITH MICROSCOPIC (ARMC ONLY)
BACTERIA UA: NONE SEEN
Bilirubin Urine: NEGATIVE
Glucose, UA: NEGATIVE mg/dL
Hgb urine dipstick: NEGATIVE
KETONES UR: NEGATIVE mg/dL
LEUKOCYTES UA: NEGATIVE
Nitrite: NEGATIVE
PROTEIN: NEGATIVE mg/dL
RBC / HPF: NONE SEEN RBC/hpf (ref 0–5)
Specific Gravity, Urine: 1.005 (ref 1.005–1.030)
pH: 6 (ref 5.0–8.0)

## 2016-02-23 LAB — CBC
HEMATOCRIT: 43 % (ref 40.0–52.0)
Hemoglobin: 14.9 g/dL (ref 13.0–18.0)
MCH: 30.8 pg (ref 26.0–34.0)
MCHC: 34.7 g/dL (ref 32.0–36.0)
MCV: 88.9 fL (ref 80.0–100.0)
PLATELETS: 237 10*3/uL (ref 150–440)
RBC: 4.83 MIL/uL (ref 4.40–5.90)
RDW: 13 % (ref 11.5–14.5)
WBC: 8.5 10*3/uL (ref 3.8–10.6)

## 2016-02-23 LAB — COMPREHENSIVE METABOLIC PANEL
ALT: 21 U/L (ref 17–63)
AST: 22 U/L (ref 15–41)
Albumin: 4.1 g/dL (ref 3.5–5.0)
Alkaline Phosphatase: 89 U/L (ref 38–126)
Anion gap: 6 (ref 5–15)
BUN: 16 mg/dL (ref 6–20)
CHLORIDE: 101 mmol/L (ref 101–111)
CO2: 29 mmol/L (ref 22–32)
CREATININE: 1.73 mg/dL — AB (ref 0.61–1.24)
Calcium: 9.1 mg/dL (ref 8.9–10.3)
GFR calc Af Amer: 46 mL/min — ABNORMAL LOW (ref >60)
GFR calc non Af Amer: 39 mL/min — ABNORMAL LOW (ref >60)
Glucose, Bld: 96 mg/dL (ref 65–99)
POTASSIUM: 4.3 mmol/L (ref 3.5–5.1)
SODIUM: 136 mmol/L (ref 135–145)
Total Bilirubin: 0.4 mg/dL (ref 0.3–1.2)
Total Protein: 7.5 g/dL (ref 6.5–8.1)

## 2016-02-23 LAB — LIPASE, BLOOD: Lipase: 32 U/L (ref 11–51)

## 2016-02-23 LAB — TROPONIN I: Troponin I: <0.03 ng/mL (ref <0.03)

## 2016-02-23 MED ORDER — SIMETHICONE 80 MG PO CHEW: 80.0000 mg | CHEWABLE_TABLET | Freq: Four times a day (QID) | ORAL | 0 refills | Status: DC | PRN

## 2016-02-23 MED ORDER — IOPAMIDOL (ISOVUE-300) INJECTION 61%
75.0000 mL | Freq: Once | INTRAVENOUS | Status: AC | PRN
  Administered 2016-02-23: 75 mL via INTRAVENOUS
  Filled 2016-02-23: qty 75

## 2016-02-23 MED ORDER — IOPAMIDOL (ISOVUE-300) INJECTION 61%
30.0000 mL | Freq: Once | INTRAVENOUS | Status: AC
Start: 2016-02-23 — End: 2016-02-23
  Administered 2016-02-23: 30 mL via ORAL
  Filled 2016-02-23: qty 30

## 2016-02-23 MED ORDER — SENNOSIDES-DOCUSATE SODIUM 8.6-50 MG PO TABS: 2.0000 | ORAL_TABLET | Freq: Every day | ORAL | 0 refills | Status: DC | PRN

## 2016-02-23 NOTE — ED Triage Notes (Signed)
States abd pain for several weeks, denies any nausea or vomiting, states pain is getting worse, states indigestion and not being able to burp, states constipation, pt awake and alert in no distress

## 2016-02-23 NOTE — ED Notes (Signed)
Dr. Jimmye Norman signed EKG

## 2016-02-23 NOTE — Discharge Instructions (Signed)
Please follow the instructions for high fiber diet. You may also take a stool softener daily to prevent constipation.  Please make a follow-up appointment with your primary care physician.  Return to the emergency department if you develop severe pain, inability to keep down fluids, fever, or any other symptoms concerning to you.

## 2016-02-23 NOTE — ED Provider Notes (Signed)
[facility] Emergency Department Provider Note  ____________________________________________  Time seen: Approximately 6:17 PM  I have reviewed the triage vital signs and the nursing notes.   HISTORY  Chief Complaint Abdominal Pain    HPI William Mueller is a 65 y.o. male with a history of CAD status post MI, GERD on omeprazole, COPD, presenting with chest pain, abdominal pain.The patient reports that over the past week he has had a dull diffuse mid abdominal pain with associated sharp pains in the lower abdomen. No testicular or scrotal pain. He has also had an indigestion like feeling, but has been unable to burp to make himself feel better. He stopped his omeprazole 2 days ago, and thinks that this has helped his symptoms. He has not had any nausea or vomiting, or diarrhea. He has not had a bowel movement in 2 days, which is unusual for him. No history of abdominal surgeries. The patient also describes an intermittent central chest pressure which she has had for several years since his heart attack which is unchanged in character and severity. No syncope or lightheadedness, no palpitations. Patient drinks 2-3 beers daily.   Past Medical History:  Diagnosis Date  . Anxiety   . Chronic chest pain   . Chronic kidney disease (CKD), stage III (moderate)   . COPD (chronic obstructive pulmonary disease) ([place])   . Coronary artery disease    a. 12/2013 PCI: mRCA 100% with L to R collats s/p PCI/DES;  b. 06/2014 MV: no ischemia/infarct, EF 53%;  c. 03/2015 Cath: LM nl, LAD nl, D1 80 (1.63mm), LCX 9m (<1.42mm), OM1 nl, RCA 55p, patent stent, RPDA nl, EF 65%; d. 09/2015 Cath: LM nl, LAD 32m, D1 80 (<30mm), LCX 59m/d (<1.64mm), OM1 nl, RCA 55p (FFR 0.93), patent stent, RPDA nl-->Med Rx.  . Daily headache   . Depression   . Diastolic dysfunction    a. echo 12/2013: EF 55-60%, mild LVH, GR1DD, inf HK, elevated CVP, mildly dilated IVC suggestive of increased RA pressure  . GERD  (gastroesophageal reflux disease)   . High cholesterol   . Hypertension   . Nicotine addiction    a. using [employer].  . Sleep apnea     Patient Active Problem List   Diagnosis Date Noted  . Nicotine addiction   . CAD S/P PCI DES to RCA 03/11/2015  . Diastolic dysfunction   . Chronic chest pain   . Chest pain at rest 11/23/2014  . GERD (gastroesophageal reflux disease) 06/08/2014  . CKD (chronic kidney disease) stage 3, GFR 30-59 ml/min 05/04/2014  . Bilateral leg pain 05/04/2014  . Lung nodule < 6cm on CT 05/04/2014  . Abnormal CT scan, kidney 05/04/2014  . Unstable angina ([place]) 05/04/2014  . Chest pain 03/22/2014  . Atrial tachycardia, paroxysmal ([place]) 12/14/2013  . Atherosclerosis of native coronary artery with unstable angina pectoris ([place]) 12/13/2013  . NSTEMI (non-ST elevated myocardial infarction) ([place]) 12/13/2013  . Essential hypertension 12/11/2013  . Acute renal failure superimposed on stage 3 chronic kidney disease ([place]) 12/11/2013  . Hyperlipidemia 12/11/2013  . COPD (chronic obstructive pulmonary disease) ([place]) 12/11/2013    Past Surgical History:  Procedure Laterality Date  . CARDIAC CATHETERIZATION     MC x 1 stent  . CARDIAC CATHETERIZATION Left 03/12/2015   Procedure: Left Heart Cath and Coronary Angiography;  Surgeon: Leonie Man, MD;  Location: [facility];  Service: Cardiovascular;  Laterality: Left;  . CARDIAC CATHETERIZATION N/A 09/29/2015   Procedure: Left  Heart Cath and Coronary Angiography;  Surgeon: Wellington Hampshire, MD;  Location: [facility];  Service: Cardiovascular;  Laterality: N/A;  . CORONARY ANGIOPLASTY    . LEFT HEART CATHETERIZATION WITH CORONARY ANGIOGRAM N/A 12/12/2013   Procedure: LEFT HEART CATHETERIZATION WITH CORONARY ANGIOGRAM;  Surgeon: Wellington Hampshire, MD;  Location: [facility];  Service: Cardiovascular;  Laterality: N/A;    Current Outpatient Rx  . Order #: [id number] Class: Historical Med  . Order #:  [id number] Class: Print  . Order #: [id number] Class: Normal  . Order #: [id number] Class: Historical Med  . Order #: [id number] Class: Historical Med  . Order #: [id number] Class: Print  . Order #: [id number] Class: Normal  . Order #: [id number] Class: Print  . Order #: [id number] Class: Normal  . Order #: [id number] Class: Print  . Order #: [id number] Class: Print  . Order #: [id number] Class: Normal  . Order #: [id number] Class: Print  . Order #: [id number] Class: Normal  . Order #: [id number] Class: Print  . Order #: [id number] Class: Print  . Order #: [id number] Class: Normal  . Order #: [id number] Class: Print    Allergies Benzodiazepines; Paroxetine hcl; Serotonin reuptake inhibitors (ssris); Amlodipine; Diazepam; Doxycycline monohydrate; Escitalopram oxalate; Isosorbide nitrate; Lorazepam; and Tetracyclines & related  Family History  Problem Relation Age of Onset  . Coronary artery disease Brother   . Heart disease Brother 13  . Heart disease Father     MI x 8  . Kidney disease Neg Hx   . Prostate cancer Neg Hx     Social History Social History  Substance Use Topics  . Smoking status: Former Smoker    Packs/day: 3.00    Years: 48.00    Types: Cigarettes, E-cigarettes    Quit date: 06/07/2009  . Smokeless tobacco: Never Used     Comment: Using electronic cigarette  . Alcohol use 3.6 oz/week    6 Cans of beer per week     Comment: Occasional    Review of Systems Constitutional: No fever/chills.No lightheadedness or syncope. Eyes: No visual changes. ENT: No sore throat. No congestion or rhinorrhea. Cardiovascular: Positive chronic and unchanged chest pain. Denies palpitations. Respiratory: Denies shortness of breath.  No cough. Gastrointestinal: Positive mid and lower abdominal pain.  No nausea, no vomiting.  No diarrhea.  Positive constipation. Genitourinary: Negative for dysuria. No scrotal or testicular pain. Musculoskeletal: Negative for back pain. Skin: Negative for rash. Neurological:  Negative for headaches. No focal numbness, tingling or weakness.   10-point ROS otherwise negative.  ____________________________________________   PHYSICAL EXAM:  VITAL SIGNS: ED Triage Vitals  Enc Vitals Group     BP 02/23/16 1616 (!) 165/97     Pulse Rate 02/23/16 1616 82     Resp 02/23/16 1616 18     Temp 02/23/16 1616 98.3 F (36.8 C)     Temp Source 02/23/16 1616 Oral     SpO2 02/23/16 1616 99 %     Weight 02/23/16 1613 189 lb (85.7 kg)     Height 02/23/16 1613 5\' 9"  (1.753 m)     Head Circumference --      Peak Flow --      Pain Score 02/23/16 1613 0     Pain Loc --      Pain Edu? --      Excl. in [place]? --     Constitutional: Alert and oriented. Chronically ill appearing but nontoxic. Answers questions appropriately. Eyes: Conjunctivae are normal.  EOMI. No scleral icterus. Head: Atraumatic. Nose: No congestion/rhinnorhea. Mouth/Throat: Mucous membranes are moist.  Neck: No stridor.  Supple.  No JVD. Cardiovascular: Normal rate, regular rhythm. No murmurs, rubs or gallops.  Respiratory: Normal respiratory effort.  No accessory muscle use or retractions. Lungs CTAB.  No wheezes, rales or ronchi. Gastrointestinal: Overweight, Soft and mildly distended.Tenderness to palpation in the right lower quadrant..  No guarding or rebound.  No peritoneal signs. Musculoskeletal: No LE edema. . Neurologic:  A&Ox3.  Speech is clear.  Face and smile are symmetric.  EOMI.  Moves all extremities well. Skin:  Skin is warm, dry and intact. No rash noted. Psychiatric: Mood and affect are normal. Speech and behavior are normal.  Normal judgement.  ____________________________________________   LABS (all labs ordered are listed, but only abnormal results are displayed)  Labs Reviewed  COMPREHENSIVE METABOLIC PANEL - Abnormal; Notable for the following:       Result Value   Creatinine, Ser 1.73 (*)    GFR calc non Af Amer 39 (*)    GFR calc Af Amer 46 (*)    All other components  within normal limits  URINALYSIS COMPLETEWITH MICROSCOPIC ([facility] ONLY) - Abnormal; Notable for the following:    Color, Urine STRAW (*)    APPearance CLEAR (*)    Squamous Epithelial / LPF 0-5 (*)    All other components within normal limits  LIPASE, BLOOD  CBC  TROPONIN I  TROPONIN I   ____________________________________________  EKG  ED ECG REPORT I, Eula Listen, the attending physician, personally viewed and interpreted this ECG.   Date: 02/23/2016  EKG Time: 1615  Rate: 85  Rhythm: normal sinus rhythm  Axis: normal  Intervals:none  ST&T Change: Nonspecific T-wave inversions in V1.  ____________________________________________  RADIOLOGY  Ct Abdomen Pelvis W Contrast  Result Date: 02/23/2016 CLINICAL DATA:  Abdominal pain for several weeks. No nausea or vomiting. EXAM: CT ABDOMEN AND PELVIS WITH CONTRAST TECHNIQUE: Multidetector CT imaging of the abdomen and pelvis was performed using the standard protocol following bolus administration of intravenous contrast. CONTRAST:  34mL ISOVUE-300 IOPAMIDOL (ISOVUE-300) INJECTION 61% COMPARISON:  CT abdomen pelvis 05/04/2014 FINDINGS: Lower chest: Unchanged 3 mm left basilar pulmonary nodule. No visible pleural or pericardial effusion. Hepatobiliary: Normal hepatic size and contours without focal liver lesion. No perihepatic ascites. No intra- or extrahepatic biliary dilatation. Gallbladder is contracted. Pancreas: Normal pancreatic contours and enhancement. No peripancreatic fluid collection or pancreatic ductal dilatation. Spleen: Normal. Adrenals/Urinary Tract: Normal adrenal glands. Mild right greater than left perinephric stranding, possibly senescent. No hydronephrosis or focal renal mass. Stomach/Bowel: No abnormal bowel dilatation. No bowel wall thickening or adjacent fat stranding to indicate acute inflammation. No abdominal fluid collection. Normal appendix. Vascular/Lymphatic: There is atherosclerotic calcification of  the abdominal aorta. No aneurysm. The portal vein, splenic vein and superior mesenteric vein are patent. No abdominal or pelvic adenopathy. Reproductive: Normal prostate and seminal vesicles. No free fluid in the pelvis. Musculoskeletal: No lytic or blastic osseous lesion. Normal visualized extrathoracic and extraperitoneal soft tissues. Other: No contributory non-categorized findings. IMPRESSION: 1. No acute abnormality of the abdomen or pelvis. 2. Unchanged 3 mm left basilar pulmonary nodule. No follow-up needed if patient is low-risk. Non-contrast chest CT can be considered in 12 months if patient is high-risk. This recommendation follows the consensus statement: Guidelines for Management of Incidental Pulmonary Nodules Detected on CT Images: From the Fleischner Society 2017; Radiology 2017; 284:228-243. Electronically Signed   By: Ulyses Jarred M.D.   On: 02/23/2016 19:33    ____________________________________________   PROCEDURES  Procedure(s) performed: None  Procedures  Critical Care performed: No ____________________________________________   INITIAL IMPRESSION / ASSESSMENT AND PLAN / ED COURSE  Pertinent labs & imaging results that were available during my care of the patient were reviewed by me and considered in my medical decision making (see chart for details).  65 y.o. male with COPD, cardiac risk factors, and GERD presenting with a week of progressively worsening abdominal pain. Overall, the patient has reassuring vital signs without temperature. His EKG does not show ischemic changes, his troponin is negative. However, we will repeat this given his cardiac history but I do not think the cause of the patient's symptoms is ACS or MI. He is no clinical history or physical evidence that would be suggestive of PE. The patient does have some focal right lower quadrant pain on examination. We'll get a CT scan to evaluate for appendicitis or atypical different reticulitis. His laboratory  studies are reassuring with normal LFTs, as well as a normal white blood cell count. The patient is not having any pain at this time. If his workup in the emergency department is reassuring, we'll plan to discharge the patient home with close PMD follow-up.  ----------------------------------------- 8:41 PM on 02/23/2016 -----------------------------------------  The patient is feeling better at this time. His CT scan does not show any acute process. We'll try him on simethicone, see if his symptoms will improve at home. We'll also start him on a daily stool softener and a high-fiber diet. He'll follow-up with his primary care physician. He and his wife understand return precautions as well as follow-up instructions. ____________________________________________  FINAL CLINICAL IMPRESSION(S) / ED DIAGNOSES  Final diagnoses:  Generalized abdominal pain  Constipation, unspecified constipation type  Chest pain, unspecified chest pain type    Clinical Course      NEW MEDICATIONS STARTED DURING THIS VISIT:  New Prescriptions   SENNA-DOCUSATE (SENOKOT-S) 8.6-50 MG TABLET    Take 2 tablets by mouth daily as needed for mild constipation.      Eula Listen, MD 02/23/16 2042

## 2016-02-26 DIAGNOSIS — J449 Chronic obstructive pulmonary disease, unspecified: Secondary | ICD-10-CM | POA: Diagnosis not present

## 2016-03-10 DIAGNOSIS — J432 Centrilobular emphysema: Secondary | ICD-10-CM | POA: Diagnosis not present

## 2016-03-10 DIAGNOSIS — Z720 Tobacco use: Secondary | ICD-10-CM | POA: Diagnosis not present

## 2016-03-10 DIAGNOSIS — Z23 Encounter for immunization: Secondary | ICD-10-CM | POA: Diagnosis not present

## 2016-03-10 DIAGNOSIS — R0609 Other forms of dyspnea: Secondary | ICD-10-CM | POA: Diagnosis not present

## 2016-03-25 ENCOUNTER — Other Ambulatory Visit: Payer: Self-pay | Admitting: Internal Medicine

## 2016-03-27 DIAGNOSIS — J449 Chronic obstructive pulmonary disease, unspecified: Secondary | ICD-10-CM | POA: Diagnosis not present

## 2016-04-15 ENCOUNTER — Telehealth: Payer: Self-pay

## 2016-04-15 NOTE — Telephone Encounter (Signed)
Spoke with pt wife to inform her that pt symbicort was delivered to our office from [place]. Pt wife states pt will come by in the next day or so to pick it up. Box has been left up front. Nothing further needed.

## 2016-05-17 ENCOUNTER — Ambulatory Visit (INDEPENDENT_AMBULATORY_CARE_PROVIDER_SITE_OTHER): Payer: Medicare Other | Admitting: Cardiovascular Disease

## 2016-05-17 ENCOUNTER — Encounter: Payer: Self-pay | Admitting: Cardiovascular Disease

## 2016-05-17 VITALS — BP 160/98 | HR 82 | Ht 69.0 in | Wt 193.2 lb

## 2016-05-17 DIAGNOSIS — I10 Essential (primary) hypertension: Secondary | ICD-10-CM

## 2016-05-17 DIAGNOSIS — I25110 Atherosclerotic heart disease of native coronary artery with unstable angina pectoris: Secondary | ICD-10-CM

## 2016-05-17 DIAGNOSIS — N183 Chronic kidney disease, stage 3 unspecified: Secondary | ICD-10-CM

## 2016-05-17 DIAGNOSIS — E782 Mixed hyperlipidemia: Secondary | ICD-10-CM

## 2016-05-17 DIAGNOSIS — R000 Tachycardia, unspecified: Secondary | ICD-10-CM | POA: Diagnosis not present

## 2016-05-17 DIAGNOSIS — J432 Centrilobular emphysema: Secondary | ICD-10-CM

## 2016-05-17 MED ORDER — AMLODIPINE BESYLATE 10 MG PO TABS: 10.0000 mg | ORAL_TABLET | Freq: Every day | ORAL | 3 refills | Status: DC

## 2016-05-17 NOTE — Patient Instructions (Addendum)
Medication Instructions:   Please start 1/2 pill of the amlodipine daily late morning Stay on the full metoprolol Hydralazine only as needed for high blood pressure >160  Call the office if blood pressure does not imrpove  Labwork:  No new labs needed  Testing/Procedures:  No further testing at this time   I recommend watching educational videos on topics of interest to you at:       [email or web address]  Enter code: [email or web address]    Follow-Up: It was a pleasure seeing you in the office today. Please call us if you have new issues that need to be addressed before your next appt.  [phone]  Your physician wants you to follow-up in: 6 months.  You will receive a reminder letter in the mail two months in advance. If you don't receive a letter, please call our office to schedule the follow-up appointment.  If you need a refill on your cardiac medications before your next appointment, please call your pharmacy.    Cardiac Nuclear Scanning A cardiac nuclear scan is used to check your heart for problems, such as the following:  A portion of the heart is not getting enough blood.  Part of the heart muscle has died, which happens with a heart attack.  The heart wall is not working normally.  In this test, a radioactive dye (tracer) is injected into your bloodstream. After the tracer has traveled to your heart, a scanning device is used to measure how much of the tracer is absorbed by or distributed to various areas of your heart. LET [facility] CARE PROVIDER KNOW ABOUT:  Any allergies you have.  All medicines you are taking, including vitamins, herbs, eye drops, creams, and over-the-counter medicines.  Previous problems you or members of your family have had with the use of anesthetics.  Any blood disorders you have.  Previous surgeries you have had.  Medical conditions you have.  RISKS AND COMPLICATIONS Generally, this is a safe procedure. However, as with any  procedure, problems can occur. Possible problems include:   Serious chest pain.  Rapid heartbeat.  Sensation of warmth in your chest. This usually passes quickly. BEFORE THE PROCEDURE Ask your health care provider about changing or stopping your regular medicines. PROCEDURE This procedure is usually done at a hospital and takes 2-4 hours.  An IV tube is inserted into one of your veins.  Your health care provider will inject a small amount of radioactive tracer through the tube.  You will then wait for 20-40 minutes while the tracer travels through your bloodstream.  You will lie down on an exam table so images of your heart can be taken. Images will be taken for about 15-20 minutes.  You will exercise on a treadmill or stationary bike. While you exercise, your heart activity will be monitored with an electrocardiogram (ECG), and your blood pressure will be checked.  If you are unable to exercise, you may be given a medicine to make your heart beat faster.  When blood flow to your heart has peaked, tracer will again be injected through the IV tube.  After 20-40 minutes, you will get back on the exam table and have more images taken of your heart.  When the procedure is over, your IV tube will be removed. AFTER THE PROCEDURE  You will likely be able to leave shortly after the test. Unless your health care provider tells you otherwise, you may return to your normal schedule, including diet, activities, and medicines.  Make  sure you find out how and when you will get your test results. This information is not intended to replace advice given to you by your health care provider. Make sure you discuss any questions you have with your health care provider. Document Released: 06/18/2004 Document Revised: 05/29/2013 Document Reviewed: 05/02/2013 Elsevier Interactive Patient Education  2017 [employer].

## 2016-05-17 NOTE — Progress Notes (Signed)
Cardiology Office Note  Date:  05/17/2016   ID:  William Mueller, DOB 19-May-1951, MRN [id number]  PCP:  Leonides Sake, MD   Chief Complaint  Patient presents with  . other    69mo f/u. Pt c/o chest pain and states he hasn't been "feeling right" the past 3 or 4 days. Reviewed meds with pt verbally.    HPI:  William Mueller is a very pleasant 65 year old gentleman with a long history of smoking, COPD, chronic renal insufficiency, who presented 12/11/2013 to [facility] with chest pain. He was taken to the cardiac catheterization lab where he was found to have an occluded mid RCA. Stent was placed. Repeat catheterization in October 2016 with patent stent, but did have disease of a small diagonal and distal circumflex not amenable to intervention. He presents today for follow-up of his coronary artery disease And COPD CR 1.7   In follow-up today he reports that sometimes he does not feel well He has finished Long coarse of prednisone Weak recently, Waking up at night, flem in his throat, Trying to burp, having difficulty Coughing stuff up, only a small amount coming up Acid coming up. Takes omeprazole in the morning, has bed frame on a block  Blood pressure elevated in the afternoon, low in the morning Sometimes scared to take his medication He cut his metoprolol in half  In the ER 02/2016 ABD pain. He still does not know the cause of his pain  EMS called to his house 2 weeks ago, HR 147, on couch, did not feel right Sinus tach on EKG he provided to [place] today  Felt he was having a panic attack Talked with EMS and heart rate improved No further episodes since that time  He reports having periodic tachycardia when he wakes up in the morning with associated hypoxemia. Numbers on his pulse oximeter are in the 80s  does not use oxygen at nighttime to sleep  EKG on today's visit shows normal sinus rhythm with rate 82 bpm, no significant ST or T-wave changes  Other past medical  history April 2017 went to the emergency room with chest tightness, kept in the hospital, refused stress test, had cardiac catheterization showing patent stent, disease was relatively unchanged Date of catheterization 09/29/2015 Echocardiogram confirmed ejection fraction 65%  Previously did a PFT, technician reported that he passed out. He does not remember any significant difficulty but the test was abruptly stopped  Unable to tolerate isosorbide secondary to headache previously total cholesterol was 200  he presented to the hospital 02/26/2014 with chest pain. He seen by cardiology, rule out for MI, had normal EKG and discharged home. Echocardiogram July 2015 showed ejection fraction 55-60%  Creatinine in 10/22/2013 was 2.1   PMH:   has a past medical history of Anxiety; Chronic chest pain; Chronic kidney disease (CKD), stage III (moderate); COPD (chronic obstructive pulmonary disease) ([place]); Coronary artery disease; Daily headache; Depression; Diastolic dysfunction; GERD (gastroesophageal reflux disease); High cholesterol; Hypertension; Nicotine addiction; and Sleep apnea.  PSH:    Past Surgical History:  Procedure Laterality Date  . CARDIAC CATHETERIZATION     MC x 1 stent  . CARDIAC CATHETERIZATION Left 03/12/2015   Procedure: Left Heart Cath and Coronary Angiography;  Surgeon: Leonie Man, MD;  Location: [facility];  Service: Cardiovascular;  Laterality: Left;  . CARDIAC CATHETERIZATION N/A 09/29/2015   Procedure: Left Heart Cath and Coronary Angiography;  Surgeon: Wellington Hampshire, MD;  Location: [facility];  Service: Cardiovascular;  Laterality: N/A;  . CORONARY ANGIOPLASTY    . LEFT HEART CATHETERIZATION WITH CORONARY ANGIOGRAM N/A 12/12/2013   Procedure: LEFT HEART CATHETERIZATION WITH CORONARY ANGIOGRAM;  Surgeon: Wellington Hampshire, MD;  Location: [facility];  Service: Cardiovascular;  Laterality: N/A;    Current Outpatient Prescriptions   Medication Sig Dispense Refill  . acetaminophen (TYLENOL) 500 MG tablet Take 1,000 mg by mouth daily as needed for headache.     . albuterol (PROVENTIL) (2.5 MG/3ML) 0.083% nebulizer solution Take 3 mLs (2.5 mg total) by nebulization every 6 (six) hours as needed for wheezing or shortness of breath. 75 mL 12  . ALPRAZolam (XANAX XR) 3 MG 24 hr tablet Take 3 mg by mouth every evening.     Marland Kitchen ALPRAZolam (XANAX) 1 MG tablet Take 1 mg by mouth daily.     Marland Kitchen aspirin 81 MG EC tablet Take 1 tablet (81 mg total) by mouth daily. 90 tablet 3  . atorvastatin (LIPITOR) 80 MG tablet Take 1 tablet (80 mg total) by mouth daily. 90 tablet 3  . budesonide-formoterol (SYMBICORT) 160-4.5 MCG/ACT inhaler Inhale 2 puffs into the lungs 2 (two) times daily. 3 Inhaler 1  . ezetimibe (ZETIA) 10 MG tablet Take 1 tablet (10 mg total) by mouth daily. 90 tablet 3  . hydrALAZINE (APRESOLINE) 25 MG tablet Take 1 tablet (25 mg total) by mouth 3 (three) times daily as needed. (Patient taking differently: Take 25 mg by mouth 3 (three) times daily as needed (DIASTOLIC PRESSURE [id number]). ) 270 tablet 3  . metoprolol succinate (TOPROL-XL) 25 MG 24 hr tablet Take 1 tablet (25 mg total) by mouth daily. 90 tablet 3  . NITROSTAT 0.4 MG SL tablet PLACE 1 TABLET UNDER TONGUE EVERY 5 MINUTES AS NEEDED FOR CHEST PAIN.CALL 911 AFTER 3RD TABLET. 25 tablet 1  . omeprazole (PRILOSEC) 40 MG capsule Take 1 capsule (40 mg total) by mouth daily. 90 capsule 3  . ranolazine (RANEXA) 500 MG 12 hr tablet Take 1 tablet (500 mg total) by mouth 2 (two) times daily. (Patient taking differently: Take 500 mg by mouth daily. ) 60 tablet 6  . roflumilast (DALIRESP) 500 MCG TABS tablet Take 1 tablet (500 mcg total) by mouth daily. 90 tablet 1  . senna-docusate (SENOKOT-S) 8.6-50 MG tablet Take 2 tablets by mouth daily as needed for mild constipation. 30 tablet 0  . ticagrelor (BRILINTA) 60 MG TABS tablet Take 1 tablet (60 mg total) by mouth 2 (two) times daily. 60  tablet 11  . tiotropium (SPIRIVA) 18 MCG inhalation capsule Place 1 capsule (18 mcg total) into inhaler and inhale daily. 90 capsule 1  . VENTOLIN HFA 108 (90 Base) MCG/ACT inhaler INHALE 1 TO 2 PUFFS INTO THE LUNGS EVERY 6 HOURS AS NEEDED FOR WHEEZING OR SHORTNESS OF BREATH. 54 g 0  . amLODipine (NORVASC) 10 MG tablet Take 1 tablet (10 mg total) by mouth daily. 90 tablet 3   No current facility-administered medications for this visit.      Allergies:   Benzodiazepines; Paroxetine hcl; Serotonin reuptake inhibitors (ssris); Diazepam; Doxycycline monohydrate; Escitalopram oxalate; Isosorbide nitrate; Lorazepam; and Tetracyclines & related   Social History:  The patient  reports that he quit smoking about 6 years ago. His smoking use included Cigarettes and E-cigarettes. He has a 144.00 pack-year smoking history. He has never used smokeless tobacco. He reports that he drinks about 3.6 oz of alcohol per week . He reports that he does not use drugs.  Family History:   family history includes Coronary artery disease in his brother; Heart disease in his father; Heart disease (age of onset: 31) in his brother.    Review of Systems: Review of Systems  Constitutional: Negative.   Respiratory: Positive for shortness of breath.   Cardiovascular: Positive for palpitations.  Gastrointestinal: Positive for heartburn.  Musculoskeletal: Negative.   Neurological: Negative.   Psychiatric/Behavioral: Negative.   All other systems reviewed and are negative.    PHYSICAL EXAM: VS:  BP (!) 160/98 (BP Location: Left Arm, Patient Position: Sitting, Cuff Size: Normal)   Pulse 82   Ht 5\' 9"  (1.753 m)   Wt 193 lb 4 oz (87.7 kg)   BMI 28.54 kg/m  , BMI Body mass index is 28.54 kg/m. GEN: Well nourished, well developed, in no acute distress , obese HEENT: normal  Neck: no JVD, carotid bruits, or masses Cardiac: RRR; no murmurs, rubs, or gallops,no edema  Respiratory:  Decreased breath sounds throughout  bilaterally, normal work of breathing GI: soft, nontender, nondistended, + BS MS: no deformity or atrophy  Skin: warm and dry, no rash Neuro:  Strength and sensation are intact Psych: euthymic mood, full affect    Recent Labs: 02/23/2016: ALT 21; BUN 16; Creatinine, Ser 1.73; Hemoglobin 14.9; Platelets 237; Potassium 4.3; Sodium 136    Lipid Panel Lab Results  Component Value Date   CHOL 156 05/28/2015   HDL 54 05/28/2015   LDLCALC 73 05/28/2015   TRIG 146 05/28/2015      Wt Readings from Last 3 Encounters:  05/17/16 193 lb 4 oz (87.7 kg)  02/23/16 189 lb (85.7 kg)  11/19/15 186 lb 4 oz (84.5 kg)       ASSESSMENT AND PLAN:  Essential hypertension - Plan: EKG 12-Lead Blood pressure elevated on today's visit, even on my recheck Recommended he start amlodipine 5 mg daily  Could also takes hydralazine for high blood pressure as needed  Mixed hyperlipidemia Cholesterol is at goal on the current lipid regimen. No changes to the medications were made.  Sinus tachycardia Recent episode of sinus tachycardia, possibly from anxiety Recommended he need nighttime oximetry study If he has further episodes, would order event monitor  Atherosclerosis of native coronary artery of native heart with unstable angina pectoris ([place]) Currently with no symptoms of angina. No further workup at this time. Continue current medication regimen.  CKD (chronic kidney disease) stage 3, GFR 30-59 ml/min Recommended he avoid NSAIDs  Centrilobular emphysema ([facility]) Followed by pulmonary Recommended he talk with pulmonary concerning hypoxia which she may develop overnight while sleeping. Suspect he will need nighttime monitoring for hypoxia   Total encounter time more than 25 minutes  Greater than 50% was spent in counseling and coordination of care with the patient   Disposition:   F/U  6 months   Orders Placed This Encounter  Procedures  . EKG 12-Lead     Signed, Esmond Plants, M.D.,  Ph.D. 05/17/2016  [place], [place]

## 2016-05-21 ENCOUNTER — Telehealth: Payer: Self-pay | Admitting: Pharmacist

## 2016-05-21 NOTE — Telephone Encounter (Signed)
Daliresp PAP refill called  to manufacturer today.

## 2016-06-14 ENCOUNTER — Other Ambulatory Visit: Payer: Self-pay

## 2016-06-14 MED ORDER — EZETIMIBE 10 MG PO TABS: 10.0000 mg | ORAL_TABLET | Freq: Every day | ORAL | 3 refills | Status: DC

## 2016-06-16 ENCOUNTER — Telehealth: Payer: Self-pay | Admitting: Cardiovascular Disease

## 2016-06-16 NOTE — Telephone Encounter (Signed)
Patients wife called in stating that patient is having some increased swelling in his right foot since starting the amlodipine. He is currently taking 1/2 tablet once daily. She also states that he reports that his left foot feels like there is a "sock on it" all the time when he is barefooted. She states his blood pressures have been in the 110's over 70's and most recent today was 115/75 with heart rate of 98. She wanted to check and see if she should have him stop this pill or continue it. Instructed her to have him wear compression socks and when sitting prop feet up on 2 to 3 pillows. Let her know that I would forward to Dr. Rockey Situ for his review and then call her back with his recommendations. She verbalized understanding and had no further questions.

## 2016-06-16 NOTE — Telephone Encounter (Signed)
Spoke with patients wife and reviewed Dr. Donivan Scull recommendations. Instructed her to make sure he continues monitoring his blood pressures and to stop the medication. She verbalized understanding and had no further questions at this time.

## 2016-06-16 NOTE — Telephone Encounter (Signed)
Pt c/o swelling: STAT is pt has developed SOB within 24 hours  1. How long have you been experiencing swelling? Couple week, but getting "tighter"  2. Where is the swelling located? Right foot  3.  Are you currently taking a "fluid pill"? Amlodipine  4.  Are you currently SOB? No  5.  Have you traveled recently?no

## 2016-06-16 NOTE — Telephone Encounter (Signed)
On his last clinic visit I started amlodipine 5 mg for high blood pressure Per the note looks like he is taking 2.5 mg daily? If blood pressure is well controlled on such little amount of medication,  would stop the amlodipine use as needed for high blood pressure Continue to monitor blood pressure without the pill

## 2016-06-18 DIAGNOSIS — R0602 Shortness of breath: Secondary | ICD-10-CM | POA: Diagnosis not present

## 2016-06-18 DIAGNOSIS — R0902 Hypoxemia: Secondary | ICD-10-CM | POA: Diagnosis not present

## 2016-06-18 DIAGNOSIS — G479 Sleep disorder, unspecified: Secondary | ICD-10-CM | POA: Diagnosis not present

## 2016-06-18 DIAGNOSIS — J432 Centrilobular emphysema: Secondary | ICD-10-CM | POA: Diagnosis not present

## 2016-06-18 DIAGNOSIS — R0609 Other forms of dyspnea: Secondary | ICD-10-CM | POA: Diagnosis not present

## 2016-06-18 DIAGNOSIS — R05 Cough: Secondary | ICD-10-CM | POA: Diagnosis not present

## 2016-06-27 DIAGNOSIS — J449 Chronic obstructive pulmonary disease, unspecified: Secondary | ICD-10-CM | POA: Diagnosis not present

## 2016-06-28 ENCOUNTER — Telehealth: Payer: Self-pay | Admitting: Cardiovascular Disease

## 2016-06-28 ENCOUNTER — Other Ambulatory Visit: Payer: Self-pay | Admitting: Cardiovascular Disease

## 2016-06-28 MED ORDER — OMEPRAZOLE 40 MG PO CPDR: 40.0000 mg | DELAYED_RELEASE_CAPSULE | Freq: Every day | ORAL | 1 refills | Status: DC

## 2016-06-28 MED ORDER — METOPROLOL SUCCINATE ER 25 MG PO TB24: 25.0000 mg | ORAL_TABLET | Freq: Every day | ORAL | 1 refills | Status: DC

## 2016-06-28 NOTE — Telephone Encounter (Signed)
Sent refills

## 2016-06-28 NOTE — Telephone Encounter (Signed)
°*  STAT* If patient is at the pharmacy, call can be transferred to refill team.   1. Which medications need to be refilled? (please list name of each medication and dose if known)  Omeprazole 40 mg once a day Metoprolol 25 mg once a day   2. Which pharmacy/location (including street and city if local pharmacy) is medication to be sent to? [facility] in [place]   3. Do they need a 30 day or 90 day supply? 90 day

## 2016-07-01 DIAGNOSIS — I10 Essential (primary) hypertension: Secondary | ICD-10-CM | POA: Diagnosis not present

## 2016-07-01 DIAGNOSIS — E663 Overweight: Secondary | ICD-10-CM | POA: Diagnosis not present

## 2016-07-01 DIAGNOSIS — E782 Mixed hyperlipidemia: Secondary | ICD-10-CM | POA: Diagnosis not present

## 2016-07-01 DIAGNOSIS — F419 Anxiety disorder, unspecified: Secondary | ICD-10-CM | POA: Diagnosis not present

## 2016-07-01 DIAGNOSIS — R202 Paresthesia of skin: Secondary | ICD-10-CM | POA: Diagnosis not present

## 2016-07-01 DIAGNOSIS — N183 Chronic kidney disease, stage 3 (moderate): Secondary | ICD-10-CM | POA: Diagnosis not present

## 2016-07-01 DIAGNOSIS — Z6829 Body mass index (BMI) 29.0-29.9, adult: Secondary | ICD-10-CM | POA: Diagnosis not present

## 2016-07-01 DIAGNOSIS — Z79899 Other long term (current) drug therapy: Secondary | ICD-10-CM | POA: Diagnosis not present

## 2016-07-14 ENCOUNTER — Ambulatory Visit: Payer: PPO | Admitting: Urology

## 2016-07-16 DIAGNOSIS — D229 Melanocytic nevi, unspecified: Secondary | ICD-10-CM | POA: Diagnosis not present

## 2016-07-16 DIAGNOSIS — D692 Other nonthrombocytopenic purpura: Secondary | ICD-10-CM | POA: Diagnosis not present

## 2016-07-16 DIAGNOSIS — L720 Epidermal cyst: Secondary | ICD-10-CM | POA: Diagnosis not present

## 2016-07-16 DIAGNOSIS — D1800 Hemangioma unspecified site: Secondary | ICD-10-CM | POA: Diagnosis not present

## 2016-07-16 DIAGNOSIS — L578 Other skin changes due to chronic exposure to nonionizing radiation: Secondary | ICD-10-CM | POA: Diagnosis not present

## 2016-07-16 DIAGNOSIS — L570 Actinic keratosis: Secondary | ICD-10-CM | POA: Diagnosis not present

## 2016-07-16 DIAGNOSIS — D2339 Other benign neoplasm of skin of other parts of face: Secondary | ICD-10-CM | POA: Diagnosis not present

## 2016-07-19 ENCOUNTER — Telehealth: Payer: Self-pay | Admitting: Cardiovascular Disease

## 2016-07-19 ENCOUNTER — Other Ambulatory Visit: Payer: Self-pay | Admitting: Cardiovascular Disease

## 2016-07-19 MED ORDER — HYDRALAZINE HCL 25 MG PO TABS: 25.0000 mg | ORAL_TABLET | Freq: Three times a day (TID) | ORAL | 3 refills | Status: DC | PRN

## 2016-07-19 NOTE — Telephone Encounter (Signed)
Refill sent. Notified pt's wife.

## 2016-07-19 NOTE — Telephone Encounter (Signed)
°*  STAT* If patient is at the pharmacy, call can be transferred to refill team.   1. Which medications need to be refilled? (please list name of each medication and dose if known) Apresoline 25 mg   2. Which pharmacy/location (including street and city if local pharmacy) is medication to be sent to? [facility] in [place]   3. Do they need a 30 day or 90 day supply? 90 day

## 2016-07-26 ENCOUNTER — Encounter: Payer: Self-pay | Admitting: Intensive Care

## 2016-07-26 ENCOUNTER — Observation Stay
Admission: EM | Admit: 2016-07-26 | Discharge: 2016-07-27 | Disposition: A | Discharge status: Home / Self Care | Payer: PPO | Attending: Internal Medicine | Admitting: Internal Medicine

## 2016-07-26 ENCOUNTER — Emergency Department: Payer: PPO

## 2016-07-26 DIAGNOSIS — Z955 Presence of coronary angioplasty implant and graft: Secondary | ICD-10-CM | POA: Insufficient documentation

## 2016-07-26 DIAGNOSIS — I10 Essential (primary) hypertension: Secondary | ICD-10-CM | POA: Diagnosis present

## 2016-07-26 DIAGNOSIS — R0789 Other chest pain: Principal | ICD-10-CM | POA: Insufficient documentation

## 2016-07-26 DIAGNOSIS — I130 Hypertensive heart and chronic kidney disease with heart failure and stage 1 through stage 4 chronic kidney disease, or unspecified chronic kidney disease: Secondary | ICD-10-CM | POA: Insufficient documentation

## 2016-07-26 DIAGNOSIS — Z79899 Other long term (current) drug therapy: Secondary | ICD-10-CM | POA: Insufficient documentation

## 2016-07-26 DIAGNOSIS — F419 Anxiety disorder, unspecified: Secondary | ICD-10-CM | POA: Diagnosis not present

## 2016-07-26 DIAGNOSIS — Z888 Allergy status to other drugs, medicaments and biological substances status: Secondary | ICD-10-CM | POA: Insufficient documentation

## 2016-07-26 DIAGNOSIS — F17210 Nicotine dependence, cigarettes, uncomplicated: Secondary | ICD-10-CM | POA: Insufficient documentation

## 2016-07-26 DIAGNOSIS — N183 Chronic kidney disease, stage 3 unspecified: Secondary | ICD-10-CM | POA: Diagnosis present

## 2016-07-26 DIAGNOSIS — I509 Heart failure, unspecified: Secondary | ICD-10-CM | POA: Insufficient documentation

## 2016-07-26 DIAGNOSIS — G4730 Sleep apnea, unspecified: Secondary | ICD-10-CM | POA: Insufficient documentation

## 2016-07-26 DIAGNOSIS — I2510 Atherosclerotic heart disease of native coronary artery without angina pectoris: Secondary | ICD-10-CM

## 2016-07-26 DIAGNOSIS — Z881 Allergy status to other antibiotic agents status: Secondary | ICD-10-CM | POA: Insufficient documentation

## 2016-07-26 DIAGNOSIS — Z9861 Coronary angioplasty status: Secondary | ICD-10-CM

## 2016-07-26 DIAGNOSIS — F329 Major depressive disorder, single episode, unspecified: Secondary | ICD-10-CM | POA: Diagnosis not present

## 2016-07-26 DIAGNOSIS — J449 Chronic obstructive pulmonary disease, unspecified: Secondary | ICD-10-CM | POA: Diagnosis not present

## 2016-07-26 DIAGNOSIS — R51 Headache: Secondary | ICD-10-CM | POA: Insufficient documentation

## 2016-07-26 DIAGNOSIS — E7800 Pure hypercholesterolemia, unspecified: Secondary | ICD-10-CM | POA: Insufficient documentation

## 2016-07-26 DIAGNOSIS — Z8249 Family history of ischemic heart disease and other diseases of the circulatory system: Secondary | ICD-10-CM | POA: Insufficient documentation

## 2016-07-26 DIAGNOSIS — I252 Old myocardial infarction: Secondary | ICD-10-CM | POA: Insufficient documentation

## 2016-07-26 DIAGNOSIS — R079 Chest pain, unspecified: Secondary | ICD-10-CM | POA: Diagnosis not present

## 2016-07-26 DIAGNOSIS — Z7982 Long term (current) use of aspirin: Secondary | ICD-10-CM | POA: Diagnosis not present

## 2016-07-26 DIAGNOSIS — G8929 Other chronic pain: Secondary | ICD-10-CM | POA: Insufficient documentation

## 2016-07-26 DIAGNOSIS — I471 Supraventricular tachycardia: Secondary | ICD-10-CM | POA: Insufficient documentation

## 2016-07-26 DIAGNOSIS — K219 Gastro-esophageal reflux disease without esophagitis: Secondary | ICD-10-CM | POA: Diagnosis not present

## 2016-07-26 DIAGNOSIS — I25110 Atherosclerotic heart disease of native coronary artery with unstable angina pectoris: Secondary | ICD-10-CM | POA: Diagnosis not present

## 2016-07-26 LAB — CBC
HEMATOCRIT: 42.3 % (ref 40.0–52.0)
HEMOGLOBIN: 14.9 g/dL (ref 13.0–18.0)
MCH: 30.7 pg (ref 26.0–34.0)
MCHC: 35.2 g/dL (ref 32.0–36.0)
MCV: 87.3 fL (ref 80.0–100.0)
Platelets: 261 10*3/uL (ref 150–440)
RBC: 4.85 MIL/uL (ref 4.40–5.90)
RDW: 13.2 % (ref 11.5–14.5)
WBC: 8.6 10*3/uL (ref 3.8–10.6)

## 2016-07-26 LAB — BASIC METABOLIC PANEL
ANION GAP: 7 (ref 5–15)
BUN: 15 mg/dL (ref 6–20)
CALCIUM: 9.1 mg/dL (ref 8.9–10.3)
CHLORIDE: 103 mmol/L (ref 101–111)
CO2: 28 mmol/L (ref 22–32)
Creatinine, Ser: 1.84 mg/dL — ABNORMAL HIGH (ref 0.61–1.24)
GFR calc Af Amer: 42 mL/min — ABNORMAL LOW (ref >60)
GFR calc non Af Amer: 36 mL/min — ABNORMAL LOW (ref >60)
Glucose, Bld: 101 mg/dL — ABNORMAL HIGH (ref 65–99)
POTASSIUM: 4.2 mmol/L (ref 3.5–5.1)
Sodium: 138 mmol/L (ref 135–145)

## 2016-07-26 LAB — TROPONIN I: Troponin I: <0.03 ng/mL (ref <0.03)

## 2016-07-26 NOTE — ED Notes (Signed)
Patient transported to X-ray 

## 2016-07-26 NOTE — ED Triage Notes (Signed)
PAtient presents to ER with c/o high blood pressure and left sided Chest pain that radiates to L hand off and on X3 days. Patient reports having MI in 2015 and had a heart cath completed X1 year ago that showed two blockages that are not blocked enough for a stent. A&O x3

## 2016-07-27 DIAGNOSIS — I10 Essential (primary) hypertension: Secondary | ICD-10-CM | POA: Diagnosis not present

## 2016-07-27 DIAGNOSIS — R0789 Other chest pain: Secondary | ICD-10-CM | POA: Diagnosis not present

## 2016-07-27 DIAGNOSIS — N183 Chronic kidney disease, stage 3 (moderate): Secondary | ICD-10-CM

## 2016-07-27 DIAGNOSIS — I2510 Atherosclerotic heart disease of native coronary artery without angina pectoris: Secondary | ICD-10-CM

## 2016-07-27 DIAGNOSIS — Z9861 Coronary angioplasty status: Secondary | ICD-10-CM

## 2016-07-27 LAB — BASIC METABOLIC PANEL
ANION GAP: 7 (ref 5–15)
BUN: 14 mg/dL (ref 6–20)
CHLORIDE: 105 mmol/L (ref 101–111)
CO2: 27 mmol/L (ref 22–32)
Calcium: 8.9 mg/dL (ref 8.9–10.3)
Creatinine, Ser: 1.73 mg/dL — ABNORMAL HIGH (ref 0.61–1.24)
GFR calc Af Amer: 45 mL/min — ABNORMAL LOW (ref >60)
GFR, EST NON AFRICAN AMERICAN: 39 mL/min — AB (ref >60)
Glucose, Bld: 100 mg/dL — ABNORMAL HIGH (ref 65–99)
POTASSIUM: 3.9 mmol/L (ref 3.5–5.1)
SODIUM: 139 mmol/L (ref 135–145)

## 2016-07-27 LAB — LIPID PANEL
CHOL/HDL RATIO: 2.7 ratio
CHOLESTEROL: 106 mg/dL (ref 0–200)
HDL: 40 mg/dL — AB (ref >40)
LDL Cholesterol: 42 mg/dL (ref 0–99)
Triglycerides: 118 mg/dL (ref <150)
VLDL: 24 mg/dL (ref 0–40)

## 2016-07-27 LAB — TROPONIN I
Troponin I: <0.03 ng/mL (ref <0.03)
Troponin I: <0.03 ng/mL (ref <0.03)

## 2016-07-27 MED ORDER — ENOXAPARIN SODIUM 40 MG/0.4ML ~~LOC~~ SOLN
40.0000 mg | SUBCUTANEOUS | Status: DC
  Administered 2016-07-27: 40 mg via SUBCUTANEOUS
  Filled 2016-07-27: qty 0.4

## 2016-07-27 MED ORDER — ACETAMINOPHEN 325 MG PO TABS: 650.0000 mg | ORAL_TABLET | ORAL | Status: DC | PRN

## 2016-07-27 MED ORDER — SODIUM CHLORIDE 0.9% FLUSH
3.0000 mL | Freq: Two times a day (BID) | INTRAVENOUS | Status: DC
  Administered 2016-07-27 (×2): 3 mL via INTRAVENOUS

## 2016-07-27 MED ORDER — ONDANSETRON HCL 4 MG/2ML IJ SOLN
INTRAMUSCULAR | Status: AC
  Filled 2016-07-27: qty 2

## 2016-07-27 MED ORDER — EZETIMIBE 10 MG PO TABS
10.0000 mg | ORAL_TABLET | Freq: Every day | ORAL | Status: DC
  Filled 2016-07-27: qty 1

## 2016-07-27 MED ORDER — NITROGLYCERIN 0.4 MG SL SUBL
0.4000 mg | SUBLINGUAL_TABLET | SUBLINGUAL | Status: DC | PRN
  Administered 2016-07-27: 0.4 mg via SUBLINGUAL

## 2016-07-27 MED ORDER — SODIUM CHLORIDE 0.9 % IV SOLN: 250.0000 mL | INTRAVENOUS | Status: DC | PRN

## 2016-07-27 MED ORDER — ALBUTEROL SULFATE (2.5 MG/3ML) 0.083% IN NEBU: 2.5000 mg | INHALATION_SOLUTION | Freq: Four times a day (QID) | RESPIRATORY_TRACT | Status: DC | PRN

## 2016-07-27 MED ORDER — METOPROLOL SUCCINATE ER 50 MG PO TB24: 50.0000 mg | ORAL_TABLET | Freq: Every day | ORAL | Status: DC

## 2016-07-27 MED ORDER — METOPROLOL SUCCINATE ER 25 MG PO TB24
25.0000 mg | ORAL_TABLET | Freq: Every day | ORAL | Status: DC
  Administered 2016-07-27: 25 mg via ORAL
  Filled 2016-07-27: qty 1

## 2016-07-27 MED ORDER — ONDANSETRON HCL 4 MG/2ML IJ SOLN
4.0000 mg | Freq: Once | INTRAMUSCULAR | Status: AC
  Administered 2016-07-27: 4 mg via INTRAVENOUS

## 2016-07-27 MED ORDER — TIOTROPIUM BROMIDE MONOHYDRATE 18 MCG IN CAPS
18.0000 ug | ORAL_CAPSULE | Freq: Every day | RESPIRATORY_TRACT | Status: DC
  Administered 2016-07-27: 18 ug via RESPIRATORY_TRACT
  Filled 2016-07-27: qty 5

## 2016-07-27 MED ORDER — MORPHINE SULFATE (PF) 2 MG/ML IV SOLN
INTRAVENOUS | Status: AC
  Filled 2016-07-27: qty 1

## 2016-07-27 MED ORDER — AMLODIPINE BESYLATE 10 MG PO TABS
10.0000 mg | ORAL_TABLET | Freq: Every morning | ORAL | Status: DC
  Administered 2016-07-27: 10 mg via ORAL
  Filled 2016-07-27: qty 1

## 2016-07-27 MED ORDER — METOPROLOL SUCCINATE ER 50 MG PO TB24: 50.0000 mg | ORAL_TABLET | Freq: Every day | ORAL | 0 refills | Status: DC

## 2016-07-27 MED ORDER — ONDANSETRON HCL 4 MG/2ML IJ SOLN: 4.0000 mg | Freq: Four times a day (QID) | INTRAMUSCULAR | Status: DC | PRN

## 2016-07-27 MED ORDER — AMLODIPINE BESYLATE 10 MG PO TABS: ORAL_TABLET | ORAL | 0 refills | Status: DC

## 2016-07-27 MED ORDER — ATORVASTATIN CALCIUM 20 MG PO TABS
80.0000 mg | ORAL_TABLET | Freq: Every day | ORAL | Status: DC
  Filled 2016-07-27: qty 4

## 2016-07-27 MED ORDER — ROFLUMILAST 500 MCG PO TABS
500.0000 ug | ORAL_TABLET | Freq: Every day | ORAL | Status: DC
  Administered 2016-07-27: 500 ug via ORAL
  Filled 2016-07-27: qty 1

## 2016-07-27 MED ORDER — ASPIRIN EC 81 MG PO TBEC: 81.0000 mg | DELAYED_RELEASE_TABLET | Freq: Every day | ORAL | Status: DC

## 2016-07-27 MED ORDER — MOMETASONE FURO-FORMOTEROL FUM 200-5 MCG/ACT IN AERO
2.0000 | INHALATION_SPRAY | Freq: Two times a day (BID) | RESPIRATORY_TRACT | Status: DC
  Administered 2016-07-27: 2 via RESPIRATORY_TRACT
  Filled 2016-07-27: qty 8.8

## 2016-07-27 MED ORDER — RANOLAZINE ER 500 MG PO TB12: 500.0000 mg | ORAL_TABLET | Freq: Every day | ORAL | Status: DC

## 2016-07-27 MED ORDER — NITROGLYCERIN 0.4 MG SL SUBL: 0.4000 mg | SUBLINGUAL_TABLET | SUBLINGUAL | Status: DC | PRN

## 2016-07-27 MED ORDER — MORPHINE SULFATE (PF) 2 MG/ML IV SOLN
2.0000 mg | Freq: Once | INTRAVENOUS | Status: AC
  Administered 2016-07-27: 2 mg via INTRAVENOUS

## 2016-07-27 MED ORDER — ASPIRIN 300 MG RE SUPP: 300.0000 mg | RECTAL | Status: AC

## 2016-07-27 MED ORDER — ASPIRIN 81 MG PO CHEW
324.0000 mg | CHEWABLE_TABLET | ORAL | Status: AC
  Administered 2016-07-27: 324 mg via ORAL
  Filled 2016-07-27: qty 4

## 2016-07-27 MED ORDER — MORPHINE SULFATE (PF) 2 MG/ML IV SOLN
2.0000 mg | INTRAVENOUS | Status: DC | PRN
Start: 2016-07-27 — End: 2016-07-27

## 2016-07-27 MED ORDER — ASPIRIN 81 MG PO TBEC: 81.0000 mg | DELAYED_RELEASE_TABLET | Freq: Every day | ORAL | Status: DC

## 2016-07-27 MED ORDER — RANOLAZINE ER 500 MG PO TB12
500.0000 mg | ORAL_TABLET | Freq: Two times a day (BID) | ORAL | Status: DC
  Administered 2016-07-27: 500 mg via ORAL
  Filled 2016-07-27 (×2): qty 1

## 2016-07-27 MED ORDER — PANTOPRAZOLE SODIUM 40 MG PO TBEC
40.0000 mg | DELAYED_RELEASE_TABLET | Freq: Every day | ORAL | Status: DC
  Administered 2016-07-27: 40 mg via ORAL
  Filled 2016-07-27: qty 1

## 2016-07-27 MED ORDER — SODIUM CHLORIDE 0.9% FLUSH: 3.0000 mL | INTRAVENOUS | Status: DC | PRN

## 2016-07-27 MED ORDER — NITROGLYCERIN 0.4 MG SL SUBL
SUBLINGUAL_TABLET | SUBLINGUAL | Status: AC
  Administered 2016-07-27: 0.4 mg via SUBLINGUAL
  Filled 2016-07-27: qty 1

## 2016-07-27 MED ORDER — ALBUTEROL SULFATE (2.5 MG/3ML) 0.083% IN NEBU: 3.0000 mL | INHALATION_SOLUTION | RESPIRATORY_TRACT | Status: DC | PRN

## 2016-07-27 MED ORDER — TICAGRELOR 60 MG PO TABS
60.0000 mg | ORAL_TABLET | Freq: Two times a day (BID) | ORAL | Status: DC
  Administered 2016-07-27: 60 mg via ORAL
  Filled 2016-07-27 (×3): qty 1

## 2016-07-27 MED ORDER — SENNOSIDES-DOCUSATE SODIUM 8.6-50 MG PO TABS: 2.0000 | ORAL_TABLET | Freq: Every day | ORAL | Status: DC | PRN

## 2016-07-27 NOTE — Consult Note (Signed)
Cardiology Consultation Note  Patient ID: William Mueller, MRN: [id number], DOB/AGE: September 09, 1950 66 y.o. Admit date: 07/26/2016   Date of Consult: 07/27/2016 Primary Physician: Leonides Sake, MD Primary Cardiologist: Dr. Rockey Situ, MD Requesting Physician: Dr. Estanislado Pandy, MD  Chief Complaint: Elevated blood pressure Reason for Consult: Chest pain  HPI: 66 y.o. male with h/o CAD with occluded mid RCA s/p remote PCI/DES, HTN, long standing history of tobacco abuse quitting approximately 7 years ago, COPD, CKD stage III, rectal dysfunction, GERD, anxiety on Xanax, daily headache, and history of chronic chest pain presented to [facility] on 2/19 with elevated BP. Cardiology asked to see for chest pain.  Admitted to [facility] in 12/2013 with chest pain. Underwent LHC on /01/2014 that showed left main normal, mid LAD diffuse 20%, ostial D1 50%, prox D1 50%, D2 30%, mid LCx moderately calcified with 30%, RCA mildly calcified with diffuse 30% proximally. The vessel was occluded in the midsegment with TIMI 1 flow. Faint left to right collaterals were noted. He underwent successful PCI/DES to the RCA with TIMI 3 flow. Echo showed EF 55-60%, mild LVH, GR1DD, inferior HK, elevated CVP, dilated IVC suggestive of elevated RA pressure. Admitted again in 02/2014 with chest pain, ruled out, left AMA. Admitted 02/2015 with chest pain. Ruled out. Underwent LHC that showed patent RCA stent with proximal RCA 55%, d1 80%, mid LCx to distal LCx 90%. Admitted 09/2015 with chest pain, ruled out, LHC showed patent RCA stent with unchanged nonobstructive disease when compared to prior LHC. FFR of RCA lesion not significant at 0.93. Medical management was advised.  Over the past 3 days patient has noted increased BP from his baseline into the [id number] systolic. Occasional, nonexertional chest pain. No associated SOB, diaphoresis, nausea, vomiting, dizziness, presyncope, syncope, or palpitations. Because his BP continued to run high he presented to the  hospital.   Upon the patient's arrival to [facility] they were found to have SBP 158 mmHg. Ruled out. SCr 1.84-->1.73, K+ 4.3, unremarkable CBC. ECG as below, CXR showed COPD. Currently without chest pain.   Past Medical History:  Diagnosis Date  . Anxiety   . Chronic chest pain   . Chronic kidney disease (CKD), stage III (moderate)   . COPD (chronic obstructive pulmonary disease) ([place])   . Coronary artery disease    a. 12/2013 PCI: mRCA 100% with L to R collats s/p PCI/DES;  b. 06/2014 MV: no ischemia/infarct, EF 53%;  c. 03/2015 Cath: LM nl, LAD nl, D1 80 (1.39mm), LCX 44m (<1.64mm), OM1 nl, RCA 55p, patent stent, RPDA nl, EF 65%; d. 09/2015 Cath: LM nl, LAD 30m, D1 80 (<53mm), LCX 58m/d (<1.74mm), OM1 nl, RCA 55p (FFR 0.93), patent stent, RPDA nl-->Med Rx.  . Daily headache   . Depression   . Diastolic dysfunction    a. echo 12/2013: EF 55-60%, mild LVH, GR1DD, inf HK, elevated CVP, mildly dilated IVC suggestive of increased RA pressure  . GERD (gastroesophageal reflux disease)   . High cholesterol   . Hypertension   . Nicotine addiction    a. using eCigs.  . Sleep apnea       Most Recent Cardiac Studies: LHC 09/2015: Conclusion    Mid Cx to Dist Cx lesion, 90% stenosed.  1st Diag lesion, 80% stenosed.  Prox RCA lesion, 55% stenosed. The lesion was not previously treated.  Prox LAD to Mid LAD lesion, 40% stenosed.   1. Widely patent mid RCA stent. Stable proximal moderate RCA stenosis not significant by pressure wire interrogation.  Significant disease affecting a small first diagonal and the AV groove branch of the left circumflex. Both of these are unchanged from before. There is mild to moderate calcified proximal to mid LAD disease which is also unchanged.  2. Mild to moderate elevation of left ventricular end-diastolic pressure.  Recommendations: Continue aggressive medical therapy. Can discharge home later today after 6 hours of hydration. Only 45 mL of contrast was used.       Surgical History:  Past Surgical History:  Procedure Laterality Date  . CARDIAC CATHETERIZATION     MC x 1 stent  . CARDIAC CATHETERIZATION Left 03/12/2015   Procedure: Left Heart Cath and Coronary Angiography;  Surgeon: Leonie Man, MD;  Location: [place] [facility];  Service: Cardiovascular;  Laterality: Left;  . CARDIAC CATHETERIZATION N/A 09/29/2015   Procedure: Left Heart Cath and Coronary Angiography;  Surgeon: Wellington Hampshire, MD;  Location: [place] [facility];  Service: Cardiovascular;  Laterality: N/A;  . CORONARY ANGIOPLASTY    . LEFT HEART CATHETERIZATION WITH CORONARY ANGIOGRAM N/A 12/12/2013   Procedure: LEFT HEART CATHETERIZATION WITH CORONARY ANGIOGRAM;  Surgeon: Wellington Hampshire, MD;  Location: [place] [facility];  Service: Cardiovascular;  Laterality: N/A;     Home Meds: Prior to Admission medications   Medication Sig Start Date End Date Taking? Authorizing Provider  acetaminophen (TYLENOL) 500 MG tablet Take 1,000 mg by mouth daily as needed for headache.     Historical Provider, MD  albuterol (PROVENTIL) (2.5 MG/3ML) 0.083% nebulizer solution Take 3 mLs (2.5 mg total) by nebulization every 6 (six) hours as needed for wheezing or shortness of breath. 09/26/15   Minna Merritts, MD  ALPRAZolam (XANAX XR) 3 MG 24 hr tablet Take 3 mg by mouth every evening.     Historical Provider, MD  ALPRAZolam Duanne Moron) 1 MG tablet Take 1 mg by mouth daily.  11/21/13   Historical Provider, MD  amLODipine (NORVASC) 10 MG tablet Take daily at 11 am 07/27/16   Loletha Grayer, MD  aspirin 81 MG EC tablet Take 1 tablet (81 mg total) by mouth daily. 09/04/15   Minna Merritts, MD  atorvastatin (LIPITOR) 80 MG tablet Take 1 tablet (80 mg total) by mouth daily. 11/19/15   Minna Merritts, MD  budesonide-formoterol (SYMBICORT) 160-4.5 MCG/ACT inhaler Inhale 2 puffs into the lungs 2 (two) times daily. 09/04/15   Laverle Hobby, MD  ezetimibe (ZETIA) 10 MG tablet Take 1 tablet (10 mg total) by  mouth daily. 06/14/16   Minna Merritts, MD  hydrALAZINE (APRESOLINE) 25 MG tablet Take 1 tablet (25 mg total) by mouth 3 (three) times daily as needed. 07/19/16   Minna Merritts, MD  metoprolol succinate (TOPROL-XL) 25 MG 24 hr tablet Take 1 tablet (25 mg total) by mouth daily. 06/28/16   Minna Merritts, MD  NITROSTAT 0.4 MG SL tablet PLACE 1 TABLET UNDER TONGUE EVERY 5 MINUTES AS NEEDED FOR CHEST PAIN.CALL 911 AFTER 3RD TABLET. 11/14/15   Minna Merritts, MD  omeprazole (PRILOSEC) 40 MG capsule Take 1 capsule (40 mg total) by mouth daily. 06/28/16   Minna Merritts, MD  ranolazine (RANEXA) 500 MG 12 hr tablet Take 1 tablet (500 mg total) by mouth daily. 07/27/16   Loletha Grayer, MD  roflumilast (DALIRESP) 500 MCG TABS tablet Take 1 tablet (500 mcg total) by mouth daily. 09/04/15   Laverle Hobby, MD  senna-docusate (SENOKOT-S) 8.6-50 MG tablet Take 2 tablets by mouth daily as needed for mild  constipation. 02/23/16 02/22/17  Eula Listen, MD  ticagrelor (BRILINTA) 60 MG TABS tablet Take 1 tablet (60 mg total) by mouth 2 (two) times daily. 12/16/14   Minna Merritts, MD  tiotropium (SPIRIVA) 18 MCG inhalation capsule Place 1 capsule (18 mcg total) into inhaler and inhale daily. 09/04/15   Laverle Hobby, MD  VENTOLIN HFA 108 (90 Base) MCG/ACT inhaler INHALE 1 TO 2 PUFFS INTO THE LUNGS EVERY 6 HOURS AS NEEDED FOR WHEEZING OR SHORTNESS OF BREATH. 03/25/16   Laverle Hobby, MD    Inpatient Medications:  . amLODipine  10 mg Oral q morning - 10a  . [START ON 07/28/2016] aspirin EC  81 mg Oral Daily  . atorvastatin  80 mg Oral Daily  . enoxaparin (LOVENOX) injection  40 mg Subcutaneous Q24H  . ezetimibe  10 mg Oral Daily  . metoprolol succinate  25 mg Oral Daily  . mometasone-formoterol  2 puff Inhalation BID  . pantoprazole  40 mg Oral Daily  . ranolazine  500 mg Oral BID  . roflumilast  500 mcg Oral Daily  . sodium chloride flush  3 mL Intravenous Q12H  . ticagrelor  60 mg  Oral BID  . tiotropium  18 mcg Inhalation Daily     Allergies:  Allergies  Allergen Reactions  . Benzodiazepines Shortness Of Breath    Patient is on Xanax  . Paroxetine Hcl Shortness Of Breath and Palpitations  . Serotonin Reuptake Inhibitors (Ssris) Shortness Of Breath and Palpitations  . Diazepam Other (See Comments)    Rapid heartrate  . Doxycycline Monohydrate Other (See Comments)    Unknown allergic reaction  . Escitalopram Oxalate Other (See Comments)    serotonin Syndrome  . Isosorbide Nitrate Other (See Comments)    Feels like head was going to explode -- HA  . Lorazepam Other (See Comments)    Unknown allergic reaction  . Tetracyclines & Related Itching and Rash    Social History   Social History  . Marital status: Married    Spouse name: N/A  . Number of children: N/A  . Years of education: N/A   Occupational History  . Not on file.   Social History Main Topics  . Smoking status: Current Every Day Smoker    Years: 48.00    Types: E-cigarettes    Last attempt to quit: 06/07/2009  . Smokeless tobacco: Never Used     Comment: Using electronic cigarette  . Alcohol use 3.6 oz/week    6 Cans of beer per week     Comment: Occasional  . Drug use: No  . Sexual activity: Not Currently   Other Topics Concern  . Not on file   Social History Narrative  . No narrative on file     Family History  Problem Relation Age of Onset  . Coronary artery disease Brother   . Heart disease Brother 85  . Heart disease Father     MI x 8  . Kidney disease Neg Hx   . Prostate cancer Neg Hx      Review of Systems: Review of Systems  Constitutional: Positive for malaise/fatigue. Negative for chills, diaphoresis, fever and weight loss.  HENT: Negative for congestion.   Eyes: Negative for discharge and redness.  Respiratory: Negative for cough, hemoptysis, sputum production, shortness of breath and wheezing.   Cardiovascular: Positive for chest pain. Negative for  palpitations, orthopnea, claudication, leg swelling and PND.  Gastrointestinal: Negative for abdominal pain, blood in stool, heartburn, melena, nausea and  vomiting.  Genitourinary: Negative for hematuria.  Musculoskeletal: Negative for falls and myalgias.  Skin: Negative for rash.  Neurological: Positive for headaches. Negative for dizziness, tingling, tremors, sensory change, speech change, focal weakness, loss of consciousness and weakness.  Endo/Heme/Allergies: Does not bruise/bleed easily.  Psychiatric/Behavioral: Negative for substance abuse. The patient is not nervous/anxious.   All other systems reviewed and are negative.   Labs:  Recent Labs  07/26/16 2149 07/27/16 0359 07/27/16 0955  TROPONINI <0.03 <0.03 <0.03   Lab Results  Component Value Date   WBC 8.6 07/26/2016   HGB 14.9 07/26/2016   HCT 42.3 07/26/2016   MCV 87.3 07/26/2016   PLT 261 07/26/2016     Recent Labs Lab 07/27/16 0359  NA 139  K 3.9  CL 105  CO2 27  BUN 14  CREATININE 1.73*  CALCIUM 8.9  GLUCOSE 100*   Lab Results  Component Value Date   CHOL 106 07/27/2016   HDL 40 (L) 07/27/2016   LDLCALC 42 07/27/2016   TRIG 118 07/27/2016   No results found for: DDIMER  Radiology/Studies:  Dg Chest 2 View  Result Date: 07/26/2016 CLINICAL DATA:  66 y/o  M; chest pain. EXAM: CHEST  2 VIEW COMPARISON:  09/27/2015 chest radiograph. FINDINGS: Stable heart size and mediastinal contours are within normal limits. Hyperinflated lungs with flattened diaphragms compatible with COPD. Both lungs are clear. Mild degenerative changes of the thoracic spine. IMPRESSION: COPD.  No active cardiopulmonary disease. Electronically Signed   By: Kristine Garbe M.D.   On: 07/26/2016 22:27    EKG: Interpreted by me showed: NSR, 95 bpm, nonspecific st/t changes aVF Telemetry: Interpreted by me showed: sinus rhythm  Weights: Filed Weights   07/27/16 0345  Weight: 191 lb 14.4 oz (87 kg)     Physical  Exam: Blood pressure 118/60, pulse 80, temperature 98.1 F (36.7 C), temperature source Oral, resp. rate 20, height 5\' 9"  (1.753 m), weight 191 lb 14.4 oz (87 kg), SpO2 97 %. Body mass index is 28.34 kg/m. General: Well developed, well nourished, in no acute distress. Head: Normocephalic, atraumatic, sclera non-icteric, no xanthomas, nares are without discharge.  Neck: Negative for carotid bruits. JVD not elevated. Lungs: Clear bilaterally to auscultation without wheezes, rales, or rhonchi. Breathing is unlabored. Heart: RRR with S1 S2. No murmurs, rubs, or gallops appreciated. Abdomen: Soft, non-tender, non-distended with normoactive bowel sounds. No hepatomegaly. No rebound/guarding. No obvious abdominal masses. Msk:  Strength and tone appear normal for age. Extremities: No clubbing or cyanosis. No edema. Distal pedal pulses are 2+ and equal bilaterally. Neuro: Alert and oriented X 3. No facial asymmetry. No focal deficit. Moves all extremities spontaneously. Psych:  Responds to questions appropriately with a normal affect.    Assessment and Plan:  Principal Problem:   Atypical chest pain Active Problems:   Essential hypertension   COPD (chronic obstructive pulmonary disease) ([facility])   CKD (chronic kidney disease) stage 3, GFR 30-59 ml/min   Chronic chest pain   CAD S/P PCI DES to RCA    1. Atypical chest pain: -patient presented to hospital with concerns regarding his elevated blood pressure -Has noted minimal chest pain at rest -has chronic chest pain -Symptoms appear mostly atypical, though he does have real disease -Would titrate antianginal medication as needed starting with beta blocker -No plans for inpatient ischemic evaluation  2. CAD: -As above -Titrate beta blocker -Continue Ranexa, titrate as needed -Has been on DAPT with ASA 81 mg and Brilinta 60 mg bid,  he does not want to change this at this time -Imdur has caused bad headache, avoid for now -No plans for  inpatient ischemia evaluation at this time  3. Accelerated HTN: -Main concern for presenting to the hospital -Titrate beta blocker as above -BP currently well controlled  4. Remaining per IM     Signed, Christell Faith, PA-C [facility] Pager: [phone] 07/27/2016, 1:55 PM

## 2016-07-27 NOTE — H&P (Signed)
[facility] NAME: William Mueller    MR#:  [id number]  DATE OF BIRTH:  Jul 18, 1950  DATE OF ADMISSION:  07/26/2016  PRIMARY CARE PHYSICIAN: Leonides Sake, MD   REQUESTING/REFERRING PHYSICIAN:   CHIEF COMPLAINT:   Chief Complaint  Patient presents with  . Chest Pain    HISTORY OF PRESENT ILLNESS: William Mueller  is a 66 y.o. male with a known history of Anxiety disorder, COPD, chronic kidney disease stage III, coronary artery disease with stents, diastolic heart failure, GERD, hypertension, hyperlipidemia presented to the emergency room with chest pain since last 3 days. The pain was on and off for the last 3 days. If it was a dull aching pressure-like pain on the left side of the chest. The pain was initially 5 out of 10 on a scale of 1-10. Pain was non radiating. Patient follows up with [facility] as outpatient. He was evaluated in the emergency room first set of troponin was negative. No shortness of breath, orthopnea and palpitations. Hospitalist service was consulted for further care of the patient.  PAST MEDICAL HISTORY:   Past Medical History:  Diagnosis Date  . Anxiety   . Chronic chest pain   . Chronic kidney disease (CKD), stage III (moderate)   . COPD (chronic obstructive pulmonary disease) ([place])   . Coronary artery disease    a. 12/2013 PCI: mRCA 100% with L to R collats s/p PCI/DES;  b. 06/2014 MV: no ischemia/infarct, EF 53%;  c. 03/2015 Cath: LM nl, LAD nl, D1 80 (1.79mm), LCX 7m (<1.24mm), OM1 nl, RCA 55p, patent stent, RPDA nl, EF 65%; d. 09/2015 Cath: LM nl, LAD 65m, D1 80 (<28mm), LCX 47m/d (<1.19mm), OM1 nl, RCA 55p (FFR 0.93), patent stent, RPDA nl-->Med Rx.  . Daily headache   . Depression   . Diastolic dysfunction    a. echo 12/2013: EF 55-60%, mild LVH, GR1DD, inf HK, elevated CVP, mildly dilated IVC suggestive of increased RA pressure  . GERD (gastroesophageal reflux disease)   . High cholesterol    . Hypertension   . Nicotine addiction    a. using eCigs.  . Sleep apnea     PAST SURGICAL HISTORY: Past Surgical History:  Procedure Laterality Date  . CARDIAC CATHETERIZATION     MC x 1 stent  . CARDIAC CATHETERIZATION Left 03/12/2015   Procedure: Left Heart Cath and Coronary Angiography;  Surgeon: Leonie Man, MD;  Location: [facility];  Service: Cardiovascular;  Laterality: Left;  . CARDIAC CATHETERIZATION N/A 09/29/2015   Procedure: Left Heart Cath and Coronary Angiography;  Surgeon: Wellington Hampshire, MD;  Location: [facility];  Service: Cardiovascular;  Laterality: N/A;  . CORONARY ANGIOPLASTY    . LEFT HEART CATHETERIZATION WITH CORONARY ANGIOGRAM N/A 12/12/2013   Procedure: LEFT HEART CATHETERIZATION WITH CORONARY ANGIOGRAM;  Surgeon: Wellington Hampshire, MD;  Location: [facility];  Service: Cardiovascular;  Laterality: N/A;    SOCIAL HISTORY:  Social History  Substance Use Topics  . Smoking status: Current Every Day Smoker    Years: 48.00    Types: E-cigarettes    Last attempt to quit: 06/07/2009  . Smokeless tobacco: Never Used     Comment: Using electronic cigarette  . Alcohol use 3.6 oz/week    6 Cans of beer per week     Comment: Occasional    FAMILY HISTORY:  Family History  Problem Relation Age of Onset  .  Coronary artery disease Brother   . Heart disease Brother 77  . Heart disease Father     MI x 8  . Kidney disease Neg Hx   . Prostate cancer Neg Hx     DRUG ALLERGIES:  Allergies  Allergen Reactions  . Benzodiazepines Shortness Of Breath    Patient is on Xanax  . Paroxetine Hcl Shortness Of Breath and Palpitations  . Serotonin Reuptake Inhibitors (Ssris) Shortness Of Breath and Palpitations  . Diazepam Other (See Comments)    Rapid heartrate  . Doxycycline Monohydrate Other (See Comments)    Unknown allergic reaction  . Escitalopram Oxalate Other (See Comments)    serotonin Syndrome  . Isosorbide Nitrate Other (See Comments)     Feels like head was going to explode -- HA  . Lorazepam Other (See Comments)    Unknown allergic reaction  . Tetracyclines & Related Itching and Rash    REVIEW OF SYSTEMS:   CONSTITUTIONAL: No fever, fatigue or weakness.  EYES: No blurred or double vision.  EARS, NOSE, AND THROAT: No tinnitus or ear pain.  RESPIRATORY: No cough, shortness of breath, wheezing or hemoptysis.  CARDIOVASCULAR: Has chest pain, no orthopnea, edema.  GASTROINTESTINAL: No nausea, vomiting, diarrhea or abdominal pain.  GENITOURINARY: No dysuria, hematuria.  ENDOCRINE: No polyuria, nocturia,  HEMATOLOGY: No anemia, easy bruising or bleeding SKIN: No rash or lesion. MUSCULOSKELETAL: No joint pain or arthritis.   NEUROLOGIC: No tingling, numbness, weakness.  PSYCHIATRY: No anxiety or depression.   MEDICATIONS AT HOME:  Prior to Admission medications   Medication Sig Start Date End Date Taking? Authorizing Provider  acetaminophen (TYLENOL) 500 MG tablet Take 1,000 mg by mouth daily as needed for headache.     Historical Provider, MD  albuterol (PROVENTIL) (2.5 MG/3ML) 0.083% nebulizer solution Take 3 mLs (2.5 mg total) by nebulization every 6 (six) hours as needed for wheezing or shortness of breath. 09/26/15   Minna Merritts, MD  ALPRAZolam (XANAX XR) 3 MG 24 hr tablet Take 3 mg by mouth every evening.     Historical Provider, MD  ALPRAZolam Duanne Moron) 1 MG tablet Take 1 mg by mouth daily.  11/21/13   Historical Provider, MD  amLODipine (NORVASC) 10 MG tablet Take 1 tablet (10 mg total) by mouth daily. 05/17/16 05/17/17  Minna Merritts, MD  aspirin 81 MG EC tablet Take 1 tablet (81 mg total) by mouth daily. 09/04/15   Minna Merritts, MD  atorvastatin (LIPITOR) 80 MG tablet Take 1 tablet (80 mg total) by mouth daily. 11/19/15   Minna Merritts, MD  budesonide-formoterol (SYMBICORT) 160-4.5 MCG/ACT inhaler Inhale 2 puffs into the lungs 2 (two) times daily. 09/04/15   Laverle Hobby, MD  ezetimibe (ZETIA)  10 MG tablet Take 1 tablet (10 mg total) by mouth daily. 06/14/16   Minna Merritts, MD  hydrALAZINE (APRESOLINE) 25 MG tablet Take 1 tablet (25 mg total) by mouth 3 (three) times daily as needed. 07/19/16   Minna Merritts, MD  metoprolol succinate (TOPROL-XL) 25 MG 24 hr tablet Take 1 tablet (25 mg total) by mouth daily. 06/28/16   Minna Merritts, MD  NITROSTAT 0.4 MG SL tablet PLACE 1 TABLET UNDER TONGUE EVERY 5 MINUTES AS NEEDED FOR CHEST PAIN.CALL 911 AFTER 3RD TABLET. 11/14/15   Minna Merritts, MD  omeprazole (PRILOSEC) 40 MG capsule Take 1 capsule (40 mg total) by mouth daily. 06/28/16   Minna Merritts, MD  ranolazine (RANEXA) 500 MG 12 hr tablet  Take 1 tablet (500 mg total) by mouth 2 (two) times daily. Patient taking differently: Take 500 mg by mouth daily.  10/30/15   Minna Merritts, MD  roflumilast (DALIRESP) 500 MCG TABS tablet Take 1 tablet (500 mcg total) by mouth daily. 09/04/15   Laverle Hobby, MD  senna-docusate (SENOKOT-S) 8.6-50 MG tablet Take 2 tablets by mouth daily as needed for mild constipation. 02/23/16 02/22/17  Eula Listen, MD  ticagrelor (BRILINTA) 60 MG TABS tablet Take 1 tablet (60 mg total) by mouth 2 (two) times daily. 12/16/14   Minna Merritts, MD  tiotropium (SPIRIVA) 18 MCG inhalation capsule Place 1 capsule (18 mcg total) into inhaler and inhale daily. 09/04/15   Laverle Hobby, MD  VENTOLIN HFA 108 (90 Base) MCG/ACT inhaler INHALE 1 TO 2 PUFFS INTO THE LUNGS EVERY 6 HOURS AS NEEDED FOR WHEEZING OR SHORTNESS OF BREATH. 03/25/16   Laverle Hobby, MD      PHYSICAL EXAMINATION:   VITAL SIGNS: Blood pressure 122/75, pulse 68, temperature 98.2 F (36.8 C), temperature source Oral, resp. rate 10, SpO2 98 %.  GENERAL:  66 y.o.-year-old patient lying in the bed with no acute distress.  EYES: Pupils equal, round, reactive to light and accommodation. No scleral icterus. Extraocular muscles intact.  HEENT: Head atraumatic, normocephalic.  Oropharynx and nasopharynx clear.  NECK:  Supple, no jugular venous distention. No thyroid enlargement, no tenderness.  LUNGS: Normal breath sounds bilaterally, no wheezing, rales,rhonchi or crepitation. No use of accessory muscles of respiration.  CARDIOVASCULAR: S1, S2 normal. No murmurs, rubs, or gallops.  ABDOMEN: Soft, nontender, nondistended. Bowel sounds present. No organomegaly or mass.  EXTREMITIES: No pedal edema, cyanosis, or clubbing.  NEUROLOGIC: Cranial nerves II through XII are intact. Muscle strength 5/5 in all extremities. Sensation intact. Gait not checked.  PSYCHIATRIC: The patient is alert and oriented x 3.  SKIN: No obvious rash, lesion, or ulcer.   LABORATORY PANEL:   CBC  Recent Labs Lab 07/26/16 2149  WBC 8.6  HGB 14.9  HCT 42.3  PLT 261  MCV 87.3  MCH 30.7  MCHC 35.2  RDW 13.2   ------------------------------------------------------------------------------------------------------------------  Chemistries   Recent Labs Lab 07/26/16 2149  NA 138  K 4.2  CL 103  CO2 28  GLUCOSE 101*  BUN 15  CREATININE 1.84*  CALCIUM 9.1   ------------------------------------------------------------------------------------------------------------------ CrCl cannot be calculated (Unknown ideal weight.). ------------------------------------------------------------------------------------------------------------------ No results for input(s): TSH, T4TOTAL, T3FREE, THYROIDAB in the last 72 hours.  Invalid input(s): FREET3   Coagulation profile No results for input(s): INR, PROTIME in the last 168 hours. ------------------------------------------------------------------------------------------------------------------- No results for input(s): DDIMER in the last 72 hours. -------------------------------------------------------------------------------------------------------------------  Cardiac Enzymes  Recent Labs Lab 07/26/16 2149  TROPONINI <0.03    ------------------------------------------------------------------------------------------------------------------ Invalid input(s): POCBNP  ---------------------------------------------------------------------------------------------------------------  Urinalysis    Component Value Date/Time   COLORURINE STRAW (A) 02/23/2016 1722   APPEARANCEUR CLEAR (A) 02/23/2016 1722   APPEARANCEUR Clear 04/21/2013 1022   LABSPEC 1.005 02/23/2016 1722   LABSPEC 1.002 04/21/2013 1022   PHURINE 6.0 02/23/2016 1722   GLUCOSEU NEGATIVE 02/23/2016 1722   GLUCOSEU Negative 04/21/2013 1022   HGBUR NEGATIVE 02/23/2016 1722   BILIRUBINUR NEGATIVE 02/23/2016 1722   BILIRUBINUR Negative 04/21/2013 1022   KETONESUR NEGATIVE 02/23/2016 1722   PROTEINUR NEGATIVE 02/23/2016 1722   UROBILINOGEN 0.2 11/22/2014 2213   NITRITE NEGATIVE 02/23/2016 1722   LEUKOCYTESUR NEGATIVE 02/23/2016 1722   LEUKOCYTESUR Negative 04/21/2013 1022     RADIOLOGY: Dg Chest 2 View  Result Date: 07/26/2016  CLINICAL DATA:  66 y/o  M; chest pain. EXAM: CHEST  2 VIEW COMPARISON:  09/27/2015 chest radiograph. FINDINGS: Stable heart size and mediastinal contours are within normal limits. Hyperinflated lungs with flattened diaphragms compatible with COPD. Both lungs are clear. Mild degenerative changes of the thoracic spine. IMPRESSION: COPD.  No active cardiopulmonary disease. Electronically Signed   By: Kristine Garbe M.D.   On: 07/26/2016 22:27    EKG: Orders placed or performed during the hospital encounter of 07/26/16  . EKG 12-Lead  . EKG 12-Lead  . ED EKG within 10 minutes  . ED EKG within 10 minutes    IMPRESSION AND PLAN: 66 year old male patient with history of coronary artery disease, cardiac stent, chronic kidney disease, COPD, anxiety disorder, diastolic dysfunction presented to the emergency room with chest pain. Admitting diagnosis 1. Chest pain 2. Coronary artery disease 3. Hypertension 4.  Emphysema 5. Anxiety disorder 6. Hyperlipidemia Treatment plan Admit patient to telemetry observation bed Aspirin 81 mg daily Resume oral brilinta home dose DVT prophylaxis subcutaneous Lovenox 40 MG daily Cycle troponin and check echocardiogram Cardiology consultation Resume beta blocker and statin medication Telemetry monitoring Supportive care.  All the records are reviewed and case discussed with ED provider. Management plans discussed with the patient, family and they are in agreement.  CODE STATUS:FULL CODE Surrogate decision maker : wife [name]) Code Status History    Date Active Date Inactive Code Status Order ID Comments User Context   09/28/2015  1:19 AM 09/29/2015  8:26 PM Full Code [id number]  Lance Coon, MD Inpatient   03/12/2015  9:16 AM 03/12/2015  2:41 PM Full Code [id number]  Leonie Man, MD Inpatient   03/01/2015  3:10 PM 03/02/2015  1:59 PM Full Code [id number]  Aldean Jewett, MD Inpatient   11/23/2014 12:24 AM 11/23/2014  2:13 PM Full Code [id number]  Lavina Hamman, MD ED   06/08/2014  8:50 PM 06/09/2014  6:13 PM Full Code [id number]  Ivor Costa, MD Inpatient   12/12/2013  1:24 PM 12/14/2013  5:02 PM Full Code [id number]  Wellington Hampshire, MD Inpatient   12/11/2013  3:08 PM 12/12/2013  1:24 PM Full Code [id number]  Drucilla Schmidt, MD Inpatient       TOTAL TIME TAKING CARE OF THIS PATIENT: 50 minutes.    Saundra Shelling M.D on 07/27/2016 at 2:43 AM  Between 7am to 6pm - Pager - [phone]  After 6pm go to [email or web address] - password [email or web address] [facility]  [facility]  Office  [phone]  CC: Primary care physician; Leonides Sake, MD

## 2016-07-27 NOTE — Progress Notes (Signed)
Discharge instructions explained to pt and pts spouse/ verbalized an understanding/ iv and tele removed/ RX given to pt/ transported off unit via wheelchair.  

## 2016-07-27 NOTE — Discharge Instructions (Signed)
Take Norvasc (amlodipine) at 11 am daily to see if this lowers the blood pressure in the early afternoon.

## 2016-07-27 NOTE — Progress Notes (Signed)
While rounding at the unit Chaplain visited patient. Patient and his wife were anticipating to go home. A nurse who did their discharge papers came in few minutes after Chaplain entered the patient's room. Chaplain provided words of encouragement and support to patient and his wife.

## 2016-07-27 NOTE — Discharge Summary (Signed)
William Mueller at [place] NAME: William Mueller    MR#:  [id number]  DATE OF BIRTH:  09/20/1950  DATE OF ADMISSION:  07/26/2016 ADMITTING PHYSICIAN: Saundra Shelling, MD  DATE OF DISCHARGE: 07/27/2016  3:30 PM  PRIMARY CARE PHYSICIAN: Leonides Sake, MD    ADMISSION DIAGNOSIS:  Chest pain, unspecified type [R07.9]  DISCHARGE DIAGNOSIS:  Principal Problem:   Atypical chest pain Active Problems:   Essential hypertension   COPD (chronic obstructive pulmonary disease) (HCC)   CKD (chronic kidney disease) stage 3, GFR 30-59 ml/min   Chronic chest pain   CAD S/P PCI DES to RCA   SECONDARY DIAGNOSIS:   Past Medical History:  Diagnosis Date  . Anxiety   . Chronic chest pain   . Chronic kidney disease (CKD), stage III (moderate)   . COPD (chronic obstructive pulmonary disease) ([place])   . Coronary artery disease    a. 12/2013 PCI: mRCA 100% with L to R collats s/p PCI/DES;  b. 06/2014 MV: no ischemia/infarct, EF 53%;  c. 03/2015 Cath: LM nl, LAD nl, D1 80 (1.19mm), LCX 54m (<1.63mm), OM1 nl, RCA 55p, patent stent, RPDA nl, EF 65%; d. 09/2015 Cath: LM nl, LAD 53m, D1 80 (<71mm), LCX 62m/d (<1.69mm), OM1 nl, RCA 55p (FFR 0.93), patent stent, RPDA nl-->Med Rx.  . Daily headache   . Depression   . Diastolic dysfunction    a. echo 12/2013: EF 55-60%, mild LVH, GR1DD, inf HK, elevated CVP, mildly dilated IVC suggestive of increased RA pressure  . GERD (gastroesophageal reflux disease)   . High cholesterol   . Hypertension   . Nicotine addiction    a. using eCigs.  . Sleep apnea     HOSPITAL COURSE:   1.  Hypertension. Patient states that in the afternoon his blood pressure goes up. He has when necessary hydralazine at home but has not worked recently. Cardiology increased his Toprol to 50 mg daily. I added Norvasc 10 mg at 11 AM to try to control his blood pressure. Follow-up one week with medical team. 2. Atypical chest pain. Stable chronic angina. Borderline  troponin 3. Chronic kidney disease stage III 4. COPD. Continue inhalers and they'll arrest. 5. Depression continue usual medication 6. Hyperlipidemia unspecified continue atorvastatin  DISCHARGE CONDITIONS:   Satisfactory  CONSULTS OBTAINED:  Treatment Team:  Nelva Bush, MD  DRUG ALLERGIES:   Allergies  Allergen Reactions  . Benzodiazepines Shortness Of Breath    Patient is on Xanax  . Paroxetine Hcl Shortness Of Breath and Palpitations  . Serotonin Reuptake Inhibitors (Ssris) Shortness Of Breath and Palpitations  . Diazepam Other (See Comments)    Rapid heartrate  . Doxycycline Monohydrate Other (See Comments)    Unknown allergic reaction  . Escitalopram Oxalate Other (See Comments)    serotonin Syndrome  . Isosorbide Nitrate Other (See Comments)    Feels like head was going to explode -- HA  . Lorazepam Other (See Comments)    Unknown allergic reaction  . Tetracyclines & Related Itching and Rash    DISCHARGE MEDICATIONS:   Discharge Medication List as of 07/27/2016  2:31 PM    CONTINUE these medications which have CHANGED   Details  amLODipine (NORVASC) 10 MG tablet Take daily at 11 am, Print    metoprolol succinate (TOPROL-XL) 50 MG 24 hr tablet Take 1 tablet (50 mg total) by mouth daily. Take with or immediately following a meal., Starting Wed 07/28/2016, Print    ranolazine (RANEXA)  500 MG 12 hr tablet Take 1 tablet (500 mg total) by mouth daily., Starting Tue 07/27/2016, No Print      CONTINUE these medications which have NOT CHANGED   Details  acetaminophen (TYLENOL) 500 MG tablet Take 1,000 mg by mouth daily as needed for headache. , Until Discontinued, Historical Med    albuterol (PROVENTIL) (2.5 MG/3ML) 0.083% nebulizer solution Take 3 mLs (2.5 mg total) by nebulization every 6 (six) hours as needed for wheezing or shortness of breath., Starting 09/26/2015, Until Discontinued, Normal    ALPRAZolam (XANAX XR) 3 MG 24 hr tablet Take 3 mg by mouth every  evening. , Until Discontinued, Historical Med    ALPRAZolam (XANAX) 1 MG tablet Take 1 mg by mouth daily. , Starting 11/21/2013, Until Discontinued, Historical Med    aspirin 81 MG EC tablet Take 1 tablet (81 mg total) by mouth daily., Starting 09/04/2015, Until Discontinued, Print    atorvastatin (LIPITOR) 80 MG tablet Take 1 tablet (80 mg total) by mouth daily., Starting 11/19/2015, Until Discontinued, Normal    budesonide-formoterol (SYMBICORT) 160-4.5 MCG/ACT inhaler Inhale 2 puffs into the lungs 2 (two) times daily., Starting 09/04/2015, Until Discontinued, Print    ezetimibe (ZETIA) 10 MG tablet Take 1 tablet (10 mg total) by mouth daily., Starting Mon 06/14/2016, Normal    hydrALAZINE (APRESOLINE) 25 MG tablet Take 1 tablet (25 mg total) by mouth 3 (three) times daily as needed., Starting Mon 07/19/2016, Normal    NITROSTAT 0.4 MG SL tablet PLACE 1 TABLET UNDER TONGUE EVERY 5 MINUTES AS NEEDED FOR CHEST PAIN.CALL 911 AFTER 3RD TABLET., Normal    omeprazole (PRILOSEC) 40 MG capsule Take 1 capsule (40 mg total) by mouth daily., Starting Mon 06/28/2016, Print    roflumilast (DALIRESP) 500 MCG TABS tablet Take 1 tablet (500 mcg total) by mouth daily., Starting 09/04/2015, Until Discontinued, Print    senna-docusate (SENOKOT-S) 8.6-50 MG tablet Take 2 tablets by mouth daily as needed for mild constipation., Starting Mon 02/23/2016, Until Tue 02/22/2017, Print    ticagrelor (BRILINTA) 60 MG TABS tablet Take 1 tablet (60 mg total) by mouth 2 (two) times daily., Starting 12/16/2014, Until Discontinued, Normal    tiotropium (SPIRIVA) 18 MCG inhalation capsule Place 1 capsule (18 mcg total) into inhaler and inhale daily., Starting 09/04/2015, Until Discontinued, Print    VENTOLIN HFA 108 (90 Base) MCG/ACT inhaler INHALE 1 TO 2 PUFFS INTO THE LUNGS EVERY 6 HOURS AS NEEDED FOR WHEEZING OR SHORTNESS OF BREATH., Normal         DISCHARGE INSTRUCTIONS:   Follow-up with medical doctor one week Follow-up  with cardiology 2 weeks  If you experience worsening of your admission symptoms, develop shortness of breath, life threatening emergency, suicidal or homicidal thoughts you must seek medical attention immediately by calling 911 or calling your MD immediately  if symptoms less severe.  You Must read complete instructions/literature along with all the possible adverse reactions/side effects for all the Medicines you take and that have been prescribed to you. Take any new Medicines after you have completely understood and accept all the possible adverse reactions/side effects.   Please note  You were cared for by a hospitalist during your hospital stay. If you have any questions about your discharge medications or the care you received while you were in the hospital after you are discharged, you can call the unit and asked to speak with the hospitalist on call if the hospitalist that took care of you is not available. Once you  are discharged, your primary care physician will handle any further medical issues. Please note that NO REFILLS for any discharge medications will be authorized once you are discharged, as it is imperative that you return to your primary care physician (or establish a relationship with a primary care physician if you do not have one) for your aftercare needs so that they can reassess your need for medications and monitor your lab values.    Today   CHIEF COMPLAINT:   Chief Complaint  Patient presents with  . Chest Pain    HISTORY OF PRESENT ILLNESS:  William Mueller  is a 66 y.o. male admitted with chest pain. Patient's major complaint was every afternoon his blood pressure goes up and he feels funny   VITAL SIGNS:  Blood pressure (!) 167/85, pulse 80, temperature 98.1 F (36.7 C), temperature source Oral, resp. rate 20, height 5\' 9"  (1.753 m), weight 87 kg (191 lb 14.4 oz), SpO2 97 %.    PHYSICAL EXAMINATION:  GENERAL:  66 y.o.-year-old patient lying in the bed with  no acute distress.  EYES: Pupils equal, round, reactive to light and accommodation. No scleral icterus. Extraocular muscles intact.  HEENT: Head atraumatic, normocephalic. Oropharynx and nasopharynx clear.  NECK:  Supple, no jugular venous distention. No thyroid enlargement, no tenderness.  LUNGS: Decreased breath sounds bilaterally, no wheezing, rales,rhonchi or crepitation. No use of accessory muscles of respiration.  CARDIOVASCULAR: S1, S2 normal. No murmurs, rubs, or gallops.  ABDOMEN: Soft, non-tender, non-distended. Bowel sounds present. No organomegaly or mass.  EXTREMITIES: No pedal edema, cyanosis, or clubbing.  NEUROLOGIC: Cranial nerves II through XII are intact. Muscle strength 5/5 in all extremities. Sensation intact. Gait not checked.  PSYCHIATRIC: The patient is alert and oriented x 3.  SKIN: No obvious rash, lesion, or ulcer.   DATA REVIEW:   CBC  Recent Labs Lab 07/26/16 2149  WBC 8.6  HGB 14.9  HCT 42.3  PLT 261    Chemistries   Recent Labs Lab 07/27/16 0359  NA 139  K 3.9  CL 105  CO2 27  GLUCOSE 100*  BUN 14  CREATININE 1.73*  CALCIUM 8.9    Cardiac Enzymes  Recent Labs Lab 07/27/16 0955  TROPONINI <0.03     RADIOLOGY:  Dg Chest 2 View  Result Date: 07/26/2016 CLINICAL DATA:  66 y/o  M; chest pain. EXAM: CHEST  2 VIEW COMPARISON:  09/27/2015 chest radiograph. FINDINGS: Stable heart size and mediastinal contours are within normal limits. Hyperinflated lungs with flattened diaphragms compatible with COPD. Both lungs are clear. Mild degenerative changes of the thoracic spine. IMPRESSION: COPD.  No active cardiopulmonary disease. Electronically Signed   By: Kristine Garbe M.D.   On: 07/26/2016 22:27    Management plans discussed with the patient, family and they are in agreement.  CODE STATUS:     Code Status Orders        Start     Ordered   07/27/16 0319  Full code  Continuous     07/27/16 0319    Code Status History     Date Active Date Inactive Code Status Order ID Comments User Context   09/28/2015  1:19 AM 09/29/2015  8:26 PM Full Code [id number]  Lance Coon, MD Inpatient   03/12/2015  9:16 AM 03/12/2015  2:41 PM Full Code [id number]  Leonie Man, MD Inpatient   03/01/2015  3:10 PM 03/02/2015  1:59 PM Full Code [id number]  Aldean Jewett, MD Inpatient   11/23/2014  12:24 AM 11/23/2014  2:13 PM Full Code [id number]  Lavina Hamman, MD ED   06/08/2014  8:50 PM 06/09/2014  6:13 PM Full Code [id number]  Ivor Costa, MD Inpatient   12/12/2013  1:24 PM 12/14/2013  5:02 PM Full Code [id number]  Wellington Hampshire, MD Inpatient   12/11/2013  3:08 PM 12/12/2013  1:24 PM Full Code [id number]  Drucilla Schmidt, MD Inpatient      TOTAL TIME TAKING CARE OF THIS PATIENT: 35 minutes.    Loletha Grayer M.D on 07/27/2016 at 4:38 PM  Between 7am to 6pm - Pager - [phone]  After 6pm go to [email or web address] - password [email or web address]  [employer] Office  [phone]  CC: Primary care physician; Leonides Sake, MD

## 2016-07-27 NOTE — ED Provider Notes (Signed)
[facility] Emergency Department Provider Note   First MD Initiated Contact with Patient 07/26/16 2335     (approximate)  I have reviewed the triage vital signs and the nursing notes.   HISTORY  Chief Complaint Chest Pain    HPI MIHAILO ALBRECHT is a 66 y.o. male with below list of chronic medical conditions presents to the emergency department with high blood pressure left sided chest pain with radiation to his left arm intermittently 3 days. Patient states his pain was initially 5 out of 10. Patient denies any dyspnea at this time no lower extremity pain or swelling.   Past Medical History:  Diagnosis Date  . Anxiety   . Chronic chest pain   . Chronic kidney disease (CKD), stage III (moderate)   . COPD (chronic obstructive pulmonary disease) ([place])   . Coronary artery disease    a. 12/2013 PCI: mRCA 100% with L to R collats s/p PCI/DES;  b. 06/2014 MV: no ischemia/infarct, EF 53%;  c. 03/2015 Cath: LM nl, LAD nl, D1 80 (1.40mm), LCX 27m (<1.78mm), OM1 nl, RCA 55p, patent stent, RPDA nl, EF 65%; d. 09/2015 Cath: LM nl, LAD 47m, D1 80 (<29mm), LCX 47m/d (<1.31mm), OM1 nl, RCA 55p (FFR 0.93), patent stent, RPDA nl-->Med Rx.  . Daily headache   . Depression   . Diastolic dysfunction    a. echo 12/2013: EF 55-60%, mild LVH, GR1DD, inf HK, elevated CVP, mildly dilated IVC suggestive of increased RA pressure  . GERD (gastroesophageal reflux disease)   . High cholesterol   . Hypertension   . Nicotine addiction    a. using eCigs.  . Sleep apnea     Patient Active Problem List   Diagnosis Date Noted  . Nicotine addiction   . CAD S/P PCI DES to RCA 03/11/2015  . Diastolic dysfunction   . Chronic chest pain   . Chest pain at rest 11/23/2014  . GERD (gastroesophageal reflux disease) 06/08/2014  . CKD (chronic kidney disease) stage 3, GFR 30-59 ml/min 05/04/2014  . Bilateral leg pain 05/04/2014  . Lung nodule < 6cm on CT 05/04/2014  . Abnormal CT scan, kidney  05/04/2014  . Unstable angina ([place]) 05/04/2014  . Chest pain 03/22/2014  . Atrial tachycardia, paroxysmal ([place]) 12/14/2013  . Atherosclerosis of native coronary artery with unstable angina pectoris ([place]) 12/13/2013  . NSTEMI (non-ST elevated myocardial infarction) ([place]) 12/13/2013  . Essential hypertension 12/11/2013  . Acute renal failure superimposed on stage 3 chronic kidney disease ([place]) 12/11/2013  . Hyperlipidemia 12/11/2013  . COPD (chronic obstructive pulmonary disease) ([place]) 12/11/2013    Past Surgical History:  Procedure Laterality Date  . CARDIAC CATHETERIZATION     MC x 1 stent  . CARDIAC CATHETERIZATION Left 03/12/2015   Procedure: Left Heart Cath and Coronary Angiography;  Surgeon: Leonie Man, MD;  Location: [place] CV [facility];  Service: Cardiovascular;  Laterality: Left;  . CARDIAC CATHETERIZATION N/A 09/29/2015   Procedure: Left Heart Cath and Coronary Angiography;  Surgeon: Wellington Hampshire, MD;  Location: [place] [facility];  Service: Cardiovascular;  Laterality: N/A;  . CORONARY ANGIOPLASTY    . LEFT HEART CATHETERIZATION WITH CORONARY ANGIOGRAM N/A 12/12/2013   Procedure: LEFT HEART CATHETERIZATION WITH CORONARY ANGIOGRAM;  Surgeon: Wellington Hampshire, MD;  Location: [place] CATH [facility];  Service: Cardiovascular;  Laterality: N/A;    Prior to Admission medications   Medication Sig Start Date End Date Taking? Authorizing Provider  acetaminophen (TYLENOL) 500 MG tablet  Take 1,000 mg by mouth daily as needed for headache.     Historical Provider, MD  albuterol (PROVENTIL) (2.5 MG/3ML) 0.083% nebulizer solution Take 3 mLs (2.5 mg total) by nebulization every 6 (six) hours as needed for wheezing or shortness of breath. 09/26/15   Minna Merritts, MD  ALPRAZolam (XANAX XR) 3 MG 24 hr tablet Take 3 mg by mouth every evening.     Historical Provider, MD  ALPRAZolam Duanne Moron) 1 MG tablet Take 1 mg by mouth daily.  11/21/13   Historical Provider, MD  amLODipine (NORVASC) 10 MG tablet  Take 1 tablet (10 mg total) by mouth daily. 05/17/16 05/17/17  Minna Merritts, MD  aspirin 81 MG EC tablet Take 1 tablet (81 mg total) by mouth daily. 09/04/15   Minna Merritts, MD  atorvastatin (LIPITOR) 80 MG tablet Take 1 tablet (80 mg total) by mouth daily. 11/19/15   Minna Merritts, MD  budesonide-formoterol (SYMBICORT) 160-4.5 MCG/ACT inhaler Inhale 2 puffs into the lungs 2 (two) times daily. 09/04/15   Laverle Hobby, MD  ezetimibe (ZETIA) 10 MG tablet Take 1 tablet (10 mg total) by mouth daily. 06/14/16   Minna Merritts, MD  hydrALAZINE (APRESOLINE) 25 MG tablet Take 1 tablet (25 mg total) by mouth 3 (three) times daily as needed. 07/19/16   Minna Merritts, MD  metoprolol succinate (TOPROL-XL) 25 MG 24 hr tablet Take 1 tablet (25 mg total) by mouth daily. 06/28/16   Minna Merritts, MD  NITROSTAT 0.4 MG SL tablet PLACE 1 TABLET UNDER TONGUE EVERY 5 MINUTES AS NEEDED FOR CHEST PAIN.CALL 911 AFTER 3RD TABLET. 11/14/15   Minna Merritts, MD  omeprazole (PRILOSEC) 40 MG capsule Take 1 capsule (40 mg total) by mouth daily. 06/28/16   Minna Merritts, MD  ranolazine (RANEXA) 500 MG 12 hr tablet Take 1 tablet (500 mg total) by mouth 2 (two) times daily. Patient taking differently: Take 500 mg by mouth daily.  10/30/15   Minna Merritts, MD  roflumilast (DALIRESP) 500 MCG TABS tablet Take 1 tablet (500 mcg total) by mouth daily. 09/04/15   Laverle Hobby, MD  senna-docusate (SENOKOT-S) 8.6-50 MG tablet Take 2 tablets by mouth daily as needed for mild constipation. 02/23/16 02/22/17  Eula Listen, MD  ticagrelor (BRILINTA) 60 MG TABS tablet Take 1 tablet (60 mg total) by mouth 2 (two) times daily. 12/16/14   Minna Merritts, MD  tiotropium (SPIRIVA) 18 MCG inhalation capsule Place 1 capsule (18 mcg total) into inhaler and inhale daily. 09/04/15   Laverle Hobby, MD  VENTOLIN HFA 108 (90 Base) MCG/ACT inhaler INHALE 1 TO 2 PUFFS INTO THE LUNGS EVERY 6 HOURS AS NEEDED FOR  WHEEZING OR SHORTNESS OF BREATH. 03/25/16   Laverle Hobby, MD    Allergies Benzodiazepines; Paroxetine hcl; Serotonin reuptake inhibitors (ssris); Diazepam; Doxycycline monohydrate; Escitalopram oxalate; Isosorbide nitrate; Lorazepam; and Tetracyclines & related  Family History  Problem Relation Age of Onset  . Coronary artery disease Brother   . Heart disease Brother 20  . Heart disease Father     MI x 8  . Kidney disease Neg Hx   . Prostate cancer Neg Hx     Social History Social History  Substance Use Topics  . Smoking status: Current Every Day Smoker    Years: 48.00    Types: E-cigarettes    Last attempt to quit: 06/07/2009  . Smokeless tobacco: Never Used     Comment: Using electronic cigarette  . Alcohol  use 3.6 oz/week    6 Cans of beer per week     Comment: Occasional    Review of Systems Constitutional: No fever/chills Eyes: No visual changes. ENT: No sore throat. Cardiovascular:Positive for chest pain. Respiratory: Denies shortness of breath. Gastrointestinal: No abdominal pain.  No nausea, no vomiting.  No diarrhea.  No constipation. Genitourinary: Negative for dysuria. Musculoskeletal: Negative for back pain. Skin: Negative for rash. Neurological: Negative for headaches, focal weakness or numbness.  10-point ROS otherwise negative.  ____________________________________________   PHYSICAL EXAM:  VITAL SIGNS: ED Triage Vitals  Enc Vitals Group     BP 07/26/16 2142 (!) 158/72     Pulse Rate 07/26/16 2142 94     Resp 07/26/16 2142 16     Temp 07/26/16 2142 98.2 F (36.8 C)     Temp Source 07/26/16 2142 Oral     SpO2 07/26/16 2142 97 %     Weight --      Height --      Head Circumference --      Peak Flow --      Pain Score 07/26/16 2152 3     Pain Loc --      Pain Edu? --      Excl. in [place]? --     Constitutional: Alert and oriented. Well appearing and in no acute distress. Eyes: Conjunctivae are normal. PERRL. EOMI. Head:  Atraumatic. Mouth/Throat: Mucous membranes are moist.  Oropharynx non-erythematous. Neck: No stridor.  Cardiovascular: Normal rate, regular rhythm. Good peripheral circulation. Grossly normal heart sounds. Respiratory: Normal respiratory effort.  No retractions. Lungs CTAB. Gastrointestinal: Soft and nontender. No distention.  Musculoskeletal: No lower extremity tenderness nor edema. No gross deformities of extremities. Neurologic:  Normal speech and language. No gross focal neurologic deficits are appreciated.  Skin:  Skin is warm, dry and intact. No rash noted. Psychiatric: Mood and affect are normal. Speech and behavior are normal.  ____________________________________________   LABS (all labs ordered are listed, but only abnormal results are displayed)  Labs Reviewed  BASIC METABOLIC PANEL - Abnormal; Notable for the following:       Result Value   Glucose, Bld 101 (*)    Creatinine, Ser 1.84 (*)    GFR calc non Af Amer 36 (*)    GFR calc Af Amer 42 (*)    All other components within normal limits  CBC  TROPONIN I   ____________________________________________  EKG  ED ECG REPORT I, [place] N Purvi Ruehl, the attending physician, personally viewed and interpreted this ECG.   Date: 07/28/2016  EKG Time: 9:47 PM  Rate: 95  Rhythm: Normal sinus rhythm  Axis: Normal  Intervals: Normal  ST&T Change: None  ____________________________________________  RADIOLOGY I,  N Emmamae Mcnamara, personally viewed and evaluated these images (plain radiographs) as part of my medical decision making, as well as reviewing the written report by the radiologist.  Dg Chest 2 View  Result Date: 07/26/2016 CLINICAL DATA:  66 y/o  M; chest pain. EXAM: CHEST  2 VIEW COMPARISON:  09/27/2015 chest radiograph. FINDINGS: Stable heart size and mediastinal contours are within normal limits. Hyperinflated lungs with flattened diaphragms compatible with COPD. Both lungs are clear. Mild degenerative changes  of the thoracic spine. IMPRESSION: COPD.  No active cardiopulmonary disease. Electronically Signed   By: Kristine Garbe M.D.   On: 07/26/2016 22:27     Procedures     INITIAL IMPRESSION / ASSESSMENT AND PLAN / ED COURSE  Pertinent labs & imaging results  that were available during my care of the patient were reviewed by me and considered in my medical decision making (see chart for details).  66 year old male presenting with left-sided chest pain radiation to his left arm ongoing chest discomfort. As such patient discussed with Dr. Estanislado Pandy for hospital admission for further evaluation and management.      ____________________________________________  FINAL CLINICAL IMPRESSION(S) / ED DIAGNOSES  Final diagnoses:  Chest pain, unspecified type     MEDICATIONS GIVEN DURING THIS VISIT:  Medications  nitroGLYCERIN (NITROSTAT) 0.4 MG SL tablet (not administered)     NEW OUTPATIENT MEDICATIONS STARTED DURING THIS VISIT:  New Prescriptions   No medications on file    Modified Medications   No medications on file    Discontinued Medications   No medications on file     Note:  This document was prepared using Dragon voice recognition software and may include unintentional dictation errors.    Gregor Hams, MD 07/28/16 [phone]

## 2016-07-27 NOTE — Care Management Obs Status (Signed)
[place] NOTIFICATION   Patient Details  Name: RIYAN FARON MRN: [id number] Date of Birth: 1950-09-04   Medicare Observation Status Notification Given:  Yes    Katrina Stack, RN 07/27/2016, 11:28 AM

## 2016-07-28 DIAGNOSIS — J449 Chronic obstructive pulmonary disease, unspecified: Secondary | ICD-10-CM | POA: Diagnosis not present

## 2016-07-29 ENCOUNTER — Telehealth: Payer: Self-pay | Admitting: Cardiovascular Disease

## 2016-07-29 NOTE — Telephone Encounter (Signed)
Pt wife states pt was in the ED on 2/19, and then admitted, due to CP and elevated BP. She states that when he was discharged on 2/20. Wife states pt BP is extremely low since he has been home from the hospital. States his readings yesterday morning 72/59 7:15am, 7:38 am 116/62, 9:07am 116/77, 12:34 pm 84/56, 2;50 pm 103/75, 7:49 pm124/86. This morning 7:45 86/64, 8:15 101/76. HR just now is 82.

## 2016-07-29 NOTE — Telephone Encounter (Addendum)
Spoke w/ pt's wife. Pt was recently hospitalized for CP & HTN. His metoprolol succinate was increased from 25 mg to 50 mg daily and he was started on amlodipine 10 mg daily. Pt has hydralazine for prn use, but has not needed to take this.  Since d/c, his BP has been very low, this am 86/64, 101/76.  He has not been taking amlodlipine at all 2/2 to low pressures. He has not increased his metoprolol, has remained on 25 mg daily; he has not taken this am. Maudie Flakes to have pt increase his fluids this am, as he is dizzy. Pt does not have hospital f/u appt, next available opening w/ Dr. Rockey Situ is 3/6. Advised her that I will make Dr. Rockey Situ aware of pt's concerns and call her back w/ his recommendation.

## 2016-07-29 NOTE — Telephone Encounter (Signed)
Spoke w/ pt's wife. Advised her of Dr. Donivan Scull recommendation.  She reports that pt is feeling better, his BP after we spoke this am was 131/84 after Gatorade. About an hour ago, 119/80, HR 106. Pt had labs done on 2/20 while hospitalized and there was no indication for infection. He denies cough, chills, or fever. [name] will continue to monitor BP, push fluids for low BP, hold metoprolol and call if readings start to climb. Pt sched to see Dr. Rockey Situ 3/6 @ 9:00, placed on wait list in the event of a cancellation.

## 2016-07-29 NOTE — Telephone Encounter (Signed)
I'm unclear what could be causing his low blood pressures Typically he runs high He is not on very much medication, only low-dose metoprolol Low blood pressure always a concern for infection any upper respiratory type infections concerning for pneumonia? Or sepsis? If no symptoms would hold the beta blocker, push fluids Make sure blood pressure is accurate

## 2016-08-02 ENCOUNTER — Ambulatory Visit (INDEPENDENT_AMBULATORY_CARE_PROVIDER_SITE_OTHER): Payer: PPO | Admitting: Cardiovascular Disease

## 2016-08-02 ENCOUNTER — Encounter: Payer: Self-pay | Admitting: Cardiovascular Disease

## 2016-08-02 VITALS — BP 130/82 | HR 81 | Ht 69.0 in | Wt 194.2 lb

## 2016-08-02 DIAGNOSIS — E782 Mixed hyperlipidemia: Secondary | ICD-10-CM

## 2016-08-02 DIAGNOSIS — I2510 Atherosclerotic heart disease of native coronary artery without angina pectoris: Secondary | ICD-10-CM | POA: Diagnosis not present

## 2016-08-02 DIAGNOSIS — I471 Supraventricular tachycardia: Secondary | ICD-10-CM

## 2016-08-02 DIAGNOSIS — Z9861 Coronary angioplasty status: Secondary | ICD-10-CM | POA: Diagnosis not present

## 2016-08-02 DIAGNOSIS — I10 Essential (primary) hypertension: Secondary | ICD-10-CM | POA: Diagnosis not present

## 2016-08-02 DIAGNOSIS — I4719 Other supraventricular tachycardia: Secondary | ICD-10-CM

## 2016-08-02 NOTE — Progress Notes (Signed)
Cardiology Office Note  Date:  08/02/2016   ID:  William Mueller, DOB [birth year], MRN [id number]  PCP:  Leonides Sake, MD   Chief Complaint  Patient presents with  . other    Pt. c/o BP fluctuating. Meds reviewed by the pt. verbally.     HPI:  William Mueller is a very pleasant 66 year old gentleman with a long history of smoking, COPD, chronic renal insufficiency, who presented 12/11/2013 to [facility] with chest pain. He was taken to the cardiac catheterization lab where he was found to have an occluded mid RCA. Stent was placed. Repeat catheterization in October 2016 with patent stent, but did have disease of a small diagonal and distal circumflex not amenable to intervention. 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 He still does not know the cause of his pain  EMS called to his house several months ago HR 147, on couch, did not feel right Sinus tach on EKG he provided to [place] today  Felt he was having a panic attack Talked with EMS and heart rate improved No further episodes since that time  He reports having periodic tachycardia when he wakes  up in the morning with associated hypoxemia. Numbers on his pulse oximeter are in the 80s  does not use oxygen at nighttime to sleep  April 2017 went to the emergency room with chest tightness, kept in the hospital, refused stress test, had cardiac catheterization showing patent stent, disease was relatively unchanged Date of catheterization 09/29/2015 Echocardiogram confirmed ejection fraction 65%  Previously did a PFT, technician reported that he passed out. He does not remember any significant difficulty but the test was abruptly stopped  Unable to tolerate isosorbide secondary to headache previously total cholesterol was 200  he presented to the hospital 02/26/2014 with chest pain. He seen by cardiology, rule out for MI, had normal EKG and discharged home. Echocardiogram July 2015 showed ejection fraction 55-60%  Creatinine in 10/22/2013 was 2.1  PMH:   has a past medical history of Anxiety; Chronic chest pain; Chronic kidney disease (CKD), stage III (moderate); COPD (chronic obstructive pulmonary disease) ([place]); Coronary artery disease; Daily headache; Depression; Diastolic dysfunction; GERD (gastroesophageal reflux disease); High cholesterol; Hypertension; Nicotine addiction; and Sleep apnea.  PSH:    Past Surgical History:  Procedure Laterality Date  . CARDIAC CATHETERIZATION     MC x 1 stent  . CARDIAC CATHETERIZATION Left 03/12/2015   Procedure: Left Heart Cath and Coronary Angiography;  Surgeon: William Man, MD;  Location: [facility];  Service: Cardiovascular;  Laterality: Left;  . CARDIAC CATHETERIZATION N/A 09/29/2015   Procedure: Left Heart Cath and Coronary Angiography;  Surgeon: William Hampshire,  MD;  Location: [facility];  Service: Cardiovascular;  Laterality: N/A;  . CORONARY ANGIOPLASTY    . LEFT HEART CATHETERIZATION WITH CORONARY ANGIOGRAM N/A 12/12/2013   Procedure: LEFT HEART CATHETERIZATION WITH CORONARY ANGIOGRAM;  Surgeon: William Hampshire, MD;  Location: [facility];  Service: Cardiovascular;  Laterality: N/A;    Current Outpatient Prescriptions  Medication Sig Dispense Refill  . acetaminophen (TYLENOL) 500 MG tablet Take 1,000 mg by mouth daily as needed for headache.     . albuterol (PROVENTIL) (2.5 MG/3ML) 0.083% nebulizer solution Take 3 mLs (2.5 mg total) by nebulization every 6 (six) hours as needed for wheezing or shortness of breath. 75 mL 12  . ALPRAZolam (XANAX XR) 3 MG 24 hr tablet Take 3 mg by mouth every evening.     Marland Kitchen ALPRAZolam (XANAX) 1 MG tablet Take 1 mg by mouth daily.     Marland Kitchen amLODipine (NORVASC) 10 MG tablet Take daily at 11 am 30 tablet 0  . aspirin 81 MG EC tablet Take 1 tablet (81 mg total) by mouth daily. 90 tablet 3  . atorvastatin (LIPITOR) 80 MG tablet Take 1 tablet (80 mg total) by mouth daily. 90 tablet 3  . budesonide-formoterol (SYMBICORT) 160-4.5 MCG/ACT inhaler Inhale 2 puffs into the lungs 2 (two) times daily. 3 Inhaler 1  . ezetimibe (ZETIA) 10 MG tablet Take 1 tablet (10 mg total) by mouth daily. 90 tablet 3  . hydrALAZINE (APRESOLINE) 25 MG tablet Take 1 tablet (25 mg total) by mouth 3 (three) times daily as needed. 270 tablet 3  . metoprolol succinate (TOPROL-XL) 50 MG 24 hr tablet Take 1 tablet (50 mg total) by mouth daily. Take with or immediately following a meal. 30 tablet 0  . montelukast (SINGULAIR) 10 MG tablet Take 10 mg by mouth at bedtime.    Marland Kitchen NITROSTAT 0.4 MG SL tablet PLACE 1 TABLET UNDER TONGUE EVERY 5 MINUTES AS NEEDED FOR CHEST PAIN.CALL 911 AFTER 3RD TABLET. 25 tablet 1  . omeprazole (PRILOSEC) 40 MG capsule Take 1 capsule (40 mg total) by mouth daily. 90 capsule 1  . ranolazine (RANEXA) 500 MG 12 hr tablet Take 1 tablet (500 mg total) by mouth daily.    . roflumilast (DALIRESP) 500 MCG TABS tablet Take 1 tablet (500 mcg total) by mouth daily. 90 tablet 1  . senna-docusate (SENOKOT-S) 8.6-50 MG tablet Take 2 tablets by mouth daily as needed for mild constipation. 30  tablet 0  . ticagrelor (BRILINTA) 60 MG TABS tablet Take 1 tablet (60 mg total) by mouth 2 (two) times daily. 60 tablet 11  . tiotropium (SPIRIVA) 18 MCG inhalation capsule Place 1 capsule (18 mcg total) into inhaler and inhale daily. 90 capsule 1  . VENTOLIN HFA 108 (90 Base) MCG/ACT inhaler INHALE 1 TO 2 PUFFS INTO THE LUNGS EVERY 6 HOURS AS NEEDED FOR WHEEZING OR SHORTNESS OF BREATH. 54 g 0   No current facility-administered medications for this visit.      Allergies:   Benzodiazepines; Paroxetine hcl; Serotonin reuptake inhibitors (ssris); Diazepam; Doxycycline monohydrate; Escitalopram oxalate; Isosorbide nitrate; Lorazepam; and Tetracyclines & related   Social History:  The patient  reports that he has been smoking E-cigarettes.  He has smoked for the past 48.00 years. He has never used smokeless tobacco. He reports that he drinks about 3.6 oz of alcohol per week . He reports that he does not use drugs.   Family History:   family history includes Coronary artery disease in  his brother; Heart disease in his father; Heart disease (age of onset: 57) in his brother.    Review of Systems: Review of Systems  Constitutional: Negative.   Respiratory: Positive for shortness of breath.   Cardiovascular: Positive for chest pain.  Gastrointestinal: Negative.   Musculoskeletal: Negative.   Neurological: Negative.   Psychiatric/Behavioral: The patient is nervous/anxious.   All other systems reviewed and are negative.    PHYSICAL EXAM: VS:  BP 130/82 (BP Location: Left Arm, Patient Position: Sitting, Cuff Size: Normal)   Pulse 81   Ht 5\' 9"  (1.753 m)   Wt 194 lb 4 oz (88.1 kg)   BMI 28.69 kg/m  , BMI Body mass index is 28.69 kg/m. GEN: Well nourished, well developed, in no acute distress  HEENT: normal  Neck: no JVD, carotid bruits, or masses Cardiac: RRR; no murmurs, rubs, or gallops,no edema  Respiratory:  Mildly decreased lung sounds throughout, normal work of breathing GI: soft,  nontender, nondistended, + BS MS: no deformity or atrophy  Skin: warm and dry, no rash Neuro:  Strength and sensation are intact Psych: euthymic mood, full affect    Recent Labs: 02/23/2016: ALT 21 07/26/2016: Hemoglobin 14.9; Platelets 261 07/27/2016: BUN 14; Creatinine, Ser 1.73; Potassium 3.9; Sodium 139    Lipid Panel Lab Results  Component Value Date   CHOL 106 07/27/2016   HDL 40 (L) 07/27/2016   LDLCALC 42 07/27/2016   TRIG 118 07/27/2016      Wt Readings from Last 3 Encounters:  08/02/16 194 lb 4 oz (88.1 kg)  07/27/16 191 lb 14.4 oz (87 kg)  05/17/16 193 lb 4 oz (87.7 kg)       ASSESSMENT AND PLAN:  Essential hypertension - Plan: EKG 12-Lead Recent hospitalization for atypical chest pain, labile blood pressure Discussed events in detail with him, Since he has been home has had low blood pressure, He cut back on the metoprolol that was recently increased in the hospital I suspect labile pressures secondary to underlying anxiety Likely having labile essential hypertension, Difficult to manage, likely exacerbated by his anxiety Recommended he use hydralazine for high blood pressures, Drink fluids and remained supine for low blood pressures We need to reevaluate his blood pressure cuff for accuracy  he reports recent systolic pressure of 70 that was not very symptomatic  Recommended under these circumstances he recheck his blood pressure with repeat measurements.  Mixed hyperlipidemia - Plan: EKG 12-Lead Cholesterol is at goal on the current lipid regimen. No changes to the medications were made.  Atrial tachycardia, paroxysmal (HCC) - Plan: EKG 12-Lead Denies any further episodes of tachycardia Previous symptoms possibly exacerbated by anxiety/stress Recommended he take extra metoprolol as needed for tachycardia symptoms also take Ativan for possible stress/anxiety induced tachycardia  CAD S/P PCI DES to RCA - Plan: EKG 12-Lead Recent atypical chest pain, no  testing performed in the hospital. Currently with no symptoms of angina. No further workup at this time. Continue current medication regimen.  Anxiety/stress Problems with his truck, also having other anxiety issues Recommended he talk with primary care, may need to be started on low-dose SSRI He reports that he has Ativan to take as needed, does not seem to help  Long discussion with patient and his wife concerning the above  Total encounter time more than 45 minutes  Greater than 50% was spent in counseling and coordination of care with the patient   Disposition:   F/U  6 months  Orders Placed This Encounter  Procedures  . EKG 12-Lead     Signed, Esmond Plants, M.D., Ph.D. 08/02/2016  [place], [place]

## 2016-08-02 NOTE — Patient Instructions (Signed)

## 2016-08-09 ENCOUNTER — Ambulatory Visit: Payer: Medicare Other | Admitting: Cardiovascular Disease

## 2016-08-09 ENCOUNTER — Other Ambulatory Visit: Payer: Self-pay

## 2016-08-09 MED ORDER — TICAGRELOR 60 MG PO TABS: 60.0000 mg | ORAL_TABLET | Freq: Two times a day (BID) | ORAL | 3 refills | Status: DC

## 2016-08-18 ENCOUNTER — Other Ambulatory Visit: Payer: Self-pay | Admitting: *Deleted

## 2016-08-18 ENCOUNTER — Telehealth: Payer: Self-pay | Admitting: Cardiovascular Disease

## 2016-08-18 MED ORDER — ATORVASTATIN CALCIUM 80 MG PO TABS: 80.0000 mg | ORAL_TABLET | Freq: Every day | ORAL | 3 refills | Status: DC

## 2016-08-18 NOTE — Telephone Encounter (Signed)
°*  STAT* If patient is at the pharmacy, call can be transferred to refill team.   1. Which medications need to be refilled? (please list name of each medication and dose if known)  Atorvastatin   2. Which pharmacy/location (including street and city if local pharmacy) is medication to be sent to? [facility] in [place]   3. Do they need a 30 day or 90 day supply? 90 day

## 2016-08-18 NOTE — Telephone Encounter (Signed)
Requested Prescriptions   Signed Prescriptions Disp Refills  . atorvastatin (LIPITOR) 80 MG tablet 90 tablet 3    Sig: Take 1 tablet (80 mg total) by mouth daily.    Authorizing Provider: GOLLAN, TIMOTHY J    Ordering User: Matilda Fleig C    

## 2016-08-18 NOTE — Telephone Encounter (Signed)
Requested Prescriptions   Signed Prescriptions Disp Refills  . atorvastatin (LIPITOR) 80 MG tablet 90 tablet 3    Sig: Take 1 tablet (80 mg total) by mouth daily.    Authorizing Provider: GOLLAN, TIMOTHY J    Ordering User: LOPEZ, MARINA C    

## 2016-08-25 DIAGNOSIS — J449 Chronic obstructive pulmonary disease, unspecified: Secondary | ICD-10-CM | POA: Diagnosis not present

## 2016-09-13 ENCOUNTER — Telehealth: Payer: Self-pay | Admitting: Pharmacy Technician

## 2016-09-13 NOTE — Telephone Encounter (Signed)
Ross Ludwig spouse of patient called to inquire about medication assistance for [employer].  [name] stated that according to the most recent EOB, Spiros still needs to spend over $3,000 before he reaches the coverage gap.  [name] is unsure if this amount is accurate.  Suggested that [facility] contact Medicare Part D provider to obtain how much more needs to be spent before Shantel reaches doughnut hole.  Told [name] that when Alton is about a month away from reaching the coverage gap, to call me and we will set-up an appointment for him.  Also, made [name] aware that we cannot provide any medication assistance until he is in the doughnut hole.  [name] acknowledged that she understood.  [facility]

## 2016-09-23 ENCOUNTER — Telehealth: Payer: Self-pay | Admitting: Cardiovascular Disease

## 2016-09-23 NOTE — Telephone Encounter (Signed)
Pt c/o BP issue: STAT if pt c/o blurred vision, one-sided weakness or slurred speech  1. What are your last 5 BP readings?  This morning  8:15- 97/73 HR 81 10 min ago 115/79 HR 96  2. Are you having any other symptoms (ex. Dizziness, headache, blurred vision, passed out)? Little headache, just dont feel right  3. What is your BP issue? Too low

## 2016-09-23 NOTE — Telephone Encounter (Signed)
Spoke w/ pt's wife.  She reports that pt's BP runs low every morning - typically 90s/70s. Readings generally come up around midday to 110s/70-80s. Pt's pulmonologist recently started him on Singulair 10 mg tabs at bedtime. He has not taken any hydralazine or metoprolol since starting Singulair. Pt's chart reads that he is to take amlodipine 10 mg daily @ 11:00, but he has 5 mg tabs that he has been cutting in 1/2. Pt has only been taking amlodipine 2.5 mg daily instead of 10 mg by mistake. Pt's wife would like to know if he should increase his amlodipine back to recommended dosage. She is more concerned about his low am BP readings and if this will have any effect on those readings. Advised her that I will make Dr. Rockey Situ aware of her concerns and call her back w/ his recommendation.

## 2016-09-24 DIAGNOSIS — J432 Centrilobular emphysema: Secondary | ICD-10-CM | POA: Diagnosis not present

## 2016-09-24 DIAGNOSIS — R0602 Shortness of breath: Secondary | ICD-10-CM | POA: Diagnosis not present

## 2016-09-25 DIAGNOSIS — J449 Chronic obstructive pulmonary disease, unspecified: Secondary | ICD-10-CM | POA: Diagnosis not present

## 2016-09-27 NOTE — Telephone Encounter (Signed)
Recommended he hold amlodipine for now and watch blood pressure

## 2016-09-28 NOTE — Telephone Encounter (Signed)
Spoke w/ pt's wife.  Advised her of Dr. Donivan Scull recommendation. She verbalizes understanding and will call back if his readings do not improve.

## 2016-10-25 DIAGNOSIS — J449 Chronic obstructive pulmonary disease, unspecified: Secondary | ICD-10-CM | POA: Diagnosis not present

## 2016-11-01 ENCOUNTER — Encounter: Payer: Self-pay | Admitting: Emergency Medicine

## 2016-11-01 ENCOUNTER — Emergency Department: Payer: PPO

## 2016-11-01 ENCOUNTER — Emergency Department
Admission: EM | Admit: 2016-11-01 | Discharge: 2016-11-01 | Disposition: A | Discharge status: Home / Self Care | Payer: PPO | Attending: Emergency Medicine | Admitting: Emergency Medicine

## 2016-11-01 DIAGNOSIS — R002 Palpitations: Secondary | ICD-10-CM | POA: Diagnosis not present

## 2016-11-01 DIAGNOSIS — I2510 Atherosclerotic heart disease of native coronary artery without angina pectoris: Secondary | ICD-10-CM | POA: Diagnosis not present

## 2016-11-01 DIAGNOSIS — F17210 Nicotine dependence, cigarettes, uncomplicated: Secondary | ICD-10-CM | POA: Insufficient documentation

## 2016-11-01 DIAGNOSIS — Z7982 Long term (current) use of aspirin: Secondary | ICD-10-CM | POA: Diagnosis not present

## 2016-11-01 DIAGNOSIS — N183 Chronic kidney disease, stage 3 (moderate): Secondary | ICD-10-CM | POA: Insufficient documentation

## 2016-11-01 DIAGNOSIS — J449 Chronic obstructive pulmonary disease, unspecified: Secondary | ICD-10-CM | POA: Diagnosis not present

## 2016-11-01 DIAGNOSIS — Z79899 Other long term (current) drug therapy: Secondary | ICD-10-CM | POA: Diagnosis not present

## 2016-11-01 DIAGNOSIS — R0789 Other chest pain: Secondary | ICD-10-CM | POA: Diagnosis not present

## 2016-11-01 DIAGNOSIS — I129 Hypertensive chronic kidney disease with stage 1 through stage 4 chronic kidney disease, or unspecified chronic kidney disease: Secondary | ICD-10-CM | POA: Insufficient documentation

## 2016-11-01 DIAGNOSIS — R079 Chest pain, unspecified: Secondary | ICD-10-CM

## 2016-11-01 LAB — CBC
HCT: 40.7 % (ref 40.0–52.0)
Hemoglobin: 13.9 g/dL (ref 13.0–18.0)
MCH: 29.5 pg (ref 26.0–34.0)
MCHC: 34.2 g/dL (ref 32.0–36.0)
MCV: 86 fL (ref 80.0–100.0)
PLATELETS: 263 10*3/uL (ref 150–440)
RBC: 4.73 MIL/uL (ref 4.40–5.90)
RDW: 13 % (ref 11.5–14.5)
WBC: 10.6 10*3/uL (ref 3.8–10.6)

## 2016-11-01 LAB — TROPONIN I

## 2016-11-01 LAB — BASIC METABOLIC PANEL
Anion gap: 6 (ref 5–15)
BUN: 13 mg/dL (ref 6–20)
CALCIUM: 9.1 mg/dL (ref 8.9–10.3)
CHLORIDE: 103 mmol/L (ref 101–111)
CO2: 28 mmol/L (ref 22–32)
CREATININE: 1.53 mg/dL — AB (ref 0.61–1.24)
GFR calc non Af Amer: 45 mL/min — ABNORMAL LOW (ref >60)
GFR, EST AFRICAN AMERICAN: 53 mL/min — AB (ref >60)
Glucose, Bld: 96 mg/dL (ref 65–99)
Potassium: 3.7 mmol/L (ref 3.5–5.1)
SODIUM: 137 mmol/L (ref 135–145)

## 2016-11-01 NOTE — ED Notes (Signed)
Patient transported to X-ray 

## 2016-11-01 NOTE — ED Provider Notes (Signed)
[facility] Emergency Department Provider Note ____________________________________________   I have reviewed the triage vital signs and the triage nursing note.  HISTORY  Chief Complaint Chest Pain   Historian Patient  HPI William Mueller is a 66 y.o. male with a history of coronary artery disease, history of multiple stents, patient states last was in 2015, as well as history of chronic chest pain, states that he had been doing pretty well, but the past 3 days he had had a feeling of fluttering and pressure in the center and slightly left side of his chest. Not associated with exertion. This morning he felt like his pulse was irregular, and no history of such.   No nausea or vomiting. No weakness or numbness.  He follows with Dr. Rockey Situ with cardiology.    Past Medical History:  Diagnosis Date  . Anxiety   . Chronic chest pain   . Chronic kidney disease (CKD), stage III (moderate)   . COPD (chronic obstructive pulmonary disease) ([place])   . Coronary artery disease    a. 12/2013 PCI: mRCA 100% with L to R collats s/p PCI/DES;  b. 06/2014 MV: no ischemia/infarct, EF 53%;  c. 03/2015 Cath: LM nl, LAD nl, D1 80 (1.76mm), LCX 68m (<1.68mm), OM1 nl, RCA 55p, patent stent, RPDA nl, EF 65%; d. 09/2015 Cath: LM nl, LAD 70m, D1 80 (<93mm), LCX 66m/d (<1.9mm), OM1 nl, RCA 55p (FFR 0.93), patent stent, RPDA nl-->Med Rx.  . Daily headache   . Depression   . Diastolic dysfunction    a. echo 12/2013: EF 55-60%, mild LVH, GR1DD, inf HK, elevated CVP, mildly dilated IVC suggestive of increased RA pressure  . GERD (gastroesophageal reflux disease)   . High cholesterol   . Hypertension   . Nicotine addiction    a. using eCigs.  . Sleep apnea     Patient Active Problem List   Diagnosis Date Noted  . Nicotine addiction   . CAD S/P PCI DES to RCA 03/11/2015  . Diastolic dysfunction   . Chronic chest pain   . Chest pain at rest 11/23/2014  . GERD (gastroesophageal reflux  disease) 06/08/2014  . CKD (chronic kidney disease) stage 3, GFR 30-59 ml/min 05/04/2014  . Bilateral leg pain 05/04/2014  . Lung nodule < 6cm on CT 05/04/2014  . Abnormal CT scan, kidney 05/04/2014  . Unstable angina ([place]) 05/04/2014  . Atypical chest pain 03/22/2014  . Atrial tachycardia, paroxysmal ([place]) 12/14/2013  . Atherosclerosis of native coronary artery with unstable angina pectoris ([place]) 12/13/2013  . NSTEMI (non-ST elevated myocardial infarction) ([place]) 12/13/2013  . Labile essential hypertension 12/11/2013  . Acute renal failure superimposed on stage 3 chronic kidney disease ([place]) 12/11/2013  . Hyperlipidemia 12/11/2013  . COPD (chronic obstructive pulmonary disease) ([place]) 12/11/2013    Past Surgical History:  Procedure Laterality Date  . CARDIAC CATHETERIZATION     MC x 1 stent  . CARDIAC CATHETERIZATION Left 03/12/2015   Procedure: Left Heart Cath and Coronary Angiography;  Surgeon: Leonie Man, MD;  Location: [facility];  Service: Cardiovascular;  Laterality: Left;  . CARDIAC CATHETERIZATION N/A 09/29/2015   Procedure: Left Heart Cath and Coronary Angiography;  Surgeon: Wellington Hampshire, MD;  Location: [facility];  Service: Cardiovascular;  Laterality: N/A;  . CORONARY ANGIOPLASTY    . LEFT HEART CATHETERIZATION WITH CORONARY ANGIOGRAM N/A 12/12/2013   Procedure: LEFT HEART CATHETERIZATION WITH CORONARY ANGIOGRAM;  Surgeon: Wellington Hampshire, MD;  Location: [facility]  CATH LAB;  Service: Cardiovascular;  Laterality: N/A;    Prior to Admission medications   Medication Sig Start Date End Date Taking? Authorizing Provider  acetaminophen (TYLENOL) 500 MG tablet Take 1,000 mg by mouth daily as needed for headache.    Yes [provider]  albuterol (PROVENTIL) (2.5 MG/3ML) 0.083% nebulizer solution Take 3 mLs (2.5 mg total) by nebulization every 6 (six) hours as needed for wheezing or shortness of breath. 09/26/15  Yes Gollan, Kathlene November, MD  ALPRAZolam  (XANAX XR) 3 MG 24 hr tablet Take 3 mg by mouth every evening.    Yes [provider]  ALPRAZolam Duanne Moron) 1 MG tablet Take 1 mg by mouth daily.  11/21/13  Yes [provider]  amLODipine (NORVASC) 5 MG tablet Take 5 mg by mouth daily.   Yes [provider]  aspirin 81 MG EC tablet Take 1 tablet (81 mg total) by mouth daily. 09/04/15  Yes Minna Merritts, MD  atorvastatin (LIPITOR) 80 MG tablet Take 1 tablet (80 mg total) by mouth daily. 08/18/16  Yes Minna Merritts, MD  budesonide-formoterol (SYMBICORT) 160-4.5 MCG/ACT inhaler Inhale 2 puffs into the lungs 2 (two) times daily. 09/04/15  Yes Laverle Hobby, MD  ezetimibe (ZETIA) 10 MG tablet Take 1 tablet (10 mg total) by mouth daily. 06/14/16  Yes Minna Merritts, MD  hydrALAZINE (APRESOLINE) 25 MG tablet Take 1 tablet (25 mg total) by mouth 3 (three) times daily as needed. 07/19/16  Yes Minna Merritts, MD  metoprolol succinate (TOPROL-XL) 50 MG 24 hr tablet Take 1 tablet (50 mg total) by mouth daily. Take with or immediately following a meal. Patient taking differently: Take 25 mg by mouth daily. Take with or immediately following a meal. 07/28/16  Yes Wieting, Richard, MD  montelukast (SINGULAIR) 10 MG tablet Take 10 mg by mouth at bedtime.   Yes [provider]  NITROSTAT 0.4 MG SL tablet PLACE 1 TABLET UNDER TONGUE EVERY 5 MINUTES AS NEEDED FOR CHEST PAIN.CALL 911 AFTER 3RD TABLET. 11/14/15  Yes Gollan, Kathlene November, MD  omeprazole (PRILOSEC) 40 MG capsule Take 1 capsule (40 mg total) by mouth daily. 06/28/16  Yes Minna Merritts, MD  ranolazine (RANEXA) 500 MG 12 hr tablet Take 1 tablet (500 mg total) by mouth daily. Patient taking differently: Take 250 mg by mouth at bedtime.  07/27/16  Yes Wieting, Richard, MD  roflumilast (DALIRESP) 500 MCG TABS tablet Take 1 tablet (500 mcg total) by mouth daily. 09/04/15  Yes Laverle Hobby, MD  ticagrelor (BRILINTA) 60 MG TABS tablet Take 1 tablet (60 mg total)  by mouth 2 (two) times daily. 08/09/16  Yes Minna Merritts, MD  tiotropium (SPIRIVA) 18 MCG inhalation capsule Place 1 capsule (18 mcg total) into inhaler and inhale daily. 09/04/15  Yes Laverle Hobby, MD  VENTOLIN HFA 108 (90 Base) MCG/ACT inhaler INHALE 1 TO 2 PUFFS INTO THE LUNGS EVERY 6 HOURS AS NEEDED FOR WHEEZING OR SHORTNESS OF BREATH. 03/25/16  Yes Laverle Hobby, MD  senna-docusate (SENOKOT-S) 8.6-50 MG tablet Take 2 tablets by mouth daily as needed for mild constipation. Patient not taking: Reported on 11/01/2016 02/23/16 02/22/17  Eula Listen, MD    Allergies  Allergen Reactions  . Benzodiazepines Shortness Of Breath    Patient is on Xanax  . Paroxetine Hcl Shortness Of Breath and Palpitations  . Serotonin Reuptake Inhibitors (Ssris) Shortness Of Breath and Palpitations  . Diazepam Other (See Comments)    Rapid heartrate  .  Doxycycline Monohydrate Other (See Comments)    Unknown allergic reaction  . Escitalopram Oxalate Other (See Comments)    serotonin Syndrome  . Isosorbide Nitrate Other (See Comments)    Feels like head was going to explode -- HA  . Lorazepam Other (See Comments)    Unknown allergic reaction  . Tetracyclines & Related Itching and Rash    Family History  Problem Relation Age of Onset  . Coronary artery disease Brother   . Heart disease Brother 61  . Heart disease Father        MI x 8  . Kidney disease Neg Hx   . Prostate cancer Neg Hx     Social History Social History  Substance Use Topics  . Smoking status: Current Every Day Smoker    Years: 48.00    Types: E-cigarettes    Last attempt to quit: 06/07/2009  . Smokeless tobacco: Never Used     Comment: Using electronic cigarette  . Alcohol use 3.6 oz/week    6 Cans of beer per week     Comment: Occasional    Review of Systems  Constitutional: Negative for fever. Eyes: Negative for visual changes. ENT: Negative for sore throat. Cardiovascular: The first 3 days of  chest discomfort. Respiratory: Negative for shortness of breath. Gastrointestinal: Negative for abdominal pain, vomiting and diarrhea. Genitourinary: Negative for dysuria. Musculoskeletal: Negative for back pain. Skin: Negative for rash. Neurological: Negative for headache.  ____________________________________________   PHYSICAL EXAM:  VITAL SIGNS: ED Triage Vitals   Enc Vitals Group     BP 130/80     Pulse Rate 91     Resp (!) 22     Temp 98.3 F (36.8 C)     Temp Source Oral     SpO2 99 %     Weight 194 lb (88 kg)     Height 5\' 9"  (1.753 m)     Head Circumference      Peak Flow      Pain Score 0     Pain Loc      Pain Edu?      Excl. in [place]?      Constitutional: Alert and oriented. Well appearing and in no distress. HEENT   Head: Normocephalic and atraumatic.      Eyes: Conjunctivae are normal. Pupils equal and round.       Ears:         Nose: No congestion/rhinnorhea.   Mouth/Throat: Mucous membranes are moist.   Neck: No stridor. Cardiovascular/Chest: Normal rate, regular rhythm.  No murmurs, rubs, or gallops. Respiratory: Normal respiratory effort without tachypnea nor retractions. Breath sounds are clear and equal bilaterally. No wheezes/rales/rhonchi. Gastrointestinal: Soft. No distention, no guarding, no rebound. Nontender.    Genitourinary/rectal:Deferred Musculoskeletal: Nontender with normal range of motion in all extremities. No joint effusions.  No lower extremity tenderness.  No edema. Neurologic:  Normal speech and language. No gross or focal neurologic deficits are appreciated. Skin:  Skin is warm, dry and intact. No rash noted. Psychiatric: Mood and affect are normal. Speech and behavior are normal. Patient exhibits appropriate insight and judgment.   ____________________________________________  LABS (pertinent positives/negatives)  Labs Reviewed  BASIC METABOLIC PANEL - Abnormal; Notable for the following:       Result Value    Creatinine, Ser 1.53 (*)    GFR calc non Af Amer 45 (*)    GFR calc Af Amer 53 (*)    All other components within normal limits  CBC  TROPONIN I    ____________________________________________    EKG I, Lisa Roca, MD, the attending physician have personally viewed and interpreted all ECGs.  87 bpm. Normal sinus rhythm. Narrow QRS. Normal axis. Nonspecific T-wave ____________________________________________  RADIOLOGY All Xrays were viewed by me. Imaging interpreted by Radiologist.  Chest x-ray two-view:  The heart size and mediastinal contours are stable. Coronary artery stent noted. The lungs are clear. There is no pleural effusion or pneumothorax. Small radiodensity overlapping the anterior aspect of the left second rib on the frontal examination is stable, corresponding with a bone island on prior CT. EKG snaps overlie the chest bilaterally. No acute osseous findings are seen.  IMPRESSION: Stable chest.  No active cardiopulmonary process __________________________________________  PROCEDURES  Procedure(s) performed: None  Critical Care performed: None  ____________________________________________   ED COURSE / ASSESSMENT AND PLAN  Pertinent labs & imaging results that were available during my care of the patient were reviewed by me and considered in my medical decision making (see chart for details).    Mr. Bunda presented for chest discomfort over 3 days now, without particular associated symptoms.  The palpitations seem to be more unusual to him than the pressure.  No arrhythmias here.  No concerning signs on ecg for ischemia.  Troponin negative (after 3 days of discomfort).    Symptoms do not seem consistent with intra abdominal emergency or large vascular emergency.  I am recommending the patient follow up with his cardiologist, consider Holter monitoring and/or stress testing.    CONSULTATIONS:  Dr. Fletcher Anon, [facility] health cardiology, by phone reviewed  the history and presentation and discussion regarding negative troponin and EKG, and does not recommend any additional emergency department treatment or hospitalization, and recommended the patient call for follow-up to doctors office in the morning.   Patient / Family / Caregiver informed of clinical course, medical decision-making process, and agree with plan.   I discussed return precautions, follow-up instructions, and discharge instructions with patient and/or family.  Discharge Instructions : Although no certain cause was found for your palpitation and chest discomfort over the last 3 days, you examine evaluation are reassuring in the emergency department today.  Return to the emergency room immediately for any worsening chest pain, especially any associated with shortness of breath or nausea or sweats or weakness or numbness, or any other symptoms concerning to you.  ___________________________________________   FINAL CLINICAL IMPRESSION(S) / ED DIAGNOSES   Final diagnoses:  Palpitations  Nonspecific chest pain              Note: This dictation was prepared with Dragon dictation. Any transcriptional errors that result from this process are unintentional    Lisa Roca, MD 11/01/16 1250

## 2016-11-01 NOTE — ED Triage Notes (Signed)
Pt reports left sided chest pain relieved by 2 nitro. Pt with hx of 1 stent, reports 3 additional blockages. Pt reports continued shortness of breath.

## 2016-11-01 NOTE — Discharge Instructions (Signed)
Although no certain cause was found for your palpitation and chest discomfort over the last 3 days, you examine evaluation are reassuring in the emergency department today.  Return to the emergency room immediately for any worsening chest pain, especially any associated with shortness of breath or nausea or sweats or weakness or numbness, or any other symptoms concerning to you.

## 2016-11-02 ENCOUNTER — Telehealth: Payer: Self-pay | Admitting: Cardiovascular Disease

## 2016-11-02 NOTE — Telephone Encounter (Signed)
"  He went to the emergency room on Monday with chest pain. EKG was unremarkable and troponin was negative. He needs a close follow-up appointment with Dr. Rockey Situ or Gerald Stabs"  Pt scheduled to see Dr. Rockey Situ June 4th. S/w pt who is aware of appt as wife made it today.

## 2016-11-06 ENCOUNTER — Emergency Department: Payer: PPO

## 2016-11-06 ENCOUNTER — Encounter: Payer: Self-pay | Admitting: Emergency Medicine

## 2016-11-06 ENCOUNTER — Emergency Department
Admission: EM | Admit: 2016-11-06 | Discharge: 2016-11-06 | Disposition: A | Discharge status: Home / Self Care | Payer: PPO | Attending: Emergency Medicine | Admitting: Emergency Medicine

## 2016-11-06 DIAGNOSIS — I129 Hypertensive chronic kidney disease with stage 1 through stage 4 chronic kidney disease, or unspecified chronic kidney disease: Secondary | ICD-10-CM | POA: Diagnosis not present

## 2016-11-06 DIAGNOSIS — R0602 Shortness of breath: Secondary | ICD-10-CM | POA: Diagnosis not present

## 2016-11-06 DIAGNOSIS — J449 Chronic obstructive pulmonary disease, unspecified: Secondary | ICD-10-CM | POA: Diagnosis not present

## 2016-11-06 DIAGNOSIS — M79604 Pain in right leg: Secondary | ICD-10-CM

## 2016-11-06 DIAGNOSIS — S8992XA Unspecified injury of left lower leg, initial encounter: Secondary | ICD-10-CM | POA: Diagnosis not present

## 2016-11-06 DIAGNOSIS — I2510 Atherosclerotic heart disease of native coronary artery without angina pectoris: Secondary | ICD-10-CM | POA: Insufficient documentation

## 2016-11-06 DIAGNOSIS — Y929 Unspecified place or not applicable: Secondary | ICD-10-CM | POA: Diagnosis not present

## 2016-11-06 DIAGNOSIS — M79662 Pain in left lower leg: Secondary | ICD-10-CM | POA: Diagnosis not present

## 2016-11-06 DIAGNOSIS — X58XXXA Exposure to other specified factors, initial encounter: Secondary | ICD-10-CM | POA: Diagnosis not present

## 2016-11-06 DIAGNOSIS — Z7982 Long term (current) use of aspirin: Secondary | ICD-10-CM | POA: Diagnosis not present

## 2016-11-06 DIAGNOSIS — R079 Chest pain, unspecified: Secondary | ICD-10-CM | POA: Diagnosis not present

## 2016-11-06 DIAGNOSIS — Y939 Activity, unspecified: Secondary | ICD-10-CM | POA: Diagnosis not present

## 2016-11-06 DIAGNOSIS — Y999 Unspecified external cause status: Secondary | ICD-10-CM | POA: Insufficient documentation

## 2016-11-06 DIAGNOSIS — F17210 Nicotine dependence, cigarettes, uncomplicated: Secondary | ICD-10-CM | POA: Insufficient documentation

## 2016-11-06 DIAGNOSIS — T148XXA Other injury of unspecified body region, initial encounter: Secondary | ICD-10-CM

## 2016-11-06 DIAGNOSIS — M79605 Pain in left leg: Secondary | ICD-10-CM

## 2016-11-06 DIAGNOSIS — N183 Chronic kidney disease, stage 3 (moderate): Secondary | ICD-10-CM | POA: Diagnosis not present

## 2016-11-06 DIAGNOSIS — M7989 Other specified soft tissue disorders: Secondary | ICD-10-CM

## 2016-11-06 DIAGNOSIS — R2241 Localized swelling, mass and lump, right lower limb: Secondary | ICD-10-CM | POA: Diagnosis not present

## 2016-11-06 DIAGNOSIS — S8012XA Contusion of left lower leg, initial encounter: Secondary | ICD-10-CM | POA: Diagnosis not present

## 2016-11-06 DIAGNOSIS — M79661 Pain in right lower leg: Secondary | ICD-10-CM | POA: Diagnosis not present

## 2016-11-06 LAB — BASIC METABOLIC PANEL
Anion gap: 8 (ref 5–15)
BUN: 15 mg/dL (ref 6–20)
CHLORIDE: 101 mmol/L (ref 101–111)
CO2: 27 mmol/L (ref 22–32)
CREATININE: 1.62 mg/dL — AB (ref 0.61–1.24)
Calcium: 9.4 mg/dL (ref 8.9–10.3)
GFR calc non Af Amer: 42 mL/min — ABNORMAL LOW (ref >60)
GFR, EST AFRICAN AMERICAN: 49 mL/min — AB (ref >60)
Glucose, Bld: 103 mg/dL — ABNORMAL HIGH (ref 65–99)
POTASSIUM: 4.3 mmol/L (ref 3.5–5.1)
SODIUM: 136 mmol/L (ref 135–145)

## 2016-11-06 LAB — TROPONIN I: Troponin I: <0.03 ng/mL (ref <0.03)

## 2016-11-06 LAB — CBC
HEMATOCRIT: 41.4 % (ref 40.0–52.0)
Hemoglobin: 14.2 g/dL (ref 13.0–18.0)
MCH: 29.8 pg (ref 26.0–34.0)
MCHC: 34.4 g/dL (ref 32.0–36.0)
MCV: 86.8 fL (ref 80.0–100.0)
Platelets: 286 10*3/uL (ref 150–440)
RBC: 4.77 MIL/uL (ref 4.40–5.90)
RDW: 13.3 % (ref 11.5–14.5)
WBC: 9.4 10*3/uL (ref 3.8–10.6)

## 2016-11-06 LAB — BRAIN NATRIURETIC PEPTIDE: B Natriuretic Peptide: 21 pg/mL (ref 0.0–100.0)

## 2016-11-06 NOTE — Discharge Instructions (Signed)
If you have any chest pain or shortness of breath or leg swelling, or you feel worse in any way please return to the emergency department. Otherwise, follow closely as already scheduled with her cardiologist and your  primary care doctor.

## 2016-11-06 NOTE — ED Provider Notes (Signed)
[facility] Emergency Department Provider Note  ____________________________________________   I have reviewed the triage vital signs and the nursing notes.   HISTORY  Chief Complaint Leg Pain    HPI William Mueller is a 66 y.o. male with a history of anxiety, COPD, chronic headaches, CAD, hypertension, nicotine addiction,, no history of blood clots presents today with a small area he noted on his right calf that was tender. He noticed it while sitting. Does not remember injuring himself. Despite triage note patient is actually had no chest pain no shortness of breath he did have palpitations a week ago, but not since. He is concerned however because he had a bump on his leg he thought it might be a blood clot. Nothing makes it better nothing makes it worse noticed today, not significantly tender unless he is touching it, no redness no fever no streaks from the area no other complaints He has an appointment with his cardiologist in less than 36 hours he states   Past Medical History:  Diagnosis Date  . Anxiety   . Chronic chest pain   . Chronic kidney disease (CKD), stage III (moderate)   . COPD (chronic obstructive pulmonary disease) ([place])   . Coronary artery disease    a. 12/2013 PCI: mRCA 100% with L to R collats s/p PCI/DES;  b. 06/2014 MV: no ischemia/infarct, EF 53%;  c. 03/2015 Cath: LM nl, LAD nl, D1 80 (1.6mm), LCX 68m (<1.27mm), OM1 nl, RCA 55p, patent stent, RPDA nl, EF 65%; d. 09/2015 Cath: LM nl, LAD 93m, D1 80 (<16mm), LCX 80m/d (<1.69mm), OM1 nl, RCA 55p (FFR 0.93), patent stent, RPDA nl-->Med Rx.  . Daily headache   . Depression   . Diastolic dysfunction    a. echo 12/2013: EF 55-60%, mild LVH, GR1DD, inf HK, elevated CVP, mildly dilated IVC suggestive of increased RA pressure  . GERD (gastroesophageal reflux disease)   . High cholesterol   . Hypertension   . Nicotine addiction    a. using eCigs.  . Sleep apnea     Patient Active Problem List   Diagnosis Date Noted  . Nicotine addiction   . CAD S/P PCI DES to RCA 03/11/2015  . Diastolic dysfunction   . Chronic chest pain   . Chest pain at rest 11/23/2014  . GERD (gastroesophageal reflux disease) 06/08/2014  . CKD (chronic kidney disease) stage 3, GFR 30-59 ml/min 05/04/2014  . Bilateral leg pain 05/04/2014  . Lung nodule < 6cm on CT 05/04/2014  . Abnormal CT scan, kidney 05/04/2014  . Unstable angina ([place]) 05/04/2014  . Atypical chest pain 03/22/2014  . Atrial tachycardia, paroxysmal ([place]) 12/14/2013  . Atherosclerosis of native coronary artery with unstable angina pectoris ([place]) 12/13/2013  . NSTEMI (non-ST elevated myocardial infarction) ([place]) 12/13/2013  . Labile essential hypertension 12/11/2013  . Acute renal failure superimposed on stage 3 chronic kidney disease ([place]) 12/11/2013  . Hyperlipidemia 12/11/2013  . COPD (chronic obstructive pulmonary disease) ([place]) 12/11/2013    Past Surgical History:  Procedure Laterality Date  . CARDIAC CATHETERIZATION     MC x 1 stent  . CARDIAC CATHETERIZATION Left 03/12/2015   Procedure: Left Heart Cath and Coronary Angiography;  Surgeon: Leonie Man, MD;  Location: [place] [facility];  Service: Cardiovascular;  Laterality: Left;  . CARDIAC CATHETERIZATION N/A 09/29/2015   Procedure: Left Heart Cath and Coronary Angiography;  Surgeon: Wellington Hampshire, MD;  Location: [place] [facility];  Service: Cardiovascular;  Laterality: N/A;  .  CORONARY ANGIOPLASTY    . LEFT HEART CATHETERIZATION WITH CORONARY ANGIOGRAM N/A 12/12/2013   Procedure: LEFT HEART CATHETERIZATION WITH CORONARY ANGIOGRAM;  Surgeon: Wellington Hampshire, MD;  Location: [place] [facility];  Service: Cardiovascular;  Laterality: N/A;    Prior to Admission medications   Medication Sig Start Date End Date Taking? Authorizing Provider  acetaminophen (TYLENOL) 500 MG tablet Take 1,000 mg by mouth daily as needed for headache.     [provider]  albuterol (PROVENTIL)  (2.5 MG/3ML) 0.083% nebulizer solution Take 3 mLs (2.5 mg total) by nebulization every 6 (six) hours as needed for wheezing or shortness of breath. 09/26/15   Gollan, Kathlene November, MD  ALPRAZolam (XANAX XR) 3 MG 24 hr tablet Take 3 mg by mouth every evening.     [provider]  ALPRAZolam Duanne Moron) 1 MG tablet Take 1 mg by mouth daily.  11/21/13   [provider]  amLODipine (NORVASC) 5 MG tablet Take 5 mg by mouth daily.    [provider]  aspirin 81 MG EC tablet Take 1 tablet (81 mg total) by mouth daily. 09/04/15   Minna Merritts, MD  atorvastatin (LIPITOR) 80 MG tablet Take 1 tablet (80 mg total) by mouth daily. 08/18/16   Minna Merritts, MD  budesonide-formoterol (SYMBICORT) 160-4.5 MCG/ACT inhaler Inhale 2 puffs into the lungs 2 (two) times daily. 09/04/15   Laverle Hobby, MD  ezetimibe (ZETIA) 10 MG tablet Take 1 tablet (10 mg total) by mouth daily. 06/14/16   Minna Merritts, MD  hydrALAZINE (APRESOLINE) 25 MG tablet Take 1 tablet (25 mg total) by mouth 3 (three) times daily as needed. 07/19/16   Minna Merritts, MD  metoprolol succinate (TOPROL-XL) 50 MG 24 hr tablet Take 1 tablet (50 mg total) by mouth daily. Take with or immediately following a meal. Patient taking differently: Take 25 mg by mouth daily. Take with or immediately following a meal. 07/28/16   Loletha Grayer, MD  montelukast (SINGULAIR) 10 MG tablet Take 10 mg by mouth at bedtime.    [provider]  NITROSTAT 0.4 MG SL tablet PLACE 1 TABLET UNDER TONGUE EVERY 5 MINUTES AS NEEDED FOR CHEST PAIN.CALL 911 AFTER 3RD TABLET. 11/14/15   Minna Merritts, MD  omeprazole (PRILOSEC) 40 MG capsule Take 1 capsule (40 mg total) by mouth daily. 06/28/16   Minna Merritts, MD  ranolazine (RANEXA) 500 MG 12 hr tablet Take 1 tablet (500 mg total) by mouth daily. Patient taking differently: Take 250 mg by mouth at bedtime.  07/27/16   Loletha Grayer, MD  roflumilast (DALIRESP) 500 MCG TABS tablet  Take 1 tablet (500 mcg total) by mouth daily. 09/04/15   Laverle Hobby, MD  senna-docusate (SENOKOT-S) 8.6-50 MG tablet Take 2 tablets by mouth daily as needed for mild constipation. Patient not taking: Reported on 11/01/2016 02/23/16 02/22/17  Eula Listen, MD  ticagrelor (BRILINTA) 60 MG TABS tablet Take 1 tablet (60 mg total) by mouth 2 (two) times daily. 08/09/16   Minna Merritts, MD  tiotropium (SPIRIVA) 18 MCG inhalation capsule Place 1 capsule (18 mcg total) into inhaler and inhale daily. 09/04/15   Laverle Hobby, MD  VENTOLIN HFA 108 (90 Base) MCG/ACT inhaler INHALE 1 TO 2 PUFFS INTO THE LUNGS EVERY 6 HOURS AS NEEDED FOR WHEEZING OR SHORTNESS OF BREATH. 03/25/16   Laverle Hobby, MD    Allergies Benzodiazepines; Paroxetine hcl; Serotonin reuptake inhibitors (ssris); Diazepam; Doxycycline monohydrate; Escitalopram oxalate; Isosorbide nitrate; Lorazepam; and Tetracyclines &  related  Family History  Problem Relation Age of Onset  . Coronary artery disease Brother   . Heart disease Brother 20  . Heart disease Father        MI x 8  . Kidney disease Neg Hx   . Prostate cancer Neg Hx     Social History Social History  Substance Use Topics  . Smoking status: Current Every Day Smoker    Years: 48.00    Types: E-cigarettes    Last attempt to quit: 06/07/2009  . Smokeless tobacco: Never Used     Comment: Using electronic cigarette  . Alcohol use 3.6 oz/week    6 Cans of beer per week     Comment: Occasional    Review of Systems Constitutional: No fever/chills Eyes: No visual changes. ENT: No sore throat. No stiff neck no neck pain Cardiovascular: Denies chest pain. Respiratory: Denies shortness of breath. Gastrointestinal:   no vomiting.  No diarrhea.  No constipation. Genitourinary: Negative for dysuria. Musculoskeletal: Negative lower extremity swelling Skin: Negative for rash. Neurological: Negative for severe headaches, focal weakness or  numbness.   ____________________________________________   PHYSICAL EXAM:  VITAL SIGNS: ED Triage Vitals  Enc Vitals Group     BP 11/06/16 1730 (!) 149/87     Pulse Rate 11/06/16 1730 99     Resp 11/06/16 1730 18     Temp 11/06/16 1730 98 F (36.7 C)     Temp Source 11/06/16 1730 Oral     SpO2 11/06/16 1730 99 %     Weight 11/06/16 1730 194 lb (88 kg)     Height 11/06/16 1730 5\' 9"  (1.753 m)     Head Circumference --      Peak Flow --      Pain Score 11/06/16 1739 0     Pain Loc --      Pain Edu? --      Excl. in [place]? --     Constitutional: Alert and oriented. Well appearing and in no acute distress. Eyes: Conjunctivae are normal Head: Atraumatic HEENT: No congestion/rhinnorhea. Mucous membranes are moist.  Oropharynx non-erythematous Neck:   Nontender with no meningismus, no masses, no stridor Cardiovascular: Normal rate, regular rhythm. Grossly normal heart sounds.  Good peripheral circulation. Respiratory: Normal respiratory effort.  No retractions. Lungs CTAB. Abdominal: Soft and nontender. No distention. No guarding no rebound Back:  There is no focal tenderness or step off.  there is no midline tenderness there are no lesions noted. there is no CVA tenderness Musculoskeletal: No lower extremity tenderness, no upper extremity tenderness. No joint effusions, no DVT signs strong distal pulses no edema, there is in the left calf approximately a 1.5 x 1 cm area of mild induration and tenderness. It appears to be a bruise. Does not appear to be infected is not red is not markedly tender to touch there is no streaks and there is not hot Neurologic:  Normal speech and language. No gross focal neurologic deficits are appreciated.  Skin:  Skin is warm, dry and intact. Full bruises noted. Psychiatric: Mood and affect are normal. Speech and behavior are normal.  ____________________________________________   LABS (all labs ordered are listed, but only abnormal results are  displayed)  Labs Reviewed  BASIC METABOLIC PANEL - Abnormal; Notable for the following:       Result Value   Glucose, Bld 103 (*)    Creatinine, Ser 1.62 (*)    GFR calc non Af Amer 42 (*)  GFR calc Af Amer 49 (*)    All other components within normal limits  CBC  TROPONIN I  BRAIN NATRIURETIC PEPTIDE   ____________________________________________  EKG  I personally interpreted any EKGs ordered by me or triage Normal sinus rhythm at 94 bpm no acute ST elevation or acute ST depression normal axis unremarkable EKG ____________________________________________  RADIOLOGY  I reviewed any imaging ordered by me or triage that were performed during my shift and, if possible, patient and/or family made aware of any abnormal findings. ____________________________________________   PROCEDURES  Procedure(s) performed: None  Procedures  Critical Care performed: None  ____________________________________________   INITIAL IMPRESSION / ASSESSMENT AND PLAN / ED COURSE  Pertinent labs & imaging results that were available during my care of the patient were reviewed by me and considered in my medical decision making (see chart for details).  With a very small area of induration to his calf which appears to be a small bruise. Patient bruises easily, he is on anticoagulation medication. However fortunately there is no evidence of DVT. Ultrasound is negative. We did also send a troponin although he has not had any chest discomfort last week and at that time was only palpitations, that is, as expected negative, he has no ongoing symptoms. He is very reassured that he is not having a DVT from the small little area of concern in his leg which is consistent clinically and by ultrasonography With a small hematoma. Return precautions for given and understood. Patient will see his cardiologist next few days.   ____________________________________________   FINAL CLINICAL IMPRESSION(S) / ED  DIAGNOSES  Final diagnoses:  Bilateral leg pain  Right leg swelling      This chart was dictated using voice recognition software.  Despite best efforts to proofread,  errors can occur which can change meaning.      Schuyler Amor, MD 11/06/16 2141

## 2016-11-06 NOTE — ED Triage Notes (Addendum)
Pt reports right leg pain and feeling a knot on the back of his calf, also worried about bruise to the back of left calf. States sometimes his legs swell. Was seen here on Monday for chest pain and will see cards on Monday.  Pt is on Brillinta. Pt c/o feeling short of breath and also a brief episode of central chest pain.

## 2016-11-07 NOTE — Progress Notes (Signed)
Cardiology Office Note  Date:  11/08/2016   ID:  William Mueller, DOB [birth year], MRN [id number]  PCP:  Leonides Sake, MD   Chief Complaint  Patient presents with  . other    Follow up from [facility] ER last Monday for chest pain and this past Saturday was at the ER with possible DVT. Meds reviewed by the pt. verbally. Pt. c/o LE edema, shortness of breath, chest pain and pain in calves with bruising.     HPI:  [name] is a very pleasant 66 year old gentleman with  long history of smoking, COPD,  chronic renal insufficiency, CR 1.7  12/11/2013 to [place] with chest pain cardiac catheterization lab : occluded mid RCA. Stent was placed.  Repeat catheterization in October 2016 with patent stent,disease of a small diagonal and distal circumflex not amenable to intervention.  History of paroxysmal atrial tachycardia He presents today for follow-up of his coronary artery disease And COPD  hospital admission  July 26 2016 discharged February 20  admitted for chest pain, atypical Had adjustments to his blood pressure medications Seen by cardiology in the hospital, felt it was atypical Possible transient angina in the setting of uncontrolled hypertension Beta blocker dose was increased  Seen in the emergency room 11/01/2016 for chest pain Atypical in nature  Seen in the emergency room 11/06/2016 for leg pain Small area bilateral was tender, small bruising  Ultrasound of leg and was negative. D-dimer not sent. Troponin negative (unclear why this was sent)  dog jumps up on his bed at night also crosses his legs at night when he sleeps Suspect he may have bruised his legs  On visit today he reports having tingling and numbness in his feet He is using e-cigarette, tried to quit. Had withdrawal symptoms Has periodic Foot swelling, etiology unclear  EKG personally reviewed by myself on todays visit Normal sinus rhythm with rate 77 bpm no significant ST or T-wave changes   other  past medical history reviewed   previously with Significant stress at home Problems with his truck, may need a new transmission He feels his problems are secondary to stress pushing his blood pressure up and down Prior history of GERD Previously reported blood pressure higher in the afternoon  In the ER 02/2016 ABD pain. He still does not know the cause of his pain  EMS called to his house several months ago HR 147, on couch, did not feel right Sinus tach on EKG he provided to [place] today  Felt he was having a panic attack Talked with EMS and heart rate improved No further episodes since that time  He reports having periodic tachycardia when he wakes up in the morning with associated hypoxemia. Numbers on his pulse oximeter are in the 80s  does not use oxygen at nighttime to sleep  April 2017 went to the emergency room with chest tightness, kept in the hospital, refused stress test, had cardiac catheterization showing patent stent, disease was relatively unchanged Date of catheterization 09/29/2015 Echocardiogram confirmed ejection fraction 65%  Previously did a PFT, technician reported that he passed out. He does not remember any significant difficulty but the test was abruptly stopped  Unable to tolerate isosorbide secondary to headache previously total cholesterol was 200  he presented to the hospital 02/26/2014 with chest pain. He seen by cardiology, rule out for MI, had normal EKG and discharged home. Echocardiogram July 2015 showed ejection fraction 55-60%  Creatinine in 10/22/2013 was 2.1  PMH:   has  a past medical history of Anxiety; Chronic chest pain; Chronic kidney disease (CKD), stage III (moderate); COPD (chronic obstructive pulmonary disease) ([place]); Coronary artery disease; Daily headache; Depression; Diastolic dysfunction; GERD (gastroesophageal reflux disease); High cholesterol; Hypertension; Nicotine addiction; and Sleep apnea.  PSH:    Past Surgical  History:  Procedure Laterality Date  . CARDIAC CATHETERIZATION     MC x 1 stent  . CARDIAC CATHETERIZATION Left 03/12/2015   Procedure: Left Heart Cath and Coronary Angiography;  Surgeon: Leonie Man, MD;  Location: [place] [facility];  Service: Cardiovascular;  Laterality: Left;  . CARDIAC CATHETERIZATION N/A 09/29/2015   Procedure: Left Heart Cath and Coronary Angiography;  Surgeon: Wellington Hampshire, MD;  Location: [place] [facility];  Service: Cardiovascular;  Laterality: N/A;  . CORONARY ANGIOPLASTY    . LEFT HEART CATHETERIZATION WITH CORONARY ANGIOGRAM N/A 12/12/2013   Procedure: LEFT HEART CATHETERIZATION WITH CORONARY ANGIOGRAM;  Surgeon: Wellington Hampshire, MD;  Location: [place] [facility];  Service: Cardiovascular;  Laterality: N/A;    Current Outpatient Prescriptions  Medication Sig Dispense Refill  . acetaminophen (TYLENOL) 500 MG tablet Take 1,000 mg by mouth daily as needed for headache.     . albuterol (PROVENTIL) (2.5 MG/3ML) 0.083% nebulizer solution Take 3 mLs (2.5 mg total) by nebulization every 6 (six) hours as needed for wheezing or shortness of breath. 75 mL 12  . ALPRAZolam (XANAX XR) 3 MG 24 hr tablet Take 3 mg by mouth every evening.     Marland Kitchen ALPRAZolam (XANAX) 1 MG tablet Take 1 mg by mouth daily.     Marland Kitchen amLODipine (NORVASC) 5 MG tablet Take 5 mg by mouth daily.    Marland Kitchen aspirin 81 MG EC tablet Take 1 tablet (81 mg total) by mouth daily. 90 tablet 3  . atorvastatin (LIPITOR) 80 MG tablet Take 1 tablet (80 mg total) by mouth daily. 90 tablet 3  . budesonide-formoterol (SYMBICORT) 160-4.5 MCG/ACT inhaler Inhale 2 puffs into the lungs 2 (two) times daily. 3 Inhaler 1  . ezetimibe (ZETIA) 10 MG tablet Take 1 tablet (10 mg total) by mouth daily. 90 tablet 3  . hydrALAZINE (APRESOLINE) 25 MG tablet Take 1 tablet (25 mg total) by mouth 3 (three) times daily as needed. 270 tablet 3  . metoprolol succinate (TOPROL-XL) 50 MG 24 hr tablet Take 1 tablet (50 mg total) by mouth daily. Take  with or immediately following a meal. (Patient taking differently: Take 25 mg by mouth daily. Take with or immediately following a meal.) 30 tablet 0  . montelukast (SINGULAIR) 10 MG tablet Take 10 mg by mouth at bedtime.    Marland Kitchen NITROSTAT 0.4 MG SL tablet PLACE 1 TABLET UNDER TONGUE EVERY 5 MINUTES AS NEEDED FOR CHEST PAIN.CALL 911 AFTER 3RD TABLET. 25 tablet 1  . omeprazole (PRILOSEC) 40 MG capsule Take 1 capsule (40 mg total) by mouth daily. 90 capsule 1  . ranolazine (RANEXA) 500 MG 12 hr tablet Take 1 tablet (500 mg total) by mouth daily. (Patient taking differently: Take 250 mg by mouth at bedtime. )    . roflumilast (DALIRESP) 500 MCG TABS tablet Take 1 tablet (500 mcg total) by mouth daily. 90 tablet 1  . senna-docusate (SENOKOT-S) 8.6-50 MG tablet Take 2 tablets by mouth daily as needed for mild constipation. 30 tablet 0  . ticagrelor (BRILINTA) 60 MG TABS tablet Take 1 tablet (60 mg total) by mouth 2 (two) times daily. 180 tablet 3  . tiotropium (SPIRIVA) 18 MCG inhalation  capsule Place 1 capsule (18 mcg total) into inhaler and inhale daily. 90 capsule 1  . VENTOLIN HFA 108 (90 Base) MCG/ACT inhaler INHALE 1 TO 2 PUFFS INTO THE LUNGS EVERY 6 HOURS AS NEEDED FOR WHEEZING OR SHORTNESS OF BREATH. 54 g 0   No current facility-administered medications for this visit.      Allergies:   Benzodiazepines; Paroxetine hcl; Serotonin reuptake inhibitors (ssris); Diazepam; Doxycycline monohydrate; Escitalopram oxalate; Isosorbide nitrate; Lorazepam; and Tetracyclines & related   Social History:  The patient  reports that he has been smoking E-cigarettes.  He has smoked for the past 48.00 years. He has never used smokeless tobacco. He reports that he drinks about 3.6 oz of alcohol per week . He reports that he does not use drugs.   Family History:   family history includes Coronary artery disease in his brother; Heart disease in his father; Heart disease (age of onset: 20) in his brother.    Review of  Systems: Review of Systems  Constitutional: Negative.   Respiratory: Negative.   Cardiovascular: Positive for chest pain.  Gastrointestinal: Negative.   Musculoskeletal: Negative.   Skin:       Bruising on his calfs b/l  Neurological: Negative.   Psychiatric/Behavioral: Negative.   All other systems reviewed and are negative.    PHYSICAL EXAM: VS:  BP 130/80 (BP Location: Left Arm, Patient Position: Sitting, Cuff Size: Normal)   Pulse 78   Ht 5\' 9"  (1.753 m)   Wt 195 lb 12 oz (88.8 kg)   BMI 28.91 kg/m  , BMI Body mass index is 28.91 kg/m. GEN: Well nourished, well developed, in no acute distress  HEENT: normal  Neck: no JVD, carotid bruits, or masses Cardiac: RRR; no murmurs, rubs, or gallops,no edema  Respiratory:  Mildly decreased lung sounds throughout, normal work of breathing GI: soft, nontender, nondistended, + BS MS: no deformity or atrophy  Skin: warm and dry, no rash Neuro:  Strength and sensation are intact Psych: euthymic mood, full affect    Recent Labs: 02/23/2016: ALT 21 11/06/2016: B Natriuretic Peptide 21.0; BUN 15; Creatinine, Ser 1.62; Hemoglobin 14.2; Platelets 286; Potassium 4.3; Sodium 136    Lipid Panel Lab Results  Component Value Date   CHOL 106 07/27/2016   HDL 40 (L) 07/27/2016   LDLCALC 42 07/27/2016   TRIG 118 07/27/2016      Wt Readings from Last 3 Encounters:  11/08/16 195 lb 12 oz (88.8 kg)  11/06/16 194 lb (88 kg)  11/01/16 194 lb (88 kg)       ASSESSMENT AND PLAN:  Essential hypertension - Plan: EKG 12-Lead Blood pressure is well controlled on today's visit. No changes made to the medications.  Mixed hyperlipidemia - Plan: EKG 12-Lead Cholesterol is at goal on the current lipid regimen. No changes to the medications were made.  Atrial tachycardia, paroxysmal (HCC) - Plan: EKG 12-Lead Denies any further episodes of tachycardia Previous symptoms possibly exacerbated by anxiety/stress Recommended he take extra  metoprolol as needed for tachycardia   CAD S/P PCI DES to RCA - Plan: EKG 12-Lead Recent atypical chest pain, no testing performed in the hospital. Currently with no symptoms of angina. No further workup at this time.  Having significant problems with bruising. Recommended he stop the brilinta Stay on low-dose aspirin He is bothered by the cost of the ranexa. Only taking 250 mg daily. Likely not doing very much and recommended he stop the medication  Anxiety/stress Will defer management to primary  care    Total encounter time more than 25 minutes  Greater than 50% was spent in counseling and coordination of care with the patient   Disposition:   F/U  12 months  No orders of the defined types were placed in this encounter.    Signed, Esmond Plants, M.D., Ph.D. 11/08/2016  [place], [place]

## 2016-11-08 ENCOUNTER — Encounter: Payer: Self-pay | Admitting: Cardiovascular Disease

## 2016-11-08 ENCOUNTER — Ambulatory Visit (INDEPENDENT_AMBULATORY_CARE_PROVIDER_SITE_OTHER): Payer: PPO | Admitting: Cardiovascular Disease

## 2016-11-08 VITALS — BP 130/80 | HR 78 | Ht 69.0 in | Wt 195.8 lb

## 2016-11-08 DIAGNOSIS — J432 Centrilobular emphysema: Secondary | ICD-10-CM

## 2016-11-08 DIAGNOSIS — I25118 Atherosclerotic heart disease of native coronary artery with other forms of angina pectoris: Secondary | ICD-10-CM

## 2016-11-08 DIAGNOSIS — R079 Chest pain, unspecified: Secondary | ICD-10-CM | POA: Diagnosis not present

## 2016-11-08 DIAGNOSIS — F17298 Nicotine dependence, other tobacco product, with other nicotine-induced disorders: Secondary | ICD-10-CM

## 2016-11-08 DIAGNOSIS — I471 Supraventricular tachycardia: Secondary | ICD-10-CM | POA: Diagnosis not present

## 2016-11-08 DIAGNOSIS — N183 Chronic kidney disease, stage 3 unspecified: Secondary | ICD-10-CM

## 2016-11-08 DIAGNOSIS — I4719 Other supraventricular tachycardia: Secondary | ICD-10-CM

## 2016-11-08 DIAGNOSIS — E782 Mixed hyperlipidemia: Secondary | ICD-10-CM

## 2016-11-08 DIAGNOSIS — G8929 Other chronic pain: Secondary | ICD-10-CM

## 2016-11-08 NOTE — Patient Instructions (Addendum)
Medication Instructions:   Ok to wean off brilinta Ok to wean off ranexa after the brilinta Labwork:  No new labs needed  Testing/Procedures:  No further testing at this time   I recommend watching educational videos on topics of interest to you at:       [email or web address]  Enter code: [email or web address]    Follow-Up: It was a pleasure seeing you in the office today. Please call us if you have new issues that need to be addressed before your next appt.  [phone]  Your physician wants you to follow-up in: 12 months.  You will receive a reminder letter in the mail two months in advance. If you don't receive a letter, please call our office to schedule the follow-up appointment.  If you need a refill on your cardiac medications before your next appointment, please call your pharmacy.

## 2016-11-10 DIAGNOSIS — R5382 Chronic fatigue, unspecified: Secondary | ICD-10-CM | POA: Diagnosis not present

## 2016-11-10 DIAGNOSIS — N183 Chronic kidney disease, stage 3 (moderate): Secondary | ICD-10-CM | POA: Diagnosis not present

## 2016-11-10 DIAGNOSIS — I129 Hypertensive chronic kidney disease with stage 1 through stage 4 chronic kidney disease, or unspecified chronic kidney disease: Secondary | ICD-10-CM | POA: Diagnosis not present

## 2016-11-10 DIAGNOSIS — E559 Vitamin D deficiency, unspecified: Secondary | ICD-10-CM | POA: Diagnosis not present

## 2016-11-10 DIAGNOSIS — R809 Proteinuria, unspecified: Secondary | ICD-10-CM | POA: Diagnosis not present

## 2016-11-10 DIAGNOSIS — N2581 Secondary hyperparathyroidism of renal origin: Secondary | ICD-10-CM | POA: Diagnosis not present

## 2016-11-19 DIAGNOSIS — Z7689 Persons encountering health services in other specified circumstances: Secondary | ICD-10-CM | POA: Diagnosis not present

## 2016-11-19 DIAGNOSIS — R600 Localized edema: Secondary | ICD-10-CM | POA: Diagnosis not present

## 2016-11-25 DIAGNOSIS — J449 Chronic obstructive pulmonary disease, unspecified: Secondary | ICD-10-CM | POA: Diagnosis not present

## 2016-11-26 DIAGNOSIS — Z1211 Encounter for screening for malignant neoplasm of colon: Secondary | ICD-10-CM | POA: Diagnosis not present

## 2016-11-26 DIAGNOSIS — Z125 Encounter for screening for malignant neoplasm of prostate: Secondary | ICD-10-CM | POA: Diagnosis not present

## 2016-11-26 DIAGNOSIS — Z1389 Encounter for screening for other disorder: Secondary | ICD-10-CM | POA: Diagnosis not present

## 2016-11-26 DIAGNOSIS — Z136 Encounter for screening for cardiovascular disorders: Secondary | ICD-10-CM | POA: Diagnosis not present

## 2016-11-26 DIAGNOSIS — Z9181 History of falling: Secondary | ICD-10-CM | POA: Diagnosis not present

## 2016-11-26 DIAGNOSIS — E785 Hyperlipidemia, unspecified: Secondary | ICD-10-CM | POA: Diagnosis not present

## 2016-11-26 DIAGNOSIS — Z0000 Encounter for general adult medical examination without abnormal findings: Secondary | ICD-10-CM | POA: Diagnosis not present

## 2016-11-30 DIAGNOSIS — H353211 Exudative age-related macular degeneration, right eye, with active choroidal neovascularization: Secondary | ICD-10-CM | POA: Diagnosis not present

## 2016-12-28 ENCOUNTER — Other Ambulatory Visit: Payer: Self-pay | Admitting: Cardiovascular Disease

## 2016-12-29 DIAGNOSIS — R0902 Hypoxemia: Secondary | ICD-10-CM | POA: Diagnosis not present

## 2016-12-29 DIAGNOSIS — R0602 Shortness of breath: Secondary | ICD-10-CM | POA: Diagnosis not present

## 2016-12-29 DIAGNOSIS — J449 Chronic obstructive pulmonary disease, unspecified: Secondary | ICD-10-CM | POA: Diagnosis not present

## 2016-12-30 ENCOUNTER — Ambulatory Visit: Payer: PPO | Admitting: Pharmacy Technician

## 2016-12-30 DIAGNOSIS — Z6829 Body mass index (BMI) 29.0-29.9, adult: Secondary | ICD-10-CM | POA: Diagnosis not present

## 2016-12-30 DIAGNOSIS — N183 Chronic kidney disease, stage 3 (moderate): Secondary | ICD-10-CM | POA: Diagnosis not present

## 2016-12-30 DIAGNOSIS — F419 Anxiety disorder, unspecified: Secondary | ICD-10-CM | POA: Diagnosis not present

## 2016-12-30 DIAGNOSIS — E782 Mixed hyperlipidemia: Secondary | ICD-10-CM | POA: Diagnosis not present

## 2016-12-30 DIAGNOSIS — J449 Chronic obstructive pulmonary disease, unspecified: Secondary | ICD-10-CM | POA: Diagnosis not present

## 2016-12-30 DIAGNOSIS — I10 Essential (primary) hypertension: Secondary | ICD-10-CM | POA: Diagnosis not present

## 2016-12-30 DIAGNOSIS — Z79899 Other long term (current) drug therapy: Secondary | ICD-10-CM

## 2016-12-31 NOTE — Progress Notes (Signed)
  Completed [facility] application and contract.  Patient agreed to all terms of the [facility] Medicare Part D contract.  Patient to provide LIS denial letter.  Patient approved till 06/07/2017.  Patient understands that he will need to obtain his medications beginning 06/07/2017 using his Medicare Part D insurance.  Referred patient for MTM.  [facility]

## 2017-01-04 ENCOUNTER — Other Ambulatory Visit: Payer: Self-pay | Admitting: Cardiovascular Disease

## 2017-01-04 DIAGNOSIS — H353211 Exudative age-related macular degeneration, right eye, with active choroidal neovascularization: Secondary | ICD-10-CM | POA: Diagnosis not present

## 2017-01-04 NOTE — Telephone Encounter (Signed)
Amlodipine 10 mg tablet refill request. Pt has amlodipine 5 mg tablet on medications list please advise If ok to refill.

## 2017-01-04 NOTE — Telephone Encounter (Signed)
Please see note below. 

## 2017-01-05 DIAGNOSIS — J449 Chronic obstructive pulmonary disease, unspecified: Secondary | ICD-10-CM | POA: Diagnosis not present

## 2017-01-17 ENCOUNTER — Telehealth: Payer: Self-pay | Admitting: Pharmacist

## 2017-01-17 NOTE — Telephone Encounter (Signed)
01/17/17 Medicare D patient in coverage gap. I have today faxed the following applications: [employer] for Ventolin HFA 90 mcg Inhale 2 puffs up to every 4 hours as needed for shortness of breath. [employer] for [employer] 18 mcg Inhale contents of one capsule using inhaler once daily [employer] for [employer] 500 mcg Take one tablet by mouth daily [employer] for Symbicort 160/4.5 mcg Inhale 2 puffs into the lungs twice a day.AJ  I have not yet received signed provider section back for Nitrostat & Zetia.[name]

## 2017-01-31 ENCOUNTER — Encounter: Payer: Self-pay | Admitting: Pharmacist

## 2017-01-31 ENCOUNTER — Ambulatory Visit: Payer: PPO | Admitting: Pharmacist

## 2017-01-31 VITALS — BP 128/78 | Ht 69.0 in | Wt 196.0 lb

## 2017-01-31 DIAGNOSIS — Z79899 Other long term (current) drug therapy: Secondary | ICD-10-CM

## 2017-01-31 NOTE — Progress Notes (Signed)
[facility] Visit Note  Patient: William Mueller MRN: [id number] Date of Birth: July 29, 1950 PCP: Philmore Pali, NP   [name] 66 y.o. male presents for an annual Medication Therapy Management visit with the pharmacist. He is in the coverage gap with his current Medicare plan. [facility] is ordering Ventolin HFA, Spiriva, Symbicort, Zetia, Daliresp and Nitrostat from the patient assistance programs.  BP 128/78 (BP Location: Right Arm, Patient Position: Sitting, Cuff Size: Large)   Ht 5\' 9"  (1.753 m)   Wt 196 lb (88.9 kg)   BMI 28.94 kg/m   Patient Information   Past Medical History:  Diagnosis Date  . Anxiety   . Chronic chest pain   . Chronic kidney disease (CKD), stage III (moderate)   . COPD (chronic obstructive pulmonary disease) ([place])   . Coronary artery disease    a. 12/2013 PCI: mRCA 100% with L to R collats s/p PCI/DES;  b. 06/2014 MV: no ischemia/infarct, EF 53%;  c. 03/2015 Cath: LM nl, LAD nl, D1 80 (1.43mm), LCX 80m (<1.20mm), OM1 nl, RCA 55p, patent stent, RPDA nl, EF 65%; d. 09/2015 Cath: LM nl, LAD 73m, D1 80 (<32mm), LCX 42m/d (<1.45mm), OM1 nl, RCA 55p (FFR 0.93), patent stent, RPDA nl-->Med Rx.  . Daily headache   . Depression   . Diastolic dysfunction    a. echo 12/2013: EF 55-60%, mild LVH, GR1DD, inf HK, elevated CVP, mildly dilated IVC suggestive of increased RA pressure  . GERD (gastroesophageal reflux disease)   . High cholesterol   . Hypertension   . Nicotine addiction    a. using eCigs.  . Sleep apnea       Past Surgical History:  Procedure Laterality Date  . CARDIAC CATHETERIZATION     MC x 1 stent  . CARDIAC CATHETERIZATION Left 03/12/2015   Procedure: Left Heart Cath and Coronary Angiography;  Surgeon: Leonie Man, MD;  Location: [facility];  Service: Cardiovascular;  Laterality: Left;  . CARDIAC CATHETERIZATION N/A 09/29/2015   Procedure: Left Heart Cath and Coronary Angiography;  Surgeon: Wellington Hampshire, MD;  Location: [facility];  Service: Cardiovascular;  Laterality: N/A;  . CORONARY ANGIOPLASTY    . LEFT HEART CATHETERIZATION WITH CORONARY ANGIOGRAM N/A 12/12/2013   Procedure: LEFT HEART CATHETERIZATION WITH CORONARY ANGIOGRAM;  Surgeon: Wellington Hampshire, MD;  Location: [facility];  Service: Cardiovascular;  Laterality: N/A;     Family History  Problem Relation Age of Onset  . Coronary artery disease Brother   . Heart disease Brother 27  . Heart disease Father        MI x 8  . Kidney disease Neg Hx   . Prostate cancer Neg Hx     New Diagnoses (since last visit):   Family Support: Good  Lifestyle Diet: Breakfast: sausage egg and cheese buscuit  Lunch: salad from [employer]: 2 chili cheese dogs without the weenie  Drinks:         History  Alcohol Use  . 3.6 oz/week  . 6 Cans of beer per week    Comment: Occasional      History  Smoking Status  . Current Every Day Smoker  . Years: 48.00  . Types: E-cigarettes  . Last attempt to quit: 06/07/2009  Smokeless Tobacco  . Never Used    Comment: Using electronic cigarette      Health Maintenance  Topic Date Due  . Hepatitis C Screening  04/24/1950  . TETANUS/TDAP  07/09/1968  . COLONOSCOPY  07/10/1999  . PNA vac Low Risk Adult (1 of 2 - PCV13) 07/09/2014  . INFLUENZA VACCINE  01/05/2017     Prior to Admission medications   Medication Sig Start Date End Date Taking? Authorizing Provider  acetaminophen (TYLENOL) 500 MG tablet Take 1,000 mg by mouth daily as needed for headache.    Yes [provider]  albuterol (PROVENTIL) (2.5 MG/3ML) 0.083% nebulizer solution Take 3 mLs (2.5 mg total) by nebulization every 6 (six) hours as needed for wheezing or shortness of breath. 09/26/15  Yes Gollan, Kathlene November, MD  ALPRAZolam (XANAX XR) 3 MG 24 hr tablet Take 3 mg by mouth every evening.    Yes [provider]  ALPRAZolam Duanne Moron) 1 MG tablet Take 1 mg by mouth daily.  11/21/13  Yes  [provider]  amLODipine (NORVASC) 5 MG tablet Take 1 tablet (5 mg total) by mouth daily. 01/04/17  Yes Minna Merritts, MD  aspirin 81 MG EC tablet Take 1 tablet (81 mg total) by mouth daily. 09/04/15  Yes Minna Merritts, MD  atorvastatin (LIPITOR) 80 MG tablet Take 1 tablet (80 mg total) by mouth daily. 08/18/16  Yes Minna Merritts, MD  budesonide-formoterol (SYMBICORT) 160-4.5 MCG/ACT inhaler Inhale 2 puffs into the lungs 2 (two) times daily. 09/04/15  Yes Laverle Hobby, MD  furosemide (LASIX) 20 MG tablet Take 20 mg by mouth daily. 1qam   Yes [provider]  hydrALAZINE (APRESOLINE) 25 MG tablet Take 1 tablet (25 mg total) by mouth 3 (three) times daily as needed. 07/19/16  Yes Gollan, Kathlene November, MD  metoprolol succinate (TOPROL-XL) 50 MG 24 hr tablet TAKE ONE TABLET BY MOUTH EVERY DAY. TAKEWITH OR IMMEDIATELY FOLLOWINGA MEAL 12/28/16  Yes Gollan, Kathlene November, MD  NITROSTAT 0.4 MG SL tablet PLACE 1 TABLET UNDER TONGUE EVERY 5 MINUTES AS NEEDED FOR CHEST PAIN.CALL 911 AFTER 3RD TABLET. 11/14/15  Yes Gollan, Kathlene November, MD  omeprazole (PRILOSEC) 40 MG capsule Take 1 capsule (40 mg total) by mouth daily. 06/28/16  Yes Gollan, Kathlene November, MD  roflumilast (DALIRESP) 500 MCG TABS tablet Take 1 tablet (500 mcg total) by mouth daily. 09/04/15  Yes Laverle Hobby, MD  tiotropium (SPIRIVA) 18 MCG inhalation capsule Place 1 capsule (18 mcg total) into inhaler and inhale daily. 09/04/15  Yes Laverle Hobby, MD  VENTOLIN HFA 108 (90 Base) MCG/ACT inhaler INHALE 1 TO 2 PUFFS INTO THE LUNGS EVERY 6 HOURS AS NEEDED FOR WHEEZING OR SHORTNESS OF BREATH. 03/25/16  Yes Laverle Hobby, MD  ezetimibe (ZETIA) 10 MG tablet Take 1 tablet (10 mg total) by mouth daily. Patient not taking: Reported on 01/31/2017 06/14/16   Minna Merritts, MD    Health Maintenance/Date Completed  Last ED visit: 11/2016 Last Visit to PCP: NP, Charlott Holler- 11/2016 Next Visit to PCP: 12/18 Specialist  Visit: heart (Dr. Rockey Situ) and lung (Dr. Raul Del)  Dental Exam: non recent Eye Exam: 2018 Prostate Exam: 2017 ? Pelvic/PAP Exam: n/a Mammogram: n/a DEXA: n/a Colonoscopy: 2017 Flu Vaccine: 2017 Pneumonia Vaccine: 2017  Assessment and Plan:  Compliance:  Patient takes medications as prescribed per report. His wife sets up his pill box for him and takes care of the medications.  Cardiac:  Patient sees Dr. Rockey Situ. Blood pressure within goal of < 130/80 mg/dl. Currently on amlodipine, hydralazine (as needed for DBP > 90 mmHg) and metoprolol XL.  Lipid panel (07/27/16): TC = 106 mg/dl, TG =  118 mg/dl, HDL = 40 mg/dl, LDL = 42 mg/dl. Currently prescribed atorvastatin and ezetimibe (pt has been out of the ezetimibe, [facility] is ordering this through patient assistance). Patient states that the Brilinta and Ranexa were discontinued. Hx of stent placement 12/11/13.  COPD:  Patient sees Dr. Raul Del. Currently on Symbicort, Spiriva, Ventolin HFA, albuterol nebulizer, and Daliresp. Patient was put on oxygen two months ago. Has a portable machine; using 2 L. States this regimen is "good". Feels like the Daliresp is a "miracle" drug.  Chronic Kidney Disease:  Patient sees NP, Marketing executive at [facility]. Scr = 1.62 mg/dl 11/25/16  GERD: On omeprazole.  Anxiety: On alprazolam 1 mg in the morning and 3 mg XR in the evening. Patient states this helps significantly when he is worried about his breathing. Medical record lists allergy to benzodiazepines; this is unclear as he has been on alprazolam for year, per patient report.  Patient's Medicare prescription coverage will be active as of 06/07/17.  Ka Flammer K. Dicky Doe, PharmD [facility] [place] Operations Coordinator [phone]

## 2017-02-03 ENCOUNTER — Telehealth: Payer: Self-pay | Admitting: Pharmacist

## 2017-02-03 NOTE — Telephone Encounter (Signed)
02/03/17 Faxed Pfizer application for Nitrostat 0.4 mg Dissolve one tablet under the tongue every 5 minutes as needed for chest pain. Do not exceed a total of three doses in 15 minutes. Call 911 after 3rd tablet.[name]

## 2017-02-05 DIAGNOSIS — J449 Chronic obstructive pulmonary disease, unspecified: Secondary | ICD-10-CM | POA: Diagnosis not present

## 2017-02-15 DIAGNOSIS — H353211 Exudative age-related macular degeneration, right eye, with active choroidal neovascularization: Secondary | ICD-10-CM | POA: Diagnosis not present

## 2017-02-25 ENCOUNTER — Telehealth: Payer: Self-pay | Admitting: Pharmacist

## 2017-02-25 NOTE — Telephone Encounter (Signed)
02/25/17 Picked up form from provider office-Mailing [employer] application for [employer] 10mg  Take 1 tablet daily. [name]

## 2017-03-01 ENCOUNTER — Other Ambulatory Visit: Payer: Self-pay | Admitting: Cardiovascular Disease

## 2017-03-03 ENCOUNTER — Telehealth: Payer: Self-pay | Admitting: Pharmacist

## 2017-03-03 NOTE — Telephone Encounter (Signed)
03/03/17 Called [employer] to place refill on Spiriva, will release order 03/20/17, allow 7-10 business days from 03/20/17 to receive medication.[name]

## 2017-03-07 DIAGNOSIS — J449 Chronic obstructive pulmonary disease, unspecified: Secondary | ICD-10-CM | POA: Diagnosis not present

## 2017-03-09 ENCOUNTER — Encounter: Payer: Self-pay | Admitting: Emergency Medicine

## 2017-03-09 ENCOUNTER — Other Ambulatory Visit: Payer: Self-pay

## 2017-03-09 ENCOUNTER — Emergency Department
Admission: EM | Admit: 2017-03-09 | Discharge: 2017-03-09 | Disposition: A | Discharge status: Home / Self Care | Payer: PPO | Attending: Emergency Medicine | Admitting: Emergency Medicine

## 2017-03-09 ENCOUNTER — Emergency Department: Payer: PPO

## 2017-03-09 DIAGNOSIS — I129 Hypertensive chronic kidney disease with stage 1 through stage 4 chronic kidney disease, or unspecified chronic kidney disease: Secondary | ICD-10-CM | POA: Insufficient documentation

## 2017-03-09 DIAGNOSIS — M7989 Other specified soft tissue disorders: Secondary | ICD-10-CM | POA: Diagnosis not present

## 2017-03-09 DIAGNOSIS — N183 Chronic kidney disease, stage 3 (moderate): Secondary | ICD-10-CM | POA: Diagnosis not present

## 2017-03-09 DIAGNOSIS — J449 Chronic obstructive pulmonary disease, unspecified: Secondary | ICD-10-CM | POA: Insufficient documentation

## 2017-03-09 DIAGNOSIS — I25110 Atherosclerotic heart disease of native coronary artery with unstable angina pectoris: Secondary | ICD-10-CM | POA: Insufficient documentation

## 2017-03-09 DIAGNOSIS — F17290 Nicotine dependence, other tobacco product, uncomplicated: Secondary | ICD-10-CM | POA: Diagnosis not present

## 2017-03-09 DIAGNOSIS — R600 Localized edema: Secondary | ICD-10-CM | POA: Diagnosis not present

## 2017-03-09 DIAGNOSIS — J439 Emphysema, unspecified: Secondary | ICD-10-CM | POA: Diagnosis not present

## 2017-03-09 DIAGNOSIS — I10 Essential (primary) hypertension: Secondary | ICD-10-CM | POA: Diagnosis not present

## 2017-03-09 DIAGNOSIS — R2243 Localized swelling, mass and lump, lower limb, bilateral: Secondary | ICD-10-CM | POA: Diagnosis present

## 2017-03-09 LAB — BASIC METABOLIC PANEL
ANION GAP: 11 (ref 5–15)
BUN: 16 mg/dL (ref 6–20)
CALCIUM: 9.2 mg/dL (ref 8.9–10.3)
CO2: 28 mmol/L (ref 22–32)
Chloride: 98 mmol/L — ABNORMAL LOW (ref 101–111)
Creatinine, Ser: 1.63 mg/dL — ABNORMAL HIGH (ref 0.61–1.24)
GFR, EST AFRICAN AMERICAN: 49 mL/min — AB (ref >60)
GFR, EST NON AFRICAN AMERICAN: 42 mL/min — AB (ref >60)
Glucose, Bld: 105 mg/dL — ABNORMAL HIGH (ref 65–99)
POTASSIUM: 3.8 mmol/L (ref 3.5–5.1)
Sodium: 137 mmol/L (ref 135–145)

## 2017-03-09 LAB — CBC
HEMATOCRIT: 40.4 % (ref 40.0–52.0)
HEMOGLOBIN: 14 g/dL (ref 13.0–18.0)
MCH: 28.9 pg (ref 26.0–34.0)
MCHC: 34.7 g/dL (ref 32.0–36.0)
MCV: 83.5 fL (ref 80.0–100.0)
Platelets: 281 10*3/uL (ref 150–440)
RBC: 4.84 MIL/uL (ref 4.40–5.90)
RDW: 13.4 % (ref 11.5–14.5)
WBC: 8.1 10*3/uL (ref 3.8–10.6)

## 2017-03-09 LAB — BRAIN NATRIURETIC PEPTIDE: B NATRIURETIC PEPTIDE 5: 22 pg/mL (ref 0.0–100.0)

## 2017-03-09 LAB — TROPONIN I

## 2017-03-09 NOTE — Discharge Instructions (Signed)
Please elevate your legs as much as possible to prevent swelling. Please start wearing compression stockings, by putting them on early in the day to prevent swelling.  Return to the emergency department if you develop chest pain, shortness of breath, fever, or any other symptoms concerning to you.

## 2017-03-09 NOTE — ED Provider Notes (Signed)
[facility] Emergency Department Provider Note  ____________________________________________  Time seen: Approximately 6:40 PM  I have reviewed the triage vital signs and the nursing notes.   HISTORY  Chief Complaint Leg Swelling    HPI William Mueller is a 66 y.o. male with a history of COPD on 4 L nasal cannula, CAD status post MI and stents, CK ED, presenting with bilateral lower extremity swelling. The patient reports that he has had swelling for weeks, but this appears to have been a presentation and prior ED visits as far back as June/2018. The swelling is worse at the end of the day. He denies any associated calf pain, erythema, warmth. He has seen his primary care physician, who has started him on a diuretic, which has improved his symptoms but today he felt like the swelling is worse and he came for evaluation. The patient denies any new shortness of breath or chest pain. The patient has not tried compression hose.   Past Medical History:  Diagnosis Date  . Anxiety   . Chronic chest pain   . Chronic kidney disease (CKD), stage III (moderate) ([facility])   . COPD (chronic obstructive pulmonary disease) ([place])   . Coronary artery disease    a. 12/2013 PCI: mRCA 100% with L to R collats s/p PCI/DES;  b. 06/2014 MV: no ischemia/infarct, EF 53%;  c. 03/2015 Cath: LM nl, LAD nl, D1 80 (1.84mm), LCX 17m (<1.36mm), OM1 nl, RCA 55p, patent stent, RPDA nl, EF 65%; d. 09/2015 Cath: LM nl, LAD 15m, D1 80 (<67mm), LCX 103m/d (<1.67mm), OM1 nl, RCA 55p (FFR 0.93), patent stent, RPDA nl-->Med Rx.  . Daily headache   . Depression   . Diastolic dysfunction    a. echo 12/2013: EF 55-60%, mild LVH, GR1DD, inf HK, elevated CVP, mildly dilated IVC suggestive of increased RA pressure  . GERD (gastroesophageal reflux disease)   . High cholesterol   . Hypertension   . Nicotine addiction    a. using eCigs.  . Sleep apnea     Patient Active Problem List   Diagnosis Date Noted  .  Nicotine addiction   . CAD S/P PCI DES to RCA 03/11/2015  . Diastolic dysfunction   . Chronic chest pain   . Chest pain at rest 11/23/2014  . GERD (gastroesophageal reflux disease) 06/08/2014  . CKD (chronic kidney disease) stage 3, GFR 30-59 ml/min (HCC) 05/04/2014  . Bilateral leg pain 05/04/2014  . Lung nodule < 6cm on CT 05/04/2014  . Abnormal CT scan, kidney 05/04/2014  . Unstable angina ([place]) 05/04/2014  . Atypical chest pain 03/22/2014  . Atrial tachycardia, paroxysmal ([place]) 12/14/2013  . Atherosclerosis of native coronary artery with unstable angina pectoris ([place]) 12/13/2013  . NSTEMI (non-ST elevated myocardial infarction) ([place]) 12/13/2013  . Labile essential hypertension 12/11/2013  . Acute renal failure superimposed on stage 3 chronic kidney disease ([place]) 12/11/2013  . Hyperlipidemia 12/11/2013  . COPD (chronic obstructive pulmonary disease) ([place]) 12/11/2013    Past Surgical History:  Procedure Laterality Date  . CARDIAC CATHETERIZATION     MC x 1 stent  . CARDIAC CATHETERIZATION Left 03/12/2015   Procedure: Left Heart Cath and Coronary Angiography;  Surgeon: Leonie Man, MD;  Location: [facility];  Service: Cardiovascular;  Laterality: Left;  . CARDIAC CATHETERIZATION N/A 09/29/2015   Procedure: Left Heart Cath and Coronary Angiography;  Surgeon: Wellington Hampshire, MD;  Location: [place] [facility];  Service: Cardiovascular;  Laterality: N/A;  .  CORONARY ANGIOPLASTY    . LEFT HEART CATHETERIZATION WITH CORONARY ANGIOGRAM N/A 12/12/2013   Procedure: LEFT HEART CATHETERIZATION WITH CORONARY ANGIOGRAM;  Surgeon: Wellington Hampshire, MD;  Location: [place] [facility];  Service: Cardiovascular;  Laterality: N/A;    Current Outpatient Rx  . Order #: [id number] Class: Historical Med  . Order #: [id number] Class: Normal  . Order #: [id number] Class: Historical Med  . Order #: [id number] Class: Historical Med  . Order #: [id number] Class: Normal  . Order #: [id number] Class: Print  .  Order #: [id number] Class: Normal  . Order #: [id number] Class: Print  . Order #: [id number] Class: Normal  . Order #: [id number] Class: Historical Med  . Order #: [id number] Class: Normal  . Order #: [id number] Class: Normal  . Order #: [id number] Class: Normal  . Order #: [id number] Class: Print  . Order #: [id number] Class: Print  . Order #: [id number] Class: Print  . Order #: [id number] Class: Normal    Allergies Benzodiazepines; Paroxetine hcl; Serotonin reuptake inhibitors (ssris); Diazepam; Doxycycline monohydrate; Escitalopram oxalate; Isosorbide nitrate; Lorazepam; and Tetracyclines & related  Family History  Problem Relation Age of Onset  . Coronary artery disease Brother   . Heart disease Brother 43  . Heart disease Father        MI x 8  . Kidney disease Neg Hx   . Prostate cancer Neg Hx     Social History Social History  Substance Use Topics  . Smoking status: Current Every Day Smoker    Years: 48.00    Types: E-cigarettes    Last attempt to quit: 06/07/2009  . Smokeless tobacco: Never Used     Comment: Using electronic cigarette  . Alcohol use 3.6 oz/week    6 Cans of beer per week     Comment: Occasional    Review of Systems Constitutional: No fever/chills.No lightheadedness or syncope. Eyes: No visual changes. ENT: No sore throat. No congestion or rhinorrhea. Cardiovascular: Denies chest pain. Denies palpitations. Respiratory: Chronic unchanged shortness of breath.  No cough. Gastrointestinal: No abdominal pain.  No nausea, no vomiting.  No diarrhea.  No constipation. Genitourinary: Negative for dysuria. Musculoskeletal: Negative for back pain. Positive bilateral lower external swelling without Pain. Skin: Negative for rash, swelling or skin break on the LE's. Neurological: Negative for headaches. No focal numbness, tingling or weakness.     ____________________________________________   PHYSICAL EXAM:  VITAL SIGNS: ED Triage Vitals  Enc Vitals Group     BP  03/09/17 1717 (!) 158/94     Pulse Rate 03/09/17 1717 (!) 102     Resp 03/09/17 1717 20     Temp 03/09/17 1717 98.3 F (36.8 C)     Temp Source 03/09/17 1717 Oral     SpO2 03/09/17 1717 99 %     Weight 03/09/17 1718 194 lb (88 kg)     Height 03/09/17 1718 5\' 9"  (1.753 m)     Head Circumference --      Peak Flow --      Pain Score --      Pain Loc --      Pain Edu? --      Excl. in [place]? --     Constitutional: Alert and oriented. Chronically ill appearing and in no acute distress. Answers questions appropriately. Eyes: Conjunctivae are normal.  EOMI. No scleral icterus. Head: Atraumatic. Nose: No congestion/rhinnorhea. Mouth/Throat: Mucous membranes are moist.  Neck: No stridor.  Supple.  No JVD. No meningismus. Cardiovascular: Normal rate, regular rhythm. No murmurs, rubs or gallops.  Respiratory:  No accessory  muscle use or retractions. Lungs CTAB.  No wheezes, rales or ronchi. Prolonged expiratory rate. Gastrointestinal: Soft, nontender and nondistended.  No guarding or rebound.  No peritoneal signs. Musculoskeletal: Bilateral symmetric lower extremity edema that is pitting to just above the ankle. Normal DP and PT pulses bilaterally. Full range of motion of the bilateral ankles knees without pain. Skin is intact. No overlying erythema, swelling or drainage.. No ttp in the calves or palpable cords.  Negative Homan's sign. Neurologic:  A&Ox3.  Speech is clear.  Face and smile are symmetric.  EOMI.  Moves all extremities well. Skin:  Skin is warm, dry and intact. No rash noted. Psychiatric: Mood and affect are normal. Speech and behavior are normal.  Normal judgement.  ____________________________________________   LABS (all labs ordered are listed, but only abnormal results are displayed)  Labs Reviewed  BASIC METABOLIC PANEL - Abnormal; Notable for the following:       Result Value   Chloride 98 (*)    Glucose, Bld 105 (*)    Creatinine, Ser 1.63 (*)    GFR calc non Af Amer  42 (*)    GFR calc Af Amer 49 (*)    All other components within normal limits  CBC  BRAIN NATRIURETIC PEPTIDE  TROPONIN I   ____________________________________________  EKG  ED ECG REPORT I, Eula Listen, the attending physician, personally viewed and interpreted this ECG.   Date: 03/09/2017  EKG Time: 1849  Rate: 79  Rhythm: normal sinus rhythm  Axis: normal  Intervals:none  ST&T Change: no STEMI  ____________________________________________  RADIOLOGY  Dg Chest 2 View  Result Date: 03/09/2017 CLINICAL DATA:  Lower extremity swelling and dyspnea EXAM: CHEST  2 VIEW COMPARISON:  11/06/2016 chest radiograph. FINDINGS: Stable cardiomediastinal silhouette with normal heart size and aortic atherosclerosis. No pneumothorax. No pleural effusion. Hyperinflated lungs. Emphysema. No pulmonary edema. No acute consolidative airspace disease. IMPRESSION: No acute cardiopulmonary disease. Hyperinflated lungs and emphysema, suggesting COPD. Electronically Signed   By: Ilona Sorrel M.D.   On: 03/09/2017 18:58   US Venous Img Lower Bilateral  Result Date: 03/09/2017 CLINICAL DATA:  Bilateral lower extremity swelling since yesterday. On and off swelling for several months. EXAM: BILATERAL LOWER EXTREMITY VENOUS DUPLEX ULTRASOUND TECHNIQUE: Doppler venous assessment of the bilateral lower extremity deep venous system was performed, including characterization of spectral flow, compressibility, and phasicity. COMPARISON:  None. FINDINGS: There is complete compressibility of the bilateral common femoral, femoral, and popliteal veins. Doppler analysis demonstrates respiratory phasicity and augmentation of flow with calf compression. No obvious superficial vein or calf vein thrombosis. IMPRESSION: No evidence of lower extremity DVT. Electronically Signed   By: Marybelle Killings M.D.   On: 03/09/2017 19:56    ____________________________________________   PROCEDURES  Procedure(s) performed:  None  Procedures  Critical Care performed: No ____________________________________________   INITIAL IMPRESSION / ASSESSMENT AND PLAN / ED COURSE  Pertinent labs & imaging results that were available during my care of the patient were reviewed by me and considered in my medical decision making (see chart for details).  66 y.o. male with CK ED and CAD history presenting with weeks of bilateral lower extremity swelling, worse by the end of the day. Overall, there are multiple possible etiologies including his CK ED, will also evaluate for CHF. Consider vascular insufficiency or leaky valves. Rule out DVT.  ----------------------------------------- 6:44 PM on 03/09/2017 -----------------------------------------  The patient has a normal BNP and no history of CHF, so this is unlikely the cause. Today,  his creatinine is grossly unchanged compared to baseline. I am awaiting results results of his eye lateral lower 70 ultrasounds, and if this is negative, I will suggest that the patient purchase compression hose, and follow-up with her primary care physician. I will not change his diuresis due to concerns about hypovolemia and resulting acute kidney injury.  ----------------------------------------- 8:12 PM on 03/09/2017 -----------------------------------------  The patient does not have a lower 70 DVT. I talked to him about return precautions and follow-up instructions.  ____________________________________________  FINAL CLINICAL IMPRESSION(S) / ED DIAGNOSES  Final diagnoses:  Leg edema         NEW MEDICATIONS STARTED DURING THIS VISIT:  New Prescriptions   No medications on file      Eula Listen, MD 03/09/17 2013

## 2017-03-09 NOTE — ED Triage Notes (Addendum)
Pt reports bilateral leg swelling, redness and tingling that began yesterday. Pt denies history of CHF but reports other cardiac and renal disorders. Pt ambulatory to triage without difficulty. Bilateral strong pedal pulses palpated. Verbal orders received from Dr. Joni Fears for protocols.

## 2017-03-18 ENCOUNTER — Other Ambulatory Visit: Payer: Self-pay | Admitting: Internal Medicine

## 2017-03-18 ENCOUNTER — Other Ambulatory Visit: Payer: Self-pay | Admitting: Cardiovascular Disease

## 2017-03-18 NOTE — Telephone Encounter (Signed)
Refill Request please advise if ok to refill?

## 2017-03-22 DIAGNOSIS — H353211 Exudative age-related macular degeneration, right eye, with active choroidal neovascularization: Secondary | ICD-10-CM | POA: Diagnosis not present

## 2017-03-29 ENCOUNTER — Telehealth: Payer: Self-pay | Admitting: Pharmacist

## 2017-03-29 NOTE — Telephone Encounter (Signed)
03/29/17 [place] for refill on Symbicort 160/4.5 & Daliresp 500- allow 7-10 business days to receive medication.[name]

## 2017-04-04 DIAGNOSIS — N289 Disorder of kidney and ureter, unspecified: Secondary | ICD-10-CM | POA: Diagnosis not present

## 2017-04-04 DIAGNOSIS — R0609 Other forms of dyspnea: Secondary | ICD-10-CM | POA: Diagnosis not present

## 2017-04-04 DIAGNOSIS — J439 Emphysema, unspecified: Secondary | ICD-10-CM | POA: Diagnosis not present

## 2017-04-04 DIAGNOSIS — Z9981 Dependence on supplemental oxygen: Secondary | ICD-10-CM | POA: Diagnosis not present

## 2017-04-04 DIAGNOSIS — Z23 Encounter for immunization: Secondary | ICD-10-CM | POA: Diagnosis not present

## 2017-04-07 DIAGNOSIS — J449 Chronic obstructive pulmonary disease, unspecified: Secondary | ICD-10-CM | POA: Diagnosis not present

## 2017-04-08 ENCOUNTER — Telehealth: Payer: Self-pay | Admitting: Cardiovascular Disease

## 2017-04-08 NOTE — Telephone Encounter (Signed)
Pt wife would like to know if he should still be taking Zetia. Please call and advise.

## 2017-04-08 NOTE — Telephone Encounter (Signed)
Patients wife wanted to check and see if he should still be taking the Zetia. She reports that he went into the donut hole during April and has not been on it since then. He was approved yesterday through medication management and they received the Zetia as well. Reviewed that he should resume taking this medication and that if they should have any other questions to please give me a call back. Also reviewed cost of this at [employer] which is roughly $25 for generic brand. She was very appreciative for the call back and has no further questions at this time.

## 2017-04-18 ENCOUNTER — Telehealth: Payer: Self-pay | Admitting: Pharmacist

## 2017-04-18 NOTE — Telephone Encounter (Signed)
04/18/17 Called [employer] for refill on Nitrostat 0.4mg  order# [id number], allow 7-10 business days to receive. This is patient's last order, patient Medicare starts over in Jan. 2019.[name]

## 2017-04-20 ENCOUNTER — Telehealth: Payer: Self-pay | Admitting: Pharmacist

## 2017-04-20 NOTE — Telephone Encounter (Signed)
--   William Mueller - Wednesday, April 20, 2017 11:32 AM -- Refilled online-Ventolin HFA-will release 06/07/17, order# [id number].

## 2017-04-26 DIAGNOSIS — H353211 Exudative age-related macular degeneration, right eye, with active choroidal neovascularization: Secondary | ICD-10-CM | POA: Diagnosis not present

## 2017-05-07 DIAGNOSIS — J449 Chronic obstructive pulmonary disease, unspecified: Secondary | ICD-10-CM | POA: Diagnosis not present

## 2017-05-11 NOTE — Progress Notes (Signed)
Cardiology Office Note  Date:  05/12/2017   ID:  William Mueller, DOB Dec 24, 1950, MRN [id number]  PCP:  Philmore Pali, NP   Chief Complaint  Patient presents with  . Other    6 month follow up. Patient c/o SOB, chest pain off and on, and left leg pain. Meds reviewed verbally with patient.     HPI:  William Mueller is a very pleasant 66 year old gentleman with  long history of smoking, COPD,  chronic renal insufficiency, CR 1.7  12/11/2013 to  with chest pain cardiac catheterization lab : occluded mid RCA. Stent was placed.  Repeat catheterization in October 2016 with patent stent,disease of a small diagonal and distal circumflex not amenable to intervention.  History of paroxysmal atrial tachycardia He presents today for follow-up of his coronary artery disease And COPD and new symptom of leg pain, back pain  On his visit today he reports having severe low back pain, left leg pain Left leg throbs at night Sometimes hurts when he walks, Pins and needles, numbness bottom of his feet If he bends over for too long has difficulty standing back up right will get severe discomfort Feels his symptoms are getting worse Not certain if he has sciatica but left leg is always in pain  In the ER 03/09/2017 Feet swelling, tingling Hospital records reviewed with the patient in detail Reports he has been taking Lasix daily  Reports that he sits on the couch most of the day, no activity No regular exercise program given arthritic issues as above  Of the past medical history reviewed hospital admission  July 26 2016 discharged February 20  admitted for chest pain, atypical Had adjustments to his blood pressure medications Seen by cardiology in the hospital, felt it was atypical Possible transient angina in the setting of uncontrolled hypertension Beta blocker dose was increased  Seen in the emergency room 11/01/2016 for chest pain Atypical in nature  EKG personally reviewed by  myself on todays visit Normal sinus rhythm with rate 80 bpm no significant ST or T-wave changes   other past medical history reviewed  In the ER 02/2016 ABD pain. He still does not know the cause of his pain  Previously called EMS HR 147, on couch, did not feel right Sinus tach on EKG he provided to [place] today  Felt he was having a panic attack Talked with EMS and heart rate improved No further episodes since that time  He reports having periodic tachycardia when he wakes up in the morning with associated hypoxemia. Numbers on his pulse oximeter are in the 80s  does not use oxygen at nighttime to sleep  April 2017 went to the emergency room with chest tightness, kept in the hospital, refused stress test, had cardiac catheterization showing patent stent, disease was relatively unchanged Date of catheterization 09/29/2015 Echocardiogram confirmed ejection fraction 65%  Previously did a PFT, technician reported that he passed out. He does not remember any significant difficulty but the test was abruptly stopped  Unable to tolerate isosorbide secondary to headache previously total cholesterol was 200  he presented to the hospital 02/26/2014 with chest pain. He seen by cardiology, rule out for MI, had normal EKG and discharged home. Echocardiogram July 2015 showed ejection fraction 55-60%  Creatinine in 10/22/2013 was 2.1  PMH:   has a past medical history of Anxiety, Chronic chest pain, Chronic kidney disease (CKD), stage III (moderate) (HCC), COPD (chronic obstructive pulmonary disease) ([place]), Coronary artery disease, Daily headache, Depression,  Diastolic dysfunction, GERD (gastroesophageal reflux disease), High cholesterol, Hypertension, Nicotine addiction, and Sleep apnea.  PSH:    Past Surgical History:  Procedure Laterality Date  . CARDIAC CATHETERIZATION     MC x 1 stent  . CARDIAC CATHETERIZATION Left 03/12/2015   Procedure: Left Heart Cath and Coronary Angiography;   Surgeon: Leonie Man, MD;  Location: [facility];  Service: Cardiovascular;  Laterality: Left;  . CARDIAC CATHETERIZATION N/A 09/29/2015   Procedure: Left Heart Cath and Coronary Angiography;  Surgeon: Wellington Hampshire, MD;  Location: [facility];  Service: Cardiovascular;  Laterality: N/A;  . CORONARY ANGIOPLASTY    . LEFT HEART CATHETERIZATION WITH CORONARY ANGIOGRAM N/A 12/12/2013   Procedure: LEFT HEART CATHETERIZATION WITH CORONARY ANGIOGRAM;  Surgeon: Wellington Hampshire, MD;  Location: [facility];  Service: Cardiovascular;  Laterality: N/A;    Current Outpatient Medications  Medication Sig Dispense Refill  . acetaminophen (TYLENOL) 500 MG tablet Take 1,000 mg by mouth daily as needed for headache.     . albuterol (PROVENTIL) (2.5 MG/3ML) 0.083% nebulizer solution Take 3 mLs (2.5 mg total) by nebulization every 6 (six) hours as needed for wheezing or shortness of breath. 30 vial 0  . ALPRAZolam (XANAX XR) 3 MG 24 hr tablet Take 3 mg by mouth every evening.     Marland Kitchen ALPRAZolam (XANAX) 1 MG tablet Take 1 mg by mouth daily.     Marland Kitchen amLODipine (NORVASC) 5 MG tablet Take 1 tablet (5 mg total) by mouth daily. 90 tablet 3  . aspirin 81 MG EC tablet Take 1 tablet (81 mg total) by mouth daily. 90 tablet 3  . atorvastatin (LIPITOR) 80 MG tablet Take 1 tablet (80 mg total) by mouth daily. 90 tablet 3  . budesonide-formoterol (SYMBICORT) 160-4.5 MCG/ACT inhaler Inhale 2 puffs into the lungs 2 (two) times daily. 3 Inhaler 1  . ezetimibe (ZETIA) 10 MG tablet Take 1 tablet (10 mg total) by mouth daily. 90 tablet 3  . furosemide (LASIX) 20 MG tablet Take 20 mg by mouth daily. 1qam    . hydrALAZINE (APRESOLINE) 25 MG tablet Take 1 tablet (25 mg total) by mouth 3 (three) times daily as needed. 270 tablet 3  . metoprolol succinate (TOPROL-XL) 50 MG 24 hr tablet TAKE ONE TABLET EVERY DAY WITH A MEAL 30 tablet 2  . NITROSTAT 0.4 MG SL tablet PLACE 1 TABLET UNDER TONGUE EVERY 5 MINUTES AS NEEDED  FOR CHEST PAIN.CALL 911 AFTER 3RD TABLET. 25 tablet 1  . omeprazole (PRILOSEC) 40 MG capsule Take 1 capsule (40 mg total) by mouth daily. 90 capsule 1  . roflumilast (DALIRESP) 500 MCG TABS tablet Take 1 tablet (500 mcg total) by mouth daily. 90 tablet 1  . tiotropium (SPIRIVA) 18 MCG inhalation capsule Place 1 capsule (18 mcg total) into inhaler and inhale daily. 90 capsule 1  . VENTOLIN HFA 108 (90 Base) MCG/ACT inhaler INHALE 1 TO 2 PUFFS INTO THE LUNGS EVERY 6 HOURS AS NEEDED FOR WHEEZING OR SHORTNESS OF BREATH. 54 g 0   No current facility-administered medications for this visit.      Allergies:   Benzodiazepines; Paroxetine hcl; Serotonin reuptake inhibitors (ssris); Diazepam; Doxycycline monohydrate; Escitalopram oxalate; Isosorbide nitrate; Lorazepam; and Tetracyclines & related   Social History:  The patient  reports that he has been smoking e-cigarettes.  He has smoked for the past 48.00 years. he has never used smokeless tobacco. He reports that he drinks about 3.6 oz of alcohol per  week. He reports that he does not use drugs.   Family History:   family history includes Coronary artery disease in his brother; Heart disease in his father; Heart disease (age of onset: 25) in his brother.    Review of Systems: Review of Systems  Constitutional: Negative.   Respiratory: Negative.   Cardiovascular: Negative.   Gastrointestinal: Negative.   Musculoskeletal: Positive for back pain.       Left leg pain  Neurological: Negative.   Psychiatric/Behavioral: Negative.   All other systems reviewed and are negative.    PHYSICAL EXAM: VS:  BP 132/82 (BP Location: Left Arm, Patient Position: Sitting, Cuff Size: Normal)   Pulse 80   Ht 5\' 9"  (1.753 m)   Wt 201 lb (91.2 kg)   BMI 29.68 kg/m  , BMI Body mass index is 29.68 kg/m. GEN: Well nourished, well developed, in no acute distress  HEENT: normal  Neck: no JVD, carotid bruits, or masses Cardiac: RRR; no murmurs, rubs, or  gallops,no edema  Decreased lower extremity arterial pulses Respiratory:  Mildly decreased lung sounds throughout, normal work of breathing GI: soft, nontender, nondistended, + BS MS: no deformity or atrophy  Skin: warm and dry, no rash Neuro:  Strength and sensation are intact Psych: euthymic mood, full affect    Recent Labs: 03/09/2017: B Natriuretic Peptide 22.0; BUN 16; Creatinine, Ser 1.63; Hemoglobin 14.0; Platelets 281; Potassium 3.8; Sodium 137    Lipid Panel Lab Results  Component Value Date   CHOL 106 07/27/2016   HDL 40 (L) 07/27/2016   LDLCALC 42 07/27/2016   TRIG 118 07/27/2016      Wt Readings from Last 3 Encounters:  05/12/17 201 lb (91.2 kg)  03/09/17 194 lb (88 kg)  01/31/17 196 lb (88.9 kg)       ASSESSMENT AND PLAN:  Essential hypertension - Plan: EKG 12-Lead Blood pressure is well controlled on today's visit. No changes made to the medications. Stable  Mixed hyperlipidemia - Plan: EKG 12-Lead Cholesterol is at goal on the current lipid regimen. No changes to the medications were made. Stable  Atrial tachycardia, paroxysmal (HCC) - Plan: EKG 12-Lead Denies any further episodes of tachycardia Previous symptoms possibly exacerbated by anxiety/stress extra metoprolol as needed for tachycardia .  No further changes or workup needed   CAD S/P PCI DES to RCA - Plan: EKG 12-Lead Denies having any recent episodes of chest pain Lipids at goal, no further ischemic workup  Anxiety/stress Will defer management to primary care  Chronic back pain, leg pain Reports pain is debilitating Recommended he consider evaluation by neurosurgery in [facility] Unable to exclude spinal stenosis or nerve compression We will order ABI lower externally arterial Doppler to rule out claudication  Foot swelling Stressed to him the importance of staying active, leg elevation when sitting on his couch Symptoms seem to wax and wane, possibly positional in nature BNP low.   Stay on Lasix daily likely does not need extra  Lasix Compression hose if needed   Total encounter time more than 45 minutes  Greater than 50% was spent in counseling and coordination of care with the patient   Disposition:   F/U  12 months   Orders Placed This Encounter  Procedures  . EKG 12-Lead     Signed, Esmond Plants, M.D., Ph.D. 05/12/2017  [place], [place]

## 2017-05-12 ENCOUNTER — Telehealth: Payer: Self-pay | Admitting: Pharmacist

## 2017-05-12 ENCOUNTER — Ambulatory Visit: Payer: PPO | Admitting: Cardiovascular Disease

## 2017-05-12 ENCOUNTER — Encounter: Payer: Self-pay | Admitting: Cardiovascular Disease

## 2017-05-12 VITALS — BP 132/82 | HR 80 | Ht 69.0 in | Wt 201.0 lb

## 2017-05-12 DIAGNOSIS — F17298 Nicotine dependence, other tobacco product, with other nicotine-induced disorders: Secondary | ICD-10-CM | POA: Diagnosis not present

## 2017-05-12 DIAGNOSIS — I25110 Atherosclerotic heart disease of native coronary artery with unstable angina pectoris: Secondary | ICD-10-CM

## 2017-05-12 DIAGNOSIS — J432 Centrilobular emphysema: Secondary | ICD-10-CM | POA: Diagnosis not present

## 2017-05-12 DIAGNOSIS — N183 Chronic kidney disease, stage 3 unspecified: Secondary | ICD-10-CM

## 2017-05-12 DIAGNOSIS — I739 Peripheral vascular disease, unspecified: Secondary | ICD-10-CM

## 2017-05-12 DIAGNOSIS — I10 Essential (primary) hypertension: Secondary | ICD-10-CM | POA: Diagnosis not present

## 2017-05-12 DIAGNOSIS — I471 Supraventricular tachycardia: Secondary | ICD-10-CM

## 2017-05-12 DIAGNOSIS — I2510 Atherosclerotic heart disease of native coronary artery without angina pectoris: Secondary | ICD-10-CM

## 2017-05-12 DIAGNOSIS — Z9861 Coronary angioplasty status: Secondary | ICD-10-CM | POA: Diagnosis not present

## 2017-05-12 DIAGNOSIS — E782 Mixed hyperlipidemia: Secondary | ICD-10-CM | POA: Diagnosis not present

## 2017-05-12 NOTE — Telephone Encounter (Signed)
05/12/17 Called [facility] spoke with Jerrell Belfast to check on order -they shipped to patient 03/03/17 and 05/11/17. Patient has 2 refills left, and enrollment till 06/06/2017.William Mueller

## 2017-05-12 NOTE — Patient Instructions (Addendum)
Medication Instructions:   No medication changes made  Leg compression and leg elevation for leg swelling  Labwork:  No new labs needed  Testing/Procedures: We will schedule a lower extremity arterial doppler/ABI For claudication , left leg >right   Follow-Up: It was a pleasure seeing you in the office today. Please call us if you have new issues that need to be addressed before your next appt.  [phone]  Your physician wants you to follow-up in: 12 months.  You will receive a reminder letter in the mail two months in advance. If you don't receive a letter, please call our office to schedule the follow-up appointment.  If you need a refill on your cardiac medications before your next appointment, please call your pharmacy.

## 2017-05-13 ENCOUNTER — Other Ambulatory Visit: Payer: Self-pay | Admitting: Cardiovascular Disease

## 2017-05-24 ENCOUNTER — Other Ambulatory Visit: Payer: Self-pay | Admitting: Cardiovascular Disease

## 2017-05-25 DIAGNOSIS — N183 Chronic kidney disease, stage 3 (moderate): Secondary | ICD-10-CM | POA: Diagnosis not present

## 2017-06-01 ENCOUNTER — Ambulatory Visit (INDEPENDENT_AMBULATORY_CARE_PROVIDER_SITE_OTHER): Payer: PPO

## 2017-06-01 DIAGNOSIS — I739 Peripheral vascular disease, unspecified: Secondary | ICD-10-CM | POA: Diagnosis not present

## 2017-06-03 ENCOUNTER — Telehealth: Payer: Self-pay | Admitting: Pharmacist

## 2017-06-03 NOTE — Telephone Encounter (Signed)
06/03/17 Called [facility] for refill on Zetia 10mg .[name]

## 2017-06-07 DIAGNOSIS — J449 Chronic obstructive pulmonary disease, unspecified: Secondary | ICD-10-CM | POA: Diagnosis not present

## 2017-06-07 DIAGNOSIS — I209 Angina pectoris, unspecified: Secondary | ICD-10-CM

## 2017-06-07 DIAGNOSIS — J40 Bronchitis, not specified as acute or chronic: Secondary | ICD-10-CM

## 2017-06-07 HISTORY — DX: Angina pectoris, unspecified: I20.9

## 2017-06-07 HISTORY — DX: Bronchitis, not specified as acute or chronic: J40

## 2017-06-09 DIAGNOSIS — H353211 Exudative age-related macular degeneration, right eye, with active choroidal neovascularization: Secondary | ICD-10-CM | POA: Diagnosis not present

## 2017-06-10 DIAGNOSIS — R05 Cough: Secondary | ICD-10-CM | POA: Diagnosis not present

## 2017-06-10 DIAGNOSIS — J310 Chronic rhinitis: Secondary | ICD-10-CM | POA: Diagnosis not present

## 2017-06-10 DIAGNOSIS — J441 Chronic obstructive pulmonary disease with (acute) exacerbation: Secondary | ICD-10-CM | POA: Diagnosis not present

## 2017-06-12 ENCOUNTER — Encounter: Payer: Self-pay | Admitting: Intensive Care

## 2017-06-12 ENCOUNTER — Emergency Department: Payer: PPO

## 2017-06-12 ENCOUNTER — Emergency Department
Admission: EM | Admit: 2017-06-12 | Discharge: 2017-06-12 | Disposition: A | Discharge status: Home / Self Care | Payer: PPO | Attending: Emergency Medicine | Admitting: Emergency Medicine

## 2017-06-12 DIAGNOSIS — J40 Bronchitis, not specified as acute or chronic: Secondary | ICD-10-CM | POA: Insufficient documentation

## 2017-06-12 DIAGNOSIS — S299XXA Unspecified injury of thorax, initial encounter: Secondary | ICD-10-CM | POA: Diagnosis present

## 2017-06-12 DIAGNOSIS — S29011A Strain of muscle and tendon of front wall of thorax, initial encounter: Secondary | ICD-10-CM | POA: Diagnosis not present

## 2017-06-12 DIAGNOSIS — J449 Chronic obstructive pulmonary disease, unspecified: Secondary | ICD-10-CM | POA: Insufficient documentation

## 2017-06-12 DIAGNOSIS — Y33XXXA Other specified events, undetermined intent, initial encounter: Secondary | ICD-10-CM | POA: Diagnosis not present

## 2017-06-12 DIAGNOSIS — I129 Hypertensive chronic kidney disease with stage 1 through stage 4 chronic kidney disease, or unspecified chronic kidney disease: Secondary | ICD-10-CM | POA: Insufficient documentation

## 2017-06-12 DIAGNOSIS — Y939 Activity, unspecified: Secondary | ICD-10-CM | POA: Diagnosis not present

## 2017-06-12 DIAGNOSIS — I2510 Atherosclerotic heart disease of native coronary artery without angina pectoris: Secondary | ICD-10-CM | POA: Diagnosis not present

## 2017-06-12 DIAGNOSIS — F17290 Nicotine dependence, other tobacco product, uncomplicated: Secondary | ICD-10-CM | POA: Insufficient documentation

## 2017-06-12 DIAGNOSIS — Y999 Unspecified external cause status: Secondary | ICD-10-CM | POA: Insufficient documentation

## 2017-06-12 DIAGNOSIS — Y929 Unspecified place or not applicable: Secondary | ICD-10-CM | POA: Insufficient documentation

## 2017-06-12 DIAGNOSIS — N183 Chronic kidney disease, stage 3 (moderate): Secondary | ICD-10-CM | POA: Diagnosis not present

## 2017-06-12 DIAGNOSIS — Z955 Presence of coronary angioplasty implant and graft: Secondary | ICD-10-CM | POA: Insufficient documentation

## 2017-06-12 DIAGNOSIS — Z7982 Long term (current) use of aspirin: Secondary | ICD-10-CM | POA: Diagnosis not present

## 2017-06-12 DIAGNOSIS — R0781 Pleurodynia: Secondary | ICD-10-CM | POA: Diagnosis not present

## 2017-06-12 DIAGNOSIS — Z79899 Other long term (current) drug therapy: Secondary | ICD-10-CM | POA: Insufficient documentation

## 2017-06-12 DIAGNOSIS — F17210 Nicotine dependence, cigarettes, uncomplicated: Secondary | ICD-10-CM | POA: Diagnosis not present

## 2017-06-12 MED ORDER — ALBUTEROL SULFATE (2.5 MG/3ML) 0.083% IN NEBU: 2.5000 mg | INHALATION_SOLUTION | Freq: Four times a day (QID) | RESPIRATORY_TRACT | 0 refills | Status: DC | PRN

## 2017-06-12 MED ORDER — HYDROCODONE-ACETAMINOPHEN 5-325 MG PO TABS
1.0000 | ORAL_TABLET | Freq: Once | ORAL | Status: AC
  Administered 2017-06-12: 1 via ORAL
  Filled 2017-06-12: qty 1

## 2017-06-12 MED ORDER — ALBUTEROL SULFATE (2.5 MG/3ML) 0.083% IN NEBU
2.5000 mg | INHALATION_SOLUTION | Freq: Once | RESPIRATORY_TRACT | Status: AC
  Administered 2017-06-12: 2.5 mg via RESPIRATORY_TRACT
  Filled 2017-06-12: qty 3

## 2017-06-12 NOTE — ED Provider Notes (Signed)
[facility] Emergency Department Provider Note  ____________________________________________   None    (approximate)  I have reviewed the triage vital signs and the nursing notes.   HISTORY  Chief Complaint Chest Pain (L sided rib pain )   HPI William Mueller is a 67 y.o. male is here complaining of left rib pain every time he coughs. Patient states he was diagnosed with bronchitis couple days ago and heard a pop in his rib cage when he was coughing a couple nights ago. Patient continues to take prednisone as directed by his doctor. Patient denies any fever or chills. He denies any difficulty with breathing. He currently is using O2 at 2 L for his COPD. He last used his nebulizer treatment at home yesterday.he rates his pain as 10 over 10.   Past Medical History:  Diagnosis Date  . Anxiety   . Chronic chest pain   . Chronic kidney disease (CKD), stage III (moderate) (HCC)   . COPD (chronic obstructive pulmonary disease) ([place])   . Coronary artery disease    a. 12/2013 PCI: mRCA 100% with L to R collats s/p PCI/DES;  b. 06/2014 MV: no ischemia/infarct, EF 53%;  c. 03/2015 Cath: LM nl, LAD nl, D1 80 (1.59mm), LCX 46m (<1.16mm), OM1 nl, RCA 55p, patent stent, RPDA nl, EF 65%; d. 09/2015 Cath: LM nl, LAD 32m, D1 80 (<61mm), LCX 73m/d (<1.48mm), OM1 nl, RCA 55p (FFR 0.93), patent stent, RPDA nl-->Med Rx.  . Daily headache   . Depression   . Diastolic dysfunction    a. echo 12/2013: EF 55-60%, mild LVH, GR1DD, inf HK, elevated CVP, mildly dilated IVC suggestive of increased RA pressure  . GERD (gastroesophageal reflux disease)   . High cholesterol   . Hypertension   . Nicotine addiction    a. using eCigs.  . Sleep apnea     Patient Active Problem List   Diagnosis Date Noted  . Nicotine addiction   . CAD S/P PCI DES to RCA 03/11/2015  . Diastolic dysfunction   . Chronic chest pain   . Chest pain at rest 11/23/2014  . GERD (gastroesophageal reflux disease)  06/08/2014  . CKD (chronic kidney disease) stage 3, GFR 30-59 ml/min (HCC) 05/04/2014  . Bilateral leg pain 05/04/2014  . Lung nodule < 6cm on CT 05/04/2014  . Abnormal CT scan, kidney 05/04/2014  . Unstable angina ([place]) 05/04/2014  . Atypical chest pain 03/22/2014  . Atrial tachycardia, paroxysmal ([place]) 12/14/2013  . Atherosclerosis of native coronary artery with unstable angina pectoris ([place]) 12/13/2013  . NSTEMI (non-ST elevated myocardial infarction) ([place]) 12/13/2013  . Labile essential hypertension 12/11/2013  . Acute renal failure superimposed on stage 3 chronic kidney disease ([place]) 12/11/2013  . Hyperlipidemia 12/11/2013  . COPD (chronic obstructive pulmonary disease) ([place]) 12/11/2013    Past Surgical History:  Procedure Laterality Date  . CARDIAC CATHETERIZATION     MC x 1 stent  . CARDIAC CATHETERIZATION Left 03/12/2015   Procedure: Left Heart Cath and Coronary Angiography;  Surgeon: Leonie Man, MD;  Location: [place] [facility];  Service: Cardiovascular;  Laterality: Left;  . CARDIAC CATHETERIZATION N/A 09/29/2015   Procedure: Left Heart Cath and Coronary Angiography;  Surgeon: Wellington Hampshire, MD;  Location: [place] [facility];  Service: Cardiovascular;  Laterality: N/A;  . CORONARY ANGIOPLASTY    . LEFT HEART CATHETERIZATION WITH CORONARY ANGIOGRAM N/A 12/12/2013   Procedure: LEFT HEART CATHETERIZATION WITH CORONARY ANGIOGRAM;  Surgeon: Wellington Hampshire,  MD;  Location: [place] [facility];  Service: Cardiovascular;  Laterality: N/A;    Prior to Admission medications   Medication Sig Start Date End Date Taking? Authorizing Provider  acetaminophen (TYLENOL) 500 MG tablet Take 1,000 mg by mouth daily as needed for headache.     [provider]  albuterol (PROVENTIL) (2.5 MG/3ML) 0.083% nebulizer solution Take 3 mLs (2.5 mg total) by nebulization every 6 (six) hours as needed for wheezing or shortness of breath. 06/12/17   Johnn Hai, PA-C  ALPRAZolam (XANAX XR) 3  MG 24 hr tablet Take 3 mg by mouth every evening.     [provider]  ALPRAZolam Duanne Moron) 1 MG tablet Take 1 mg by mouth daily.  11/21/13   [provider]  amLODipine (NORVASC) 5 MG tablet Take 1 tablet (5 mg total) by mouth daily. 01/04/17   Minna Merritts, MD  aspirin 81 MG EC tablet Take 1 tablet (81 mg total) by mouth daily. 09/04/15   Minna Merritts, MD  atorvastatin (LIPITOR) 80 MG tablet Take 1 tablet (80 mg total) by mouth daily. 08/18/16   Minna Merritts, MD  budesonide-formoterol (SYMBICORT) 160-4.5 MCG/ACT inhaler Inhale 2 puffs into the lungs 2 (two) times daily. 09/04/15   Laverle Hobby, MD  ezetimibe (ZETIA) 10 MG tablet Take 1 tablet (10 mg total) by mouth daily. 06/14/16   Minna Merritts, MD  furosemide (LASIX) 20 MG tablet Take 20 mg by mouth daily. 1qam    [provider]  hydrALAZINE (APRESOLINE) 25 MG tablet Take 1 tablet (25 mg total) by mouth 3 (three) times daily as needed. 07/19/16   Minna Merritts, MD  metoprolol succinate (TOPROL-XL) 50 MG 24 hr tablet TAKE ONE TABLET EVERY DAY WITH A MEAL 05/24/17   Gollan, Kathlene November, MD  NITROSTAT 0.4 MG SL tablet PLACE 1 TABLET UNDER TONGUE EVERY 5 MINUTES AS NEEDED FOR CHEST PAIN.CALL 911 AFTER 3RD TABLET. 11/14/15   Minna Merritts, MD  omeprazole (PRILOSEC) 40 MG capsule Take 1 capsule (40 mg total) by mouth daily. 06/28/16   Minna Merritts, MD  roflumilast (DALIRESP) 500 MCG TABS tablet Take 1 tablet (500 mcg total) by mouth daily. 09/04/15   Laverle Hobby, MD  tiotropium (SPIRIVA) 18 MCG inhalation capsule Place 1 capsule (18 mcg total) into inhaler and inhale daily. 09/04/15   Laverle Hobby, MD  VENTOLIN HFA 108 (90 Base) MCG/ACT inhaler INHALE 1 TO 2 PUFFS INTO THE LUNGS EVERY 6 HOURS AS NEEDED FOR WHEEZING OR SHORTNESS OF BREATH. 03/25/16   Laverle Hobby, MD    Allergies Benzodiazepines; Paroxetine hcl; Serotonin reuptake inhibitors (ssris); Diazepam; Doxycycline  monohydrate; Escitalopram oxalate; Isosorbide nitrate; Lorazepam; and Tetracyclines & related  Family History  Problem Relation Age of Onset  . Coronary artery disease Brother   . Heart disease Brother 42  . Heart disease Father        MI x 8  . Kidney disease Neg Hx   . Prostate cancer Neg Hx     Social History Social History   Tobacco Use  . Smoking status: Current Every Day Smoker    Years: 48.00    Types: E-cigarettes    Last attempt to quit: 06/07/2009    Years since quitting: 8.0  . Smokeless tobacco: Never Used  . Tobacco comment: Using electronic cigarette  Substance Use Topics  . Alcohol use: Yes    Alcohol/week: 3.6 oz    Types: 6 Cans of beer per week  Comment: Occasional  . Drug use: No    Review of Systems Constitutional: No fever/chills Cardiovascular: Denies chest pain. Respiratory: positive for cough. Gastrointestinal:   No nausea, no vomiting. Musculoskeletal: positive for left rib pain. Skin: Negative for rash. Neurological: Negative for headaches, focal weakness or numbness. ____________________________________________   PHYSICAL EXAM:  VITAL SIGNS: ED Triage Vitals  Enc Vitals Group     BP 06/12/17 0740 (!) 142/85     Pulse Rate 06/12/17 0740 (!) 104     Resp 06/12/17 0740 18     Temp 06/12/17 0740 97.6 F (36.4 C)     Temp Source 06/12/17 0740 Oral     SpO2 06/12/17 0740 100 %     Weight 06/12/17 0741 203 lb (92.1 kg)     Height 06/12/17 0741 5\' 9"  (1.753 m)     Head Circumference --      Peak Flow --      Pain Score 06/12/17 0740 10     Pain Loc --      Pain Edu? --      Excl. in [place]? --    Constitutional: Alert and oriented. Well appearing and in no acute distress. Eyes: Conjunctivae are normal.  Head: Atraumatic. Nose: No congestion/rhinnorhea. Neck: No stridor.   Hematological/Lymphatic/Immunilogical: No cervical lymphadenopathy. Cardiovascular: Normal rate, regular rhythm. Grossly normal heart sounds.  Good peripheral  circulation. Respiratory: Normal respiratory effort.  No retractions. Lungs  Coarse congested cough without wheezing. Scattered rhonchi bilaterally. Musculoskeletal: moves upper and lower extremities without any difficulty. There is moderate tenderness on palpation of the left lower ribs without obvious deformity. No soft tissue swelling present. Neurologic:  Normal speech and language. No gross focal neurologic deficits are appreciated. Skin:  Skin is warm, dry and intact. No abrasions, ecchymosis or erythema noted. Psychiatric: Mood and affect are normal. Speech and behavior are normal.  ____________________________________________   LABS (all labs ordered are listed, but only abnormal results are displayed)  Labs Reviewed - No data to display   RADIOLOGY  Dg Ribs Unilateral W/chest Left  Result Date: 06/12/2017 CLINICAL DATA:  Left rib pain EXAM: LEFT RIBS AND CHEST - 3+ VIEW COMPARISON:  Chest radiographs dated 03/09/2017 FINDINGS: Lungs are clear.  No pleural effusion or pneumothorax. The heart is normal in size. No displaced left rib fracture is seen. IMPRESSION: No evidence of acute cardiopulmonary disease. No displaced left rib fracture is seen. Electronically Signed   By: Julian Hy M.D.   On: 06/12/2017 08:38    ____________________________________________   PROCEDURES  Procedure(s) performed: None  Procedures  Critical Care performed: No  ____________________________________________   INITIAL IMPRESSION / ASSESSMENT AND PLAN / ED COURSE  As part of my medical decision making, I reviewed the following data within the electronic MEDICAL RECORD NUMBER Notes from prior ED visits and [place] Controlled Substance Database  Patient was given nebulized treatment while in the emergency department since he had not done so today. Patient was also given a prescription for nebulizer solution as his wife states that he has been reviewed. He will continue on his prednisone course as  prescribed by his physician. ____________________________________________   FINAL CLINICAL IMPRESSION(S) / ED DIAGNOSES  Final diagnoses:  Muscle strain of chest wall, initial encounter  Bronchitis     ED Discharge Orders        Ordered    albuterol (PROVENTIL) (2.5 MG/3ML) 0.083% nebulizer solution  Every 6 hours PRN    Comments:  PREVIOUSLY FILLED BY TIMOTHY GOLLAN.  06/12/17 0930       Note:  This document was prepared using Dragon voice recognition software and may include unintentional dictation errors.    Johnn Hai, PA-C 06/12/17 1014    Nance Pear, MD 06/12/17 [phone]

## 2017-06-12 NOTE — ED Notes (Signed)
FIRST NURSE NOTE: Pt dx with bronchitis on Friday, thinks he cracked a rib over night felt a pop.

## 2017-06-12 NOTE — ED Triage Notes (Signed)
Patient reports every time he coughs his L rib cage hurts. Denies any falls. Diagnosed with bronchitis a couple days ago and believes he heard something pop in his L rib cage when coughing the other night.

## 2017-06-12 NOTE — Discharge Instructions (Signed)
Follow up with your primary care doctor or pulmonologist if any continued problems. Continue medication of tapering your prednisone. Use nebulizer solution every 6 hours if needed for cough or wheezing.

## 2017-06-14 ENCOUNTER — Other Ambulatory Visit: Payer: Self-pay

## 2017-06-14 NOTE — Patient Outreach (Signed)
Patient was outreached due to recent ED visit. Left confidential message for patient to call back for additional [facility] information if needed. Magnet, know before you go, and unsuccessful letter mailed on 06/14/17.

## 2017-06-21 DIAGNOSIS — R0609 Other forms of dyspnea: Secondary | ICD-10-CM | POA: Diagnosis not present

## 2017-06-21 DIAGNOSIS — R05 Cough: Secondary | ICD-10-CM | POA: Diagnosis not present

## 2017-06-21 DIAGNOSIS — J449 Chronic obstructive pulmonary disease, unspecified: Secondary | ICD-10-CM | POA: Diagnosis not present

## 2017-07-01 ENCOUNTER — Telehealth: Payer: Self-pay | Admitting: Cardiovascular Disease

## 2017-07-01 ENCOUNTER — Other Ambulatory Visit: Payer: Self-pay

## 2017-07-01 MED ORDER — EZETIMIBE 10 MG PO TABS: 10.0000 mg | ORAL_TABLET | Freq: Every day | ORAL | 3 refills | Status: DC

## 2017-07-01 NOTE — Telephone Encounter (Signed)
°*  STAT* If patient is at the pharmacy, call can be transferred to refill team.   1. Which medications need to be refilled? (please list name of each medication and dose if known) ezetimibe (ZETIA) 10 MG 1 tablet daily  2. Which pharmacy/location (including street and city if local pharmacy) is medication to be sent to? [facility] in [place]  3. Do they need a 30 day or 90 day supply? 90 day

## 2017-07-01 NOTE — Telephone Encounter (Signed)
Requested Prescriptions   Signed Prescriptions Disp Refills  . ezetimibe (ZETIA) 10 MG tablet 90 tablet 3    Sig: Take 1 tablet (10 mg total) by mouth daily.    Authorizing Provider: Minna Merritts    Ordering User: [name]

## 2017-07-08 DIAGNOSIS — J449 Chronic obstructive pulmonary disease, unspecified: Secondary | ICD-10-CM | POA: Diagnosis not present

## 2017-07-11 ENCOUNTER — Telehealth: Payer: Self-pay | Admitting: Cardiovascular Disease

## 2017-07-11 NOTE — Telephone Encounter (Signed)
Pt spouse calling stating last time they saw Dr Rockey Situ he suggested a back doctor out of [place]   They are calling to get the names again Please call back

## 2017-07-11 NOTE — Telephone Encounter (Signed)
Spoke with patients wife per release form and provided her with number to [facility] in [place]. She reports that patient is going to see his PCP tomorrow and they will ask them if referral is necessary for appointment. She was appreciative for the call back with no further questions.

## 2017-07-12 DIAGNOSIS — J449 Chronic obstructive pulmonary disease, unspecified: Secondary | ICD-10-CM | POA: Diagnosis not present

## 2017-07-12 DIAGNOSIS — Z79899 Other long term (current) drug therapy: Secondary | ICD-10-CM | POA: Diagnosis not present

## 2017-07-12 DIAGNOSIS — N183 Chronic kidney disease, stage 3 (moderate): Secondary | ICD-10-CM | POA: Diagnosis not present

## 2017-07-12 DIAGNOSIS — I10 Essential (primary) hypertension: Secondary | ICD-10-CM | POA: Diagnosis not present

## 2017-07-12 DIAGNOSIS — F419 Anxiety disorder, unspecified: Secondary | ICD-10-CM | POA: Diagnosis not present

## 2017-07-12 DIAGNOSIS — E782 Mixed hyperlipidemia: Secondary | ICD-10-CM | POA: Diagnosis not present

## 2017-07-12 DIAGNOSIS — Z6830 Body mass index (BMI) 30.0-30.9, adult: Secondary | ICD-10-CM | POA: Diagnosis not present

## 2017-07-22 ENCOUNTER — Other Ambulatory Visit: Payer: Self-pay

## 2017-07-22 NOTE — Addendum Note (Signed)
Addended by: Jone Baseman on: 07/22/2017 10:06 AM   Modules accepted: Orders

## 2017-07-22 NOTE — Progress Notes (Signed)
This encounter was created in error - please disregard.

## 2017-07-22 NOTE — Patient Outreach (Signed)
[place] [facility]) Care Management  07/22/2017  William Mueller 05/17/1951 [id number]   Referral Date: 07/21/17 Referral Source: Nurseline Referral Reason: Lump to left side of neck that has been there for 1 month comes and goes.  Causes problems with swallowing.   Outreach Attempt: #1 Spoke with wife William Mueller who is DPR.  Patient in the background but did not wish to talk on the phone.  Wife called the nurseline yesterday as patient has had a lump on the left side of his neck that comes and goes and it causes some difficulty with swallowing.  Patient has not followed up with primary physician yet as patient had some issues with his back.  Advised to follow up with physician.    Social:Patient lives in the home with his wife. She assists him with his care.  She states that the patient is able to bathe and dress himself but takes much of his energy.  Conditions:  Patient has COPD, HTN, History of Stent placement 2015, some kidney failure, and some depression.  Wife shares that patient has some problems with his back and was supposed to see a back specialist yesterday but could no make it due to his problems swallowing.  Patient appointment rescheduled for 08-04-17.  Patient has long standing COPD and uses maintenance inhalers, oxygen and nebulizer.  Oxygen use is normally when he goes out per wife.  Wife feels she needs help and support with patient and his chronic conditions.  She states she likes to be able to ask questions if needed about patient.     Medications: Patient takes his medications with no problems. Wife reports he is able to afford his medication until he falls in the donut hole. She reports that they use medication management program during the period of being in the donut hole.     Appointments:  Patient saw primary care physician about 3 weeks ago.  Wife takes patient to his appointments.     Advanced Directives: Patient does not have an advanced directive does not  wish to complete one at this time.    Consent: Discussed with wife [facility] services and she is agreeable to services of health coach.    Plan: RN CM will refer patient to health coach for disease management and support for COPD.   RN CM will sign off at this time.     Jone Baseman, RN, MSN [facility] Care Management Care Management Coordinator Direct Line [phone] Toll Free: [phone]  Fax: [phone]

## 2017-07-25 ENCOUNTER — Encounter: Payer: Self-pay | Admitting: *Deleted

## 2017-07-26 DIAGNOSIS — H353211 Exudative age-related macular degeneration, right eye, with active choroidal neovascularization: Secondary | ICD-10-CM | POA: Diagnosis not present

## 2017-07-27 ENCOUNTER — Other Ambulatory Visit: Payer: Self-pay | Admitting: *Deleted

## 2017-07-27 NOTE — Patient Outreach (Signed)
[place] [facility]) Care Management  07/27/2017  BRIANT ANGELILLO 15-Apr-1951 [id number]   [place] Introductory Outreach  Referral Date:  07/22/2017 Referral Source:  Nurse Line Reason for Referral:  Lump to left side of neck that has been there for 1 month comes and goes. Causing problems with swallowing. Insurance:  [employer]   Outreach Attempt:  Outreach attempt #1 to patient for introduction call. No answer. RN Health Coach left HIPAA compliant voicemail message along with contact information.  Plan:  RN Health Coach will make another outreach attempt to patient within one business day if no return call back from patient.   [place] [phone] [email or web address]

## 2017-07-28 ENCOUNTER — Other Ambulatory Visit: Payer: Self-pay | Admitting: *Deleted

## 2017-07-28 NOTE — Patient Outreach (Signed)
[facility]) Care Management  07/28/2017  William Mueller [birth year] [id number]    [place] Introductory Outreach  Referral Date:  07/22/2017 Referral Source:  Nurse Line Reason for Referral:  Lump to left side of neck that has been there for 1 month comes and goes. Causing problems with swallowing. Insurance:  [employer]   Outreach Attempt:   Successful telephone outreach to patient for introduction and to arrange time for initial telephone assessment.  HIPAA verified with patient.  RN Health Coach introduced self and explained role.  Patient verbally consented to monthly telephone outreaches.  Plan:  RN Health Coach will outreach to patient within the next 10 business days to complete initial telephone assessment.  [place] [phone] [email or web address]

## 2017-08-02 DIAGNOSIS — H2511 Age-related nuclear cataract, right eye: Secondary | ICD-10-CM | POA: Diagnosis not present

## 2017-08-03 ENCOUNTER — Other Ambulatory Visit: Payer: Self-pay | Admitting: *Deleted

## 2017-08-03 ENCOUNTER — Encounter: Payer: Self-pay | Admitting: *Deleted
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
## 2017-08-04 ENCOUNTER — Encounter: Payer: Self-pay | Admitting: *Deleted

## 2017-08-04 ENCOUNTER — Other Ambulatory Visit: Payer: Self-pay

## 2017-08-04 ENCOUNTER — Other Ambulatory Visit: Payer: Self-pay | Admitting: *Deleted

## 2017-08-04 NOTE — Discharge Instructions (Signed)

## 2017-08-05 ENCOUNTER — Ambulatory Visit: Payer: Self-pay | Admitting: *Deleted

## 2017-08-05 ENCOUNTER — Other Ambulatory Visit: Payer: Self-pay | Admitting: *Deleted

## 2017-08-05 DIAGNOSIS — J449 Chronic obstructive pulmonary disease, unspecified: Secondary | ICD-10-CM | POA: Diagnosis not present
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
## 2017-08-09 ENCOUNTER — Other Ambulatory Visit: Payer: Self-pay | Admitting: *Deleted

## 2017-08-09 ENCOUNTER — Ambulatory Visit: Payer: Self-pay | Admitting: *Deleted

## 2017-08-09 NOTE — Patient Outreach (Signed)
[place] [facility]) Care Management  08/09/2017  William Mueller 1951/04/09 [id number]   [place] spoke with patient today by phone for follow up in regards to the plan for him to call his [place] regarding the home repairs. Patient reports he did not call; "I told you the wrong name for the agency". Reminded pt he had planned to call them and ask them to come out to assess and he states he did not do so; "they came out 2 years ago and said I had a $1500 deductible.   CSW has offered to do a home visit to further screen and assess. Pt agreeable to this plan- states he is having eye surgery tomorrow and agrees to have a visit later this week.  CSW offered to call him to confirm prior to visit.   Eduard Clos, MSW, [place] Worker  [place] [phone]

## 2017-08-10 ENCOUNTER — Ambulatory Visit: Payer: PPO | Admitting: Anesthesiology

## 2017-08-10 ENCOUNTER — Encounter: Payer: Self-pay | Admitting: *Deleted

## 2017-08-10 ENCOUNTER — Ambulatory Visit
Admission: RE | Admit: 2017-08-10 | Discharge: 2017-08-10 | Disposition: A | Discharge status: Home / Self Care | Payer: PPO | Source: Ambulatory Visit | Attending: Ophthalmology | Admitting: Ophthalmology

## 2017-08-10 ENCOUNTER — Encounter
Admission: RE | Disposition: A | Discharge status: Home / Self Care | Payer: Self-pay | Source: Ambulatory Visit | Attending: Ophthalmology

## 2017-08-10 DIAGNOSIS — E7800 Pure hypercholesterolemia, unspecified: Secondary | ICD-10-CM | POA: Insufficient documentation

## 2017-08-10 DIAGNOSIS — F17200 Nicotine dependence, unspecified, uncomplicated: Secondary | ICD-10-CM | POA: Insufficient documentation

## 2017-08-10 DIAGNOSIS — N183 Chronic kidney disease, stage 3 (moderate): Secondary | ICD-10-CM | POA: Insufficient documentation

## 2017-08-10 DIAGNOSIS — I252 Old myocardial infarction: Secondary | ICD-10-CM | POA: Insufficient documentation

## 2017-08-10 DIAGNOSIS — J449 Chronic obstructive pulmonary disease, unspecified: Secondary | ICD-10-CM | POA: Insufficient documentation

## 2017-08-10 DIAGNOSIS — Z7982 Long term (current) use of aspirin: Secondary | ICD-10-CM | POA: Diagnosis not present

## 2017-08-10 DIAGNOSIS — I1310 Hypertensive heart and chronic kidney disease without heart failure, with stage 1 through stage 4 chronic kidney disease, or unspecified chronic kidney disease: Secondary | ICD-10-CM | POA: Insufficient documentation

## 2017-08-10 DIAGNOSIS — Z79899 Other long term (current) drug therapy: Secondary | ICD-10-CM | POA: Insufficient documentation

## 2017-08-10 DIAGNOSIS — F329 Major depressive disorder, single episode, unspecified: Secondary | ICD-10-CM | POA: Diagnosis not present

## 2017-08-10 DIAGNOSIS — H2511 Age-related nuclear cataract, right eye: Secondary | ICD-10-CM | POA: Insufficient documentation

## 2017-08-10 DIAGNOSIS — I2510 Atherosclerotic heart disease of native coronary artery without angina pectoris: Secondary | ICD-10-CM | POA: Insufficient documentation

## 2017-08-10 HISTORY — DX: Acute myocardial infarction, unspecified: I21.9

## 2017-08-10 HISTORY — DX: Bronchitis, not specified as acute or chronic: J40

## 2017-08-10 HISTORY — DX: Chronic kidney disease, stage 3 (moderate): N18.3

## 2017-08-10 HISTORY — DX: Low back pain, unspecified: M54.50

## 2017-08-10 HISTORY — DX: Dyspnea, unspecified: R06.00

## 2017-08-10 HISTORY — DX: Orthopnea: R06.01

## 2017-08-10 HISTORY — PX: CATARACT EXTRACTION W/PHACO: SHX586

## 2017-08-10 HISTORY — DX: Chronic kidney disease, stage 3b: N18.32

## 2017-08-10 HISTORY — DX: Other chronic pain: G89.29

## 2017-08-10 HISTORY — DX: Unspecified hearing loss, unspecified ear: H91.90

## 2017-08-10 HISTORY — DX: Low back pain: M54.5

## 2017-08-10 SURGERY — PHACOEMULSIFICATION, CATARACT, WITH IOL INSERTION
Anesthesia: Monitor Anesthesia Care | Site: Eye | Laterality: Right | Wound class: Clean

## 2017-08-10 MED ORDER — MOXIFLOXACIN HCL 0.5 % OP SOLN
1.0000 [drp] | OPHTHALMIC | Status: DC | PRN
  Administered 2017-08-10 (×3): 1 [drp] via OPHTHALMIC

## 2017-08-10 MED ORDER — NA HYALUR & NA CHOND-NA HYALUR 0.4-0.35 ML IO KIT
PACK | INTRAOCULAR | Status: DC | PRN
  Administered 2017-08-10: 1 mL via INTRAOCULAR

## 2017-08-10 MED ORDER — EPINEPHRINE PF 1 MG/ML IJ SOLN
INTRAOCULAR | Status: DC | PRN
  Administered 2017-08-10: 63 mL via OPHTHALMIC

## 2017-08-10 MED ORDER — FENTANYL CITRATE (PF) 100 MCG/2ML IJ SOLN
INTRAMUSCULAR | Status: DC | PRN
  Administered 2017-08-10: 50 ug via INTRAVENOUS

## 2017-08-10 MED ORDER — CEFUROXIME OPHTHALMIC INJECTION 1 MG/0.1 ML
INJECTION | OPHTHALMIC | Status: DC | PRN
Start: 2017-08-10 — End: 2017-08-10
  Administered 2017-08-10: 0.1 mL via INTRACAMERAL

## 2017-08-10 MED ORDER — ACETAMINOPHEN 160 MG/5ML PO SOLN: 325.0000 mg | ORAL | Status: DC | PRN

## 2017-08-10 MED ORDER — BRIMONIDINE TARTRATE-TIMOLOL 0.2-0.5 % OP SOLN
OPHTHALMIC | Status: DC | PRN
  Administered 2017-08-10: 1 [drp] via OPHTHALMIC

## 2017-08-10 MED ORDER — MIDAZOLAM HCL 2 MG/2ML IJ SOLN
INTRAMUSCULAR | Status: DC | PRN
  Administered 2017-08-10 (×2): 1 mg via INTRAVENOUS

## 2017-08-10 MED ORDER — LIDOCAINE HCL (PF) 2 % IJ SOLN
INTRAOCULAR | Status: DC | PRN
  Administered 2017-08-10: 1 mL

## 2017-08-10 MED ORDER — ARMC OPHTHALMIC DILATING DROPS
1.0000 "application " | OPHTHALMIC | Status: DC | PRN
  Administered 2017-08-10 (×3): 1 via OPHTHALMIC

## 2017-08-10 MED ORDER — ACETAMINOPHEN 325 MG PO TABS: 325.0000 mg | ORAL_TABLET | ORAL | Status: DC | PRN

## 2017-08-10 SURGICAL SUPPLY — 27 items
CANNULA ANT/CHMB 27G (MISCELLANEOUS) ×1 IMPLANT
CANNULA ANT/CHMB 27GA (MISCELLANEOUS) ×3 IMPLANT
CARTRIDGE ABBOTT (MISCELLANEOUS) IMPLANT
GLOVE SURG LX 7.5 STRW (GLOVE) ×4
GLOVE SURG LX STRL 7.5 STRW (GLOVE) ×1 IMPLANT
GLOVE SURG TRIUMPH 8.0 PF LTX (GLOVE) ×3 IMPLANT
GOWN STRL REUS W/ TWL LRG LVL3 (GOWN DISPOSABLE) ×2 IMPLANT
GOWN STRL REUS W/TWL LRG LVL3 (GOWN DISPOSABLE) ×6
LENS IOL TECNIS ITEC 23.0 (Intraocular Lens) ×2 IMPLANT
MARKER SKIN DUAL TIP RULER LAB (MISCELLANEOUS) ×3 IMPLANT
NDL FILTER BLUNT 18X1 1/2 (NEEDLE) ×1 IMPLANT
NDL RETROBULBAR .5 NSTRL (NEEDLE) IMPLANT
NEEDLE FILTER BLUNT 18X 1/2SAF (NEEDLE) ×2
NEEDLE FILTER BLUNT 18X1 1/2 (NEEDLE) ×1 IMPLANT
PACK CATARACT BRASINGTON (MISCELLANEOUS) ×3 IMPLANT
PACK EYE AFTER SURG (MISCELLANEOUS) ×3 IMPLANT
PACK OPTHALMIC (MISCELLANEOUS) ×3 IMPLANT
RING MALYGIN 7.0 (MISCELLANEOUS) IMPLANT
SUT ETHILON 10-0 CS-B-6CS-B-6 (SUTURE)
SUT VICRYL  9 0 (SUTURE)
SUT VICRYL 9 0 (SUTURE) IMPLANT
SUTURE EHLN 10-0 CS-B-6CS-B-6 (SUTURE) IMPLANT
SYR 3ML LL SCALE MARK (SYRINGE) ×3 IMPLANT
SYR 5ML LL (SYRINGE) ×3 IMPLANT
SYR TB 1ML LUER SLIP (SYRINGE) ×3 IMPLANT
WATER STERILE IRR 500ML POUR (IV SOLUTION) ×3 IMPLANT
WIPE NON LINTING 3.25X3.25 (MISCELLANEOUS) ×3 IMPLANT

## 2017-08-10 NOTE — Anesthesia Preprocedure Evaluation (Addendum)
Anesthesia Evaluation  Patient identified by MRN, date of birth, ID band Patient awake    Reviewed: Allergy & Precautions, H&P , NPO status , Patient's Chart, lab work & pertinent test results, reviewed documented beta blocker date and time   Airway Mallampati: II  TM Distance: >3 FB Neck ROM: full    Dental no notable dental hx.    Pulmonary sleep apnea , COPD, Current Smoker,    Pulmonary exam normal breath sounds clear to auscultation       Cardiovascular Exercise Tolerance: Good hypertension, + CAD and + Past MI   Rhythm:regular Rate:Normal     Neuro/Psych  Headaches, negative psych ROS   GI/Hepatic Neg liver ROS, GERD  ,  Endo/Other  negative endocrine ROS  Renal/GU negative Renal ROS  negative genitourinary   Musculoskeletal   Abdominal   Peds  Hematology negative hematology ROS (+)   Anesthesia Other Findings   Reproductive/Obstetrics negative OB ROS                             Anesthesia Physical Anesthesia Plan  ASA: III  Anesthesia Plan: MAC   Post-op Pain Management:    Induction:   PONV Risk Score and Plan:   Airway Management Planned:   Additional Equipment:   Intra-op Plan:   Post-operative Plan:   Informed Consent: I have reviewed the patients History and Physical, chart, labs and discussed the procedure including the risks, benefits and alternatives for the proposed anesthesia with the patient or authorized representative who has indicated his/her understanding and acceptance.   Dental Advisory Given  Plan Discussed with: CRNA  Anesthesia Plan Comments:        Anesthesia Quick Evaluation

## 2017-08-10 NOTE — Transfer of Care (Signed)
Immediate Anesthesia Transfer of Care Note  Patient: William Mueller  Procedure(s) Performed: CATARACT EXTRACTION PHACO AND INTRAOCULAR LENS PLACEMENT (IOC) RIGHT (Right Eye)  Patient Location: PACU  Anesthesia Type: MAC  Level of Consciousness: awake, alert  and patient cooperative  Airway and Oxygen Therapy: Patient Spontanous Breathing and Patient connected to supplemental oxygen  Post-op Assessment: Post-op Vital signs reviewed, Patient's Cardiovascular Status Stable, Respiratory Function Stable, Patent Airway and No signs of Nausea or vomiting  Post-op Vital Signs: Reviewed and stable  Complications: No apparent anesthesia complications

## 2017-08-10 NOTE — Op Note (Signed)
LOCATION:  [place]   PREOPERATIVE DIAGNOSIS:    Nuclear sclerotic cataract right eye. H25.11   POSTOPERATIVE DIAGNOSIS:  Nuclear sclerotic cataract right eye.     PROCEDURE:  Phacoemusification with posterior chamber intraocular lens placement of the right eye   LENS:   Implant Name Type Inv. Item Serial No. Manufacturer Lot No. LRB No. Used  LENS IOL DIOP 23.0 - [id number] Intraocular Lens LENS IOL DIOP 23.0 [id number]  Right 1        ULTRASOUND TIME: 14 % of 1 minutes, 26 seconds.  CDE 11.9   SURGEON:  Wyonia Hough, MD   ANESTHESIA:  Topical with tetracaine drops and 2% Xylocaine jelly, augmented with 1% preservative-free intracameral lidocaine.    COMPLICATIONS:  None.   DESCRIPTION OF PROCEDURE:  The patient was identified in the holding room and transported to the operating room and placed in the supine position under the operating microscope.  The right eye was identified as the operative eye and it was prepped and draped in the usual sterile ophthalmic fashion.   A 1 millimeter clear-corneal paracentesis was made at the 12:00 position.  0.5 ml of preservative-free 1% lidocaine was injected into the anterior chamber. The anterior chamber was filled with Viscoat viscoelastic.  A 2.4 millimeter keratome was used to make a near-clear corneal incision at the 9:00 position.  A curvilinear capsulorrhexis was made with a cystotome and capsulorrhexis forceps.  Balanced salt solution was used to hydrodissect and hydrodelineate the nucleus.   Phacoemulsification was then used in stop and chop fashion to remove the lens nucleus and epinucleus.  The remaining cortex was then removed using the irrigation and aspiration handpiece. Provisc was then placed into the capsular bag to distend it for lens placement.  A lens was then injected into the capsular bag.  The remaining viscoelastic was aspirated.   Wounds were hydrated with balanced salt solution.  The anterior  chamber was inflated to a physiologic pressure with balanced salt solution.  No wound leaks were noted. Cefuroxime 0.1 ml of a 10mg /ml solution was injected into the anterior chamber for a dose of 1 mg of intracameral antibiotic at the completion of the case.   Timolol and Brimonidine drops were applied to the eye.  The patient was taken to the recovery room in stable condition without complications of anesthesia or surgery.   Haydan Wedig 08/10/2017, 10:14 AM

## 2017-08-10 NOTE — Anesthesia Procedure Notes (Signed)
Procedure Name: MAC Date/Time: 08/10/2017 9:55 AM Performed by: Cameron Ali, CRNA Pre-anesthesia Checklist: Patient identified, Emergency Drugs available, Suction available, Timeout performed and Patient being monitored Patient Re-evaluated:Patient Re-evaluated prior to induction Oxygen Delivery Method: Nasal cannula Placement Confirmation: positive ETCO2

## 2017-08-10 NOTE — Anesthesia Postprocedure Evaluation (Signed)
Anesthesia Post Note  Patient: William Mueller  Procedure(s) Performed: CATARACT EXTRACTION PHACO AND INTRAOCULAR LENS PLACEMENT (IOC) RIGHT (Right Eye)  Patient location during evaluation: PACU Anesthesia Type: MAC Level of consciousness: awake and alert Pain management: pain level controlled Vital Signs Assessment: post-procedure vital signs reviewed and stable Respiratory status: spontaneous breathing, nonlabored ventilation, respiratory function stable and patient connected to nasal cannula oxygen Cardiovascular status: stable and blood pressure returned to baseline Postop Assessment: no apparent nausea or vomiting Anesthetic complications: no    Alisa Graff

## 2017-08-10 NOTE — H&P (Signed)
The History and Physical notes are on paper, have been signed, and are to be scanned. The patient remains stable and unchanged from the H&P.   Previous H&P reviewed, patient examined, and there are no changes.  William Mueller 08/10/2017 8:54 AM

## 2017-08-12 ENCOUNTER — Other Ambulatory Visit: Payer: Self-pay | Admitting: *Deleted
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
## 2017-08-17 DIAGNOSIS — M545 Low back pain: Secondary | ICD-10-CM | POA: Diagnosis not present

## 2017-08-17 DIAGNOSIS — Z6830 Body mass index (BMI) 30.0-30.9, adult: Secondary | ICD-10-CM | POA: Diagnosis not present

## 2017-08-17 DIAGNOSIS — I10 Essential (primary) hypertension: Secondary | ICD-10-CM | POA: Diagnosis not present

## 2017-08-17 DIAGNOSIS — M5442 Lumbago with sciatica, left side: Secondary | ICD-10-CM | POA: Diagnosis not present

## 2017-08-18 ENCOUNTER — Other Ambulatory Visit: Payer: Self-pay | Admitting: Physician Assistant

## 2017-08-18 DIAGNOSIS — M5442 Lumbago with sciatica, left side: Secondary | ICD-10-CM

## 2017-08-23 ENCOUNTER — Other Ambulatory Visit: Payer: Self-pay | Admitting: *Deleted

## 2017-08-24 NOTE — Patient Outreach (Signed)
[facility]) Care Management  [facility] Social Work  08/24/2017  William Mueller 24-Apr-1951 [id number]  Subjective:  "we have someone coming to look at roof today"  Objective: [facility] CSW to assist patient and family with community based resources to aide in their well-being, quality of life and overall safety and needs.     Current Medications:  Current Outpatient Medications  Medication Sig Dispense Refill  . acetaminophen (TYLENOL) 500 MG tablet Take 1,000 mg by mouth daily as needed for headache.     . albuterol (PROVENTIL HFA;VENTOLIN HFA) 108 (90 Base) MCG/ACT inhaler Inhale into the lungs every 6 (six) hours as needed for wheezing or shortness of breath.    Marland Kitchen albuterol (PROVENTIL) (2.5 MG/3ML) 0.083% nebulizer solution Take 3 mLs (2.5 mg total) by nebulization every 6 (six) hours as needed for wheezing or shortness of breath. 30 vial 0  . ALPRAZolam (XANAX XR) 3 MG 24 hr tablet Take 3 mg by mouth every evening.     Marland Kitchen ALPRAZolam (XANAX) 1 MG tablet Take 1 mg by mouth daily.     Marland Kitchen amLODipine (NORVASC) 5 MG tablet Take 1 tablet (5 mg total) by mouth daily. (Patient taking differently: Take 5 mg by mouth daily. LUNCH) 90 tablet 3  . aspirin 81 MG EC tablet Take 1 tablet (81 mg total) by mouth daily. (Patient taking differently: Take 81 mg by mouth daily. BREAKFAST) 90 tablet 3  . atorvastatin (LIPITOR) 80 MG tablet Take 1 tablet (80 mg total) by mouth daily. (Patient taking differently: Take 80 mg by mouth daily. DINNER) 90 tablet 3  . budesonide-formoterol (SYMBICORT) 160-4.5 MCG/ACT inhaler Inhale 2 puffs into the lungs 2 (two) times daily. (Patient taking differently: Inhale 2 puffs into the lungs 2 (two) times daily. BREAKFAST AND BEDTIME) 3 Inhaler 1  . ezetimibe (ZETIA) 10 MG tablet Take 1 tablet (10 mg total) by mouth daily. (Patient taking differently: Take 10 mg by mouth daily. DINNER) 90 tablet 3  . furosemide (LASIX) 20 MG tablet Take 20 mg by mouth daily. 1qam    .  hydrALAZINE (APRESOLINE) 25 MG tablet Take 1 tablet (25 mg total) by mouth 3 (three) times daily as needed. (Patient not taking: Reported on 08/03/2017) 270 tablet 3  . metoprolol succinate (TOPROL-XL) 50 MG 24 hr tablet TAKE ONE TABLET EVERY DAY WITH A MEAL (Patient taking differently: TAKE ONE TABLET EVERY DAY WITH A MEAL/ AM) 30 tablet 2  . NITROSTAT 0.4 MG SL tablet PLACE 1 TABLET UNDER TONGUE EVERY 5 MINUTES AS NEEDED FOR CHEST PAIN.CALL 911 AFTER 3RD TABLET. 25 tablet 1  . omeprazole (PRILOSEC) 40 MG capsule Take 1 capsule (40 mg total) by mouth daily. (Patient taking differently: Take 40 mg by mouth daily. AM) 90 capsule 1  . OXYGEN Inhale 2 L into the lungs as needed.    . roflumilast (DALIRESP) 500 MCG TABS tablet Take 1 tablet (500 mcg total) by mouth daily. (Patient taking differently: Take 500 mcg by mouth daily. LUNCH) 90 tablet 1  . tiotropium (SPIRIVA) 18 MCG inhalation capsule Place 1 capsule (18 mcg total) into inhaler and inhale daily. (Patient taking differently: Place 18 mcg into inhaler and inhale daily. AM) 90 capsule 1  . VENTOLIN HFA 108 (90 Base) MCG/ACT inhaler INHALE 1 TO 2 PUFFS INTO THE LUNGS EVERY 6 HOURS AS NEEDED FOR WHEEZING OR SHORTNESS OF BREATH. 54 g 0   No current facility-administered medications for this visit.     Functional Status:  In your present state of health, do you have any difficulty performing the following activities: 08/10/2017 08/03/2017  Hearing? N Y  Comment - hard of hearing in left ear  Vision? N Y  Comment - right eye cataract  Difficulty concentrating or making decisions? N N  Walking or climbing stairs? N N  Dressing or bathing? N Y  Doing errands, shopping? - Y  Preparing Food and eating ? - Y  Using the Toilet? - N  In the past six months, have you accidently leaked urine? - N  Do you have problems with loss of bowel control? - N  Managing your Medications? - Y  Managing your Finances? - N  Housekeeping or managing your  Housekeeping? - Y  Some recent data might be hidden    Fall/Depression Screening:  Fall Risk  08/03/2017  Falls in the past year? No   PHQ 2/9 Scores 08/03/2017 07/22/2017  PHQ - 2 Score 0 2  PHQ- 9 Score - 14    Assessment:  CSW spoke with patient's wife by phone for f/u. She reports they did contact the insurance and expect someone to come look at the roof for options.    Plan:  CSW will close referral at this time due to no further needs or support to offer for home repairs aside from the resources provided.   CSW will advise PCP and [employer] team.   Eduard Clos, MSW, [place] Worker  [place] [phone]

## 2017-08-31 ENCOUNTER — Ambulatory Visit
Admission: RE | Admit: 2017-08-31 | Discharge: 2017-08-31 | Disposition: A | Discharge status: Home / Self Care | Payer: PPO | Source: Ambulatory Visit | Attending: Physician Assistant | Admitting: Physician Assistant

## 2017-08-31 DIAGNOSIS — Z6829 Body mass index (BMI) 29.0-29.9, adult: Secondary | ICD-10-CM | POA: Diagnosis not present

## 2017-08-31 DIAGNOSIS — M48061 Spinal stenosis, lumbar region without neurogenic claudication: Secondary | ICD-10-CM | POA: Diagnosis not present

## 2017-08-31 DIAGNOSIS — I10 Essential (primary) hypertension: Secondary | ICD-10-CM | POA: Diagnosis not present

## 2017-08-31 DIAGNOSIS — M79604 Pain in right leg: Secondary | ICD-10-CM | POA: Diagnosis not present

## 2017-08-31 DIAGNOSIS — M5442 Lumbago with sciatica, left side: Secondary | ICD-10-CM

## 2017-08-31 DIAGNOSIS — M79605 Pain in left leg: Secondary | ICD-10-CM | POA: Diagnosis not present

## 2017-09-01 ENCOUNTER — Encounter: Payer: Self-pay | Admitting: *Deleted

## 2017-09-01 ENCOUNTER — Other Ambulatory Visit: Payer: Self-pay | Admitting: *Deleted

## 2017-09-01 NOTE — Patient Outreach (Signed)
[facility]) Care Management  09/01/2017  William Mueller 17-Jul-1950 [id number]   [place] Monthly Outreach  Referral Date:  07/22/2017 Referral Source:  Nurse Line Reason for Referral:  Lump to left side of neck that has been there for 1 month comes and goes.  Causing problems with swallowing Insurance:  [employer]   Outreach Attempt:  Successful telephone outreach to patient for monthly follow up.  Patient requesting RN Health Coach speak with wife.  HIPAA verified with wife per patient's request.  Wife reports patient's breathing continues to do well.  Stating he still only wears his oxygen when needed.  Denies any need for his rescue inhaler in the past week or COPD exacerbations in the past month.  Reporting his main concern is his continued back pain with lower extremity weakness and toe numbness.  Currently being evaluated by a physician at [facility].  Continues to be able to walk only short distances, denies any falls.  Wife continues to deny any difficulties with affording medications at this time.  Reports patient having cataract surgery on 08/10/2017 and is healing well from this procedure.  Wife states she has received resources from [place] for roofing and mold issues and she is currently reviewing those resources provided.  Reports they have an insurance adjuster coming out on Monday to evaluate the roof.  Patient stating he continues to have lump in his neck and throat at times.  Verbalizes he did not mention to neurosurgeon nor has he spoken with NP Lam.  Also reporting some issues with constipation and difficulties voiding.  Patient encouraged to speak with NP Lam for possible appointment for evaluation.  Appointments:  Reports his follow up appointment with primary care provider NP Lam is due in June or July, and still needs to arrange the appointment.  Patient and wife encouraged to seek assistance for sooner medical follow up  for concerns of lump in throat, constipation, and trouble voiding.  Scheduled follow up appointments with Dr. Raul Del, Pulmonology on 09/08/2017 and eye physician on 09/29/2017.  Plan:  RN Health Coach will make next monthly outreach to patient in the month of April.   [place] [phone] [email or web address]

## 2017-09-05 ENCOUNTER — Other Ambulatory Visit (INDEPENDENT_AMBULATORY_CARE_PROVIDER_SITE_OTHER): Payer: Self-pay | Admitting: Neurosurgery

## 2017-09-05 DIAGNOSIS — J449 Chronic obstructive pulmonary disease, unspecified: Secondary | ICD-10-CM | POA: Diagnosis not present

## 2017-09-05 DIAGNOSIS — M79606 Pain in leg, unspecified: Secondary | ICD-10-CM

## 2017-09-07 ENCOUNTER — Ambulatory Visit (INDEPENDENT_AMBULATORY_CARE_PROVIDER_SITE_OTHER): Payer: PPO

## 2017-09-07 DIAGNOSIS — M79605 Pain in left leg: Secondary | ICD-10-CM | POA: Diagnosis not present

## 2017-09-07 DIAGNOSIS — M79604 Pain in right leg: Secondary | ICD-10-CM

## 2017-09-07 DIAGNOSIS — M79606 Pain in leg, unspecified: Secondary | ICD-10-CM

## 2017-09-12 NOTE — Telephone Encounter (Signed)
05/21/2016 4:16:51 PM - [facility] refill  05/21/16 Called [place] for ref[place] allow 7-10 days to receive., last fill, patient enrollment ends 06/06/16.

## 2017-09-14 DIAGNOSIS — Z6829 Body mass index (BMI) 29.0-29.9, adult: Secondary | ICD-10-CM | POA: Diagnosis not present

## 2017-09-14 DIAGNOSIS — R29898 Other symptoms and signs involving the musculoskeletal system: Secondary | ICD-10-CM | POA: Diagnosis not present

## 2017-09-14 DIAGNOSIS — I10 Essential (primary) hypertension: Secondary | ICD-10-CM | POA: Diagnosis not present

## 2017-09-20 DIAGNOSIS — H353211 Exudative age-related macular degeneration, right eye, with active choroidal neovascularization: Secondary | ICD-10-CM | POA: Diagnosis not present

## 2017-09-20 DIAGNOSIS — H43813 Vitreous degeneration, bilateral: Secondary | ICD-10-CM | POA: Diagnosis not present

## 2017-09-28 ENCOUNTER — Encounter

## 2017-09-28 ENCOUNTER — Other Ambulatory Visit: Payer: Self-pay | Admitting: *Deleted

## 2017-09-28 ENCOUNTER — Ambulatory Visit (INDEPENDENT_AMBULATORY_CARE_PROVIDER_SITE_OTHER): Payer: PPO | Admitting: Urology

## 2017-09-28 ENCOUNTER — Encounter: Payer: Self-pay | Admitting: *Deleted

## 2017-09-28 ENCOUNTER — Encounter: Payer: Self-pay | Admitting: Urology

## 2017-09-28 VITALS — BP 114/75 | HR 108 | Ht 69.0 in | Wt 202.0 lb

## 2017-09-28 DIAGNOSIS — N138 Other obstructive and reflux uropathy: Secondary | ICD-10-CM

## 2017-09-28 DIAGNOSIS — N401 Enlarged prostate with lower urinary tract symptoms: Secondary | ICD-10-CM

## 2017-09-28 DIAGNOSIS — N5203 Combined arterial insufficiency and corporo-venous occlusive erectile dysfunction: Secondary | ICD-10-CM

## 2017-09-28 LAB — BLADDER SCAN AMB NON-IMAGING

## 2017-09-28 LAB — URINALYSIS, COMPLETE
Bilirubin, UA: NEGATIVE
Glucose, UA: NEGATIVE
KETONES UA: NEGATIVE
Leukocytes, UA: NEGATIVE
NITRITE UA: NEGATIVE
Protein, UA: NEGATIVE
RBC, UA: NEGATIVE
Specific Gravity, UA: 1.015 (ref 1.005–1.030)
Urobilinogen, Ur: 0.2 mg/dL (ref 0.2–1.0)
pH, UA: 6.5 (ref 5.0–7.5)

## 2017-09-28 MED ORDER — TAMSULOSIN HCL 0.4 MG PO CAPS: 0.4000 mg | ORAL_CAPSULE | Freq: Every day | ORAL | 11 refills | Status: DC

## 2017-09-28 NOTE — Patient Outreach (Signed)
[facility] [facility]) Care Management  09/28/2017  William Mueller 10-22-1950 [id number]   [place] Monthly Outreach  Referral Date:  07/22/2017 Referral Source:  Nurse Line Reason for Referral:  Lump to left side of neck that has been there for 1 month comes and goes.  Causing problems with swallowing Insurance:  [employer]   Outreach Attempt:  Successful telephone outreach to patient for monthly follow up.  Patient request RN Health Coach speak with wife.  HIPAA verified with wife per patient request.  Wife reports patient continues to have pain and weakness in bilateral lower extremities, preventing him from standing for extended periods of time.  Denies any falls.  States he is due to see a Neurologist next week in consult for this issue.  Reports he has been taking Flexeril as needed to assist with the pain and weakness in his legs.  Wife also reports patient having difficulties with voiding and has just returned from seeing a Dealer.  States patient's breathing has been doing well.  Wife endorses patient has not needed to use his oxygen in the last month or so.  Denies any shortness of breath or the need for his rescue inhaler.  Appointments:  Patient last saw his primary care provider, Charlott Holler in January 2019 and has scheduled follow up appointment on 11/29/2017.  Patient seen Urologist today and initiated on Flomax.  Has an eye appointment 09/29/2017 and sees Dr. Melrose Nakayama, Neurologist on 10/05/2017.  Plan:  RN Health Coach will make next monthly outreach to patient in the month of May.   [place] [phone] [email or web address]

## 2017-09-28 NOTE — Progress Notes (Signed)
09/28/2017 4:38 PM   William Mueller 1951-01-24 [id number]  Referring provider: Philmore Pali, NP [place], [place] [address]  Chief Complaint  Patient presents with  . Benign Prostatic Hypertrophy    HPI: 67 year old male who presents today for further evaluation of urianry symptoms.    He reports that he has had progressive urinnary symptoms over the past serveral months including hestancy and weak stream.  He feels on occasion like he is not completely emptying his bladder.  No significant urgency or frequency.  No dysuria or gross hematuria.  UA today is negative.  IPSS as below.  He does have chronic back pain radiating down to his legs.  He mentions that he had an MRI of the spine which was unremarkable and will now be seeing neurology.      No recent changes in this medications.  He was previously seen in our clinic in 2017 for refractory erectile dysfunction managed with intracavernosal injection.  Medical illness.  PSA 0.6 on 07/2015.  PVR 82 cc.    IPSS    Row Name 09/28/17 0800         International Prostate Symptom Score   How often have you had the sensation of not emptying your bladder?  About half the time     How often have you had to urinate less than every two hours?  About half the time     How often have you found you stopped and started again several times when you urinated?  About half the time     How often have you found it difficult to postpone urination?  Almost always     How often have you had a weak urinary stream?  More than half the time     How often have you had to strain to start urination?  More than half the time     How many times did you typically get up at night to urinate?  None     Total IPSS Score  22       Quality of Life due to urinary symptoms   If you were to spend the rest of your life with your urinary condition just the way it is now how would you feel about that?  Unhappy        Score:  1-7 Mild 8-19  Moderate 20-35 Severe    PMH: Past Medical History:  Diagnosis Date  . Anginal pain ([place]) 06/2017  . Anxiety   . Bronchitis 06/2017  . Chronic chest pain   . Chronic kidney disease (CKD) stage G3b/A1, moderately decreased glomerular filtration rate (GFR) between 30-44 mL/min/1.73 square meter and albuminuria creatinine ratio less than 30 mg/g (HCC)   . Chronic lower back pain    WITH LEG WEAKNESS  . COPD (chronic obstructive pulmonary disease) (HCC)    EMPHYSEMA, O2 AT 2L PRN  . Coronary artery disease    a. 12/2013 PCI: mRCA 100% with L to R collats s/p PCI/DES;  b. 06/2014 MV: no ischemia/infarct, EF 53%;  c. 03/2015 Cath: LM nl, LAD nl, D1 80 (1.52mm), LCX 6m (<1.34mm), OM1 nl, RCA 55p, patent stent, RPDA nl, EF 65%; d. 09/2015 Cath: LM nl, LAD 25m, D1 80 (<41mm), LCX 49m/d (<1.61mm), OM1 nl, RCA 55p (FFR 0.93), patent stent, RPDA nl-->Med Rx.  . Daily headache   . Depression   . Diastolic dysfunction    a. echo 12/2013: EF 55-60%, mild LVH, GR1DD, inf HK, elevated  CVP, mildly dilated IVC suggestive of increased RA pressure  . Dyspnea    WHEEZING  . GERD (gastroesophageal reflux disease)    REFLUX  . Hearing loss   . High cholesterol   . Hypertension   . Myocardial infarction ([place]) 2015  . Nicotine addiction    a. using eCigs.  . Orthopnea   . Sleep apnea     Surgical History: Past Surgical History:  Procedure Laterality Date  . CARDIAC CATHETERIZATION     MC x 1 stent  . CARDIAC CATHETERIZATION Left 03/12/2015   Procedure: Left Heart Cath and Coronary Angiography;  Surgeon: Leonie Man, MD;  Location: [place] [facility];  Service: Cardiovascular;  Laterality: Left;  . CARDIAC CATHETERIZATION N/A 09/29/2015   Procedure: Left Heart Cath and Coronary Angiography;  Surgeon: Wellington Hampshire, MD;  Location: [place] [facility];  Service: Cardiovascular;  Laterality: N/A;  . CATARACT EXTRACTION W/PHACO Right 08/10/2017   Procedure: CATARACT EXTRACTION PHACO AND INTRAOCULAR  LENS PLACEMENT ([place]) RIGHT;  Surgeon: Leandrew Koyanagi, MD;  Location: [facility];  Service: Ophthalmology;  Laterality: Right;  . CORONARY ANGIOPLASTY     STENT PLACEMENT  . LEFT HEART CATHETERIZATION WITH CORONARY ANGIOGRAM N/A 12/12/2013   Procedure: LEFT HEART CATHETERIZATION WITH CORONARY ANGIOGRAM;  Surgeon: Wellington Hampshire, MD;  Location: [place] [facility];  Service: Cardiovascular;  Laterality: N/A;    Home Medications:  Allergies as of 09/28/2017      Reactions   Benzodiazepines Shortness Of Breath   Patient is on Xanax   Paroxetine Hcl Shortness Of Breath, Palpitations   Serotonin Reuptake Inhibitors (ssris) Shortness Of Breath, Palpitations   Diazepam Other (See Comments)   Rapid heartrate   Doxycycline Monohydrate Other (See Comments)   Unknown allergic reaction   Escitalopram Oxalate Other (See Comments)   serotonin Syndrome   Isosorbide Nitrate Other (See Comments)   Feels like head was going to explode -- HA   Lorazepam Other (See Comments)   Unknown allergic reaction   Tetracyclines & Related Itching, Rash      Medication List        Accurate as of 09/28/17  4:38 PM. Always use your most recent med list.          acetaminophen 500 MG tablet Commonly known as:  TYLENOL Take 1,000 mg by mouth daily as needed for headache.   ALPRAZolam 3 MG 24 hr tablet Commonly known as:  XANAX XR Take 3 mg by mouth every evening.   amLODipine 5 MG tablet Commonly known as:  NORVASC Take 1 tablet (5 mg total) by mouth daily.   aspirin 81 MG EC tablet Take 1 tablet (81 mg total) by mouth daily.   atorvastatin 80 MG tablet Commonly known as:  LIPITOR Take 1 tablet (80 mg total) by mouth daily.   budesonide-formoterol 160-4.5 MCG/ACT inhaler Commonly known as:  SYMBICORT Inhale 2 puffs into the lungs 2 (two) times daily.   cyclobenzaprine 10 MG tablet Commonly known as:  FLEXERIL Take 10 mg by mouth 3 (three) times daily as needed for muscle spasms.     ezetimibe 10 MG tablet Commonly known as:  ZETIA Take 1 tablet (10 mg total) by mouth daily.   furosemide 20 MG tablet Commonly known as:  LASIX Take 20 mg by mouth daily. 1qam   hydrALAZINE 25 MG tablet Commonly known as:  APRESOLINE Take 1 tablet (25 mg total) by mouth 3 (three) times daily as needed.   metoprolol succinate  50 MG 24 hr tablet Commonly known as:  TOPROL-XL TAKE ONE TABLET EVERY DAY WITH A MEAL   NITROSTAT 0.4 MG SL tablet Generic drug:  nitroGLYCERIN PLACE 1 TABLET UNDER TONGUE EVERY 5 MINUTES AS NEEDED FOR CHEST PAIN.CALL 911 AFTER 3RD TABLET.   omeprazole 40 MG capsule Commonly known as:  PRILOSEC Take 1 capsule (40 mg total) by mouth daily.   OXYGEN Inhale 2 L into the lungs as needed.   roflumilast 500 MCG Tabs tablet Commonly known as:  DALIRESP Take 1 tablet (500 mcg total) by mouth daily.   tamsulosin 0.4 MG Caps capsule Commonly known as:  FLOMAX Take 1 capsule (0.4 mg total) by mouth daily.   tiotropium 18 MCG inhalation capsule Commonly known as:  SPIRIVA Place 1 capsule (18 mcg total) into inhaler and inhale daily.   albuterol 108 (90 Base) MCG/ACT inhaler Commonly known as:  PROVENTIL HFA;VENTOLIN HFA Inhale into the lungs every 6 (six) hours as needed for wheezing or shortness of breath.   VENTOLIN HFA 108 (90 Base) MCG/ACT inhaler Generic drug:  albuterol INHALE 1 TO 2 PUFFS INTO THE LUNGS EVERY 6 HOURS AS NEEDED FOR WHEEZING OR SHORTNESS OF BREATH.   albuterol (2.5 MG/3ML) 0.083% nebulizer solution Commonly known as:  PROVENTIL Take 3 mLs (2.5 mg total) by nebulization every 6 (six) hours as needed for wheezing or shortness of breath.       Allergies:  Allergies  Allergen Reactions  . Benzodiazepines Shortness Of Breath    Patient is on Xanax  . Paroxetine Hcl Shortness Of Breath and Palpitations  . Serotonin Reuptake Inhibitors (Ssris) Shortness Of Breath and Palpitations  . Diazepam Other (See Comments)    Rapid  heartrate  . Doxycycline Monohydrate Other (See Comments)    Unknown allergic reaction  . Escitalopram Oxalate Other (See Comments)    serotonin Syndrome  . Isosorbide Nitrate Other (See Comments)    Feels like head was going to explode -- HA  . Lorazepam Other (See Comments)    Unknown allergic reaction  . Tetracyclines & Related Itching and Rash    Family History: Family History  Problem Relation Age of Onset  . Coronary artery disease Brother   . Heart disease Brother 66  . Heart disease Father        MI x 8  . Kidney disease Neg Hx   . Prostate cancer Neg Hx     Social History:  reports that he has been smoking e-cigarettes.  He has smoked for the past 48.00 years. He has never used smokeless tobacco. He reports that he drinks about 3.6 oz of alcohol per week. He reports that he does not use drugs.  ROS: UROLOGY Frequent Urination?: Yes Hard to postpone urination?: No Burning/pain with urination?: No Get up at night to urinate?: No Leakage of urine?: No Urine stream starts and stops?: Yes Trouble starting stream?: Yes Do you have to strain to urinate?: Yes Blood in urine?: No Urinary tract infection?: No Sexually transmitted disease?: No Injury to kidneys or bladder?: No Painful intercourse?: No Weak stream?: Yes Erection problems?: No Penile pain?: No  Gastrointestinal Nausea?: No Vomiting?: No Indigestion/heartburn?: No Diarrhea?: No Constipation?: No  Constitutional Fever: No Night sweats?: No Weight loss?: No Fatigue?: No  Skin Skin rash/lesions?: No Itching?: No  Eyes Blurred vision?: No Double vision?: No  Ears/Nose/Throat Sore throat?: No Sinus problems?: No  Hematologic/Lymphatic Swollen glands?: No Easy bruising?: No  Cardiovascular Leg swelling?: No Chest pain?: No  Respiratory Cough?: No Shortness of breath?: No  Endocrine Excessive thirst?: No  Musculoskeletal Back pain?: No Joint pain?:  No  Neurological Headaches?: No Dizziness?: No  Psychologic Depression?: No Anxiety?: No  Physical Exam: BP 114/75   Pulse (!) 108   Ht 5\' 9"  (1.753 m)   Wt 202 lb (91.6 kg)   BMI 29.83 kg/m   Constitutional:  Alert and oriented, No acute distress. HEENT: [place] AT, moist mucus membranes.  Trachea midline, no masses. Cardiovascular: No clubbing, cyanosis, or edema. Respiratory: Normal respiratory effort, no increased work of breathing. GI: Abdomen is soft, nontender, nondistended, no abdominal masses, protuberant GU: No CVA tenderness Rectal: normal sphincter tone, small 30 cc prostate, nontender, no nodules Skin: No rashes, bruises or suspicious lesions. Neurologic: Grossly intact, no focal deficits, moving all 4 extremities. Psychiatric: Normal mood and affect.  Laboratory Data: Lab Results  Component Value Date   WBC 8.1 03/09/2017   HGB 14.0 03/09/2017   HCT 40.4 03/09/2017   MCV 83.5 03/09/2017   PLT 281 03/09/2017    Lab Results  Component Value Date   CREATININE 1.63 (H) 03/09/2017    Lab Results  Component Value Date   HGBA1C 5.5 12/11/2013    Urinalysis    Component Value Date/Time   COLORURINE STRAW (A) 02/23/2016 1722   APPEARANCEUR Clear 09/28/2017 0822   LABSPEC 1.005 02/23/2016 1722   LABSPEC 1.002 04/21/2013 1022   PHURINE 6.0 02/23/2016 1722   GLUCOSEU Negative 09/28/2017 0822   GLUCOSEU Negative 04/21/2013 1022   HGBUR NEGATIVE 02/23/2016 1722   BILIRUBINUR Negative 09/28/2017 0822   BILIRUBINUR Negative 04/21/2013 1022   KETONESUR NEGATIVE 02/23/2016 1722   PROTEINUR Negative 09/28/2017 0822   PROTEINUR NEGATIVE 02/23/2016 1722   UROBILINOGEN 0.2 11/22/2014 2213   NITRITE Negative 09/28/2017 0822   NITRITE NEGATIVE 02/23/2016 1722   LEUKOCYTESUR Negative 09/28/2017 0822   LEUKOCYTESUR Negative 04/21/2013 1022    Lab Results  Component Value Date   BACTERIA NONE SEEN 02/23/2016    Pertinent Imaging: Results for orders placed or  performed in visit on 09/28/17  Urinalysis, Complete  Result Value Ref Range   Specific Gravity, UA 1.015 1.005 - 1.030   pH, UA 6.5 5.0 - 7.5   Color, UA Yellow Yellow   Appearance Ur Clear Clear   Leukocytes, UA Negative Negative   Protein, UA Negative Negative/Trace   Glucose, UA Negative Negative   Ketones, UA Negative Negative   RBC, UA Negative Negative   Bilirubin, UA Negative Negative   Urobilinogen, Ur 0.2 0.2 - 1.0 mg/dL   Nitrite, UA Negative Negative  BLADDER SCAN AMB NON-IMAGING  Result Value Ref Range   Scan Result 31ml     Assessment & Plan:    1. BPH with urinary obstruction New onset obstructive urinary symptoms Rectal exam today unremarkable, prostate relatively small Given his significant medical comorbidities, will hold off on rechecking a PSA, quite low 2 years ago PVR today 82 cc which is adequate but not optimal Plan to start Flomax 0.4 mg and reassess in 3 months Relatively poor surgical candidate given his medical comorbidities and extensive cardiac and pulmonary disease - BLADDER SCAN AMB NON-IMAGING - Urinalysis, Complete  2. Combined arterial insufficiency and corporo-venous occlusive erectile dysfunction Previously using intracavernosal injection, has not been using it quite some time due to medical illness this medication in quite some time due to   Return in about 3 months (around 12/28/2017) for IPSS/ PVR.  Hollice Espy, MD  [facility] [address], [place], [place] [address] [phone]  I spent 25 min with this patient of which greater than 50% was spent in counseling and coordination of care with the patient.

## 2017-09-29 ENCOUNTER — Observation Stay (HOSPITAL_COMMUNITY)
Admission: EM | Admit: 2017-09-29 | Discharge: 2017-10-01 | Disposition: A | Discharge status: Home / Self Care | Payer: PPO | Attending: Internal Medicine | Admitting: Internal Medicine

## 2017-09-29 ENCOUNTER — Other Ambulatory Visit (HOSPITAL_COMMUNITY): Payer: Self-pay

## 2017-09-29 ENCOUNTER — Emergency Department (HOSPITAL_COMMUNITY): Payer: PPO

## 2017-09-29 ENCOUNTER — Other Ambulatory Visit: Payer: Self-pay

## 2017-09-29 ENCOUNTER — Encounter (HOSPITAL_COMMUNITY): Payer: Self-pay | Admitting: Family Medicine

## 2017-09-29 DIAGNOSIS — J449 Chronic obstructive pulmonary disease, unspecified: Secondary | ICD-10-CM | POA: Diagnosis not present

## 2017-09-29 DIAGNOSIS — F17290 Nicotine dependence, other tobacco product, uncomplicated: Secondary | ICD-10-CM | POA: Insufficient documentation

## 2017-09-29 DIAGNOSIS — Z79899 Other long term (current) drug therapy: Secondary | ICD-10-CM | POA: Diagnosis not present

## 2017-09-29 DIAGNOSIS — R000 Tachycardia, unspecified: Secondary | ICD-10-CM | POA: Diagnosis not present

## 2017-09-29 DIAGNOSIS — Z7982 Long term (current) use of aspirin: Secondary | ICD-10-CM | POA: Insufficient documentation

## 2017-09-29 DIAGNOSIS — I2510 Atherosclerotic heart disease of native coronary artery without angina pectoris: Secondary | ICD-10-CM | POA: Diagnosis not present

## 2017-09-29 DIAGNOSIS — I5030 Unspecified diastolic (congestive) heart failure: Secondary | ICD-10-CM | POA: Diagnosis not present

## 2017-09-29 DIAGNOSIS — N183 Chronic kidney disease, stage 3 unspecified: Secondary | ICD-10-CM | POA: Diagnosis present

## 2017-09-29 DIAGNOSIS — I130 Hypertensive heart and chronic kidney disease with heart failure and stage 1 through stage 4 chronic kidney disease, or unspecified chronic kidney disease: Secondary | ICD-10-CM | POA: Diagnosis not present

## 2017-09-29 DIAGNOSIS — I10 Essential (primary) hypertension: Secondary | ICD-10-CM | POA: Diagnosis present

## 2017-09-29 DIAGNOSIS — R55 Syncope and collapse: Secondary | ICD-10-CM | POA: Diagnosis not present

## 2017-09-29 DIAGNOSIS — I951 Orthostatic hypotension: Principal | ICD-10-CM | POA: Diagnosis present

## 2017-09-29 DIAGNOSIS — Z9861 Coronary angioplasty status: Secondary | ICD-10-CM

## 2017-09-29 DIAGNOSIS — R079 Chest pain, unspecified: Secondary | ICD-10-CM | POA: Diagnosis present

## 2017-09-29 DIAGNOSIS — E876 Hypokalemia: Secondary | ICD-10-CM | POA: Diagnosis present

## 2017-09-29 DIAGNOSIS — R0602 Shortness of breath: Secondary | ICD-10-CM | POA: Diagnosis not present

## 2017-09-29 LAB — BASIC METABOLIC PANEL
ANION GAP: 12 (ref 5–15)
BUN: 12 mg/dL (ref 6–20)
CHLORIDE: 101 mmol/L (ref 101–111)
CO2: 25 mmol/L (ref 22–32)
Calcium: 8.8 mg/dL — ABNORMAL LOW (ref 8.9–10.3)
Creatinine, Ser: 1.68 mg/dL — ABNORMAL HIGH (ref 0.61–1.24)
GFR calc non Af Amer: 40 mL/min — ABNORMAL LOW (ref >60)
GFR, EST AFRICAN AMERICAN: 47 mL/min — AB (ref >60)
GLUCOSE: 118 mg/dL — AB (ref 65–99)
Potassium: 3.1 mmol/L — ABNORMAL LOW (ref 3.5–5.1)
Sodium: 138 mmol/L (ref 135–145)

## 2017-09-29 LAB — CBC
HCT: 41.2 % (ref 39.0–52.0)
HEMOGLOBIN: 13.8 g/dL (ref 13.0–17.0)
MCH: 27.9 pg (ref 26.0–34.0)
MCHC: 33.5 g/dL (ref 30.0–36.0)
MCV: 83.4 fL (ref 78.0–100.0)
Platelets: 299 10*3/uL (ref 150–400)
RBC: 4.94 MIL/uL (ref 4.22–5.81)
RDW: 13 % (ref 11.5–15.5)
WBC: 9.8 10*3/uL (ref 4.0–10.5)

## 2017-09-29 LAB — I-STAT TROPONIN, ED: TROPONIN I, POC: 0 ng/mL (ref 0.00–0.08)

## 2017-09-29 MED ORDER — IOPAMIDOL (ISOVUE-370) INJECTION 76%
100.0000 mL | Freq: Once | INTRAVENOUS | Status: AC | PRN
  Administered 2017-09-29: 100 mL via INTRAVENOUS

## 2017-09-29 MED ORDER — ALPRAZOLAM 0.5 MG PO TABS
3.0000 mg | ORAL_TABLET | Freq: Once | ORAL | Status: AC
  Administered 2017-09-29: 3 mg via ORAL
  Filled 2017-09-29: qty 12

## 2017-09-29 MED ORDER — LACTATED RINGERS IV BOLUS
1000.0000 mL | Freq: Once | INTRAVENOUS | Status: AC
  Administered 2017-09-29: 1000 mL via INTRAVENOUS

## 2017-09-29 MED ORDER — POTASSIUM CHLORIDE 10 MEQ/100ML IV SOLN
10.0000 meq | Freq: Once | INTRAVENOUS | Status: AC
  Administered 2017-09-30: 10 meq via INTRAVENOUS
  Filled 2017-09-29: qty 100

## 2017-09-29 MED ORDER — IOPAMIDOL (ISOVUE-370) INJECTION 76%
INTRAVENOUS | Status: AC
  Filled 2017-09-29: qty 100

## 2017-09-29 MED ORDER — SODIUM CHLORIDE 0.9 % IV BOLUS
1000.0000 mL | Freq: Once | INTRAVENOUS | Status: AC
  Administered 2017-09-29: 1000 mL via INTRAVENOUS

## 2017-09-29 NOTE — ED Notes (Signed)
Patient transported to X-ray 

## 2017-09-29 NOTE — ED Triage Notes (Signed)
PT to ED via [facility] from home. Pt sts 1500 started feeling not well, lightheaded, dizzy, diaphoretic  like going to pass out. No cp until EMS arrival. 2/10, substernal, non radiating, pale. ST per EMS 140s with occasional PVC's. 324 ASA, 1 in Nitro paste PTA, no CP or other complaints on arrival.

## 2017-09-29 NOTE — ED Provider Notes (Signed)
[facility] EMERGENCY DEPARTMENT Provider Note   CSN: [id number] Arrival date & time: 09/29/17  1910     History   Chief Complaint Chief Complaint  Patient presents with  . Chest Pain    HPI William Mueller is a 67 y.o. male who presents with near syncope and chest pain. PMH significant for chronic chest pain, CKD, chronic low back pain, COPD (smokes e-cigs), CAD, CHF with pEF, HTN, HLD. He states that he was at home watching tv and got up to go to the kitchen when he "didn't feel right". He felt weak, lightheaded, and became diaphoretic. He checked his BP with a home monitor and it shows a pressure of 79/70 and HR of 140s. He states he has had this before over the past couple months and normally will lie down however he felt his symptoms were too severe to lie down so he called EMS. When they arrived BP was in the 150s and HR was in the 140s. He developed some dull chest pain which lasted a couple minutes. It resolved with ASA and nitro. He reports associated mild SOB and a headache as well. He did not pass out. No recent surgery/travel/immobilization, hx of cancer, leg swelling, hemptysis, prior DVT/PE, or hormone use.  Cardiologist - Dr. Rockey Situ  Cath in 2017 revealed widely patent mid RCA stent. Stable proximal moderate RCA stenosis not significant by pressure wire interrogation. Significant disease affecting a small first diagonal and the AV groove branch of the left circumflex. Both of these are unchanged from before. There is mild to moderate calcified proximal to mid LAD disease which is also unchanged. 2. Mild to moderate elevation of left ventricular end-diastolic pressure.   HPI  Past Medical History:  Diagnosis Date  . Anginal pain ([place]) 06/2017  . Anxiety   . Bronchitis 06/2017  . Chronic chest pain   . Chronic kidney disease (CKD) stage G3b/A1, moderately decreased glomerular filtration rate (GFR) between 30-44 mL/min/1.73 square meter and albuminuria  creatinine ratio less than 30 mg/g (HCC)   . Chronic lower back pain    WITH LEG WEAKNESS  . COPD (chronic obstructive pulmonary disease) (HCC)    EMPHYSEMA, O2 AT 2L PRN  . Coronary artery disease    a. 12/2013 PCI: mRCA 100% with L to R collats s/p PCI/DES;  b. 06/2014 MV: no ischemia/infarct, EF 53%;  c. 03/2015 Cath: LM nl, LAD nl, D1 80 (1.34mm), LCX 12m (<1.27mm), OM1 nl, RCA 55p, patent stent, RPDA nl, EF 65%; d. 09/2015 Cath: LM nl, LAD 74m, D1 80 (<32mm), LCX 59m/d (<1.73mm), OM1 nl, RCA 55p (FFR 0.93), patent stent, RPDA nl-->Med Rx.  . Daily headache   . Depression   . Diastolic dysfunction    a. echo 12/2013: EF 55-60%, mild LVH, GR1DD, inf HK, elevated CVP, mildly dilated IVC suggestive of increased RA pressure  . Dyspnea    WHEEZING  . GERD (gastroesophageal reflux disease)    REFLUX  . Hearing loss   . High cholesterol   . Hypertension   . Myocardial infarction ([place]) 2015  . Nicotine addiction    a. using eCigs.  . Orthopnea   . Sleep apnea     Patient Active Problem List   Diagnosis Date Noted  . Nicotine addiction   . CAD S/P PCI DES to RCA 03/11/2015  . Diastolic dysfunction   . Chronic chest pain   . Chest pain at rest 11/23/2014  . GERD (gastroesophageal reflux disease) 06/08/2014  .  CKD (chronic kidney disease) stage 3, GFR 30-59 ml/min (HCC) 05/04/2014  . Bilateral leg pain 05/04/2014  . Lung nodule < 6cm on CT 05/04/2014  . Abnormal CT scan, kidney 05/04/2014  . Unstable angina ([place]) 05/04/2014  . Atypical chest pain 03/22/2014  . Atrial tachycardia, paroxysmal ([place]) 12/14/2013  . Atherosclerosis of native coronary artery with unstable angina pectoris ([place]) 12/13/2013  . NSTEMI (non-ST elevated myocardial infarction) ([place]) 12/13/2013  . Labile essential hypertension 12/11/2013  . Acute renal failure superimposed on stage 3 chronic kidney disease ([place]) 12/11/2013  . Hyperlipidemia 12/11/2013  . COPD (chronic obstructive pulmonary disease) ([place]) 12/11/2013     Past Surgical History:  Procedure Laterality Date  . CARDIAC CATHETERIZATION     MC x 1 stent  . CARDIAC CATHETERIZATION Left 03/12/2015   Procedure: Left Heart Cath and Coronary Angiography;  Surgeon: Leonie Man, MD;  Location: [facility];  Service: Cardiovascular;  Laterality: Left;  . CARDIAC CATHETERIZATION N/A 09/29/2015   Procedure: Left Heart Cath and Coronary Angiography;  Surgeon: Wellington Hampshire, MD;  Location: [facility];  Service: Cardiovascular;  Laterality: N/A;  . CATARACT EXTRACTION W/PHACO Right 08/10/2017   Procedure: CATARACT EXTRACTION PHACO AND INTRAOCULAR LENS PLACEMENT ([place]) RIGHT;  Surgeon: Leandrew Koyanagi, MD;  Location: [facility];  Service: Ophthalmology;  Laterality: Right;  . CORONARY ANGIOPLASTY     STENT PLACEMENT  . LEFT HEART CATHETERIZATION WITH CORONARY ANGIOGRAM N/A 12/12/2013   Procedure: LEFT HEART CATHETERIZATION WITH CORONARY ANGIOGRAM;  Surgeon: Wellington Hampshire, MD;  Location: [facility];  Service: Cardiovascular;  Laterality: N/A;        Home Medications    Prior to Admission medications   Medication Sig Start Date End Date Taking? Authorizing Provider  acetaminophen (TYLENOL) 500 MG tablet Take 1,000 mg by mouth daily as needed for headache.     [provider]  albuterol (PROVENTIL HFA;VENTOLIN HFA) 108 (90 Base) MCG/ACT inhaler Inhale into the lungs every 6 (six) hours as needed for wheezing or shortness of breath.    [provider]  albuterol (PROVENTIL) (2.5 MG/3ML) 0.083% nebulizer solution Take 3 mLs (2.5 mg total) by nebulization every 6 (six) hours as needed for wheezing or shortness of breath. 06/12/17   Johnn Hai, PA-C  ALPRAZolam (XANAX XR) 3 MG 24 hr tablet Take 3 mg by mouth every evening.     [provider]  amLODipine (NORVASC) 5 MG tablet Take 1 tablet (5 mg total) by mouth daily. Patient taking differently: Take 5 mg by mouth daily. LUNCH 01/04/17    Minna Merritts, MD  aspirin 81 MG EC tablet Take 1 tablet (81 mg total) by mouth daily. Patient taking differently: Take 81 mg by mouth daily. BREAKFAST 09/04/15   Minna Merritts, MD  atorvastatin (LIPITOR) 80 MG tablet Take 1 tablet (80 mg total) by mouth daily. Patient taking differently: Take 80 mg by mouth daily. DINNER 08/18/16   Minna Merritts, MD  budesonide-formoterol (SYMBICORT) 160-4.5 MCG/ACT inhaler Inhale 2 puffs into the lungs 2 (two) times daily. Patient taking differently: Inhale 2 puffs into the lungs 2 (two) times daily. BREAKFAST AND BEDTIME 09/04/15   Laverle Hobby, MD  cyclobenzaprine (FLEXERIL) 10 MG tablet Take 10 mg by mouth 3 (three) times daily as needed for muscle spasms.    [provider]  ezetimibe (ZETIA) 10 MG tablet Take 1 tablet (10 mg total) by mouth daily. Patient taking differently: Take 10 mg by  mouth daily. DINNER 07/01/17   Minna Merritts, MD  furosemide (LASIX) 20 MG tablet Take 20 mg by mouth daily. 1qam    [provider]  hydrALAZINE (APRESOLINE) 25 MG tablet Take 1 tablet (25 mg total) by mouth 3 (three) times daily as needed. 07/19/16   Minna Merritts, MD  metoprolol succinate (TOPROL-XL) 50 MG 24 hr tablet TAKE ONE TABLET EVERY DAY WITH A MEAL Patient taking differently: TAKE ONE TABLET EVERY DAY WITH A MEAL/ AM 05/24/17   Gollan, Kathlene November, MD  NITROSTAT 0.4 MG SL tablet PLACE 1 TABLET UNDER TONGUE EVERY 5 MINUTES AS NEEDED FOR CHEST PAIN.CALL 911 AFTER 3RD TABLET. 11/14/15   Minna Merritts, MD  omeprazole (PRILOSEC) 40 MG capsule Take 1 capsule (40 mg total) by mouth daily. Patient taking differently: Take 40 mg by mouth daily. AM 06/28/16   Minna Merritts, MD  OXYGEN Inhale 2 L into the lungs as needed.    [provider]  roflumilast (DALIRESP) 500 MCG TABS tablet Take 1 tablet (500 mcg total) by mouth daily. Patient taking differently: Take 500 mcg by mouth daily. LUNCH 09/04/15   Laverle Hobby, MD  tamsulosin (FLOMAX) 0.4 MG CAPS capsule Take 1 capsule (0.4 mg total) by mouth daily. 09/28/17   Hollice Espy, MD  tiotropium (SPIRIVA) 18 MCG inhalation capsule Place 1 capsule (18 mcg total) into inhaler and inhale daily. Patient taking differently: Place 18 mcg into inhaler and inhale daily. AM 09/04/15   Laverle Hobby, MD  VENTOLIN HFA 108 (90 Base) MCG/ACT inhaler INHALE 1 TO 2 PUFFS INTO THE LUNGS EVERY 6 HOURS AS NEEDED FOR WHEEZING OR SHORTNESS OF BREATH. 03/25/16   Laverle Hobby, MD    Family History Family History  Problem Relation Age of Onset  . Coronary artery disease Brother   . Heart disease Brother 46  . Heart disease Father        MI x 8  . Kidney disease Neg Hx   . Prostate cancer Neg Hx     Social History Social History   Tobacco Use  . Smoking status: Current Every Day Smoker    Years: 48.00    Types: E-cigarettes    Last attempt to quit: 06/07/2009    Years since quitting: 8.3  . Smokeless tobacco: Never Used  . Tobacco comment: Using electronic cigarette  Substance Use Topics  . Alcohol use: Yes    Alcohol/week: 3.6 oz    Types: 6 Cans of beer per week    Comment: Occasional   . Drug use: No     Allergies   Benzodiazepines; Paroxetine hcl; Serotonin reuptake inhibitors (ssris); Diazepam; Doxycycline monohydrate; Escitalopram oxalate; Isosorbide nitrate; Lorazepam; and Tetracyclines & related   Review of Systems Review of Systems  Constitutional: Positive for diaphoresis. Negative for fever.  Respiratory: Positive for shortness of breath.   Cardiovascular: Positive for chest pain. Negative for leg swelling.  Gastrointestinal: Positive for constipation. Negative for abdominal pain, nausea and vomiting.  Neurological: Positive for light-headedness and headaches. Negative for syncope.  Psychiatric/Behavioral: The patient is nervous/anxious.   All other systems reviewed and are negative.    Physical Exam Updated Vital  Signs BP 122/66 (BP Location: Right Arm)   Pulse (!) 123   Temp 98.5 F (36.9 C) (Oral)   Resp 14   Ht 5\' 9"  (1.753 m)   Wt 91.6 kg (202 lb)   SpO2 100%   BMI 29.83 kg/m   Physical Exam  Constitutional:  He is oriented to person, place, and time. He appears well-developed and well-nourished. No distress.  Anxious. Mildly tremulous and distressed with sitting up for lung exam  HENT:  Head: Normocephalic and atraumatic.  Eyes: Pupils are equal, round, and reactive to light. Conjunctivae are normal. Right eye exhibits no discharge. Left eye exhibits no discharge. No scleral icterus.  Neck: Normal range of motion.  Cardiovascular: Regular rhythm. Tachycardia present. Exam reveals no gallop and no friction rub.  No murmur heard. Pulmonary/Chest: Effort normal. No respiratory distress. He has wheezes.  Abdominal: Soft. Bowel sounds are normal. He exhibits no distension. There is no tenderness.  Musculoskeletal:  No peripheral edema  Neurological: He is alert and oriented to person, place, and time.  Skin: Skin is warm and dry.  Psychiatric: He has a normal mood and affect. His behavior is normal.  Nursing note and vitals reviewed.    ED Treatments / Results  Labs (all labs ordered are listed, but only abnormal results are displayed) Labs Reviewed  BASIC METABOLIC PANEL - Abnormal; Notable for the following components:      Result Value   Potassium 3.1 (*)    Glucose, Bld 118 (*)    Creatinine, Ser 1.68 (*)    Calcium 8.8 (*)    GFR calc non Af Amer 40 (*)    GFR calc Af Amer 47 (*)    All other components within normal limits  CBC  I-STAT TROPONIN, ED    EKG None  Radiology Dg Chest 2 View  Result Date: 09/29/2017 CLINICAL DATA:  67 y/o  M; chest pain. EXAM: CHEST - 2 VIEW COMPARISON:  06/12/2017 chest radiograph FINDINGS: Stable heart size and mediastinal contours are within normal limits. Both lungs are clear. The visualized skeletal structures are unremarkable.  IMPRESSION: No acute pulmonary process identified. Electronically Signed   By: Kristine Garbe M.D.   On: 09/29/2017 20:22   Ct Angio Chest Pe W/cm &/or Wo Cm  Result Date: 09/29/2017 CLINICAL DATA:  Shortness of breath.  Chest pain. EXAM: CT ANGIOGRAPHY CHEST WITH CONTRAST TECHNIQUE: Multidetector CT imaging of the chest was performed using the standard protocol during bolus administration of intravenous contrast. Multiplanar CT image reconstructions and MIPs were obtained to evaluate the vascular anatomy. CONTRAST:  127mL ISOVUE-370 IOPAMIDOL (ISOVUE-370) INJECTION 76% COMPARISON:  Radiographs of same day.  CT scan of January 12, 2013. FINDINGS: Cardiovascular: Satisfactory opacification of the pulmonary arteries to the segmental level. No evidence of pulmonary embolism. Normal heart size. No pericardial effusion. Atherosclerosis of thoracic aorta is noted without aneurysm or dissection. Coronary artery calcifications are noted. Mediastinum/Nodes: No enlarged mediastinal, hilar, or axillary lymph nodes. Thyroid gland, trachea, and esophagus demonstrate no significant findings. Lungs/Pleura: Lungs are clear. No pleural effusion or pneumothorax. Upper Abdomen: No acute abnormality. Musculoskeletal: No chest wall abnormality. No acute or significant osseous findings. Review of the MIP images confirms the above findings. IMPRESSION: No definite evidence of pulmonary embolus. Coronary artery calcifications are noted suggesting coronary artery disease. Aortic Atherosclerosis (ICD10-I70.0). Electronically Signed   By: Marijo Conception, M.D.   On: 09/29/2017 20:51    Procedures Procedures (including critical care time)  Medications Ordered in ED Medications  lactated ringers bolus 1,000 mL (1,000 mLs Intravenous New Bag/Given 09/29/17 2244)  potassium chloride 10 mEq in 100 mL IVPB (has no administration in time range)  sodium chloride 0.9 % bolus 1,000 mL (0 mLs Intravenous Stopped 09/29/17 2131)   iopamidol (ISOVUE-370) 76 % injection 100 mL (100  mLs Intravenous Contrast Given 09/29/17 2039)  ALPRAZolam Duanne Moron) tablet 3 mg (3 mg Oral Given 09/29/17 2241)     Initial Impression / Assessment and Plan / ED Course  I have reviewed the triage vital signs and the nursing notes.  Pertinent labs & imaging results that were available during my care of the patient were reviewed by me and considered in my medical decision making (see chart for details).  67 year old male presents with near syncope and chest pain. He is tachycardic on arrival. Otherwise vitals are normal. Heart is regular rate and rhythm. Lungs have mild expiratory wheezes. He is very lightheaded with just sitting up in the stretcher. Orthostatics were obtained which were abnormal.  Orthostatic VS for the past 24 hrs:  BP- Lying Pulse- Lying BP- Sitting Pulse- Sitting BP- Standing at 0 minutes Pulse- Standing at 0 minutes  09/29/17 2052 125/70 104 110/66 113 (!) 89/61 122   EKG is sinus tachycardia. CTA is negative for PE. CBC is normal. BMP is remarkable for hypokalemia (3.1), elevated SCr (1.68) which is his baseline. Troponin is 0. Shared visit with Dr. Dayna Barker. The patient has not had any more chest pain but is now having jaw pain. Will admit for dehydration and chest pain r/o. Spoke with Dr. Myna Hidalgo who will admit.   Final Clinical Impressions(s) / ED Diagnoses   Final diagnoses:  Orthostatic hypotension  Nonspecific chest pain    ED Discharge Orders    None       Recardo Evangelist, PA-C 09/30/17 1761    Merrily Pew, MD 10/03/17 2040

## 2017-09-29 NOTE — ED Notes (Signed)
ED Provider at bedside. 

## 2017-09-30 ENCOUNTER — Observation Stay (HOSPITAL_BASED_OUTPATIENT_CLINIC_OR_DEPARTMENT_OTHER): Payer: PPO

## 2017-09-30 DIAGNOSIS — I951 Orthostatic hypotension: Secondary | ICD-10-CM

## 2017-09-30 DIAGNOSIS — J432 Centrilobular emphysema: Secondary | ICD-10-CM | POA: Diagnosis not present

## 2017-09-30 DIAGNOSIS — R55 Syncope and collapse: Secondary | ICD-10-CM

## 2017-09-30 DIAGNOSIS — I2510 Atherosclerotic heart disease of native coronary artery without angina pectoris: Secondary | ICD-10-CM | POA: Diagnosis not present

## 2017-09-30 DIAGNOSIS — Z9861 Coronary angioplasty status: Secondary | ICD-10-CM

## 2017-09-30 DIAGNOSIS — R079 Chest pain, unspecified: Secondary | ICD-10-CM | POA: Diagnosis present

## 2017-09-30 DIAGNOSIS — N183 Chronic kidney disease, stage 3 (moderate): Secondary | ICD-10-CM | POA: Diagnosis not present

## 2017-09-30 DIAGNOSIS — E876 Hypokalemia: Secondary | ICD-10-CM | POA: Diagnosis not present

## 2017-09-30 LAB — BASIC METABOLIC PANEL
Anion gap: 9 (ref 5–15)
BUN: 11 mg/dL (ref 6–20)
CALCIUM: 8.2 mg/dL — AB (ref 8.9–10.3)
CO2: 24 mmol/L (ref 22–32)
CREATININE: 1.52 mg/dL — AB (ref 0.61–1.24)
Chloride: 105 mmol/L (ref 101–111)
GFR calc non Af Amer: 45 mL/min — ABNORMAL LOW (ref >60)
GFR, EST AFRICAN AMERICAN: 53 mL/min — AB (ref >60)
Glucose, Bld: 91 mg/dL (ref 65–99)
Potassium: 3.5 mmol/L (ref 3.5–5.1)
SODIUM: 138 mmol/L (ref 135–145)

## 2017-09-30 LAB — TROPONIN I: Troponin I: <0.03 ng/mL (ref <0.03)

## 2017-09-30 LAB — ECHOCARDIOGRAM COMPLETE
HEIGHTINCHES: 69 in
WEIGHTICAEL: 3232 [oz_av]

## 2017-09-30 LAB — MAGNESIUM: Magnesium: 2.2 mg/dL (ref 1.7–2.4)

## 2017-09-30 LAB — TSH: TSH: 0.885 u[IU]/mL (ref 0.350–4.500)

## 2017-09-30 LAB — CBG MONITORING, ED: Glucose-Capillary: 83 mg/dL (ref 65–99)

## 2017-09-30 MED ORDER — NITROGLYCERIN 0.4 MG SL SUBL: 0.4000 mg | SUBLINGUAL_TABLET | SUBLINGUAL | Status: DC | PRN

## 2017-09-30 MED ORDER — HYDROCODONE-ACETAMINOPHEN 5-325 MG PO TABS: 1.0000 | ORAL_TABLET | ORAL | Status: DC | PRN

## 2017-09-30 MED ORDER — SENNOSIDES-DOCUSATE SODIUM 8.6-50 MG PO TABS: 1.0000 | ORAL_TABLET | Freq: Every evening | ORAL | Status: DC | PRN

## 2017-09-30 MED ORDER — ALBUTEROL SULFATE (2.5 MG/3ML) 0.083% IN NEBU: 2.5000 mg | INHALATION_SOLUTION | Freq: Four times a day (QID) | RESPIRATORY_TRACT | Status: DC | PRN

## 2017-09-30 MED ORDER — ONDANSETRON HCL 4 MG/2ML IJ SOLN
4.0000 mg | Freq: Four times a day (QID) | INTRAMUSCULAR | Status: DC | PRN
Start: 2017-09-30 — End: 2017-10-01

## 2017-09-30 MED ORDER — ATORVASTATIN CALCIUM 80 MG PO TABS
80.0000 mg | ORAL_TABLET | Freq: Every day | ORAL | Status: DC
  Administered 2017-09-30: 80 mg via ORAL
  Filled 2017-09-30 (×2): qty 1

## 2017-09-30 MED ORDER — SODIUM CHLORIDE 0.9% FLUSH
3.0000 mL | Freq: Two times a day (BID) | INTRAVENOUS | Status: DC
  Administered 2017-09-30 – 2017-10-01 (×3): 3 mL via INTRAVENOUS

## 2017-09-30 MED ORDER — POTASSIUM CHLORIDE IN NACL 20-0.9 MEQ/L-% IV SOLN
INTRAVENOUS | Status: AC
  Administered 2017-09-30: 04:00:00 via INTRAVENOUS
  Filled 2017-09-30: qty 1000

## 2017-09-30 MED ORDER — ALPRAZOLAM 0.5 MG PO TABS
1.0000 mg | ORAL_TABLET | Freq: Three times a day (TID) | ORAL | Status: DC
  Administered 2017-09-30 – 2017-10-01 (×6): 1 mg via ORAL
  Filled 2017-09-30 (×6): qty 2

## 2017-09-30 MED ORDER — CYCLOBENZAPRINE HCL 10 MG PO TABS: 10.0000 mg | ORAL_TABLET | Freq: Three times a day (TID) | ORAL | Status: DC | PRN

## 2017-09-30 MED ORDER — MORPHINE SULFATE (PF) 4 MG/ML IV SOLN
4.0000 mg | INTRAVENOUS | Status: DC | PRN
  Administered 2017-09-30: 4 mg via INTRAVENOUS
  Filled 2017-09-30: qty 1

## 2017-09-30 MED ORDER — ACETAMINOPHEN 650 MG RE SUPP: 650.0000 mg | Freq: Four times a day (QID) | RECTAL | Status: DC | PRN

## 2017-09-30 MED ORDER — ROFLUMILAST 500 MCG PO TABS
500.0000 ug | ORAL_TABLET | Freq: Every day | ORAL | Status: DC
  Administered 2017-09-30 – 2017-10-01 (×2): 500 ug via ORAL
  Filled 2017-09-30 (×2): qty 1

## 2017-09-30 MED ORDER — EZETIMIBE 10 MG PO TABS
10.0000 mg | ORAL_TABLET | Freq: Every day | ORAL | Status: DC
  Administered 2017-09-30 – 2017-10-01 (×2): 10 mg via ORAL
  Filled 2017-09-30 (×2): qty 1

## 2017-09-30 MED ORDER — MOMETASONE FURO-FORMOTEROL FUM 200-5 MCG/ACT IN AERO
2.0000 | INHALATION_SPRAY | Freq: Two times a day (BID) | RESPIRATORY_TRACT | Status: DC
  Administered 2017-09-30 – 2017-10-01 (×2): 2 via RESPIRATORY_TRACT
  Filled 2017-09-30: qty 8.8

## 2017-09-30 MED ORDER — PANTOPRAZOLE SODIUM 40 MG PO TBEC
40.0000 mg | DELAYED_RELEASE_TABLET | Freq: Every day | ORAL | Status: DC
  Administered 2017-09-30 – 2017-10-01 (×2): 40 mg via ORAL
  Filled 2017-09-30 (×2): qty 1

## 2017-09-30 MED ORDER — ASPIRIN EC 81 MG PO TBEC
81.0000 mg | DELAYED_RELEASE_TABLET | Freq: Every day | ORAL | Status: DC
  Administered 2017-09-30 – 2017-10-01 (×2): 81 mg via ORAL
  Filled 2017-09-30 (×2): qty 1

## 2017-09-30 MED ORDER — TAMSULOSIN HCL 0.4 MG PO CAPS
0.4000 mg | ORAL_CAPSULE | Freq: Every day | ORAL | Status: DC
  Administered 2017-09-30 – 2017-10-01 (×2): 0.4 mg via ORAL
  Filled 2017-09-30 (×2): qty 1

## 2017-09-30 MED ORDER — MAGNESIUM SULFATE 2 GM/50ML IV SOLN
2.0000 g | Freq: Once | INTRAVENOUS | Status: AC
  Administered 2017-09-30: 2 g via INTRAVENOUS
  Filled 2017-09-30: qty 50

## 2017-09-30 MED ORDER — AMLODIPINE BESYLATE 5 MG PO TABS: 5.0000 mg | ORAL_TABLET | Freq: Every day | ORAL | Status: DC

## 2017-09-30 MED ORDER — ACETAMINOPHEN 325 MG PO TABS
650.0000 mg | ORAL_TABLET | Freq: Four times a day (QID) | ORAL | Status: DC | PRN
  Administered 2017-09-30: 650 mg via ORAL

## 2017-09-30 MED ORDER — TIOTROPIUM BROMIDE MONOHYDRATE 18 MCG IN CAPS
18.0000 ug | ORAL_CAPSULE | Freq: Every day | RESPIRATORY_TRACT | Status: DC
  Administered 2017-09-30 – 2017-10-01 (×2): 18 ug via RESPIRATORY_TRACT
  Filled 2017-09-30: qty 5

## 2017-09-30 MED ORDER — HEPARIN SODIUM (PORCINE) 5000 UNIT/ML IJ SOLN
5000.0000 [IU] | Freq: Three times a day (TID) | INTRAMUSCULAR | Status: DC
  Administered 2017-09-30 – 2017-10-01 (×3): 5000 [IU] via SUBCUTANEOUS
  Filled 2017-09-30 (×3): qty 1

## 2017-09-30 MED ORDER — POTASSIUM CHLORIDE CRYS ER 20 MEQ PO TBCR
20.0000 meq | EXTENDED_RELEASE_TABLET | Freq: Once | ORAL | Status: AC
  Administered 2017-09-30: 20 meq via ORAL
  Filled 2017-09-30: qty 1

## 2017-09-30 MED ORDER — ONDANSETRON HCL 4 MG PO TABS: 4.0000 mg | ORAL_TABLET | Freq: Four times a day (QID) | ORAL | Status: DC | PRN

## 2017-09-30 MED ORDER — METOPROLOL SUCCINATE ER 25 MG PO TB24
25.0000 mg | ORAL_TABLET | Freq: Every day | ORAL | Status: DC
  Administered 2017-09-30 – 2017-10-01 (×2): 25 mg via ORAL
  Filled 2017-09-30 (×2): qty 1

## 2017-09-30 NOTE — Progress Notes (Signed)
  Echocardiogram 2D Echocardiogram has been performed.  William Mueller M 09/30/2017, 1:48 PM

## 2017-09-30 NOTE — Consult Note (Addendum)
Cardiology Consultation:   Patient ID: William Mueller; [id number]; 05/03/1951   Admit date: 09/29/2017 Date of Consult: 09/30/2017  Primary Care Provider: Philmore Pali, NP Primary Cardiologist:Dr.Gollan  Patient Profile:   William Mueller is a 67 y.o. male with a hx of CAD s/p DES to RCA 2015, COPD, CKD stage III, atrial tachycardia, HTN, HLD and chronic diastolic CHF who is being seen today for the evaluation of chest pain at the request of Dr. Herbert Moors.   Last cardiac cath 09/2015 showed patent RCA stent. Stable disease to pLAD, small first diagonal and the AV groove branch of the left circumflex. Recommended medical therapy.   He was doing well on cardiac stand point when last seen by Dr. Rockey Situ 05/12/17.  History of Present Illness:   William Mueller was in [place] up until yesterday when he had sudden onset dizziness while standing up from sitting position. Associated with diaphoresis and blurred vision. BP 90/70 at HR of 140s. Then he had L sided sharp pain. Different than prior angina. EMS was called on placed on nitro patch. His pain resolved upon ER arrival. He was noted in tachycardiac and orthostatic in ER. Started on fluids. K 3.1-->3.5. Troponin negative. CTA negative for PE. However showed aortic atherosclerosis and coronary calcification.   Sedentary lifestyle. Prior smoker quit in 2011. Currently smokes e-cig. No orthopnea or PND.   Past Medical History:  Diagnosis Date  . Anginal pain ([place]) 06/2017  . Anxiety   . Bronchitis 06/2017  . Chronic chest pain   . Chronic kidney disease (CKD) stage G3b/A1, moderately decreased glomerular filtration rate (GFR) between 30-44 mL/min/1.73 square meter and albuminuria creatinine ratio less than 30 mg/g (HCC)   . Chronic lower back pain    WITH LEG WEAKNESS  . COPD (chronic obstructive pulmonary disease) (HCC)    EMPHYSEMA, O2 AT 2L PRN  . Coronary artery disease    a. 12/2013 PCI: mRCA 100% with L to R collats s/p PCI/DES;  b. 06/2014 MV: no  ischemia/infarct, EF 53%;  c. 03/2015 Cath: LM nl, LAD nl, D1 80 (1.70mm), LCX 82m (<1.56mm), OM1 nl, RCA 55p, patent stent, RPDA nl, EF 65%; d. 09/2015 Cath: LM nl, LAD 15m, D1 80 (<51mm), LCX 45m/d (<1.66mm), OM1 nl, RCA 55p (FFR 0.93), patent stent, RPDA nl-->Med Rx.  . Daily headache   . Depression   . Diastolic dysfunction    a. echo 12/2013: EF 55-60%, mild LVH, GR1DD, inf HK, elevated CVP, mildly dilated IVC suggestive of increased RA pressure  . Dyspnea    WHEEZING  . GERD (gastroesophageal reflux disease)    REFLUX  . Hearing loss   . High cholesterol   . Hypertension   . Myocardial infarction ([place]) 2015  . Nicotine addiction    a. using eCigs.  . Orthopnea   . Sleep apnea     Past Surgical History:  Procedure Laterality Date  . CARDIAC CATHETERIZATION     MC x 1 stent  . CARDIAC CATHETERIZATION Left 03/12/2015   Procedure: Left Heart Cath and Coronary Angiography;  Surgeon: Leonie Man, MD;  Location: [facility];  Service: Cardiovascular;  Laterality: Left;  . CARDIAC CATHETERIZATION N/A 09/29/2015   Procedure: Left Heart Cath and Coronary Angiography;  Surgeon: Wellington Hampshire, MD;  Location: [facility];  Service: Cardiovascular;  Laterality: N/A;  . CATARACT EXTRACTION W/PHACO Right 08/10/2017   Procedure: CATARACT EXTRACTION PHACO AND INTRAOCULAR LENS PLACEMENT ([place]) RIGHT;  Surgeon: Leandrew Koyanagi,  MD;  Location: [facility];  Service: Ophthalmology;  Laterality: Right;  . CORONARY ANGIOPLASTY     STENT PLACEMENT  . LEFT HEART CATHETERIZATION WITH CORONARY ANGIOGRAM N/A 12/12/2013   Procedure: LEFT HEART CATHETERIZATION WITH CORONARY ANGIOGRAM;  Surgeon: Wellington Hampshire, MD;  Location: [facility];  Service: Cardiovascular;  Laterality: N/A;       Inpatient Medications: Scheduled Meds: . ALPRAZolam  1 mg Oral TID AC & HS  . aspirin EC  81 mg Oral Daily  . atorvastatin  80 mg Oral q1800  . ezetimibe  10 mg Oral Daily  . heparin   5,000 Units Subcutaneous Q8H  . metoprolol succinate  25 mg Oral Daily  . mometasone-formoterol  2 puff Inhalation BID  . pantoprazole  40 mg Oral Daily  . roflumilast  500 mcg Oral Daily  . sodium chloride flush  3 mL Intravenous Q12H  . tamsulosin  0.4 mg Oral Daily  . tiotropium  18 mcg Inhalation Daily   Continuous Infusions:  PRN Meds: acetaminophen **OR** acetaminophen, albuterol, cyclobenzaprine, HYDROcodone-acetaminophen, morphine injection, nitroGLYCERIN, ondansetron **OR** ondansetron (ZOFRAN) IV, senna-docusate  Allergies:    Allergies  Allergen Reactions  . Paroxetine Hcl Shortness Of Breath and Palpitations  . Serotonin Reuptake Inhibitors ([place]) Shortness Of Breath and Palpitations  . Diazepam Other (See Comments)    Rapid heartrate  . Doxycycline Monohydrate Other (See Comments)    Unknown allergic reaction  . Escitalopram Oxalate Other (See Comments)    serotonin Syndrome  . Lorazepam Other (See Comments)    Unknown allergic reaction  . Tetracyclines & Related Itching and Rash    Social History:   Social History   Socioeconomic History  . Marital status: Married    Spouse name: Not on file  . Number of children: Not on file  . Years of education: Not on file  . Highest education level: Not on file  Occupational History  . Not on file  Social Needs  . Financial resource strain: Not on file  . Food insecurity:    Worry: Not on file    Inability: Not on file  . Transportation needs:    Medical: Not on file    Non-medical: Not on file  Tobacco Use  . Smoking status: Current Every Day Smoker    Years: 48.00    Types: E-cigarettes    Last attempt to quit: 06/07/2009    Years since quitting: 8.3  . Smokeless tobacco: Never Used  . Tobacco comment: Using electronic cigarette  Substance and Sexual Activity  . Alcohol use: Yes    Alcohol/week: 3.6 oz    Types: 6 Cans of beer per week    Comment: Occasional   . Drug use: No  . Sexual activity: Not  Currently  Lifestyle  . Physical activity:    Days per week: Not on file    Minutes per session: Not on file  . Stress: Not on file  Relationships  . Social connections:    Talks on phone: Not on file    Gets together: Not on file    Attends religious service: Not on file    Active member of club or organization: Not on file    Attends meetings of clubs or organizations: Not on file    Relationship status: Not on file  . Intimate partner violence:    Fear of current or ex partner: Not on file    Emotionally abused: Not on file    Physically abused:  Not on file    Forced sexual activity: Not on file  Other Topics Concern  . Not on file  Social History Narrative  . Not on file    Family History:  Family History  Problem Relation Age of Onset  . Coronary artery disease Brother   . Heart disease Brother 36  . Heart disease Father        MI x 8  . Kidney disease Neg Hx   . Prostate cancer Neg Hx      ROS:  Please see the history of present illness.  All other ROS reviewed and negative.     Physical Exam/Data:   Vitals:   09/30/17 1000 09/30/17 1113 09/30/17 1200 09/30/17 1300  BP: 134/83 135/78 131/81 127/75  Pulse: (!) 109 (!) 102 98 90  Resp:  15 16 16   Temp:      TempSrc:      SpO2: 99% 97% 96% 93%  Weight:      Height:        Intake/Output Summary (Last 24 hours) at 09/30/2017 1424 Last data filed at 09/30/2017 1345 Gross per 24 hour  Intake 3100 ml  Output 600 ml  Net 2500 ml   Filed Weights   09/29/17 1918  Weight: 202 lb (91.6 kg)   Body mass index is 29.83 kg/m.  General:  Well nourished, well developed, in no acute distress HEENT: normal Lymph: no adenopathy Neck: no JVD Endocrine:  No thryomegaly Vascular: No carotid bruits; FA pulses 2+ bilaterally without bruits  Cardiac:  normal S1, S2; RRR; no murmur Lungs:  clear to auscultation bilaterally, no wheezing, rhonchi or rales  Abd: soft, nontender, no hepatomegaly  Ext: no  edema Musculoskeletal:  No deformities, BUE and BLE strength normal and equal Skin: warm and dry  Neuro:  CNs 2-12 intact, no focal abnormalities noted Psych:  Normal affect   EKG:  The EKG 08/01/17 was personally reviewed and demonstrates:  Sinus tachycardia at rate of 127 bpm, non specific ST depression laterally. EKG today sinus rhythm at rate of 88 bpm, TWI in lateal leads, new changes Telemetry:  Telemetry was personally reviewed and demonstrates:  Sinus rhythm at rate of 90s  Relevant CV Studies: Left Heart Cath and Coronary Angiography  09/29/2015  Conclusion    Mid Cx to Dist Cx lesion, 90% stenosed.  1st Diag lesion, 80% stenosed.  Prox RCA lesion, 55% stenosed. The lesion was not previously treated.  Prox LAD to Mid LAD lesion, 40% stenosed.   1. Widely patent mid RCA stent. Stable proximal moderate RCA stenosis not significant by pressure wire interrogation. Significant disease affecting a small first diagonal and the AV groove branch of the left circumflex. Both of these are unchanged from before. There is mild to moderate calcified proximal to mid LAD disease which is also unchanged.  2. Mild to moderate elevation of left ventricular end-diastolic pressure.  Recommendations: Continue aggressive medical therapy. Can discharge home later today after 6 hours of hydration. Only 45 mL of contrast was used.     Laboratory Data:  Chemistry Recent Labs  Lab 09/29/17 1920 09/30/17 0458  NA 138 138  K 3.1* 3.5  CL 101 105  CO2 25 24  GLUCOSE 118* 91  BUN 12 11  CREATININE 1.68* 1.52*  CALCIUM 8.8* 8.2*  GFRNONAA 40* 45*  GFRAA 47* 53*  ANIONGAP 12 9    Hematology Recent Labs  Lab 09/29/17 1920  WBC 9.8  RBC 4.94  HGB 13.8  HCT 41.2  MCV 83.4  MCH 27.9  MCHC 33.5  RDW 13.0  PLT 299   Cardiac Enzymes Recent Labs  Lab 09/30/17 0123 09/30/17 0458  TROPONINI <0.03 <0.03    Recent Labs  Lab 09/29/17 1931  TROPIPOC 0.00    Radiology/Studies:   Dg Chest 2 View  Result Date: 09/29/2017 CLINICAL DATA:  67 y/o  M; chest pain. EXAM: CHEST - 2 VIEW COMPARISON:  06/12/2017 chest radiograph FINDINGS: Stable heart size and mediastinal contours are within normal limits. Both lungs are clear. The visualized skeletal structures are unremarkable. IMPRESSION: No acute pulmonary process identified. Electronically Signed   By: Kristine Garbe M.D.   On: 09/29/2017 20:22   Ct Angio Chest Pe W/cm &/or Wo Cm  Result Date: 09/29/2017 CLINICAL DATA:  Shortness of breath.  Chest pain. EXAM: CT ANGIOGRAPHY CHEST WITH CONTRAST TECHNIQUE: Multidetector CT imaging of the chest was performed using the standard protocol during bolus administration of intravenous contrast. Multiplanar CT image reconstructions and MIPs were obtained to evaluate the vascular anatomy. CONTRAST:  163mL ISOVUE-370 IOPAMIDOL (ISOVUE-370) INJECTION 76% COMPARISON:  Radiographs of same day.  CT scan of January 12, 2013. FINDINGS: Cardiovascular: Satisfactory opacification of the pulmonary arteries to the segmental level. No evidence of pulmonary embolism. Normal heart size. No pericardial effusion. Atherosclerosis of thoracic aorta is noted without aneurysm or dissection. Coronary artery calcifications are noted. Mediastinum/Nodes: No enlarged mediastinal, hilar, or axillary lymph nodes. Thyroid gland, trachea, and esophagus demonstrate no significant findings. Lungs/Pleura: Lungs are clear. No pleural effusion or pneumothorax. Upper Abdomen: No acute abnormality. Musculoskeletal: No chest wall abnormality. No acute or significant osseous findings. Review of the MIP images confirms the above findings. IMPRESSION: No definite evidence of pulmonary embolus. Coronary artery calcifications are noted suggesting coronary artery disease. Aortic Atherosclerosis (ICD10-I70.0). Electronically Signed   By: Marijo Conception, M.D.   On: 09/29/2017 20:51    Assessment and Plan:   1. Chest pain - Chest  pain different than prior angina when he had stenting. In setting of orthostatics. Troponin has been remained negative. EKG with new lateral changes, likely tachy mediated. No work up planned currently.  2. CAD s/p DES to RCA in 2015 -Last cardiac cath 09/2015 showed patent RCA stent. Stable disease to pLAD, small first diagonal and the AV groove branch of the left circumflex. Recommended medical therapy.   3. Orthostatic hypotension with hx of HTN - No prior hx of othostatics. Likely due to less po intake vs over diuresis. No syncope. BP now has been stabilized after IV fluids. Continue to hold Norvasc. Continue BB at current dose (half of home). He need to ambulates.  4. Hypokalemia - Resolved   5. Chronic diastolic CHF -Likely over diuresis. He was taking lasix 20mg  qd at home. Pending reading of echo this admission.   6. Atrial tachycardia - No arrhthymias noted. Up titrate BB as BP allows.   For questions or updates, please contact [place] Please consult [email or web address] for contact info under Cardiology/STEMI.   Mahalia Longest Byars, [place]  09/30/2017 2:24 PM    Patient seen and examined. Agree with assessment and plan. William Mueller is a 66 year old Caucasian male who has known CAD and underwent stenting to his mid RCA in 2015.  He has a history of obesity, COPD with a prior 45-year tobacco history having quit for several years, chronic kidney disease stage III, hypertension, hyperlipidemia, and chronic diastolic heart failure.  His last cardiac catheterization in April 2017 showed  a patent RCA stent with competent CAD involving his LAD, diagonal and AV groove branch of his circumflex for which medical therapy was recommended.  Yesterday, he experienced a sudden onset of dizziness when standing after getting up from a sitting position.  This was associated with low blood pressure, diaphoresis and transient visual blurring.  He became tachycardic with heart rates up to 140 bpm.   He denied any anginal type symptoms but experienced a sharp left-sided chest pain different from his prior ischemic chest pain.  He was treated with fluids upon EMS arrival.  Troponin is negative.  CTA was negative for PE although there is evidence for aortic atherosclerosis and coronary calcification.  Patient states his pulse typically runs elevated often in excess of 100.  He has had issues with chronic low back pain and has difficulty walking.  He is very sedentary and aerobically deconditioned.  He also has very poor sleep pattern.  He cannot sleep on his back.  He often wakes up after several hours of sleep.  His sleep is nonrestorative.  He admits to daytime sleepiness.  He has never been evaluated for sleep apnea.  Presently, blood pressure is 130/75.  Pulse is in the mid 90s.  O2 saturation is 93%.  He is anicteric.  Mallampati scale is a 3/4.  He has a very thick neck.  Lungs were clear without wheezing.  Rhythm was regular with a 1/6 systolic murmur.  He had moderate central adiposity.  There was no pedal splenomegaly.  Bowel sounds were positive.  Distal pulses were adequate.  He did not have edema.  He has had some issues with distal paresthesias.  Abs are notable for potassium of 3.5, magnesium 2.2.  Creatinine 1.52 consistent with stage III chronic kidney disease.  He is not anemic.  ECG shows sinus rhythm at 88 without ectopy.  There are T wave changes in V4 through V6 have been consistently present on old ECGs during this admission.  ECG in December 2018 showed nonspecific T wave abnormalities.  However the mild T wave inversion in V4 through V6 is new.  He denies any exertional chest tightness but he is not very active.  At present, will change serial troponin levels and ECG.  A 2D echo Doppler study was just completed, results are still pending.  We will reinitiate beta-blocker therapy but at present avoid diuretic and amlodipine.  Thyroid studies will need to be assessed.  I have a high index of  suspicion for obstructive sleep apnea and ultimately this will need to be evaluated.  Weight loss is essential.  Doubt an ischemic etiology to his presyncope.  Follow-up ECG will follow   Troy Sine, MD, [facility] 09/30/2017 3:24 PM

## 2017-09-30 NOTE — ED Notes (Signed)
EKG was repeated, Because patient was complaining of Chest Pain.

## 2017-09-30 NOTE — H&P (Signed)
History and Physical    William Mueller HQP:[id number] DOB: [birth year] DOA: 09/29/2017  PCP: Philmore Pali, NP   Patient coming from: Home  Chief Complaint: Lightheaded on standing, chest pain   HPI: William Mueller is a 67 y.o. male with medical history significant for coronary artery disease, chronic chest pain, anxiety, COPD, and chronic kidney disease stage III, now presenting to the emergency department for evaluation of acute lightheadedness, diaphoresis, and chest discomfort.  Patient reports that he was in his usual state of health when he stood up from his couch and developed acute onset of lightheadedness with diaphoresis, feeling as though he would pass out.  He did not lose consciousness or fall.  EMS was called and the patient reported "2/10" chest pain.  He was treated with 324 mg of aspirin and nitroglycerin.  He is not sure if the nitroglycerin helped, but reports that pain had resolved by the time of his arrival in the ED.  Denies shortness of breath or cough, denies fevers, and denies chills.  ED Course: Upon arrival to the ED, patient is found to be afebrile, saturating well on room air, tachycardic in the 120s, and stable blood pressure.  EKG features sinus tachycardia with rate 127 and lateral ST depression.  Chest x-ray is negative for acute cardiopulmonary disease.  CTA chest is negative for PE.  Chemistry panel is notable for a creatinine of 1.68, similar to priors, and potassium 3.1.  CBC is unremarkable and troponin is undetectable.  Patient was given 2 L of IV fluids and 10 mEq IV potassium in the ED.  He remains hemodynamically stable and has not had recurrence in his chest pain.  He will be observed on the telemetry unit for ongoing evaluation and management of near syncope followed by transient chest discomfort.  Review of Systems:  All other systems reviewed and apart from HPI, are negative.  Past Medical History:  Diagnosis Date  . Anginal pain ([place]) 06/2017  . Anxiety    . Bronchitis 06/2017  . Chronic chest pain   . Chronic kidney disease (CKD) stage G3b/A1, moderately decreased glomerular filtration rate (GFR) between 30-44 mL/min/1.73 square meter and albuminuria creatinine ratio less than 30 mg/g (HCC)   . Chronic lower back pain    WITH LEG WEAKNESS  . COPD (chronic obstructive pulmonary disease) (HCC)    EMPHYSEMA, O2 AT 2L PRN  . Coronary artery disease    a. 12/2013 PCI: mRCA 100% with L to R collats s/p PCI/DES;  b. 06/2014 MV: no ischemia/infarct, EF 53%;  c. 03/2015 Cath: LM nl, LAD nl, D1 80 (1.74mm), LCX 73m (<1.71mm), OM1 nl, RCA 55p, patent stent, RPDA nl, EF 65%; d. 09/2015 Cath: LM nl, LAD 44m, D1 80 (<55mm), LCX 46m/d (<1.46mm), OM1 nl, RCA 55p (FFR 0.93), patent stent, RPDA nl-->Med Rx.  . Daily headache   . Depression   . Diastolic dysfunction    a. echo 12/2013: EF 55-60%, mild LVH, GR1DD, inf HK, elevated CVP, mildly dilated IVC suggestive of increased RA pressure  . Dyspnea    WHEEZING  . GERD (gastroesophageal reflux disease)    REFLUX  . Hearing loss   . High cholesterol   . Hypertension   . Myocardial infarction ([place]) 2015  . Nicotine addiction    a. using eCigs.  . Orthopnea   . Sleep apnea     Past Surgical History:  Procedure Laterality Date  . CARDIAC CATHETERIZATION     MC x 1  stent  . CARDIAC CATHETERIZATION Left 03/12/2015   Procedure: Left Heart Cath and Coronary Angiography;  Surgeon: Leonie Man, MD;  Location: [place] [facility];  Service: Cardiovascular;  Laterality: Left;  . CARDIAC CATHETERIZATION N/A 09/29/2015   Procedure: Left Heart Cath and Coronary Angiography;  Surgeon: Wellington Hampshire, MD;  Location: [place] [facility];  Service: Cardiovascular;  Laterality: N/A;  . CATARACT EXTRACTION W/PHACO Right 08/10/2017   Procedure: CATARACT EXTRACTION PHACO AND INTRAOCULAR LENS PLACEMENT ([place]) RIGHT;  Surgeon: Leandrew Koyanagi, MD;  Location: [facility];  Service: Ophthalmology;  Laterality: Right;   . CORONARY ANGIOPLASTY     STENT PLACEMENT  . LEFT HEART CATHETERIZATION WITH CORONARY ANGIOGRAM N/A 12/12/2013   Procedure: LEFT HEART CATHETERIZATION WITH CORONARY ANGIOGRAM;  Surgeon: Wellington Hampshire, MD;  Location: [place] [facility];  Service: Cardiovascular;  Laterality: N/A;     reports that he has been smoking e-cigarettes.  He has smoked for the past 48.00 years. He has never used smokeless tobacco. He reports that he drinks about 3.6 oz of alcohol per week. He reports that he does not use drugs.  Allergies  Allergen Reactions  . Benzodiazepines Shortness Of Breath    Patient is on Xanax  . Paroxetine Hcl Shortness Of Breath and Palpitations  . Serotonin Reuptake Inhibitors (Ssris) Shortness Of Breath and Palpitations  . Diazepam Other (See Comments)    Rapid heartrate  . Doxycycline Monohydrate Other (See Comments)    Unknown allergic reaction  . Escitalopram Oxalate Other (See Comments)    serotonin Syndrome  . Isosorbide Nitrate Other (See Comments)    Feels like head was going to explode -- HA  . Lorazepam Other (See Comments)    Unknown allergic reaction  . Tetracyclines & Related Itching and Rash    Family History  Problem Relation Age of Onset  . Coronary artery disease Brother   . Heart disease Brother 44  . Heart disease Father        MI x 8  . Kidney disease Neg Hx   . Prostate cancer Neg Hx      Prior to Admission medications   Medication Sig Start Date End Date Taking? Authorizing Provider  acetaminophen (TYLENOL) 500 MG tablet Take 1,000 mg by mouth daily as needed for headache.    Yes [provider]  albuterol (PROVENTIL HFA;VENTOLIN HFA) 108 (90 Base) MCG/ACT inhaler Inhale into the lungs every 6 (six) hours as needed for wheezing or shortness of breath.   Yes [provider]  albuterol (PROVENTIL) (2.5 MG/3ML) 0.083% nebulizer solution Take 3 mLs (2.5 mg total) by nebulization every 6 (six) hours as needed for wheezing or shortness of  breath. 06/12/17  Yes Johnn Hai, PA-C  ALPRAZolam (XANAX XR) 3 MG 24 hr tablet Take 1-3 mg by mouth 2 (two) times daily. 1 MG IN THE MORNING AND 3 MG IN THE EVENING   Yes [provider]  amLODipine (NORVASC) 5 MG tablet Take 1 tablet (5 mg total) by mouth daily. Patient taking differently: Take 5 mg by mouth daily. LUNCH 01/04/17  Yes Minna Merritts, MD  aspirin 81 MG EC tablet Take 1 tablet (81 mg total) by mouth daily. Patient taking differently: Take 81 mg by mouth daily. BREAKFAST 09/04/15  Yes Gollan, Kathlene November, MD  atorvastatin (LIPITOR) 80 MG tablet Take 1 tablet (80 mg total) by mouth daily. Patient taking differently: Take 80 mg by mouth daily. DINNER 08/18/16  Yes Ida Rogue  J, MD  budesonide-formoterol (SYMBICORT) 160-4.5 MCG/ACT inhaler Inhale 2 puffs into the lungs 2 (two) times daily. Patient taking differently: Inhale 2 puffs into the lungs 2 (two) times daily. BREAKFAST AND BEDTIME 09/04/15  Yes Laverle Hobby, MD  cyclobenzaprine (FLEXERIL) 10 MG tablet Take 10 mg by mouth 3 (three) times daily as needed for muscle spasms.   Yes [provider]  ezetimibe (ZETIA) 10 MG tablet Take 1 tablet (10 mg total) by mouth daily. Patient taking differently: Take 10 mg by mouth daily. DINNER 07/01/17  Yes Gollan, Kathlene November, MD  furosemide (LASIX) 20 MG tablet Take 20 mg by mouth daily.    Yes [provider]  metoprolol succinate (TOPROL-XL) 50 MG 24 hr tablet TAKE ONE TABLET EVERY DAY WITH A MEAL Patient taking differently: TAKE ONE HALF TABLET EVERY DAY WITH A MEAL/ AM 05/24/17  Yes Gollan, Kathlene November, MD  NITROSTAT 0.4 MG SL tablet PLACE 1 TABLET UNDER TONGUE EVERY 5 MINUTES AS NEEDED FOR CHEST PAIN.CALL 911 AFTER 3RD TABLET. 11/14/15  Yes Gollan, Kathlene November, MD  omeprazole (PRILOSEC) 40 MG capsule Take 1 capsule (40 mg total) by mouth daily. Patient taking differently: Take 40 mg by mouth daily. AM 06/28/16  Yes Minna Merritts, MD  OXYGEN Inhale 2  L into the lungs daily as needed (oxygen).    Yes [provider]  roflumilast (DALIRESP) 500 MCG TABS tablet Take 1 tablet (500 mcg total) by mouth daily. Patient taking differently: Take 500 mcg by mouth daily. LUNCH 09/04/15  Yes Laverle Hobby, MD  tamsulosin (FLOMAX) 0.4 MG CAPS capsule Take 1 capsule (0.4 mg total) by mouth daily. 09/28/17  Yes Hollice Espy, MD  tiotropium (SPIRIVA) 18 MCG inhalation capsule Place 1 capsule (18 mcg total) into inhaler and inhale daily. Patient taking differently: Place 18 mcg into inhaler and inhale daily. AM 09/04/15  Yes Laverle Hobby, MD  VENTOLIN HFA 108 (90 Base) MCG/ACT inhaler INHALE 1 TO 2 PUFFS INTO THE LUNGS EVERY 6 HOURS AS NEEDED FOR WHEEZING OR SHORTNESS OF BREATH. 03/25/16  Yes Laverle Hobby, MD    Physical Exam: Vitals:   09/29/17 2230 09/29/17 2300 09/29/17 2330 09/30/17 0000  BP: 132/78 122/72 118/67 103/69  Pulse: 95 89 82 91  Resp: 18 14 13 14   Temp:      TempSrc:      SpO2: 96% 97% 97% 97%  Weight:      Height:          Constitutional: NAD, calm  Eyes: PERTLA, lids and conjunctivae normal ENMT: Mucous membranes are moist. Posterior pharynx clear of any exudate or lesions.   Neck: normal, supple, no masses, no thyromegaly Respiratory: Breath sounds mildly diminished bilaterally, no wheezing, no crackles. Normal respiratory effort.   Cardiovascular: S1 & S2 heard, regular rate and rhythm. No extremity edema. No significant JVD. Abdomen: No distension, no tenderness, soft. Bowel sounds normal.  Musculoskeletal: no clubbing / cyanosis. No joint deformity upper and lower extremities.   Skin: no significant rashes, lesions, ulcers. Poor turgor. Neurologic: No facial asymmetry. Sensation intact. Strength 5/5 in all 4 limbs.  Psychiatric:  Alert and oriented x 3. Calm, cooperative.     Labs on Admission: I have personally reviewed following labs and imaging studies  CBC: Recent Labs  Lab  09/29/17 1920  WBC 9.8  HGB 13.8  HCT 41.2  MCV 83.4  PLT 086   Basic Metabolic Panel: Recent Labs  Lab 09/29/17 1920  NA 138  K  3.1*  CL 101  CO2 25  GLUCOSE 118*  BUN 12  CREATININE 1.68*  CALCIUM 8.8*   GFR: Estimated Creatinine Clearance: 47.1 mL/min (A) (by C-G formula based on SCr of 1.68 mg/dL (H)). Liver Function Tests: No results for input(s): AST, ALT, ALKPHOS, BILITOT, PROT, ALBUMIN in the last 168 hours. No results for input(s): LIPASE, AMYLASE in the last 168 hours. No results for input(s): AMMONIA in the last 168 hours. Coagulation Profile: No results for input(s): INR, PROTIME in the last 168 hours. Cardiac Enzymes: No results for input(s): CKTOTAL, CKMB, CKMBINDEX, TROPONINI in the last 168 hours. BNP (last 3 results) No results for input(s): PROBNP in the last 8760 hours. HbA1C: No results for input(s): HGBA1C in the last 72 hours. CBG: No results for input(s): GLUCAP in the last 168 hours. Lipid Profile: No results for input(s): CHOL, HDL, LDLCALC, TRIG, CHOLHDL, LDLDIRECT in the last 72 hours. Thyroid Function Tests: No results for input(s): TSH, T4TOTAL, FREET4, T3FREE, THYROIDAB in the last 72 hours. Anemia Panel: No results for input(s): VITAMINB12, FOLATE, FERRITIN, TIBC, IRON, RETICCTPCT in the last 72 hours. Urine analysis:    Component Value Date/Time   COLORURINE STRAW (A) 02/23/2016 1722   APPEARANCEUR Clear 09/28/2017 0822   LABSPEC 1.005 02/23/2016 1722   LABSPEC 1.002 04/21/2013 1022   PHURINE 6.0 02/23/2016 1722   GLUCOSEU Negative 09/28/2017 0822   GLUCOSEU Negative 04/21/2013 1022   HGBUR NEGATIVE 02/23/2016 1722   BILIRUBINUR Negative 09/28/2017 0822   BILIRUBINUR Negative 04/21/2013 1022   KETONESUR NEGATIVE 02/23/2016 1722   PROTEINUR Negative 09/28/2017 0822   PROTEINUR NEGATIVE 02/23/2016 1722   UROBILINOGEN 0.2 11/22/2014 2213   NITRITE Negative 09/28/2017 0822   NITRITE NEGATIVE 02/23/2016 1722   LEUKOCYTESUR  Negative 09/28/2017 0822   LEUKOCYTESUR Negative 04/21/2013 1022   Sepsis Labs: @LABRCNTIP (procalcitonin:4,lacticidven:4) )No results found for this or any previous visit (from the past 240 hour(s)).   Radiological Exams on Admission: Dg Chest 2 View  Result Date: 09/29/2017 CLINICAL DATA:  67 y/o  M; chest pain. EXAM: CHEST - 2 VIEW COMPARISON:  06/12/2017 chest radiograph FINDINGS: Stable heart size and mediastinal contours are within normal limits. Both lungs are clear. The visualized skeletal structures are unremarkable. IMPRESSION: No acute pulmonary process identified. Electronically Signed   By: Kristine Garbe M.D.   On: 09/29/2017 20:22   Ct Angio Chest Pe W/cm &/or Wo Cm  Result Date: 09/29/2017 CLINICAL DATA:  Shortness of breath.  Chest pain. EXAM: CT ANGIOGRAPHY CHEST WITH CONTRAST TECHNIQUE: Multidetector CT imaging of the chest was performed using the standard protocol during bolus administration of intravenous contrast. Multiplanar CT image reconstructions and MIPs were obtained to evaluate the vascular anatomy. CONTRAST:  155mL ISOVUE-370 IOPAMIDOL (ISOVUE-370) INJECTION 76% COMPARISON:  Radiographs of same day.  CT scan of January 12, 2013. FINDINGS: Cardiovascular: Satisfactory opacification of the pulmonary arteries to the segmental level. No evidence of pulmonary embolism. Normal heart size. No pericardial effusion. Atherosclerosis of thoracic aorta is noted without aneurysm or dissection. Coronary artery calcifications are noted. Mediastinum/Nodes: No enlarged mediastinal, hilar, or axillary lymph nodes. Thyroid gland, trachea, and esophagus demonstrate no significant findings. Lungs/Pleura: Lungs are clear. No pleural effusion or pneumothorax. Upper Abdomen: No acute abnormality. Musculoskeletal: No chest wall abnormality. No acute or significant osseous findings. Review of the MIP images confirms the above findings. IMPRESSION: No definite evidence of pulmonary embolus.  Coronary artery calcifications are noted suggesting coronary artery disease. Aortic Atherosclerosis (ICD10-I70.0). Electronically Signed   By:  Marijo Conception, M.D.   On: 09/29/2017 20:51    EKG: Independently reviewed. Sinus tachycardia (rate 127), lateral ST-depression.   Assessment/Plan  1. Chest pain; CAD - Presents with mild chest pain after becoming acutely lightheaded and diaphoretic upon standing  - He has known CAD with stents  - Treated with ASA 324 mg and NTG with EMS prior to arrival; pain resolved before arrival in ED  - EKG with sinus tachycardia (rate 127) and ST-depression in lateral leads  - Initial troponin undetectable, CXR without acute findings  - Continue cardiac monitoring, obtain serial troponin measurements, continue ASA, statin, and beta-blocker, echo ordered   2. Near syncope; orthostatic hypotension  - Presents with acute lightheadedness upon standing  - Hypotensive on standing in ED with 36 mmHg drop in SBP  - Suspected secondary to hypovolemia and treated with 2 liters IVF in ED  - Continue IVF hydration, continue cardiac monitoring, hold Lasix, update echocardiogram, repeat orthostatic vitals in am after further hydration   3. Hypokalemia  - Serum potassium is 3.1 on admission - 20 mEq oral potassium given and KCl added to IVF  - Continue cardiac monitoring, repeat chemistries in am    4. CKD stage III  - SCr is 1.68 on admission, similar to priors  - Renally-dose medications, avoid nephrotoxins    5. COPD  - No wheezing or dyspnea on admission  - Continue ICS/LABA, Spiriva, Daliresp, and prn albuterol   6. Chronic diastolic CHF  - Appears hypovolemic on admission  - Treated with 2 liters IVF in ED and continued on gentle IVF hydration  - Hold Lasix, continue beta-blocker as tolerated, follow daily wts  - Update echo as above in setting of near-syncope and chest pain    DVT prophylaxis: sq heparin  Code Status: Full  Family Communication:  Discussed with patient Consults called: None Admission status: Observation    Vianne Bulls, MD [facility] Pager [phone]  If 7PM-7AM, please contact night-coverage [email or web address] Password [facility]  09/30/2017, 12:52 AM

## 2017-09-30 NOTE — Progress Notes (Signed)
PROGRESS NOTE    LIDO MASKE  PPI:[id number] DOB: 1951/04/24 DOA: 09/29/2017 PCP: Philmore Pali, NP   Brief Narrative:  67 year old with past medical history relevant for COPD stage III by PFT on 12/22/2017, coronary artery disease status post mid RCA stent with proximal moderate RCA stenosis, small first diagonal and left circumflex, proximal to mid LAD by cath on 09/29/2015, stage III chronic kidney disease, hyperlipidemia, hypertension, chronic diastolic heart failure, BPH who came in with syncope/presyncope and chest pain with found to be orthostatic.   Assessment & Plan:   Principal Problem:   Chest pain at rest Active Problems:   Labile essential hypertension   COPD (chronic obstructive pulmonary disease) (HCC)   CKD (chronic kidney disease) stage 3, GFR 30-59 ml/min (HCC)   CAD S/P PCI DES to RCA   Hypokalemia   Near syncope   Orthostatic hypotension   Chest pain   #) Orthostasis/syncope: Patient is status post 2 L of IV fluid resuscitation.  Unclear if this is truly orthostasis due to volume status possibly from diuresis versus autonomic failure.  His syncope is additionally concerning for cardiac etiology as he would have multiple reasons to have syncope including dysrhythmia coronary artery pain. -Echo -Hold diuretics  #) Chest pain in setting of coronary artery disease: Patient continues to report intermittent chest pain approximately 2-4 out of 10 in severity.  Unclear if this is related to his chronic stable angina or worsening.  He does have some evidence of worsening chest pain on exertion. -Cardiology consult - Continue atorvastatin 80 mg and ezetimibe -Continue metoprolol succinate 50 mg daily -Continue aspirin 81 mg daily -Echo pending  #) Chronic diastolic heart failure: -Hold home furosemide 20 mg daily  #) Hypertension/hyperlipidemia: -Continue amlodipine 5 mg daily -Continue beta-blocker -Continue statin and ezetimibe  #) Chronic kidney disease stage  III: -Stable  #) "stage III COPD: -Continue to trend BMP daily -Continue LABA/ICS -Continue as needed bronchodilators -Continue Roflumilast 500 MCG daily  #) Pain/psych: -Continue alprazolam as needed -Continue cyclobenzaprine 10 mg as needed  Fluids: Restricted Electrodes: Moderate supplement Nutrition: Heart healthy diet  Prophylaxis: Enoxaparin  Disposition: Pending echo and cardiology evaluation  Full code  Consultants:   Cardiology  Procedures: (Don't include imaging studies which can be auto populated. Include things that cannot be auto populated i.e. Echo, Carotid and venous dopplers, Foley, Bipap, HD, tubes/drains, wound vac, central lines etc)  Echo 09/30/2017: Pending  Antimicrobials: (specify start and planned stop date. Auto populated tables are space occupying and do not give end dates)  None   Subjective: Patient reports some mild 4 out of 10 chest pain that was relieved with morphine.  He reports his chest pain is similar to his prior angina.  He denies any nausea, vomiting, abdominal pain, syncope, presyncope at this time.  Objective: Vitals:   09/30/17 1000 09/30/17 1113 09/30/17 1200 09/30/17 1300  BP: 134/83 135/78 131/81 127/75  Pulse: (!) 109 (!) 102 98 90  Resp:  15 16 16   Temp:      TempSrc:      SpO2: 99% 97% 96% 93%  Weight:      Height:        Intake/Output Summary (Last 24 hours) at 09/30/2017 1357 Last data filed at 09/30/2017 1345 Gross per 24 hour  Intake 3100 ml  Output 600 ml  Net 2500 ml   Filed Weights   09/29/17 1918  Weight: 91.6 kg (202 lb)    Examination:  General exam: Appears  calm and comfortable  Respiratory system: Clear to auscultation. Respiratory effort normal. Cardiovascular system: Regular rate and rhythm, no murmurs Gastrointestinal system: Soft, nondistended, plus bowel sounds, no rebound or guarding Central nervous system: Alert and oriented. No focal neurological deficits. Extremities: No lower  extremity edema Skin: No rashes on visible skin Psychiatry: Judgement and insight appear normal. Mood & affect appropriate.     Data Reviewed: I have personally reviewed following labs and imaging studies  CBC: Recent Labs  Lab 09/29/17 1920  WBC 9.8  HGB 13.8  HCT 41.2  MCV 83.4  PLT 867   Basic Metabolic Panel: Recent Labs  Lab 09/29/17 1920 09/30/17 0458  NA 138 138  K 3.1* 3.5  CL 101 105  CO2 25 24  GLUCOSE 118* 91  BUN 12 11  CREATININE 1.68* 1.52*  CALCIUM 8.8* 8.2*  MG  --  2.2   GFR: Estimated Creatinine Clearance: 52 mL/min (A) (by C-G formula based on SCr of 1.52 mg/dL (H)). Liver Function Tests: No results for input(s): AST, ALT, ALKPHOS, BILITOT, PROT, ALBUMIN in the last 168 hours. No results for input(s): LIPASE, AMYLASE in the last 168 hours. No results for input(s): AMMONIA in the last 168 hours. Coagulation Profile: No results for input(s): INR, PROTIME in the last 168 hours. Cardiac Enzymes: Recent Labs  Lab 09/30/17 0123 09/30/17 0458  TROPONINI <0.03 <0.03   BNP (last 3 results) No results for input(s): PROBNP in the last 8760 hours. HbA1C: No results for input(s): HGBA1C in the last 72 hours. CBG: Recent Labs  Lab 09/30/17 0614  GLUCAP 83   Lipid Profile: No results for input(s): CHOL, HDL, LDLCALC, TRIG, CHOLHDL, LDLDIRECT in the last 72 hours. Thyroid Function Tests: No results for input(s): TSH, T4TOTAL, FREET4, T3FREE, THYROIDAB in the last 72 hours. Anemia Panel: No results for input(s): VITAMINB12, FOLATE, FERRITIN, TIBC, IRON, RETICCTPCT in the last 72 hours. Sepsis Labs: No results for input(s): PROCALCITON, LATICACIDVEN in the last 168 hours.  No results found for this or any previous visit (from the past 240 hour(s)).       Radiology Studies: Dg Chest 2 View  Result Date: 09/29/2017 CLINICAL DATA:  67 y/o  M; chest pain. EXAM: CHEST - 2 VIEW COMPARISON:  06/12/2017 chest radiograph FINDINGS: Stable heart size  and mediastinal contours are within normal limits. Both lungs are clear. The visualized skeletal structures are unremarkable. IMPRESSION: No acute pulmonary process identified. Electronically Signed   By: Kristine Garbe M.D.   On: 09/29/2017 20:22   Ct Angio Chest Pe W/cm &/or Wo Cm  Result Date: 09/29/2017 CLINICAL DATA:  Shortness of breath.  Chest pain. EXAM: CT ANGIOGRAPHY CHEST WITH CONTRAST TECHNIQUE: Multidetector CT imaging of the chest was performed using the standard protocol during bolus administration of intravenous contrast. Multiplanar CT image reconstructions and MIPs were obtained to evaluate the vascular anatomy. CONTRAST:  154mL ISOVUE-370 IOPAMIDOL (ISOVUE-370) INJECTION 76% COMPARISON:  Radiographs of same day.  CT scan of January 12, 2013. FINDINGS: Cardiovascular: Satisfactory opacification of the pulmonary arteries to the segmental level. No evidence of pulmonary embolism. Normal heart size. No pericardial effusion. Atherosclerosis of thoracic aorta is noted without aneurysm or dissection. Coronary artery calcifications are noted. Mediastinum/Nodes: No enlarged mediastinal, hilar, or axillary lymph nodes. Thyroid gland, trachea, and esophagus demonstrate no significant findings. Lungs/Pleura: Lungs are clear. No pleural effusion or pneumothorax. Upper Abdomen: No acute abnormality. Musculoskeletal: No chest wall abnormality. No acute or significant osseous findings. Review of the MIP images  confirms the above findings. IMPRESSION: No definite evidence of pulmonary embolus. Coronary artery calcifications are noted suggesting coronary artery disease. Aortic Atherosclerosis (ICD10-I70.0). Electronically Signed   By: Marijo Conception, M.D.   On: 09/29/2017 20:51        Scheduled Meds: . ALPRAZolam  1 mg Oral TID AC & HS  . aspirin EC  81 mg Oral Daily  . atorvastatin  80 mg Oral q1800  . ezetimibe  10 mg Oral Daily  . heparin  5,000 Units Subcutaneous Q8H  . metoprolol  succinate  25 mg Oral Daily  . mometasone-formoterol  2 puff Inhalation BID  . pantoprazole  40 mg Oral Daily  . roflumilast  500 mcg Oral Daily  . sodium chloride flush  3 mL Intravenous Q12H  . tamsulosin  0.4 mg Oral Daily  . tiotropium  18 mcg Inhalation Daily   Continuous Infusions:   LOS: 0 days    Time spent: [place], MD [facility]   If 7PM-7AM, please contact night-coverage [email or web address] Password [facility] 09/30/2017, 1:57 PM

## 2017-09-30 NOTE — ED Notes (Signed)
Heart Healthy Diet was ordered for Lunch. 

## 2017-09-30 NOTE — ED Notes (Signed)
Pt c/o CP that is a 5/10.  Repeating EKG and gave 4mg  of morphine. Paged admitting.

## 2017-10-01 DIAGNOSIS — N183 Chronic kidney disease, stage 3 (moderate): Secondary | ICD-10-CM | POA: Diagnosis not present

## 2017-10-01 DIAGNOSIS — I2510 Atherosclerotic heart disease of native coronary artery without angina pectoris: Secondary | ICD-10-CM | POA: Diagnosis not present

## 2017-10-01 DIAGNOSIS — R0789 Other chest pain: Secondary | ICD-10-CM | POA: Diagnosis not present

## 2017-10-01 DIAGNOSIS — R079 Chest pain, unspecified: Secondary | ICD-10-CM

## 2017-10-01 DIAGNOSIS — I130 Hypertensive heart and chronic kidney disease with heart failure and stage 1 through stage 4 chronic kidney disease, or unspecified chronic kidney disease: Secondary | ICD-10-CM | POA: Diagnosis not present

## 2017-10-01 DIAGNOSIS — I951 Orthostatic hypotension: Secondary | ICD-10-CM | POA: Diagnosis not present

## 2017-10-01 DIAGNOSIS — J449 Chronic obstructive pulmonary disease, unspecified: Secondary | ICD-10-CM | POA: Diagnosis not present

## 2017-10-01 DIAGNOSIS — F17290 Nicotine dependence, other tobacco product, uncomplicated: Secondary | ICD-10-CM | POA: Diagnosis not present

## 2017-10-01 DIAGNOSIS — Z79899 Other long term (current) drug therapy: Secondary | ICD-10-CM | POA: Diagnosis not present

## 2017-10-01 DIAGNOSIS — I5030 Unspecified diastolic (congestive) heart failure: Secondary | ICD-10-CM | POA: Diagnosis not present

## 2017-10-01 DIAGNOSIS — Z7982 Long term (current) use of aspirin: Secondary | ICD-10-CM | POA: Diagnosis not present

## 2017-10-01 LAB — GLUCOSE, CAPILLARY: Glucose-Capillary: 86 mg/dL (ref 65–99)

## 2017-10-01 MED ORDER — METOPROLOL SUCCINATE ER 25 MG PO TB24: 25.0000 mg | ORAL_TABLET | Freq: Every day | ORAL | 1 refills | Status: DC

## 2017-10-01 NOTE — Discharge Instructions (Signed)
Furosemide tablets °What is this medicine? °FUROSEMIDE (fyoor [place] se mide) is a diuretic. It helps you make more urine and to lose salt and excess water from your body. This medicine is used to treat high blood pressure, and edema or swelling from heart, kidney, or liver disease. °This medicine may be used for other purposes; ask your health care provider or pharmacist if you have questions. °COMMON BRAND NAME(S): Active-Medicated Specimen Kit, Delone, Diuscreen, Lasix, RX Specimen Collection Kit, Specimen Collection Kit, URINX Medicated Specimen Collection °What should I tell my health care provider before I take this medicine? °They need to know if you have any of these conditions: °-abnormal blood electrolytes °-diarrhea or vomiting °-gout °-heart disease °-kidney disease, small amounts of urine, or difficulty passing urine °-liver disease °-thyroid disease °-an unusual or allergic reaction to furosemide, sulfa drugs, other medicines, foods, dyes, or preservatives °-pregnant or trying to get pregnant °-breast-feeding °How should I use this medicine? °Take this medicine by mouth with a glass of water. Follow the directions on the prescription label. You may take this medicine with or without food. If it upsets your stomach, take it with food or milk. Do not take your medicine more often than directed. Remember that you will need to pass more urine after taking this medicine. Do not take your medicine at a time of day that will cause you problems. Do not take at bedtime. °Talk to your pediatrician regarding the use of this medicine in children. While this drug may be prescribed for selected conditions, precautions do apply. °Overdosage: If you think you have taken too much of this medicine contact a poison control center or emergency room at once. °NOTE: This medicine is only for you. Do not share this medicine with others. °What if I miss a dose? °If you miss a dose, take it as soon as you can. If it is almost time  for your next dose, take only that dose. Do not take double or extra doses. °What may interact with this medicine? °-aspirin and aspirin-like medicines °-certain antibiotics °-chloral hydrate °-cisplatin °-cyclosporine °-digoxin °-diuretics °-laxatives °-lithium °-medicines for blood pressure °-medicines that relax muscles for surgery °-methotrexate °-NSAIDs, medicines for pain and inflammation like ibuprofen, naproxen, or indomethacin °-phenytoin °-steroid medicines like prednisone or cortisone °-sucralfate °-thyroid hormones °This list may not describe all possible interactions. Give your health care provider a list of all the medicines, herbs, non-prescription drugs, or dietary supplements you use. Also tell them if you smoke, drink alcohol, or use illegal drugs. Some items may interact with your medicine. °What should I watch for while using this medicine? °Visit your doctor or health care professional for regular checks on your progress. Check your blood pressure regularly. Ask your doctor or health care professional what your blood pressure should be, and when you should contact him or her. If you are a diabetic, check your blood sugar as directed. °You may need to be on a special diet while taking this medicine. Check with your doctor. Also, ask how many glasses of fluid you need to drink a day. You must not get dehydrated. °You may get drowsy or dizzy. Do not drive, use machinery, or do anything that needs mental alertness until you know how this drug affects you. Do not stand or sit up quickly, especially if you are an older patient. This reduces the risk of dizzy or fainting spells. Alcohol can make you more drowsy and dizzy. Avoid alcoholic drinks. °This medicine can make you more sensitive   to the sun. Keep out of the sun. If you cannot avoid being in the sun, wear protective clothing and use sunscreen. Do not use sun lamps or tanning beds/booths. What side effects may I notice from receiving this  medicine? Side effects that you should report to your doctor or health care professional as soon as possible: -blood in urine or stools -dry mouth -fever or chills -hearing loss or ringing in the ears -irregular heartbeat -muscle pain or weakness, cramps -skin rash -stomach upset, pain, or nausea -tingling or numbness in the hands or feet -unusually weak or tired -vomiting or diarrhea -yellowing of the eyes or skin Side effects that usually do not require medical attention (report to your doctor or health care professional if they continue or are bothersome): -headache -loss of appetite -unusual bleeding or bruising This list may not describe all possible side effects. Call your doctor for medical advice about side effects. You may report side effects to FDA at 1-800-FDA-1088. Where should I keep my medicine? Keep out of the reach of children. Store at room temperature between 15 and 30 degrees C (59 and 86 degrees F). Protect from light. Throw away any unused medicine after the expiration date. NOTE: This sheet is a summary. It may not cover all possible information. If you have questions about this medicine, talk to your doctor, pharmacist, or health care provider.  2018 Elsevier/Gold Standard (2014-08-14 13:49:50)

## 2017-10-01 NOTE — Progress Notes (Signed)
Subjective:  Says he feels better today.  No chest pain today.  Blood pressure appears to have come up some.  Objective:  Vital Signs in the last 24 hours: BP (!) 145/82   Pulse 95   Temp 98.1 F (36.7 C) (Oral)   Resp 20   Ht 5\' 9"  (1.753 m)   Wt 92.5 kg (204 lb)   SpO2 97%   BMI 30.13 kg/m   Physical Exam: Appears somewhat older than stated age in no acute distress Lungs:  Clear Cardiac:  Regular rhythm, normal S1 and S2, no S3 Extremities:  No edema present  Intake/Output from previous day: 04/26 0701 - 04/27 0700 In: 1240 [P.O.:240; I.V.:1000] Out: 1550 [Urine:1550]  Weight Filed Weights   09/29/17 1918 09/30/17 1626 10/01/17 0445  Weight: 91.6 kg (202 lb) 93.9 kg (207 lb 1.6 oz) 92.5 kg (204 lb)    Lab Results: Basic Metabolic Panel: Recent Labs    09/29/17 1920 09/30/17 0458  NA 138 138  K 3.1* 3.5  CL 101 105  CO2 25 24  GLUCOSE 118* 91  BUN 12 11  CREATININE 1.68* 1.52*   CBC: Recent Labs    09/29/17 1920  WBC 9.8  HGB 13.8  HCT 41.2  MCV 83.4  PLT 299   Cardiac Enzymes: Troponin (Point of Care Test) Recent Labs    09/29/17 1931  TROPIPOC 0.00   Cardiac Panel (last 3 results) Recent Labs    09/30/17 0123 09/30/17 0458 09/30/17 1359  TROPONINI <0.03 <0.03 <0.03    Telemetry: Sinus tachycardia  Assessment/Plan:  1.  Atypical chest pain with negative enzymes 2.  Orthostasis appears to be better 3.  Obesity 4.  Chronic kidney disease  Recommendations:  His orthostasis appears better but blood pressure still mildly elevated.  Should be able to go home with outpatient follow-up at this point. His echocardiogram showed preserved LV function.  I would have him wear venous support stockings.  According to his wife he has been spending a lot of time in the bed because of back pain and numbness in his legs.  This could be making his orthostasis worse and he is advised to avoid bed rest.    W. Doristine Church  MD  [facility]  10/01/2017, 10:46 AM

## 2017-10-01 NOTE — Progress Notes (Signed)
Discharge instruction was given to pt and family.  Neala Miggins, RN 

## 2017-10-01 NOTE — Discharge Summary (Signed)
Physician Discharge Summary  William Mueller HAL:[id number] DOB: Dec 25, 1950 DOA: 09/29/2017  PCP: Philmore Pali, NP  Admit date: 09/29/2017 Discharge date: 10/01/2017  Admitted From: Home Disposition:  Home  Recommendations for Outpatient Follow-up:  1. Follow up with PCP in 1-2 weeks 2. Please obtain BMP/CBC in one week   Home Health: no Equipment/Devices: no  Discharge Condition: stable CODE STATUS:FULL Diet recommendation: Heart Healthy  Brief/Interim Summary:  #) Orthostasis: Patient was admitted with orthostasis that improved with 2 L of IV fluids.  It was felt that this was most likely secondary to overdiuresis with oral furosemide at home.  His symptoms improved with withholding his home diuretics and decreasing his metoprolol dose.  #) Chest pain in the setting of coronary artery disease: Patient did report some small episodes of chest pain ranging in 2-4 out of 10 in severity.  He does have chronic stable angina however this appeared to be somewhat worsening.  Patient was seen by cardiology who felt that if patient's troponins were negative and his EKG was unchanged from prior that he did not need any further intervention.  An echo showed only chronic diastolic heart failure.  Patient was continued on his home metoprolol, atorvastatin, Zetia, aspirin.  #) Chronic diastolic heart failure: Echo showed grade 2 diastolic dysfunction with normal EF.  Patient's home furosemide was held.  Patient was told to restart and weigh himself at home daily.  #) hypertension: Patient was continued on amlodipine 5 mg, beta-blocker.  #) stage III chronic kidney disease: This was stable  #) Gold stage III COPD: Patient was continued on home LABA/ICS, Tio tropium, Roflumilast, PRN bronchodilators.  #) Pain/psych: Patient was continued on home alprazolam and cyclamens being.  Discharge Diagnoses:  Principal Problem:   Chest pain at rest Active Problems:   Labile essential hypertension   COPD  (chronic obstructive pulmonary disease) (HCC)   CKD (chronic kidney disease) stage 3, GFR 30-59 ml/min (HCC)   CAD S/P PCI DES to RCA   Hypokalemia   Near syncope   Orthostatic hypotension   Chest pain    Discharge Instructions  Discharge Instructions    Call MD for:  difficulty breathing, headache or visual disturbances   Complete by:  As directed    Call MD for:  hives   Complete by:  As directed    Call MD for:  persistant nausea and vomiting   Complete by:  As directed    Call MD for:  redness, tenderness, or signs of infection (pain, swelling, redness, odor or green/yellow discharge around incision site)   Complete by:  As directed    Call MD for:  severe uncontrolled pain   Complete by:  As directed    Call MD for:  temperature >100.4   Complete by:  As directed    Diet - low sodium heart healthy   Complete by:  As directed    Discharge instructions   Complete by:  As directed    Follow-up with her primary care doctor.  Please weigh yourself every day and write down your weight.   Increase activity slowly   Complete by:  As directed      Allergies as of 10/01/2017      Reactions   Paroxetine Hcl Shortness Of Breath, Palpitations   Serotonin Reuptake Inhibitors (ssris) Shortness Of Breath, Palpitations   Diazepam Other (See Comments)   Rapid heartrate   Doxycycline Monohydrate Other (See Comments)   Unknown allergic reaction   Escitalopram Oxalate  Other (See Comments)   serotonin Syndrome   Lorazepam Other (See Comments)   Unknown allergic reaction   Tetracyclines & Related Itching, Rash      Medication List    TAKE these medications   acetaminophen 500 MG tablet Commonly known as:  TYLENOL Take 1,000 mg by mouth daily as needed for headache.   ALPRAZolam 3 MG 24 hr tablet Commonly known as:  XANAX XR Take 1-3 mg by mouth 2 (two) times daily. 1 MG IN THE MORNING AND 3 MG IN THE EVENING   amLODipine 5 MG tablet Commonly known as:  NORVASC Take 1 tablet  (5 mg total) by mouth daily. What changed:  additional instructions   aspirin 81 MG EC tablet Take 1 tablet (81 mg total) by mouth daily. What changed:  additional instructions   atorvastatin 80 MG tablet Commonly known as:  LIPITOR Take 1 tablet (80 mg total) by mouth daily. What changed:  additional instructions   budesonide-formoterol 160-4.5 MCG/ACT inhaler Commonly known as:  SYMBICORT Inhale 2 puffs into the lungs 2 (two) times daily. What changed:  additional instructions   cyclobenzaprine 10 MG tablet Commonly known as:  FLEXERIL Take 10 mg by mouth 3 (three) times daily as needed for muscle spasms.   ezetimibe 10 MG tablet Commonly known as:  ZETIA Take 1 tablet (10 mg total) by mouth daily. What changed:  additional instructions   furosemide 20 MG tablet Commonly known as:  LASIX Take 20 mg by mouth daily.   metoprolol succinate 25 MG 24 hr tablet Commonly known as:  TOPROL-XL Take 1 tablet (25 mg total) by mouth daily. Start taking on:  10/02/2017 What changed:    medication strength  See the new instructions.   NITROSTAT 0.4 MG SL tablet Generic drug:  nitroGLYCERIN PLACE 1 TABLET UNDER TONGUE EVERY 5 MINUTES AS NEEDED FOR CHEST PAIN.CALL 911 AFTER 3RD TABLET.   omeprazole 40 MG capsule Commonly known as:  PRILOSEC Take 1 capsule (40 mg total) by mouth daily. What changed:  additional instructions   OXYGEN Inhale 2 L into the lungs daily as needed (oxygen).   roflumilast 500 MCG Tabs tablet Commonly known as:  DALIRESP Take 1 tablet (500 mcg total) by mouth daily. What changed:  additional instructions   tamsulosin 0.4 MG Caps capsule Commonly known as:  FLOMAX Take 1 capsule (0.4 mg total) by mouth daily.   tiotropium 18 MCG inhalation capsule Commonly known as:  SPIRIVA Place 1 capsule (18 mcg total) into inhaler and inhale daily. What changed:  additional instructions   albuterol 108 (90 Base) MCG/ACT inhaler Commonly known as:   PROVENTIL HFA;VENTOLIN HFA Inhale into the lungs every 6 (six) hours as needed for wheezing or shortness of breath.   VENTOLIN HFA 108 (90 Base) MCG/ACT inhaler Generic drug:  albuterol INHALE 1 TO 2 PUFFS INTO THE LUNGS EVERY 6 HOURS AS NEEDED FOR WHEEZING OR SHORTNESS OF BREATH.   albuterol (2.5 MG/3ML) 0.083% nebulizer solution Commonly known as:  PROVENTIL Take 3 mLs (2.5 mg total) by nebulization every 6 (six) hours as needed for wheezing or shortness of breath.       Allergies  Allergen Reactions  . Paroxetine Hcl Shortness Of Breath and Palpitations  . Serotonin Reuptake Inhibitors (Ssris) Shortness Of Breath and Palpitations  . Diazepam Other (See Comments)    Rapid heartrate  . Doxycycline Monohydrate Other (See Comments)    Unknown allergic reaction  . Escitalopram Oxalate Other (See Comments)  serotonin Syndrome  . Lorazepam Other (See Comments)    Unknown allergic reaction  . Tetracyclines & Related Itching and Rash    Consultations:  Cardiology   Procedures/Studies: Dg Chest 2 View  Result Date: 09/29/2017 CLINICAL DATA:  67 y/o  M; chest pain. EXAM: CHEST - 2 VIEW COMPARISON:  06/12/2017 chest radiograph FINDINGS: Stable heart size and mediastinal contours are within normal limits. Both lungs are clear. The visualized skeletal structures are unremarkable. IMPRESSION: No acute pulmonary process identified. Electronically Signed   By: Kristine Garbe M.D.   On: 09/29/2017 20:22   Ct Angio Chest Pe W/cm &/or Wo Cm  Result Date: 09/29/2017 CLINICAL DATA:  Shortness of breath.  Chest pain. EXAM: CT ANGIOGRAPHY CHEST WITH CONTRAST TECHNIQUE: Multidetector CT imaging of the chest was performed using the standard protocol during bolus administration of intravenous contrast. Multiplanar CT image reconstructions and MIPs were obtained to evaluate the vascular anatomy. CONTRAST:  164mL ISOVUE-370 IOPAMIDOL (ISOVUE-370) INJECTION 76% COMPARISON:  Radiographs of  same day.  CT scan of January 12, 2013. FINDINGS: Cardiovascular: Satisfactory opacification of the pulmonary arteries to the segmental level. No evidence of pulmonary embolism. Normal heart size. No pericardial effusion. Atherosclerosis of thoracic aorta is noted without aneurysm or dissection. Coronary artery calcifications are noted. Mediastinum/Nodes: No enlarged mediastinal, hilar, or axillary lymph nodes. Thyroid gland, trachea, and esophagus demonstrate no significant findings. Lungs/Pleura: Lungs are clear. No pleural effusion or pneumothorax. Upper Abdomen: No acute abnormality. Musculoskeletal: No chest wall abnormality. No acute or significant osseous findings. Review of the MIP images confirms the above findings. IMPRESSION: No definite evidence of pulmonary embolus. Coronary artery calcifications are noted suggesting coronary artery disease. Aortic Atherosclerosis (ICD10-I70.0). Electronically Signed   By: Marijo Conception, M.D.   On: 09/29/2017 20:51    09/30/2017 echo:- Left ventricle: The cavity size was normal. Wall thickness was   normal. Systolic function was vigorous. The estimated ejection   fraction was in the range of 65% to 70%. Wall motion was normal;   there were no regional wall motion abnormalities. Features are   consistent with a pseudonormal left ventricular filling pattern,   with concomitant abnormal relaxation and increased filling   pressure (grade 2 diastolic dysfunction).   Subjective:   Discharge Exam: Vitals:   10/01/17 0800 10/01/17 0907  BP: (!) 145/82   Pulse: 95   Resp:    Temp:    SpO2: 97% 97%   Vitals:   09/30/17 2038 10/01/17 0445 10/01/17 0800 10/01/17 0907  BP: 122/69 115/67 (!) 145/82   Pulse: 95 84 95   Resp:  20    Temp: 98.4 F (36.9 C) 98.1 F (36.7 C)    TempSrc: Oral Oral    SpO2:  97% 97% 97%  Weight:  92.5 kg (204 lb)    Height:       General exam: Appears calm and comfortable  Respiratory system: Clear to auscultation.  Respiratory effort normal. Cardiovascular system: Regular rate and rhythm, no murmurs Gastrointestinal system: Soft, nondistended, plus bowel sounds, no rebound or guarding Central nervous system: Alert and oriented. No focal neurological deficits. Extremities: trace lower extremity edema Skin: No rashes on visible skin Psychiatry: Judgement and insight appear normal. Mood & affect appropriate.       The results of significant diagnostics from this hospitalization (including imaging, microbiology, ancillary and laboratory) are listed below for reference.     Microbiology: No results found for this or any previous visit (from the past 240  hour(s)).   Labs: BNP (last 3 results) Recent Labs    11/06/16 1744 03/09/17 1724  BNP 21.0 25.3   Basic Metabolic Panel: Recent Labs  Lab 09/29/17 1920 09/30/17 0458  NA 138 138  K 3.1* 3.5  CL 101 105  CO2 25 24  GLUCOSE 118* 91  BUN 12 11  CREATININE 1.68* 1.52*  CALCIUM 8.8* 8.2*  MG  --  2.2   Liver Function Tests: No results for input(s): AST, ALT, ALKPHOS, BILITOT, PROT, ALBUMIN in the last 168 hours. No results for input(s): LIPASE, AMYLASE in the last 168 hours. No results for input(s): AMMONIA in the last 168 hours. CBC: Recent Labs  Lab 09/29/17 1920  WBC 9.8  HGB 13.8  HCT 41.2  MCV 83.4  PLT 299   Cardiac Enzymes: Recent Labs  Lab 09/30/17 0123 09/30/17 0458 09/30/17 1359  TROPONINI <0.03 <0.03 <0.03   BNP: Invalid input(s): POCBNP CBG: Recent Labs  Lab 09/30/17 0614 10/01/17 0729  GLUCAP 83 86   D-Dimer No results for input(s): DDIMER in the last 72 hours. Hgb A1c No results for input(s): HGBA1C in the last 72 hours. Lipid Profile No results for input(s): CHOL, HDL, LDLCALC, TRIG, CHOLHDL, LDLDIRECT in the last 72 hours. Thyroid function studies Recent Labs    09/30/17 1629  TSH 0.885   Anemia work up No results for input(s): VITAMINB12, FOLATE, FERRITIN, TIBC, IRON, RETICCTPCT in the  last 72 hours. Urinalysis    Component Value Date/Time   COLORURINE STRAW (A) 02/23/2016 1722   APPEARANCEUR Clear 09/28/2017 0822   LABSPEC 1.005 02/23/2016 1722   LABSPEC 1.002 04/21/2013 1022   PHURINE 6.0 02/23/2016 1722   GLUCOSEU Negative 09/28/2017 0822   GLUCOSEU Negative 04/21/2013 1022   HGBUR NEGATIVE 02/23/2016 1722   BILIRUBINUR Negative 09/28/2017 0822   BILIRUBINUR Negative 04/21/2013 1022   KETONESUR NEGATIVE 02/23/2016 1722   PROTEINUR Negative 09/28/2017 0822   PROTEINUR NEGATIVE 02/23/2016 1722   UROBILINOGEN 0.2 11/22/2014 2213   NITRITE Negative 09/28/2017 0822   NITRITE NEGATIVE 02/23/2016 1722   LEUKOCYTESUR Negative 09/28/2017 0822   LEUKOCYTESUR Negative 04/21/2013 1022   Sepsis Labs Invalid input(s): PROCALCITONIN,  WBC,  LACTICIDVEN Microbiology No results found for this or any previous visit (from the past 240 hour(s)).   Time coordinating discharge: Over 30 minutes  SIGNED:   Cristy Folks, MD  [facility] 10/01/2017, 1:51 PM   If 7PM-7AM, please contact night-coverage [email or web address] Password [email or web address]

## 2017-10-05 DIAGNOSIS — J449 Chronic obstructive pulmonary disease, unspecified: Secondary | ICD-10-CM | POA: Diagnosis not present

## 2017-10-05 DIAGNOSIS — M5440 Lumbago with sciatica, unspecified side: Secondary | ICD-10-CM | POA: Diagnosis not present

## 2017-10-05 DIAGNOSIS — R29898 Other symptoms and signs involving the musculoskeletal system: Secondary | ICD-10-CM | POA: Diagnosis not present

## 2017-10-12 DIAGNOSIS — R0609 Other forms of dyspnea: Secondary | ICD-10-CM | POA: Diagnosis not present

## 2017-10-12 DIAGNOSIS — R600 Localized edema: Secondary | ICD-10-CM | POA: Diagnosis not present

## 2017-10-12 DIAGNOSIS — Z9981 Dependence on supplemental oxygen: Secondary | ICD-10-CM | POA: Diagnosis not present

## 2017-10-12 DIAGNOSIS — J449 Chronic obstructive pulmonary disease, unspecified: Secondary | ICD-10-CM | POA: Diagnosis not present

## 2017-10-14 DIAGNOSIS — Z961 Presence of intraocular lens: Secondary | ICD-10-CM | POA: Diagnosis not present

## 2017-10-28 ENCOUNTER — Other Ambulatory Visit: Payer: Self-pay | Admitting: *Deleted

## 2017-10-28 NOTE — Patient Outreach (Signed)
[place] [facility]) Care Management  10/28/2017  William Mueller Aug 29, 1950 [id number]   [place] Monthly Outreach  Referral Date:  07/22/2017 Referral Source:  Nurse Line Reason for Referral:  Lump to left side of neck that has been there for 1 month comes and goes.  Causing problems with swallowing Insurance:  [employer]   Outreach Attempt:  Outreach attempt #1 to patient for monthly follow up. No answer. RN Health Coach left HIPAA compliant voicemail message along with contact information.  Plan:  RN Health Coach will send unsuccessful outreach letter to patient.  RN Health Coach will make another outreach attempt to patient within 3-4 business days if no return call back from patient.  [place] [phone] [email or web address]

## 2017-10-31 ENCOUNTER — Other Ambulatory Visit: Payer: Self-pay | Admitting: Cardiovascular Disease

## 2017-11-01 ENCOUNTER — Other Ambulatory Visit: Payer: Self-pay

## 2017-11-02 ENCOUNTER — Other Ambulatory Visit: Payer: Self-pay | Admitting: *Deleted

## 2017-11-02 NOTE — Patient Outreach (Signed)
[place] [facility] [place]) Care Management  11/02/2017  William Mueller November 08, 1950 [id number]   [place] Monthly Outreach  Referral Date:07/22/2017 Referral Source:Nurse Line Reason for Referral:Lump to left side of neck that has been there for 1 month comes and goes. Causing problems with swallowing Insurance:[employer]   Outreach Attempt:  Outreach attempt #2 to patient for monthly follow up. No answer. RN Health Coach left HIPAA compliant voicemail message along with contact information.  Plan:  RN Health Coach will make another outreach attempt to patient within 3-4 business days if no return call back from patient.  [place] [phone] [email or web address]

## 2017-11-05 DIAGNOSIS — J449 Chronic obstructive pulmonary disease, unspecified: Secondary | ICD-10-CM | POA: Diagnosis not present

## 2017-11-07 ENCOUNTER — Other Ambulatory Visit: Payer: Self-pay | Admitting: *Deleted

## 2017-11-07 ENCOUNTER — Encounter: Payer: Self-pay | Admitting: *Deleted

## 2017-11-07 NOTE — Patient Outreach (Addendum)
[facility]) Care Management  [place]  11/07/2017   William Mueller [birth year] [id number]   [place] Monthly Outreach   Referral Date:  07/22/2017 Referral Source:  Nurse Line Reason for Referral:  Lump to left side of neck that has been there for 1 month comes and goes.  Causing problems with swallowing Insurance:  [employer]   Outreach Attempt:  Successful telephone outreach to patient's wife.  HIPAA verified with wife.  Wife states patient is sleeping and he often sleeps a lot during the day now a days.  States his shortness of breath is about the same, mainly with activity.  Reports he used his rescue inhaler once last week and he has started back using his oxygen as needed and at night.  Does report patient is feeling oxygen is drying him out and the new face mask does not seem to help.  Discussed with wife the possibility of having humidification added to home oxygen and encouraged her to contact DME agency to have them assist with adding to home oxygen.  Patient continues to have back pain with little improvement with the initiation of Neurontin.  Denies any falls or sick days.  States his blood pressure has been better since discharging from the hospital.  Yesterday's blood pressure was 122/78.   Encounter Medications:  Outpatient Encounter Medications as of 11/07/2017  Medication Sig Note  . acetaminophen (TYLENOL) 500 MG tablet Take 1,000 mg by mouth daily as needed for headache.    . albuterol (PROVENTIL HFA;VENTOLIN HFA) 108 (90 Base) MCG/ACT inhaler Inhale into the lungs every 6 (six) hours as needed for wheezing or shortness of breath.   Marland Kitchen albuterol (PROVENTIL) (2.5 MG/3ML) 0.083% nebulizer solution Take 3 mLs (2.5 mg total) by nebulization every 6 (six) hours as needed for wheezing or shortness of breath.   . ALPRAZolam (XANAX XR) 3 MG 24 hr tablet Take 1-3 mg by mouth 2 (two) times daily. 1 MG IN THE MORNING AND 3 MG IN THE EVENING  09/28/2017: Patient and wife reports taking 1 mg in am and 3 mg in the evening  . amLODipine (NORVASC) 5 MG tablet TAKE ONE TABLET DAILY   . aspirin 81 MG EC tablet Take 1 tablet (81 mg total) by mouth daily. (Patient taking differently: Take 81 mg by mouth daily. BREAKFAST)   . atorvastatin (LIPITOR) 80 MG tablet Take 1 tablet (80 mg total) by mouth daily. (Patient taking differently: Take 80 mg by mouth daily. DINNER)   . budesonide-formoterol (SYMBICORT) 160-4.5 MCG/ACT inhaler Inhale 2 puffs into the lungs 2 (two) times daily. (Patient taking differently: Inhale 2 puffs into the lungs 2 (two) times daily. BREAKFAST AND BEDTIME)   . ezetimibe (ZETIA) 10 MG tablet Take 1 tablet (10 mg total) by mouth daily. (Patient taking differently: Take 10 mg by mouth daily. DINNER)   . furosemide (LASIX) 20 MG tablet Take 20 mg by mouth daily.    Marland Kitchen gabapentin (NEURONTIN) 100 MG capsule Take 100 mg by mouth 2 (two) times daily. Take 2 pills twice a day   . metoprolol succinate (TOPROL-XL) 25 MG 24 hr tablet Take 1 tablet (25 mg total) by mouth daily.   Marland Kitchen NITROSTAT 0.4 MG SL tablet PLACE 1 TABLET UNDER TONGUE EVERY 5 MINUTES AS NEEDED FOR CHEST PAIN.CALL 911 AFTER 3RD TABLET. 11/01/2016: Used 2 tabs   . omeprazole (PRILOSEC) 40 MG capsule Take 1 capsule (40 mg total) by mouth daily. (Patient taking differently:  Take 40 mg by mouth daily. AM)   . OXYGEN Inhale 2 L into the lungs daily as needed (oxygen).    . roflumilast (DALIRESP) 500 MCG TABS tablet Take 1 tablet (500 mcg total) by mouth daily. (Patient taking differently: Take 500 mcg by mouth daily. LUNCH)   . tiotropium (SPIRIVA) 18 MCG inhalation capsule Place 1 capsule (18 mcg total) into inhaler and inhale daily. (Patient taking differently: Place 18 mcg into inhaler and inhale daily. AM)   . VENTOLIN HFA 108 (90 Base) MCG/ACT inhaler INHALE 1 TO 2 PUFFS INTO THE LUNGS EVERY 6 HOURS AS NEEDED FOR WHEEZING OR SHORTNESS OF BREATH.   . cyclobenzaprine  (FLEXERIL) 10 MG tablet Take 10 mg by mouth 3 (three) times daily as needed for muscle spasms. 11/07/2017: Wife reports patient is not taking  . tamsulosin (FLOMAX) 0.4 MG CAPS capsule Take 1 capsule (0.4 mg total) by mouth daily. (Patient not taking: Reported on 11/07/2017) 11/07/2017: Wife reports patient not taking any longer   No facility-administered encounter medications on file as of 11/07/2017.     Functional Status:  In your present state of health, do you have any difficulty performing the following activities: 09/30/2017 08/10/2017  Hearing? N N  Comment - -  Vision? N N  Comment - -  Difficulty concentrating or making decisions? N N  Walking or climbing stairs? Y N  Dressing or bathing? N N  Doing errands, shopping? N -  Preparing Food and eating ? - -  Using the Toilet? - -  In the past six months, have you accidently leaked urine? - -  Do you have problems with loss of bowel control? - -  Managing your Medications? - -  Managing your Finances? - -  Housekeeping or managing your Housekeeping? - -  Some recent data might be hidden    Fall/Depression Screening: Fall Risk  09/28/2017 08/03/2017  Falls in the past year? No No   PHQ 2/9 Scores 08/03/2017 07/22/2017  PHQ - 2 Score 0 2  PHQ- 9 Score - 14    [facility] CM Care Plan Problem One     Most Recent Value  Care Plan Problem One  Knowledge defeciet related to self care management of COPD and lower extremity weakness  Role Documenting the Problem One  [place] for Problem One  Active  [facility] Long Term Goal   Patient will verbalize a decrease in back pain in the next 90 days.  [facility]N Long Term Goal Start Date  11/07/17  Interventions for Problem One Long Term Goal  Current care plan and goals reviewed with wife, encouraged to keep and attend medical appointments, encouraged wife to discuss increase in sleeping with neurologist and pain injection, reviewed medications and encouraged compliance, falls precautions and  preventions reviewed  [facility] CM Short Term Goal #1   Patient's wife will review Living Well with COPD Booklet with patient in the next 30 days.  [facility]N CM Short Term Goal #1 Start Date  11/07/17  Interventions for Short Term Goal #1  Encouraged wife to review COPD education with patient due to patient needing new glasses, discussed COPD zones with wife, encouraged wife to discuss sleep study with pulmonologist, medication compliance encouraged,   THN CM Short Term Goal #2   Patient and wife will discuss oxygen humidification with [employer] in the next 30 days.  THN CM Short Term Goal #2 Start Date  11/07/17  [facility] CM Short Term Goal #2 Met Date  11/07/17  Interventions for Short Term Goal #2  encouraged patient and wife to utilize oxygen with activity to help with shortness of breath, discussed possibility of having home oxygen humidification started to help with drying out, encouraged wife to contact [employer] to verify if new order is needed for oxygen humidification or could they come out to add humidification     Appointments:  Last appointment with primary care provider was January 2019 with a scheduled follow up appointment scheduled for December 13, 2017.  Wife states patient has neurology appointment tomorrow, 11/08/2017 and plans to discuss pain injection.  Encouraged wife to discuss increase in sleeping with primary care provider and neurologist.  Follow up with Pulmonologist is 01/11/2018.  Plan: RN Health Coach will make next monthly outreach to patient in the month of July. RN Health Coach will send primary care provider Quarterly Update.  [place] [phone] Jerson [email or web address]

## 2017-11-14 DIAGNOSIS — N183 Chronic kidney disease, stage 3 (moderate): Secondary | ICD-10-CM | POA: Diagnosis not present

## 2017-11-14 DIAGNOSIS — N2581 Secondary hyperparathyroidism of renal origin: Secondary | ICD-10-CM | POA: Diagnosis not present

## 2017-11-14 DIAGNOSIS — R809 Proteinuria, unspecified: Secondary | ICD-10-CM | POA: Diagnosis not present

## 2017-11-14 DIAGNOSIS — R5382 Chronic fatigue, unspecified: Secondary | ICD-10-CM | POA: Diagnosis not present

## 2017-11-14 DIAGNOSIS — I129 Hypertensive chronic kidney disease with stage 1 through stage 4 chronic kidney disease, or unspecified chronic kidney disease: Secondary | ICD-10-CM | POA: Diagnosis not present

## 2017-11-15 NOTE — Progress Notes (Signed)
Cardiology Office Note  Date:  11/16/2017   ID:  William Mueller, DOB 27-Jul-1950, MRN [id number]  PCP:  Philmore Pali, NP   Chief Complaint  Patient presents with  . OTHER    6 month f/u c/o elevated HR, joint/muscular pain, right hand numbness and 'head feeling stopped up/popping noise". Meds reviewed verbally with pt.    HPI:  William Mueller is a very pleasant 67 year old gentleman with  long history of smoking, COPD,  chronic renal insufficiency, CR 1.7 CAD  12/11/2013 to [place] with chest pain cardiac catheterization lab : occluded mid RCA. Stent was placed.  Repeat catheterization in October 2016 with patent stent,disease of a small diagonal and distal circumflex not amenable to intervention.  paroxysmal atrial tachycardia He presents today for follow-up of his coronary artery disease And COPD   09/2017 Chest pain and orthostasis Hospital records reviewed with the patient in detail Echo showed grade 2 diastolic dysfunction with normal EF. Was given IV fluids  chronic symptom of leg pain, back pain CBD oil, feel somewhat better  Off metoprolol, BP was low Having tachycardia, still on amlodipine Bothered by the tachycardia On lasix daily  EKG personally reviewed by myself on todays visit Normal sinus rhythm with rate 100 bpm ST and T wave abnormality in V4 through V6 1 and aVL  Other past medical history reviewed In the ER 03/09/2017 Feet swelling, tingling  Reports that he sits on the couch most of the day, no activity No regular exercise program given arthritic issues as above  hospital admission  July 26 2016 discharged February 20  admitted for chest pain, atypical Had adjustments to his blood pressure medications Seen by cardiology in the hospital, felt it was atypical Possible transient angina in the setting of uncontrolled hypertension Beta blocker dose was increased  Seen in the emergency room 11/01/2016 for chest pain Atypical in nature  In the ER  02/2016 ABD pain. He still does not know the cause of his pain  Previously called EMS HR 147, on couch, did not feel right Sinus tach on EKG he provided to [place] today  Felt he was having a panic attack Talked with EMS and heart rate improved No further episodes since that time  He reports having periodic tachycardia when he wakes up in the morning with associated hypoxemia. Numbers on his pulse oximeter are in the 80s  does not use oxygen at nighttime to sleep  April 2017 went to the emergency room with chest tightness, kept in the hospital, refused stress test, had cardiac catheterization showing patent stent, disease was relatively unchanged Date of catheterization 09/29/2015 Echocardiogram confirmed ejection fraction 65%  Previously did a PFT, technician reported that he passed out. He does not remember any significant difficulty but the test was abruptly stopped  Unable to tolerate isosorbide secondary to headache previously total cholesterol was 200  he presented to the hospital 02/26/2014 with chest pain. He seen by cardiology, rule out for MI, had normal EKG and discharged home. Echocardiogram July 2015 showed ejection fraction 55-60%  Creatinine in 10/22/2013 was 2.1  PMH:   has a past medical history of Anginal pain ([place]) (06/2017), Anxiety, Bronchitis (06/2017), Chronic chest pain, Chronic kidney disease (CKD) stage G3b/A1, moderately decreased glomerular filtration rate (GFR) between 30-44 mL/min/1.73 square meter and albuminuria creatinine ratio less than 30 mg/g (HCC), Chronic lower back pain, COPD (chronic obstructive pulmonary disease) ([place]), Coronary artery disease, Daily headache, Depression, Diastolic dysfunction, Dyspnea, GERD (gastroesophageal reflux disease), Hearing  loss, High cholesterol, Hypertension, Myocardial infarction ([place]) (2015), Nicotine addiction, Orthopnea, and Sleep apnea.  PSH:    Past Surgical History:  Procedure Laterality Date  . CARDIAC  CATHETERIZATION     MC x 1 stent  . CARDIAC CATHETERIZATION Left 03/12/2015   Procedure: Left Heart Cath and Coronary Angiography;  Surgeon: Leonie Man, MD;  Location: [place] [facility];  Service: Cardiovascular;  Laterality: Left;  . CARDIAC CATHETERIZATION N/A 09/29/2015   Procedure: Left Heart Cath and Coronary Angiography;  Surgeon: Wellington Hampshire, MD;  Location: [place] [facility];  Service: Cardiovascular;  Laterality: N/A;  . CATARACT EXTRACTION W/PHACO Right 08/10/2017   Procedure: CATARACT EXTRACTION PHACO AND INTRAOCULAR LENS PLACEMENT ([place]) RIGHT;  Surgeon: Leandrew Koyanagi, MD;  Location: [facility];  Service: Ophthalmology;  Laterality: Right;  . CORONARY ANGIOPLASTY     STENT PLACEMENT  . LEFT HEART CATHETERIZATION WITH CORONARY ANGIOGRAM N/A 12/12/2013   Procedure: LEFT HEART CATHETERIZATION WITH CORONARY ANGIOGRAM;  Surgeon: Wellington Hampshire, MD;  Location: [place] C[facility] LAB;  Service: Cardiovascular;  Laterality: N/A;    Current Outpatient Medications  Medication Sig Dispense Refill  . acetaminophen (TYLENOL) 500 MG tablet Take 1,000 mg by mouth daily as needed for headache.     . albuterol (PROVENTIL HFA;VENTOLIN HFA) 108 (90 Base) MCG/ACT inhaler Inhale into the lungs every 6 (six) hours as needed for wheezing or shortness of breath.    Marland Kitchen albuterol (PROVENTIL) (2.5 MG/3ML) 0.083% nebulizer solution Take 3 mLs (2.5 mg total) by nebulization every 6 (six) hours as needed for wheezing or shortness of breath. 30 vial 0  . ALPRAZolam (XANAX XR) 3 MG 24 hr tablet Take 1-3 mg by mouth 2 (two) times daily. 1 MG IN THE MORNING AND 3 MG IN THE EVENING    . amLODipine (NORVASC) 5 MG tablet Take 1 tablet (5 mg total) by mouth daily as needed. 90 tablet 3  . aspirin 81 MG EC tablet Take 1 tablet (81 mg total) by mouth daily. (Patient taking differently: Take 81 mg by mouth daily. BREAKFAST) 90 tablet 3  . atorvastatin (LIPITOR) 80 MG tablet Take 1 tablet (80 mg total) by  mouth daily. (Patient taking differently: Take 80 mg by mouth daily. DINNER) 90 tablet 3  . budesonide-formoterol (SYMBICORT) 160-4.5 MCG/ACT inhaler Inhale 2 puffs into the lungs 2 (two) times daily. (Patient taking differently: Inhale 2 puffs into the lungs 2 (two) times daily. BREAKFAST AND BEDTIME) 3 Inhaler 1  . cyclobenzaprine (FLEXERIL) 10 MG tablet Take 10 mg by mouth 3 (three) times daily as needed for muscle spasms.    Marland Kitchen ezetimibe (ZETIA) 10 MG tablet Take 1 tablet (10 mg total) by mouth daily. (Patient taking differently: Take 10 mg by mouth daily. DINNER) 90 tablet 3  . furosemide (LASIX) 20 MG tablet Take 20 mg by mouth daily.     Marland Kitchen gabapentin (NEURONTIN) 100 MG capsule Take 100 mg by mouth 2 (two) times daily. Take 2 pills twice a day    . metoprolol succinate (TOPROL-XL) 25 MG 24 hr tablet Take 1 tablet (25 mg total) by mouth daily. 90 tablet 3  . NITROSTAT 0.4 MG SL tablet PLACE 1 TABLET UNDER TONGUE EVERY 5 MINUTES AS NEEDED FOR CHEST PAIN.CALL 911 AFTER 3RD TABLET. 25 tablet 1  . omeprazole (PRILOSEC) 40 MG capsule Take 1 capsule (40 mg total) by mouth daily. (Patient taking differently: Take 40 mg by mouth daily. AM) 90 capsule 1  . OXYGEN Inhale  2 L into the lungs daily as needed (oxygen).     . roflumilast (DALIRESP) 500 MCG TABS tablet Take 1 tablet (500 mcg total) by mouth daily. (Patient taking differently: Take 500 mcg by mouth daily. LUNCH) 90 tablet 1  . tamsulosin (FLOMAX) 0.4 MG CAPS capsule Take 1 capsule (0.4 mg total) by mouth daily. 30 capsule 11  . tiotropium (SPIRIVA) 18 MCG inhalation capsule Place 1 capsule (18 mcg total) into inhaler and inhale daily. (Patient taking differently: Place 18 mcg into inhaler and inhale daily. AM) 90 capsule 1  . VENTOLIN HFA 108 (90 Base) MCG/ACT inhaler INHALE 1 TO 2 PUFFS INTO THE LUNGS EVERY 6 HOURS AS NEEDED FOR WHEEZING OR SHORTNESS OF BREATH. 54 g 0   No current facility-administered medications for this visit.       Allergies:   Paroxetine hcl; Serotonin reuptake inhibitors (ssris); Diazepam; Doxycycline monohydrate; Escitalopram oxalate; Lorazepam; and Tetracyclines & related   Social History:  The patient  reports that he has been smoking e-cigarettes.  He has smoked for the past 48.00 years. He has never used smokeless tobacco. He reports that he drinks about 3.6 oz of alcohol per week. He reports that he does not use drugs.   Family History:   family history includes Coronary artery disease in his brother; Heart disease in his father; Heart disease (age of onset: 45) in his brother.    Review of Systems: Review of Systems  Constitutional: Negative.   Respiratory: Negative.   Cardiovascular: Negative.   Gastrointestinal: Negative.   Musculoskeletal: Positive for back pain.       Left leg pain  Neurological: Negative.   Psychiatric/Behavioral: Negative.   All other systems reviewed and are negative.    PHYSICAL EXAM: VS:  BP 128/70 (BP Location: Left Arm, Patient Position: Sitting, Cuff Size: Normal)   Pulse 99   Ht 5\' 9"  (1.753 m)   Wt 202 lb 12 oz (92 kg)   BMI 29.94 kg/m  , BMI Body mass index is 29.94 kg/m. GEN: Well nourished, well developed, in no acute distress  HEENT: normal  Neck: no JVD, carotid bruits, or masses Cardiac: RRR; no murmurs, rubs, or gallops,no edema  Decreased lower extremity arterial pulses Respiratory:  Mildly decreased lung sounds throughout, normal work of breathing GI: soft, nontender, nondistended, + BS MS: no deformity or atrophy  Skin: warm and dry, no rash Neuro:  Strength and sensation are intact Psych: euthymic mood, full affect    Recent Labs: 03/09/2017: B Natriuretic Peptide 22.0 09/29/2017: Hemoglobin 13.8; Platelets 299 09/30/2017: BUN 11; Creatinine, Ser 1.52; Magnesium 2.2; Potassium 3.5; Sodium 138; TSH 0.885    Lipid Panel Lab Results  Component Value Date   CHOL 106 07/27/2016   HDL 40 (L) 07/27/2016   LDLCALC 42  07/27/2016   TRIG 118 07/27/2016      Wt Readings from Last 3 Encounters:  11/16/17 202 lb 12 oz (92 kg)  10/01/17 204 lb (92.5 kg)  09/28/17 202 lb (91.6 kg)       ASSESSMENT AND PLAN:  Essential hypertension - Plan: EKG 12-Lead Recommended he stop amlodipine Would restart metoprolol 25 mill grams daily Monitor heart rate and call our office for continues to run fast  Mixed hyperlipidemia - Plan: EKG 12-Lead Cholesterol is at goal on the current lipid regimen. No changes to the medications were made. Stable  Atrial tachycardia, paroxysmal (HCC) - Plan: EKG 12-Lead Denies paroxysmal tachycardia but rather heart rate running fast all the  time Though restart metoprolol as above  CAD S/P PCI DES to RCA - Plan: EKG 12-Lead Recently in the hospital for chest pain Workup negative, was dehydrated orthostatic Currently feels well, active at baseline  Anxiety/stress Managed by primary care  Chronic back pain, leg pain Previous doctor he Dopplers unrevealing Secondary to nerve compression   Total encounter time more than 25 minutes  Greater than 50% was spent in counseling and coordination of care with the patient  Disposition:   F/U  12 months   Orders Placed This Encounter  Procedures  . EKG 12-Lead     Signed, Esmond Plants, M.D., Ph.D. 11/16/2017  [place], [place]

## 2017-11-16 ENCOUNTER — Ambulatory Visit: Payer: PPO | Admitting: Cardiovascular Disease

## 2017-11-16 ENCOUNTER — Encounter: Payer: Self-pay | Admitting: Cardiovascular Disease

## 2017-11-16 VITALS — BP 128/70 | HR 99 | Ht 69.0 in | Wt 202.8 lb

## 2017-11-16 DIAGNOSIS — I471 Supraventricular tachycardia: Secondary | ICD-10-CM

## 2017-11-16 DIAGNOSIS — J432 Centrilobular emphysema: Secondary | ICD-10-CM | POA: Diagnosis not present

## 2017-11-16 DIAGNOSIS — N183 Chronic kidney disease, stage 3 unspecified: Secondary | ICD-10-CM

## 2017-11-16 DIAGNOSIS — I10 Essential (primary) hypertension: Secondary | ICD-10-CM | POA: Diagnosis not present

## 2017-11-16 DIAGNOSIS — I25110 Atherosclerotic heart disease of native coronary artery with unstable angina pectoris: Secondary | ICD-10-CM

## 2017-11-16 MED ORDER — METOPROLOL SUCCINATE ER 25 MG PO TB24: 25.0000 mg | ORAL_TABLET | Freq: Every day | ORAL | 3 refills | Status: DC

## 2017-11-16 NOTE — Patient Instructions (Addendum)
Medication Instructions:   Stop the amlodipine, take only as needed for high blood pressure  Restart the metoprolol succinate 25 mg daily If heart rate continues to run fast, increase up to 1 1/2 pills a day  Labwork:  No new labs needed  Testing/Procedures:  No further testing at this time   Follow-Up: It was a pleasure seeing you in the office today. Please call us if you have new issues that need to be addressed before your next appt.  [phone]  Your physician wants you to follow-up in: 12 months.  You will receive a reminder letter in the mail two months in advance. If you don't receive a letter, please call our office to schedule the follow-up appointment.  If you need a refill on your cardiac medications before your next appointment, please call your pharmacy.  For educational health videos Log in to : [email or web address] Or : [email or web address], password : [email or web address]

## 2017-11-21 DIAGNOSIS — H353221 Exudative age-related macular degeneration, left eye, with active choroidal neovascularization: Secondary | ICD-10-CM | POA: Diagnosis not present

## 2017-12-05 DIAGNOSIS — J449 Chronic obstructive pulmonary disease, unspecified: Secondary | ICD-10-CM | POA: Diagnosis not present

## 2017-12-12 ENCOUNTER — Other Ambulatory Visit: Payer: Self-pay | Admitting: Cardiovascular Disease

## 2017-12-12 ENCOUNTER — Encounter: Payer: Self-pay | Admitting: *Deleted

## 2017-12-12 ENCOUNTER — Other Ambulatory Visit: Payer: Self-pay | Admitting: *Deleted

## 2017-12-12 NOTE — Patient Outreach (Signed)
[place] [facility]) Care Management  12/12/2017  MAR WALMER 03-Feb-1951 [id number]   [place] Monthly Outreach  Referral Date:07/22/2017 Referral Source:Nurse Line Reason for Referral:Lump to left side of neck that has been there for 1 month comes and goes. Causing problems with swallowing Insurance:[employer]   Outreach Attempt:  Successful telephone outreach to patient for monthly follow up.  HIPAA verified with patient's wife per his request (hard of hearing).  Wife states patient continues to do ok.  Denies any falls. Reports initiation of Gabapentin has helped his back and leg pain some, but continues to feel like his legs are giving out on him, so he is limited in his activities.  Denies any current shortness of breath and reports continuing to use his oxygen at night.  Does still report some stuffiness from the oxygen use.  Encouraged patient and wife to contact [employer] about humidification on the oxygen.  Patient reporting he has not monitored his blood pressure or pulse over the last few days.  Reports he is using CBD oil and wife states the oil may be decreasing his appetite some.  Appointments:  Reports his follow up appointment with his primary care provider was rescheduled from June 25 to July 19.  Patient encouraged to keep and attend this appointment.  Patient recently seen Cardiologist on 11/16/2017 and Nephrologist on 11/14/2017.  Has scheduled follow up appointment with Pulmonologist on 01/11/2018.  Plan:  RN Health Coach will make next monthly outreach to patient in the month of August.   Augustus Zurawski RN [place] [phone] Janneth [email or web address]

## 2017-12-28 ENCOUNTER — Ambulatory Visit: Payer: PPO | Admitting: Urology

## 2017-12-30 DIAGNOSIS — Z9181 History of falling: Secondary | ICD-10-CM | POA: Diagnosis not present

## 2017-12-30 DIAGNOSIS — Z139 Encounter for screening, unspecified: Secondary | ICD-10-CM | POA: Diagnosis not present

## 2017-12-30 DIAGNOSIS — Z125 Encounter for screening for malignant neoplasm of prostate: Secondary | ICD-10-CM | POA: Diagnosis not present

## 2017-12-30 DIAGNOSIS — Z6830 Body mass index (BMI) 30.0-30.9, adult: Secondary | ICD-10-CM | POA: Diagnosis not present

## 2017-12-30 DIAGNOSIS — E669 Obesity, unspecified: Secondary | ICD-10-CM | POA: Diagnosis not present

## 2017-12-30 DIAGNOSIS — Z0000 Encounter for general adult medical examination without abnormal findings: Secondary | ICD-10-CM | POA: Diagnosis not present

## 2017-12-30 DIAGNOSIS — Z1331 Encounter for screening for depression: Secondary | ICD-10-CM | POA: Diagnosis not present

## 2017-12-30 DIAGNOSIS — Z1339 Encounter for screening examination for other mental health and behavioral disorders: Secondary | ICD-10-CM | POA: Diagnosis not present

## 2017-12-30 DIAGNOSIS — E785 Hyperlipidemia, unspecified: Secondary | ICD-10-CM | POA: Diagnosis not present

## 2017-12-30 DIAGNOSIS — Z136 Encounter for screening for cardiovascular disorders: Secondary | ICD-10-CM | POA: Diagnosis not present

## 2018-01-05 ENCOUNTER — Other Ambulatory Visit: Payer: Self-pay | Admitting: *Deleted

## 2018-01-05 DIAGNOSIS — J449 Chronic obstructive pulmonary disease, unspecified: Secondary | ICD-10-CM | POA: Diagnosis not present

## 2018-01-05 NOTE — Patient Outreach (Signed)
[place] [facility]) Care Management  01/05/2018  William Mueller 07-Aug-1950 [id number]   [place] Monthly Outreach  Referral Date:07/22/2017 Referral Source:Nurse Line Reason for Referral:Lump to left side of neck that has been there for 1 month comes and goes. Causing problems with swallowing Insurance:[employer]age   Outreach Attempt:  Outreach attempt #1 to patient for monthly follow up. No answer. RN Health Coach left HIPAA compliant voicemail message along with contact information.  Plan:  RN Health Coach will send unsuccessful outreach letter to patient.  RN Health Coach will make another outreach attempt to patient within 3-4 business days if no return call back from patient.  [place] [phone] [email or web address]

## 2018-01-09 DIAGNOSIS — I10 Essential (primary) hypertension: Secondary | ICD-10-CM | POA: Diagnosis not present

## 2018-01-09 DIAGNOSIS — I25119 Atherosclerotic heart disease of native coronary artery with unspecified angina pectoris: Secondary | ICD-10-CM | POA: Diagnosis not present

## 2018-01-09 DIAGNOSIS — H6122 Impacted cerumen, left ear: Secondary | ICD-10-CM | POA: Diagnosis not present

## 2018-01-09 DIAGNOSIS — Z125 Encounter for screening for malignant neoplasm of prostate: Secondary | ICD-10-CM | POA: Diagnosis not present

## 2018-01-09 DIAGNOSIS — Z79899 Other long term (current) drug therapy: Secondary | ICD-10-CM | POA: Diagnosis not present

## 2018-01-09 DIAGNOSIS — J449 Chronic obstructive pulmonary disease, unspecified: Secondary | ICD-10-CM | POA: Diagnosis not present

## 2018-01-09 DIAGNOSIS — F419 Anxiety disorder, unspecified: Secondary | ICD-10-CM | POA: Diagnosis not present

## 2018-01-09 DIAGNOSIS — Z6830 Body mass index (BMI) 30.0-30.9, adult: Secondary | ICD-10-CM | POA: Diagnosis not present

## 2018-01-09 DIAGNOSIS — E782 Mixed hyperlipidemia: Secondary | ICD-10-CM | POA: Diagnosis not present

## 2018-01-09 DIAGNOSIS — Z6829 Body mass index (BMI) 29.0-29.9, adult: Secondary | ICD-10-CM | POA: Diagnosis not present

## 2018-01-09 DIAGNOSIS — N183 Chronic kidney disease, stage 3 (moderate): Secondary | ICD-10-CM | POA: Diagnosis not present

## 2018-01-09 DIAGNOSIS — Z1339 Encounter for screening examination for other mental health and behavioral disorders: Secondary | ICD-10-CM | POA: Diagnosis not present

## 2018-01-09 DIAGNOSIS — M5416 Radiculopathy, lumbar region: Secondary | ICD-10-CM | POA: Diagnosis not present

## 2018-01-10 ENCOUNTER — Other Ambulatory Visit: Payer: Self-pay | Admitting: *Deleted

## 2018-01-10 NOTE — Patient Outreach (Signed)
[place] [facility]) Care Management  01/10/2018  PHENIX VANDERMEULEN 1950-07-04 [id number]   [place] Monthly Outreach  Referral Date:07/22/2017 Referral Source:Nurse Line Reason for Referral:Lump to left side of neck that has been there for 1 month comes and goes. Causing problems with swallowing Insurance:[employer]vantage   Outreach Attempt:  Outreach attempt #2 to patient for monthly follow up. No answer. RN Health Coach left HIPAA compliant voicemail message along with contact information.  Plan:   RN Health Coach will make another outreach attempt to patient within 6 business days if no return call back from patient.  [place] [phone] Eiden [email or web address]

## 2018-01-11 ENCOUNTER — Telehealth: Payer: Self-pay | Admitting: Cardiovascular Disease

## 2018-01-11 NOTE — Telephone Encounter (Signed)
S/w patient's wife, ok per DPR. Patient has been having chest pain some mornings when he wakes up.  It started in June when Dr Rockey Situ stopped his amlodipine.  It may last a few minutes. When it occurs, his left arm and some shortness of breath. It happened yesterday morning and was worse than normal.  Right now, he is fine.  Offered patient appointment with Dr Rockey Situ on 01/16/18 which he agreed to. Pt verbalized understanding to call 911 or go to the emergency room, if the symptoms return any time before the appointment and not to wait until the appointment to get evaluated.

## 2018-01-11 NOTE — Telephone Encounter (Signed)
Patient spouse calling stating since patient has been off his Amlodipine (June 12 last day on it) he's woken up with slight cp in. Not every morning but it happens enough to cause a concern   Would like a call back about this

## 2018-01-15 NOTE — Progress Notes (Signed)
Cardiology Office Note  Date:  01/16/2018   ID:  William Mueller, DOB [birth year], MRN [id number]  PCP:  Philmore Pali, NP   Chief Complaint  Patient presents with  . Other    Patient c.o Occ. Chest pain, SOB and chest pressure. patient states he has to take a nirto last night. Meds reivewed verbally with patient.    HPI:  William Mueller is a very pleasant 67 year old gentleman with  long history of smoking, COPD,  chronic renal insufficiency, CR 1.7 CAD  12/11/2013 to [facility] with chest pain cardiac catheterization lab : occluded mid RCA. Stent was placed.  Repeat catheterization in October 2016 with patent stent,disease of a small diagonal and distal circumflex not amenable to intervention.  paroxysmal atrial tachycardia He presents today for follow-up of his coronary artery disease And COPD   Chest pains, 3 to 4 weeks When he gets up, needs NTG More resting Than with exertion  Reports having fast heart rate at times Had Tachycardia 140 checking the tires He is not using his oxygen with exertion Seems to be off oxygen most of the time as pulse oximeter reads okay  Bad back pain, limiting his ability to exercise  Weight running high  Previous trip to the emergency room reviewed with him in detail again Had similar symptoms of tachycardia chest pain 09/2017 ER: Chest pain and orthostasis Echo showed grade 2 diastolic dysfunction with normal EF. Was given IV fluids  Off metoprolol, BP was low On lasix daily Restarted metoprolol 25 on last clinic visit Amlodipine held  Scheduled to see Dr. Raul Del later this week  EKG personally reviewed by myself on todays visit Normal sinus rhythm with rate 95 bpm ST and T wave abnormality in V4 through V6 1 and aVL  Other past medical history reviewed In the ER 03/09/2017 Feet swelling, tingling  Reports that he sits on the couch most of the day, no activity No regular exercise program given arthritic issues as above  hospital  admission  July 26 2016 discharged February 20  admitted for chest pain, atypical Had adjustments to his blood pressure medications Seen by cardiology in the hospital, felt it was atypical Possible transient angina in the setting of uncontrolled hypertension Beta blocker dose was increased  Seen in the emergency room 11/01/2016 for chest pain Atypical in nature  In the ER 02/2016 ABD pain. He still does not know the cause of his pain  Previously called EMS HR 147, on couch, did not feel right Sinus tach on EKG he provided to [place] today  Felt he was having a panic attack Talked with EMS and heart rate improved No further episodes since that time  He reports having periodic tachycardia when he wakes up in the morning with associated hypoxemia. Numbers on his pulse oximeter are in the 80s  does not use oxygen at nighttime to sleep  April 2017 went to the emergency room with chest tightness, kept in the hospital, refused stress test, had cardiac catheterization showing patent stent, disease was relatively unchanged Date of catheterization 09/29/2015 Echocardiogram confirmed ejection fraction 65%  Previously did a PFT, technician reported that he passed out. He does not remember any significant difficulty but the test was abruptly stopped  Unable to tolerate isosorbide secondary to headache previously total cholesterol was 200  he presented to the hospital 02/26/2014 with chest pain. He seen by cardiology, rule out for MI, had normal EKG and discharged home. Echocardiogram July 2015 showed ejection fraction  55-60%  Creatinine in 10/22/2013 was 2.1  PMH:   has a past medical history of Anginal pain ([place]) (06/2017), Anxiety, Bronchitis (06/2017), Chronic chest pain, Chronic kidney disease (CKD) stage G3b/A1, moderately decreased glomerular filtration rate (GFR) between 30-44 mL/min/1.73 square meter and albuminuria creatinine ratio less than 30 mg/g (HCC), Chronic lower back  pain, COPD (chronic obstructive pulmonary disease) ([place]), Coronary artery disease, Daily headache, Depression, Diastolic dysfunction, Dyspnea, GERD (gastroesophageal reflux disease), Hearing loss, High cholesterol, Hypertension, Myocardial infarction ([place]) (2015), Nicotine addiction, Orthopnea, and Sleep apnea.  PSH:    Past Surgical History:  Procedure Laterality Date  . CARDIAC CATHETERIZATION     MC x 1 stent  . CARDIAC CATHETERIZATION Left 03/12/2015   Procedure: Left Heart Cath and Coronary Angiography;  Surgeon: Leonie Man, MD;  Location: [facility];  Service: Cardiovascular;  Laterality: Left;  . CARDIAC CATHETERIZATION N/A 09/29/2015   Procedure: Left Heart Cath and Coronary Angiography;  Surgeon: Wellington Hampshire, MD;  Location: [facility];  Service: Cardiovascular;  Laterality: N/A;  . CATARACT EXTRACTION W/PHACO Right 08/10/2017   Procedure: CATARACT EXTRACTION PHACO AND INTRAOCULAR LENS PLACEMENT ([place]) RIGHT;  Surgeon: Leandrew Koyanagi, MD;  Location: [facility];  Service: Ophthalmology;  Laterality: Right;  . CORONARY ANGIOPLASTY     STENT PLACEMENT  . LEFT HEART CATHETERIZATION WITH CORONARY ANGIOGRAM N/A 12/12/2013   Procedure: LEFT HEART CATHETERIZATION WITH CORONARY ANGIOGRAM;  Surgeon: Wellington Hampshire, MD;  Location: [facility];  Service: Cardiovascular;  Laterality: N/A;    Current Outpatient Medications  Medication Sig Dispense Refill  . acetaminophen (TYLENOL) 500 MG tablet Take 1,000 mg by mouth daily as needed for headache.     . albuterol (PROVENTIL HFA;VENTOLIN HFA) 108 (90 Base) MCG/ACT inhaler Inhale into the lungs every 6 (six) hours as needed for wheezing or shortness of breath.    Marland Kitchen albuterol (PROVENTIL) (2.5 MG/3ML) 0.083% nebulizer solution Take 3 mLs (2.5 mg total) by nebulization every 6 (six) hours as needed for wheezing or shortness of breath. 30 vial 0  . ALPRAZolam (XANAX XR) 3 MG 24 hr tablet Take 1-3 mg by mouth 2 (two)  times daily. 1 MG IN THE MORNING AND 3 MG IN THE EVENING    . amLODipine (NORVASC) 5 MG tablet Take 1 tablet (5 mg total) by mouth daily as needed. 90 tablet 3  . aspirin 81 MG EC tablet Take 1 tablet (81 mg total) by mouth daily. (Patient taking differently: Take 81 mg by mouth daily. BREAKFAST) 90 tablet 3  . atorvastatin (LIPITOR) 80 MG tablet TAKE 1 TABLET BY MOUTH DAILY 90 tablet 3  . budesonide-formoterol (SYMBICORT) 160-4.5 MCG/ACT inhaler Inhale 2 puffs into the lungs 2 (two) times daily. (Patient taking differently: Inhale 2 puffs into the lungs 2 (two) times daily. BREAKFAST AND BEDTIME) 3 Inhaler 1  . cyclobenzaprine (FLEXERIL) 10 MG tablet Take 10 mg by mouth 3 (three) times daily as needed for muscle spasms.    Marland Kitchen ezetimibe (ZETIA) 10 MG tablet Take 1 tablet (10 mg total) by mouth daily. (Patient taking differently: Take 10 mg by mouth daily. DINNER) 90 tablet 3  . furosemide (LASIX) 20 MG tablet Take 20 mg by mouth daily.     Marland Kitchen gabapentin (NEURONTIN) 100 MG capsule Take 100 mg by mouth 2 (two) times daily. Take 2 pills twice a day    . metoprolol succinate (TOPROL-XL) 25 MG 24 hr tablet Take 1 tablet (25 mg total) by mouth  daily. 90 tablet 3  . NITROSTAT 0.4 MG SL tablet PLACE 1 TABLET UNDER TONGUE EVERY 5 MINUTES AS NEEDED FOR CHEST PAIN.CALL 911 AFTER 3RD TABLET. 25 tablet 1  . omeprazole (PRILOSEC) 40 MG capsule Take 1 capsule (40 mg total) by mouth daily. (Patient taking differently: Take 40 mg by mouth daily. AM) 90 capsule 1  . OXYGEN Inhale 2 L into the lungs daily as needed (oxygen).     . roflumilast (DALIRESP) 500 MCG TABS tablet Take 1 tablet (500 mcg total) by mouth daily. (Patient taking differently: Take 500 mcg by mouth daily. LUNCH) 90 tablet 1  . tamsulosin (FLOMAX) 0.4 MG CAPS capsule Take 1 capsule (0.4 mg total) by mouth daily. 30 capsule 11  . tiotropium (SPIRIVA) 18 MCG inhalation capsule Place 1 capsule (18 mcg total) into inhaler and inhale daily. (Patient taking  differently: Place 18 mcg into inhaler and inhale daily. AM) 90 capsule 1  . VENTOLIN HFA 108 (90 Base) MCG/ACT inhaler INHALE 1 TO 2 PUFFS INTO THE LUNGS EVERY 6 HOURS AS NEEDED FOR WHEEZING OR SHORTNESS OF BREATH. 54 g 0   No current facility-administered medications for this visit.      Allergies:   Paroxetine hcl; Serotonin reuptake inhibitors (ssris); Diazepam; Doxycycline monohydrate; Escitalopram oxalate; Lorazepam; and Tetracyclines & related   Social History:  The patient  reports that he has been smoking e-cigarettes. He has smoked for the past 48.00 years. He has never used smokeless tobacco. He reports that he drinks about 6.0 standard drinks of alcohol per week. He reports that he does not use drugs.   Family History:   family history includes Coronary artery disease in his brother; Heart disease in his father; Heart disease (age of onset: 69) in his brother.    Review of Systems: Review of Systems  Constitutional: Negative.   Respiratory: Positive for shortness of breath.   Cardiovascular: Positive for chest pain.  Gastrointestinal: Negative.   Musculoskeletal: Positive for back pain.       Left leg pain  Neurological: Negative.   Psychiatric/Behavioral: Negative.   All other systems reviewed and are negative.    PHYSICAL EXAM: VS:  BP 136/74 (BP Location: Left Arm, Patient Position: Sitting, Cuff Size: Large)   Pulse 95   Ht 5\' 9"  (1.753 m)   Wt 202 lb 8 oz (91.9 kg)   BMI 29.90 kg/m  , BMI Body mass index is 29.9 kg/m. Constitutional:  oriented to person, place, and time. No distress.  HENT:  Head: Normocephalic and atraumatic.  Eyes:  no discharge. No scleral icterus.  Neck: Normal range of motion. Neck supple. No JVD present.  Cardiovascular: Normal rate, regular rhythm, normal heart sounds and intact distal pulses. Exam reveals no gallop and no friction rub. No edema No murmur heard. Pulmonary/Chest: Effort normal and breath sounds normal. No stridor. No  respiratory distress.  no wheezes.  no rales.  no tenderness.  Abdominal: Soft.  no distension.  no tenderness.  Musculoskeletal: Normal range of motion.  no  tenderness or deformity.  Neurological:  normal muscle tone. Coordination normal. No atrophy Skin: Skin is warm and dry. No rash noted. not diaphoretic.  Psychiatric:  normal mood and affect. behavior is normal. Thought content normal.    Recent Labs: 03/09/2017: B Natriuretic Peptide 22.0 09/29/2017: Hemoglobin 13.8; Platelets 299 09/30/2017: BUN 11; Creatinine, Ser 1.52; Magnesium 2.2; Potassium 3.5; Sodium 138; TSH 0.885    Lipid Panel Lab Results  Component Value Date  CHOL 106 07/27/2016   HDL 40 (L) 07/27/2016   LDLCALC 42 07/27/2016   TRIG 118 07/27/2016      Wt Readings from Last 3 Encounters:  01/16/18 202 lb 8 oz (91.9 kg)  11/16/17 202 lb 12 oz (92 kg)  10/01/17 204 lb (92.5 kg)       ASSESSMENT AND PLAN:   Stable angina Recommended he wears oxygen all the time and increase up to 3-4 L on heavy exertion We will start isosorbide 30 mill grams daily Metoprolol 25 as he is taking now for tachycardia May need to increase up to metoprolol 50 daily if needed Heart rate likely driven by underlying COPD If he continues to have chest pain requiring nitroglycerin may need to do cardiac catheterization for unstable angina He will call us in the next several weeks with update on his symptoms  Essential hypertension - Plan: EKG 12-Lead Started on isosorbide 30 mg daily May need to increase metoprolol up to 50 for tachycardia  Mixed hyperlipidemia - Plan: EKG 12-Lead Cholesterol is at goal on the current lipid regimen. No changes to the medications were made. Stable  Atrial tachycardia, paroxysmal (HCC) - Plan: EKG 12-Lead Continues to complain of paroxysmal tachycardia Suspect this is brought on by exertion without his oxygen and underlying COPD May need to increase his metoprolol up to 50 daily Recommended  he wears oxygen more frequently especially with exertion  CAD S/P PCI DES to RCA - Plan: EKG 12-Lead  in the hospital for chest pain several months ago Workup negative,  Again with similar symptoms Often times at rest  Anxiety/stress Managed by primary care  Chronic back pain, leg pain Secondary to nerve compression   Total encounter time more than 45 minutes  Greater than 50% was spent in counseling and coordination of care with the patient  Disposition:   F/U  1-2 months   Orders Placed This Encounter  Procedures  . EKG 12-Lead     Signed, Esmond Plants, M.D., Ph.D. 01/16/2018  [place], [place]

## 2018-01-16 ENCOUNTER — Ambulatory Visit: Payer: PPO | Admitting: Cardiovascular Disease

## 2018-01-16 ENCOUNTER — Encounter: Payer: Self-pay | Admitting: Cardiovascular Disease

## 2018-01-16 VITALS — BP 136/74 | HR 95 | Ht 69.0 in | Wt 202.5 lb

## 2018-01-16 DIAGNOSIS — G8929 Other chronic pain: Secondary | ICD-10-CM

## 2018-01-16 DIAGNOSIS — I739 Peripheral vascular disease, unspecified: Secondary | ICD-10-CM | POA: Diagnosis not present

## 2018-01-16 DIAGNOSIS — I471 Supraventricular tachycardia: Secondary | ICD-10-CM

## 2018-01-16 DIAGNOSIS — I4719 Other supraventricular tachycardia: Secondary | ICD-10-CM

## 2018-01-16 DIAGNOSIS — E782 Mixed hyperlipidemia: Secondary | ICD-10-CM

## 2018-01-16 DIAGNOSIS — I25110 Atherosclerotic heart disease of native coronary artery with unstable angina pectoris: Secondary | ICD-10-CM

## 2018-01-16 DIAGNOSIS — H353211 Exudative age-related macular degeneration, right eye, with active choroidal neovascularization: Secondary | ICD-10-CM | POA: Diagnosis not present

## 2018-01-16 DIAGNOSIS — J432 Centrilobular emphysema: Secondary | ICD-10-CM

## 2018-01-16 DIAGNOSIS — I10 Essential (primary) hypertension: Secondary | ICD-10-CM | POA: Diagnosis not present

## 2018-01-16 DIAGNOSIS — R079 Chest pain, unspecified: Secondary | ICD-10-CM | POA: Diagnosis not present

## 2018-01-16 DIAGNOSIS — N183 Chronic kidney disease, stage 3 unspecified: Secondary | ICD-10-CM

## 2018-01-16 MED ORDER — ISOSORBIDE MONONITRATE ER 30 MG PO TB24: 30.0000 mg | ORAL_TABLET | Freq: Every day | ORAL | 3 refills | Status: DC

## 2018-01-16 MED ORDER — NITROGLYCERIN 0.4 MG SL SUBL: 0.4000 mg | SUBLINGUAL_TABLET | SUBLINGUAL | 1 refills | Status: DC | PRN

## 2018-01-16 NOTE — Patient Instructions (Addendum)
Try more oxygen, especially with exertion/walking  Medication Instructions:   Please try imdur/isosorbide one a day (long acting nitro)  Ok to take extra metoprolol as needed for tachycardia  Labwork:  No new labs needed  Testing/Procedures:  No further testing at this time   Follow-Up: It was a pleasure seeing you in the office today. Please call us if you have new issues that need to be addressed before your next appt.  [phone]  Your physician wants you to follow-up in: 1  month.    If you need a refill on your cardiac medications before your next appointment, please call your pharmacy.  For educational health videos Log in to : [email or web address] Or : [email or web address], password : [email or web address]

## 2018-01-18 ENCOUNTER — Other Ambulatory Visit: Payer: Self-pay | Admitting: *Deleted

## 2018-01-18 ENCOUNTER — Encounter: Payer: Self-pay | Admitting: *Deleted

## 2018-01-18 NOTE — Patient Outreach (Signed)
[facility]) Care Management  01/18/2018  William Mueller [birth year] [id number]   [place] Monthly Outreach  Referral Date:07/22/2017 Referral Source:Nurse Line Reason for Referral:Lump to left side of neck that has been there for 1 month comes and goes. Causing problems with swallowing Insurance:[employer]   Outreach Attempt:  Successful telephone outreach to patient for monthly follow up.  HIPAA verified with patient's wife, [name] per patient request due to difficulties hearing.  Patient stating he is doing a lot better.  He was having increased palpitations with heart rates in the 140's, shortness of breath and chest pain in the mornings.  Wife reports patient saw his Cardiologist this week and was told to wear his oxygen at all times no matter what his home pulse oximetry was reading and to increase liters to 3-4, especially with activity; and initiated on Imdur.  Wife states patient has been wearing his oxygen at all times at the increased flow rate and his palpitations, shortness of breath, and chest pains have been resolved.  Patient is reporting a headache after the initiation of the Imdur.  Encouraged patient to discuss timing of medication and headache with pharmacist.  Wife is also reporting patient's home portable oxygen tank battery only last about 30 minutes.  Reports she has notified [facility] and they can not offer any assistance.  Wife states she has contacted [employer] and is looking into changing DME agencies to [employer].  Patient reporting not having to use his rescue inhaler in the last week since the increase in oxygen.  Does report having eye injection on 8/13/219 with some pain due to not enough numbing medication, but stating pain is much better now.  Appointments:  Reports seeing primary care provider, NP Lam on 01/16/2018 and will have follow up appointment in 6 months around 07/2018.  Reports having a  pulmonologist appointment scheduled for 01/20/2018.  Plan: RN Health Coach will make next monthly outreach to patient in the month of September.  [place] [phone] [email or web address]

## 2018-01-20 ENCOUNTER — Other Ambulatory Visit: Payer: Self-pay | Admitting: *Deleted

## 2018-01-20 DIAGNOSIS — R0609 Other forms of dyspnea: Secondary | ICD-10-CM | POA: Diagnosis not present

## 2018-01-20 DIAGNOSIS — J439 Emphysema, unspecified: Secondary | ICD-10-CM | POA: Diagnosis not present

## 2018-01-20 DIAGNOSIS — Z9981 Dependence on supplemental oxygen: Secondary | ICD-10-CM | POA: Diagnosis not present

## 2018-01-20 NOTE — Patient Outreach (Addendum)
[facility]) Care Management  01/20/2018  William Mueller [birth year] [id number]   [place] Coordination/Oxygen DME  Referral Date:07/22/2017 Referral Source:Nurse Line Reason for Referral:Lump to left side of neck that has been there for 1 month comes and goes. Causing problems with swallowing Insurance:[employer]  Addendum:  Successful telephone outreach to patient to follow up after pulmonologist appointment.  HIPAA verified with patient.  Patient states he did speak with the pulmonologist concerning his oxygen DME issues.  States physician did hear the noise and feel how hot the portable oxygen patient is using got; patient states he thinks pulmonologist is going to give [facility] a call concerning the current portable oxygen concentrator patient is using.  Patient also states pulmonologist is requesting him to change from an intermittent portable concentrator to a continuous portable oxygen concentrator and he will contact [name].  Patient currently awaiting a telephone call back from [name] from message left today.  RN Health Coach will outreach to patient within the next 10 business days to follow up on equipment.   Outreach Attempt:  Received incoming telephone call from patient.  HIPAA verified with patient.  Patient stating he received call from someone with [name] and they informed him he could not switch his oxygen prescription over to them from [name].  States he was unclear of the reason the person gave and requested RN Health Coach to follow up with the Agency.  RN Health Coach outreached to [name] [phone]) in [place] and spoke with [name].  [name] stated that patient could not change to their agency because patient was past the billing month ([name] would only allow the transition of DME oxygen if he was 6 months of less using home oxygen).  She clarified that typically agencies only  charge for home oxygen 36 months and the patient had already had oxygen with [facility] for 13 months.  RN Health Coach was able to leave message for the Operations Manager to return call on Monday to see if there was any way patient could change agencies.  RN Health Coach then outreached to [place] in [place] [phone]) and spoke with [name].  Expressed patient's concerns to [name] of his portable oxygen tank only lasting 30 minutes, becoming hot and making load noise.  [name] states they have not heard these concerns before and that patient has PAC (portable oxygen concentrator) that he had sent in for repairs in June and returned to him in July; Chemical engineer will give patient a call and go out if need be.  Patient was to attend scheduled Pulmonary Appointment today and discuss oxygen needs with pulmonologist.  Plan:  [place] will make outreach to patient and update him and wife on DME agencies recommendations/suggestions.  [place] [phone] [email or web address]

## 2018-01-31 ENCOUNTER — Other Ambulatory Visit: Payer: Self-pay | Admitting: *Deleted

## 2018-01-31 NOTE — Patient Outreach (Addendum)
[facility]) Care Management  01/31/2018  FIELDS OROS 12/07/1950 [id number]   [place] Coordination/Oxygen DME  Referral Date:07/22/2017 Referral Source:Nurse Line Reason for Referral:Lump to left side of neck that has been there for 1 month comes and goes. Causing problems with swallowing Insurance:[employer]   Addendum:  Successful telephone outreach to patient for update on oxygen concentrators.  HIPAA verified with patient.  States wife is not available and is at work.  Patient updated on the information below about the portable oxygen concentrators and why the battery life, loudness, and running hot is happening with the increase in oxygen flow.  Also updated on the fact there is no portable oxygen concentrator with normal liter flow of 4L on the market at this time per [place] and [employer].  Patient stated his understanding and still inquiring about extended battery for his Caire portable oxygen concentrator.  Encouraged patient to contact [place] to see if extended battery is covered.  Also updated patient on [place] still awaiting telephone call back from Dr. Gust Brooms office concerning liter flow and type of oxygen (pulse or continuous) he is recommending for patient.   Outreach Attempt:  Received incoming call from patient and wife.  Spoke with wife per patient request and HIPAA verified with wife.  Patient and wife reporting they are having trouble receiving new portable oxygen concentrator from DME [facility].  States their Pulmonologist has placed an order for them to receive an Oxygo (Portable Oxygen Concentrator) and they currently have a Caire (Portable Oxygen Concentrator).  Their current portable concentrator battery life is still 1 1/2 hours and is loud and over heats.  Patient reporting utilizing the concentrator at 4 liters as recommended by his Cardiologist and feels better at this liter flow.   Reports [facility] has not come out to home to assess his current concentrator, but they have spoke with them and was told "insurance would not cover the change over to the new concentrator, Oxygo".  Continues to report Pulmonologist was requesting possible change to continuous flow oxygen concentrator.  Patient and wife requesting assistance with DME.  RN Health Coach outreached to Dr. Gust Brooms office to confirm new prescription.  Spoke with Caryl Pina, whom states prescription was faxed to [facility] on 01/25/2018 for [facility].  Message left for Dr. Gust Brooms Nurse to return call to verify patient's oxygen need.  RN Health Coach outreach to [place].  Spoke with Estill Bamberg whom confirms they did receive order for Oxygo portable oxygen concentrator.  Estill Bamberg states she spoke with patient and wife yesterday to inform them that the Oxygo is the same type portable oxygen concentrator as they currently have, just a different manufacturer, and that the insurance would not cover changing to a new system without a valid reason.  Estill Bamberg states the Threasa Beards is a pulse oxygen concentrator and so is the [employer] (is a pulse portable oxygen concentrator).  She states the current portable oxygen concentrator patient is using is working properly, but battery life, becoming hot, and loudness is related to patient using the portable oxygen concentrator at 4 liters instead of the 2 liters the machine is prescribed for.  Verifies the same thing will happen with the Oxygo if the patient was to use at 4 Liters, machine battery would not last long, machine would be loud, and machine would become hot.  Estill Bamberg also states there is not a portable oxygen concentrator that provides 4 liters on the market (pulse or  continuous).  She does state the patient was offered to go back to oxygen tanks to provide the 4 liter flow he is requiring.  Does states there is available the Simplygo continuous oxygen concentrator that provides 2 liters flow  safely.  Also states patient can purchase portable oxygen concentrator independently and they would then provide oxygen tanks for patient to use and have both options available at home.  Verbalizes patient may also purchase an extended battery for portable concentrator.  RN Health coach outreached to [place], Respiratory Therapist with [employer].  She confirms that if a patient is using the "Portable Oxygen Concentrator" at 4 liter flow it will cause the machine's motor to work harder making it loud, hot, and battery life to be short.  Also confirms that the Oxygo and the [place] are both the same meters from different manufacturers and should work the same.  Mariann Laster also confirms there are no portable oxygen concentrators on the market to support 4 liters flow and the patient's best option would be to utilize a oxygen tank.  Plan:  RN health Coach will await call back from Dr. Gust Brooms office to verify patient's oxygen liter flow need and oxygen machine need (pulse versus continuous) prior to return call back to patient.  [place] [phone] [email or web address]

## 2018-02-05 DIAGNOSIS — J449 Chronic obstructive pulmonary disease, unspecified: Secondary | ICD-10-CM | POA: Diagnosis not present

## 2018-02-09 DIAGNOSIS — R0609 Other forms of dyspnea: Secondary | ICD-10-CM | POA: Diagnosis not present

## 2018-02-09 DIAGNOSIS — G4710 Hypersomnia, unspecified: Secondary | ICD-10-CM | POA: Diagnosis not present

## 2018-02-12 DIAGNOSIS — R069 Unspecified abnormalities of breathing: Secondary | ICD-10-CM | POA: Diagnosis not present

## 2018-02-19 NOTE — Progress Notes (Deleted)
Cardiology Office Note  Date:  02/19/2018   ID:  William Mueller, DOB [birth year], MRN [id number]  PCP:  Philmore Pali, NP   No chief complaint on file.   HPI:  William Mueller is a very pleasant 67 year old gentleman with  long history of smoking, COPD,  chronic renal insufficiency, CR 1.7 CAD  12/11/2013 to [facility] with chest pain cardiac catheterization lab : occluded mid RCA. Stent was placed.  Repeat catheterization in October 2016 with patent stent,disease of a small diagonal and distal circumflex not amenable to intervention.  paroxysmal atrial tachycardia He presents today for follow-up of his coronary artery disease And COPD   Chest pains, 3 to 4 weeks When he gets up, needs NTG More resting Than with exertion  Reports having fast heart rate at times Had Tachycardia 140 checking the tires He is not using his oxygen with exertion Seems to be off oxygen most of the time as pulse oximeter reads okay  Bad back pain, limiting his ability to exercise  Weight running high  Previous trip to the emergency room reviewed with him in detail again Had similar symptoms of tachycardia chest pain 09/2017 ER: Chest pain and orthostasis Echo showed grade 2 diastolic dysfunction with normal EF. Was given IV fluids  Off metoprolol, BP was low On lasix daily Restarted metoprolol 25 on last clinic visit Amlodipine held  Scheduled to see Dr. Raul Del later this week  EKG personally reviewed by myself on todays visit Normal sinus rhythm with rate 95 bpm ST and T wave abnormality in V4 through V6 1 and aVL  Other past medical history reviewed In the ER 03/09/2017 Feet swelling, tingling  Reports that he sits on the couch most of the day, no activity No regular exercise program given arthritic issues as above  hospital admission  July 26 2016 discharged February 20  admitted for chest pain, atypical Had adjustments to his blood pressure medications Seen by cardiology in the  hospital, felt it was atypical Possible transient angina in the setting of uncontrolled hypertension Beta blocker dose was increased  Seen in the emergency room 11/01/2016 for chest pain Atypical in nature  In the ER 02/2016 ABD pain. He still does not know the cause of his pain  Previously called EMS HR 147, on couch, did not feel right Sinus tach on EKG he provided to [place] today  Felt he was having a panic attack Talked with EMS and heart rate improved No further episodes since that time  He reports having periodic tachycardia when he wakes up in the morning with associated hypoxemia. Numbers on his pulse oximeter are in the 80s  does not use oxygen at nighttime to sleep  April 2017 went to the emergency room with chest tightness, kept in the hospital, refused stress test, had cardiac catheterization showing patent stent, disease was relatively unchanged Date of catheterization 09/29/2015 Echocardiogram confirmed ejection fraction 65%  Previously did a PFT, technician reported that he passed out. He does not remember any significant difficulty but the test was abruptly stopped  Unable to tolerate isosorbide secondary to headache previously total cholesterol was 200  he presented to the hospital 02/26/2014 with chest pain. He seen by cardiology, rule out for MI, had normal EKG and discharged home. Echocardiogram July 2015 showed ejection fraction 55-60%  Creatinine in 10/22/2013 was 2.1  PMH:   has a past medical history of Anginal pain ([place]) (06/2017), Anxiety, Bronchitis (06/2017), Chronic chest pain, Chronic kidney disease (CKD) stage  G3b/A1, moderately decreased glomerular filtration rate (GFR) between 30-44 mL/min/1.73 square meter and albuminuria creatinine ratio less than 30 mg/g (HCC), Chronic lower back pain, COPD (chronic obstructive pulmonary disease) ([place]), Coronary artery disease, Daily headache, Depression, Diastolic dysfunction, Dyspnea, GERD (gastroesophageal  reflux disease), Hearing loss, High cholesterol, Hypertension, Myocardial infarction ([place]) (2015), Nicotine addiction, Orthopnea, and Sleep apnea.  PSH:    Past Surgical History:  Procedure Laterality Date  . CARDIAC CATHETERIZATION     MC x 1 stent  . CARDIAC CATHETERIZATION Left 03/12/2015   Procedure: Left Heart Cath and Coronary Angiography;  Surgeon: Leonie Man, MD;  Location: [facility];  Service: Cardiovascular;  Laterality: Left;  . CARDIAC CATHETERIZATION N/A 09/29/2015   Procedure: Left Heart Cath and Coronary Angiography;  Surgeon: Wellington Hampshire, MD;  Location: [facility];  Service: Cardiovascular;  Laterality: N/A;  . CATARACT EXTRACTION W/PHACO Right 08/10/2017   Procedure: CATARACT EXTRACTION PHACO AND INTRAOCULAR LENS PLACEMENT ([place]) RIGHT;  Surgeon: Leandrew Koyanagi, MD;  Location: [facility];  Service: Ophthalmology;  Laterality: Right;  . CORONARY ANGIOPLASTY     STENT PLACEMENT  . LEFT HEART CATHETERIZATION WITH CORONARY ANGIOGRAM N/A 12/12/2013   Procedure: LEFT HEART CATHETERIZATION WITH CORONARY ANGIOGRAM;  Surgeon: Wellington Hampshire, MD;  Location: [facility];  Service: Cardiovascular;  Laterality: N/A;    Current Outpatient Medications  Medication Sig Dispense Refill  . acetaminophen (TYLENOL) 500 MG tablet Take 1,000 mg by mouth daily as needed for headache.     . albuterol (PROVENTIL HFA;VENTOLIN HFA) 108 (90 Base) MCG/ACT inhaler Inhale into the lungs every 6 (six) hours as needed for wheezing or shortness of breath.    Marland Kitchen albuterol (PROVENTIL) (2.5 MG/3ML) 0.083% nebulizer solution Take 3 mLs (2.5 mg total) by nebulization every 6 (six) hours as needed for wheezing or shortness of breath. 30 vial 0  . ALPRAZolam (XANAX XR) 3 MG 24 hr tablet Take 1-3 mg by mouth 2 (two) times daily. 1 MG IN THE MORNING AND 3 MG IN THE EVENING    . aspirin 81 MG EC tablet Take 1 tablet (81 mg total) by mouth daily. (Patient taking differently: Take  81 mg by mouth daily. BREAKFAST) 90 tablet 3  . atorvastatin (LIPITOR) 80 MG tablet TAKE 1 TABLET BY MOUTH DAILY 90 tablet 3  . budesonide-formoterol (SYMBICORT) 160-4.5 MCG/ACT inhaler Inhale 2 puffs into the lungs 2 (two) times daily. (Patient taking differently: Inhale 2 puffs into the lungs 2 (two) times daily. BREAKFAST AND BEDTIME) 3 Inhaler 1  . cyclobenzaprine (FLEXERIL) 10 MG tablet Take 10 mg by mouth 3 (three) times daily as needed for muscle spasms.    Marland Kitchen ezetimibe (ZETIA) 10 MG tablet Take 1 tablet (10 mg total) by mouth daily. (Patient taking differently: Take 10 mg by mouth daily. DINNER) 90 tablet 3  . furosemide (LASIX) 20 MG tablet Take 20 mg by mouth daily.     Marland Kitchen gabapentin (NEURONTIN) 100 MG capsule Take 100 mg by mouth 2 (two) times daily. Take 2 pills twice a day    . isosorbide mononitrate (IMDUR) 30 MG 24 hr tablet Take 1 tablet (30 mg total) by mouth daily. 90 tablet 3  . metoprolol succinate (TOPROL-XL) 25 MG 24 hr tablet Take 1 tablet (25 mg total) by mouth daily. 90 tablet 3  . nitroGLYCERIN (NITROSTAT) 0.4 MG SL tablet Place 1 tablet (0.4 mg total) under the tongue every 5 (five) minutes as needed for chest pain. 25  tablet 1  . omeprazole (PRILOSEC) 40 MG capsule Take 1 capsule (40 mg total) by mouth daily. (Patient taking differently: Take 40 mg by mouth daily. AM) 90 capsule 1  . OXYGEN Inhale 2 L into the lungs daily as needed (oxygen).     . roflumilast (DALIRESP) 500 MCG TABS tablet Take 1 tablet (500 mcg total) by mouth daily. (Patient taking differently: Take 500 mcg by mouth daily. LUNCH) 90 tablet 1  . tamsulosin (FLOMAX) 0.4 MG CAPS capsule Take 1 capsule (0.4 mg total) by mouth daily. 30 capsule 11  . tiotropium (SPIRIVA) 18 MCG inhalation capsule Place 1 capsule (18 mcg total) into inhaler and inhale daily. (Patient taking differently: Place 18 mcg into inhaler and inhale daily. AM) 90 capsule 1  . VENTOLIN HFA 108 (90 Base) MCG/ACT inhaler INHALE 1 TO 2 PUFFS  INTO THE LUNGS EVERY 6 HOURS AS NEEDED FOR WHEEZING OR SHORTNESS OF BREATH. 54 g 0   No current facility-administered medications for this visit.      Allergies:   Paroxetine hcl; Serotonin reuptake inhibitors (ssris); Diazepam; Doxycycline monohydrate; Escitalopram oxalate; Lorazepam; and Tetracyclines & related   Social History:  The patient  reports that he has been smoking e-cigarettes. He has smoked for the past 48.00 years. He has never used smokeless tobacco. He reports that he drinks about 6.0 standard drinks of alcohol per week. He reports that he does not use drugs.   Family History:   family history includes Coronary artery disease in his brother; Heart disease in his father; Heart disease (age of onset: 13) in his brother.    Review of Systems: Review of Systems  Constitutional: Negative.   Respiratory: Positive for shortness of breath.   Cardiovascular: Positive for chest pain.  Gastrointestinal: Negative.   Musculoskeletal: Positive for back pain.       Left leg pain  Neurological: Negative.   Psychiatric/Behavioral: Negative.   All other systems reviewed and are negative.    PHYSICAL EXAM: VS:  There were no vitals taken for this visit. , BMI There is no height or weight on file to calculate BMI. Constitutional:  oriented to person, place, and time. No distress.  HENT:  Head: Normocephalic and atraumatic.  Eyes:  no discharge. No scleral icterus.  Neck: Normal range of motion. Neck supple. No JVD present.  Cardiovascular: Normal rate, regular rhythm, normal heart sounds and intact distal pulses. Exam reveals no gallop and no friction rub. No edema No murmur heard. Pulmonary/Chest: Effort normal and breath sounds normal. No stridor. No respiratory distress.  no wheezes.  no rales.  no tenderness.  Abdominal: Soft.  no distension.  no tenderness.  Musculoskeletal: Normal range of motion.  no  tenderness or deformity.  Neurological:  normal muscle tone. Coordination  normal. No atrophy Skin: Skin is warm and dry. No rash noted. not diaphoretic.  Psychiatric:  normal mood and affect. behavior is normal. Thought content normal.    Recent Labs: 03/09/2017: B Natriuretic Peptide 22.0 09/29/2017: Hemoglobin 13.8; Platelets 299 09/30/2017: BUN 11; Creatinine, Ser 1.52; Magnesium 2.2; Potassium 3.5; Sodium 138; TSH 0.885    Lipid Panel Lab Results  Component Value Date   CHOL 106 07/27/2016   HDL 40 (L) 07/27/2016   LDLCALC 42 07/27/2016   TRIG 118 07/27/2016      Wt Readings from Last 3 Encounters:  01/16/18 202 lb 8 oz (91.9 kg)  11/16/17 202 lb 12 oz (92 kg)  10/01/17 204 lb (92.5 kg)  ASSESSMENT AND PLAN:   Stable angina Recommended he wears oxygen all the time and increase up to 3-4 L on heavy exertion We will start isosorbide 30 mill grams daily Metoprolol 25 as he is taking now for tachycardia May need to increase up to metoprolol 50 daily if needed Heart rate likely driven by underlying COPD If he continues to have chest pain requiring nitroglycerin may need to do cardiac catheterization for unstable angina He will call us in the next several weeks with update on his symptoms  Essential hypertension - Plan: EKG 12-Lead Started on isosorbide 30 mg daily May need to increase metoprolol up to 50 for tachycardia  Mixed hyperlipidemia - Plan: EKG 12-Lead Cholesterol is at goal on the current lipid regimen. No changes to the medications were made. Stable  Atrial tachycardia, paroxysmal (HCC) - Plan: EKG 12-Lead Continues to complain of paroxysmal tachycardia Suspect this is brought on by exertion without his oxygen and underlying COPD May need to increase his metoprolol up to 50 daily Recommended he wears oxygen more frequently especially with exertion  CAD S/P PCI DES to RCA - Plan: EKG 12-Lead  in the hospital for chest pain several months ago Workup negative,  Again with similar symptoms Often times at  rest  Anxiety/stress Managed by primary care  Chronic back pain, leg pain Secondary to nerve compression   Total encounter time more than 45 minutes  Greater than 50% was spent in counseling and coordination of care with the patient  Disposition:   F/U  1-2 months   No orders of the defined types were placed in this encounter.    Signed, Esmond Plants, M.D., Ph.D. 02/19/2018  [place], [place]

## 2018-02-20 ENCOUNTER — Ambulatory Visit: Payer: PPO | Admitting: Cardiovascular Disease

## 2018-02-24 ENCOUNTER — Encounter: Payer: Self-pay | Admitting: *Deleted

## 2018-02-24 ENCOUNTER — Other Ambulatory Visit: Payer: Self-pay | Admitting: *Deleted

## 2018-02-24 NOTE — Patient Outreach (Signed)
[facility]) Care Management  [place]  02/24/2018   William Mueller 1951/04/24 [id number]   [place] Monthly Outreach   Referral Date:  07/22/2017 Referral Source:  Nurse Line Reason for Referral:  Lump to left side of neck that has been there for 1 month comes and goes.  Causing problems with swallowing Insurance:  [employer]    Outreach Attempt:  Successful telephone outreach to patient and wife for monthly follow up.  HIPAA verified with wife.  Wife reports patient is doing well.  Is wearing his oxygen continuous at 3 liters/min.  States he has been less short of breath and has not had to use his rescue inhaler in the last week.  Recently completed an at home sleep study and is awaiting results from his pulmonologist.  Denies any chest pain or recent palpitations.  Encounter Medications:  Outpatient Encounter Medications as of 02/24/2018  Medication Sig Note  . acetaminophen (TYLENOL) 500 MG tablet Take 1,000 mg by mouth daily as needed for headache.    . albuterol (PROVENTIL HFA;VENTOLIN HFA) 108 (90 Base) MCG/ACT inhaler Inhale into the lungs every 6 (six) hours as needed for wheezing or shortness of breath.   Marland Kitchen albuterol (PROVENTIL) (2.5 MG/3ML) 0.083% nebulizer solution Take 3 mLs (2.5 mg total) by nebulization every 6 (six) hours as needed for wheezing or shortness of breath.   . ALPRAZolam (XANAX XR) 3 MG 24 hr tablet Take 1-3 mg by mouth 2 (two) times daily. 1 MG IN THE MORNING AND 3 MG IN THE EVENING 09/28/2017: Patient and wife reports taking 1 mg in am and 3 mg in the evening  . aspirin 81 MG EC tablet Take 1 tablet (81 mg total) by mouth daily. (Patient taking differently: Take 81 mg by mouth daily. BREAKFAST)   . atorvastatin (LIPITOR) 80 MG tablet TAKE 1 TABLET BY MOUTH DAILY   . budesonide-formoterol (SYMBICORT) 160-4.5 MCG/ACT inhaler Inhale 2 puffs into the lungs 2 (two) times daily. (Patient taking differently: Inhale 2 puffs  into the lungs 2 (two) times daily. BREAKFAST AND BEDTIME)   . ezetimibe (ZETIA) 10 MG tablet Take 1 tablet (10 mg total) by mouth daily. (Patient taking differently: Take 10 mg by mouth daily. DINNER)   . furosemide (LASIX) 20 MG tablet Take 20 mg by mouth daily.    Marland Kitchen gabapentin (NEURONTIN) 100 MG capsule Take 100 mg by mouth 2 (two) times daily. Take 2 pills twice a day   . isosorbide mononitrate (IMDUR) 30 MG 24 hr tablet Take 1 tablet (30 mg total) by mouth daily.   . metoprolol succinate (TOPROL-XL) 25 MG 24 hr tablet Take 1 tablet (25 mg total) by mouth daily.   . nitroGLYCERIN (NITROSTAT) 0.4 MG SL tablet Place 1 tablet (0.4 mg total) under the tongue every 5 (five) minutes as needed for chest pain.   Marland Kitchen omeprazole (PRILOSEC) 40 MG capsule Take 1 capsule (40 mg total) by mouth daily. (Patient taking differently: Take 40 mg by mouth daily. AM)   . OXYGEN Inhale 2 L into the lungs daily as needed (oxygen).  01/18/2018: Wife reports patient using 3-4 liters at all times per recommendations from Cardiologist  . roflumilast (DALIRESP) 500 MCG TABS tablet Take 1 tablet (500 mcg total) by mouth daily. (Patient taking differently: Take 500 mcg by mouth daily. LUNCH)   . tiotropium (SPIRIVA) 18 MCG inhalation capsule Place 1 capsule (18 mcg total) into inhaler and inhale daily. (Patient taking differently:  Place 18 mcg into inhaler and inhale daily. AM)   . VENTOLIN HFA 108 (90 Base) MCG/ACT inhaler INHALE 1 TO 2 PUFFS INTO THE LUNGS EVERY 6 HOURS AS NEEDED FOR WHEEZING OR SHORTNESS OF BREATH.   . cyclobenzaprine (FLEXERIL) 10 MG tablet Take 10 mg by mouth 3 (three) times daily as needed for muscle spasms. 11/07/2017: Wife reports patient is not taking  . tamsulosin (FLOMAX) 0.4 MG CAPS capsule Take 1 capsule (0.4 mg total) by mouth daily. (Patient not taking: Reported on 02/24/2018) 11/07/2017: Wife reports patient not taking any longer   No facility-administered encounter medications on file as of 02/24/2018.      Functional Status:  In your present state of health, do you have any difficulty performing the following activities: 09/30/2017 08/10/2017  Hearing? N N  Comment - -  Vision? N N  Comment - -  Difficulty concentrating or making decisions? N N  Walking or climbing stairs? Y N  Dressing or bathing? N N  Doing errands, shopping? N -  Preparing Food and eating ? - -  Using the Toilet? - -  In the past six months, have you accidently leaked urine? - -  Do you have problems with loss of bowel control? - -  Managing your Medications? - -  Managing your Finances? - -  Housekeeping or managing your Housekeeping? - -  Some recent data might be hidden    Fall/Depression Screening: Fall Risk  02/24/2018 12/12/2017 09/28/2017  Falls in the past year? No No No  Risk for fall due to : - Impaired balance/gait;Medication side effect -   PHQ 2/9 Scores 08/03/2017 07/22/2017  PHQ - 2 Score 0 2  PHQ- 9 Score - 14   THN CM Care Plan Problem One     Most Recent Value  Care Plan Problem One  Knowledge defeciet related to self care management of COPD and lower extremity weakness  Role Documenting the Problem One  [place] for Problem One  Active  [facility] Long Term Goal   Patient will report no emergency room visits or hospitalizations in the next 90 days.  THN Long Term Goal Start Date  02/24/18  THN Long Term Goal Met Date  02/24/18  Interventions for Problem One Long Term Goal  Reviewed and discussed current care plan and goals, encouraged to keep and attend medical appointments, reviewed medications and encouraged compliance, fall precautions and preventions discussed, encouraged patient to wear oxygen at all times to prevent shortness of breath  THN CM Short Term Goal #1   Patient will contact [facility] concerning extended oxygen battery in the next 60 days.  THN CM Short Term Goal #1 Start Date  02/24/18  Interventions for Short Term Goal #1  Discussed with patient and wife the portable  oxygen options and instructions from both DME agencies, encouraged patient and wife to contact insurance company to verify if the extended battery is covered versus them paying out of pocket,  dicussed extended battery increasing patient's ability to be more active and socialble  THN CM Short Term Goal #2   Patient will report discussing oxygen need with pulmonologist in the next 60 days.  THN CM Short Term Goal #2 Start Date  02/24/18  Interventions for Short Term Goal #2  Discussed with patient per DME companies portable oxygen concentrator's should be set at 2 liters,  encouraged patient and wife to discuss oxygen need with pulmonologist and cardiologist suggestions of wearing 4 liters at  all times to help with palpitations, also encouraged patient to discuss with pulmonologist continuous oxygen versus intermitent oxygen devices, encouraged patient and wife to contact pulmonologist concerning recent sleep study results     Appointments:   Has scheduled appointments with Dr. Candis Musa with Cardiology on 03/09/2018 and Dr. Raul Del with Pulmonology on 04/28/2018.  Rosezena Sensor he sees his primary care provider in February 2020.  Plan: RN Health Coach will send primary care provider Quarterly Update. RN Health Coach will make next telephone outreach to patient in the month of November.  [place] [phone] [name]

## 2018-03-07 DIAGNOSIS — J449 Chronic obstructive pulmonary disease, unspecified: Secondary | ICD-10-CM | POA: Diagnosis not present

## 2018-03-13 NOTE — Progress Notes (Signed)
Cardiology Office Note  Date:  03/14/2018   ID:  JAGDEEP ANCHETA, DOB 04-23-1951, MRN [id number]  PCP:  Philmore Pali, NP   Chief Complaint  Patient presents with  . OTHER    1 month f/u c/o chest pain, elevated BP and sob. Meds reviewed verbally with pt.    HPI:  [name] is a very pleasant 67 year old gentleman with  long history of smoking, COPD,  Uses ecig chronic renal insufficiency, CR 1.7 CAD  12/11/2013 to [facility] with chest pain cardiac catheterization lab : occluded mid RCA. Stent was placed.  Repeat catheterization in October 2016 with patent stent,disease of a small diagonal and distal circumflex not amenable to intervention.  paroxysmal atrial tachycardia Cardiac catheterization April 2017 He presents today for follow-up of his coronary artery disease And COPD   Last cardiac catheterization April 2017  Mid Cx to [facility] ([place]) Cx lesion, 90% stenosed.  1st Diag lesion, 80% stenosed.  Prox RCA lesion, 55% stenosed. The lesion was not previously treated.  Prox LAD to Mid LAD lesion, 40% stenosed.   1. Widely patent mid RCA stent. Stable proximal moderate RCA stenosis not significant by pressure wire interrogation. Significant disease affecting a small first diagonal and the AV groove branch of the left circumflex. Both of these are unchanged from before. There is mild to moderate calcified proximal to mid LAD disease which is also unchanged.  2. Mild to moderate elevation of left ventricular end-diastolic pressure.  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 Heart rate up to 130-140 bpm did not have pain  One episode high blood pressure He took hydralazine did not seem to work after narrow then took extra metoprolol Blood pressure came down and next morning was low Had  to eat some salt to get blood pressure back up  Bad back pain, limiting his ability to exercise Weight continues to run high  EKG personally reviewed by myself on todays visit Shows normal sinus rhythm rate 92 bpm nonspecific T wave abnormality anterolateral leads  Lab work reviewed with him showing total cholesterol 125 LDL 59 creatinine 1.73 normal electrolytes and LFTs  Previous trip to the emergency room Had similar symptoms of tachycardia chest pain 09/2017 ER: Chest pain and orthostasis Echo showed grade 2 diastolic dysfunction with normal EF. Was given IV fluids  In the ER 03/09/2017 Feet swelling, tingling  Reports that he sits on the couch most of the day, no activity No regular exercise program given arthritic issues as above  hospital admission  July 26 2016 discharged February 20  admitted for chest pain, atypical Had adjustments to his blood pressure medications Seen by cardiology in the hospital, felt it was atypical Possible transient angina in the setting of uncontrolled hypertension Beta blocker dose was increased  Seen in the emergency room 11/01/2016 for chest pain Atypical in nature  In the ER 02/2016 ABD pain. He still does not know the cause of his pain  Previously called EMS HR 147, on couch, did not feel right Sinus tach on EKG he provided to [place] today  Felt he was having a panic attack Talked with EMS and heart rate improved No further episodes since that time  He reports having periodic tachycardia when he wakes up in the morning  with associated hypoxemia. Numbers on his pulse oximeter are in the 80s  does not use oxygen at nighttime to sleep  April 2017 went to the emergency room with chest tightness, kept in the hospital, refused stress test, had cardiac catheterization showing patent stent, disease was relatively unchanged Date of catheterization 09/29/2015 Echocardiogram confirmed ejection fraction 65%  Previously did a PFT,  technician reported that he passed out. He does not remember any significant difficulty but the test was abruptly stopped  Unable to tolerate isosorbide secondary to headache previously total cholesterol was 200  he presented to the hospital 02/26/2014 with chest pain. He seen by cardiology, rule out for MI, had normal EKG and discharged home. Echocardiogram July 2015 showed ejection fraction 55-60%  Creatinine in 10/22/2013 was 2.1  PMH:   has a past medical history of Anginal pain ([place]) (06/2017), Anxiety, Bronchitis (06/2017), Chronic chest pain, Chronic kidney disease (CKD) stage G3b/A1, moderately decreased glomerular filtration rate (GFR) between 30-44 mL/min/1.73 square meter and albuminuria creatinine ratio less than 30 mg/g (HCC), Chronic lower back pain, COPD (chronic obstructive pulmonary disease) ([place]), Coronary artery disease, Daily headache, Depression, Diastolic dysfunction, Dyspnea, GERD (gastroesophageal reflux disease), Hearing loss, High cholesterol, Hypertension, Myocardial infarction ([place]) (2015), Nicotine addiction, Orthopnea, and Sleep apnea.  PSH:    Past Surgical History:  Procedure Laterality Date  . CARDIAC CATHETERIZATION     MC x 1 stent  . CARDIAC CATHETERIZATION Left 03/12/2015   Procedure: Left Heart Cath and Coronary Angiography;  Surgeon: Leonie Man, MD;  Location: [facility];  Service: Cardiovascular;  Laterality: Left;  . CARDIAC CATHETERIZATION N/A 09/29/2015   Procedure: Left Heart Cath and Coronary Angiography;  Surgeon: Wellington Hampshire, MD;  Location: [place] [facility];  Service: Cardiovascular;  Laterality: N/A;  . CATARACT EXTRACTION W/PHACO Right 08/10/2017   Procedure: CATARACT EXTRACTION PHACO AND INTRAOCULAR LENS PLACEMENT ([place]) RIGHT;  Surgeon: Leandrew Koyanagi, MD;  Location: [place];  Service: Ophthalmology;  Laterality: Right;  . CORONARY ANGIOPLASTY     STENT PLACEMENT  . LEFT HEART CATHETERIZATION WITH  CORONARY ANGIOGRAM N/A 12/12/2013   Procedure: LEFT HEART CATHETERIZATION WITH CORONARY ANGIOGRAM;  Surgeon: Wellington Hampshire, MD;  Location: [facility] [facility];  Service: Cardiovascular;  Laterality: N/A;    Current Outpatient Medications  Medication Sig Dispense Refill  . acetaminophen (TYLENOL) 500 MG tablet Take 1,000 mg by mouth daily as needed for headache.     . albuterol (PROVENTIL HFA;VENTOLIN HFA) 108 (90 Base) MCG/ACT inhaler Inhale into the lungs every 6 (six) hours as needed for wheezing or shortness of breath.    Marland Kitchen albuterol (PROVENTIL) (2.5 MG/3ML) 0.083% nebulizer solution Take 3 mLs (2.5 mg total) by nebulization every 6 (six) hours as needed for wheezing or shortness of breath. 30 vial 0  . ALPRAZolam (XANAX XR) 3 MG 24 hr tablet Take 1-3 mg by mouth 2 (two) times daily. 1 MG IN THE MORNING AND 3 MG IN THE EVENING    . ALPRAZolam (XANAX) 1 MG tablet Take 1 mg by mouth daily.   5  . aspirin 81 MG EC tablet Take 1 tablet (81 mg total) by mouth daily. (Patient taking differently: Take 81 mg by mouth daily. BREAKFAST) 90 tablet 3  . atorvastatin (LIPITOR) 80 MG tablet TAKE 1 TABLET BY MOUTH DAILY 90 tablet 3  . budesonide-formoterol (SYMBICORT) 160-4.5 MCG/ACT inhaler Inhale 2 puffs into the lungs 2 (two) times daily. (Patient taking differently: Inhale 2 puffs into the lungs  2 (two) times daily. BREAKFAST AND BEDTIME) 3 Inhaler 1  . Cannabidiol 100 MG/ML SOLN Take by mouth 2 (two) times daily.    . cyclobenzaprine (FLEXERIL) 10 MG tablet Take 10 mg by mouth 3 (three) times daily as needed for muscle spasms.    Marland Kitchen ezetimibe (ZETIA) 10 MG tablet Take 1 tablet (10 mg total) by mouth daily. (Patient taking differently: Take 10 mg by mouth daily. DINNER) 90 tablet 3  . furosemide (LASIX) 20 MG tablet Take 20 mg by mouth daily.     Marland Kitchen gabapentin (NEURONTIN) 100 MG capsule Take 100 mg by mouth 2 (two) times daily. Take 2 pills twice a day    . hydrALAZINE (APRESOLINE) 25 MG tablet Take 25 mg by mouth  as needed.    . isosorbide mononitrate (IMDUR) 30 MG 24 hr tablet Take 1 tablet (30 mg total) by mouth daily. 90 tablet 3  . metoprolol succinate (TOPROL-XL) 25 MG 24 hr tablet Take 1 tablet (25 mg total) by mouth daily. 90 tablet 3  . nitroGLYCERIN (NITROSTAT) 0.4 MG SL tablet Place 1 tablet (0.4 mg total) under the tongue every 5 (five) minutes as needed for chest pain. 25 tablet 1  . omeprazole (PRILOSEC) 40 MG capsule Take 1 capsule (40 mg total) by mouth daily. (Patient taking differently: Take 40 mg by mouth daily. AM) 90 capsule 1  . OXYGEN Inhale 2 L into the lungs daily as needed (oxygen).     . roflumilast (DALIRESP) 500 MCG TABS tablet Take 1 tablet (500 mcg total) by mouth daily. (Patient taking differently: Take 500 mcg by mouth daily. LUNCH) 90 tablet 1  . Spacer/Aero-Holding Chambers (AEROCHAMBER W/FLOWSIGNAL) inhaler as needed.    . tamsulosin (FLOMAX) 0.4 MG CAPS capsule Take 1 capsule (0.4 mg total) by mouth daily. 30 capsule 11  . tiotropium (SPIRIVA) 18 MCG inhalation capsule Place 1 capsule (18 mcg total) into inhaler and inhale daily. (Patient taking differently: Place 18 mcg into inhaler and inhale daily. AM) 90 capsule 1  . VENTOLIN HFA 108 (90 Base) MCG/ACT inhaler INHALE 1 TO 2 PUFFS INTO THE LUNGS EVERY 6 HOURS AS NEEDED FOR WHEEZING OR SHORTNESS OF BREATH. 54 g 0   No current facility-administered medications for this visit.      Allergies:   Paroxetine hcl; Serotonin reuptake inhibitors (ssris); Diazepam; Doxycycline monohydrate; Escitalopram oxalate; Lorazepam; and Tetracyclines & related   Social History:  The patient  reports that he quit smoking about 8 years ago. His smoking use included e-cigarettes. He quit after 48.00 years of use. He has never used smokeless tobacco. He reports that he drinks about 6.0 standard drinks of alcohol per week. He reports that he does not use drugs.   Family History:   family history includes Coronary artery disease in his brother;  Heart disease in his father; Heart disease (age of onset: 46) in his brother.    Review of Systems: Review of Systems  Constitutional: Negative.   Respiratory: Positive for shortness of breath.   Cardiovascular: Positive for chest pain.  Gastrointestinal: Negative.   Musculoskeletal: Positive for back pain.       Left leg pain  Neurological: Negative.   Psychiatric/Behavioral: Negative.   All other systems reviewed and are negative.    PHYSICAL EXAM: VS:  BP 124/84 (BP Location: Left Arm, Patient Position: Sitting, Cuff Size: Normal)   Pulse 92   Ht 5\' 9"  (1.753 m)   Wt 209 lb 4 oz (94.9 kg)  BMI 30.90 kg/m  , BMI Body mass index is 30.9 kg/m. Constitutional:  oriented to person, place, and time. No distress.  HENT:  Head: Normocephalic and atraumatic.  Eyes:  no discharge. No scleral icterus.  Neck: Normal range of motion. Neck supple. No JVD present.  Cardiovascular: Normal rate, regular rhythm, normal heart sounds and intact distal pulses. Exam reveals no gallop and no friction rub. No edema No murmur heard. Pulmonary/Chest: Moderately decreased breath sounds throughout,  no stridor. No respiratory distress.  no wheezes.  no rales.  no tenderness.  Abdominal: Soft.  no distension.  no tenderness.  Musculoskeletal: Normal range of motion.  no  tenderness or deformity.  Neurological:  normal muscle tone. Coordination normal. No atrophy Skin: Skin is warm and dry. No rash noted. not diaphoretic.  Psychiatric:  normal mood and affect. behavior is normal. Thought content normal.   Recent Labs: 09/29/2017: Hemoglobin 13.8; Platelets 299 09/30/2017: BUN 11; Creatinine, Ser 1.52; Magnesium 2.2; Potassium 3.5; Sodium 138; TSH 0.885    Lipid Panel Lab Results  Component Value Date   CHOL 106 07/27/2016   HDL 40 (L) 07/27/2016   LDLCALC 42 07/27/2016   TRIG 118 07/27/2016      Wt Readings from Last 3 Encounters:  03/14/18 209 lb 4 oz (94.9 kg)  01/16/18 202 lb 8 oz  (91.9 kg)  11/16/17 202 lb 12 oz (92 kg)     ASSESSMENT AND PLAN:   Stable angina Long discussion concerning his stable angina Recommended if he continues to take infrequent nitroglycerin and symptoms resolve with 1 tab we could continue to monitor symptoms If he starts having escalation of frequency and requiring more nitro he may need cardiac catheterization No medication changes made  Essential hypertension - Plan: EKG 12-Lead Blood pressure is well controlled on today's visit. No changes made to the medications. 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 EKG 12-Lead     Signed, Esmond Plants, M.D., Ph.D. 03/14/2018  [place], [place]

## 2018-03-14 ENCOUNTER — Encounter: Payer: Self-pay | Admitting: Cardiovascular Disease

## 2018-03-14 ENCOUNTER — Ambulatory Visit: Payer: PPO | Admitting: Cardiovascular Disease

## 2018-03-14 VITALS — BP 124/84 | HR 92 | Ht 69.0 in | Wt 209.2 lb

## 2018-03-14 DIAGNOSIS — F17298 Nicotine dependence, other tobacco product, with other nicotine-induced disorders: Secondary | ICD-10-CM

## 2018-03-14 DIAGNOSIS — I739 Peripheral vascular disease, unspecified: Secondary | ICD-10-CM

## 2018-03-14 DIAGNOSIS — J432 Centrilobular emphysema: Secondary | ICD-10-CM

## 2018-03-14 DIAGNOSIS — G8929 Other chronic pain: Secondary | ICD-10-CM | POA: Diagnosis not present

## 2018-03-14 DIAGNOSIS — R079 Chest pain, unspecified: Secondary | ICD-10-CM

## 2018-03-14 DIAGNOSIS — N183 Chronic kidney disease, stage 3 unspecified: Secondary | ICD-10-CM

## 2018-03-14 DIAGNOSIS — Z23 Encounter for immunization: Secondary | ICD-10-CM | POA: Diagnosis not present

## 2018-03-14 DIAGNOSIS — I10 Essential (primary) hypertension: Secondary | ICD-10-CM | POA: Diagnosis not present

## 2018-03-14 DIAGNOSIS — E782 Mixed hyperlipidemia: Secondary | ICD-10-CM

## 2018-03-14 DIAGNOSIS — I471 Supraventricular tachycardia: Secondary | ICD-10-CM

## 2018-03-14 DIAGNOSIS — I25110 Atherosclerotic heart disease of native coronary artery with unstable angina pectoris: Secondary | ICD-10-CM

## 2018-03-14 DIAGNOSIS — I4719 Other supraventricular tachycardia: Secondary | ICD-10-CM

## 2018-03-14 NOTE — Patient Instructions (Signed)

## 2018-03-20 DIAGNOSIS — H2512 Age-related nuclear cataract, left eye: Secondary | ICD-10-CM | POA: Diagnosis not present

## 2018-03-20 DIAGNOSIS — H353211 Exudative age-related macular degeneration, right eye, with active choroidal neovascularization: Secondary | ICD-10-CM | POA: Diagnosis not present

## 2018-04-07 DIAGNOSIS — J449 Chronic obstructive pulmonary disease, unspecified: Secondary | ICD-10-CM | POA: Diagnosis not present

## 2018-04-28 DIAGNOSIS — R0609 Other forms of dyspnea: Secondary | ICD-10-CM | POA: Diagnosis not present

## 2018-04-28 DIAGNOSIS — J439 Emphysema, unspecified: Secondary | ICD-10-CM | POA: Diagnosis not present

## 2018-04-28 DIAGNOSIS — Z9981 Dependence on supplemental oxygen: Secondary | ICD-10-CM | POA: Diagnosis not present

## 2018-05-01 ENCOUNTER — Encounter: Payer: Self-pay | Admitting: *Deleted

## 2018-05-01 ENCOUNTER — Other Ambulatory Visit: Payer: Self-pay | Admitting: *Deleted

## 2018-05-01 NOTE — Patient Outreach (Signed)
[place] [facility]) Care Management  [place]  05/01/2018   William Mueller [birth year] [id number]   [place] Quarterly Outreach   Referral Date:  07/22/2017 Referral Source:  Nurse Line Reason for Referral:  Lump to left side of neck that has been there for 1 month comes and goes.  Causing problems with swallowing Insurance:  [employer]   Outreach Attempt:  Successful telephone outreach to patient for quarterly follow up.  HIPAA verified with wife per patient's request.  Wife reports patient is doing fine.  Wearing oxygen at 2 l/m currently.  Gets short of breath when walking throughout the home without oxygen on.  Denies any increase in shortness of breath or need to use rescue inhaler in the past week.  Wife does report patient having chest pains last night, relieved with 1 SL NTG.  Noted Cardiology's note of angina to report chest pains not relieved with 1 SL NTG.  Discussed Cardiology instructions with wife and encouraged compliance.  Reports receiving new home oxygen concentrator.  Encounter Medications:  Outpatient Encounter Medications as of 05/01/2018  Medication Sig Note  . acetaminophen (TYLENOL) 500 MG tablet Take 1,000 mg by mouth daily as needed for headache.    . albuterol (PROVENTIL HFA;VENTOLIN HFA) 108 (90 Base) MCG/ACT inhaler Inhale into the lungs every 6 (six) hours as needed for wheezing or shortness of breath.   Marland Kitchen albuterol (PROVENTIL) (2.5 MG/3ML) 0.083% nebulizer solution Take 3 mLs (2.5 mg total) by nebulization every 6 (six) hours as needed for wheezing or shortness of breath.   . ALPRAZolam (XANAX XR) 3 MG 24 hr tablet Take 1-3 mg by mouth 2 (two) times daily. 1 MG IN THE MORNING AND 3 MG IN THE EVENING 09/28/2017: Patient and wife reports taking 1 mg in am and 3 mg in the evening  . ALPRAZolam (XANAX) 1 MG tablet Take 1 mg by mouth daily.    Marland Kitchen aspirin 81 MG EC tablet Take 1 tablet (81 mg total) by mouth daily. (Patient taking  differently: Take 81 mg by mouth daily. BREAKFAST)   . atorvastatin (LIPITOR) 80 MG tablet TAKE 1 TABLET BY MOUTH DAILY   . budesonide-formoterol (SYMBICORT) 160-4.5 MCG/ACT inhaler Inhale 2 puffs into the lungs 2 (two) times daily. (Patient taking differently: Inhale 2 puffs into the lungs 2 (two) times daily. BREAKFAST AND BEDTIME)   . Cannabidiol 100 MG/ML SOLN Take by mouth 2 (two) times daily.   Marland Kitchen ezetimibe (ZETIA) 10 MG tablet Take 1 tablet (10 mg total) by mouth daily. (Patient taking differently: Take 10 mg by mouth daily. DINNER)   . furosemide (LASIX) 20 MG tablet Take 20 mg by mouth daily.    Marland Kitchen gabapentin (NEURONTIN) 100 MG capsule Take 100 mg by mouth 2 (two) times daily. Take 2 pills twice a day   . hydrALAZINE (APRESOLINE) 25 MG tablet Take 25 mg by mouth as needed.   . isosorbide mononitrate (IMDUR) 30 MG 24 hr tablet Take 1 tablet (30 mg total) by mouth daily.   . metoprolol succinate (TOPROL-XL) 25 MG 24 hr tablet Take 1 tablet (25 mg total) by mouth daily.   . nitroGLYCERIN (NITROSTAT) 0.4 MG SL tablet Place 1 tablet (0.4 mg total) under the tongue every 5 (five) minutes as needed for chest pain.   Marland Kitchen omeprazole (PRILOSEC) 40 MG capsule Take 1 capsule (40 mg total) by mouth daily. (Patient taking differently: Take 40 mg by mouth daily. AM)   . OXYGEN  Inhale 2 L into the lungs daily as needed (oxygen).  01/18/2018: Wife reports patient using 3-4 liters at all times per recommendations from Cardiologist  . roflumilast (DALIRESP) 500 MCG TABS tablet Take 1 tablet (500 mcg total) by mouth daily. (Patient taking differently: Take 500 mcg by mouth daily. LUNCH)   . Spacer/Aero-Holding Chambers (AEROCHAMBER W/FLOWSIGNAL) inhaler as needed.   . tiotropium (SPIRIVA) 18 MCG inhalation capsule Place 1 capsule (18 mcg total) into inhaler and inhale daily. (Patient taking differently: Place 18 mcg into inhaler and inhale daily. AM)   . VENTOLIN HFA 108 (90 Base) MCG/ACT inhaler INHALE 1 TO 2 PUFFS  INTO THE LUNGS EVERY 6 HOURS AS NEEDED FOR WHEEZING OR SHORTNESS OF BREATH.   . cyclobenzaprine (FLEXERIL) 10 MG tablet Take 10 mg by mouth 3 (three) times daily as needed for muscle spasms. 11/07/2017: Wife reports patient is not taking  . tamsulosin (FLOMAX) 0.4 MG CAPS capsule Take 1 capsule (0.4 mg total) by mouth daily. (Patient not taking: Reported on 05/01/2018) 11/07/2017: Wife reports patient not taking any longer   No facility-administered encounter medications on file as of 05/01/2018.     Functional Status:  In your present state of health, do you have any difficulty performing the following activities: 09/30/2017 08/10/2017  Hearing? N N  Comment - -  Vision? N N  Comment - -  Difficulty concentrating or making decisions? N N  Walking or climbing stairs? Y N  Dressing or bathing? N N  Doing errands, shopping? N -  Preparing Food and eating ? - -  Using the Toilet? - -  In the past six months, have you accidently leaked urine? - -  Do you have problems with loss of bowel control? - -  Managing your Medications? - -  Managing your Finances? - -  Housekeeping or managing your Housekeeping? - -  Some recent data might be hidden    Fall/Depression Screening: Fall Risk  05/01/2018 02/24/2018 12/12/2017  Falls in the past year? 0 No No  Risk for fall due to : - - Impaired balance/gait;Medication side effect   PHQ 2/9 Scores 08/03/2017 07/22/2017  PHQ - 2 Score 0 2  PHQ- 9 Score - 14    [facility] CM Care Plan Problem One     Most Recent Value  Care Plan Problem One  Knowledge defeciet related to self care management of COPD and angina  Role Documenting the Problem One  Health [place] for Problem One  Active  [facility] Long Term Goal   Patient will report no emergency room visits or hospitalizations in the next 90 days.  [facility] Long Term Goal Start Date  02/24/18  Interventions for Problem One Long Term Goal  Reviewed and discussed current care plan and goals with patient and wife,  encouraged to keep and attend scheduled medical appointments, reviewed medications and encouraged compliance, encouraged oxygen compliance at all times even while moving around in the home  [facility] CM Short Term Goal #1   Patient will report no more than 1 angina episode a week within the next 90 days.  [facility] CM Short Term Goal #1 Start Date  05/01/18  [facility] CM Short Term Goal #1 Met Date  05/01/18  Interventions for Short Term Goal #1  Reviewed how to take Sl NTG, encouraged to notify cardiologist for need for more than 1 SL NTG for chest pain relief, discussed notifying cardiologist for increased episodes of angina, reviewed signs and symptoms of heart  attack and when to seek emergency medical attention  [facility] CM Short Term Goal #2   Patient will report discussing oxygen need with pulmonologist in the next 60 days.  [facility] CM Short Term Goal #2 Start Date  02/24/18  [facility] CM Short Term Goal #2 Met Date  05/01/18     Appointments:  Patient last attended pulmonary appointment with Dr. Raul Del on 04/28/2018 and cardiology appointment with Dr. Arta Bruce on 03/14/2018.  States he has scheduled follow up with primary care provider in January 2020.  Plan: RN Health Coach will send primary care provider Quarterly Update. RN Health Coach will make next telephone outreach to patient within the month of February.  [place] [phone] [email or web address]

## 2018-05-07 DIAGNOSIS — J449 Chronic obstructive pulmonary disease, unspecified: Secondary | ICD-10-CM | POA: Diagnosis not present

## 2018-05-15 ENCOUNTER — Other Ambulatory Visit: Payer: Self-pay

## 2018-05-15 ENCOUNTER — Emergency Department: Payer: PPO

## 2018-05-15 ENCOUNTER — Emergency Department
Admission: EM | Admit: 2018-05-15 | Discharge: 2018-05-16 | Disposition: A | Discharge status: Home / Self Care | Payer: PPO | Attending: Emergency Medicine | Admitting: Emergency Medicine

## 2018-05-15 DIAGNOSIS — R079 Chest pain, unspecified: Secondary | ICD-10-CM

## 2018-05-15 DIAGNOSIS — I2510 Atherosclerotic heart disease of native coronary artery without angina pectoris: Secondary | ICD-10-CM | POA: Insufficient documentation

## 2018-05-15 DIAGNOSIS — R42 Dizziness and giddiness: Secondary | ICD-10-CM | POA: Insufficient documentation

## 2018-05-15 DIAGNOSIS — Z87891 Personal history of nicotine dependence: Secondary | ICD-10-CM | POA: Insufficient documentation

## 2018-05-15 DIAGNOSIS — N183 Chronic kidney disease, stage 3 (moderate): Secondary | ICD-10-CM | POA: Insufficient documentation

## 2018-05-15 DIAGNOSIS — I129 Hypertensive chronic kidney disease with stage 1 through stage 4 chronic kidney disease, or unspecified chronic kidney disease: Secondary | ICD-10-CM | POA: Diagnosis not present

## 2018-05-15 DIAGNOSIS — R000 Tachycardia, unspecified: Secondary | ICD-10-CM | POA: Diagnosis not present

## 2018-05-15 DIAGNOSIS — J449 Chronic obstructive pulmonary disease, unspecified: Secondary | ICD-10-CM | POA: Insufficient documentation

## 2018-05-15 DIAGNOSIS — I10 Essential (primary) hypertension: Secondary | ICD-10-CM | POA: Diagnosis not present

## 2018-05-15 DIAGNOSIS — Z79899 Other long term (current) drug therapy: Secondary | ICD-10-CM | POA: Insufficient documentation

## 2018-05-15 DIAGNOSIS — R0789 Other chest pain: Secondary | ICD-10-CM | POA: Insufficient documentation

## 2018-05-15 DIAGNOSIS — R55 Syncope and collapse: Secondary | ICD-10-CM | POA: Insufficient documentation

## 2018-05-15 LAB — COMPREHENSIVE METABOLIC PANEL
ALT: 20 U/L (ref 0–44)
ANION GAP: 7 (ref 5–15)
AST: 27 U/L (ref 15–41)
Albumin: 3.8 g/dL (ref 3.5–5.0)
Alkaline Phosphatase: 115 U/L (ref 38–126)
BUN: 21 mg/dL (ref 8–23)
CO2: 32 mmol/L (ref 22–32)
Calcium: 8.7 mg/dL — ABNORMAL LOW (ref 8.9–10.3)
Chloride: 97 mmol/L — ABNORMAL LOW (ref 98–111)
Creatinine, Ser: 1.63 mg/dL — ABNORMAL HIGH (ref 0.61–1.24)
GFR calc Af Amer: 49 mL/min — ABNORMAL LOW (ref >60)
GFR calc non Af Amer: 43 mL/min — ABNORMAL LOW (ref >60)
Glucose, Bld: 122 mg/dL — ABNORMAL HIGH (ref 70–99)
Potassium: 3.7 mmol/L (ref 3.5–5.1)
Sodium: 136 mmol/L (ref 135–145)
Total Bilirubin: 0.5 mg/dL (ref 0.3–1.2)
Total Protein: 6.9 g/dL (ref 6.5–8.1)

## 2018-05-15 LAB — CBC
HCT: 38.4 % — ABNORMAL LOW (ref 39.0–52.0)
Hemoglobin: 12.5 g/dL — ABNORMAL LOW (ref 13.0–17.0)
MCH: 28.1 pg (ref 26.0–34.0)
MCHC: 32.6 g/dL (ref 30.0–36.0)
MCV: 86.3 fL (ref 80.0–100.0)
Platelets: 272 10*3/uL (ref 150–400)
RBC: 4.45 MIL/uL (ref 4.22–5.81)
RDW: 12.1 % (ref 11.5–15.5)
WBC: 9.3 10*3/uL (ref 4.0–10.5)
nRBC: 0 % (ref 0.0–0.2)

## 2018-05-15 LAB — TROPONIN I: Troponin I: <0.03 ng/mL (ref <0.03)

## 2018-05-15 MED ORDER — MECLIZINE HCL 25 MG PO TABS
25.0000 mg | ORAL_TABLET | Freq: Once | ORAL | Status: AC
  Administered 2018-05-15: 25 mg via ORAL
  Filled 2018-05-15: qty 1

## 2018-05-15 MED ORDER — ASPIRIN 81 MG PO CHEW
324.0000 mg | CHEWABLE_TABLET | Freq: Once | ORAL | Status: AC
  Administered 2018-05-15: 324 mg via ORAL
  Filled 2018-05-15: qty 4

## 2018-05-15 NOTE — ED Triage Notes (Signed)
Pt arrived from home via EMS with complaints of a syncopal episode and chest tightness on tonight. Pt bent over to feed his dog when he became dizzy and fell over onto his right arm. Pt denies any pain at this time. Pt has Hx of heart attack. EMS gave 324 ASA and 2 Nitros which resolved his chest pain. VS per EMS initial BP-200/100 repeat BP-154/96 HR-90 O2sat-96% on his normal 2L nasal cannula. CBG-131. EMS placed a 20 gauge in left AC. Pt also states that his left ear is "stuffed" and cant really hear out of it and he has a "popping" sound in his head.

## 2018-05-15 NOTE — ED Provider Notes (Signed)
[facility] Emergency Department Provider Note  ____________________________________________   First MD Initiated Contact with Patient 05/15/18 2022     (approximate)  I have reviewed the triage vital signs and the nursing notes.   HISTORY  Chief Complaint No chief complaint on file.   HPI William Mueller is a 67 y.o. male sent to the emergency department for treatment and evaluation  of near syncope and chest tightness tonight.  He states that he had dog food in both hands he bent over became dizzy and fell over onto his right arm.  He did not completely lose consciousness and he did not strike his head.  He has a history of similar symptoms in the past and was subsequently diagnosed with MI.  Currently, he has no chest pain or chest tightness.  He also states that he has muffled hearing in the left ear and hears popping sounds.  He has been evaluated for this by his primary care provider and had a cerumen disimpaction but states that it seems like this is made symptoms worse.  He denies any pain in the ear or headache.   Past Medical History:  Diagnosis Date  . Anginal pain ([place]) 06/2017  . Anxiety   . Bronchitis 06/2017  . Chronic chest pain   . Chronic kidney disease (CKD) stage G3b/A1, moderately decreased glomerular filtration rate (GFR) between 30-44 mL/min/1.73 square meter and albuminuria creatinine ratio less than 30 mg/g (HCC)   . Chronic lower back pain    WITH LEG WEAKNESS  . COPD (chronic obstructive pulmonary disease) (HCC)    EMPHYSEMA, O2 AT 2L PRN  . Coronary artery disease    a. 12/2013 PCI: mRCA 100% with L to R collats s/p PCI/DES;  b. 06/2014 MV: no ischemia/infarct, EF 53%;  c. 03/2015 Cath: LM nl, LAD nl, D1 80 (1.44mm), LCX 32m (<1.62mm), OM1 nl, RCA 55p, patent stent, RPDA nl, EF 65%; d. 09/2015 Cath: LM nl, LAD 60m, D1 80 (<81mm), LCX 44m/d (<1.4mm), OM1 nl, RCA 55p (FFR 0.93), patent stent, RPDA nl-->Med Rx.  . Daily headache   .  Depression   . Diastolic dysfunction    a. echo 12/2013: EF 55-60%, mild LVH, GR1DD, inf HK, elevated CVP, mildly dilated IVC suggestive of increased RA pressure  . Dyspnea    WHEEZING  . GERD (gastroesophageal reflux disease)    REFLUX  . Hearing loss   . High cholesterol   . Hypertension   . Myocardial infarction ([place]) 2015  . Nicotine addiction    a. using eCigs.  . Orthopnea   . Sleep apnea     Patient Active Problem List   Diagnosis Date Noted  . Chest pain 09/30/2017  . Hypokalemia 09/29/2017  . Near syncope 09/29/2017  . Orthostatic hypotension 09/29/2017  . Nicotine addiction   . CAD S/P PCI DES to RCA 03/11/2015  . Diastolic dysfunction   . Chronic chest pain   . Chest pain at rest 11/23/2014  . GERD (gastroesophageal reflux disease) 06/08/2014  . CKD (chronic kidney disease) stage 3, GFR 30-59 ml/min (HCC) 05/04/2014  . Bilateral leg pain 05/04/2014  . Lung nodule < 6cm on CT 05/04/2014  . Abnormal CT scan, kidney 05/04/2014  . Unstable angina ([place]) 05/04/2014  . Atypical chest pain 03/22/2014  . Atrial tachycardia, paroxysmal ([place]) 12/14/2013  . Atherosclerosis of native coronary artery with unstable angina pectoris ([place]) 12/13/2013  . NSTEMI (non-ST elevated myocardial infarction) ([place]) 12/13/2013  . Labile essential  hypertension 12/11/2013  . Acute renal failure superimposed on stage 3 chronic kidney disease ([place]) 12/11/2013  . Hyperlipidemia 12/11/2013  . COPD (chronic obstructive pulmonary disease) ([place]) 12/11/2013    Past Surgical History:  Procedure Laterality Date  . CARDIAC CATHETERIZATION     MC x 1 stent  . CARDIAC CATHETERIZATION Left 03/12/2015   Procedure: Left Heart Cath and Coronary Angiography;  Surgeon: Leonie Man, MD;  Location: [facility];  Service: Cardiovascular;  Laterality: Left;  . CARDIAC CATHETERIZATION N/A 09/29/2015   Procedure: Left Heart Cath and Coronary Angiography;  Surgeon: Wellington Hampshire, MD;  Location:  [place] [facility];  Service: Cardiovascular;  Laterality: N/A;  . CATARACT EXTRACTION W/PHACO Right 08/10/2017   Procedure: CATARACT EXTRACTION PHACO AND INTRAOCULAR LENS PLACEMENT ([place]) RIGHT;  Surgeon: Leandrew Koyanagi, MD;  Location: [facility];  Service: Ophthalmology;  Laterality: Right;  . CORONARY ANGIOPLASTY     STENT PLACEMENT  . LEFT HEART CATHETERIZATION WITH CORONARY ANGIOGRAM N/A 12/12/2013   Procedure: LEFT HEART CATHETERIZATION WITH CORONARY ANGIOGRAM;  Surgeon: Wellington Hampshire, MD;  Location: [facility];  Service: Cardiovascular;  Laterality: N/A;    Prior to Admission medications   Medication Sig Start Date End Date Taking? Authorizing Provider  acetaminophen (TYLENOL) 500 MG tablet Take 1,000 mg by mouth daily as needed for headache.     [provider]  albuterol (PROVENTIL HFA;VENTOLIN HFA) 108 (90 Base) MCG/ACT inhaler Inhale into the lungs every 6 (six) hours as needed for wheezing or shortness of breath.    [provider]  albuterol (PROVENTIL) (2.5 MG/3ML) 0.083% nebulizer solution Take 3 mLs (2.5 mg total) by nebulization every 6 (six) hours as needed for wheezing or shortness of breath. 06/12/17   Johnn Hai, PA-C  ALPRAZolam (XANAX XR) 3 MG 24 hr tablet Take 1-3 mg by mouth 2 (two) times daily. 1 MG IN THE MORNING AND 3 MG IN THE EVENING    [provider]  ALPRAZolam (XANAX) 1 MG tablet Take 1 mg by mouth daily.  02/15/18   [provider]  aspirin 81 MG EC tablet Take 1 tablet (81 mg total) by mouth daily. Patient taking differently: Take 81 mg by mouth daily. BREAKFAST 09/04/15   Minna Merritts, MD  atorvastatin (LIPITOR) 80 MG tablet TAKE 1 TABLET BY MOUTH DAILY 12/12/17   Minna Merritts, MD  budesonide-formoterol [facility]) 160-4.5 MCG/ACT inhaler Inhale 2 puffs into the lungs 2 (two) times daily. Patient taking differently: Inhale 2 puffs into the lungs 2 (two) times daily. BREAKFAST AND BEDTIME 09/04/15    Laverle Hobby, MD  Cannabidiol 100 MG/ML SOLN Take by mouth 2 (two) times daily.    [provider]  cyclobenzaprine (FLEXERIL) 10 MG tablet Take 10 mg by mouth 3 (three) times daily as needed for muscle spasms.    [provider]  ezetimibe (ZETIA) 10 MG tablet Take 1 tablet (10 mg total) by mouth daily. Patient taking differently: Take 10 mg by mouth daily. DINNER 07/01/17   Minna Merritts, MD  furosemide (LASIX) 20 MG tablet Take 20 mg by mouth daily.     [provider]  gabapentin (NEURONTIN) 100 MG capsule Take 100 mg by mouth 2 (two) times daily. Take 2 pills twice a day    [provider]  hydrALAZINE (APRESOLINE) 25 MG tablet Take 25 mg by mouth as needed.    [provider]  isosorbide mononitrate (IMDUR) 30 MG 24 hr  tablet Take 1 tablet (30 mg total) by mouth daily. 01/16/18   Minna Merritts, MD  meclizine (ANTIVERT) 25 MG tablet Take 1 tablet (25 mg total) by mouth 3 (three) times daily as needed for dizziness. 05/16/18   Rajohn Henery, Johnette Abraham B, FNP  metoprolol succinate (TOPROL-XL) 25 MG 24 hr tablet Take 1 tablet (25 mg total) by mouth daily. 11/16/17   Minna Merritts, MD  nitroGLYCERIN (NITROSTAT) 0.4 MG SL tablet Place 1 tablet (0.4 mg total) under the tongue every 5 (five) minutes as needed for chest pain. 01/16/18   Minna Merritts, MD  omeprazole (PRILOSEC) 40 MG capsule Take 1 capsule (40 mg total) by mouth daily. Patient taking differently: Take 40 mg by mouth daily. AM 06/28/16   Minna Merritts, MD  OXYGEN Inhale 2 L into the lungs daily as needed (oxygen).     [provider]  pseudoephedrine (SUDAFED) 30 MG tablet Take 1 tablet (30 mg total) by mouth every 6 (six) hours as needed for congestion. 05/16/18 05/16/19  Trek Kimball, Johnette Abraham B, FNP  roflumilast (DALIRESP) 500 MCG TABS tablet Take 1 tablet (500 mcg total) by mouth daily. Patient taking differently: Take 500 mcg by mouth daily. LUNCH 09/04/15   Laverle Hobby, MD  Spacer/Aero-Holding Chambers (AEROCHAMBER W/FLOWSIGNAL) inhaler as needed. 06/21/17 06/21/18  [provider]  tamsulosin (FLOMAX) 0.4 MG CAPS capsule Take 1 capsule (0.4 mg total) by mouth daily. Patient not taking: Reported on 05/01/2018 09/28/17   Hollice Espy, MD  tiotropium [facility]) 18 MCG inhalation capsule Place 1 capsule (18 mcg total) into inhaler and inhale daily. Patient taking differently: Place 18 mcg into inhaler and inhale daily. AM 09/04/15   Laverle Hobby, MD  VENTOLIN HFA 108 (90 Base) MCG/ACT inhaler INHALE 1 TO 2 PUFFS INTO THE LUNGS EVERY 6 HOURS AS NEEDED FOR WHEEZING OR SHORTNESS OF BREATH. 03/25/16   Laverle Hobby, MD    Allergies Paroxetine hcl; Serotonin reuptake inhibitors (ssris); Diazepam; Doxycycline monohydrate; Escitalopram oxalate; Lorazepam; and Tetracyclines & related  Family History  Problem Relation Age of Onset  . Coronary artery disease Brother   . Heart disease Brother 71  . Heart disease Father        MI x 8  . Kidney disease Neg Hx   . Prostate cancer Neg Hx     Social History Social History   Tobacco Use  . Smoking status: Former Smoker    Years: 48.00    Types: E-cigarettes    Last attempt to quit: 06/07/2009    Years since quitting: 8.9  . Smokeless tobacco: Never Used  . Tobacco comment: Using electronic cigarette  Substance Use Topics  . Alcohol use: Yes    Alcohol/week: 6.0 standard drinks    Types: 6 Cans of beer per week    Comment: Occasional   . Drug use: No    Review of Systems  Constitutional: No fever/chills. Eyes: No visual changes. ENT: No sore throat. Cardiovascular: Positive for chest pressure.  Negative for pleuritic pain.  Negative for palpitations.  Negative for leg pain. Respiratory: Negative shortness of breath. Gastrointestinal: Negative for abdominal pain.  No nausea, negative for vomiting.  No diarrhea.  No constipation. Genitourinary: Negative for  dysuria. Musculoskeletal: Negative for back pain.  Skin: Negative for rash, lesion, wound. Neurological: Negative for headaches, focal weakness or numbness.   ____________________________________________   PHYSICAL EXAM:  VITAL SIGNS: ED Triage Vitals  Enc Vitals Group     BP 05/15/18 2000 (!) 153/91  Pulse Rate 05/15/18 2000 88     Resp 05/15/18 2000 14     Temp 05/15/18 2000 98.8 F (37.1 C)     Temp Source 05/15/18 2000 Oral     SpO2 05/15/18 2000 97 %     Weight 05/15/18 2001 208 lb (94.3 kg)     Height 05/15/18 2001 5\' 9"  (1.753 m)     Head Circumference --      Peak Flow --      Pain Score 05/15/18 2001 0     Pain Loc --      Pain Edu? --      Excl. in [place]? --     Constitutional: Alert and oriented. Well appearing and in no acute distress. Normal mental status. Eyes: Conjunctivae are normal. PERRL. Head: Atraumatic. Nose: No congestion/rhinnorhea. Mouth/Throat: Mucous membranes are moist.  Oropharynx non-erythematous. Tongue normal in size and color. Neck: No stridor. No carotid bruit appreciated on exam. Hematological/Lymphatic/Immunilogical: No cervical lymphadenopathy. Cardiovascular: Normal rate, regular rhythm. Grossly normal heart sounds.  Good peripheral circulation. Respiratory: Normal respiratory effort.  No retractions. Lungs CTAB. Gastrointestinal: Soft and nontender. No distention. No abdominal bruits. No CVA tenderness. Genitourinary: Exam deferred. Musculoskeletal: No lower extremity tenderness. No edema of extremities. Neurologic:  Normal speech and language. No gross focal neurologic deficits are appreciated. Skin:  Skin is warm, dry and intact. No rash noted. Psychiatric: Mood and affect are normal. Speech and behavior are normal.  ____________________________________________   LABS (all labs ordered are listed, but only abnormal results are displayed)  Labs Reviewed  CBC - Abnormal; Notable for the following components:      Result Value    Hemoglobin 12.5 (*)    HCT 38.4 (*)    All other components within normal limits  COMPREHENSIVE METABOLIC PANEL - Abnormal; Notable for the following components:   Chloride 97 (*)    Glucose, Bld 122 (*)    Creatinine, Ser 1.63 (*)    Calcium 8.7 (*)    GFR calc non Af Amer 43 (*)    GFR calc Af Amer 49 (*)    All other components within normal limits  TROPONIN I  TROPONIN I   ____________________________________________  EKG  ED ECG REPORT I, Sherrie George, the attending physician, personally viewed and interpreted this ECG.   Date: 05/15/2018  EKG Time: 7:55 PM  Rate: 88  Rhythm: normal EKG, normal sinus rhythm, unchanged from previous tracings, prolonged QT interval  Axis: Normal  Intervals:none  ST&T Change: No ST elevation  ____________________________________________  RADIOLOGY  ED MD interpretation: Chest x-ray shows no acute cardiopulmonary abnormality.  CT of the head without contrast shows complete opacification of the left mastoid and middle ear spaces, otherwise no acute intracranial findings.  Official radiology report(s): Dg Chest 2 View  Result Date: 05/15/2018 CLINICAL DATA:  Syncopal episode. Chest tightness tonight. EXAM: CHEST - 2 VIEW COMPARISON:  09/29/2017 and chest CTA dated 09/29/2017. FINDINGS: Normal sized heart. Clear lungs with normal vascularity. The lungs remain hyperexpanded with minimal peribronchial thickening. Unremarkable bones. IMPRESSION: No acute abnormality. Stable changes of COPD and chronic bronchitis. Electronically Signed   By: Claudie Revering M.D.   On: 05/15/2018 21:18   Ct Head Wo Contrast  Result Date: 05/15/2018 CLINICAL DATA:  Near syncope with generalized weakness EXAM: CT HEAD WITHOUT CONTRAST TECHNIQUE: Contiguous axial images were obtained from the base of the skull through the vertex without intravenous contrast. COMPARISON:  None available FINDINGS: Brain: No evidence of acute infarction,  hemorrhage, hydrocephalus,  extra-axial collection or mass effect. Small remote right cerebellar infarct. There is mild chronic small vessel ischemic type change in the cerebral white matter. Small arachnoid cyst in the right middle cranial fossa with mild temporal pole mass-effect. Cerebral volume loss causing mild ventriculomegaly. Vascular: No hyperdense vessel or unexpected calcification. Skull: There is a well-circumscribed lucency within the superficial aspect of the upper right parietal bone likely from remote trauma. Sinuses/Orbits: Complete opacification of the left mastoid and middle ear spaces with negative nasopharynx IMPRESSION: 1. No acute intracranial finding. 2. Mild atrophy and mild chronic small vessel ischemic injury. 3. Left mastoid and middle ear opacification with negative nasopharynx. Electronically Signed   By: Monte Fantasia M.D.   On: 05/15/2018 21:22    ____________________________________________   PROCEDURES  Procedure(s) performed: None  Procedures  Critical Care performed: No  ____________________________________________   INITIAL IMPRESSION / ASSESSMENT AND PLAN / ED COURSE  67 year old male presenting to the emergency department for treatment and evaluation after having a near syncopal episode with some chest tightness.  EKG, 2 serial troponins, and CT of the head are all reassuring.  Near syncopal episode likely related to vertigo.  He will be treated with Sudafed and meclizine.  Patient was also strongly encouraged to call Dr. Rockey Situ who is his cardiologist and request a follow-up appointment.  He was advised that he should return to the emergency department for chest pain, shortness of breath, syncopal episode, or other concerns if unable to see primary care or cardiology.     ____________________________________________   FINAL CLINICAL IMPRESSION(S) / ED DIAGNOSES  Final diagnoses:  Near syncope  Chest pain, unspecified type  Vertigo     ED Discharge Orders          Ordered    pseudoephedrine (SUDAFED) 30 MG tablet  Every 6 hours PRN     05/16/18 0048    meclizine (ANTIVERT) 25 MG tablet  3 times daily PRN     05/16/18 0048           Note:  This document was prepared using Dragon voice recognition software and may include unintentional dictation errors.    Victorino Dike, FNP 05/16/18 0136    Arta Silence, MD 05/19/18 [phone]

## 2018-05-16 LAB — TROPONIN I: Troponin I: <0.03 ng/mL (ref <0.03)

## 2018-05-16 MED ORDER — MECLIZINE HCL 25 MG PO TABS: 25.0000 mg | ORAL_TABLET | Freq: Three times a day (TID) | ORAL | 0 refills | Status: DC | PRN

## 2018-05-16 MED ORDER — PSEUDOEPHEDRINE HCL 30 MG PO TABS: 30.0000 mg | ORAL_TABLET | Freq: Four times a day (QID) | ORAL | 2 refills | Status: DC | PRN

## 2018-05-16 NOTE — Progress Notes (Deleted)
Cardiology Office Note Date:  05/16/2018  Patient ID:  William Mueller, William Mueller September 18, 1950, MRN [id number] PCP:  Philmore Pali, NP  Cardiologist:  Dr. Rockey Situ, MD  ***refresh   Chief Complaint: ED follow up  History of Present Illness: William Mueller is a 67 y.o. male with history of CAD status post PCI/DES to the mid RCA in 12/2013, COPD in the setting of long history of tobacco abuse with current electronic cigarette use, CKD stage III, hypertension, hyperlipidemia, sleep apnea, GERD, depression, and anxiety who presents for ED follow-up.  Patient was admitted to the hospital and 12/2013 with a non-STEMI.  He underwent PCI to the RCA at that time.  Echo showed an EF of 55 to 60%, mild LVH, inferior hypokinesis, and grade 1 diastolic dysfunction.  He was seen again in the hospital in 02/2014 with recurrent chest pain and ruled out.  EKG was normal.  He was discharged home.  Repeat hospital admission in 03/2015 for chest pain and underwent cardiac cath that showed a widely patent mid RCA stent as well as proximal RCA 55% stenosis located at a bend, first diagonal 80% stenosis with a vessel that was less than 2 mm in diameter, mid to distal LCx 90% stenosis with a vessel that was 1.5 mm.  Neither of these lesions were amenable to PCI given their small caliber.  The proximal RCA lesion had progressed from the previously noted 40% though was not flow-limiting and therefore not felt to be a PCI target.  There was appearance of possible pre-stent stepdown.  Medical management was recommended.  Echo in 04/2015 showed an EF of 60 to 65%, normal wall motion, grade 1 diastolic dysfunction, normal size left atrium, normal RV systolic function, normal PASP.  Repeat echo in 09/2015 showed an EF of 65 to 70%, normal wall motion, grade 1 diastolic dysfunction.  Repeat cardiac cath in 09/2015 showed a widely patent mid RCA stent, stable proximal moderate RCA stenosis at 55% which was not significant by FFR of 0.93, significant,  unchanged stenoses of the mid to distal left circumflex and first diagonal as well as mild to moderate calcified proximal to mid LAD stenosis of 40% which also was unchanged.  Medical therapy was recommended.  Over the years, the patient has been frequently seen in the ED for varying complaints including chest pain, abdominal pain, orthostasis, and palpitations.  Prior PFTs complicated by syncope per notes.  Most recently admitted to the hospital in 09/2016 with orthostasis, palpitations, and chest pain.  Echo showed normal LV systolic function with grade 2 diastolic dysfunction.  He was treated with IV fluids.  Patient was most recently seen by Dr. Rockey Situ in 03/2017 he continued to have episodes of chest pain requiring sublingual nitroglycerin.  There was no escalation of symptoms.  Pain was coming on more with resting and with exertion.  Continued monitoring of his angina was recommended.  Patient was seen in the [facility] ED on 05/15/2018 with complaint of chest tightness and near syncope.  He reported he was bending over to feed his dogs and became dizzy.  He fell over on to his right arm.  He did not suffer complete LOC and did not strike his head.  Patient reported a history of similar symptoms in the past with subsequent diagnosis of an MI.  Troponin was negative x2.  WBC 9.3, hemoglobin 12.5, potassium 3.7, glucose 122, serum creatinine 1.63, normal LFT.  CT of the head showed no acute intracranial finding with  mild atrophy and mild chronic small vessel ischemic pathology.  There was left mastoid and middle ear opacification.  Chest x-ray showed no acute abnormality with COPD noted.  EKG showed NSR, 88 bpm, nonspecific anterolateral ST-T changes.  Blood pressure was noted to be 153/91.  He was treated with Sudafed and meclizine.  He was felt to have vertigo.  Outpatient follow-up was recommended.  ***  Past Medical History:  Diagnosis Date  . Anginal pain ([place]) 06/2017  . Anxiety   . Bronchitis 06/2017    . Chronic chest pain   . Chronic kidney disease (CKD) stage G3b/A1, moderately decreased glomerular filtration rate (GFR) between 30-44 mL/min/1.73 square meter and albuminuria creatinine ratio less than 30 mg/g (HCC)   . Chronic lower back pain    WITH LEG WEAKNESS  . COPD (chronic obstructive pulmonary disease) (HCC)    EMPHYSEMA, O2 AT 2L PRN  . Coronary artery disease    a. 12/2013 PCI: mRCA 100% with L to R collats s/p PCI/DES;  b. 06/2014 MV: no ischemia/infarct, EF 53%;  c. 03/2015 Cath: LM nl, LAD nl, D1 80 (1.71mm), LCX 73m (<1.68mm), OM1 nl, RCA 55p, patent stent, RPDA nl, EF 65%; d. 09/2015 Cath: LM nl, LAD 3m, D1 80 (<48mm), LCX 48m/d (<1.67mm), OM1 nl, RCA 55p (FFR 0.93), patent stent, RPDA nl-->Med Rx.  . Daily headache   . Depression   . Diastolic dysfunction    a. echo 12/2013: EF 55-60%, mild LVH, GR1DD, inf HK, elevated CVP, mildly dilated IVC suggestive of increased RA pressure  . Dyspnea    WHEEZING  . GERD (gastroesophageal reflux disease)    REFLUX  . Hearing loss   . High cholesterol   . Hypertension   . Myocardial infarction ([place]) 2015  . Nicotine addiction    a. using eCigs.  . Orthopnea   . Sleep apnea     Past Surgical History:  Procedure Laterality Date  . CARDIAC CATHETERIZATION     MC x 1 stent  . CARDIAC CATHETERIZATION Left 03/12/2015   Procedure: Left Heart Cath and Coronary Angiography;  Surgeon: Leonie Man, MD;  Location: [place] [facility];  Service: Cardiovascular;  Laterality: Left;  . CARDIAC CATHETERIZATION N/A 09/29/2015   Procedure: Left Heart Cath and Coronary Angiography;  Surgeon: Wellington Hampshire, MD;  Location: [place] [facility];  Service: Cardiovascular;  Laterality: N/A;  . CATARACT EXTRACTION W/PHACO Right 08/10/2017   Procedure: CATARACT EXTRACTION PHACO AND INTRAOCULAR LENS PLACEMENT ([place]) RIGHT;  Surgeon: Leandrew Koyanagi, MD;  Location: [facility];  Service: Ophthalmology;  Laterality: Right;  . CORONARY  ANGIOPLASTY     STENT PLACEMENT  . LEFT HEART CATHETERIZATION WITH CORONARY ANGIOGRAM N/A 12/12/2013   Procedure: LEFT HEART CATHETERIZATION WITH CORONARY ANGIOGRAM;  Surgeon: Wellington Hampshire, MD;  Location: [place] CATH [facility];  Service: Cardiovascular;  Laterality: N/A;    No outpatient medications have been marked as taking for the 05/17/18 encounter (Appointment) with Rise Mu, PA-C.    Allergies:   Paroxetine hcl; Serotonin reuptake inhibitors (ssris); Diazepam; Doxycycline monohydrate; Escitalopram oxalate; Lorazepam; and Tetracyclines & related   Social History:  The patient  reports that he quit smoking about 8 years ago. His smoking use included e-cigarettes. He quit after 48.00 years of use. He has never used smokeless tobacco. He reports that he drinks about 6.0 standard drinks of alcohol per week. He reports that he does not use drugs.   Family History:  The patient's family history  includes Coronary artery disease in his brother; Heart disease in his father; Heart disease (age of onset: 78) in his brother.  ROS:   ROS   PHYSICAL EXAM: *** VS:  There were no vitals taken for this visit. BMI: There is no height or weight on file to calculate BMI.  Physical Exam   EKG:  Was ordered and interpreted by me today. Shows ***  Recent Labs: 09/30/2017: Magnesium 2.2; TSH 0.885 05/15/2018: ALT 20; BUN 21; Creatinine, Ser 1.63; Hemoglobin 12.5; Platelets 272; Potassium 3.7; Sodium 136  No results found for requested labs within last 8760 hours.   Estimated Creatinine Clearance: 49.1 mL/min (A) (by C-G formula based on SCr of 1.63 mg/dL (H)).   Wt Readings from Last 3 Encounters:  05/15/18 208 lb (94.3 kg)  03/14/18 209 lb 4 oz (94.9 kg)  01/16/18 202 lb 8 oz (91.9 kg)     Other studies reviewed: Additional studies/records reviewed today include: summarized above  ASSESSMENT AND PLAN:  1. CAD involving the native coronary arteries with*** angina: 2. HTN: 3. HLD: 4. CKD stage  III: 5. Anxiety:  Disposition: F/u with Dr. Rockey Situ or an APP in ***  Current medicines are reviewed at length with the patient today.  The patient did not have any concerns regarding medicines.  Signed, Christell Faith, PA-C 05/16/2018 9:42 AM     [place] [address] [place] [address] [place], [place] [address] [phone]

## 2018-05-16 NOTE — Discharge Instructions (Signed)
Please return to the emergency department immediately if you begin to have chest pain or other symptoms of concern.  Please call and schedule follow-up appointment with cardiology.

## 2018-05-17 ENCOUNTER — Ambulatory Visit: Payer: PPO | Admitting: Physician Assistant

## 2018-05-26 ENCOUNTER — Other Ambulatory Visit: Payer: Self-pay | Admitting: *Deleted

## 2018-05-26 NOTE — Patient Outreach (Signed)
[place] [facility]) Care Management  05/26/2018  CARLISLE ENKE 09-06-1950 [id number]   RN Health Coach Quarterly Outreach  Referral Date:07/22/2017 Referral Source:Nurse Line Reason for Referral:Lump to left side of neck that has been there for 1 month comes and goes. Causing problems with swallowing Insurance:[employer]   Outreach Attempt:  Outreach attempt #1 to patient for post ED visit and [facility] referral for medication assistance follow up. No answer. RN Health Coach left HIPAA compliant voicemail message along with contact information.  Plan:  RN Health Coach will make another outreach attempt to patient within 3-4 business days if no return call back from patient.  [place] [phone] [email or web address]

## 2018-05-29 ENCOUNTER — Other Ambulatory Visit: Payer: Self-pay | Admitting: Pharmacist

## 2018-05-29 ENCOUNTER — Other Ambulatory Visit: Payer: Self-pay | Admitting: *Deleted

## 2018-05-29 ENCOUNTER — Encounter: Payer: Self-pay | Admitting: *Deleted

## 2018-05-29 NOTE — Patient Outreach (Signed)
[place] [facility]) Care Management  05/29/2018  CARRIE SCHOONMAKER 1950-07-02 [id number]   [place] ED Outreach  Referral Date:07/22/2017 Referral Source:Nurse Line Reason for Referral:Lump to left side of neck that has been there for 1 month comes and goes. Causing problems with swallowing Insurance:[employer]   Outreach Attempt:  Successful telephone outreach to patient and wife for post emergency room follow up and possible medication assistance request.  HIPAA verified with wife.  Reports patient feeling better.  Denies any further significant chest pain or hypertension.  Reports this mornings blood pressure was 142/76.  Continues to wear home oxygen without difficulties.  Wife does report difficulties affording medication.  States his monthly cost for Dilaresp is $89, Symbicort $86, Zetia $30, and receiving Spiriva through patient assistance from [place] office.  Stating he has been in [place] since about July.  Discussed [facility] Pharmacist referral and patient and wife both verbally agree.  Wife also stating patient continues to have weakness and back pain.  Encouraged patient and wife to discuss with primary care provider at next medical appointment and to also inquire about home health therapy for strengthening.  Appointments:  Has scheduled appointment with Charlott Holler NP January 2020.  Encouraged wife to contact office to verify exact date of appointment.    Plan: RN Health Coach will send [facility] [place] referral for medication assistance. RN Health Coach will make next telephone outreach to patient within the month of February.  [place] Coach [phone] [email or web address]

## 2018-05-29 NOTE — Patient Outreach (Signed)
[place] [facility]) [place]   05/29/2018  NATHANIAL ARRIGHI 1951/04/21 [id number]  Reason for referral: Medication Assistance (Daliresp, Zetia, Symbicort, Spiriva)  Referral source: Phil Campbell Current insurance:[employer]  PMHx includes but not limited to: CKD, COPD, CAD s/p PCI/DES '15, depression, HA, diastolic dysfunction, HTN, HLD, sleep apnea, hearing loss, and tobacco abuse  Unsuccessful telephone call attempt #1 to patient. Spouse answered the phone and requests that I call back tomorrow morning.   Plan:  Will make call attempt #2 to patient tomorrow, December 24th.   Ralene Bathe, PharmD, [facility] [phone]

## 2018-05-30 ENCOUNTER — Other Ambulatory Visit: Payer: Self-pay | Admitting: Pharmacy Technician

## 2018-05-30 ENCOUNTER — Other Ambulatory Visit: Payer: Self-pay | Admitting: Pharmacist

## 2018-05-30 ENCOUNTER — Ambulatory Visit: Payer: Self-pay | Admitting: Pharmacist

## 2018-05-30 NOTE — Patient Outreach (Signed)
William Mueller [facility]) [place]   05/30/2018  William Mueller [birth year] [id number]  Reason for referral: Medication Assistance (Daliresp, Zetia, Symbicort, Spiriva)  Referral source: William Mueller Current insurance:[employer]  PMHx includes but not limited to: CKD, COPD, CAD s/p PCI/DES '15, depression, HA, diastolic dysfunction, HTN, HLD, sleep apnea, hearing loss, and tobacco abuse  Outreach:  Successful telephone call with William Mueller.  HIPAA identifiers verified. Patient gives permission for me to speak with his spouse.   Subjective:  Spouse reports difficulty affording: Symbicort, Daliresp, Spiriva (has been receiving Patient assistance program through William Mueller office), Zetia.   Objective: Lab Results  Component Value Date   CREATININE 1.63 (H) 05/15/2018   CREATININE 1.52 (H) 09/30/2017   CREATININE 1.68 (H) 09/29/2017    Lab Results  Component Value Date   HGBA1C 5.5 12/11/2013    Lipid Panel     Component Value Date/Time   CHOL 106 07/27/2016 0359   CHOL 156 05/28/2015 0818   TRIG 118 07/27/2016 0359   HDL 40 (L) 07/27/2016 0359   HDL 54 05/28/2015 0818   CHOLHDL 2.7 07/27/2016 0359   VLDL 24 07/27/2016 0359   LDLCALC 42 07/27/2016 0359   LDLCALC 73 05/28/2015 0818    BP Readings from Last 3 Encounters:  05/16/18 137/76  03/14/18 124/84  01/16/18 136/74    Allergies  Allergen Reactions  . Paroxetine Hcl Shortness Of Breath and Palpitations  . Serotonin Reuptake Inhibitors (Ssris) Shortness Of Breath and Palpitations  . Diazepam Other (See Comments)    Rapid heartrate  . Doxycycline Monohydrate Other (See Comments)    Unknown allergic reaction  . Escitalopram Oxalate Other (See Comments)    serotonin Syndrome  . Lorazepam Other (See Comments)    Unknown allergic reaction  . Tetracyclines & Related Itching and Rash    Medications Reviewed Today    Reviewed by William Mueller (Registered  Nurse) on 05/29/18 at 1215  Med List Status: <None>  Medication Order Taking? Sig Documenting Provider Last Dose Status Informant  acetaminophen (TYLENOL) 500 MG tablet [id number] No Take 1,000 mg by mouth daily as needed for headache.  Provider, Historical, Mueller Taking Active Spouse/Significant Other  albuterol (PROVENTIL HFA;VENTOLIN HFA) 108 (90 Base) MCG/ACT inhaler [id number] No Inhale into the lungs every 6 (six) hours as needed for wheezing or shortness of breath. Provider, Historical, Mueller Taking Active Spouse/Significant Other  albuterol (PROVENTIL) (2.5 MG/3ML) 0.083% nebulizer solution [id number] No Take 3 mLs (2.5 mg total) by nebulization every 6 (six) hours as needed for wheezing or shortness of breath. William Mueller Taking Active Spouse/Significant Other  ALPRAZolam (XANAX XR) 3 MG 24 hr tablet [id number] No Take 1-3 mg by mouth 2 (two) times daily. 1 MG IN THE MORNING AND 3 MG IN THE EVENING Provider, Historical, Mueller Taking Active Spouse/Significant Other           Med Note William Mueller, William Mueller   Wed Sep 28, 2017 10:16 William Mueller) Patient and wife reports taking 1 mg in William Mueller and 3 mg in the evening  ALPRAZolam (XANAX) 1 MG tablet [id number] No Take 1 mg by mouth daily.  Provider, Historical, Mueller Taking Active   aspirin 81 MG EC tablet [id number] No Take 1 tablet (81 mg total) by mouth daily.  Patient taking differently:  Take 81 mg by mouth daily. William Mueller Taking Active Spouse/Significant Other  atorvastatin (LIPITOR) 80 MG tablet [id number] No  TAKE 1 TABLET BY MOUTH DAILY William Mueller Taking Active   budesonide-formoterol [facility]) 160-4.5 MCG/ACT inhaler [id number] No Inhale 2 puffs into the lungs 2 (two) times daily.  Patient taking differently:  Inhale 2 puffs into the lungs 2 (two) times daily. BREAKFAST AND BEDTIME   William Mueller Taking Active Spouse/Significant Other  Cannabidiol 100 MG/ML SOLN [id number] No Take by mouth 2 (two) times daily.  Provider, Historical, Mueller Taking Active   cyclobenzaprine (FLEXERIL) 10 MG tablet [id number] No Take 10 mg by mouth 3 (three) times daily as needed for muscle spasms. Provider, Historical, Mueller Not Taking Active Spouse/Significant Other           Med Note Sadie Haber Nov 07, 2017  1:29 PM) Wife reports patient is not taking  ezetimibe (ZETIA) 10 MG tablet [id number] No Take 1 tablet (10 mg total) by mouth daily.  Patient taking differently:  Take 10 mg by mouth daily. William Mueller Taking Active Spouse/Significant Other  furosemide (LASIX) 20 MG tablet [id number] No Take 20 mg by mouth daily.  Provider, Historical, Mueller Taking Active Spouse/Significant Other  gabapentin (NEURONTIN) 100 MG capsule [id number] No Take 100 mg by mouth 2 (two) times daily. Take 2 pills twice a day Provider, Historical, Mueller Taking Active   hydrALAZINE (APRESOLINE) 25 MG tablet [id number] No Take 25 mg by mouth as needed. Provider, Historical, Mueller Taking Active   isosorbide mononitrate (IMDUR) 30 MG 24 hr tablet [id number] No Take 1 tablet (30 mg total) by mouth daily. William Mueller Taking Active   meclizine (ANTIVERT) 25 MG tablet [id number]  Take 1 tablet (25 mg total) by mouth 3 (three) times daily as needed for dizziness. William Mueller  Active   metoprolol succinate (TOPROL-XL) 25 MG 24 hr tablet [id number] No Take 1 tablet (25 mg total) by mouth daily. William Mueller Taking Active   nitroGLYCERIN (NITROSTAT) 0.4 MG SL tablet [id number] No Place 1 tablet (0.4 mg total) under the tongue every 5 (five) minutes as needed for chest pain. William Mueller Taking Active   omeprazole (PRILOSEC) 40 MG capsule [id number] No Take 1 capsule (40 mg total) by mouth daily.  Patient taking differently:  Take 40 mg by mouth daily. William Mueller   William Mueller Taking Active Spouse/Significant Other  OXYGEN [id number] No Inhale 2 L into the lungs daily as needed (oxygen).  Provider, Historical, Mueller  Taking Active Spouse/Significant Other           Med Note William Mueller   Wed Jan 18, 2018 12:45 PM) Wife reports patient using 3-4 liters at all times per recommendations from Cardiologist  pseudoephedrine (SUDAFED) 30 MG tablet [id number]  Take 1 tablet (30 mg total) by mouth every 6 (six) hours as needed for congestion. Sherrie George Mueller, Mueller  Active   roflumilast (DALIRESP) 500 MCG TABS tablet [id number] No Take 1 tablet (500 mcg total) by mouth daily.  Patient taking differently:  Take 500 mcg by mouth daily. LUNCH   William Mueller Taking Active Spouse/Significant Other  Spacer/Aero-Holding Josiah Lobo (AEROCHAMBER W/FLOWSIGNAL) inhaler [id number] No as needed. Provider, Historical, Mueller Taking Active   tamsulosin (FLOMAX) 0.4 MG CAPS capsule [id number] No Take 1 capsule (0.4 mg total) by mouth daily.  Patient not taking:  Reported on 05/01/2018   Hollice Espy, Mueller Not Taking Active Spouse/Significant Other           Med  Note Sadie Haber Nov 07, 2017  1:31 PM) Wife reports patient not taking any longer  tiotropium (SPIRIVA) 18 MCG inhalation capsule [id number] No Place 1 capsule (18 mcg total) into inhaler and inhale daily.  Patient taking differently:  Place 18 mcg into inhaler and inhale daily. William Mueller   William Mueller Taking Active Spouse/Significant Other  VENTOLIN HFA 108 (90 Base) MCG/ACT inhaler [id number] No INHALE 1 TO 2 PUFFS INTO THE LUNGS EVERY 6 HOURS AS NEEDED FOR WHEEZING OR SHORTNESS OF BREATH. William Mueller Taking Active Spouse/Significant Other          Assessment: Date Discharged from Hospital: 05/16/2018 Date Medication Reconciliation Performed: 05/30/2018  Medications:  New at Discharge: Marland Kitchen Meclizine . Pseudophedrine  Adjustments at Discharge: . None  Discontinued at Discharge:   None  Patient was recently discharged from hospital and all medications have been reviewed.  Drugs sorted by  system:  Neurologic/Psychologic:alprazolam, gabapentin, meclizine  Cardiovascular:aspirin '81mg'$ , atorvastatin, ezetimibe, furosemide, hydralazine, isosorbide mononitrate, metoprolol, SL NTG  Pulmonary/Allergy: albuterol INH + nebs, budesonide-formoterol, roflumilast, tiotropium pseudophedrine  Gastrointestinal:omeprazole  Pain: acetaminophen, cyclobenzaprine  Vitamins/Minerals/Supplements: CBD  Medication Review Findings:  . Pseudophedrine recently prescribed after ED visit.  Counseled on possible increased blood pressure with use, monitor closely, consider using coricidan product instead.     Medication Assistance Findings:  Medication assistance needs identified.   Extra Help:   '[]'$  Already receiving Full Extra Help  '[]'$  Already receiving Partial Extra Help  '[]'$  Eligible based on reported income and assets  '[x]'$  Not Eligible based on reported income and assets  Patient Assistance Programs: 1) Symbicort and Daliresp made by [employer]  o Income requirement met: '[x]'$  Yes '[]'$  No '[]'$  Unknown o Out-of-pocket prescription expenditure met:    '[]'$  Yes '[x]'$  No  '[]'$  Unknown  '[]'$  Not applicable  - Patient has not met application requirements to apply for this patient assistance program at this time.  Alternative option is to apply for [facility] (substitute for Symbicort) as there is no out-of-pocket expenditure. .   - No alternative option available for Daliresp.  Patient can continue to obtain this through insurance and hopefully will stay out of the coverage gap longer by avoiding inhaler co-pays if approved for Patient assistance programs.         2)  Zetia, Proventil made by [employer] o Income requirement met: '[x]'$  Yes '[]'$  No  '[]'$  Unknown o Out-of-pocket prescription expenditure met:   '[]'$  Yes '[]'$  No   '[]'$  Unknown '[x]'$  Not applicable - Patient has met application requirements to apply for this patient assistance program.           3)  Spiriva made by [place] o Income requirement met: '[x]'$  Yes  '[]'$  No  '[]'$  Unknown o Out-of-pocket prescription expenditure met:   '[]'$  Yes '[]'$  No   '[]'$  Unknown '[x]'$  Not applicable - Patient has met application requirements to apply for this patient assistance program.  Will clarify with William Mueller office if the office will assist with this in 2020 again or if [facility] will assist.   Plan: I will route patient assistance letter to [place] technician who will coordinate patient assistance program application process for medications listed above.  [facility] pharmacy technician will assist with obtaining all required documents from both patient and provider(s) and submit application(s) once completed.    Call placed to William Mueller office regarding Ruthe Mannan substitution and to clarify Spiriva Patient assistance program  Ralene Bathe, PharmD, BCPS Clinical Pharmacist  [place] [phone]

## 2018-05-30 NOTE — Patient Outreach (Signed)
[name] Care Management  05/30/2018  William Mueller 1950/06/12 [id number]                                                  Medication Assistance Referral  Referral From: [place]: Ruthe Mannan / [employer] Patient application portion:  Mailed Provider application portion:  Mailed to Dr. Raul Del  Medication/Company: Proventil HFA / [employer] Patient application portion:  Mailed Provider application portion:  Mailed to Dr. Raul Del  Medication/Company: Stann Ore / [employer] Patient application portion:  Mailed Provider application portion: Faxed  to Dr. Raul Del  Medication/Company: Chauncey Cruel / [employer] Patient application portion:  Mailed Provider application portion:  Mailed to L. Lam, FNP  Follow up:  Will follow up with patient in 7-10 business days to confirm application(s) have been received.  [name] [place] Certified Pharmacy Technician [place] Management Direct [phone]

## 2018-06-07 DIAGNOSIS — J449 Chronic obstructive pulmonary disease, unspecified: Secondary | ICD-10-CM | POA: Diagnosis not present

## 2018-06-08 ENCOUNTER — Other Ambulatory Visit: Payer: Self-pay

## 2018-06-08 ENCOUNTER — Emergency Department: Payer: PPO

## 2018-06-08 ENCOUNTER — Emergency Department
Admission: EM | Admit: 2018-06-08 | Discharge: 2018-06-08 | Disposition: A | Discharge status: Home / Self Care | Payer: PPO | Attending: Emergency Medicine | Admitting: Emergency Medicine

## 2018-06-08 ENCOUNTER — Encounter: Payer: Self-pay | Admitting: Emergency Medicine

## 2018-06-08 DIAGNOSIS — J40 Bronchitis, not specified as acute or chronic: Secondary | ICD-10-CM | POA: Insufficient documentation

## 2018-06-08 DIAGNOSIS — I25110 Atherosclerotic heart disease of native coronary artery with unstable angina pectoris: Secondary | ICD-10-CM | POA: Diagnosis not present

## 2018-06-08 DIAGNOSIS — Z7982 Long term (current) use of aspirin: Secondary | ICD-10-CM | POA: Insufficient documentation

## 2018-06-08 DIAGNOSIS — J441 Chronic obstructive pulmonary disease with (acute) exacerbation: Secondary | ICD-10-CM

## 2018-06-08 DIAGNOSIS — I5032 Chronic diastolic (congestive) heart failure: Secondary | ICD-10-CM | POA: Insufficient documentation

## 2018-06-08 DIAGNOSIS — I130 Hypertensive heart and chronic kidney disease with heart failure and stage 1 through stage 4 chronic kidney disease, or unspecified chronic kidney disease: Secondary | ICD-10-CM | POA: Insufficient documentation

## 2018-06-08 DIAGNOSIS — Z9119 Patient's noncompliance with other medical treatment and regimen: Secondary | ICD-10-CM | POA: Diagnosis not present

## 2018-06-08 DIAGNOSIS — Z79899 Other long term (current) drug therapy: Secondary | ICD-10-CM | POA: Diagnosis not present

## 2018-06-08 DIAGNOSIS — J9809 Other diseases of bronchus, not elsewhere classified: Secondary | ICD-10-CM | POA: Diagnosis not present

## 2018-06-08 DIAGNOSIS — Z87891 Personal history of nicotine dependence: Secondary | ICD-10-CM | POA: Insufficient documentation

## 2018-06-08 DIAGNOSIS — I10 Essential (primary) hypertension: Secondary | ICD-10-CM | POA: Diagnosis not present

## 2018-06-08 DIAGNOSIS — R05 Cough: Secondary | ICD-10-CM | POA: Diagnosis present

## 2018-06-08 DIAGNOSIS — I252 Old myocardial infarction: Secondary | ICD-10-CM | POA: Diagnosis not present

## 2018-06-08 DIAGNOSIS — N183 Chronic kidney disease, stage 3 (moderate): Secondary | ICD-10-CM | POA: Diagnosis not present

## 2018-06-08 LAB — BASIC METABOLIC PANEL
Anion gap: 10 (ref 5–15)
BUN: 17 mg/dL (ref 8–23)
CO2: 28 mmol/L (ref 22–32)
Calcium: 8.6 mg/dL — ABNORMAL LOW (ref 8.9–10.3)
Chloride: 97 mmol/L — ABNORMAL LOW (ref 98–111)
Creatinine, Ser: 1.6 mg/dL — ABNORMAL HIGH (ref 0.61–1.24)
GFR calc Af Amer: 51 mL/min — ABNORMAL LOW (ref >60)
GFR calc non Af Amer: 44 mL/min — ABNORMAL LOW (ref >60)
Glucose, Bld: 141 mg/dL — ABNORMAL HIGH (ref 70–99)
POTASSIUM: 3.4 mmol/L — AB (ref 3.5–5.1)
Sodium: 135 mmol/L (ref 135–145)

## 2018-06-08 LAB — CBC
HCT: 40.9 % (ref 39.0–52.0)
Hemoglobin: 12.9 g/dL — ABNORMAL LOW (ref 13.0–17.0)
MCH: 27.7 pg (ref 26.0–34.0)
MCHC: 31.5 g/dL (ref 30.0–36.0)
MCV: 87.8 fL (ref 80.0–100.0)
Platelets: 297 10*3/uL (ref 150–400)
RBC: 4.66 MIL/uL (ref 4.22–5.81)
RDW: 12.2 % (ref 11.5–15.5)
WBC: 11.7 10*3/uL — ABNORMAL HIGH (ref 4.0–10.5)
nRBC: 0 % (ref 0.0–0.2)

## 2018-06-08 LAB — TROPONIN I: Troponin I: <0.03 ng/mL (ref <0.03)

## 2018-06-08 MED ORDER — AZITHROMYCIN 250 MG PO TABS: ORAL_TABLET | ORAL | 0 refills | Status: DC

## 2018-06-08 MED ORDER — AZITHROMYCIN 500 MG PO TABS
500.0000 mg | ORAL_TABLET | Freq: Once | ORAL | Status: AC
  Administered 2018-06-08: 500 mg via ORAL
  Filled 2018-06-08: qty 1

## 2018-06-08 MED ORDER — IPRATROPIUM-ALBUTEROL 0.5-2.5 (3) MG/3ML IN SOLN
3.0000 mL | Freq: Once | RESPIRATORY_TRACT | Status: AC
  Administered 2018-06-08: 3 mL via RESPIRATORY_TRACT
  Filled 2018-06-08: qty 3

## 2018-06-08 NOTE — ED Provider Notes (Addendum)
[facility] Emergency Department Provider Note  ____________________________________________   I have reviewed the triage vital signs and the nursing notes. Where available I have reviewed prior notes and, if possible and indicated, outside hospital notes.    HISTORY  Chief Complaint Shortness of Breath and Cough    HPI William Mueller is a 68 y.o. male COPD who is on oxygen and has had a productive cough for the last week.  No fever no URI symptoms since the beginning, had his pulmonologist: Some steroids which he is taking but it has not significantly reduced his cough.  He denies any leg swelling chest pain or shortness of breath.  He states that the cough is persistent and irritating.  He has not had increase his oxygen.  He is on Symbicort, he is not taking his albuterol however, he states it did not cross his mind to do so.    Past Medical History:  Diagnosis Date  . Anginal pain ([place]) 06/2017  . Anxiety   . Bronchitis 06/2017  . Chronic chest pain   . Chronic kidney disease (CKD) stage G3b/A1, moderately decreased glomerular filtration rate (GFR) between 30-44 mL/min/1.73 square meter and albuminuria creatinine ratio less than 30 mg/g (HCC)   . Chronic lower back pain    WITH LEG WEAKNESS  . COPD (chronic obstructive pulmonary disease) (HCC)    EMPHYSEMA, O2 AT 2L PRN  . Coronary artery disease    a. 12/2013 PCI: mRCA 100% with L to R collats s/p PCI/DES;  b. 06/2014 MV: no ischemia/infarct, EF 53%;  c. 03/2015 Cath: LM nl, LAD nl, D1 80 (1.37mm), LCX 75m (<1.48mm), OM1 nl, RCA 55p, patent stent, RPDA nl, EF 65%; d. 09/2015 Cath: LM nl, LAD 59m, D1 80 (<10mm), LCX 37m/d (<1.88mm), OM1 nl, RCA 55p (FFR 0.93), patent stent, RPDA nl-->Med Rx.  . Daily headache   . Depression   . Diastolic dysfunction    a. echo 12/2013: EF 55-60%, mild LVH, GR1DD, inf HK, elevated CVP, mildly dilated IVC suggestive of increased RA pressure  . Dyspnea    WHEEZING  . GERD  (gastroesophageal reflux disease)    REFLUX  . Hearing loss   . High cholesterol   . Hypertension   . Myocardial infarction ([place]) 2015  . Nicotine addiction    a. using eCigs.  . Orthopnea   . Sleep apnea     Patient Active Problem List   Diagnosis Date Noted  . Chest pain 09/30/2017  . Hypokalemia 09/29/2017  . Near syncope 09/29/2017  . Orthostatic hypotension 09/29/2017  . Nicotine addiction   . CAD S/P PCI DES to RCA 03/11/2015  . Diastolic dysfunction   . Chronic chest pain   . Chest pain at rest 11/23/2014  . GERD (gastroesophageal reflux disease) 06/08/2014  . CKD (chronic kidney disease) stage 3, GFR 30-59 ml/min (HCC) 05/04/2014  . Bilateral leg pain 05/04/2014  . Lung nodule < 6cm on CT 05/04/2014  . Abnormal CT scan, kidney 05/04/2014  . Unstable angina ([place]) 05/04/2014  . Atypical chest pain 03/22/2014  . Atrial tachycardia, paroxysmal ([place]) 12/14/2013  . Atherosclerosis of native coronary artery with unstable angina pectoris ([place]) 12/13/2013  . NSTEMI (non-ST elevated myocardial infarction) ([place]) 12/13/2013  . Labile essential hypertension 12/11/2013  . Acute renal failure superimposed on stage 3 chronic kidney disease ([place]) 12/11/2013  . Hyperlipidemia 12/11/2013  . COPD (chronic obstructive pulmonary disease) ([place]) 12/11/2013    Past Surgical History:  Procedure Laterality Date  .  CARDIAC CATHETERIZATION     MC x 1 stent  . CARDIAC CATHETERIZATION Left 03/12/2015   Procedure: Left Heart Cath and Coronary Angiography;  Surgeon: Leonie Man, MD;  Location: [place] [facility];  Service: Cardiovascular;  Laterality: Left;  . CARDIAC CATHETERIZATION N/A 09/29/2015   Procedure: Left Heart Cath and Coronary Angiography;  Surgeon: Wellington Hampshire, MD;  Location: [place] [facility];  Service: Cardiovascular;  Laterality: N/A;  . CATARACT EXTRACTION W/PHACO Right 08/10/2017   Procedure: CATARACT EXTRACTION PHACO AND INTRAOCULAR LENS PLACEMENT ([place]) RIGHT;   Surgeon: Leandrew Koyanagi, MD;  Location: [place];  Service: Ophthalmology;  Laterality: Right;  . CORONARY ANGIOPLASTY     STENT PLACEMENT  . LEFT HEART CATHETERIZATION WITH CORONARY ANGIOGRAM N/A 12/12/2013   Procedure: LEFT HEART CATHETERIZATION WITH CORONARY ANGIOGRAM;  Surgeon: Wellington Hampshire, MD;  Location: [place] CATH LAB;  Service: Cardiovascular;  Laterality: N/A;    Prior to Admission medications   Medication Sig Start Date End Date Taking? Authorizing Provider  acetaminophen (TYLENOL) 500 MG tablet Take 1,000 mg by mouth daily as needed for headache.     [provider]  albuterol (PROVENTIL HFA;VENTOLIN HFA) 108 (90 Base) MCG/ACT inhaler Inhale into the lungs every 6 (six) hours as needed for wheezing or shortness of breath.    [provider]  albuterol (PROVENTIL) (2.5 MG/3ML) 0.083% nebulizer solution Take 3 mLs (2.5 mg total) by nebulization every 6 (six) hours as needed for wheezing or shortness of breath. 06/12/17   Johnn Hai, PA-C  ALPRAZolam (XANAX XR) 3 MG 24 hr tablet Take 3 mg by mouth 2 (two) times daily. 1 MG IN THE MORNING AND 3 MG IN THE EVENING    [provider]  ALPRAZolam (XANAX) 1 MG tablet Take 1 mg by mouth daily.  02/15/18   [provider]  aspirin 81 MG EC tablet Take 1 tablet (81 mg total) by mouth daily. Patient taking differently: Take 81 mg by mouth daily. BREAKFAST 09/04/15   Minna Merritts, MD  atorvastatin (LIPITOR) 80 MG tablet TAKE 1 TABLET BY MOUTH DAILY 12/12/17   Minna Merritts, MD  budesonide-formoterol [facility] 160-4.5 MCG/ACT inhaler Inhale 2 puffs into the lungs 2 (two) times daily. Patient taking differently: Inhale 2 puffs into the lungs 2 (two) times daily. BREAKFAST AND BEDTIME 09/04/15   Laverle Hobby, MD  Cannabidiol 100 MG/ML SOLN Take 4 mLs by mouth 2 (two) times daily. 4 drops under the tongue BID    [provider]  cyclobenzaprine (FLEXERIL) 10 MG tablet Take  10 mg by mouth 3 (three) times daily as needed for muscle spasms.    [provider]  ezetimibe (ZETIA) 10 MG tablet Take 1 tablet (10 mg total) by mouth daily. Patient taking differently: Take 10 mg by mouth daily. DINNER 07/01/17   Minna Merritts, MD  furosemide (LASIX) 20 MG tablet Take 20 mg by mouth daily.     [provider]  gabapentin (NEURONTIN) 100 MG capsule Take 300 mg by mouth 2 (two) times daily. Take 2 pills twice a day     [provider]  hydrALAZINE (APRESOLINE) 25 MG tablet Take 25 mg by mouth as needed.    [provider]  isosorbide mononitrate (IMDUR) 30 MG 24 hr tablet Take 1 tablet (30 mg total) by mouth daily. 01/16/18   Minna Merritts, MD  meclizine (ANTIVERT) 25 MG tablet Take 1 tablet (25 mg total) by mouth 3 (  three) times daily as needed for dizziness. 05/16/18   Triplett, Johnette Abraham B, FNP  metoprolol succinate (TOPROL-XL) 25 MG 24 hr tablet Take 1 tablet (25 mg total) by mouth daily. 11/16/17   Minna Merritts, MD  nitroGLYCERIN (NITROSTAT) 0.4 MG SL tablet Place 1 tablet (0.4 mg total) under the tongue every 5 (five) minutes as needed for chest pain. 01/16/18   Minna Merritts, MD  omeprazole (PRILOSEC) 40 MG capsule Take 1 capsule (40 mg total) by mouth daily. Patient taking differently: Take 40 mg by mouth daily. AM 06/28/16   Minna Merritts, MD  OXYGEN Inhale 2 L into the lungs daily as needed (oxygen).     [provider]  pseudoephedrine (SUDAFED) 30 MG tablet Take 1 tablet (30 mg total) by mouth every 6 (six) hours as needed for congestion. Patient not taking: Reported on 05/30/2018 05/16/18 05/16/19  Sherrie George B, FNP  roflumilast (DALIRESP) 500 MCG TABS tablet Take 1 tablet (500 mcg total) by mouth daily. Patient taking differently: Take 500 mcg by mouth daily. LUNCH 09/04/15   Laverle Hobby, MD  Spacer/Aero-Holding Chambers (AEROCHAMBER W/FLOWSIGNAL) inhaler as needed. 06/21/17 06/21/18  [provider]  tiotropium (SPIRIVA) 18 MCG inhalation capsule Place 1 capsule (18 mcg total) into inhaler and inhale daily. Patient taking differently: Place 18 mcg into inhaler and inhale daily. AM 09/04/15   Laverle Hobby, MD    Allergies Paroxetine hcl; Serotonin reuptake inhibitors (ssris); Diazepam; Doxycycline monohydrate; Escitalopram oxalate; Lorazepam; and Tetracyclines & related  Family History  Problem Relation Age of Onset  . Coronary artery disease Brother   . Heart disease Brother 22  . Heart disease Father        MI x 8  . Kidney disease Neg Hx   . Prostate cancer Neg Hx     Social History Social History   Tobacco Use  . Smoking status: Former Smoker    Years: 48.00    Types: E-cigarettes    Last attempt to quit: 06/07/2009    Years since quitting: 9.0  . Smokeless tobacco: Never Used  . Tobacco comment: Using electronic cigarette  Substance Use Topics  . Alcohol use: Yes    Alcohol/week: 6.0 standard drinks    Types: 6 Cans of beer per week    Comment: Occasional   . Drug use: No    Review of Systems Constitutional: No fever/chills Eyes: No visual changes. ENT: No sore throat. No stiff neck no neck pain Cardiovascular: Denies chest pain. Respiratory: Denies shortness of breath positive cough Gastrointestinal:   no vomiting.  No diarrhea.  No constipation. Genitourinary: Negative for dysuria. Musculoskeletal: Negative lower extremity swelling Skin: Negative for rash. Neurological: Negative for severe headaches, focal weakness or numbness.   ____________________________________________   PHYSICAL EXAM:  VITAL SIGNS: ED Triage Vitals  Enc Vitals Group     BP 06/08/18 1223 138/75     Pulse Rate 06/08/18 1223 (!) 102     Resp 06/08/18 1223 (!) 24     Temp 06/08/18 1223 97.8 F (36.6 C)     Temp Source 06/08/18 1223 Oral     SpO2 06/08/18 1223 99 %     Weight 06/08/18 1223 205 lb (93 kg)     Height 06/08/18 1223 5\' 9"  (1.753 m)      Head Circumference --      [place] Flow --      Pain Score 06/08/18 1234 0     Pain Loc --  Pain Edu? --      Excl. in [place]? --     Constitutional: Alert and oriented. Well appearing and in no acute distress. Eyes: Conjunctivae are normal Head: Atraumatic HEENT: No congestion/rhinnorhea. Mucous membranes are moist.  Oropharynx non-erythematous Neck:   Nontender with no meningismus, no masses, no stridor Cardiovascular: Normal rate, regular rhythm. Grossly normal heart sounds.  Good peripheral circulation. Respiratory: Normal respiratory effort.  No retractions. Lungs CTAB.  Not appreciate rales or rhonchi and is good good air movement Abdominal: Soft and nontender. No distention. No guarding no rebound Back:  There is no focal tenderness or step off.  there is no midline tenderness there are no lesions noted. there is no CVA tenderness  Musculoskeletal: No lower extremity tenderness, no upper extremity tenderness. No joint effusions, no DVT signs strong distal pulses no edema Neurologic:  Normal speech and language. No gross focal neurologic deficits are appreciated.  Skin:  Skin is warm, dry and intact. No rash noted. Psychiatric: Mood and affect are normal. Speech and behavior are normal.  ____________________________________________   LABS (all labs ordered are listed, but only abnormal results are displayed)  Labs Reviewed  BASIC METABOLIC PANEL - Abnormal; Notable for the following components:      Result Value   Potassium 3.4 (*)    Chloride 97 (*)    Glucose, Bld 141 (*)    Creatinine, Ser 1.60 (*)    Calcium 8.6 (*)    GFR calc non Af Amer 44 (*)    GFR calc Af Amer 51 (*)    All other components within normal limits  CBC - Abnormal; Notable for the following components:   WBC 11.7 (*)    Hemoglobin 12.9 (*)    All other components within normal limits  TROPONIN I    Pertinent labs  results that were available during my care of the patient were reviewed by me and  considered in my medical decision making (see chart for details). ____________________________________________  EKG  I personally interpreted any EKGs ordered by me or triage Notes rhythm rate 105, no acute ST elevation depression nonspecific ST changes.  Normal axis. ____________________________________________  RADIOLOGY  Pertinent labs & imaging results that were available during my care of the patient were reviewed by me and considered in my medical decision making (see chart for details). If possible, patient and/or family made aware of any abnormal findings.  Dg Chest 2 View  Result Date: 06/08/2018 CLINICAL DATA:  One-week history of cough and shortness of breath, unrelieved with corticosteroid therapy. EXAM: CHEST - 2 VIEW COMPARISON:  05/15/2018 and earlier, including CTA chest 09/29/2017. FINDINGS: Cardiomediastinal silhouette unremarkable and unchanged. Stable hyperinflation. Mild central peribronchial thickening, unchanged from prior examinations. Lungs otherwise clear. No localized airspace consolidation. No pleural effusions. No pneumothorax. Normal pulmonary vascularity. Opacities overlying the lung bases on the PA image are due to the patient's overlying breast/pectoralis muscle tissue. Visualized bony thorax intact. IMPRESSION: Stable mild changes of chronic bronchitis and/or asthma. No acute cardiopulmonary disease. Electronically Signed   By: Evangeline Dakin M.D.   On: 06/08/2018 13:07   ____________________________________________    PROCEDURES  Procedure(s) performed: None  Procedures  Critical Care performed: None  ____________________________________________   INITIAL IMPRESSION / ASSESSMENT AND PLAN / ED COURSE  Pertinent labs & imaging results that were available during my care of the patient were reviewed by me and considered in my medical decision making (see chart for details).  She has not markedly ill-appearing he  has had a cough for the last couple  days to a week.  No fevers.  Vital signs are reassuring, lungs actually do not sound bad to me, it may be a slight wheeze but very faint and he is moving pretty good air.  He is a little diminished in the bases, we will give him albuterol here as he has not taken it.  It is very delicate respiratory status, and the fact that he has a productive cough, we will give him antibiotics as this may help him.  He could certainly have an atypical or subclinical infection.  In addition, x-ray shows no evidence of acute pathology nothing to suggest ACS PE or dissection, renal function is preserved at its baseline, and he is otherwise well-appearing.  I have suggested that he actually uses albuterol at home which I think will help him.  We will see if he feels better after albuterol here.  He does not meet criteria for admission and does not want to stay  ----------------------------------------- 4:13 PM on 06/08/2018 -----------------------------------------  His lungs are clear at this time, he is eager to go home.  I have encouraged him to use his albuterol and continue take the steroids given to him by his primary care doctor/pulmonologist.  Patient I think would benefit greatly from the actual use of his albuterol.  I have made this clear to him.  At this time I do not think he has CHF, E, dissection or any other acute pathology of that variety, return precautions follow-up have been given and understood.    ____________________________________________   FINAL CLINICAL IMPRESSION(S) / ED DIAGNOSES  Final diagnoses:  None      This chart was dictated using voice recognition software.  Despite best efforts to proofread,  errors can occur which can change meaning.      Schuyler Amor, MD 06/08/18 1441    Schuyler Amor, MD 06/08/18 [id number]

## 2018-06-08 NOTE — ED Triage Notes (Signed)
Says sick for a week with cough and short of breath.  On prednisone from dr Raul Del.  Not getting better.  No fever.

## 2018-06-08 NOTE — Discharge Instructions (Signed)
Actually use your albuterol every 4 hours as directed I think this will greatly help your breathing.  Continue taking the steroids given to you by her primary care/pulmonologist, take the antibiotics until they are gone, if you have increased trouble breathing, fever, chest pain or you feel worse in any way return to the emergency department.

## 2018-06-09 ENCOUNTER — Other Ambulatory Visit: Payer: Self-pay | Admitting: *Deleted

## 2018-06-09 ENCOUNTER — Encounter: Payer: Self-pay | Admitting: *Deleted

## 2018-06-09 NOTE — Patient Outreach (Signed)
[facility]) Care Management  06/09/2018  William Mueller 04-04-1951 [id number]   [place] ED Outreach  Referral Date:07/22/2017 Referral Source:Nurse Line Reason for Referral:Lump to left side of neck that has been there for 1 month comes and goes. Causing problems with swallowing Insurance:[employer]   Outreach Attempt:  Successful telephone outreach to patient and wife for post emergency room follow up.  HIPAA verified with wife, per patient request.  Wife states patient had an increase in cough over the past week, denies fever or increase in home oxygen need.  She initially spoke with Pulmonologist office for prednisone taper on 06/05/2018, and prednisone did not help.  States she contacted [facility] 24 hour nurse line and was instructed to go to the emergency room.  Wife reports patient was diagnosed with chronic bronchitis, given a albuterol nebulizer treatment and dose of antibiotics and felt better.  States he was instructed by ED physician to use his albuterol rescue inhaler about every 4 hours and prescribed po antibiotics.  Discussed with patient and wife signs and symptoms of COPD and reviewed COPD action plan.  Encouraged patient to utilize his albuterol nebulizer for sustained cough and shortness of breath when the inhaler doesn't seem to be helping.  Also discussed with patient and wife importance of completion of prednisone and antibiotics.  Reports compliance with his oxygen and his maintenance inhalers Spiriva and Symbicort.  Discussed with wife and patient if he does not feel back to baseline by end of weekend to call Monday morning to Pulmonologist or primary care for appointment to be seen as soon as possible.  Appointments:  Encouraged patient to schedule follow up appointments with Pulmonologist, Cardiologist, and primary care provider for recent emergency room visits.  Plan: RN Health Coach will send patient COPD Action Plan  Packet. RN Health Coach will make next telephone outreach to patient within the month of February.  [place] [phone] [email or web address]

## 2018-06-22 DIAGNOSIS — H903 Sensorineural hearing loss, bilateral: Secondary | ICD-10-CM | POA: Diagnosis not present

## 2018-06-23 ENCOUNTER — Other Ambulatory Visit: Payer: Self-pay | Admitting: Pharmacy Technician

## 2018-06-23 NOTE — Patient Outreach (Signed)
[facility]) Care Management  06/23/2018  William Mueller Aug 23, 1950 [id number]   Received patient portion(s) of patient assistance application for Spiriva, Zetia, Dulera and Proventil HFA . Faxed completed application and required documents into [employer] for Spiriva.  Will mail [employer] application once provider portion has been received.  Will follow up with company in 7-10 business days to check status of Spiriva application.  [name] [place] Certified Pharmacy Technician [place] Management Direct Dial:[phone]

## 2018-07-04 ENCOUNTER — Other Ambulatory Visit: Payer: Self-pay | Admitting: Cardiovascular Disease

## 2018-07-08 DIAGNOSIS — J449 Chronic obstructive pulmonary disease, unspecified: Secondary | ICD-10-CM | POA: Diagnosis not present

## 2018-07-14 ENCOUNTER — Other Ambulatory Visit: Payer: Self-pay | Admitting: Pharmacy Technician

## 2018-07-14 NOTE — Patient Outreach (Signed)
[facility]) Care Management  07/14/2018  KAYMEN ADRIAN 04-Sep-1950 [id number]    Follow up call placed to [employer] regarding patient assistance application(s) for Waynard [place] states that patient has been denied due to being over income . She states that he is eligible to appeal the decision by submitting letter of hardship and printout showing co-pay for medication.  Will follow up with patient in 2-3 business days to verify if he would like to start the appeal process.  [name] [place] Certified Pharmacy Technician [place] Management Direct Dial:[phone]

## 2018-07-21 ENCOUNTER — Other Ambulatory Visit: Payer: Self-pay | Admitting: Pharmacy Technician

## 2018-07-21 ENCOUNTER — Encounter: Payer: Self-pay | Admitting: Pharmacy Technician

## 2018-07-21 NOTE — Patient Outreach (Signed)
[facility]) Care Management  07/21/2018  William Mueller [birth year] [id number]   Incoming call from patients wife, [name] stating that she recently picked up [facility] from pharmacy and 1 month supply of medication was $90. Informed her that we were doing patient assistance for the [facility] however we have not received Dr. Leane Platt portion of application. Informed her that I had outreached office and they had called me back yesterday (2/13) after I had left office. Informed her I would be calling them back about provider portion of application.   We also discussed sending appeal letter to [employer] to have Spiriva re-evaluated. Prepared appeal letter and another copy of [employer] application to be mailed to patient.  Contacted Dr. Leane Platt office and spoke to [employer] Investment banker, corporate) and she states that the mailed application back about 2-3 weeks ago. Informed her that it had not been received and faxed blank copy of application with return mailing address included.  Will follow up with [name] in 2-3 business days to update. She states she goes to work daily at [employer] pm  [employer]. [name] [place] Certified Pharmacy Technician [place] Management Direct Dial:[phone]

## 2018-07-25 ENCOUNTER — Encounter: Payer: Self-pay | Admitting: *Deleted

## 2018-07-25 ENCOUNTER — Other Ambulatory Visit: Payer: Self-pay | Admitting: *Deleted

## 2018-07-25 NOTE — Patient Outreach (Addendum)
[place] [facility]) Care Management  [name]  07/25/2018   William Mueller 18-Dec-1950 [id number]   [name]   Referral Date:  07/22/2017 Referral Source:  Nurse Line Reason for Referral:  Lump to left side of neck that has been there for 1 month comes and goes.  Causing problems with swallowing Insurance:  Health Team Advantage   Outreach Attempt:  Successful telephone outreach to patient and wife for quarterly follow up.  HIPAA verified with patient's wife, per patient request (just laid down to take a nap).  Wife stating patient is doing ok.  Has recently had an ear screening and was told he has 80-90% hearing loss in both ears.  He has obtained hearing aids and is starting to wear them.  Denies any increase in shortness of breath or any recent sick days.  States patient has not had to use his rescue inhaler in the last few weeks.  Wife is reporting patient is expressing some feelings of depression and does not like to leave the house often.  Discussed [name] referral for depression resources and encouraged wife to discuss with patient.  Requested wife or patient notify this [name] if patient agrees to [place] Work referral.  Encounter Medications:  Outpatient Encounter Medications as of 07/25/2018  Medication Sig Note  . acetaminophen (TYLENOL) 500 MG tablet Take 1,000 mg by mouth daily as needed for headache.    . albuterol (PROVENTIL HFA;VENTOLIN HFA) 108 (90 Base) MCG/ACT inhaler Inhale into the lungs every 6 (six) hours as needed for wheezing or shortness of breath.   Marland Kitchen albuterol (PROVENTIL) (2.5 MG/3ML) 0.083% nebulizer solution Take 3 mLs (2.5 mg total) by nebulization every 6 (six) hours as needed for wheezing or shortness of breath.   . ALPRAZolam (XANAX XR) 3 MG 24 hr tablet Take 3 mg by mouth 2 (two) times daily. 1 MG IN THE MORNING AND 3 MG IN THE EVENING 09/28/2017: Patient and wife reports taking 1 mg in am and 3  mg in the evening  . ALPRAZolam (XANAX) 1 MG tablet Take 1 mg by mouth daily.    Marland Kitchen aspirin 81 MG EC tablet Take 1 tablet (81 mg total) by mouth daily. (Patient taking differently: Take 81 mg by mouth daily. BREAKFAST)   . atorvastatin (LIPITOR) 80 MG tablet TAKE 1 TABLET BY MOUTH DAILY   . cyclobenzaprine (FLEXERIL) 10 MG tablet Take 10 mg by mouth 3 (three) times daily as needed for muscle spasms.   Marland Kitchen ezetimibe (ZETIA) 10 MG tablet Take 1 tablet (10 mg total) by mouth daily.   . furosemide (LASIX) 20 MG tablet Take 20 mg by mouth daily.    Marland Kitchen gabapentin (NEURONTIN) 100 MG capsule Take 300 mg by mouth 2 (two) times daily. Take 2 pills twice a day    . hydrALAZINE (APRESOLINE) 25 MG tablet Take 25 mg by mouth as needed.   . isosorbide mononitrate (IMDUR) 30 MG 24 hr tablet Take 1 tablet (30 mg total) by mouth daily.   . metoprolol succinate (TOPROL-XL) 25 MG 24 hr tablet Take 1 tablet (25 mg total) by mouth daily.   . mometasone-formoterol (DULERA) 100-5 MCG/ACT AERO Inhale into the lungs.   . nitroGLYCERIN (NITROSTAT) 0.4 MG SL tablet Place 1 tablet (0.4 mg total) under the tongue every 5 (five) minutes as needed for chest pain.   Marland Kitchen omeprazole (PRILOSEC) 40 MG capsule Take 1 capsule (40 mg total) by mouth  daily. (Patient taking differently: Take 40 mg by mouth daily. AM)   . OXYGEN Inhale 2 L into the lungs daily as needed (oxygen).  01/18/2018: Wife reports patient using 3-4 liters at all times per recommendations from Cardiologist  . roflumilast (DALIRESP) 500 MCG TABS tablet Take 1 tablet (500 mcg total) by mouth daily. (Patient taking differently: Take 500 mcg by mouth daily. LUNCH)   . tiotropium (SPIRIVA) 18 MCG inhalation capsule Place 1 capsule (18 mcg total) into inhaler and inhale daily. (Patient taking differently: Place 18 mcg into inhaler and inhale daily. AM)   . azithromycin (ZITHROMAX Z-PAK) 250 MG tablet Take 2 tablets (500 mg) on  Day 1,  followed by 1 tablet (250 mg) once daily on  Days 2 through 5. (Patient not taking: Reported on 07/25/2018) 07/25/2018: Completed  . budesonide-formoterol (SYMBICORT) 160-4.5 MCG/ACT inhaler Inhale 2 puffs into the lungs 2 (two) times daily. (Patient not taking: Reported on 07/25/2018) 07/25/2018: Changed to [name]  . Cannabidiol 100 MG/ML SOLN Take 4 mLs by mouth 2 (two) times daily. 4 drops under the tongue BID 07/25/2018: Reports not taking  . meclizine (ANTIVERT) 25 MG tablet Take 1 tablet (25 mg total) by mouth 3 (three) times daily as needed for dizziness. (Patient not taking: Reported on 07/25/2018)   . pseudoephedrine (SUDAFED) 30 MG tablet Take 1 tablet (30 mg total) by mouth every 6 (six) hours as needed for congestion. (Patient not taking: Reported on 05/30/2018)    No facility-administered encounter medications on file as of 07/25/2018.     Functional Status:  In your present state of health, do you have any difficulty performing the following activities: 07/25/2018 06/09/2018  Hearing? [name]  Comment hard of hearing in both ears, recent obtained new hearing aides 2 weeks ago hard of hearing in left ear  Vision? - Y  Comment - right eye cataract  Difficulty concentrating or making decisions? - N  Walking or climbing stairs? - Y  Comment - due to shortness of breath and back pain  Dressing or bathing? - N  Doing errands, shopping? - Y  Preparing Food and eating ? - Y  Using the Toilet? - N  In the past six months, have you accidently leaked urine? - N  Do you have problems with loss of bowel control? - N  Managing your Medications? - Y  Managing your Finances? - N  Housekeeping or managing your Housekeeping? - Y  Some recent data might be hidden    Fall/Depression Screening: Fall Risk  07/25/2018 05/01/2018 02/24/2018  Falls in the past year? 0 0 No  Risk for fall due to : Impaired vision;Medication side effect;Impaired balance/gait - -  Follow up Falls evaluation completed;Falls prevention discussed;Education provided - -    PHQ 2/9 Scores 08/03/2017 07/22/2017  PHQ - 2 Score 0 2  PHQ- 9 Score - 14   THN CM Care Plan Problem One     Most Recent Value  Care Plan Problem One  Knowledge defeciet related to self care management of COPD and attending medical follow up appointments.  Role Documenting the Problem One  [place] for Problem One  Active  [facility] Long Term Goal   Patient will report no emergency room visits or hospitalizations in the next 90 days.  [facility] Long Term Goal Start Date  06/09/18  Interventions for Problem One Long Term Goal  Discussed and reviewed care plan and goals with wife, reviewed medications indications and encouraged compliace,  discussed importance of use of rescue inhaler and nebulizer with increased shortness of breath, discussed with wife patient's feelings of depression and [facility] Social Work resources/encuraged wife to discuss resources with patient and to notify this RN Psychologist, educational if patient agreeable to referral  [facility] CM Short Term Goal #1   Patient will attend scheduled appointment with Lam NP on 08/02/2018.  TH[facility] CM Short Term Goal #1 Start Date  07/25/18  [facility] CM Short Term Goal #1 Met Date  07/25/18  Interventions for Short Term Goal #1  Confirmed patient has scheduled appointment with primary care provider, encouraged to attend scheduled appointment, confirmed patient has scheduled pulmonary follow up appointment, encouraged to attend pulmonology appointment, discussed importance of medical follow up appointments  [facility] CM Short Term Goal #2   Patient will report scheduling and attending cardiology appointment within the next 60 days.  THN CM Short Term Goal #2 Start Date  07/25/18  Interventions for Short Term Goal #2  Discussed with wife importance of cardiology follow up, discussed reason for cardiology follow up with recent syncopal episode, reminded wife of patient cancelling scheduled cardiology appointment in December, encouraged wife to discuss and encourage patient to  reschedule and attend missed cardiology appointment     Appointments:  Patient has scheduled appointment with Charlott Holler NP on 08/02/2018 and Dr. Raul Del with Pulmonology on 08/10/2018.  Encouraged to keep and attend scheduled appointments.  Plan: RN Health Coach will send primary care provider Quarterly Update. RN Health Coach will send patient Ecologist. RN Health Coach will make next telephone outreach to patient within the month of April.  [place] [phone] [email or web address]

## 2018-08-03 ENCOUNTER — Other Ambulatory Visit: Payer: Self-pay | Admitting: Pharmacy Technician

## 2018-08-03 NOTE — Patient Outreach (Signed)
[place] [facility]) Care Management  08/03/2018  William Mueller [birth year] [id number]   Received provider portion(s) of patient assistance application for [facility] and Proventil HFA. Prepared to mail completed application and required documents into [employer].  Will follow up with company in 14-21 business days to check status of application.  [name] [place] Certified Pharmacy Technician [place] Management Direct Dial:[phone]

## 2018-08-04 NOTE — Patient Outreach (Signed)
[place] [facility] [facility]) Care Management  08/04/2018  William Mueller 1951-01-27 [id number]    Unsuccessful call #1 placed to patients wife, [name] regarding patient assistance update for [employer], HIPAA compliant voicemail left.   Will make 2nd call attempt in 5-7 business days if call has not been returned.  [name] [place] Certified Pharmacy Technician [place] Management Direct Dial:[phone]

## 2018-08-06 DIAGNOSIS — J449 Chronic obstructive pulmonary disease, unspecified: Secondary | ICD-10-CM | POA: Diagnosis not present

## 2018-08-10 DIAGNOSIS — J449 Chronic obstructive pulmonary disease, unspecified: Secondary | ICD-10-CM | POA: Diagnosis not present

## 2018-08-10 DIAGNOSIS — Z9981 Dependence on supplemental oxygen: Secondary | ICD-10-CM | POA: Diagnosis not present

## 2018-08-10 DIAGNOSIS — R0609 Other forms of dyspnea: Secondary | ICD-10-CM | POA: Diagnosis not present

## 2018-08-14 DIAGNOSIS — Z79899 Other long term (current) drug therapy: Secondary | ICD-10-CM | POA: Diagnosis not present

## 2018-08-14 DIAGNOSIS — N183 Chronic kidney disease, stage 3 (moderate): Secondary | ICD-10-CM | POA: Diagnosis not present

## 2018-08-14 DIAGNOSIS — J449 Chronic obstructive pulmonary disease, unspecified: Secondary | ICD-10-CM | POA: Diagnosis not present

## 2018-08-14 DIAGNOSIS — I25119 Atherosclerotic heart disease of native coronary artery with unspecified angina pectoris: Secondary | ICD-10-CM | POA: Diagnosis not present

## 2018-08-14 DIAGNOSIS — F419 Anxiety disorder, unspecified: Secondary | ICD-10-CM | POA: Diagnosis not present

## 2018-08-14 DIAGNOSIS — F322 Major depressive disorder, single episode, severe without psychotic features: Secondary | ICD-10-CM | POA: Diagnosis not present

## 2018-08-14 DIAGNOSIS — M5416 Radiculopathy, lumbar region: Secondary | ICD-10-CM | POA: Diagnosis not present

## 2018-08-14 DIAGNOSIS — Z1331 Encounter for screening for depression: Secondary | ICD-10-CM | POA: Diagnosis not present

## 2018-08-14 DIAGNOSIS — I10 Essential (primary) hypertension: Secondary | ICD-10-CM | POA: Diagnosis not present

## 2018-08-16 ENCOUNTER — Other Ambulatory Visit: Payer: Self-pay | Admitting: Pharmacy Technician

## 2018-08-16 NOTE — Patient Outreach (Signed)
[place] [facility]) Care Management  08/16/2018  William Mueller 1951/02/04 [id number]    Follow up call placed to [employer] regarding patient assistance application(s) for [name] states that OOP spend had been received and patient is still considered over income for program.  Follow up:  Will update [facility] RPh Colleen Summe with information.  [name] [place] Certified Pharmacy Technician [place] Management Direct [phone]

## 2018-08-22 ENCOUNTER — Other Ambulatory Visit: Payer: Self-pay | Admitting: *Deleted

## 2018-08-22 NOTE — Patient Outreach (Signed)
[place] [facility]) Care Management  08/22/2018  William Mueller Aug 09, 1950 [id number]   [name] Question  Referral Date:07/22/2017 Referral Source:Nurse Line Reason for Referral:Lump to left side of neck that has been there for 1 month comes and goes. Causing problems with swallowing Insurance:Health Team Advantage   Outreach Attempt:  Received incoming call from patient's wife.  HIPAA verified with wife.  Wife asking is it safe for her to go to work ([employer]) with patient's medical history and the current outbreak of COVID-19.  Reviewed with patient and wife the state and federal guidelines set in place, one of which is for seniors to stay home as much as possible.  Discussed that Mr. Gaddie is a high risk patient and should be kept as healthy as possible.  Discussed with patient and wife social distancing, even in the home amongst the two of them as well as disinfecting versus cleaning.  Discussed using Telehealth if she or patient starts to exhibit symptoms.  Wife asked if Meals on Wheels were making free deliveries to seniors in this time of concern.  Question will be posed to [facility] Staff to see if they are aware of feeding resources.  Informed wife based on the guidelines discussed, RN Health Coach can not make decision concerning her going to work, but provided her with the information for her to make her own informed decision.  Plan: RN Health Coach will message [facility] Social Workers to verify if there are resources for feeding seniors in this time of need. RN Health Coach will outreach to patient or wife with answers once feedback is gotten for other [facility] staff.  [place] [phone] [email or web address]

## 2018-08-23 ENCOUNTER — Other Ambulatory Visit: Payer: Self-pay | Admitting: Pharmacist

## 2018-08-23 NOTE — Patient Outreach (Signed)
[place] [facility]) Care Management  [place] 08/23/2018  William Mueller Nov 29, 1950 [id number]  Reason for call: Spiriva patient assistance program denial  Updated patient and spouse regarding Spiriva program denial.  Also reviewed [employer] attestation letter which was mailed out to patient last week.  Patient and spouse aware to complete and return to [employer] after it arrives.    Plan: Will update [facility] pharmacy technician who will continue to follow along with patient regarding patient assistance program decision for Dulera, Proventil, and Zetia  Ralene Bathe, PharmD, [place] [phone]

## 2018-08-25 ENCOUNTER — Other Ambulatory Visit: Payer: Self-pay | Admitting: Pharmacy Technician

## 2018-08-25 NOTE — Patient Outreach (Signed)
[place] [facility]) Care Management  08/25/2018  HEITH HAIGLER [birth year] [id number]    Unsuccessful call #1 placed to patients wife regarding patient assistance receipt of attestation form from [employer] for Gerilyn Nestle and Proventil HFA, HIPAA compliant voicemail left.   Follow up:  Will make 2nd call attempt in 2-3 business days if call has not been returned.  [name] [place] Certified Pharmacy Technician [place] Management Direct Dial:[phone]

## 2018-08-29 ENCOUNTER — Other Ambulatory Visit: Payer: Self-pay

## 2018-08-29 ENCOUNTER — Other Ambulatory Visit: Payer: Self-pay | Admitting: *Deleted

## 2018-08-29 ENCOUNTER — Other Ambulatory Visit: Payer: Self-pay | Admitting: Pharmacy Technician

## 2018-08-29 NOTE — Patient Outreach (Signed)
William Mueller) Care Management  08/29/2018  William Mueller January 29, 1951 [id number]    Successful call placed to patient regarding patient assistance receipt of attestation form from [employer] for [facility] and Proventil HFA, Patient states that he received attestation form and mailed it back in today.   Follow up:  Will follow up with company in 5-7 business days to check application status.  [name] [place] Certified Pharmacy Technician [place] Management Direct Dial:[phone]

## 2018-08-29 NOTE — Patient Outreach (Signed)
[name]) Care Management  08/29/2018  William Mueller January 29, 1951 [id number]   Successful outreach to patient regarding social work referral for assistance with [name] and depression resources.  Patient reports being unable to prepare meals for himself.  His spouse works in Geologist, engineering at [employer] from 3:00-9:00 most days and patient is unable to prepare dinner.  BSW suggested meals being made ahead of time that can be warmed up while she is away. BSW agreed to submit referral to Meals on Wheels program but is unsure of eligibility and the length of the wait list. Patient did report depressive symptoms and interest in talking with a therapist.  BSW informed patient that, unfortunately, [facility] CSW's are not able to conduct home visits at this time due to threat of [place].  BSW offered for CSW to contact him to complete therapy session via phone but he declined this due to being hard of hearing.  BSW and patient talked about outpatient providers but patient does not want to pursue this right now due to the virus.   BSW left voicemail message for [name] with Meals on Wheels regarding referral.  BSW will follow up with patient when response is received.   [name], BSW Social Worker [phone]

## 2018-08-29 NOTE — Patient Outreach (Signed)
[name] [facility]) Care Management  08/29/2018  William Mueller 1950-09-12 [id number]   RN Health Coach General Question  Referral Date:07/22/2017 Referral Source:Nurse Line Reason for Referral:Lump to left side of neck that has been there for 1 month comes and goes. Causing problems with swallowing Insurance:Health [employer] Ad[employer]age   Outreach Attempt:  Successful telephone outreach to patient's wife for general question follow up.  HIPAA verified with wife.  Wife asked about food resources for patient in this time of social distancing and limiting outside exposure.  RN Health Coach outreach to [place] Work staff and is still awaiting response from [employer].  Did encourage wife to contact [phone] if she has any food resource concerns.  Wife states she would be interested on patient being on list for Meals on Wheels.  Also states patient is allergic to the depression medication prescribed by his primary care provider.  Encouraged patient and wife to notify provider of allergy to the medication and that class of medication.  Wife states patient is declining psychiatrist referral at this time.  [facility] Social Work referral discussed for Meals on Wheels and depression resources.  Wife verbally agrees.  Denies increase shortness of breath for patient, any cold of flu like symptoms and states he has not had any increase in oxygen need, as of yet.  Again reviewed signs and symptoms of COVID 19 and encouraged wife to contact providers if patient starts to exhibit symptoms.  Discussed and encouraged social distancing.  Plan: RN Health Coach will place [facility] Social Work referral for Meals on Wheels and Depression resources.  RN Health Coach will make next telephone outreach to patient within the month of April. [place] [phone] [name]

## 2018-08-31 ENCOUNTER — Other Ambulatory Visit: Payer: Self-pay

## 2018-08-31 NOTE — Patient Outreach (Signed)
[place] [facility]) Care Management  08/31/2018  William Mueller [birth year] [id number]   BSW left second voicemail message for [name] with [facility] on Wheels program regarding referral for patient.  [name], BSW Social Worker [phone]

## 2018-09-04 ENCOUNTER — Other Ambulatory Visit: Payer: Self-pay

## 2018-09-04 NOTE — Patient Outreach (Signed)
[place] [facility]) Care Management  09/04/2018  William Mueller [birth year] [id number]   BSW has not received response from two voicemail messages left for [name] with [facility] on Wheels regarding referral for patient.  BSW submitted referral information via secure email to [facility] and Development worker, international aid, [place] [employer].  BSW will follow up with patient when confirmation of referral is received.   [name], BSW Social Worker [phone]

## 2018-09-05 ENCOUNTER — Other Ambulatory Visit: Payer: Self-pay

## 2018-09-05 NOTE — Patient Outreach (Signed)
[place] [facility]) Care Management  09/05/2018  William Mueller 1950/11/14 [id number]   BSW received response to Meals on Wheels referral from [name], [place].  Per Janett Billow "We are closed right now until further notice due to COVID-19. We are only allowed through our funding to provide delivered or curbside pick-up meals to our current clients at this time. I will look at his address to see if we have availability in his area. If we have availability I will let you know so that we can do his home assessment and we could start meals after we are reopened. If we do not have availability in his area we will put him on a waiting list. I will work on this and be back in touch with you in the next few days.  BSW spoke with patient's wife today to provide the above update.  BSW will follow up after hearing back from [place].  [name], BSW Social Worker [phone]

## 2018-09-06 ENCOUNTER — Encounter
Admission: EM | Disposition: A | Discharge status: Home / Self Care | Payer: Self-pay | Source: Home / Self Care | Attending: Emergency Medicine

## 2018-09-06 ENCOUNTER — Observation Stay
Admission: EM | Admit: 2018-09-06 | Discharge: 2018-09-07 | Disposition: A | Discharge status: Home / Self Care | Payer: PPO | Attending: Internal Medicine | Admitting: Internal Medicine

## 2018-09-06 ENCOUNTER — Encounter: Payer: Self-pay | Admitting: Emergency Medicine

## 2018-09-06 ENCOUNTER — Other Ambulatory Visit: Payer: Self-pay | Admitting: *Deleted

## 2018-09-06 ENCOUNTER — Emergency Department: Payer: PPO

## 2018-09-06 ENCOUNTER — Other Ambulatory Visit: Payer: Self-pay

## 2018-09-06 DIAGNOSIS — E871 Hypo-osmolality and hyponatremia: Secondary | ICD-10-CM | POA: Diagnosis not present

## 2018-09-06 DIAGNOSIS — J449 Chronic obstructive pulmonary disease, unspecified: Secondary | ICD-10-CM | POA: Diagnosis not present

## 2018-09-06 DIAGNOSIS — R9431 Abnormal electrocardiogram [ECG] [EKG]: Secondary | ICD-10-CM | POA: Diagnosis not present

## 2018-09-06 DIAGNOSIS — Z809 Family history of malignant neoplasm, unspecified: Secondary | ICD-10-CM | POA: Diagnosis not present

## 2018-09-06 DIAGNOSIS — E7849 Other hyperlipidemia: Secondary | ICD-10-CM | POA: Diagnosis not present

## 2018-09-06 DIAGNOSIS — H6123 Impacted cerumen, bilateral: Secondary | ICD-10-CM | POA: Diagnosis not present

## 2018-09-06 DIAGNOSIS — Z1159 Encounter for screening for other viral diseases: Secondary | ICD-10-CM | POA: Diagnosis not present

## 2018-09-06 DIAGNOSIS — J9811 Atelectasis: Secondary | ICD-10-CM | POA: Diagnosis not present

## 2018-09-06 DIAGNOSIS — Z20828 Contact with and (suspected) exposure to other viral communicable diseases: Secondary | ICD-10-CM | POA: Diagnosis not present

## 2018-09-06 DIAGNOSIS — E782 Mixed hyperlipidemia: Secondary | ICD-10-CM | POA: Diagnosis not present

## 2018-09-06 DIAGNOSIS — I471 Supraventricular tachycardia: Secondary | ICD-10-CM

## 2018-09-06 DIAGNOSIS — I160 Hypertensive urgency: Secondary | ICD-10-CM | POA: Diagnosis not present

## 2018-09-06 DIAGNOSIS — I340 Nonrheumatic mitral (valve) insufficiency: Secondary | ICD-10-CM | POA: Diagnosis not present

## 2018-09-06 DIAGNOSIS — Z7902 Long term (current) use of antithrombotics/antiplatelets: Secondary | ICD-10-CM | POA: Diagnosis not present

## 2018-09-06 DIAGNOSIS — R739 Hyperglycemia, unspecified: Secondary | ICD-10-CM | POA: Diagnosis not present

## 2018-09-06 DIAGNOSIS — N179 Acute kidney failure, unspecified: Secondary | ICD-10-CM | POA: Diagnosis present

## 2018-09-06 DIAGNOSIS — M25352 Other instability, left hip: Secondary | ICD-10-CM | POA: Diagnosis not present

## 2018-09-06 DIAGNOSIS — E876 Hypokalemia: Secondary | ICD-10-CM | POA: Diagnosis not present

## 2018-09-06 DIAGNOSIS — Z79891 Long term (current) use of opiate analgesic: Secondary | ICD-10-CM | POA: Diagnosis not present

## 2018-09-06 DIAGNOSIS — R5383 Other fatigue: Secondary | ICD-10-CM | POA: Diagnosis not present

## 2018-09-06 DIAGNOSIS — R5381 Other malaise: Secondary | ICD-10-CM | POA: Diagnosis not present

## 2018-09-06 DIAGNOSIS — I200 Unstable angina: Secondary | ICD-10-CM | POA: Diagnosis not present

## 2018-09-06 DIAGNOSIS — M419 Scoliosis, unspecified: Secondary | ICD-10-CM | POA: Diagnosis not present

## 2018-09-06 DIAGNOSIS — I252 Old myocardial infarction: Secondary | ICD-10-CM | POA: Diagnosis not present

## 2018-09-06 DIAGNOSIS — N183 Chronic kidney disease, stage 3 unspecified: Secondary | ICD-10-CM | POA: Diagnosis present

## 2018-09-06 DIAGNOSIS — M24452 Recurrent dislocation, left hip: Secondary | ICD-10-CM | POA: Diagnosis not present

## 2018-09-06 DIAGNOSIS — R112 Nausea with vomiting, unspecified: Secondary | ICD-10-CM | POA: Diagnosis not present

## 2018-09-06 DIAGNOSIS — E7800 Pure hypercholesterolemia, unspecified: Secondary | ICD-10-CM | POA: Insufficient documentation

## 2018-09-06 DIAGNOSIS — F17290 Nicotine dependence, other tobacco product, uncomplicated: Secondary | ICD-10-CM | POA: Insufficient documentation

## 2018-09-06 DIAGNOSIS — J45909 Unspecified asthma, uncomplicated: Secondary | ICD-10-CM | POA: Diagnosis not present

## 2018-09-06 DIAGNOSIS — D692 Other nonthrombocytopenic purpura: Secondary | ICD-10-CM | POA: Diagnosis not present

## 2018-09-06 DIAGNOSIS — I25118 Atherosclerotic heart disease of native coronary artery with other forms of angina pectoris: Secondary | ICD-10-CM | POA: Diagnosis present

## 2018-09-06 DIAGNOSIS — M7732 Calcaneal spur, left foot: Secondary | ICD-10-CM | POA: Diagnosis not present

## 2018-09-06 DIAGNOSIS — Z7982 Long term (current) use of aspirin: Secondary | ICD-10-CM | POA: Diagnosis not present

## 2018-09-06 DIAGNOSIS — R109 Unspecified abdominal pain: Secondary | ICD-10-CM | POA: Diagnosis not present

## 2018-09-06 DIAGNOSIS — Z79899 Other long term (current) drug therapy: Secondary | ICD-10-CM | POA: Insufficient documentation

## 2018-09-06 DIAGNOSIS — I25110 Atherosclerotic heart disease of native coronary artery with unstable angina pectoris: Principal | ICD-10-CM

## 2018-09-06 DIAGNOSIS — I130 Hypertensive heart and chronic kidney disease with heart failure and stage 1 through stage 4 chronic kidney disease, or unspecified chronic kidney disease: Secondary | ICD-10-CM | POA: Diagnosis not present

## 2018-09-06 DIAGNOSIS — E785 Hyperlipidemia, unspecified: Secondary | ICD-10-CM | POA: Insufficient documentation

## 2018-09-06 DIAGNOSIS — F4321 Adjustment disorder with depressed mood: Secondary | ICD-10-CM | POA: Diagnosis not present

## 2018-09-06 DIAGNOSIS — H40231 Intermittent angle-closure glaucoma, right eye: Secondary | ICD-10-CM | POA: Diagnosis not present

## 2018-09-06 DIAGNOSIS — J432 Centrilobular emphysema: Secondary | ICD-10-CM

## 2018-09-06 DIAGNOSIS — I5043 Acute on chronic combined systolic (congestive) and diastolic (congestive) heart failure: Secondary | ICD-10-CM | POA: Diagnosis not present

## 2018-09-06 DIAGNOSIS — Z8673 Personal history of transient ischemic attack (TIA), and cerebral infarction without residual deficits: Secondary | ICD-10-CM | POA: Diagnosis not present

## 2018-09-06 DIAGNOSIS — F0390 Unspecified dementia without behavioral disturbance: Secondary | ICD-10-CM | POA: Diagnosis not present

## 2018-09-06 DIAGNOSIS — G4730 Sleep apnea, unspecified: Secondary | ICD-10-CM | POA: Diagnosis not present

## 2018-09-06 DIAGNOSIS — I480 Paroxysmal atrial fibrillation: Secondary | ICD-10-CM | POA: Diagnosis not present

## 2018-09-06 DIAGNOSIS — I482 Chronic atrial fibrillation, unspecified: Secondary | ICD-10-CM | POA: Diagnosis not present

## 2018-09-06 DIAGNOSIS — Z9981 Dependence on supplemental oxygen: Secondary | ICD-10-CM | POA: Insufficient documentation

## 2018-09-06 DIAGNOSIS — G40909 Epilepsy, unspecified, not intractable, without status epilepticus: Secondary | ICD-10-CM | POA: Diagnosis not present

## 2018-09-06 DIAGNOSIS — E119 Type 2 diabetes mellitus without complications: Secondary | ICD-10-CM | POA: Diagnosis not present

## 2018-09-06 DIAGNOSIS — K219 Gastro-esophageal reflux disease without esophagitis: Secondary | ICD-10-CM | POA: Insufficient documentation

## 2018-09-06 DIAGNOSIS — M76822 Posterior tibial tendinitis, left leg: Secondary | ICD-10-CM | POA: Diagnosis not present

## 2018-09-06 DIAGNOSIS — I472 Ventricular tachycardia: Secondary | ICD-10-CM | POA: Diagnosis not present

## 2018-09-06 DIAGNOSIS — M79672 Pain in left foot: Secondary | ICD-10-CM | POA: Diagnosis not present

## 2018-09-06 DIAGNOSIS — R002 Palpitations: Secondary | ICD-10-CM | POA: Diagnosis not present

## 2018-09-06 DIAGNOSIS — R4182 Altered mental status, unspecified: Secondary | ICD-10-CM | POA: Diagnosis not present

## 2018-09-06 DIAGNOSIS — I2510 Atherosclerotic heart disease of native coronary artery without angina pectoris: Secondary | ICD-10-CM | POA: Diagnosis not present

## 2018-09-06 DIAGNOSIS — J4530 Mild persistent asthma, uncomplicated: Secondary | ICD-10-CM | POA: Diagnosis not present

## 2018-09-06 DIAGNOSIS — R0902 Hypoxemia: Secondary | ICD-10-CM | POA: Diagnosis not present

## 2018-09-06 DIAGNOSIS — R570 Cardiogenic shock: Secondary | ICD-10-CM | POA: Diagnosis not present

## 2018-09-06 DIAGNOSIS — R000 Tachycardia, unspecified: Secondary | ICD-10-CM | POA: Diagnosis not present

## 2018-09-06 DIAGNOSIS — F329 Major depressive disorder, single episode, unspecified: Secondary | ICD-10-CM | POA: Diagnosis not present

## 2018-09-06 DIAGNOSIS — M069 Rheumatoid arthritis, unspecified: Secondary | ICD-10-CM | POA: Diagnosis not present

## 2018-09-06 DIAGNOSIS — D539 Nutritional anemia, unspecified: Secondary | ICD-10-CM | POA: Diagnosis not present

## 2018-09-06 DIAGNOSIS — F419 Anxiety disorder, unspecified: Secondary | ICD-10-CM | POA: Diagnosis not present

## 2018-09-06 DIAGNOSIS — I25810 Atherosclerosis of coronary artery bypass graft(s) without angina pectoris: Secondary | ICD-10-CM | POA: Diagnosis not present

## 2018-09-06 DIAGNOSIS — I788 Other diseases of capillaries: Secondary | ICD-10-CM | POA: Diagnosis not present

## 2018-09-06 DIAGNOSIS — D62 Acute posthemorrhagic anemia: Secondary | ICD-10-CM | POA: Diagnosis not present

## 2018-09-06 DIAGNOSIS — M810 Age-related osteoporosis without current pathological fracture: Secondary | ICD-10-CM | POA: Diagnosis not present

## 2018-09-06 DIAGNOSIS — Z23 Encounter for immunization: Secondary | ICD-10-CM | POA: Diagnosis not present

## 2018-09-06 DIAGNOSIS — R079 Chest pain, unspecified: Secondary | ICD-10-CM | POA: Diagnosis present

## 2018-09-06 DIAGNOSIS — F320 Major depressive disorder, single episode, mild: Secondary | ICD-10-CM | POA: Diagnosis not present

## 2018-09-06 DIAGNOSIS — I10 Essential (primary) hypertension: Secondary | ICD-10-CM | POA: Diagnosis not present

## 2018-09-06 DIAGNOSIS — D225 Melanocytic nevi of trunk: Secondary | ICD-10-CM | POA: Diagnosis not present

## 2018-09-06 DIAGNOSIS — Z955 Presence of coronary angioplasty implant and graft: Secondary | ICD-10-CM | POA: Insufficient documentation

## 2018-09-06 DIAGNOSIS — G459 Transient cerebral ischemic attack, unspecified: Secondary | ICD-10-CM | POA: Diagnosis not present

## 2018-09-06 DIAGNOSIS — H16143 Punctate keratitis, bilateral: Secondary | ICD-10-CM | POA: Diagnosis not present

## 2018-09-06 DIAGNOSIS — H903 Sensorineural hearing loss, bilateral: Secondary | ICD-10-CM | POA: Diagnosis not present

## 2018-09-06 DIAGNOSIS — T84021A Dislocation of internal left hip prosthesis, initial encounter: Secondary | ICD-10-CM | POA: Diagnosis not present

## 2018-09-06 DIAGNOSIS — I110 Hypertensive heart disease with heart failure: Secondary | ICD-10-CM | POA: Diagnosis not present

## 2018-09-06 DIAGNOSIS — E113293 Type 2 diabetes mellitus with mild nonproliferative diabetic retinopathy without macular edema, bilateral: Secondary | ICD-10-CM | POA: Diagnosis not present

## 2018-09-06 DIAGNOSIS — L821 Other seborrheic keratosis: Secondary | ICD-10-CM | POA: Diagnosis not present

## 2018-09-06 DIAGNOSIS — Z7401 Bed confinement status: Secondary | ICD-10-CM | POA: Diagnosis not present

## 2018-09-06 DIAGNOSIS — E039 Hypothyroidism, unspecified: Secondary | ICD-10-CM | POA: Diagnosis not present

## 2018-09-06 DIAGNOSIS — R0989 Other specified symptoms and signs involving the circulatory and respiratory systems: Secondary | ICD-10-CM | POA: Diagnosis not present

## 2018-09-06 DIAGNOSIS — Z8249 Family history of ischemic heart disease and other diseases of the circulatory system: Secondary | ICD-10-CM | POA: Diagnosis not present

## 2018-09-06 DIAGNOSIS — D1801 Hemangioma of skin and subcutaneous tissue: Secondary | ICD-10-CM | POA: Diagnosis not present

## 2018-09-06 DIAGNOSIS — J9601 Acute respiratory failure with hypoxia: Secondary | ICD-10-CM | POA: Diagnosis not present

## 2018-09-06 DIAGNOSIS — I420 Dilated cardiomyopathy: Secondary | ICD-10-CM | POA: Diagnosis not present

## 2018-09-06 HISTORY — PX: LEFT HEART CATH AND CORONARY ANGIOGRAPHY: CATH118249

## 2018-09-06 LAB — PROTIME-INR
INR: 1.1 (ref 0.8–1.2)
Prothrombin Time: 13.8 seconds (ref 11.4–15.2)

## 2018-09-06 LAB — BASIC METABOLIC PANEL
Anion gap: 10 (ref 5–15)
BUN: 14 mg/dL (ref 8–23)
CO2: 29 mmol/L (ref 22–32)
Calcium: 8.5 mg/dL — ABNORMAL LOW (ref 8.9–10.3)
Chloride: 99 mmol/L (ref 98–111)
Creatinine, Ser: 1.61 mg/dL — ABNORMAL HIGH (ref 0.61–1.24)
GFR calc Af Amer: 50 mL/min — ABNORMAL LOW (ref >60)
GFR calc non Af Amer: 43 mL/min — ABNORMAL LOW (ref >60)
Glucose, Bld: 103 mg/dL — ABNORMAL HIGH (ref 70–99)
Potassium: 3.2 mmol/L — ABNORMAL LOW (ref 3.5–5.1)
Sodium: 138 mmol/L (ref 135–145)

## 2018-09-06 LAB — APTT: aPTT: 28 seconds (ref 24–36)

## 2018-09-06 LAB — CBC
HCT: 32 % — ABNORMAL LOW (ref 39.0–52.0)
Hemoglobin: 10.5 g/dL — ABNORMAL LOW (ref 13.0–17.0)
MCH: 28.2 pg (ref 26.0–34.0)
MCHC: 32.8 g/dL (ref 30.0–36.0)
MCV: 85.8 fL (ref 80.0–100.0)
Platelets: 207 10*3/uL (ref 150–400)
RBC: 3.73 MIL/uL — ABNORMAL LOW (ref 4.22–5.81)
RDW: 12.7 % (ref 11.5–15.5)
WBC: 9.9 10*3/uL (ref 4.0–10.5)
nRBC: 0 % (ref 0.0–0.2)

## 2018-09-06 LAB — TROPONIN I
Troponin I: <0.03 ng/mL (ref <0.03)
Troponin I: <0.03 ng/mL (ref <0.03)
Troponin I: 0.04 ng/mL (ref <0.03)

## 2018-09-06 LAB — TSH: TSH: 1.212 u[IU]/mL (ref 0.350–4.500)

## 2018-09-06 LAB — HEMOGLOBIN A1C
Hgb A1c MFr Bld: 5.7 % — ABNORMAL HIGH (ref 4.8–5.6)
Mean Plasma Glucose: 116.89 mg/dL

## 2018-09-06 LAB — MAGNESIUM: Magnesium: 1.8 mg/dL (ref 1.7–2.4)

## 2018-09-06 LAB — HEPARIN LEVEL (UNFRACTIONATED): Heparin Unfractionated: <0.1 IU/mL — ABNORMAL LOW (ref 0.30–0.70)

## 2018-09-06 SURGERY — LEFT HEART CATH AND CORONARY ANGIOGRAPHY
Anesthesia: Moderate Sedation

## 2018-09-06 MED ORDER — SODIUM CHLORIDE 0.9% FLUSH
3.0000 mL | Freq: Two times a day (BID) | INTRAVENOUS | Status: DC
  Administered 2018-09-07 (×2): 3 mL via INTRAVENOUS

## 2018-09-06 MED ORDER — NITROGLYCERIN 0.4 MG SL SUBL: 0.4000 mg | SUBLINGUAL_TABLET | SUBLINGUAL | Status: DC | PRN

## 2018-09-06 MED ORDER — EZETIMIBE 10 MG PO TABS
10.0000 mg | ORAL_TABLET | Freq: Every day | ORAL | Status: DC
  Administered 2018-09-07: 10 mg via ORAL
  Filled 2018-09-06 (×2): qty 1

## 2018-09-06 MED ORDER — SODIUM CHLORIDE 0.9 % WEIGHT BASED INFUSION: 1.0000 mL/kg/h | INTRAVENOUS | Status: AC

## 2018-09-06 MED ORDER — HYDRALAZINE HCL 20 MG/ML IJ SOLN: 10.0000 mg | INTRAMUSCULAR | Status: AC | PRN

## 2018-09-06 MED ORDER — POTASSIUM CHLORIDE CRYS ER 20 MEQ PO TBCR
40.0000 meq | EXTENDED_RELEASE_TABLET | Freq: Once | ORAL | Status: AC
  Administered 2018-09-06: 40 meq via ORAL
  Filled 2018-09-06: qty 2

## 2018-09-06 MED ORDER — ALBUTEROL SULFATE HFA 108 (90 BASE) MCG/ACT IN AERS: 2.0000 | INHALATION_SPRAY | Freq: Four times a day (QID) | RESPIRATORY_TRACT | Status: DC | PRN

## 2018-09-06 MED ORDER — ONDANSETRON HCL 4 MG/2ML IJ SOLN: 4.0000 mg | Freq: Four times a day (QID) | INTRAMUSCULAR | Status: DC | PRN

## 2018-09-06 MED ORDER — IOPAMIDOL (ISOVUE-300) INJECTION 61%
INTRAVENOUS | Status: DC | PRN
  Administered 2018-09-06: 70 mL via INTRAVENOUS

## 2018-09-06 MED ORDER — ROFLUMILAST 500 MCG PO TABS
500.0000 ug | ORAL_TABLET | Freq: Every day | ORAL | Status: DC
  Filled 2018-09-06 (×2): qty 1

## 2018-09-06 MED ORDER — MIDAZOLAM HCL 2 MG/2ML IJ SOLN
INTRAMUSCULAR | Status: DC | PRN
  Administered 2018-09-06: 1 mg via INTRAVENOUS

## 2018-09-06 MED ORDER — SODIUM CHLORIDE 0.9 % IV SOLN: 250.0000 mL | INTRAVENOUS | Status: DC | PRN

## 2018-09-06 MED ORDER — ACETAMINOPHEN 325 MG PO TABS: 650.0000 mg | ORAL_TABLET | Freq: Four times a day (QID) | ORAL | Status: DC | PRN

## 2018-09-06 MED ORDER — FENTANYL CITRATE (PF) 100 MCG/2ML IJ SOLN
INTRAMUSCULAR | Status: DC | PRN
  Administered 2018-09-06: 25 ug via INTRAVENOUS

## 2018-09-06 MED ORDER — ONDANSETRON HCL 4 MG PO TABS: 4.0000 mg | ORAL_TABLET | Freq: Four times a day (QID) | ORAL | Status: DC | PRN

## 2018-09-06 MED ORDER — ALPRAZOLAM ER 1 MG PO TB24
3.0000 mg | ORAL_TABLET | Freq: Every day | ORAL | Status: DC
  Administered 2018-09-06: 22:00:00 3 mg via ORAL
  Filled 2018-09-06: qty 3

## 2018-09-06 MED ORDER — FENTANYL CITRATE (PF) 100 MCG/2ML IJ SOLN
INTRAMUSCULAR | Status: AC
  Filled 2018-09-06: qty 2

## 2018-09-06 MED ORDER — ALBUTEROL SULFATE (2.5 MG/3ML) 0.083% IN NEBU: 2.5000 mg | INHALATION_SOLUTION | Freq: Four times a day (QID) | RESPIRATORY_TRACT | Status: DC | PRN

## 2018-09-06 MED ORDER — ALPRAZOLAM 1 MG PO TABS
1.0000 mg | ORAL_TABLET | Freq: Every day | ORAL | Status: DC
  Administered 2018-09-07: 1 mg via ORAL
  Filled 2018-09-06: qty 1

## 2018-09-06 MED ORDER — HYDRALAZINE HCL 25 MG PO TABS: 25.0000 mg | ORAL_TABLET | ORAL | Status: DC | PRN

## 2018-09-06 MED ORDER — ASPIRIN EC 81 MG PO TBEC
81.0000 mg | DELAYED_RELEASE_TABLET | Freq: Every day | ORAL | Status: DC
  Administered 2018-09-07: 10:00:00 81 mg via ORAL
  Filled 2018-09-06: qty 1

## 2018-09-06 MED ORDER — ATORVASTATIN CALCIUM 20 MG PO TABS
80.0000 mg | ORAL_TABLET | Freq: Every day | ORAL | Status: DC
  Administered 2018-09-07: 80 mg via ORAL
  Filled 2018-09-06: qty 4

## 2018-09-06 MED ORDER — SODIUM CHLORIDE 0.9% FLUSH
3.0000 mL | Freq: Two times a day (BID) | INTRAVENOUS | Status: DC
  Administered 2018-09-06: 3 mL via INTRAVENOUS

## 2018-09-06 MED ORDER — ACETAMINOPHEN 650 MG RE SUPP: 650.0000 mg | Freq: Four times a day (QID) | RECTAL | Status: DC | PRN

## 2018-09-06 MED ORDER — TIOTROPIUM BROMIDE MONOHYDRATE 18 MCG IN CAPS
18.0000 ug | ORAL_CAPSULE | Freq: Every day | RESPIRATORY_TRACT | Status: DC
  Administered 2018-09-07: 18 ug via RESPIRATORY_TRACT
  Filled 2018-09-06: qty 5

## 2018-09-06 MED ORDER — HEPARIN (PORCINE) IN NACL 1000-0.9 UT/500ML-% IV SOLN
INTRAVENOUS | Status: AC
  Filled 2018-09-06: qty 1000

## 2018-09-06 MED ORDER — IOHEXOL 300 MG/ML  SOLN
INTRAMUSCULAR | Status: DC | PRN
  Administered 2018-09-06: 30 mL via INTRA_ARTERIAL

## 2018-09-06 MED ORDER — GABAPENTIN 300 MG PO CAPS
300.0000 mg | ORAL_CAPSULE | Freq: Two times a day (BID) | ORAL | Status: DC
  Administered 2018-09-06 – 2018-09-07 (×2): 300 mg via ORAL
  Filled 2018-09-06 (×2): qty 1

## 2018-09-06 MED ORDER — MIDAZOLAM HCL 2 MG/2ML IJ SOLN
INTRAMUSCULAR | Status: AC
  Filled 2018-09-06: qty 2

## 2018-09-06 MED ORDER — NITROGLYCERIN 2 % TD OINT
1.0000 [in_us] | TOPICAL_OINTMENT | Freq: Once | TRANSDERMAL | Status: AC
  Administered 2018-09-06: 1 [in_us] via TOPICAL
  Filled 2018-09-06: qty 1

## 2018-09-06 MED ORDER — ACETAMINOPHEN 325 MG PO TABS: 650.0000 mg | ORAL_TABLET | ORAL | Status: DC | PRN

## 2018-09-06 MED ORDER — ALPRAZOLAM ER 1 MG PO TB24: 3.0000 mg | ORAL_TABLET | Freq: Every day | ORAL | Status: DC

## 2018-09-06 MED ORDER — NITROGLYCERIN 2 % TD OINT
0.5000 [in_us] | TOPICAL_OINTMENT | Freq: Four times a day (QID) | TRANSDERMAL | Status: DC
  Administered 2018-09-06 – 2018-09-07 (×2): 0.5 [in_us] via TOPICAL
  Filled 2018-09-06 (×2): qty 1

## 2018-09-06 MED ORDER — SODIUM CHLORIDE 0.9 % WEIGHT BASED INFUSION
3.0000 mL/kg/h | INTRAVENOUS | Status: DC
  Administered 2018-09-06: 14:00:00 3 mL/kg/h via INTRAVENOUS

## 2018-09-06 MED ORDER — MECLIZINE HCL 25 MG PO TABS
25.0000 mg | ORAL_TABLET | Freq: Three times a day (TID) | ORAL | Status: DC | PRN
  Filled 2018-09-06: qty 1

## 2018-09-06 MED ORDER — SODIUM CHLORIDE 0.9% FLUSH: 3.0000 mL | INTRAVENOUS | Status: DC | PRN

## 2018-09-06 MED ORDER — HEPARIN (PORCINE) IN NACL 1000-0.9 UT/500ML-% IV SOLN
INTRAVENOUS | Status: DC | PRN
  Administered 2018-09-06 (×2): 500 mL

## 2018-09-06 MED ORDER — ASPIRIN EC 81 MG PO TBEC: 81.0000 mg | DELAYED_RELEASE_TABLET | Freq: Every day | ORAL | Status: DC

## 2018-09-06 MED ORDER — MORPHINE SULFATE (PF) 4 MG/ML IV SOLN
4.0000 mg | Freq: Once | INTRAVENOUS | Status: AC
  Administered 2018-09-06: 4 mg via INTRAVENOUS
  Filled 2018-09-06: qty 1

## 2018-09-06 MED ORDER — LABETALOL HCL 5 MG/ML IV SOLN: 10.0000 mg | INTRAVENOUS | Status: AC | PRN

## 2018-09-06 MED ORDER — METOPROLOL SUCCINATE ER 25 MG PO TB24
25.0000 mg | ORAL_TABLET | Freq: Every day | ORAL | Status: DC
  Administered 2018-09-07: 25 mg via ORAL
  Filled 2018-09-06: qty 1

## 2018-09-06 MED ORDER — MOMETASONE FURO-FORMOTEROL FUM 100-5 MCG/ACT IN AERO
2.0000 | INHALATION_SPRAY | Freq: Two times a day (BID) | RESPIRATORY_TRACT | Status: DC
  Administered 2018-09-06 – 2018-09-07 (×2): 2 via RESPIRATORY_TRACT
  Filled 2018-09-06: qty 8.8

## 2018-09-06 MED ORDER — SODIUM CHLORIDE 0.9 % WEIGHT BASED INFUSION: 1.0000 mL/kg/h | INTRAVENOUS | Status: DC

## 2018-09-06 MED ORDER — HEPARIN (PORCINE) 25000 UT/250ML-% IV SOLN
1100.0000 [IU]/h | INTRAVENOUS | Status: DC
  Administered 2018-09-06: 1100 [IU]/h via INTRAVENOUS
  Filled 2018-09-06: qty 250

## 2018-09-06 MED ORDER — HEPARIN BOLUS VIA INFUSION
4000.0000 [IU] | Freq: Once | INTRAVENOUS | Status: AC
  Administered 2018-09-06: 4000 [IU] via INTRAVENOUS
  Filled 2018-09-06: qty 4000

## 2018-09-06 SURGICAL SUPPLY — 11 items
CATH INFINITI 5FR ANG PIGTAIL (CATHETERS) ×2 IMPLANT
CATH INFINITI 5FR JL4 (CATHETERS) ×2 IMPLANT
CATH INFINITI JR4 5F (CATHETERS) ×2 IMPLANT
DEVICE CLOSURE MYNXGRIP 5F (Vascular Products) ×2 IMPLANT
KIT MANI 3VAL PERCEP (MISCELLANEOUS) ×3 IMPLANT
NDL PERC 18GX7CM (NEEDLE) IMPLANT
NEEDLE PERC 18GX7CM (NEEDLE) ×3 IMPLANT
NEEDLE SMART 18G ACCESS (NEEDLE) ×2 IMPLANT
PACK CARDIAC CATH (CUSTOM PROCEDURE TRAY) ×3 IMPLANT
SHEATH AVANTI 5FR X 11CM (SHEATH) ×4 IMPLANT
WIRE GUIDERIGHT .035X150 (WIRE) ×2 IMPLANT

## 2018-09-06 NOTE — ED Notes (Signed)
Attempted to call report- will call back in 15 minutes

## 2018-09-06 NOTE — ED Notes (Signed)
Pt provided verbal permission to this RN  to speak with wife Ario Mcdiarmid.

## 2018-09-06 NOTE — Progress Notes (Signed)
ANTICOAGULATION CONSULT NOTE - Initial Consult  Pharmacy Consult for Heparin Drip Indication: chest pain/ACS  Allergies  Allergen Reactions  . Paroxetine Hcl Shortness Of Breath and Palpitations  . Serotonin Reuptake Inhibitors (Ssris) Shortness Of Breath and Palpitations  . Diazepam Other (See Comments)    Rapid heartrate  . Doxycycline Monohydrate Other (See Comments)    Unknown allergic reaction  . Escitalopram Oxalate Other (See Comments)    serotonin Syndrome  . Lorazepam Other (See Comments)    Unknown allergic reaction  . Tetracyclines & Related Itching and Rash    Patient Measurements: Height: 5\' 9"  (175.3 cm) Weight: 202 lb (91.6 kg) IBW/kg (Calculated) : 70.7 Heparin Dosing Weight: 89.4 kg  Vital Signs: Temp: 98 F (36.7 C) (04/01 0955) Temp Source: Oral (04/01 0955) BP: 111/84 (04/01 0955) Pulse Rate: 113 (04/01 0955)  Labs: Recent Labs    09/06/18 1004  HGB 10.5*  HCT 32.0*  PLT 207  CREATININE 1.61*  TROPONINI <0.03    Estimated Creatinine Clearance: 48.4 mL/min (A) (by C-G formula based on SCr of 1.61 mg/dL (H)).   Medical History: Past Medical History:  Diagnosis Date  . Anginal pain ([place]) 06/2017  . Anxiety   . Bronchitis 06/2017  . Chronic chest pain   . Chronic kidney disease (CKD) stage G3b/A1, moderately decreased glomerular filtration rate (GFR) between 30-44 mL/min/1.73 square meter and albuminuria creatinine ratio less than 30 mg/g (HCC)   . Chronic lower back pain    WITH LEG WEAKNESS  . COPD (chronic obstructive pulmonary disease) (HCC)    EMPHYSEMA, O2 AT 2L PRN  . Coronary artery disease    a. 12/2013 PCI: mRCA 100% with L to R collats s/p PCI/DES;  b. 06/2014 MV: no ischemia/infarct, EF 53%;  c. 03/2015 Cath: LM nl, LAD nl, D1 80 (1.64mm), LCX 17m (<1.19mm), OM1 nl, RCA 55p, patent stent, RPDA nl, EF 65%; d. 09/2015 Cath: LM nl, LAD 15m, D1 80 (<23mm), LCX 54m/d (<1.74mm), OM1 nl, RCA 55p (FFR 0.93), patent stent, RPDA nl-->Med Rx.  .  Daily headache   . Depression   . Diastolic dysfunction    a. echo 12/2013: EF 55-60%, mild LVH, GR1DD, inf HK, elevated CVP, mildly dilated IVC suggestive of increased RA pressure  . Dyspnea    WHEEZING  . GERD (gastroesophageal reflux disease)    REFLUX  . Hearing loss   . High cholesterol   . Hypertension   . Myocardial infarction ([place]) 2015  . Nicotine addiction    a. using eCigs.  . Orthopnea   . Sleep apnea     Medications:  Scheduled:  . ALPRAZolam  3 mg Oral QHS  . ALPRAZolam  1 mg Oral Daily  . [START ON 09/07/2018] aspirin EC  81 mg Oral Daily  . atorvastatin  80 mg Oral Daily  . ezetimibe  10 mg Oral Daily  . gabapentin  300 mg Oral BID  . metoprolol succinate  25 mg Oral Daily  . mometasone-formoterol  2 puff Inhalation BID  . nitroGLYCERIN  0.5 inch Topical Q6H  . potassium chloride  40 mEq Oral Once  . roflumilast  500 mcg Oral Daily  . tiotropium  18 mcg Inhalation Daily    Assessment: William Mueller is a 68 y.o. male with a known history of CAD presents with chest pain. Pharmacy has been consulted to initiate Heparin Drip on patient. Patient was not taking any anticoagulation medication prior to admittance. We will initiate therapy immediately. Will order baseline  PTT and PT/INR.  Goal of Therapy:  Heparin level 0.3-0.7 units/ml Monitor platelets by anticoagulation protocol: Yes   Plan:  Give 4000 units bolus x 1 Start heparin infusion at 1100 units/hr Check anti-Xa/HL level in 6 hours and daily while on heparin Continue to monitor H&H and platelets  Montoya Watkin A Ahmira Boisselle 09/06/2018,11:49 AM

## 2018-09-06 NOTE — Care Management Obs Status (Signed)
[place] NOTIFICATION   Patient Details  Name: YESHUA STRYKER MRN: [id number] Date of Birth: [birth year]   Medicare Observation Status Notification Given:  Yes    Darreon Lutes A Malikhi Ogan, RN 09/06/2018, 11:49 AM

## 2018-09-06 NOTE — ED Notes (Signed)
Report given to Sandra, RN.

## 2018-09-06 NOTE — ED Notes (Signed)
Pt denies N/V/dizziness. Pt c/o SHOB at this time. Pt uses oxygen at home , pt refuses to wear oxygen at this time SpO2 at 96% RA. NAd noted at this time.

## 2018-09-06 NOTE — Patient Outreach (Signed)
[facility]) Care Management  09/06/2018  ALEPH NICKSON 06-Nov-1950 [id number]   RN Health CoachCase Closure/Transition to [facility]  Referral Date:07/22/2017 Referral Source:Nurse Line Reason for Referral:Lump to left side of neck that has been there for 1 month comes and goes. Causing problems with swallowing Insurance:[employer]   Outreach Attempt:  Notified patient transition to [facility] program.  Also received notification patient admitted to [facility] and is awaiting Heart Catheterization.  Patient to be followed by [facility] department and [facility] on discharge.  RN Health Coach has communicated with and updated [place] [facility] and [place] [facility] working with patient.  They will close case once the are completed.  Plan:  RN Health Coach will close Disease Management case due to hospitalization and patient participating in another Care Management Program.  Will leave patient active for [facility] to continue working with medication assistance with patient and they will make patient inactive once they are complete (discussed with [facility] Pharmacist Ralene Bathe).  RN Health Coach will send primary care provider Care Management Case Closure Letter.  RN Health Coach will send patient CM Case Closure Letter.  [place] [phone] [email or web address]

## 2018-09-06 NOTE — ED Provider Notes (Signed)
[facility] Emergency Department Provider Note       Time seen: ----------------------------------------- 10:03 AM on 09/06/2018 -----------------------------------------   I have reviewed the triage vital signs and the nursing notes.  HISTORY   Chief Complaint Chest Pain    HPI William Mueller is a 68 y.o. male with a history of anxiety, bronchitis, chronic chest pain, chronic kidney disease, COPD, depression who presents to the ED for chest pain.  Patient reports 7 out of 10 chest pain that woke him up from sleep at midnight.  EMS used sublingual nitroglycerin without any improvement, he had taken several of his own prior to arrival as well.  Patient states he had a heart attack in 2015.  Past Medical History:  Diagnosis Date  . Anginal pain ([place]) 06/2017  . Anxiety   . Bronchitis 06/2017  . Chronic chest pain   . Chronic kidney disease (CKD) stage G3b/A1, moderately decreased glomerular filtration rate (GFR) between 30-44 mL/min/1.73 square meter and albuminuria creatinine ratio less than 30 mg/g (HCC)   . Chronic lower back pain    WITH LEG WEAKNESS  . COPD (chronic obstructive pulmonary disease) (HCC)    EMPHYSEMA, O2 AT 2L PRN  . Coronary artery disease    a. 12/2013 PCI: mRCA 100% with L to R collats s/p PCI/DES;  b. 06/2014 MV: no ischemia/infarct, EF 53%;  c. 03/2015 Cath: LM nl, LAD nl, D1 80 (1.19mm), LCX 65m (<1.75mm), OM1 nl, RCA 55p, patent stent, RPDA nl, EF 65%; d. 09/2015 Cath: LM nl, LAD 49m, D1 80 (<47mm), LCX 25m/d (<1.5mm), OM1 nl, RCA 55p (FFR 0.93), patent stent, RPDA nl-->Med Rx.  . Daily headache   . Depression   . Diastolic dysfunction    a. echo 12/2013: EF 55-60%, mild LVH, GR1DD, inf HK, elevated CVP, mildly dilated IVC suggestive of increased RA pressure  . Dyspnea    WHEEZING  . GERD (gastroesophageal reflux disease)    REFLUX  . Hearing loss   . High cholesterol   . Hypertension   . Myocardial infarction ([place]) 2015  .  Nicotine addiction    a. using eCigs.  . Orthopnea   . Sleep apnea     Patient Active Problem List   Diagnosis Date Noted  . Chest pain 09/30/2017  . Hypokalemia 09/29/2017  . Near syncope 09/29/2017  . Orthostatic hypotension 09/29/2017  . Nicotine addiction   . CAD S/P PCI DES to RCA 03/11/2015  . Diastolic dysfunction   . Chronic chest pain   . Chest pain at rest 11/23/2014  . GERD (gastroesophageal reflux disease) 06/08/2014  . CKD (chronic kidney disease) stage 3, GFR 30-59 ml/min (HCC) 05/04/2014  . Bilateral leg pain 05/04/2014  . Lung nodule < 6cm on CT 05/04/2014  . Abnormal CT scan, kidney 05/04/2014  . Unstable angina ([place]) 05/04/2014  . Atypical chest pain 03/22/2014  . Atrial tachycardia, paroxysmal ([place]) 12/14/2013  . Atherosclerosis of native coronary artery with unstable angina pectoris ([place]) 12/13/2013  . NSTEMI (non-ST elevated myocardial infarction) ([place]) 12/13/2013  . Labile essential hypertension 12/11/2013  . Acute renal failure superimposed on stage 3 chronic kidney disease ([place]) 12/11/2013  . Hyperlipidemia 12/11/2013  . COPD (chronic obstructive pulmonary disease) ([place]) 12/11/2013    Past Surgical History:  Procedure Laterality Date  . CARDIAC CATHETERIZATION     MC x 1 stent  . CARDIAC CATHETERIZATION Left 03/12/2015   Procedure: Left Heart Cath and Coronary Angiography;  Surgeon: Leonie Man, MD;  Location: [place] CV LAB;  Service: Cardiovascular;  Laterality: Left;  . CARDIAC CATHETERIZATION N/A 09/29/2015   Procedure: Left Heart Cath and Coronary Angiography;  Surgeon: Wellington Hampshire, MD;  Location: [place] CV LAB;  Service: Cardiovascular;  Laterality: N/A;  . CATARACT EXTRACTION W/PHACO Right 08/10/2017   Procedure: CATARACT EXTRACTION PHACO AND INTRAOCULAR LENS PLACEMENT ([place]) RIGHT;  Surgeon: Leandrew Koyanagi, MD;  Location: [place];  Service: Ophthalmology;  Laterality: Right;  . CORONARY ANGIOPLASTY      STENT PLACEMENT  . LEFT HEART CATHETERIZATION WITH CORONARY ANGIOGRAM N/A 12/12/2013   Procedure: LEFT HEART CATHETERIZATION WITH CORONARY ANGIOGRAM;  Surgeon: Wellington Hampshire, MD;  Location: [place] CATH LAB;  Service: Cardiovascular;  Laterality: N/A;    Allergies Paroxetine hcl; Serotonin reuptake inhibitors (ssris); Diazepam; Doxycycline monohydrate; Escitalopram oxalate; Lorazepam; and Tetracyclines & related  Social History Social History   Tobacco Use  . Smoking status: Former Smoker    Years: 48.00    Types: E-cigarettes    Last attempt to quit: 06/07/2009    Years since quitting: 9.2  . Smokeless tobacco: Never Used  . Tobacco comment: Using electronic cigarette  Substance Use Topics  . Alcohol use: Yes    Alcohol/week: 6.0 standard drinks    Types: 6 Cans of beer per week    Comment: Occasional   . Drug use: No   Review of Systems Constitutional: Negative for fever. Cardiovascular: Positive for chest pain Respiratory: Negative for shortness of breath. Gastrointestinal: Negative for abdominal pain, vomiting and diarrhea. Musculoskeletal: Negative for back pain. Skin: Negative for rash. Neurological: Negative for headaches, focal weakness or numbness.  All systems negative/normal/unremarkable except as stated in the HPI  ____________________________________________   PHYSICAL EXAM:  VITAL SIGNS: ED Triage Vitals  Enc Vitals Group     BP 09/06/18 0955 111/84     Pulse Rate 09/06/18 0955 (!) 113     Resp 09/06/18 0955 16     Temp 09/06/18 0955 98 F (36.7 C)     Temp Source 09/06/18 0955 Oral     SpO2 09/06/18 0955 95 %     Weight 09/06/18 0956 202 lb (91.6 kg)     Height 09/06/18 0956 5\' 9"  (1.753 m)     Head Circumference --      Peak Flow --      Pain Score 09/06/18 0956 7     Pain Loc --      Pain Edu? --      Excl. in [place]? --     Constitutional: Alert and oriented. Well appearing and in no distress. Eyes: Conjunctivae are normal. Normal extraocular  movements. Cardiovascular: Normal rate, regular rhythm. No murmurs, rubs, or gallops. Respiratory: Normal respiratory effort without tachypnea nor retractions. Breath sounds are clear and equal bilaterally. No wheezes/rales/rhonchi. Gastrointestinal: Soft and nontender. Normal bowel sounds Musculoskeletal: Nontender with normal range of motion in extremities. No lower extremity tenderness nor edema. Neurologic:  Normal speech and language. No gross focal neurologic deficits are appreciated.  Skin:  Skin is warm, dry and intact. No rash noted. Psychiatric: Mood and affect are normal. Speech and behavior are normal.  ____________________________________________  EKG: Interpreted by me.  Sinus tachycardia at a rate 110 bpm, long QT, normal axis, normal QRS  ____________________________________________  ED COURSE:  As part of my medical decision making, I reviewed the following data within the [place] History obtained from family if available, nursing notes, old chart and ekg, as well as  notes from prior ED visits. Patient presented for chest pain, we will assess with labs and imaging as indicated at this time.   Procedures William Mueller was evaluated in Emergency Department on 09/06/2018 for the symptoms described in the history of present illness. He was evaluated in the context of the global COVID-19 pandemic, which necessitated consideration that the patient might be at risk for infection with the SARS-CoV-2 virus that causes COVID-19. Institutional protocols and algorithms that pertain to the evaluation of patients at risk for COVID-19 are in a state of rapid change based on information released by regulatory bodies including the CDC and federal and state organizations. These policies and algorithms were followed during the patient's care in the ED.  ____________________________________________   LABS (pertinent positives/negatives)  Labs Reviewed  BASIC METABOLIC PANEL  - Abnormal; Notable for the following components:      Result Value   Potassium 3.2 (*)    Glucose, Bld 103 (*)    Creatinine, Ser 1.61 (*)    Calcium 8.5 (*)    GFR calc non Af Amer 43 (*)    GFR calc Af Amer 50 (*)    All other components within normal limits  CBC - Abnormal; Notable for the following components:   RBC 3.73 (*)    Hemoglobin 10.5 (*)    HCT 32.0 (*)    All other components within normal limits  TROPONIN I    RADIOLOGY Images were viewed by me  Chest x-ray IMPRESSION: No active disease. ____________________________________________   DIFFERENTIAL DIAGNOSIS   Musculoskeletal pain, GERD, MI, unstable angina, anxiety  FINAL ASSESSMENT AND PLAN  Chest pain   Plan: The patient had presented for nonspecific chest pain. Patient's labs did not reveal any acute process. Patient's imaging reassuring.  Currently his chest pain is improved.  I will discuss with the hospitalist for admission.   Laurence Aly, MD    Note: This note was generated in part or whole with voice recognition software. Voice recognition is usually quite accurate but there are transcription errors that can and very often do occur. I apologize for any typographical errors that were not detected and corrected.     Earleen Newport, MD 09/06/18 1050

## 2018-09-06 NOTE — Progress Notes (Signed)
Patient ID: William Mueller, male   DOB: [birth year], 68 y.o.   MRN: [id number]  ACP note  Patient present  Diagnosis: Chest pain with history of CAD concerning for ACS, hypertension, hyperlipidemia, stage IV COPD, chronic kidney disease stage III, anxiety  CODE STATUS discussed and patient wishes to be a full code  Patient coming in with chest pain that he felt was similar to his previous heart attack.  Chest pain was 10 out of 10 in intensity with radiation to the neck associated with shortness of breath and weakness.  Patient's pain was relieved by morphine and nitroglycerin patch in the emergency room.  Hospitalist services were contacted for evaluation.  I contacted cardiology to decide if they want to do a cardiac catheterization.  Time spent on ACP discussion 17 minutes Dr Loletha Grayer

## 2018-09-06 NOTE — ED Notes (Signed)
Pt wear hearing aids. Pt does not have hearing aids with him at this time.

## 2018-09-06 NOTE — H&P (Signed)
[facility] [place] NAME: William Mueller    MR#:  [id number]  DATE OF BIRTH:  01/31/1951  DATE OF ADMISSION:  09/06/2018  PRIMARY CARE PHYSICIAN: Philmore Pali, NP   REQUESTING/REFERRING PHYSICIAN: Dr Cephas Darby  CHIEF COMPLAINT:   Chief Complaint  Patient presents with  . Chest Pain    HISTORY OF PRESENT ILLNESS:  William Mueller  is a 68 y.o. male with a known history of CAD presents with chest pain.  He states he woke up before daylight.  He had a dull pressure in his chest 10 out of 10 in intensity.  It was constant but sort of comes and goes a little bit also dipping down to a 5 out of 10 intensity.  It radiates up into his neck.  He also stated he had some shortness of breath and some weakness.  No nausea or sweating.  He took 3 nitroglycerin at home and then called EMS.  His blood pressure was high when EMS came.  He was given some more nitroglycerin.  His chest pain went away after he was given morphine and nitroglycerin patch.  Hospitalist services contacted for further evaluation.  First troponin is negative.  PAST MEDICAL HISTORY:   Past Medical History:  Diagnosis Date  . Anginal pain ([place]) 06/2017  . Anxiety   . Bronchitis 06/2017  . Chronic chest pain   . Chronic kidney disease (CKD) stage G3b/A1, moderately decreased glomerular filtration rate (GFR) between 30-44 mL/min/1.73 square meter and albuminuria creatinine ratio less than 30 mg/g (HCC)   . Chronic lower back pain    WITH LEG WEAKNESS  . COPD (chronic obstructive pulmonary disease) (HCC)    EMPHYSEMA, O2 AT 2L PRN  . Coronary artery disease    a. 12/2013 PCI: mRCA 100% with L to R collats s/p PCI/DES;  b. 06/2014 MV: no ischemia/infarct, EF 53%;  c. 03/2015 Cath: LM nl, LAD nl, D1 80 (1.67mm), LCX 74m (<1.53mm), OM1 nl, RCA 55p, patent stent, RPDA nl, EF 65%; d. 09/2015 Cath: LM nl, LAD 67m, D1 80 (<85mm), LCX 52m/d (<1.33mm), OM1 nl, RCA 55p (FFR 0.93), patent  stent, RPDA nl-->Med Rx.  . Daily headache   . Depression   . Diastolic dysfunction    a. echo 12/2013: EF 55-60%, mild LVH, GR1DD, inf HK, elevated CVP, mildly dilated IVC suggestive of increased RA pressure  . Dyspnea    WHEEZING  . GERD (gastroesophageal reflux disease)    REFLUX  . Hearing loss   . High cholesterol   . Hypertension   . Myocardial infarction ([place]) 2015  . Nicotine addiction    a. using eCigs.  . Orthopnea   . Sleep apnea     PAST SURGICAL HISTORY:   Past Surgical History:  Procedure Laterality Date  . CARDIAC CATHETERIZATION     MC x 1 stent  . CARDIAC CATHETERIZATION Left 03/12/2015   Procedure: Left Heart Cath and Coronary Angiography;  Surgeon: Leonie Man, MD;  Location: [place] CV LAB;  Service: Cardiovascular;  Laterality: Left;  . CARDIAC CATHETERIZATION N/A 09/29/2015   Procedure: Left Heart Cath and Coronary Angiography;  Surgeon: Wellington Hampshire, MD;  Location: [place] CV LAB;  Service: Cardiovascular;  Laterality: N/A;  . CATARACT EXTRACTION W/PHACO Right 08/10/2017   Procedure: CATARACT EXTRACTION PHACO AND INTRAOCULAR LENS PLACEMENT ([place]) RIGHT;  Surgeon: Leandrew Koyanagi, MD;  Location: [place];  Service: Ophthalmology;  Laterality: Right;  .  CORONARY ANGIOPLASTY     STENT PLACEMENT  . LEFT HEART CATHETERIZATION WITH CORONARY ANGIOGRAM N/A 12/12/2013   Procedure: LEFT HEART CATHETERIZATION WITH CORONARY ANGIOGRAM;  Surgeon: Wellington Hampshire, MD;  Location: [place] CATH LAB;  Service: Cardiovascular;  Laterality: N/A;    SOCIAL HISTORY:   Social History   Tobacco Use  . Smoking status: Former Smoker    Years: 48.00    Types: E-cigarettes    Last attempt to quit: 06/07/2009    Years since quitting: 9.2  . Smokeless tobacco: Never Used  . Tobacco comment: Using electronic cigarette  Substance Use Topics  . Alcohol use: Yes    Alcohol/week: 6.0 standard drinks    Types: 6 Cans of beer per week    Comment: Occasional      FAMILY HISTORY:   Family History  Problem Relation Age of Onset  . Coronary artery disease Brother   . Heart disease Brother 49  . CVA Mother   . Heart disease Father        MI x 8  . Coronary artery disease Father   . Kidney disease Neg Hx   . Prostate cancer Neg Hx     DRUG ALLERGIES:   Allergies  Allergen Reactions  . Paroxetine Hcl Shortness Of Breath and Palpitations  . Serotonin Reuptake Inhibitors (Ssris) Shortness Of Breath and Palpitations  . Diazepam Other (See Comments)    Rapid heartrate  . Doxycycline Monohydrate Other (See Comments)    Unknown allergic reaction  . Escitalopram Oxalate Other (See Comments)    serotonin Syndrome  . Lorazepam Other (See Comments)    Unknown allergic reaction  . Tetracyclines & Related Itching and Rash    REVIEW OF SYSTEMS:  CONSTITUTIONAL: No fever, fatigue or weakness.  EYES: No blurred or double vision.  EARS, NOSE, AND THROAT: No tinnitus or ear pain. No sore throat RESPIRATORY: Some cough, some shortness of breath.  No wheezing or hemoptysis.  CARDIOVASCULAR: Positive for chest pain.  no orthopnea, edema.  GASTROINTESTINAL: No nausea, vomiting, diarrhea.  Occasional abdominal pain. No blood in bowel movements.  Occasional constipation GENITOURINARY: No dysuria, hematuria.  ENDOCRINE: No polyuria, nocturia,  HEMATOLOGY: No anemia, easy bruising or bleeding SKIN: No rash or lesion. MUSCULOSKELETAL: No joint pain or arthritis.   NEUROLOGIC: No tingling, numbness, weakness.  PSYCHIATRY: Positive for anxiety  MEDICATIONS AT HOME:   Prior to Admission medications   Medication Sig Start Date End Date Taking? Authorizing Provider  acetaminophen (TYLENOL) 500 MG tablet Take 1,000 mg by mouth daily as needed for headache.    Yes [provider]  albuterol (PROVENTIL HFA;VENTOLIN HFA) 108 (90 Base) MCG/ACT inhaler Inhale into the lungs every 6 (six) hours as needed for wheezing or shortness of breath.   Yes  [provider]  albuterol (PROVENTIL) (2.5 MG/3ML) 0.083% nebulizer solution Take 3 mLs (2.5 mg total) by nebulization every 6 (six) hours as needed for wheezing or shortness of breath. 06/12/17  Yes Johnn Hai, PA-C  ALPRAZolam (XANAX XR) 3 MG 24 hr tablet Take 3 mg by mouth 2 (two) times daily. 1 MG IN THE MORNING AND 3 MG IN THE EVENING   Yes [provider]  ALPRAZolam (XANAX) 1 MG tablet Take 1 mg by mouth daily.  02/15/18  Yes [provider]  aspirin 81 MG EC tablet Take 1 tablet (81 mg total) by mouth daily. Patient taking differently: Take 81 mg by mouth daily. BREAKFAST 09/04/15  Yes Minna Merritts,  MD  atorvastatin (LIPITOR) 80 MG tablet TAKE 1 TABLET BY MOUTH DAILY 12/12/17  Yes Gollan, Kathlene November, MD  ezetimibe (ZETIA) 10 MG tablet Take 1 tablet (10 mg total) by mouth daily. 07/04/18  Yes Gollan, Kathlene November, MD  furosemide (LASIX) 20 MG tablet Take 20 mg by mouth daily.    Yes [provider]  gabapentin (NEURONTIN) 100 MG capsule Take 300 mg by mouth 2 (two) times daily. Take 2 pills twice a day    Yes [provider]  hydrALAZINE (APRESOLINE) 25 MG tablet Take 25 mg by mouth as needed.   Yes [provider]  isosorbide mononitrate (IMDUR) 30 MG 24 hr tablet Take 1 tablet (30 mg total) by mouth daily. 01/16/18  Yes Minna Merritts, MD  meclizine (ANTIVERT) 25 MG tablet Take 1 tablet (25 mg total) by mouth 3 (three) times daily as needed for dizziness. 05/16/18  Yes Triplett, Cari B, FNP  metoprolol succinate (TOPROL-XL) 25 MG 24 hr tablet Take 1 tablet (25 mg total) by mouth daily. 11/16/17  Yes Gollan, Kathlene November, MD  mometasone-formoterol (DULERA) 100-5 MCG/ACT AERO Inhale 2 puffs into the lungs 2 (two) times daily.  07/18/18 07/18/19 Yes [provider]  nitroGLYCERIN (NITROSTAT) 0.4 MG SL tablet Place 1 tablet (0.4 mg total) under the tongue every 5 (five) minutes as needed for chest pain. 01/16/18  Yes Gollan, Kathlene November,  MD  OXYGEN Inhale 2 L into the lungs daily as needed (oxygen).    Yes [provider]  roflumilast (DALIRESP) 500 MCG TABS tablet Take 1 tablet (500 mcg total) by mouth daily. Patient taking differently: Take 500 mcg by mouth daily. LUNCH 09/04/15  Yes Laverle Hobby, MD  tiotropium (SPIRIVA) 18 MCG inhalation capsule Place 1 capsule (18 mcg total) into inhaler and inhale daily. Patient taking differently: Place 18 mcg into inhaler and inhale daily. AM 09/04/15  Yes Laverle Hobby, MD      VITAL SIGNS:  Blood pressure 111/84, pulse (!) 113, temperature 98 F (36.7 C), temperature source Oral, resp. rate 16, height 5\' 9"  (1.753 m), weight 91.6 kg, SpO2 95 %.  PHYSICAL EXAMINATION:  GENERAL:  68 y.o.-year-old patient lying in the bed with no acute distress.  EYES: Pupils equal, round, reactive to light and accommodation. No scleral icterus. Extraocular muscles intact.  HEENT: Head atraumatic, normocephalic. Oropharynx and nasopharynx clear.  NECK:  Supple, no jugular venous distention. No thyroid enlargement, no tenderness.  LUNGS: Normal breath sounds bilaterally, no wheezing, rales,rhonchi or crepitation. No use of accessory muscles of respiration.  CARDIOVASCULAR: S1, S2 normal. No murmurs, rubs, or gallops.  ABDOMEN: Soft, nontender, nondistended. Bowel sounds present. No organomegaly or mass.  EXTREMITIES: No pedal edema, cyanosis, or clubbing.  NEUROLOGIC: Cranial nerves II through XII are intact. Muscle strength 5/5 in all extremities. Sensation intact. Gait not checked.  PSYCHIATRIC: The patient is alert and oriented x 3.  SKIN: No rash, lesion, or ulcer.   LABORATORY PANEL:   CBC Recent Labs  Lab 09/06/18 1004  WBC 9.9  HGB 10.5*  HCT 32.0*  PLT 207   ------------------------------------------------------------------------------------------------------------------  Chemistries  Recent Labs  Lab 09/06/18 1004  NA 138  K 3.2*  CL 99  CO2 29   GLUCOSE 103*  BUN 14  CREATININE 1.61*  CALCIUM 8.5*   ------------------------------------------------------------------------------------------------------------------  Cardiac Enzymes Recent Labs  Lab 09/06/18 1004  TROPONINI <0.03   ------------------------------------------------------------------------------------------------------------------  RADIOLOGY:  Dg Chest Port 1 View  Result Date: 09/06/2018 CLINICAL DATA:  Chest pain EXAM: PORTABLE CHEST 1 VIEW COMPARISON:  06/08/2018 FINDINGS: Heart is normal size. No confluent airspace opacities or effusions. No acute bony abnormality. IMPRESSION: No active disease. Electronically Signed   By: Rolm Baptise M.D.   On: 09/06/2018 10:33    EKG:   Sinus tachycardia 110 bpm  IMPRESSION AND PLAN:   1.  Chest pain with history of CAD, concerning for acute coronary syndrome.  Patient given aspirin by EMS.  We will continue aspirin on a daily basis.  Give metoprolol.  Patient has a nitro patch on.  I will get serial cardiac enzymes and monitor on telemetry.  Cardiology consult with Dr. Rockey Situ.  Cardiology to decide if they want to do a cardiac catheterization or not. 2.  COPD on chronic oxygen on D[place].  Continue inhalers. 3.  Chronic kidney disease stage III.  Hold Lasix for right now.  May end up needing IV fluids if they decide to do a cardiac catheterization. 4.  Hypertension continue Toprol. 5.  Anxiety on Xanax 6.  Hyperlipidemia unspecified continue atorvastatin.  Check a lipid profile.  All the records are reviewed and case discussed with ED provider. Management plans discussed with the patient, family and they are in agreement.  CODE STATUS: Full code  TOTAL TIME TAKING CARE OF THIS PATIENT: 50 minutes, including acp time.    Loletha Grayer M.D on 09/06/2018 at 11:29 AM  Between 7am to 6pm - Pager - [phone]  After 6pm call admission pager [phone]  [facility]  [phone]  CC: Primary care  physician; Philmore Pali, NP

## 2018-09-06 NOTE — ED Notes (Signed)
Attempted to call report- will give rolling report

## 2018-09-06 NOTE — Plan of Care (Signed)
  Problem: Urinary Elimination: Goal: Signs and symptoms of infection will decrease Outcome: Progressing   

## 2018-09-06 NOTE — ED Notes (Signed)
ED TO INPATIENT HANDOFF REPORT  ED Nurse Name and Phone #: Florella Mcneese [phone]  S Name/Age/Gender William Mueller 68 y.o. male Room/Bed: ED15A/ED15A  Code Status   Code Status: Prior  Home/SNF/Other Home Patient oriented to: self, place, time and situation Is this baseline? Yes   Triage Complete: Triage complete  Chief Complaint cp  Triage Note Pt from home via [facility]. Per EMS, pt has been experiencing mid CP 7/10  that woke him up from his sleep at midnight. Per EMS pt uses 2L SpO2 chronic at home. Prior to arrival pt took 3 nitro pills at home an 4 nitro pills, 324mg  aspirin given by EMS. Pt st hx of "heart attack on 2015". NAD noted upon arrival. EDP at bedside.    Allergies Allergies  Allergen Reactions  . Paroxetine Hcl Shortness Of Breath and Palpitations  . Serotonin Reuptake Inhibitors (Ssris) Shortness Of Breath and Palpitations  . Diazepam Other (See Comments)    Rapid heartrate  . Doxycycline Monohydrate Other (See Comments)    Unknown allergic reaction  . Escitalopram Oxalate Other (See Comments)    serotonin Syndrome  . Lorazepam Other (See Comments)    Unknown allergic reaction  . Tetracyclines & Related Itching and Rash    Level of Care/Admitting Diagnosis ED Disposition    ED Disposition Condition [facility]: [facility] [[id number]]  Level of Care: Telemetry [5]  Diagnosis: Chest pain [902409]  Admitting Physician: Loletha Grayer [[id number]]  Attending Physician: Loletha Grayer [phone]  PT Class (Do Not Modify): Observation [104]  PT Acc Code (Do Not Modify): Observation [10022]       B Medical/Surgery History Past Medical History:  Diagnosis Date  . Anginal pain ([place]) 06/2017  . Anxiety   . Bronchitis 06/2017  . Chronic chest pain   . Chronic kidney disease (CKD) stage G3b/A1, moderately decreased glomerular filtration rate (GFR) between 30-44 mL/min/1.73 square meter and albuminuria creatinine ratio  less than 30 mg/g (HCC)   . Chronic lower back pain    WITH LEG WEAKNESS  . COPD (chronic obstructive pulmonary disease) (HCC)    EMPHYSEMA, O2 AT 2L PRN  . Coronary artery disease    a. 12/2013 PCI: mRCA 100% with L to R collats s/p PCI/DES;  b. 06/2014 MV: no ischemia/infarct, EF 53%;  c. 03/2015 Cath: LM nl, LAD nl, D1 80 (1.31mm), LCX 34m (<1.77mm), OM1 nl, RCA 55p, patent stent, RPDA nl, EF 65%; d. 09/2015 Cath: LM nl, LAD 23m, D1 80 (<75mm), LCX 27m/d (<1.39mm), OM1 nl, RCA 55p (FFR 0.93), patent stent, RPDA nl-->Med Rx.  . Daily headache   . Depression   . Diastolic dysfunction    a. echo 12/2013: EF 55-60%, mild LVH, GR1DD, inf HK, elevated CVP, mildly dilated IVC suggestive of increased RA pressure  . Dyspnea    WHEEZING  . GERD (gastroesophageal reflux disease)    REFLUX  . Hearing loss   . High cholesterol   . Hypertension   . Myocardial infarction ([place]) 2015  . Nicotine addiction    a. using eCigs.  . Orthopnea   . Sleep apnea    Past Surgical History:  Procedure Laterality Date  . CARDIAC CATHETERIZATION     MC x 1 stent  . CARDIAC CATHETERIZATION Left 03/12/2015   Procedure: Left Heart Cath and Coronary Angiography;  Surgeon: Leonie Man, MD;  Location: [facility];  Service: Cardiovascular;  Laterality: Left;  . CARDIAC CATHETERIZATION N/A  09/29/2015   Procedure: Left Heart Cath and Coronary Angiography;  Surgeon: Wellington Hampshire, MD;  Location: [facility];  Service: Cardiovascular;  Laterality: N/A;  . CATARACT EXTRACTION W/PHACO Right 08/10/2017   Procedure: CATARACT EXTRACTION PHACO AND INTRAOCULAR LENS PLACEMENT ([place]) RIGHT;  Surgeon: Leandrew Koyanagi, MD;  Location: [facility];  Service: Ophthalmology;  Laterality: Right;  . CORONARY ANGIOPLASTY     STENT PLACEMENT  . LEFT HEART CATHETERIZATION WITH CORONARY ANGIOGRAM N/A 12/12/2013   Procedure: LEFT HEART CATHETERIZATION WITH CORONARY ANGIOGRAM;  Surgeon: Wellington Hampshire, MD;   Location: [facility];  Service: Cardiovascular;  Laterality: N/A;     A IV Location/Drains/Wounds Patient Lines/Drains/Airways Status   Active Line/Drains/Airways    Name:   Placement date:   Placement time:   Site:   Days:   Peripheral IV 09/06/18 Left Antecubital   09/06/18    1000    Antecubital   less than 1   Incision (Closed) 08/10/17 Eye Right   08/10/17    1002     392          Intake/Output Last 24 hours No intake or output data in the 24 hours ending 09/06/18 1109  Labs/Imaging Results for orders placed or performed during the hospital encounter of 09/06/18 (from the past 48 hour(s))  Basic metabolic panel     Status: Abnormal   Collection Time: 09/06/18 10:04 AM  Result Value Ref Range   Sodium 138 135 - 145 mmol/L   Potassium 3.2 (L) 3.5 - 5.1 mmol/L   Chloride 99 98 - 111 mmol/L   CO2 29 22 - 32 mmol/L   Glucose, Bld 103 (H) 70 - 99 mg/dL   BUN 14 8 - 23 mg/dL   Creatinine, Ser 1.61 (H) 0.61 - 1.24 mg/dL   Calcium 8.5 (L) 8.9 - 10.3 mg/dL   GFR calc non Af Amer 43 (L) >60 mL/min   GFR calc Af Amer 50 (L) >60 mL/min   Anion gap 10 5 - 15    Comment: Performed at [facility], [address] [place], [place] [address]  CBC     Status: Abnormal   Collection Time: 09/06/18 10:04 AM  Result Value Ref Range   WBC 9.9 4.0 - 10.5 K/uL   RBC 3.73 (L) 4.22 - 5.81 MIL/uL   Hemoglobin 10.5 (L) 13.0 - 17.0 g/dL   HCT 32.0 (L) 39.0 - 52.0 %   MCV 85.8 80.0 - 100.0 fL   MCH 28.2 26.0 - 34.0 pg   MCHC 32.8 30.0 - 36.0 g/dL   RDW 12.7 11.5 - 15.5 %   Platelets 207 150 - 400 K/uL   nRBC 0.0 0.0 - 0.2 %    Comment: Performed at [facility], [address] [place], [place] [address]  Troponin I - Once     Status: None   Collection Time: 09/06/18 10:04 AM  Result Value Ref Range   Troponin I <0.03 <0.03 ng/mL    Comment: Performed at [facility], [address], [place],  [address]   Dg Chest Port 1 View  Result Date:  09/06/2018 CLINICAL DATA:  Chest pain EXAM: PORTABLE CHEST 1 VIEW COMPARISON:  06/08/2018 FINDINGS: Heart is normal size. No confluent airspace opacities or effusions. No acute bony abnormality. IMPRESSION: No active disease. Electronically Signed   By: Rolm Baptise M.D.   On: 09/06/2018 10:33    Pending Labs Unresulted Labs (From admission, onward)  Start     Ordered   09/07/18 0500  Lipid panel  Tomorrow morning,   STAT     09/06/18 1100   09/06/18 1800  Troponin I - Once  Once,   STAT     09/06/18 1100   09/06/18 1400  Troponin I - Once  Once,   STAT     09/06/18 1100          Vitals/Pain Today's Vitals   09/06/18 0955 09/06/18 0956 09/06/18 1046  BP: 111/84    Pulse: (!) 113    Resp: 16    Temp: 98 F (36.7 C)    TempSrc: Oral    SpO2: 95%    Weight:  91.6 kg   Height:  5\' 9"  (1.753 m)   PainSc:  7  6     Isolation Precautions No active isolations  Medications Medications  potassium chloride SA (K-DUR,KLOR-CON) CR tablet 40 mEq (has no administration in time range)  aspirin EC tablet 81 mg (has no administration in time range)  morphine 4 MG/ML injection 4 mg (4 mg Intravenous Given 09/06/18 1048)  nitroGLYCERIN (NITROGLYN) 2 % ointment 1 inch (1 inch Topical Given 09/06/18 1051)    Mobility walks Low fall risk   Focused Assessments Cardiac Assessment Handoff:    Lab Results  Component Value Date   CKTOTAL 137 05/04/2014   CKMB 5.2 (H) 11/19/2012   TROPONINI <0.03 09/06/2018   No results found for: DDIMER Does the Patient currently have chest pain? No     R Recommendations: See Admitting Provider Note  Report given to:   Additional Notes:

## 2018-09-06 NOTE — ED Triage Notes (Addendum)
Pt from home via [facility]. Per EMS, pt has been experiencing mid CP 7/10  that woke him up from his sleep at midnight. Per EMS pt uses 2L SpO2 chronic at home for sleep. Prior to arrival pt took 3 nitro pills at home an 4 nitro pills, 324mg  aspirin given by EMS. Pt st hx of "heart attack on 2015". NAD noted upon arrival. EDP at bedside.

## 2018-09-06 NOTE — Consult Note (Signed)
Cardiology Consultation:   Patient ID: DAMIAN BUCKLES MRN: [id number]; DOB: 1950-09-24  Admit date: 09/06/2018 Date of Consult: 09/06/2018  Primary Care Provider: Philmore Pali, NP Primary Cardiologist: Ida Rogue, MD  Primary Electrophysiologist:  None    Patient Profile:   William Mueller is a 68 y.o. male with a hx of CAD s/p 2015 PCI to RCA and repeat catheterization 2016 /2017, chronic chest pain with negative enzymes, atrial tachycardia, COPD, obesity, hypertension, hyperlipidemia, chronic diastolic CHF, CKD 3, GERD, anxiety, and history of smoking with current e-cigarette use who is being seen today for the evaluation of chest pain with negative troponin at the request of Dr. Leslye Peer.  History of Present Illness:   William Mueller is a 68 year old male with PMH as above.  He reportedly lives a sedentary lifestyle without any exercise program due to weight and arthritic issues. He currently smokes E cigarettes. He has a h/o CAD with 12/2013 cardiac catheterization PCI/DES to RCA.  He underwent repeat cardiac cath in October 2016 for chest pain that showed patent stent and disease of small diagonal and distal circumflex but not amenable to intervention.  His last cardiac catheterization 09/2015 for chest pain showed patent RCA stent and stable disease to proximal LAD, small first diagonal and the AV groove branch of the left circumflex with recommendation for medical therapy. He was last admitted 09/2017 for chest pain and ruled out with medical therapy advised.  Most recent echo below with grade 2 diastolic dysfunction and normal EF. He is followed by Dr. Rockey Situ of [facility] cardiology with last appointment 11/16/2017.  At that time, he complained of occasional chest pain and shortness of breath and was taking nitroglycerin for this pain.  He was not using his oxygen with exertion. He also reported fast heart rates at times, specifically waking him in the morning and associated with hypoxemia per home pulse  ox.  EKG showed NSR, 95 bpm, ST/T wave abnormality V4 through V6, 1, and aVL.  Since that time, he has had an increasing number of morning episodes of chest pain that wake him from sleep and require an increasing amount of nitro for chest pain relief.    On 09/06/2018, he awoke with chest pain, originally rated 7/10-10/10 in intensity.  He reported nonpleuritic central pain/pressure in his chest and radiating up into his bilateral neck.  He stated "the chest pains felt similar to my heart attack in 2015." Associated symptoms included shortness of breath and weakness.  He denied any nausea, vomiting, presyncope, or syncope.  He reportedly took 3 nitroglycerin at home without relief and called EMS.  On arrival, EMS noted his blood pressure was high and administered aspirin and additional nitroglycerin.  His chest pain was alleviated completely with morphine and placement of nitroglycerin patch.  In [facility] ED, vitals significant for heart rate 113 & blood pressure 111/84.  EKG without acute changes but showing sinus tachycardia at 110 bpm, non-specific ST/T changes, and LVH with poor R wave progression. CXR without acute abnormality. Labs significant for negative troponin x1, hemoglobin 10.5, potassium 3.2, glucose 103, creatinine 1.61 with baseline 1.5-1.6. No current chest pain.  Past Medical History:  Diagnosis Date   Anginal pain ([place]) 06/2017   Anxiety    Bronchitis 06/2017   Chronic chest pain    Chronic kidney disease (CKD) stage G3b/A1, moderately decreased glomerular filtration rate (GFR) between 30-44 mL/min/1.73 square meter and albuminuria creatinine ratio less than 30 mg/g (HCC)    Chronic lower  back pain    WITH LEG WEAKNESS   COPD (chronic obstructive pulmonary disease) (HCC)    EMPHYSEMA, O2 AT 2L PRN   Coronary artery disease    a. 12/2013 PCI: mRCA 100% with L to R collats s/p PCI/DES;  b. 06/2014 MV: no ischemia/infarct, EF 53%;  c. 03/2015 Cath: LM nl, LAD nl, D1 80 (1.69mm),  LCX 31m (<1.59mm), OM1 nl, RCA 55p, patent stent, RPDA nl, EF 65%; d. 09/2015 Cath: LM nl, LAD 78m, D1 80 (<70mm), LCX 47m/d (<1.3mm), OM1 nl, RCA 55p (FFR 0.93), patent stent, RPDA nl-->Med Rx.   Daily headache    Depression    Diastolic dysfunction    a. echo 12/2013: EF 55-60%, mild LVH, GR1DD, inf HK, elevated CVP, mildly dilated IVC suggestive of increased RA pressure   Dyspnea    WHEEZING   GERD (gastroesophageal reflux disease)    REFLUX   Hearing loss    High cholesterol    Hypertension    Myocardial infarction ([place]) 2015   Nicotine addiction    a. using eCigs.   Orthopnea    Sleep apnea     Past Surgical History:  Procedure Laterality Date   CARDIAC CATHETERIZATION     MC x 1 stent   CARDIAC CATHETERIZATION Left 03/12/2015   Procedure: Left Heart Cath and Coronary Angiography;  Surgeon: Leonie Man, MD;  Location: [place] CV [facility];  Service: Cardiovascular;  Laterality: Left;   CARDIAC CATHETERIZATION N/A 09/29/2015   Procedure: Left Heart Cath and Coronary Angiography;  Surgeon: Wellington Hampshire, MD;  Location[facility];  Service: Cardiovascular;  Laterality: N/A;   CATARACT EXTRACTION W/PHACO Right 08/10/2017   Procedure: CATARACT EXTRACTION PHACO AND INTRAOCULAR LENS PLACEMENT ([place]) RIGHT;  Surgeon: Leandrew Koyanagi, MD;  Location: [place];  Service: Ophthalmology;  Laterality: Right;   CORONARY ANGIOPLASTY     STENT PLACEMENT   LEFT HEART CATHETERIZATION WITH CORONARY ANGIOGRAM N/A 12/12/2013   Procedure: LEFT HEART CATHETERIZATION WITH CORONARY ANGIOGRAM;  Surgeon: Wellington Hampshire, MD;  Location: [place] CATH LAB;  Service: Cardiovascular;  Laterality: N/A;     Home Medications:  Prior to Admission medications   Medication Sig Start Date End Date Taking? Authorizing Provider  acetaminophen (TYLENOL) 500 MG tablet Take 1,000 mg by mouth daily as needed for headache.    Yes [provider]  albuterol (PROVENTIL  HFA;VENTOLIN HFA) 108 (90 Base) MCG/ACT inhaler Inhale into the lungs every 6 (six) hours as needed for wheezing or shortness of breath.   Yes [provider]  albuterol (PROVENTIL) (2.5 MG/3ML) 0.083% nebulizer solution Take 3 mLs (2.5 mg total) by nebulization every 6 (six) hours as needed for wheezing or shortness of breath. 06/12/17  Yes Johnn Hai, PA-C  ALPRAZolam (XANAX XR) 3 MG 24 hr tablet Take 3 mg by mouth 2 (two) times daily. 1 MG IN THE MORNING AND 3 MG IN THE EVENING   Yes [provider]  ALPRAZolam (XANAX) 1 MG tablet Take 1 mg by mouth daily.  02/15/18  Yes [provider]  aspirin 81 MG EC tablet Take 1 tablet (81 mg total) by mouth daily. Patient taking differently: Take 81 mg by mouth daily. BREAKFAST 09/04/15  Yes Gollan, Kathlene November, MD  atorvastatin (LIPITOR) 80 MG tablet TAKE 1 TABLET BY MOUTH DAILY 12/12/17  Yes Gollan, Kathlene November, MD  ezetimibe (ZETIA) 10 MG tablet Take 1 tablet (10 mg total) by mouth daily. 07/04/18  Yes Ida Rogue  J, MD  furosemide (LASIX) 20 MG tablet Take 20 mg by mouth daily.    Yes [provider]  gabapentin (NEURONTIN) 100 MG capsule Take 300 mg by mouth 2 (two) times daily. Take 2 pills twice a day    Yes [provider]  hydrALAZINE (APRESOLINE) 25 MG tablet Take 25 mg by mouth as needed.   Yes [provider]  isosorbide mononitrate (IMDUR) 30 MG 24 hr tablet Take 1 tablet (30 mg total) by mouth daily. 01/16/18  Yes Minna Merritts, MD  meclizine (ANTIVERT) 25 MG tablet Take 1 tablet (25 mg total) by mouth 3 (three) times daily as needed for dizziness. 05/16/18  Yes Triplett, Cari B, FNP  metoprolol succinate (TOPROL-XL) 25 MG 24 hr tablet Take 1 tablet (25 mg total) by mouth daily. 11/16/17  Yes Gollan, Kathlene November, MD  mometasone-formoterol (DULERA) 100-5 MCG/ACT AERO Inhale 2 puffs into the lungs 2 (two) times daily.  07/18/18 07/18/19 Yes [provider]  nitroGLYCERIN (NITROSTAT) 0.4  MG SL tablet Place 1 tablet (0.4 mg total) under the tongue every 5 (five) minutes as needed for chest pain. 01/16/18  Yes Gollan, Kathlene November, MD  OXYGEN Inhale 2 L into the lungs daily as needed (oxygen).    Yes [provider]  roflumilast (DALIRESP) 500 MCG TABS tablet Take 1 tablet (500 mcg total) by mouth daily. Patient taking differently: Take 500 mcg by mouth daily. LUNCH 09/04/15  Yes Laverle Hobby, MD  tiotropium (SPIRIVA) 18 MCG inhalation capsule Place 1 capsule (18 mcg total) into inhaler and inhale daily. Patient taking differently: Place 18 mcg into inhaler and inhale daily. AM 09/04/15  Yes Laverle Hobby, MD    Inpatient Medications: Scheduled Meds:  ALPRAZolam  3 mg Oral QHS   ALPRAZolam  1 mg Oral Daily   [START ON 09/07/2018] aspirin EC  81 mg Oral Daily   atorvastatin  80 mg Oral Daily   ezetimibe  10 mg Oral Daily   gabapentin  300 mg Oral BID   metoprolol succinate  25 mg Oral Daily   mometasone-formoterol  2 puff Inhalation BID   nitroGLYCERIN  0.5 inch Topical Q6H   potassium chloride  40 mEq Oral Once   roflumilast  500 mcg Oral Daily   tiotropium  18 mcg Inhalation Daily   Continuous Infusions:  PRN Meds: acetaminophen **OR** acetaminophen, albuterol, hydrALAZINE, meclizine, nitroGLYCERIN, ondansetron **OR** ondansetron (ZOFRAN) IV  Allergies:    Allergies  Allergen Reactions   Paroxetine Hcl Shortness Of Breath and Palpitations   Serotonin Reuptake Inhibitors (Ssris) Shortness Of Breath and Palpitations   Diazepam Other (See Comments)    Rapid heartrate   Doxycycline Monohydrate Other (See Comments)    Unknown allergic reaction   Escitalopram Oxalate Other (See Comments)    serotonin Syndrome   Lorazepam Other (See Comments)    Unknown allergic reaction   Tetracyclines & Related Itching and Rash    Social History:   Social History   Socioeconomic History   Marital status: Married    Spouse name: Not on  file   Number of children: Not on file   Years of education: Not on file   Highest education level: Not on file  Occupational History   Not on file  Social Needs   Financial resource strain: Not on file   Food insecurity:    Worry: Not on file    Inability: Not on file   Transportation needs:    Medical: Not on  file    Non-medical: Not on file  Tobacco Use   Smoking status: Former Smoker    Years: 48.00    Types: E-cigarettes    Last attempt to quit: 06/07/2009    Years since quitting: 9.2   Smokeless tobacco: Never Used   Tobacco comment: Using electronic cigarette  Substance and Sexual Activity   Alcohol use: Yes    Alcohol/week: 6.0 standard drinks    Types: 6 Cans of beer per week    Comment: Occasional    Drug use: No   Sexual activity: Not Currently  Lifestyle   Physical activity:    Days per week: Not on file    Minutes per session: Not on file   Stress: Not on file  Relationships   Social connections:    Talks on phone: Not on file    Gets together: Not on file    Attends religious service: Not on file    Active member of club or organization: Not on file    Attends meetings of clubs or organizations: Not on file    Relationship status: Not on file   Intimate partner violence:    Fear of current or ex partner: Not on file    Emotionally abused: Not on file    Physically abused: Not on file    Forced sexual activity: Not on file  Other Topics Concern   Not on file  Social History Narrative   Not on file    Family History:    Family History  Problem Relation Age of Onset   Coronary artery disease Brother    Heart disease Brother 51   CVA Mother    Heart disease Father        MI x 8   Coronary artery disease Father    Kidney disease Neg Hx    Prostate cancer Neg Hx      ROS:  Please see the history of present illness.   Review of Systems  Constitutional: Negative for chills, fever, malaise/fatigue and weight loss.    Respiratory: Positive for shortness of breath.        Chronic SOB. Home oxygen  Cardiovascular: Positive for chest pain. Negative for palpitations and leg swelling.       Central pain / pressure; radiation to neck.  Pain rated 7/10-10/10. Woke in AM.  "Similar to last heart attack." Relieved with nitro and morphine.  No current pain.  Gastrointestinal: Positive for heartburn. Negative for blood in stool, diarrhea, melena, nausea and vomiting.       H/o GERD  Genitourinary: Negative for dysuria and hematuria.  Musculoskeletal: Positive for neck pain. Negative for falls.       CP radiated to neck. Not current.  Neurological: Positive for weakness. Negative for tingling, tremors, sensory change, speech change, focal weakness and loss of consciousness.  Psychiatric/Behavioral: The patient is nervous/anxious.   All other systems reviewed and are negative.   All other ROS reviewed and negative.     Physical Exam/Data:   Vitals:   09/06/18 0955 09/06/18 0956  BP: 111/84   Pulse: (!) 113   Resp: 16   Temp: 98 F (36.7 C)   TempSrc: Oral   SpO2: 95%   Weight:  91.6 kg  Height:  5\' 9"  (1.753 m)   No intake or output data in the 24 hours ending 09/06/18 1152 Filed Weights   09/06/18 0956  Weight: 91.6 kg   Body mass index is 29.83 kg/m.  See  MD attestation for physical exam in setting of COVID-19 precautions to reduces touches.   EKG: EKG without acute changes but showing sinus tachycardia at 110 bpm, non-specific ST/T changes, and LVH with poor R wave progression. Telemetry:  Telemetry was personally reviewed and demonstrates:  Sinus tachycardia  CV Studies:   Relevant CV Studies: Left Heart Cath and Coronary Angiography  09/29/2015  Conclusion    Mid Cx to Dist Cx lesion, 90% stenosed.  1st Diag lesion, 80% stenosed.  Prox RCA lesion, 55% stenosed. The lesion was not previously treated.  Prox LAD to Mid LAD lesion, 40% stenosed.  1. Widely patent mid RCA stent.  Stable proximal moderate RCA stenosis not significant by pressure wire interrogation. Significant disease affecting a small first diagonal and the AV groove branch of the left circumflex. Both of these are unchanged from before. There is mild to moderate calcified proximal to mid LAD disease which is also unchanged.  2. Mild to moderate elevation of left ventricular end-diastolic pressure.  Recommendations: Continue aggressive medical therapy. Can discharge home later today after 6 hours of hydration. Only 45 mL of contrast was used.   TTE 09/30/2017 Study Conclusions - Left ventricle: The cavity size was normal. Wall thickness was   normal. Systolic function was vigorous. The estimated ejection   fraction was in the range of 65% to 70%. Wall motion was normal;   there were no regional wall motion abnormalities. Features are   consistent with a pseudonormal left ventricular filling pattern,   with concomitant abnormal relaxation and increased filling   pressure (grade 2 diastolic dysfunction).  Laboratory Data:  Chemistry Recent Labs  Lab 09/06/18 1004  NA 138  K 3.2*  CL 99  CO2 29  GLUCOSE 103*  BUN 14  CREATININE 1.61*  CALCIUM 8.5*  GFRNONAA 43*  GFRAA 50*  ANIONGAP 10    No results for input(s): PROT, ALBUMIN, AST, ALT, ALKPHOS, BILITOT in the last 168 hours. Hematology Recent Labs  Lab 09/06/18 1004  WBC 9.9  RBC 3.73*  HGB 10.5*  HCT 32.0*  MCV 85.8  MCH 28.2  MCHC 32.8  RDW 12.7  PLT 207   Cardiac Enzymes Recent Labs  Lab 09/06/18 1004  TROPONINI <0.03   No results for input(s): TROPIPOC in the last 168 hours.  BNPNo results for input(s): BNP, PROBNP in the last 168 hours.  DDimer No results for input(s): DDIMER in the last 168 hours.  Radiology/Studies:  Dg Chest Port 1 View  Result Date: 09/06/2018 CLINICAL DATA:  Chest pain EXAM: PORTABLE CHEST 1 VIEW COMPARISON:  06/08/2018 FINDINGS: Heart is normal size. No confluent airspace opacities or  effusions. No acute bony abnormality. IMPRESSION: No active disease. Electronically Signed   By: Rolm Baptise M.D.   On: 09/06/2018 10:33    Assessment and Plan:   Unstable Angina with h/o CAD s/p 2015 PCI - No current chest pain. 7/10 chest pain earlier today that woke from sleep as in HPI. H/o chronic chest pain, as well as CAD with 12/2013 PCI & 2017 cath as above. Moderate risk for cardiac etiology with risk factors for cardiac etiology include sedentary lifestyle, h/o CAD, current smoker/ecigs, HLD, HTN. EKG without acute changes. Troponin negative x1 and cycling. Pending updated A1C for additional risk stratification.  - NPO in preparation for LHC.  - Continue heparin. Daily CBC/BMET.  - Continue ASA, BB, Zetia, and Lipitor. Nitro and morphine as needed for chest pain.  - Scheduled for LHC today 4/1. Further  recommendations following cath. Risks and benefits of cardiac catheterization have been discussed with the patient.  These include bleeding, infection, kidney damage, stroke, heart attack, death.  The patient understands these risks and is willing to proceed.   Sinus Tachycardia with h/o atrial tachycardia - Continue BB / Toprol XL 25mg  daily and titrate as BP allows for HR control. Pending updated TSH. Continue to replete electrolytes as below. Receiving fluids in preparation for catheterization with Cr 1.61 / CKD as below. Continue gentle hydration. Monitor telemetry.  Hypokalemia - K 3.2. Replete with goal 4.0. Checking Mg with goal 2.0. Daily BMET.  Diastolic dysfunction - Not volume overloaded on exam. 2019 echo as above with normal EF and G2DD above. Continue to hold PTA lasix as below in preparation for cath.  Continue BB as BP allows.   HTN - Currently hypotensive. If significant hypotension, hold BB.  - PRN hydralazine if hypertensive.   HLD - Lipid panel scheduled for tomorrow AM - Continue statin, Zetia.  CKD III - Cr 1.61. Baseline 1.5-1.6. Holding PTA lasix.  Continue to hold in preparation for cath given Cr and as not volume overloaded on exam. Daily BMET.  OSA/COPD - Chronic SOB with home oxygen.  - Continue O2, breathing treatments, and CPAP as needed and per IM  Nicotine abuse - E-cigarettes. Cessation advised   Anxiety - Continue Xanax per IM   For questions or updates, please contact [place] Please consult [email or web address] for contact info under     Signed, Arvil Chaco, PA-C  09/06/2018 11:52 AM

## 2018-09-07 ENCOUNTER — Encounter: Payer: Self-pay | Admitting: Cardiovascular Disease

## 2018-09-07 ENCOUNTER — Other Ambulatory Visit: Payer: Self-pay

## 2018-09-07 DIAGNOSIS — I471 Supraventricular tachycardia: Secondary | ICD-10-CM | POA: Diagnosis not present

## 2018-09-07 DIAGNOSIS — I200 Unstable angina: Secondary | ICD-10-CM | POA: Diagnosis not present

## 2018-09-07 DIAGNOSIS — I10 Essential (primary) hypertension: Secondary | ICD-10-CM | POA: Diagnosis not present

## 2018-09-07 DIAGNOSIS — J432 Centrilobular emphysema: Secondary | ICD-10-CM | POA: Diagnosis not present

## 2018-09-07 DIAGNOSIS — I25110 Atherosclerotic heart disease of native coronary artery with unstable angina pectoris: Secondary | ICD-10-CM | POA: Diagnosis not present

## 2018-09-07 DIAGNOSIS — N183 Chronic kidney disease, stage 3 (moderate): Secondary | ICD-10-CM | POA: Diagnosis not present

## 2018-09-07 DIAGNOSIS — S8011XA Contusion of right lower leg, initial encounter: Secondary | ICD-10-CM | POA: Diagnosis not present

## 2018-09-07 LAB — CBC
HCT: 37.3 % — ABNORMAL LOW (ref 39.0–52.0)
Hemoglobin: 12 g/dL — ABNORMAL LOW (ref 13.0–17.0)
MCH: 27.6 pg (ref 26.0–34.0)
MCHC: 32.2 g/dL (ref 30.0–36.0)
MCV: 85.9 fL (ref 80.0–100.0)
Platelets: 254 10*3/uL (ref 150–400)
RBC: 4.34 MIL/uL (ref 4.22–5.81)
RDW: 12.7 % (ref 11.5–15.5)
WBC: 9.7 10*3/uL (ref 4.0–10.5)
nRBC: 0 % (ref 0.0–0.2)

## 2018-09-07 LAB — BASIC METABOLIC PANEL
Anion gap: 8 (ref 5–15)
BUN: 15 mg/dL (ref 8–23)
CO2: 27 mmol/L (ref 22–32)
Calcium: 8.5 mg/dL — ABNORMAL LOW (ref 8.9–10.3)
Chloride: 102 mmol/L (ref 98–111)
Creatinine, Ser: 1.68 mg/dL — ABNORMAL HIGH (ref 0.61–1.24)
GFR calc Af Amer: 47 mL/min — ABNORMAL LOW (ref >60)
GFR calc non Af Amer: 41 mL/min — ABNORMAL LOW (ref >60)
Glucose, Bld: 97 mg/dL (ref 70–99)
Potassium: 3.5 mmol/L (ref 3.5–5.1)
Sodium: 137 mmol/L (ref 135–145)

## 2018-09-07 LAB — LIPID PANEL
Cholesterol: 98 mg/dL (ref 0–200)
HDL: 31 mg/dL — ABNORMAL LOW (ref >40)
LDL Cholesterol: 52 mg/dL (ref 0–99)
Total CHOL/HDL Ratio: 3.2 RATIO
Triglycerides: 75 mg/dL (ref <150)
VLDL: 15 mg/dL (ref 0–40)

## 2018-09-07 LAB — HIV ANTIBODY (ROUTINE TESTING W REFLEX): HIV Screen 4th Generation wRfx: NONREACTIVE

## 2018-09-07 MED ORDER — RANOLAZINE ER 1000 MG PO TB12: ORAL_TABLET | ORAL | 0 refills | Status: DC

## 2018-09-07 MED ORDER — RANOLAZINE ER 500 MG PO TB12
500.0000 mg | ORAL_TABLET | Freq: Two times a day (BID) | ORAL | Status: DC
  Administered 2018-09-07: 500 mg via ORAL
  Filled 2018-09-07 (×2): qty 1

## 2018-09-07 NOTE — TOC Initial Note (Signed)
Transition of Care [facility]) - Initial/Assessment Note    Patient Details  Name: William Mueller MRN: [id number] Date of Birth: Oct 01, 1950  Transition of Care [facility]) CM/SW Contact:    Elza Rafter, RN Phone Number: 09/07/2018, 10:37 AM  Clinical Narrative:    Permission given to [facility] to speak with wife [name], [phone].  Patient is discharging to home today.  Chronic oxygen at 2L. Current with his PCP.  Obtains medications at [facility] and [facility] without difficulty.  He lives at home with his wife.  He does not get out of the chair much.  Does not use any DME.  Offered home health services; referral made to [facility] for RN, PT and aide.  Patient is active with [facility]; SW follows him.  They are in the process of getting meals on wheels.  Patient is discharging today.  Asked MD for home health orders.             Expected Discharge Plan: [place] Barriers to Discharge: No Barriers Identified   Patient Goals and CMS Choice Patient states their goals for this hospitalization and ongoing recovery are:: spoke with patients wife; she would like home health services; does not have a preference CMS Medicare.gov Compare Post Acute Care list provided to:: Patient Represenative (must comment)(spouse.) Choice offered to / list presented to : Spouse  Expected Discharge Plan and Services Expected Discharge Plan: [place]   Discharge Planning Services: CM Consult Post Acute Care Choice: [place] arrangements for the past 2 months: Single Family Home Expected Discharge Date: 09/07/18                   HH Arranged: RN, PT, Nurse's Aide [place] Agency: [facility] ([place])  Prior Living Arrangements/Services Living arrangements for the past 2 months: Single Family Home Lives with:: Spouse   Do you feel safe going back to the place where you live?: Yes            Criminal Activity/Legal Involvement Pertinent to Current  Situation/Hospitalization: No - Comment as needed  Activities of Daily Living Home Assistive Devices/Equipment: None ADL Screening (condition at time of admission) Patient's cognitive ability adequate to safely complete daily activities?: Yes Is the patient deaf or have difficulty hearing?: No Does the patient have difficulty seeing, even when wearing glasses/contacts?: No Does the patient have difficulty concentrating, remembering, or making decisions?: No Patient able to express need for assistance with ADLs?: No Does the patient have difficulty dressing or bathing?: No Independently performs ADLs?: No Communication: Independent Dressing (OT): Independent Grooming: Independent Feeding: Independent Bathing: Independent Toileting: Independent In/Out Bed: Independent Walks in Home: Independent Does the patient have difficulty walking or climbing stairs?: No Weakness of Legs: None Weakness of Arms/Hands: None  Permission Sought/Granted Permission sought to share information with : Family Supports Permission granted to share information with : Yes, Verbal Permission Granted  Share Information with NAME: Ross Ludwig            Emotional Assessment   Attitude/Demeanor/Rapport: Gracious Affect (typically observed): Accepting Orientation: : Oriented to Self, Oriented to Place, Oriented to  Time, Oriented to Situation Alcohol / Substance Use: Not Applicable    Admission diagnosis:  Nonspecific chest pain [R07.9] Patient Active Problem List   Diagnosis Date Noted  . Chest pain 09/30/2017  . Hypokalemia 09/29/2017  . Near syncope 09/29/2017  . Orthostatic hypotension 09/29/2017  . Nicotine addiction   . CAD  S/P PCI DES to RCA 03/11/2015  . Diastolic dysfunction   . Chronic chest pain   . Chest pain at rest 11/23/2014  . GERD (gastroesophageal reflux disease) 06/08/2014  . CKD (chronic kidney disease) stage 3, GFR 30-59 ml/min (HCC) 05/04/2014  . Bilateral leg pain  05/04/2014  . Lung nodule < 6cm on CT 05/04/2014  . Abnormal CT scan, kidney 05/04/2014  . Unstable angina ([place]) 05/04/2014  . Atrial tachycardia, paroxysmal ([place]) 12/14/2013  . Atherosclerosis of native coronary artery with unstable angina pectoris ([place]) 12/13/2013  . Labile essential hypertension 12/11/2013  . Acute renal failure superimposed on stage 3 chronic kidney disease ([place]) 12/11/2013  . Hyperlipidemia 12/11/2013  . COPD (chronic obstructive pulmonary disease) ([place]) 12/11/2013   PCP:  Philmore Pali, NP Pharmacy:   [facility] [address], [place] [address] Phone: [phone] Fax: [place], [place] - [address] [place] [address] Phone: [phone] Fax: [phone]  [place], [place] - [place] [address] Phone: [phone] Fax: [place], [place] [address] Phone: [phone] Fax: [phone]     Social Determinants of Health (SDOH) Interventions    Readmission Risk Interventions No flowsheet data found.

## 2018-09-07 NOTE — TOC Transition Note (Signed)
Transition of Care [facility]) - CM/SW Discharge Note   Patient Details  Name: William Mueller MRN: [id number] Date of Birth: 1951/02/17  [facility]) CM/SW Contact:  Elza Rafter, RN Phone Number: 09/07/2018, 11:15 AM   Clinical Narrative:   Discharging to home today with wife.  Home health has been set up with [facility].      Final next level of care: Home w Home Health Services Barriers to Discharge: No Barriers Identified   Patient Goals and CMS Choice Patient states their goals for this hospitalization and ongoing recovery are:: spoke with patients wife; she would like home health services; does not have a preference CMS Medicare.gov Compare Post Acute Care list provided to:: Patient Represenative (must comment)(spouse.) Choice offered to / list presented to : Spouse  Discharge Placement                       Discharge Plan and Services In-house Referral: [facility] Discharge Planning Services: CM Consult Post Acute Care Choice: Home Health              HH Arranged: RN, PT, Nurse's Aide [place] Agency: [place] ([place])   Social Determinants of Health (SDOH) Interventions     Readmission Risk Interventions No flowsheet data found.

## 2018-09-07 NOTE — Progress Notes (Signed)
Pt discharged to home via wc.  Instructions and rx given to pt.  Questions answered.  No distress.  

## 2018-09-07 NOTE — Discharge Summary (Signed)
[place] [facility] [place] NAME: William Mueller    MR#:  [id number]  DATE OF BIRTH:  1950/08/29  DATE OF ADMISSION:  09/06/2018 ADMITTING PHYSICIAN: Loletha Grayer, MD  DATE OF DISCHARGE: 09/07/18  PRIMARY CARE PHYSICIAN: Philmore Pali, NP    ADMISSION DIAGNOSIS:  Nonspecific chest pain [R07.9]  DISCHARGE DIAGNOSIS:  Active Problems:   Chest pain   SECONDARY DIAGNOSIS:   Past Medical History:  Diagnosis Date  . Anginal pain ([place]) 06/2017  . Anxiety   . Bronchitis 06/2017  . Chronic chest pain   . Chronic kidney disease (CKD) stage G3b/A1, moderately decreased glomerular filtration rate (GFR) between 30-44 mL/min/1.73 square meter and albuminuria creatinine ratio less than 30 mg/g (HCC)   . Chronic lower back pain    WITH LEG WEAKNESS  . COPD (chronic obstructive pulmonary disease) (HCC)    EMPHYSEMA, O2 AT 2L PRN  . Coronary artery disease    a. 12/2013 PCI: mRCA 100% with L to R collats s/p PCI/DES;  b. 06/2014 MV: no ischemia/infarct, EF 53%;  c. 03/2015 Cath: LM nl, LAD nl, D1 80 (1.65mm), LCX 50m (<1.60mm), OM1 nl, RCA 55p, patent stent, RPDA nl, EF 65%; d. 09/2015 Cath: LM nl, LAD 63m, D1 80 (<2mm), LCX 55m/d (<1.73mm), OM1 nl, RCA 55p (FFR 0.93), patent stent, RPDA nl-->Med Rx.  . Daily headache   . Depression   . Diastolic dysfunction    a. echo 12/2013: EF 55-60%, mild LVH, GR1DD, inf HK, elevated CVP, mildly dilated IVC suggestive of increased RA pressure  . Dyspnea    WHEEZING  . GERD (gastroesophageal reflux disease)    REFLUX  . Hearing loss   . High cholesterol   . Hypertension   . Myocardial infarction ([place]) 2015  . Nicotine addiction    a. using [employer].  . Orthopnea   . Sleep apnea     HOSPITAL COURSE:   Unstable angina Cardiac catheterization yesterday for unstable angina symptoms showing severe disease of small diagonal branch and distal left circumflex Stent in the RCA patent -Suspect disease detailed above  could be contributing to his symptoms Long discussion with him concerning anatomy, Anatomy not amenable to stenting small vessel size, Case discussed with interventional cardiology --Continue low-dose beta-blocker, isosorbide, statin, aspirin --Other medication options include Ranexa This has been started 500 twice daily Suggested to him he increase the dose up to 1000 twice daily after 1 week --In follow-up will suggest cardiac rehab after virus outbreak has resolved  2. Chronic renal failure Creatinine 1.6 yesterday, hydration last night Renal function little changed on today's labs  3. History of atrial tachycardia Relatively well controlled on beta-blocker, continue current dose of metoprolol Will avoid higher doses of beta-blocker given severe COPD  4. Essential hypertension Continue current medications 416 systolic this morning  5. COPD Long smoking history Uses home oxygen as needed particularly with walking May benefit from oxygen with sleeping  Long discussion with him concerning his unstable angina, various treatment options Discussed anti-anginal medications Discussed the benefits of cardiac rehab at a later date.  He is very sedentary Discharge instructions provided concerning post cath recovery  DISCHARGE CONDITIONS:   Stable.  CONSULTS OBTAINED:  Treatment Team:  Minna Merritts, MD  DRUG ALLERGIES:   Allergies  Allergen Reactions  . Paroxetine Hcl Shortness Of Breath and Palpitations  . Serotonin Reuptake Inhibitors (Ssris) Shortness Of Breath and Palpitations  . Diazepam Other (See Comments)  Rapid heartrate  . Doxycycline Monohydrate Other (See Comments)    Unknown allergic reaction  . Escitalopram Oxalate Other (See Comments)    serotonin Syndrome  . Lorazepam Other (See Comments)    Unknown allergic reaction  . Tetracyclines & Related Itching and Rash    DISCHARGE MEDICATIONS:   Allergies as of 09/07/2018      Reactions    Paroxetine Hcl Shortness Of Breath, Palpitations   Serotonin Reuptake Inhibitors (ssris) Shortness Of Breath, Palpitations   Diazepam Other (See Comments)   Rapid heartrate   Doxycycline Monohydrate Other (See Comments)   Unknown allergic reaction   Escitalopram Oxalate Other (See Comments)   serotonin Syndrome   Lorazepam Other (See Comments)   Unknown allergic reaction   Tetracyclines & Related Itching, Rash      Medication List    STOP taking these medications   isosorbide mononitrate 30 MG 24 hr tablet Commonly known as:  IMDUR     TAKE these medications   acetaminophen 500 MG tablet Commonly known as:  TYLENOL Take 1,000 mg by mouth daily as needed for headache.   albuterol 108 (90 Base) MCG/ACT inhaler Commonly known as:  PROVENTIL HFA;VENTOLIN HFA Inhale into the lungs every 6 (six) hours as needed for wheezing or shortness of breath.   albuterol (2.5 MG/3ML) 0.083% nebulizer solution Commonly known as:  PROVENTIL Take 3 mLs (2.5 mg total) by nebulization every 6 (six) hours as needed for wheezing or shortness of breath.   ALPRAZolam 3 MG 24 hr tablet Commonly known as:  XANAX XR Take 3 mg by mouth 2 (two) times daily. 1 MG IN THE MORNING AND 3 MG IN THE EVENING   ALPRAZolam 1 MG tablet Commonly known as:  XANAX Take 1 mg by mouth daily.   aspirin 81 MG EC tablet Take 1 tablet (81 mg total) by mouth daily. What changed:  additional instructions   atorvastatin 80 MG tablet Commonly known as:  LIPITOR TAKE 1 TABLET BY MOUTH DAILY   ezetimibe 10 MG tablet Commonly known as:  ZETIA Take 1 tablet (10 mg total) by mouth daily.   furosemide 20 MG tablet Commonly known as:  LASIX Take 20 mg by mouth daily.   gabapentin 100 MG capsule Commonly known as:  NEURONTIN Take 300 mg by mouth 2 (two) times daily. Take 2 pills twice a day   hydrALAZINE 25 MG tablet Commonly known as:  APRESOLINE Take 25 mg by mouth as needed.   meclizine 25 MG tablet Commonly  known as:  ANTIVERT Take 1 tablet (25 mg total) by mouth 3 (three) times daily as needed for dizziness.   metoprolol succinate 25 MG 24 hr tablet Commonly known as:  TOPROL-XL Take 1 tablet (25 mg total) by mouth daily.   mometasone-formoterol 100-5 MCG/ACT Aero Commonly known as:  DULERA Inhale 2 puffs into the lungs 2 (two) times daily.   nitroGLYCERIN 0.4 MG SL tablet Commonly known as:  Nitrostat Place 1 tablet (0.4 mg total) under the tongue every 5 (five) minutes as needed for chest pain.   OXYGEN Inhale 2 L into the lungs daily as needed (oxygen).   ranolazine 1000 MG SR tablet Commonly known as:  RANEXA Take 500 mg BID for next week, then take 1000 mg BID.   roflumilast 500 MCG Tabs tablet Commonly known as:  DALIRESP Take 1 tablet (500 mcg total) by mouth daily. What changed:  additional instructions   tiotropium 18 MCG inhalation capsule Commonly known as:  SPIRIVA Place 1 capsule (18 mcg total) into inhaler and inhale daily. What changed:  additional instructions        DISCHARGE INSTRUCTIONS:    Follow with cardio clinic in 1-2 weeks.  If you experience worsening of your admission symptoms, develop shortness of breath, life threatening emergency, suicidal or homicidal thoughts you must seek medical attention immediately by calling 911 or calling your MD immediately  if symptoms less severe.  You Must read complete instructions/literature along with all the possible adverse reactions/side effects for all the Medicines you take and that have been prescribed to you. Take any new Medicines after you have completely understood and accept all the possible adverse reactions/side effects.   Please note  You were cared for by a hospitalist during your hospital stay. If you have any questions about your discharge medications or the care you received while you were in the hospital after you are discharged, you can call the unit and asked to speak with the hospitalist  on call if the hospitalist that took care of you is not available. Once you are discharged, your primary care physician will handle any further medical issues. Please note that NO REFILLS for any discharge medications will be authorized once you are discharged, as it is imperative that you return to your primary care physician (or establish a relationship with a primary care physician if you do not have one) for your aftercare needs so that they can reassess your need for medications and monitor your lab values.    Today   CHIEF COMPLAINT:   Chief Complaint  Patient presents with  . Chest Pain    HISTORY OF PRESENT ILLNESS:  Brallan Denio  is a 68 y.o. male with a known history of CAD presents with chest pain.  He states he woke up before daylight.  He had a dull pressure in his chest 10 out of 10 in intensity.  It was constant but sort of comes and goes a little bit also dipping down to a 5 out of 10 intensity.  It radiates up into his neck.  He also stated he had some shortness of breath and some weakness.  No nausea or sweating.  He took 3 nitroglycerin at home and then called EMS.  His blood pressure was high when EMS came.  He was given some more nitroglycerin.  His chest pain went away after he was given morphine and nitroglycerin patch.  Hospitalist services contacted for further evaluation.  First troponin is negative.   VITAL SIGNS:  Blood pressure 120/75, pulse (!) 101, temperature 98.9 F (37.2 C), temperature source Oral, resp. rate 19, height 5\' 9"  (1.753 m), weight 93 kg, SpO2 99 %.  I/O:    Intake/Output Summary (Last 24 hours) at 09/07/2018 0943 Last data filed at 09/07/2018 0356 Gross per 24 hour  Intake 287.38 ml  Output 400 ml  Net -112.62 ml    PHYSICAL EXAMINATION:  GENERAL:  68 y.o.-year-old patient lying in the bed with no acute distress.  EYES: Pupils equal, round, reactive to light and accommodation. No scleral icterus. Extraocular muscles intact.  HEENT: Head  atraumatic, normocephalic. Oropharynx and nasopharynx clear.  NECK:  Supple, no jugular venous distention. No thyroid enlargement, no tenderness.  LUNGS: Normal breath sounds bilaterally, no wheezing, rales,rhonchi or crepitation. No use of accessory muscles of respiration.  CARDIOVASCULAR: S1, S2 normal. No murmurs, rubs, or gallops.  ABDOMEN: Soft, non-tender, non-distended. Bowel sounds present. No organomegaly or mass.  EXTREMITIES: No pedal  edema, cyanosis, or clubbing.  NEUROLOGIC: Cranial nerves II through XII are intact. Muscle strength 5/5 in all extremities. Sensation intact. Gait not checked.  PSYCHIATRIC: The patient is alert and oriented x 3.  SKIN: No obvious rash, lesion, or ulcer.   DATA REVIEW:   CBC Recent Labs  Lab 09/07/18 0347  WBC 9.7  HGB 12.0*  HCT 37.3*  PLT 254    Chemistries  Recent Labs  Lab 09/06/18 1004 09/07/18 0347  NA 138 137  K 3.2* 3.5  CL 99 102  CO2 29 27  GLUCOSE 103* 97  BUN 14 15  CREATININE 1.61* 1.68*  CALCIUM 8.5* 8.5*  MG 1.8  --     Cardiac Enzymes Recent Labs  Lab 09/06/18 1803  TROPONINI 0.04*    Microbiology Results  No results found for this or any previous visit.  RADIOLOGY:  Dg Chest Port 1 View  Result Date: 09/06/2018 CLINICAL DATA:  Chest pain EXAM: PORTABLE CHEST 1 VIEW COMPARISON:  06/08/2018 FINDINGS: Heart is normal size. No confluent airspace opacities or effusions. No acute bony abnormality. IMPRESSION: No active disease. Electronically Signed   By: Rolm Baptise M.D.   On: 09/06/2018 10:33    EKG:   Orders placed or performed during the hospital encounter of 09/06/18  . EKG 12-Lead  . EKG 12-Lead  . EKG 12-Lead  . EKG 12-Lead  . ED EKG  . ED EKG      Management plans discussed with the patient, family and they are in agreement.  CODE STATUS:     Code Status Orders  (From admission, onward)         Start     Ordered   09/06/18 1554  Full code  Continuous     09/06/18 1553         Code Status History    Date Active Date Inactive Code Status Order ID Comments User Context   09/06/2018 1125 09/06/2018 1553 Full Code [id number]  Loletha Grayer, MD ED   09/30/2017 0051 10/01/2017 1813 Full Code [id number]  Vianne Bulls, MD ED   07/27/2016 0319 07/27/2016 1836 Full Code [id number]  Saundra Shelling, MD ED   09/28/2015 0119 09/29/2015 2026 Full Code [id number]  Lance Coon, MD Inpatient   03/12/2015 0916 03/12/2015 1441 Full Code [id number]  Leonie Man, MD Inpatient   03/01/2015 1510 03/02/2015 1359 Full Code [id number]  Aldean Jewett, MD Inpatient   11/23/2014 0024 11/23/2014 1413 Full Code [id number]  Lavina Hamman, MD ED   06/08/2014 2050 06/09/2014 1813 Full Code [id number]  Ivor Costa, MD Inpatient   12/12/2013 1324 12/14/2013 1702 Full Code [id number]  Wellington Hampshire, MD Inpatient   12/11/2013 1508 12/12/2013 1324 Full Code [id number]  Drucilla Schmidt, MD Inpatient      TOTAL TIME TAKING CARE OF THIS PATIENT: 35 minutes.    Vaughan Basta M.D on 09/07/2018 at 9:43 AM  Between 7am to 6pm - Pager - [phone]  After 6pm go to [email or web address] - password [email or web address] [facility]  Office  [phone]  CC: Primary care physician; Philmore Pali, NP   Note: This dictation was prepared with Dragon dictation along with smaller phrase technology. Any transcriptional errors that result from this process are unintentional.

## 2018-09-07 NOTE — Progress Notes (Signed)
Progress Note  Patient Name: William Mueller Date of Encounter: 09/07/2018  Primary Cardiologist: Ida Rogue, MD   Subjective   Cardiac catheterization yesterday, well tolerated with no complications No significant right groin pain Results discussed with him in detail Significant disease of small distal left circumflex, significant disease of small diagonal branch Disease detailed above possibly causing his unstable angina symptoms  Inpatient Medications    Scheduled Meds: . ALPRAZolam  3 mg Oral QHS  . ALPRAZolam  1 mg Oral Daily  . aspirin EC  81 mg Oral Daily  . atorvastatin  80 mg Oral Daily  . ezetimibe  10 mg Oral Daily  . gabapentin  300 mg Oral BID  . metoprolol succinate  25 mg Oral Daily  . mometasone-formoterol  2 puff Inhalation BID  . nitroGLYCERIN  0.5 inch Topical Q6H  . ranolazine  500 mg Oral BID  . roflumilast  500 mcg Oral Q1200  . sodium chloride flush  3 mL Intravenous Q12H  . tiotropium  18 mcg Inhalation Daily   Continuous Infusions: . sodium chloride     PRN Meds: sodium chloride, acetaminophen **OR** acetaminophen, acetaminophen, albuterol, hydrALAZINE, meclizine, nitroGLYCERIN, ondansetron **OR** ondansetron (ZOFRAN) IV, ondansetron (ZOFRAN) IV, sodium chloride flush   Vital Signs    Vitals:   09/07/18 0035 09/07/18 0355 09/07/18 0611 09/07/18 0723  BP:  (!) 142/83  120/75  Pulse:  (!) 113 (!) 102 (!) 101  Resp:  16  19  Temp:  99.2 F (37.3 C)  98.9 F (37.2 C)  TempSrc:  Oral  Oral  SpO2: 94% 97%  99%  Weight:  93 kg    Height:        Intake/Output Summary (Last 24 hours) at 09/07/2018 1008 Last data filed at 09/07/2018 1001 Gross per 24 hour  Intake 290.38 ml  Output 400 ml  Net -109.62 ml   Last 3 Weights 09/07/2018 09/06/2018 09/06/2018  Weight (lbs) 205 lb 203 lb 3.2 oz 203 lb 3.2 oz  Weight (kg) 92.987 kg 92.171 kg 92.171 kg      Telemetry    Normal sinus rhythm- Personally Reviewed  ECG     - Personally Reviewed   Physical Exam   GEN: No acute distress.   Neck: No JVD Cardiac: RRR, no murmurs, rubs, or gallops.  Respiratory: Clear to auscultation bilaterally.  Scattered rhonchi GI: Soft, nontender, non-distended  MS: No edema; No deformity. Neuro:  Nonfocal  Psych: Normal affect   Labs    Chemistry Recent Labs  Lab 09/06/18 1004 09/07/18 0347  NA 138 137  K 3.2* 3.5  CL 99 102  CO2 29 27  GLUCOSE 103* 97  BUN 14 15  CREATININE 1.61* 1.68*  CALCIUM 8.5* 8.5*  GFRNONAA 43* 41*  GFRAA 50* 47*  ANIONGAP 10 8     Hematology Recent Labs  Lab 09/06/18 1004 09/07/18 0347  WBC 9.9 9.7  RBC 3.73* 4.34  HGB 10.5* 12.0*  HCT 32.0* 37.3*  MCV 85.8 85.9  MCH 28.2 27.6  MCHC 32.8 32.2  RDW 12.7 12.7  PLT 207 254    Cardiac Enzymes Recent Labs  Lab 09/06/18 1004 09/06/18 1245 09/06/18 1803  TROPONINI <0.03 <0.03 0.04*   No results for input(s): TROPIPOC in the last 168 hours.   BNPNo results for input(s): BNP, PROBNP in the last 168 hours.   DDimer No results for input(s): DDIMER in the last 168 hours.   Radiology    Dg Chest [facility]  1 View  Result Date: 09/06/2018 CLINICAL DATA:  Chest pain EXAM: PORTABLE CHEST 1 VIEW COMPARISON:  06/08/2018 FINDINGS: Heart is normal size. No confluent airspace opacities or effusions. No acute bony abnormality. IMPRESSION: No active disease. Electronically Signed   By: Rolm Baptise M.D.   On: 09/06/2018 10:33    Cardiac Studies   Cardiac catheterization performed yesterday  Mid Cx to Dist Cx lesion is 90% stenosed.  Ost 1st Diag to 1st Diag lesion is 80% stenosed.  Previously placed Prox RCA to Mid RCA stent (unknown type) is widely patent.  Prox RCA lesion is 35% stenosed.  The left ventricular ejection fraction is 55-65% by visual estimate.  The left ventricular systolic function is normal.  LV end diastolic pressure is normal.  There is no mitral valve regurgitation.  Prox Cx lesion is 30% stenosed.  Patient Profile      68 year old gentleman with history of coronary artery disease stent to his RCA 1610, chronic diastolic CHF, long smoking history with COPD, presenting to the emergency room with unstable angina symptoms 10/10, ST depressions anterolateral leads concerning for ischemia  Assessment & Plan    A/P: Unstable angina Cardiac catheterization yesterday for unstable angina symptoms showing severe disease of small diagonal branch and distal left circumflex Stent in the RCA patent -Suspect disease detailed above could be contributing to his symptoms Long discussion with him concerning anatomy, Anatomy not amenable to stenting small vessel size, Case discussed with interventional cardiology --Continue low-dose beta-blocker, isosorbide, statin, aspirin --Other medication options include Ranexa This has been started 500 twice daily Suggested to him he increase the dose up to 1000 twice daily after 1 week --In follow-up will suggest cardiac rehab after virus outbreak has resolved  2.  Chronic renal failure Creatinine 1.6 yesterday, hydration last night Renal function little changed on today's labs  3.  History of atrial tachycardia Relatively well controlled on beta-blocker, continue current dose of metoprolol Will avoid higher doses of beta-blocker given severe COPD  4.  Essential hypertension Continue current medications 960 systolic this morning  5.  COPD Long smoking history Uses home oxygen as needed particularly with walking May benefit from oxygen with sleeping  Long discussion with him concerning his unstable angina, various treatment options Discussed anti-anginal medications Discussed the benefits of cardiac rehab at a later date.  He is very sedentary Discharge instructions provided concerning post cath recovery  total encounter time more than 35 minutes  Greater than 50% was spent in counseling and coordination of care with the patient   For questions or updates, please  contact [facility] Please consult [email or web address] for contact info under        Signed, Ida Rogue, MD  09/07/2018, 10:08 AM

## 2018-09-08 ENCOUNTER — Telehealth: Payer: Self-pay | Admitting: Cardiovascular Disease

## 2018-09-08 DIAGNOSIS — G4730 Sleep apnea, unspecified: Secondary | ICD-10-CM | POA: Diagnosis not present

## 2018-09-08 DIAGNOSIS — Z955 Presence of coronary angioplasty implant and graft: Secondary | ICD-10-CM | POA: Diagnosis not present

## 2018-09-08 DIAGNOSIS — F419 Anxiety disorder, unspecified: Secondary | ICD-10-CM | POA: Diagnosis not present

## 2018-09-08 DIAGNOSIS — N183 Chronic kidney disease, stage 3 (moderate): Secondary | ICD-10-CM | POA: Diagnosis not present

## 2018-09-08 DIAGNOSIS — F17290 Nicotine dependence, other tobacco product, uncomplicated: Secondary | ICD-10-CM | POA: Diagnosis not present

## 2018-09-08 DIAGNOSIS — C61 Malignant neoplasm of prostate: Secondary | ICD-10-CM | POA: Diagnosis not present

## 2018-09-08 DIAGNOSIS — J439 Emphysema, unspecified: Secondary | ICD-10-CM | POA: Diagnosis not present

## 2018-09-08 DIAGNOSIS — Z9181 History of falling: Secondary | ICD-10-CM | POA: Diagnosis not present

## 2018-09-08 DIAGNOSIS — F329 Major depressive disorder, single episode, unspecified: Secondary | ICD-10-CM | POA: Diagnosis not present

## 2018-09-08 DIAGNOSIS — I25110 Atherosclerotic heart disease of native coronary artery with unstable angina pectoris: Secondary | ICD-10-CM | POA: Diagnosis not present

## 2018-09-08 DIAGNOSIS — I5032 Chronic diastolic (congestive) heart failure: Secondary | ICD-10-CM | POA: Diagnosis not present

## 2018-09-08 DIAGNOSIS — Z7982 Long term (current) use of aspirin: Secondary | ICD-10-CM | POA: Diagnosis not present

## 2018-09-08 DIAGNOSIS — Z48812 Encounter for surgical aftercare following surgery on the circulatory system: Secondary | ICD-10-CM | POA: Diagnosis not present

## 2018-09-08 DIAGNOSIS — Z9981 Dependence on supplemental oxygen: Secondary | ICD-10-CM | POA: Diagnosis not present

## 2018-09-08 DIAGNOSIS — I130 Hypertensive heart and chronic kidney disease with heart failure and stage 1 through stage 4 chronic kidney disease, or unspecified chronic kidney disease: Secondary | ICD-10-CM | POA: Diagnosis not present

## 2018-09-08 DIAGNOSIS — E785 Hyperlipidemia, unspecified: Secondary | ICD-10-CM | POA: Diagnosis not present

## 2018-09-08 NOTE — Telephone Encounter (Signed)
Spoke with the pt wife. Confirmed that after the pt 09/07/18 hospital d/c asa 81mg  daily is to be continued.

## 2018-09-08 NOTE — Telephone Encounter (Signed)
Please call to discuss medications when she was D/C from the hospital

## 2018-09-11 ENCOUNTER — Other Ambulatory Visit: Payer: Self-pay

## 2018-09-11 ENCOUNTER — Other Ambulatory Visit: Payer: Self-pay | Admitting: Pharmacy Technician

## 2018-09-11 NOTE — Patient Outreach (Signed)
[facility]) Care Management  09/11/2018  William Mueller 1950-06-10 [id number]    Follow up call placed to [employer] regarding patient assistance application(s) for [facility], Proventil HFA and Zetia , Di Kindle confirms patient has been approved for [facility] and Proventl HFA as of 3/30 and Zetia 3/25 until 06/07/2019. Zetia to be delivered to patients home on 4/7 and Dulera and Proventil to be delivered 4/9.  Follow up:  Will follow up with patients wife in 5-7 business days to confirm medications have been received.  [name] [place] Certified Pharmacy Technician [place] Management Direct [phone]

## 2018-09-11 NOTE — Patient Outreach (Signed)
[facility]) Care Management  09/11/2018  William Mueller [birth year] [id number]   BSW received communication from [name] with [facility] on Wheels regarding status of referral.  Per Janett Billow, they are not adding any new deliveries to their routes at this time.  Per Janett Billow, "We are trying to figure out a way to get Mr. Moes on a route. The [facility] Director has rearranged a current route to try to accommodate adding him on once we reopen. Once she has confirmed that he could go on the route I will reach back out to you."   BSW spoke with patient's spouse and provided her with above update.  BSW reminded her that an assessment will have to occur to determine patient's eligibility for program.  BSW will follow up once another update is received from [place] with [facility].   [name], BSW Social Worker [phone]

## 2018-09-12 DIAGNOSIS — F419 Anxiety disorder, unspecified: Secondary | ICD-10-CM | POA: Diagnosis not present

## 2018-09-12 DIAGNOSIS — I25119 Atherosclerotic heart disease of native coronary artery with unspecified angina pectoris: Secondary | ICD-10-CM | POA: Diagnosis not present

## 2018-09-12 DIAGNOSIS — I10 Essential (primary) hypertension: Secondary | ICD-10-CM | POA: Diagnosis not present

## 2018-09-12 DIAGNOSIS — Z6831 Body mass index (BMI) 31.0-31.9, adult: Secondary | ICD-10-CM | POA: Diagnosis not present

## 2018-09-12 DIAGNOSIS — F17200 Nicotine dependence, unspecified, uncomplicated: Secondary | ICD-10-CM | POA: Diagnosis not present

## 2018-09-12 DIAGNOSIS — J449 Chronic obstructive pulmonary disease, unspecified: Secondary | ICD-10-CM | POA: Diagnosis not present

## 2018-09-13 ENCOUNTER — Other Ambulatory Visit: Payer: Self-pay

## 2018-09-13 ENCOUNTER — Telehealth: Payer: Self-pay

## 2018-09-13 NOTE — Telephone Encounter (Signed)
Made patient aware that I would give them a call tomorrow morning to do a trial doxy so the patient knows exactly what to look for at the time of the appointment.    Virtual Visit Pre-Appointment Phone Call  Steps For Call:  1. Confirm consent - "In the setting of the current Covid19 crisis, you are scheduled for a VIDEO visit with your provider on 09/14/2018 at 3:00PM.  Just as we do with many in-office visits, in order for you to participate in this visit, we must obtain consent.  If you'd like, I can send this to your mychart (if signed up) or email for you to review.  Otherwise, I can obtain your verbal consent now.  All virtual visits are billed to your insurance company just like a normal visit would be.  By agreeing to a virtual visit, we'd like you to understand that the technology does not allow for your provider to perform an examination, and thus may limit your provider's ability to fully assess your condition.  Finally, though the technology is pretty good, we cannot assure that it will always work on either your or our end, and in the setting of a video visit, we may have to convert it to a phone-only visit.  In either situation, we cannot ensure that we have a secure connection.  Are you willing to proceed?"  2. Give patient instructions for WebEx download to smartphone as below if video visit  3. Advise patient to be prepared with any vital sign or heart rhythm information, their current medicines, and a piece of paper and pen handy for any instructions they may receive the day of their visit  4. Inform patient they will receive a phone call 15 minutes prior to their appointment time (may be from unknown caller ID) so they should be prepared to answer  5. Confirm that appointment type is correct in Epic appointment notes (video vs telephone)    TELEPHONE CALL NOTE  William Mueller has been deemed a candidate for a follow-up tele-health visit to limit community exposure during the  Covid-19 pandemic. I spoke with the patient via phone to ensure availability of phone/video source, confirm preferred email & phone number, and discuss instructions and expectations.  I reminded HUDSYN CHAMPINE to be prepared with any vital sign and/or heart rhythm information that could potentially be obtained via home monitoring, at the time of his visit. I reminded DANA DORNER to expect a phone call at the time of his visit if his visit.  Did the patient verbally acknowledge consent to treatment? yes  William Mueller, [place] 09/13/2018 11:37 AM  CONSENT FOR TELE-HEALTH VISIT - PLEASE REVIEW  I hereby voluntarily request, consent and authorize [facility] and its employed or contracted physicians, physician assistants, nurse practitioners or other licensed health care professionals (the Practitioner), to provide me with telemedicine health care services (the "Services") as deemed necessary by the treating Practitioner. I acknowledge and consent to receive the Services by the Practitioner via telemedicine. I understand that the telemedicine visit will involve communicating with the Practitioner through live audiovisual communication technology and the disclosure of certain medical information by electronic transmission. I acknowledge that I have been given the opportunity to request an in-person assessment or other available alternative prior to the telemedicine visit and am voluntarily participating in the telemedicine visit.  I understand that I have the right to withhold or withdraw my consent to the use of telemedicine in the course of  my care at any time, without affecting my right to future care or treatment, and that the Practitioner or I may terminate the telemedicine visit at any time. I understand that I have the right to inspect all information obtained and/or recorded in the course of the telemedicine visit and may receive copies of available information for a reasonable fee.  I  understand that some of the potential risks of receiving the Services via telemedicine include:  Marland Kitchen Delay or interruption in medical evaluation due to technological equipment failure or disruption; . Information transmitted may not be sufficient (e.g. poor resolution of images) to allow for appropriate medical decision making by the Practitioner; and/or  . In rare instances, security protocols could fail, causing a breach of personal health information.  Furthermore, I acknowledge that it is my responsibility to provide information about my medical history, conditions and care that is complete and accurate to the best of my ability. I acknowledge that Practitioner's advice, recommendations, and/or decision may be based on factors not within their control, such as incomplete or inaccurate data provided by me or distortions of diagnostic images or specimens that may result from electronic transmissions. I understand that the practice of medicine is not an exact science and that Practitioner makes no warranties or guarantees regarding treatment outcomes. I acknowledge that I will receive a copy of this consent concurrently upon execution via email to the email address I last provided but may also request a printed copy by calling the office of [place].    I understand that my insurance will be billed for this visit.   I have read or had this consent read to me. . I understand the contents of this consent, which adequately explains the benefits and risks of the Services being provided via telemedicine.  . I have been provided ample opportunity to ask questions regarding this consent and the Services and have had my questions answered to my satisfaction. . I give my informed consent for the services to be provided through the use of telemedicine in my medical care  By participating in this telemedicine visit I agree to the above.

## 2018-09-13 NOTE — Patient Outreach (Signed)
[facility]) Care Management  09/13/2018  William Mueller [birth year] [id number]    BSW received the following communication from [name] with Meals [employer] Wheels program: "Unfortunately, Mr. Kerstein lives too far out from the senior center. The only way that we can serve meals to him once we reopen would be through frozen pick-up at the [facility]."  If patient agrees to this, he can be assessed once center reopens.  Successful outreach to patient's spouse to inform her of above communication. Spouse reports that she could pick up meals and would like for him to be assessed for eligibility.  Patient is unable to prepare meals for self when spouse is at work.  Spouse is currently not working for the next month due to [place] and patient being high risk.  BSW talked with spouse about options if patient does not meat criteria for MOW.  BSW suggested preparing meals ahead of time so that patient just has to warm them up. BSW followed up with Janett Billow and asked that patient be assessed when facility reopens.   BSW closing case at this time.  [name], BSW Social Worker [phone]

## 2018-09-14 ENCOUNTER — Telehealth (INDEPENDENT_AMBULATORY_CARE_PROVIDER_SITE_OTHER): Payer: PPO | Admitting: Cardiovascular Disease

## 2018-09-14 ENCOUNTER — Telehealth: Payer: Self-pay

## 2018-09-14 ENCOUNTER — Other Ambulatory Visit: Payer: Self-pay

## 2018-09-14 DIAGNOSIS — I4719 Other supraventricular tachycardia: Secondary | ICD-10-CM

## 2018-09-14 DIAGNOSIS — I129 Hypertensive chronic kidney disease with stage 1 through stage 4 chronic kidney disease, or unspecified chronic kidney disease: Secondary | ICD-10-CM

## 2018-09-14 DIAGNOSIS — N183 Chronic kidney disease, stage 3 unspecified: Secondary | ICD-10-CM

## 2018-09-14 DIAGNOSIS — I10 Essential (primary) hypertension: Secondary | ICD-10-CM

## 2018-09-14 DIAGNOSIS — J432 Centrilobular emphysema: Secondary | ICD-10-CM

## 2018-09-14 DIAGNOSIS — I471 Supraventricular tachycardia: Secondary | ICD-10-CM

## 2018-09-14 DIAGNOSIS — I25110 Atherosclerotic heart disease of native coronary artery with unstable angina pectoris: Secondary | ICD-10-CM

## 2018-09-14 MED ORDER — RANOLAZINE ER 1000 MG PO TB12: ORAL_TABLET | ORAL | 1 refills | Status: DC

## 2018-09-14 NOTE — Patient Instructions (Signed)
Medication Instructions:  Refill ranexa 1000 mg twice a day, 30 days with refill Do not fill until patient calls  If you need a refill on your cardiac medications before your next appointment, please call your pharmacy.   Lab work: No new labs needed   If you have labs (blood work) drawn today and your tests are completely normal, you will receive your results only by: Marland Kitchen MyChart Message (if you have MyChart) OR . A paper copy in the mail If you have any lab test that is abnormal or we need to change your treatment, we will call you to review the results.   Testing/Procedures: No new testing needed   Follow-Up: At [facility], you and your health needs are our priority.  As part of our continuing mission to provide you with exceptional heart care, we have created designated Provider Care Teams.  These Care Teams include your primary Cardiologist (physician) and Advanced Practice Providers (APPs -  Physician Assistants and Nurse Practitioners) who all work together to provide you with the care you need, when you need it.  . You will need a follow up appointment in 6 months .   Please call our office 2 months in advance to schedule this appointment.    . Providers on your designated Care Team:   . Murray Hodgkins, NP . Christell Faith, PA-C . Marrianne Mood, PA-C  Any Other Special Instructions Will Be Listed Below (If Applicable).  For educational health videos Log in to : [email or web address] Or : [email or web address], password : [email or web address]

## 2018-09-14 NOTE — Progress Notes (Addendum)
Virtual Visit via Video Note   This visit type was conducted due to national recommendations for restrictions regarding the COVID-19 Pandemic (e.g. social distancing) in an effort to limit this patient's exposure and mitigate transmission in our community.  Due to her co-morbid illnesses, this patient is at least at moderate risk for complications without adequate follow up.  This format is felt to be most appropriate for this patient at this time.  All issues noted in this document were discussed and addressed.  A limited physical exam was performed with this format.  Please refer to the patient's chart for her consent to telehealth for [facility].    Date:  09/14/2018   ID:  William Mueller, DOB Feb 05, 1951, MRN [id number]  Patient Location:  [place] [address]   Provider location:   [facility], [place] office  PCP:  Philmore Pali, NP  Cardiologist:  Patsy Baltimore  Chief Complaint: Shortness of breath Seen on WebCam   History of Present Illness:    William Mueller is a 68 y.o. male who presents via audio/video conferencing for a telehealth visit today.   The patient does not symptoms concerning for COVID-19 infection (fever, chills, cough, or new SHORTNESS OF BREATH).   Patient has a past medical history of long history of smoking, COPD,  Uses ecig chronic renal insufficiency, CR 1.7 CAD  12/11/2013 to [place] with chest pain cardiac catheterization lab : occluded mid RCA. Stent was placed.  Repeat catheterization in October 2016 with patent stent,disease of a small diagonal and distal circumflex not amenable to intervention.  paroxysmal atrial tachycardia Cardiac catheterization April 2017 He presents today for follow-up of his coronary artery disease, stable angina and COPD   For worsening anginal symptoms he underwent cardiac catheterization 09/06/2018 Results showed grossly patent vessels with Significant disease of a small  diagonal branch and small distal left circumflex branches  other major branches of the RCA, left circumflex, LAD and large diagonal are intact with no significant disease   Mid Cx to Dist Cx lesion is 90% stenosed.  Ost 1st Diag to 1st Diag lesion is 80% stenosed.  Previously placed Prox RCA to Mid RCA stent (unknown type) is widely patent.  Prox RCA lesion is 35% stenosed.  The left ventricular ejection fraction is 55-65% by visual estimate.  The left ventricular systolic function is normal.  LV end diastolic pressure is normal.  There is no mitral valve regurgitation.  Prox Cx lesion is 30% stenosed.    Recommendations: Medical management start Ranexa 500 mg twice daily for 1 week then up to 1000 mg twice daily It was felt his chest pain was secondary to small vessel disease  In follow-up today reports that he Feels great, No chest pain when waking in the Am First time this has happened in a long time Overall he is very happy. "  Catheterization fix me" We reminded him that he had started Ranexa 500 mg twice daily  Reports that he sits on the couch most of the day, no activity No regular exercise program given arthritic issues as above  Chronic shortness of breath, on home oxygen   Vitals on today's visit Blood pressure 138/90 Pulse:  70-80 resp 16   Lab work reviewed   total cholesterol 125 LDL 59 creatinine 1.73 normal electrolytes and LFTs   Prior CV studies:   The following studies were reviewed today:    Past Medical History:  Diagnosis Date  . Anginal pain ([place]) 06/2017  . Anxiety   . Bronchitis 06/2017  . Chronic chest pain   . Chronic kidney disease (CKD) stage G3b/A1, moderately decreased glomerular filtration rate (GFR) between 30-44 mL/min/1.73 square meter and albuminuria creatinine ratio less than 30 mg/g (HCC)   . Chronic lower back pain    WITH LEG WEAKNESS  . COPD (chronic obstructive pulmonary disease) (HCC)    EMPHYSEMA, O2 AT 2L PRN   . Coronary artery disease    a. 12/2013 PCI: mRCA 100% with L to R collats s/p PCI/DES;  b. 06/2014 MV: no ischemia/infarct, EF 53%;  c. 03/2015 Cath: LM nl, LAD nl, D1 80 (1.52mm), LCX 11m (<1.63mm), OM1 nl, RCA 55p, patent stent, RPDA nl, EF 65%; d. 09/2015 Cath: LM nl, LAD 21m, D1 80 (<67mm), LCX 41m/d (<1.61mm), OM1 nl, RCA 55p (FFR 0.93), patent stent, RPDA nl-->Med Rx.  . Daily headache   . Depression   . Diastolic dysfunction    a. echo 12/2013: EF 55-60%, mild LVH, GR1DD, inf HK, elevated CVP, mildly dilated IVC suggestive of increased RA pressure  . Dyspnea    WHEEZING  . GERD (gastroesophageal reflux disease)    REFLUX  . Hearing loss   . High cholesterol   . Hypertension   . Myocardial infarction ([place]) 2015  . Nicotine addiction    a. using eCigs.  . Orthopnea   . Sleep apnea    Past Surgical History:  Procedure Laterality Date  . CARDIAC CATHETERIZATION     MC x 1 stent  . CARDIAC CATHETERIZATION Left 03/12/2015   Procedure: Left Heart Cath and Coronary Angiography;  Surgeon: Leonie Man, MD;  Location: [place] CV LAB;  Service: Cardiovascular;  Laterality: Left;  . CARDIAC CATHETERIZATION N/A 09/29/2015   Procedure: Left Heart Cath and Coronary Angiography;  Surgeon: Wellington Hampshire, MD;  Location: [place] CV LAB;  Service: Cardiovascular;  Laterality: N/A;  . CATARACT EXTRACTION W/PHACO Right 08/10/2017   Procedure: CATARACT EXTRACTION PHACO AND INTRAOCULAR LENS PLACEMENT ([place]) RIGHT;  Surgeon: Leandrew Koyanagi, MD;  Location: [facility];  Service: Ophthalmology;  Laterality: Right;  . CORONARY ANGIOPLASTY     STENT PLACEMENT  . LEFT HEART CATH AND CORONARY ANGIOGRAPHY N/A 09/06/2018   Procedure: LEFT HEART CATH AND CORONARY ANGIOGRAPHY;  Surgeon: Minna Merritts, MD;  Location: [place] [facility] CV [facility];  Service: Cardiovascular;  Laterality: N/A;  . LEFT HEART CATHETERIZATION WITH CORONARY ANGIOGRAM N/A 12/12/2013   Procedure: LEFT HEART CATHETERIZATION  WITH CORONARY ANGIOGRAM;  Surgeon: Wellington Hampshire, MD;  Location: [facility] CATH LAB;  Service: Cardiovascular;  Laterality: N/A;     No outpatient medications have been marked as taking for the 09/14/18 encounter (Appointment) with Minna Merritts, MD.     Allergies:   Paroxetine hcl; Serotonin reuptake inhibitors (ssris); Diazepam; Doxycycline monohydrate; Escitalopram oxalate; Lorazepam; and Tetracyclines & related   Social History   Tobacco Use  . Smoking status: Former Smoker    Years: 48.00    Types: E-cigarettes    Last attempt to quit: 06/07/2009    Years since quitting: 9.2  . Smokeless tobacco: Never Used  . Tobacco comment: Using electronic cigarette  Substance Use Topics  . Alcohol use: Yes    Alcohol/week: 6.0 standard drinks    Types: 6 Cans of beer per week    Comment: Occasional   . Drug use: No     Current Outpatient Medications on File Prior to Visit  Medication Sig Dispense Refill  . acetaminophen (TYLENOL) 500 MG tablet Take 1,000 mg by mouth daily as needed for headache.     . albuterol (PROVENTIL HFA;VENTOLIN HFA) 108 (90 Base) MCG/ACT inhaler Inhale into the lungs every 6 (six) hours as needed for wheezing or shortness of breath.    Marland Kitchen albuterol (PROVENTIL) (2.5 MG/3ML) 0.083% nebulizer solution Take 3 mLs (2.5 mg total) by nebulization every 6 (six) hours as needed for wheezing or shortness of breath. 30 vial 0  . ALPRAZolam (XANAX XR) 3 MG 24 hr tablet Take 3 mg by mouth 2 (two) times daily. 1 MG IN THE MORNING AND 3 MG IN THE EVENING    . ALPRAZolam (XANAX) 1 MG tablet Take 1 mg by mouth daily.   5  . aspirin 81 MG EC tablet Take 1 tablet (81 mg total) by mouth daily. (Patient taking differently: Take 81 mg by mouth daily. BREAKFAST) 90 tablet 3  . atorvastatin (LIPITOR) 80 MG tablet TAKE 1 TABLET BY MOUTH DAILY 90 tablet 3  . ezetimibe (ZETIA) 10 MG tablet Take 1 tablet (10 mg total) by mouth daily. 90 tablet 0  . furosemide (LASIX) 20 MG tablet Take 20 mg by  mouth daily.     Marland Kitchen gabapentin (NEURONTIN) 100 MG capsule Take 300 mg by mouth 2 (two) times daily. Take 2 pills twice a day     . hydrALAZINE (APRESOLINE) 25 MG tablet Take 25 mg by mouth as needed.    . meclizine (ANTIVERT) 25 MG tablet Take 1 tablet (25 mg total) by mouth 3 (three) times daily as needed for dizziness. 30 tablet 0  . metoprolol succinate (TOPROL-XL) 25 MG 24 hr tablet Take 1 tablet (25 mg total) by mouth daily. 90 tablet 3  . mometasone-formoterol (DULERA) 100-5 MCG/ACT AERO Inhale 2 puffs into the lungs 2 (two) times daily.     . nitroGLYCERIN (NITROSTAT) 0.4 MG SL tablet Place 1 tablet (0.4 mg total) under the tongue every 5 (five) minutes as needed for chest pain. 25 tablet 1  . OXYGEN Inhale 2 L into the lungs daily as needed (oxygen).     . ranolazine (RANEXA) 1000 MG SR tablet Take 500 mg BID for next week, then take 1000 mg BID. 60 tablet 0  . roflumilast (DALIRESP) 500 MCG TABS tablet Take 1 tablet (500 mcg total) by mouth daily. (Patient taking differently: Take 500 mcg by mouth daily. LUNCH) 90 tablet 1  . tiotropium (SPIRIVA) 18 MCG inhalation capsule Place 1 capsule (18 mcg total) into inhaler and inhale daily. (Patient taking differently: Place 18 mcg into inhaler and inhale daily. AM) 90 capsule 1   No current facility-administered medications on file prior to visit.      Family Hx: The patient's family history includes CVA in his mother; Coronary artery disease in his brother and father; Heart disease in his father; Heart disease (age of onset: 64) in his brother. There is no history of Kidney disease or Prostate cancer.  ROS:   Please see the history of present illness.    Review of Systems  Constitutional: Negative.   Respiratory: Positive for shortness of breath.   Cardiovascular: Negative.   Gastrointestinal: Negative.   Musculoskeletal: Negative.   Neurological: Negative.   Psychiatric/Behavioral: Negative.   All other systems reviewed and are  negative.     Labs/Other Tests and Data Reviewed:    Recent Labs: 05/15/2018: ALT 20 09/06/2018: Magnesium 1.8; TSH 1.212 09/07/2018: BUN 15; Creatinine, Ser 1.68;  Hemoglobin 12.0; Platelets 254; Potassium 3.5; Sodium 137   Recent Lipid Panel Lab Results  Component Value Date/Time   CHOL 98 09/07/2018 03:47 AM   CHOL 156 05/28/2015 08:18 AM   TRIG 75 09/07/2018 03:47 AM   HDL 31 (L) 09/07/2018 03:47 AM   HDL 54 05/28/2015 08:18 AM   CHOLHDL 3.2 09/07/2018 03:47 AM   LDLCALC 52 09/07/2018 03:47 AM   LDLCALC 73 05/28/2015 08:18 AM    Wt Readings from Last 3 Encounters:  09/07/18 205 lb (93 kg)  06/08/18 205 lb (93 kg)  05/15/18 208 lb (94.3 kg)     Exam:    Vital Signs: Vital signs may also be detailed in the HPI There were no vitals taken for this visit.  Wt Readings from Last 3 Encounters:  09/07/18 205 lb (93 kg)  06/08/18 205 lb (93 kg)  05/15/18 208 lb (94.3 kg)   Temp Readings from Last 3 Encounters:  09/07/18 98.9 F (37.2 C) (Oral)  06/08/18 98.2 F (36.8 C) (Oral)  05/15/18 98.8 F (37.1 C) (Oral)   BP Readings from Last 3 Encounters:  09/07/18 120/75  06/08/18 134/79  05/16/18 137/76   Pulse Readings from Last 3 Encounters:  09/07/18 (!) 101  06/08/18 92  05/16/18 65    Today's numbers Vitals on today's visit Blood pressure 138/90 Pulse:  70-80 resp 16  Well nourished, well developed male in no acute distress. Constitutional:  oriented to person, place, and time. No distress.  Head: Normocephalic and atraumatic.  Eyes:  no discharge. No scleral icterus.  Neck: Normal range of motion. Neck supple.  Pulmonary/Chest: No audible wheezing, no distress, appears comfortable Musculoskeletal: Normal range of motion.  no  tenderness or deformity.  Neurological:   Coordination normal. Full exam not performed Skin:  No rash Psychiatric:  normal mood and affect. behavior is normal. Thought content normal.    ASSESSMENT & PLAN:    Atherosclerosis of  native coronary artery of native heart with unstable angina pectoris (HCC) Currently with no symptoms of angina. No further workup at this time. Continue current medication regimen. Continue Ranexa 500 twice daily.  This seems to have dramatically improved his anginal symptoms in the morning Overall he is very happy with how he feels  Atrial tachycardia, paroxysmal (HCC) Denies having any breakthrough tachycardia No medication changes made  CKD (chronic kidney disease) stage 3, GFR 30-59 ml/min (HCC)  Centrilobular emphysema (HCC) On chronic oxygen, Stopped smoking  Labile essential hypertension Blood pressure is well controlled on today's visit. No changes made to the medications.     COVID-19 Education: The signs and symptoms of COVID-19 were discussed with the patient and how to seek care for testing (follow up with PCP or arrange E-visit).  The importance of social distancing was discussed today.  Patient Risk:   After full review of this patients clinical status, I feel that they are at least moderate risk at this time.  Time:   Today, I have spent 25 minutes with the patient with telehealth technology discussing the cardiac and medical problems/diagnoses detailed above  , discussed anginal symptoms, titration of his medications Discussed recent cardiac catheterization results   Medication Adjustments/Labs and Tests Ordered: Current medicines are reviewed at length with the patient today.  Concerns regarding medicines are outlined above.   Tests Ordered: No tests ordered   Medication Changes: No changes made   Disposition: Follow-up in 6 months   Signed, Ida Rogue, MD  09/14/2018 3:27 PM  [place] Office [address] [place], [place], [place] [address]

## 2018-09-14 NOTE — Telephone Encounter (Signed)
Spoke with patient.  I reviewed the steps on how the visit will go today.  We did a "doxy trial" to make sure video and audio works.  Both video and audio worked perfect for is both!  Made him aware that he will follow the same steps at the time of his appointment.  He was very appreciative for the walk though.

## 2018-09-18 DIAGNOSIS — R0609 Other forms of dyspnea: Secondary | ICD-10-CM | POA: Diagnosis not present

## 2018-09-18 DIAGNOSIS — J441 Chronic obstructive pulmonary disease with (acute) exacerbation: Secondary | ICD-10-CM | POA: Diagnosis not present

## 2018-09-18 DIAGNOSIS — Z9981 Dependence on supplemental oxygen: Secondary | ICD-10-CM | POA: Diagnosis not present

## 2018-09-19 ENCOUNTER — Ambulatory Visit: Payer: PPO | Admitting: *Deleted

## 2018-09-25 ENCOUNTER — Other Ambulatory Visit: Payer: Self-pay | Admitting: Pharmacy Technician

## 2018-09-25 ENCOUNTER — Other Ambulatory Visit: Payer: Self-pay | Admitting: Cardiovascular Disease

## 2018-09-25 NOTE — Patient Outreach (Addendum)
[facility]) Care Management  09/25/2018  William Mueller Jul 19, 1950 [id number]    Successful call placed to patients wife regarding patient assistance medication receipt from [place], HIPAA identifiers verified. William Mueller confirms patient has received 3 month supplys of Dulera, Proventil HFA and Zetia from [employer]. Reviewed with her how to obtain refills for medications and requested she contact me if they run into any issues. She stated she would.  Follow up:  Will route note to [facility] for case closure.  [name] [place] Certified Pharmacy Technician [place] Management Direct Dial:[phone]   Will close [facility] case as medication assistance successfully completed for Dulera, Proventil, and Zetia.  Am happy to assist in the future as needed.   Ralene Bathe, PharmD, [place] [phone]

## 2018-09-29 DIAGNOSIS — R531 Weakness: Secondary | ICD-10-CM | POA: Diagnosis not present

## 2018-09-29 DIAGNOSIS — Z6829 Body mass index (BMI) 29.0-29.9, adult: Secondary | ICD-10-CM | POA: Diagnosis not present

## 2018-10-04 ENCOUNTER — Telehealth: Payer: Self-pay | Admitting: Cardiovascular Disease

## 2018-10-04 NOTE — Telephone Encounter (Signed)
Spoke with patient's wife.  Since patient started Ranexa recently after his cath, he's been having trouble feeling like he is completely emptying his bladder.  He's also had episodes of jittering a "couple" times a week. For example, patient was getting up from chair on Friday and started shaking all over. It stopped after a few minutes. Patient stopped Ranexa on Friday, so it has been going on 5 days and he's not had the jitters and he is urinating better. No chest pain to report at this time.  Advised I will route to Dr Rockey Situ for further advice.

## 2018-10-04 NOTE — Telephone Encounter (Signed)
Pt c/o medication issue:  1. Name of Medication: Ranexa   2. How are you currently taking this medication (dosage and times per day)? Stopped taking   3. Are you having a reaction (difficulty breathing--STAT)? Jittery and urinary difficulties   4. What is your medication issue? Patient stopped taking x 4 days and has seen improvement with issues please call to discuss

## 2018-10-05 NOTE — Telephone Encounter (Signed)
Ok, he can hold the ranexa I think the dose was 500 BID he was taking? perhaps he could take it as needed for stuttering chest pain in the future

## 2018-10-06 DIAGNOSIS — J449 Chronic obstructive pulmonary disease, unspecified: Secondary | ICD-10-CM | POA: Diagnosis not present

## 2018-10-06 NOTE — Telephone Encounter (Signed)
Dr. Rockey Situ,  Can you take Ranexa PRN??

## 2018-10-12 MED ORDER — METOPROLOL SUCCINATE ER 25 MG PO TB24: 50.0000 mg | ORAL_TABLET | Freq: Every day | ORAL | Status: DC

## 2018-10-12 NOTE — Telephone Encounter (Signed)
I spoke with the patient's wife, the patient was in the background.  I advised I was following up on how he was doing since stopping Ranexa- no more issues with feeling "jittery" or problems urinating.  They have noticed that his HR's have been in the low 100's (112 bpm today)- feels regular to the patient. No chest pain until last night- he had 3-4 episodes intermittently that lasted a few minutes each.  I advised that Dr. Rockey Situ recommended that he could maybe try Ranexa PRN, but pharmacy advised there may not be much benefit in this due to the mechanism of the drug.   They state that patient's BP was up yesterday and he took a hydralazine 25 mg tablet- BP was 135/95. I inquired as to when he takes a hydralazine and he advised when the bottom number goes above 90.  His wife confirms he is currently taking metoprolol succinate 25 mg once daily in the AM. I advised I will review the above with Dr. Rockey Situ to see if he wants to try the patient on metoprolol succ 50 mg once daily.  I will call them back with further recommendations.  The patient's wife voices understanding and is agreeable.

## 2018-10-12 NOTE — Telephone Encounter (Addendum)
Secure chat message sent to Dr. Rockey Situ again today asking if Ranexa could be taken PRN.  He responded via secure chat- did not state if Ranexa, could be taken PRN, but that the patient "does what he wants."  Reviewed with Eino Farber, Pharm D via staff message- response received stating:   Fabio Pierce,   The time to peak concentration is about 2 hours (fairly quick), but based on the mechanism of the drug we don't really see any benefit until the patient reaches steady state (generally after about 4 doses) so PRN use likely would not provide much benefit.    Hope that helps!   Thanks,  Georgina Peer   Attempted to call the patient back. No answer- I left a message to please call back to follow up on how he is doing off Ranexa.

## 2018-10-12 NOTE — Telephone Encounter (Signed)
I spoke with the patient's wife. She is aware, that he may try increasing his metoprolol succinate to 25 mg- take 2 tablets (50 mg) once daily. She is also aware if chest pain persists, he can re-try Ranexa and see if side effects return.  I have asked that they monitor the patient's BP/ HR for several days on the higher dose metoprolol and call back with how he is tolerating this.   We can then send in an updated RX if needed.  The patient's wife voices understanding and is agreeable.

## 2018-10-12 NOTE — Telephone Encounter (Signed)
Patient recently stopped Ranexa because he said chest pain was better and did not needed If chest pain comes back he can go back on Ranexa on a regular basis  He has a long history of chronic stuttering chest pain, recent catheterization, medical management recommended For continued phone calls to the office for chest pain he can go back on the Ranexa No further ischemic work-up needed  Okay to increase metoprolol succinate up to 50 History of anxiety may be contributing to high blood pressure

## 2018-10-16 ENCOUNTER — Telehealth: Payer: Self-pay | Admitting: Cardiovascular Disease

## 2018-10-16 NOTE — Telephone Encounter (Signed)
Patient spouse calling States that patient is having trouble breathing and it is causing some chest pain Would like to know if Dr Rockey Situ will call in prednisone to [facility] in [place] Please call to discuss

## 2018-10-16 NOTE — Telephone Encounter (Signed)
Returned the call to the patient's wife. She stated that the patient is having a barking cough as he has had in the past. She called his pulmonologist and asked for a refill of prednisone which she stated that they denied. She has been advised to call his PCP or make an appointment with pulmonology as that medication and cough symptoms would require a visit to make sure there was not anything else going on.   She stated that the patient's chest has started to hurt when he coughs which she described as chest pain. She was asked to describe the pain and stated that it was only when he coughs. She feels like he may end up back in the hospital. She has been advised to call the pulmonologist and/or PCP for an appointment or for further recommendations on his cough. If she feels like it is emergent then she has been advised to go to the ED. She has verbalized her understanding.

## 2018-10-17 DIAGNOSIS — J441 Chronic obstructive pulmonary disease with (acute) exacerbation: Secondary | ICD-10-CM | POA: Diagnosis not present

## 2018-10-17 DIAGNOSIS — J449 Chronic obstructive pulmonary disease, unspecified: Secondary | ICD-10-CM | POA: Diagnosis not present

## 2018-10-19 ENCOUNTER — Telehealth: Payer: Self-pay | Admitting: Internal Medicine

## 2018-10-19 NOTE — Telephone Encounter (Signed)
Received a referral for pt to be seen for COPD but pt is already established with Dr. Juanell Fairly. Called to schedule f.u appt. and after speaking with wife she stated that pt would like to see Dr. Alva Garnet instead. Let her know of provider switch protocol and she understood.

## 2018-10-19 NOTE — Telephone Encounter (Signed)
DR and DS please advise on switch. Thanks

## 2018-10-19 NOTE — Telephone Encounter (Signed)
Ok. Pt has not been seen here in 3 yrs, so should be scheduled as a new consult when seeing DS.

## 2018-10-25 NOTE — Telephone Encounter (Signed)
Okay to switch to DS.  Shalisha please schedule. Thanks

## 2018-10-25 NOTE — Telephone Encounter (Signed)
30 minute. New patient consult  Thanks  Waunita Schooner

## 2018-10-25 NOTE — Telephone Encounter (Signed)
Office Visit has been scheduled. Nothing further needed.

## 2018-10-31 ENCOUNTER — Telehealth: Payer: Self-pay | Admitting: Cardiovascular Disease

## 2018-10-31 DIAGNOSIS — R0602 Shortness of breath: Secondary | ICD-10-CM | POA: Diagnosis not present

## 2018-10-31 DIAGNOSIS — R062 Wheezing: Secondary | ICD-10-CM | POA: Diagnosis not present

## 2018-10-31 NOTE — Telephone Encounter (Signed)
Patient c/o Palpitations:  High priority if patient c/o lightheadedness, shortness of breath, or chest pain  1) How long have you had palpitations/irregular HR/ Afib? Are you having the symptoms now? 1 month / yes   2) Are you currently experiencing lightheadedness, SOB or CP? Sob productive cough   3) Do you have a history of afib (atrial fibrillation) or irregular heart rhythm? No   4) Have you checked your BP or HR? (document readings if available) HR 100-140   5) Are you experiencing any other symptoms? No

## 2018-11-01 NOTE — Telephone Encounter (Signed)
DPR on file. Spoke with the pt wife. Pt wife sts that a NP from [facility] was out to the pt home yesterday. Pt has been complaining of a productive cough and elevated HR for a couple of weeks, he does have COPD and a hx of atrial tach. His BP during the visit (L) arm 170/98, (R) arm 150/98. Pt wife unable to provide the pt HR but sts that the NP noted increase HR and decreased Os with ambulation. He did not take hydralazine as recommended for diastolic bp >40. A chest xray was performed during the visit, they were told that it was "ok". The pt is normally on 2.5L of O2, it was increased yesterday to 3L. They were told that because the pt nasal cannula is several feet long "he may not be getting enough". He checks his O@ and HR several times daily with a pulse ox. Pt BP last night 148/80 HR 101bpm. Pt has not checked his BP or HR today. Pt has had two prednisone tapers in the last 6 weeks. His productive cough has improved. Pt does report intermittent CP since the d/c of Ranexa. The CP usually occurs in the morning after he take his medications. The pt is taking Metoprolol succ 50mg  daily as prescribed.  Adv the pt wife that Dr. Rockey Situ is out of the office. I will the message to our DOD and call back with their recommendation. Pt is to call back in the interim if the pt symptoms worsen. Mrs. Lichtenwalner is agreeable with the plan and voiced appreciation for the call.

## 2018-11-01 NOTE — Telephone Encounter (Signed)
It appears that chest pain and elevated heart rates have been an issue in the past.  Most recent EKG in 09/2018 showed sinus tachycardia with HR of 110 bpm.  Ranolazine was started after recent cath for small vessel disease with significant improvement in chest pain (patient decided to stop medication because chest pain improved per prior phone notes).  I recommend that Mr. Havey start taking ranolazine 500 mg BID again.  He should f/u with Dr. Rockey Situ when he is back in the office.  I will defer management of cough and chronic hypoxia to Mr. Lubinski's PCP and pulmonary.  If sx worsen in the meantime, he should go to the ED.  Nelva Bush, MD [facility] Pager: [phone]

## 2018-11-01 NOTE — Telephone Encounter (Addendum)
Spoke with the pt wife and made her aware of Dr. Darnelle Bos recommendation. Pt wife sts that Ranexa was d/c due to tremors, pt has not had any roccurring tremor episodes of tremor since Ranexa was d/c. The pt will f/u with pulmonology and pcp regarding chronic hypoxia and cough. Pt wife sts that that the [facility] NP adv him that his heartrate was "irregular" based on the pulse ox readings not auscultation. Pt denies palpitations. Adv pt wife that I will fwd the message to Dr. Rockey Situ when he returns to determine which type of f/u is appropriate virtual vs in office. Adv her that the pt should go to the ED if symptoms worsen. Pt wife verbalized understanding and voiced appreciation for the call.

## 2018-11-02 NOTE — Telephone Encounter (Signed)
Spoke with wife and patient again. Patient not having any more chest pain at this time. She says SOB is still the same as usual, no worse. They verbalized understanding to proceed to ER if symptoms return. I stressed the importance of this and that even if it was tomorrow morning on their way to the appointment with [place], they should go to the ER instead if he is having chest pain or increasing SOB.

## 2018-11-02 NOTE — Telephone Encounter (Signed)
Please call to discuss HR(110) and sharp intermittent CP in the mornings. States O2 stats drop in the 90s when he gets up. Please call to discuss.

## 2018-11-02 NOTE — Telephone Encounter (Signed)
They are also aware of the process of entering through the [facility]. They are aware that patient must come by himself. I advised them to ask the valet for a wheelchair and for them to bring him to our office when he arrives. They are aware to wear a mask.

## 2018-11-02 NOTE — Telephone Encounter (Signed)
Spoke with patient and wife.  Today, patient's HR was 115. Oxygen level at this time is 98%. They have not taken BP yet. He is sitting outside getting fresh air. Patient had 3-4 episodes of chest pain this morning last a couple minutes at a time. One lasted 10 minutes and accompanied with left arm pain. Now he has not had CP for a while. SOB is at baseline. He reports sores in his mouth which he thinks is from starting the new inhaler, Dulero. They would be willing to do a Virtual Visit tomorrow. I let them know I will have the provider review and let them know if in person is necessary.

## 2018-11-02 NOTE — Telephone Encounter (Signed)
If chest pain or SOB are persisting I recommend he proceed to the ED for evaluation today. If he is asymptomatic, he should be seen in person.

## 2018-11-03 ENCOUNTER — Other Ambulatory Visit: Payer: Self-pay

## 2018-11-03 ENCOUNTER — Ambulatory Visit (INDEPENDENT_AMBULATORY_CARE_PROVIDER_SITE_OTHER): Payer: PPO

## 2018-11-03 ENCOUNTER — Ambulatory Visit (INDEPENDENT_AMBULATORY_CARE_PROVIDER_SITE_OTHER): Payer: PPO | Admitting: Physician Assistant

## 2018-11-03 ENCOUNTER — Encounter: Payer: Self-pay | Admitting: Physician Assistant

## 2018-11-03 VITALS — BP 148/88 | HR 113 | Ht 69.0 in | Wt 198.0 lb

## 2018-11-03 DIAGNOSIS — I5032 Chronic diastolic (congestive) heart failure: Secondary | ICD-10-CM

## 2018-11-03 DIAGNOSIS — R9431 Abnormal electrocardiogram [ECG] [EKG]: Secondary | ICD-10-CM | POA: Diagnosis not present

## 2018-11-03 DIAGNOSIS — I471 Supraventricular tachycardia: Secondary | ICD-10-CM | POA: Diagnosis not present

## 2018-11-03 DIAGNOSIS — I25118 Atherosclerotic heart disease of native coronary artery with other forms of angina pectoris: Secondary | ICD-10-CM | POA: Diagnosis not present

## 2018-11-03 DIAGNOSIS — N183 Chronic kidney disease, stage 3 unspecified: Secondary | ICD-10-CM

## 2018-11-03 DIAGNOSIS — R000 Tachycardia, unspecified: Secondary | ICD-10-CM | POA: Diagnosis not present

## 2018-11-03 DIAGNOSIS — J441 Chronic obstructive pulmonary disease with (acute) exacerbation: Secondary | ICD-10-CM

## 2018-11-03 DIAGNOSIS — R002 Palpitations: Secondary | ICD-10-CM

## 2018-11-03 DIAGNOSIS — J9621 Acute and chronic respiratory failure with hypoxia: Secondary | ICD-10-CM | POA: Diagnosis not present

## 2018-11-03 DIAGNOSIS — I4719 Other supraventricular tachycardia: Secondary | ICD-10-CM

## 2018-11-03 MED ORDER — METOPROLOL SUCCINATE ER 25 MG PO TB24: 75.0000 mg | ORAL_TABLET | Freq: Every day | ORAL | 3 refills | Status: DC

## 2018-11-03 NOTE — Patient Instructions (Signed)
Medication Instructions:  Your physician has recommended you make the following change in your medication:  1- INCREASE Toprol to 3 tablets (75 mg total) once daily  If you need a refill on your cardiac medications before your next appointment, please call your pharmacy.   Lab work: 1- Your physician recommends that you return for lab work today (BMET, CBC, [place], Tsh) at [facility].  No appt is needed. Hours are M-F 7AM- 6 PM.  If you have labs (blood work) drawn today and your tests are completely normal, you will receive your results only by: Marland Kitchen MyChart Message (if you have MyChart) OR . A paper copy in the mail If you have any lab test that is abnormal or we need to change your treatment, we will call you to review the results.  Testing/Procedures: 1- A zio monitor was placed today. It will remain on for 14 days. You will then return monitor and event diary in provided box. It takes 1-2 weeks for report to be downloaded and returned to [place]. We will call you with the results. If monitor falls of or has orange flashing light, please call [employer] for further instructions.    Follow-Up: At [facility], you and your health needs are our priority.  As part of our continuing mission to provide you with exceptional heart care, we have created designated Provider Care Teams.  These Care Teams include your primary Cardiologist (physician) and Advanced Practice Providers (APPs -  Physician Assistants and Nurse Practitioners) who all work together to provide you with the care you need, when you need it. You will need a follow up appointment in 1 months.  You may see Ida Rogue, MD or Christell Faith, PA-C.

## 2018-11-03 NOTE — Progress Notes (Signed)
Cardiology Office Note Date:  11/03/2018  Patient ID:  William Mueller, William Mueller 04-Nov-1950, MRN [id number] PCP:  Philmore Pali, NP  Cardiologist:  Dr. Rockey Situ, MD    Chief Complaint: SOB/chest pain  History of Present Illness: William Mueller is a 68 y.o. male with history of CAD s/p PCI to the RCA in 12/2013 with ongoing chronic chest pain s/p multiple repeat caths as outlined below, CKD stage III, COPD secondary to long history of tobacco abuse with E-cigarette use on home oxygen, HFpEF, paroxysmal atrial tachycardia, labile hypertension with orthostatic hypotension, depression, anxiety, HTN, HLD, and GERD who presents for evaluation of SOB/chest pain.  Admitted to [facility] in 12/2013 with chest pain. Underwent LHC on /01/2014 that showed left main normal, mid LAD diffuse 20%, ostial D1 50%, prox D1 50%, D2 30%, mid LCx moderately calcified with 30%, RCA mildly calcified with diffuse 30% proximally. The vessel was occluded in the midsegment. Faint left-to-right collaterals were noted. He underwent successful PCI/DES to the RCA. Echo showed EF 55-60%, mild LVH, GR1DD, inferior HK, elevated CVP, dilated IVC suggestive of elevated RA pressure. Admitted again in 02/2014 with chest pain, ruled out, left AMA. Admitted 02/2015 with chest pain. Ruled out. Underwent LHC that showed patent RCA stent with proximal RCA 55%, d1 80%, mid LCx to distal LCx 90%. Admitted 09/2015 with chest pain, ruled out, LHC showed patent RCA stent with unchanged nonobstructive disease when compared to prior LHC. FFR of RCA lesion not significant at 0.93. Medical management was advised. Echo in 09/2017 showed an EF of 65-70%, normal wall motion, Gr2DD. Most recently, he was admitted in 09/2018 with recurrent chest pain. Troponin minimally elevated peaking at 0.04. He underwent repeat LHC that showed left main without significant disease, D1 80% (small to moderate size vessel), proximal LCx 30%, mid to distal LCx 90% (small vessel), proximal RCA 30%  prior to the previously placed stent in the proximal to mid region without significant ISR. LVEF 55%. Medical management was advised. He was started on Ranexa 500 mg bid. In telehealth follow up he was feeling well. He stated "cathereization fixed me." He was reminded no intervention was performed and he was started on Ranexa.   Following this, the patient stopped Ranexa on his own stating his chest pain was improved. He also noted a jittery feeling with this medication and possible difficulty in emptying his bladder. There have been multiple phone calls noting mildly elevated BP and HR. He later called asking for a prescription of prednisone for a barking cough after pulmonology denied this request. He was advised to follow up with his PCP or pulmonologist.  He subsequently followed up with his pulmonologist and was prescribed a short course of prednisone which temporarily helped symptoms though they returned.  He then followed up with his PCP via a virtual visit and was prescribed a longer course of prednisone.  Since then, he has noted a significant improvement in his cough with less production of yellow sputum.  He has also noted improvement in chest discomfort while coughing as well.  However, due to continued intermittent chest discomfort associated with cough he contacted our office for further evaluation.  Over the phone, O2 saturation was noted to be 98% on supplemental oxygen at baseline 3 L via nasal cannula. He was noted to be mildly tachycardic at 115 bpm.   Labs: 09/2018 - WBC 9.7, HGB 12.0, SCr 1.68 (baseline 1.5-1.6), K+ 3.5, LDL 52, TSH normal, A1c 5.7, magnesium 1.8  He comes in today noting his shortness of breath and chest pain are significantly improved when compared to the above.  However, he continues to note some exertional shortness of breath as well as tachypalpitations when ambulating throughout the house.  He states while at rest his heart rate is typically in the 90s bpm though  with ambulation to and from the restroom, which is approximately 7 feet each way he reports, he notes his heart rate will get into the 1 teens to 120s bpm.  He denies any lower extremity swelling, vascular trauma, or recent sedentary periods.  He is transitioning from his prior pulmonologist to [facility] pulmonology and has an appointment with them next week.  He remains on a baseline 3 L oxygen via nasal cannula.  He indicates home health nurse was at his house several weeks ago and felt like his heart rate was irregular.  Patient is concerned he may have been having episodes of A. fib.  He denies any dizziness, presyncope, or syncope.  Stable orthopnea.  No PND or early satiety.  Currently feels close to his baseline.  Past Medical History:  Diagnosis Date   Anginal pain ([place]) 06/2017   Anxiety    Bronchitis 06/2017   Chronic chest pain    Chronic kidney disease (CKD) stage G3b/A1, moderately decreased glomerular filtration rate (GFR) between 30-44 mL/min/1.73 square meter and albuminuria creatinine ratio less than 30 mg/g (HCC)    Chronic lower back pain    WITH LEG WEAKNESS   COPD (chronic obstructive pulmonary disease) (HCC)    EMPHYSEMA, O2 AT 2L PRN   Coronary artery disease    a. 12/2013 PCI: mRCA 100% with L to R collats s/p PCI/DES;  b. 06/2014 MV: no ischemia/infarct, EF 53%;  c. 03/2015 Cath: LM nl, LAD nl, D1 80 (1.58mm), LCX 32m (<1.26mm), OM1 nl, RCA 55p, patent stent, RPDA nl, EF 65%; d. 09/2015 Cath: LM nl, LAD 49m, D1 80 (<17mm), LCX 46m/d (<1.80mm), OM1 nl, RCA 55p (FFR 0.93), patent stent, RPDA nl-->Med Rx.   Daily headache    Depression    Diastolic dysfunction    a. echo 12/2013: EF 55-60%, mild LVH, GR1DD, inf HK, elevated CVP, mildly dilated IVC suggestive of increased RA pressure   Dyspnea    WHEEZING   GERD (gastroesophageal reflux disease)    REFLUX   Hearing loss    High cholesterol    Hypertension    Myocardial infarction ([place]) 2015   Nicotine addiction     a. using eCigs.   Orthopnea    Sleep apnea     Past Surgical History:  Procedure Laterality Date   CARDIAC CATHETERIZATION     MC x 1 stent   CARDIAC CATHETERIZATION Left 03/12/2015   Procedure: Left Heart Cath and Coronary Angiography;  Surgeon: Leonie Man, MD;  Location: [place] [facility];  Service: Cardiovascular;  Laterality: Left;   CARDIAC CATHETERIZATION N/A 09/29/2015   Procedure: Left Heart Cath and Coronary Angiography;  Surgeon: Wellington Hampshire, MD;  Location: [place] [facility];  Service: Cardiovascular;  Laterality: N/A;   CATARACT EXTRACTION W/PHACO Right 08/10/2017   Procedure: CATARACT EXTRACTION PHACO AND INTRAOCULAR LENS PLACEMENT ([place]) RIGHT;  Surgeon: Leandrew Koyanagi, MD;  Location: [place];  Service: Ophthalmology;  Laterality: Right;   CORONARY ANGIOPLASTY     STENT PLACEMENT   LEFT HEART CATH AND CORONARY ANGIOGRAPHY N/A 09/06/2018   Procedure: LEFT HEART CATH AND CORONARY ANGIOGRAPHY;  Surgeon: Minna Merritts, MD;  Location: [place] [facility];  Service: Cardiovascular;  Laterality: N/A;   LEFT HEART CATHETERIZATION WITH CORONARY ANGIOGRAM N/A 12/12/2013   Procedure: LEFT HEART CATHETERIZATION WITH CORONARY ANGIOGRAM;  Surgeon: Wellington Hampshire, MD;  Location: [place] [facility];  Service: Cardiovascular;  Laterality: N/A;    Current Meds  Medication Sig   acetaminophen (TYLENOL) 500 MG tablet Take 1,000 mg by mouth daily as needed for headache.    albuterol (PROVENTIL HFA;VENTOLIN HFA) 108 (90 Base) MCG/ACT inhaler Inhale into the lungs every 6 (six) hours as needed for wheezing or shortness of breath.   albuterol (PROVENTIL) (2.5 MG/3ML) 0.083% nebulizer solution Take 3 mLs (2.5 mg total) by nebulization every 6 (six) hours as needed for wheezing or shortness of breath.   ALPRAZolam (XANAX XR) 3 MG 24 hr tablet Take 3 mg by mouth 2 (two) times daily. 1 MG IN THE MORNING AND 3 MG IN THE EVENING   ALPRAZolam (XANAX) 1 MG tablet  Take 1 mg by mouth daily.    aspirin 81 MG EC tablet Take 1 tablet (81 mg total) by mouth daily. (Patient taking differently: Take 81 mg by mouth daily. BREAKFAST)   atorvastatin (LIPITOR) 80 MG tablet TAKE 1 TABLET BY MOUTH DAILY   ezetimibe (ZETIA) 10 MG tablet Take 1 tablet (10 mg total) by mouth daily.   furosemide (LASIX) 20 MG tablet Take 20 mg by mouth daily.    gabapentin (NEURONTIN) 100 MG capsule Take 300 mg by mouth 2 (two) times daily. Take 2 pills twice a day    hydrALAZINE (APRESOLINE) 25 MG tablet Take 25 mg by mouth as needed.   meclizine (ANTIVERT) 25 MG tablet Take 1 tablet (25 mg total) by mouth 3 (three) times daily as needed for dizziness.   metoprolol succinate (TOPROL-XL) 25 MG 24 hr tablet Take 2 tablets (50 mg total) by mouth daily.   mometasone-formoterol (DULERA) 100-5 MCG/ACT AERO Inhale 2 puffs into the lungs 2 (two) times daily.    nitroGLYCERIN (NITROSTAT) 0.4 MG SL tablet Place 1 tablet (0.4 mg total) under the tongue every 5 (five) minutes as needed for chest pain.   OXYGEN Inhale 2 L into the lungs daily as needed (oxygen).    roflumilast (DALIRESP) 500 MCG TABS tablet Take 1 tablet (500 mcg total) by mouth daily. (Patient taking differently: Take 500 mcg by mouth daily. LUNCH)   tiotropium (SPIRIVA) 18 MCG inhalation capsule Place 1 capsule (18 mcg total) into inhaler and inhale daily. (Patient taking differently: Place 18 mcg into inhaler and inhale daily. AM)    Allergies:   Paroxetine hcl; Serotonin reuptake inhibitors (ssris); Diazepam; Doxycycline monohydrate; Escitalopram oxalate; Lorazepam; and Tetracyclines & related   Social History:  The patient  reports that he quit smoking about 9 years ago. His smoking use included e-cigarettes. He quit after 48.00 years of use. He has never used smokeless tobacco. He reports current alcohol use of about 6.0 standard drinks of alcohol per week. He reports that he does not use drugs.   Family History:   The patient's family history includes CVA in his mother; Coronary artery disease in his brother and father; Heart disease in his father; Heart disease (age of onset: 39) in his brother.  ROS:   Review of Systems  Constitutional: Positive for malaise/fatigue. Negative for chills, diaphoresis, fever and weight loss.  HENT: Negative for congestion.   Eyes: Negative for discharge and redness.  Respiratory: Positive for cough, sputum production and shortness of breath. Negative for  hemoptysis and wheezing.        Improving cough and shortness of breath with sputum being yellow  Cardiovascular: Positive for chest pain and palpitations. Negative for orthopnea, claudication, leg swelling and PND.       Improving chest pain that was associated with cough  Gastrointestinal: Negative for abdominal pain, blood in stool, heartburn, melena, nausea and vomiting.  Genitourinary: Negative for hematuria.  Musculoskeletal: Negative for falls and myalgias.  Skin: Negative for rash.  Neurological: Positive for weakness. Negative for dizziness, tingling, tremors, sensory change, speech change, focal weakness and loss of consciousness.  Endo/Heme/Allergies: Does not bruise/bleed easily.  Psychiatric/Behavioral: Negative for substance abuse. The patient is not nervous/anxious.   All other systems reviewed and are negative.    PHYSICAL EXAM:  VS:  BP (!) 148/88 (BP Location: Right Arm, Patient Position: Sitting, Cuff Size: Normal)    Pulse (!) 113    Ht 5\' 9"  (1.753 m)    Wt 198 lb (89.8 kg)    SpO2 98% Comment: On supplemental oxygen via concentrator at 2 L   BMI 29.24 kg/m  BMI: Body mass index is 29.24 kg/m.  Physical Exam  Constitutional: He is oriented to person, place, and time. He appears well-developed and well-nourished.  HENT:  Head: Normocephalic and atraumatic.  Eyes: Right eye exhibits no discharge. Left eye exhibits no discharge.  Neck: Normal range of motion. No JVD present.  Cardiovascular:  Regular rhythm, S1 normal, S2 normal and normal heart sounds. Tachycardia present. Exam reveals no distant heart sounds, no friction rub, no midsystolic click and no opening snap.  No murmur heard. Pulses:      Posterior tibial pulses are 2+ on the right side and 2+ on the left side.  Mildly tachycardic on exam with heart rate in the low 100s to 110s bpm.  This was confirmed by pulse oximetry during exam.  Pulmonary/Chest: Effort normal and breath sounds normal. No respiratory distress. He has no decreased breath sounds. He has no wheezes. He has no rhonchi. He has no rales. He exhibits no tenderness.  Mildly coarse breath sounds throughout  Abdominal: Soft. He exhibits no distension. There is no abdominal tenderness.  Musculoskeletal:        General: Edema present.     Comments: Trace bilateral pretibial edema  Neurological: He is alert and oriented to person, place, and time.  Skin: Skin is warm and dry. No cyanosis. Nails show no clubbing.  Psychiatric: He has a normal mood and affect. His speech is normal and behavior is normal. Judgment and thought content normal.     EKG:  Was ordered and interpreted by me today. Shows sinus tachycardia, 115 bpm, nonspecific lateral ST-T changes (potentially rate related) discussed with Dr. Fletcher Anon  Recent Labs: 05/15/2018: ALT 20 09/06/2018: Magnesium 1.8; TSH 1.212 09/07/2018: BUN 15; Creatinine, Ser 1.68; Hemoglobin 12.0; Platelets 254; Potassium 3.5; Sodium 137  09/07/2018: Cholesterol 98; HDL 31; LDL Cholesterol 52; Total CHOL/HDL Ratio 3.2; Triglycerides 75; VLDL 15   CrCl cannot be calculated (Patient's most recent lab result is older than the maximum 21 days allowed.).   Wt Readings from Last 3 Encounters:  11/03/18 198 lb (89.8 kg)  09/07/18 205 lb (93 kg)  06/08/18 205 lb (93 kg)     Other studies reviewed: Additional studies/records reviewed today include: summarized above  ASSESSMENT AND PLAN:  1. Acute on chronic hypoxic respiratory  failure: Likely in the setting of recent COPD exacerbation which is significantly improved.  He is  transitioning pulmonologist as outlined below and has an appointment with his new pulmonologist next week.  Check CBC to evaluate for underlying anemia.  He appears euvolemic and well compensated on exam today.  Less likely diastolic CHF exacerbation; continue current dose of low-dose Lasix.  2. CAD involving the native coronary arteries with stable angina: Cath images reviewed in detail with interventional cardiology today prior to his appointment.  Cath appears to show a diffusely diseased small in size AV groove left circumflex measuring approximately 1 mm as well as moderate stenosis of a small diagonal and nonobstructive disease affecting the RCA proximal to the previously placed stent.  There is no evidence of large vessel obstructive disease.  His previously noted intermittent angina may have been exacerbated by coughing in the setting of COPD exacerbation as well as rate related.  Unfortunately, he did not tolerate Ranexa and has discontinued this medication.  He did appear to respond to it however.  We have agreed, after discussing with interventional cardiology, to escalate his Toprol-XL to 75 mg daily for antianginal effect as well as for rate control.  Moving forward, would continue to escalate antianginal therapy as needed with addition of amlodipine followed by Imdur.  Continue aspirin, Lipitor, and Zetia.  Aggressive risk factor modification and secondary prevention.  No plans for further ischemic evaluation at this time.  3. Palpitations/atrial tachycardia/sinus tachycardia: Possibly in the setting of increased albuterol usage with recent COPD exacerbation as well as several rounds of prednisone taper.  Oxygen saturation 98% in the office today.  No recent vascular trauma or prolonged sedentary periods.  Placed ZIO patch today to evaluate heart rate and rhythm.  Patient has reported home health  nurse had concerns for A. fib, which would be a new diagnosis for him.  Without objective evidence demonstrating this we will not place the patient on full dose anticoagulation at this time.  Check CBC to evaluate for worsening anemia playing a role in his sinus tachycardia, palpitations, and shortness of breath.  We will also check BMP, TSH, and magnesium.  Increase metoprolol as outlined above to achieve goal heart rate less than 100 bpm.  4. Abnormal EKG: Recent cardiac cath as above demonstrating small vessel disease without any large vessel obstructive disease.  Possibly rate related.  Titrate metoprolol as above.  Currently without chest pain.  5. COPD exacerbation: On chronic supplemental oxygen as above at baseline.  Exacerbation is significantly improved following recent prednisone taper.  He is transitioning to [facility] pulmonology and has an appointment with them next week.  6. HFpEF: Appears euvolemic and well compensated.  Continue current dose of Lasix.  Check BMP as below.  7. CKD stage III: Check BMP today.  Disposition: F/u with Dr. Rockey Situ or an APP in 1 month.  Current medicines are reviewed at length with the patient today.  The patient did not have any concerns regarding medicines.  Signed, Christell Faith, PA-C 11/03/2018 11:43 AM     [facility] [address] [place] [address] [place], [place] [address] [phone]

## 2018-11-06 ENCOUNTER — Telehealth: Payer: Self-pay | Admitting: Pulmonary Disease

## 2018-11-06 DIAGNOSIS — J449 Chronic obstructive pulmonary disease, unspecified: Secondary | ICD-10-CM | POA: Diagnosis not present

## 2018-11-06 NOTE — Telephone Encounter (Signed)
Called patient for COVID-19 pre-screening for in office visit. ° °Have you recently traveled any where out of the local area in the last 2 weeks? NO ° °Have you been in close contact with a person diagnosed with COVID-19 within the last 2 weeks? NO ° °Do you currently have any of the following symptoms? If so, when did they start? °Cough     Diarrhea   Joint Pain °Fever      Muscle Pain   Red eyes °Shortness of breath   Abdominal pain  Vomiting °Loss of smell    Rash    Sore Throat °Headache    Weakness   Bruising or bleeding ° ° °Okay to proceed with visit 11/07/2018 °  ° ° ° °

## 2018-11-07 ENCOUNTER — Other Ambulatory Visit: Payer: Self-pay

## 2018-11-07 ENCOUNTER — Ambulatory Visit: Payer: PPO | Admitting: Pulmonary Disease

## 2018-11-07 ENCOUNTER — Encounter: Payer: Self-pay | Admitting: Pulmonary Disease

## 2018-11-07 VITALS — BP 142/84 | HR 107 | Ht 69.0 in | Wt 194.7 lb

## 2018-11-07 DIAGNOSIS — J449 Chronic obstructive pulmonary disease, unspecified: Secondary | ICD-10-CM | POA: Diagnosis not present

## 2018-11-07 DIAGNOSIS — J9611 Chronic respiratory failure with hypoxia: Secondary | ICD-10-CM | POA: Diagnosis not present

## 2018-11-07 DIAGNOSIS — F17299 Nicotine dependence, other tobacco product, with unspecified nicotine-induced disorders: Secondary | ICD-10-CM | POA: Diagnosis not present

## 2018-11-07 MED ORDER — MOMETASONE FURO-FORMOTEROL FUM 100-5 MCG/ACT IN AERO: 2.0000 | INHALATION_SPRAY | Freq: Two times a day (BID) | RESPIRATORY_TRACT | 0 refills | Status: DC

## 2018-11-07 NOTE — Patient Instructions (Signed)
Continue current medications: Dulera, Spiriva, Daliresp, oxygen  Continue albuterol (inhaler or nebulizer) as needed for increased shortness of breath, wheezing, chest tightness, cough.  I recommended increased use of albuterol inhaler as needed.  You may use a half dose of the nebulizer if you find that the side effects of a full dose are excessive  We discussed quitting vaping in detail.  I recommended either trying to fully liberate yourself from a nicotine addiction or looking at other forms of nicotine replacement therapy including patches (14 mg strength recommended) or lozenges or gum (2 mg strength recommended)  I recommended increasing activity level, especially walking specifically for exercise.  Try to do this in the morning when the weather is cooler  Follow-up in 3 months.  Call sooner if needed

## 2018-11-12 ENCOUNTER — Encounter: Payer: Self-pay | Admitting: Pulmonary Disease

## 2018-11-12 NOTE — Progress Notes (Signed)
PULMONARY CONSULT NOTE  Requesting MD/Service: Self Date of initial consultation: 11/07/18 Reason for consultation: Very severe COPD, former smoker, chronic hypoxemic respiratory failure  PT PROFILE: 68 y.o. male former smoker (approx 100 p-y history, quit 2011, still vaping occasionally previously seen by Dr Ashby Dawes and Dr Raul Del  DATA: 09/29/17 CTA chest: no PE. No significant emphysema 12/23/15 PFTs: FVC: 2.10 > 2.33  L (47>53 %pred), FEV1: 0.91 >1.04 L (27% >31 %pred), FEV1/FVC: 43%, TLC:  L ( %pred), DLCO 46 %pred    INTERVAL:  HPI:  As above. Presently he is at baseline after a recent COPD exacerbation. He was first diagnosed with COPD in 2008. He has been on O2 approx 1 year. He has been hospitalized twice for COPD exacerbations, never intubated. Over the years he has been to ED or urgent care centers several times for COPD exacerbations. He has class III-IV dyspnea @ baseline. He reports minimal cough and sputum, never had hemoptysis, denies pleuritic CP. He does frequently get chest tightness with exertion. He has been evaluated by cardiology for this. His symptoms are exacerbated by dust exposure and changes in the weather. Only rest and calming breathing techniques help relieve symptoms when he is severely dyspneic.   He is maintained on Dulera, Spiriva and uses albuterol (MDI or nebs) 2-3 times per day.   He was previously offered pulmonary rehab but could not afford the co-pay.   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 He has been on disability since 2009 due to COPD  He has no other significant environmental exposure and no significant travel history.   He denies orthopnea, PND. He does have infrequent ankle and pedal edema  Past Medical History:  Diagnosis Date  . Anginal pain ([place]) 06/2017  . Anxiety   . Bronchitis 06/2017  . Chronic chest pain   .  Chronic kidney disease (CKD) stage G3b/A1, moderately decreased glomerular filtration rate (GFR) between 30-44 mL/min/1.73 square meter and albuminuria creatinine ratio less than 30 mg/g (HCC)   . Chronic lower back pain    WITH LEG WEAKNESS  . COPD (chronic obstructive pulmonary disease) (HCC)    EMPHYSEMA, O2 AT 2L PRN  . Coronary artery disease    a. 12/2013 PCI: mRCA 100% with L to R collats s/p PCI/DES;  b. 06/2014 MV: no ischemia/infarct, EF 53%;  c. 03/2015 Cath: LM nl, LAD nl, D1 80 (1.42mm), LCX 76m (<1.80mm), OM1 nl, RCA 55p, patent stent, RPDA nl, EF 65%; d. 09/2015 Cath: LM nl, LAD 83m, D1 80 (<3mm), LCX 61m/d (<1.55mm), OM1 nl, RCA 55p (FFR 0.93), patent stent, RPDA nl-->Med Rx.  . Daily headache   . Depression   . Diastolic dysfunction    a. echo 12/2013: EF 55-60%, mild LVH, GR1DD, inf HK, elevated CVP, mildly dilated IVC suggestive of increased RA pressure  . Dyspnea    WHEEZING  . GERD (gastroesophageal reflux disease)    REFLUX  . Hearing loss   . High cholesterol   . Hypertension   . Myocardial infarction ([place]) 2015  . Nicotine addiction    a. using eCigs.  . Orthopnea   . Sleep apnea     Past Surgical History:  Procedure Laterality Date  . CARDIAC CATHETERIZATION     MC x 1 stent  . CARDIAC CATHETERIZATION Left 03/12/2015   Procedure: Left Heart Cath and Coronary Angiography;  Surgeon: Leonie Man, MD;  Location: [facility];  Service: Cardiovascular;  Laterality: Left;  . CARDIAC CATHETERIZATION N/A 09/29/2015   Procedure: Left Heart Cath and Coronary Angiography;  Surgeon: Wellington Hampshire, MD;  Location: [facility];  Service: Cardiovascular;  Laterality: N/A;  . CATARACT EXTRACTION W/PHACO Right 08/10/2017   Procedure: CATARACT EXTRACTION PHACO AND INTRAOCULAR LENS PLACEMENT ([place]) RIGHT;  Surgeon: Leandrew Koyanagi, MD;  Location: [facility];  Service: Ophthalmology;  Laterality: Right;  . CORONARY ANGIOPLASTY     STENT PLACEMENT  .  LEFT HEART CATH AND CORONARY ANGIOGRAPHY N/A 09/06/2018   Procedure: LEFT HEART CATH AND CORONARY ANGIOGRAPHY;  Surgeon: Minna Merritts, MD;  Location: [facility];  Service: Cardiovascular;  Laterality: N/A;  . LEFT HEART CATHETERIZATION WITH CORONARY ANGIOGRAM N/A 12/12/2013   Procedure: LEFT HEART CATHETERIZATION WITH CORONARY ANGIOGRAM;  Surgeon: Wellington Hampshire, MD;  Location: [facility];  Service: Cardiovascular;  Laterality: N/A;    MEDICATIONS: I have reviewed all medications and confirmed regimen as documented  Social History   Socioeconomic History  . Marital status: Married    Spouse name: Not on file  . Number of children: Not on file  . Years of education: Not on file  . Highest education level: Not on file  Occupational History  . Not on file  Social Needs  . Financial resource strain: Not on file  . Food insecurity:    Worry: Not on file    Inability: Not on file  . Transportation needs:    Medical: Not on file    Non-medical: Not on file  Tobacco Use  . Smoking status: Former Smoker    Years: 100.00    Types: E-cigarettes    Last attempt to quit: 06/07/2009    Years since quitting: 9.4  . Smokeless tobacco: Never Used  . Tobacco comment: Using electronic cigarette  Substance and Sexual Activity  . Alcohol use: Yes    Alcohol/week: 6.0 standard drinks    Types: 6 Cans of beer per week    Comment: Occasional   . Drug use: No  . Sexual activity: Not Currently  Lifestyle  . Physical activity:    Days per week: Not on file    Minutes per session: Not on file  . Stress: Not on file  Relationships  . Social connections:    Talks on phone: Not on file    Gets together: Not on file    Attends religious service: Not on file    Active member of club or organization: Not on file    Attends meetings of clubs or organizations: Not on file    Relationship status: Not on file  . Intimate partner violence:    Fear of current or ex partner: Not on file     Emotionally abused: Not on file    Physically abused: Not on file    Forced sexual activity: Not on file  Other Topics Concern  . Not on file  Social History Narrative  . Not on file    Family History  Problem Relation Age of Onset  . Coronary artery disease Brother   . Heart disease Brother 88  . CVA Mother   . Heart disease Father        MI x 8  . Coronary artery disease Father   . Kidney disease Neg Hx   . Prostate cancer Neg Hx     ROS: No fever, myalgias/arthralgias,  unexplained weight loss or weight gain No new focal weakness or sensory deficits No otalgia, hearing loss, visual changes, nasal and sinus symptoms, mouth and throat problems No neck pain or adenopathy No abdominal pain, N/V/D, diarrhea, change in bowel pattern No dysuria, change in urinary pattern   Vitals:   11/07/18 1055  BP: (!) 142/84  Pulse: (!) 107  SpO2: 96%  Weight: 194 lb 11.2 oz (88.3 kg)  Height: 5\' 9"  (1.753 m)     EXAM:  Gen: chronically ill appearing, No overt respiratory distress HEENT: NCAT, sclera white, oropharynx normal Neck: Supple without LAN, thyromegaly, JVD Lungs: breath sounds diffusely diminished without wheezes or other adventitious sounds Cardiovascular: Mildly tachy, reg, no murmurs noted Abdomen: Soft, nontender, normal BS Ext: without clubbing, cyanosis, edema Neuro: CNs grossly intact, motor and sensory intact Skin: Limited exam, no lesions noted  DATA:   BMP Latest Ref Rng & Units 09/07/2018 09/06/2018 06/08/2018  Glucose 70 - 99 mg/dL 97 103(H) 141(H)  BUN 8 - 23 mg/dL 15 14 17   Creatinine 0.61 - 1.24 mg/dL 1.68(H) 1.61(H) 1.60(H)  Sodium 135 - 145 mmol/L 137 138 135  Potassium 3.5 - 5.1 mmol/L 3.5 3.2(L) 3.4(L)  Chloride 98 - 111 mmol/L 102 99 97(L)  CO2 22 - 32 mmol/L 27 29 28   Calcium 8.9 - 10.3 mg/dL 8.5(L) 8.5(L) 8.6(L)    CBC Latest Ref Rng & Units 09/07/2018 09/06/2018 06/08/2018  WBC 4.0 - 10.5 K/uL 9.7 9.9 11.7(H)  Hemoglobin 13.0 - 17.0 g/dL 12.0(L)  10.5(L) 12.9(L)  Hematocrit 39.0 - 52.0 % 37.3(L) 32.0(L) 40.9  Platelets 150 - 400 K/uL 254 207 297    CXR 09/06/18:  No acute findings  I have personally reviewed all chest radiographs reported above including CXRs and CT chest unless otherwise indicated  IMPRESSION:     ICD-10-CM   1. COPD, very severe ([place]) J44.9   2. Chronic hypoxemic respiratory failure (HCC) J96.11   3. Nicotine addiction, vaping F17.299     PLAN:  Continue current medications: Dulera, Spiriva, Daliresp, oxygen  Continue albuterol (inhaler or nebulizer) as needed for increased shortness of breath, wheezing, chest tightness, cough.  I recommended increased use of albuterol inhaler as needed.  He may use a half dose of the nebulizer if he finds that the side effects of a full dose are excessive  We discussed quitting vaping in detail.  I recommended either trying to fully liberate himself from a nicotine addiction or looking at other forms of nicotine replacement therapy including patches (14 mg strength recommended) or lozenges or gum (2 mg strength recommended)  I recommended increasing activity level, especially walking specifically for exercise.  Recommended that he try to do this in the morning when the weather is cooler  Follow-up in 3 months.  Call sooner if needed   Merton Border, MD PCCM service Mobile [phone] Pager [phone] 11/12/2018 1:56 PM

## 2018-11-17 DIAGNOSIS — R002 Palpitations: Secondary | ICD-10-CM | POA: Diagnosis not present

## 2018-11-20 ENCOUNTER — Other Ambulatory Visit: Payer: Self-pay

## 2018-11-20 DIAGNOSIS — N183 Chronic kidney disease, stage 3 (moderate): Secondary | ICD-10-CM | POA: Diagnosis not present

## 2018-11-20 DIAGNOSIS — J449 Chronic obstructive pulmonary disease, unspecified: Secondary | ICD-10-CM | POA: Diagnosis not present

## 2018-11-20 DIAGNOSIS — F419 Anxiety disorder, unspecified: Secondary | ICD-10-CM | POA: Diagnosis not present

## 2018-11-20 DIAGNOSIS — F322 Major depressive disorder, single episode, severe without psychotic features: Secondary | ICD-10-CM | POA: Diagnosis not present

## 2018-12-04 ENCOUNTER — Telehealth: Payer: Self-pay | Admitting: *Deleted

## 2018-12-04 DIAGNOSIS — G8929 Other chronic pain: Secondary | ICD-10-CM

## 2018-12-04 DIAGNOSIS — R0602 Shortness of breath: Secondary | ICD-10-CM

## 2018-12-04 MED ORDER — METOPROLOL SUCCINATE ER 100 MG PO TB24: 100.0000 mg | ORAL_TABLET | Freq: Every day | ORAL | 3 refills | Status: DC

## 2018-12-04 NOTE — Telephone Encounter (Signed)
-----   Message from Rise Mu, PA-C sent at 12/04/2018 10:54 AM EDT ----- Cardiac monitoring showed NSR with an average rate of 104 bpm.  2 runs of SVT with the fastest interval lasting 6 beats with a maximum rate of 152 bpm and the longest episode lasting 8 beats with an average rate of 111 bpm.  Isolated PACs, atrial couplets, and triplets were rare.  Isolated PVCs and ventricular couplets were rare.  Ventricular bigeminy was noted.  Patient triggered events were not associated with significant arrhythmia.   -Recommend he follow up with previously ordered labs -Obtain echo -Increase Toprol XL to 100 mg daily

## 2018-12-04 NOTE — Telephone Encounter (Signed)
Done

## 2018-12-04 NOTE — Progress Notes (Deleted)
Cardiology Office Note Date:  12/04/2018  Patient ID:  William Mueller 11-05-1950, MRN [id number] PCP:  Philmore Pali, NP  Cardiologist:  Dr. Rockey Situ, MD  ***refresh   Chief Complaint: Follow up  History of Present Illness: William Mueller is a 68 y.o. male with history of CAD s/p PCI to the RCA in 12/2013 with ongoing chronic chest pain s/p multiple repeat caths as outlined below, CKD stage III, COPD secondary to long history of tobacco abuse with E-cigarette use on home oxygen, HFpEF, paroxysmal atrial tachycardia, labile hypertension with orthostatic hypotension, depression, anxiety, HTN, HLD, and GERD who presents for follow up of SOB/chest pain.  Admitted to [facility] in 12/2013 with chest pain. Underwent LHC on/01/2014 that showed left main normal, mid LAD diffuse 20%, ostial D1 50%, prox D1 50%, D2 30%, mid LCx moderately calcified with 30%, RCA mildly calcified with diffuse 30% proximally. The vessel was occluded in the midsegment. Faint left-to-right collaterals were noted. He underwent successful PCI/DES to the RCA. Echo showed EF 55-60%, mild LVH, GR1DD, inferior HK, elevated CVP, dilated IVC suggestive of elevated RA pressure.Admitted again in 02/2014 with chest pain, ruled out, left AMA. Admitted 02/2015 with chest pain. Ruled out. Underwent LHC that showed patent RCA stent with proximal RCA 55%, d1 80%, mid LCx to distal LCx 90%. Admitted 09/2015 with chest pain, ruled out, LHC showed patent RCA stent with unchanged nonobstructive disease when compared to prior LHC. FFR of RCA lesion not significant at 0.93. Medical management was advised. Echo in 09/2017 showed an EF of 65-70%, normal wall motion, Gr2DD. Most recently, he was admitted in 09/2018 with recurrent chest pain. Troponin minimally elevated peaking at 0.04. He underwent repeat LHC that showed left main without significant disease, D1 80% (small to moderate size vessel), proximal LCx 30%, mid to distal LCx 90% (small vessel), proximal RCA  30% prior to the previously placed stent in the proximal to mid region without significant ISR. LVEF 55%. Medical management was advised. He was started on Ranexa 500 mg bid. In telehealth follow up he was feeling well. He stated "cathereization fixed me." He was reminded no intervention was performed and he was started on Ranexa. Following this, the patient stopped Ranexa on his own stating his chest pain was improved. He also noted a jittery feeling with this medication and possible difficulty in emptying his bladder.  He has recently been evaluated by pulmonology and PCP for a barking cough with subsequent improvement following steroid taper.  He was most recently seen by myself on 11/03/2018 for evaluation of exertional shortness of breath and tachypalpitations.  He also noted he was transitioning to a different pulmonology group and has subsequently established with [facility].  Labs were recommended including TSH, magnesium, CBC, and BMP though not completed.  ZIO Patch showed normal sinus rhythm, average heart rate 104 bpm, 2 SVT runs occurring with the fastest interval lasting 6 beats with a maximum rate of 152 bpm and the longest lasting 8 beats with an average rate of 111 bpm, isolated PACs, atrial couplets/triplets were rare, isolated PVCs and ventricular couplets were rare.  Ventricular bigeminy was present.  He has subsequently established with a new pulmonologist for his severe COPD.  Labs: 09/2018 - WBC 9.7, HGB 12.0, SCr 1.68 (baseline 1.5-1.6), K+ 3.5, LDL 52, TSH normal, A1c 5.7, magnesium 1.8  ***   Past Medical History:  Diagnosis Date  . Anginal pain ([place]) 06/2017  . Anxiety   . Bronchitis 06/2017  .  Chronic chest pain   . Chronic kidney disease (CKD) stage G3b/A1, moderately decreased glomerular filtration rate (GFR) between 30-44 mL/min/1.73 square meter and albuminuria creatinine ratio less than 30 mg/g (HCC)   . Chronic lower back pain    WITH LEG WEAKNESS  . COPD  (chronic obstructive pulmonary disease) (HCC)    EMPHYSEMA, O2 AT 2L PRN  . Coronary artery disease    a. 12/2013 PCI: mRCA 100% with L to R collats s/p PCI/DES;  b. 06/2014 MV: no ischemia/infarct, EF 53%;  c. 03/2015 Cath: LM nl, LAD nl, D1 80 (1.42mm), LCX 78m (<1.22mm), OM1 nl, RCA 55p, patent stent, RPDA nl, EF 65%; d. 09/2015 Cath: LM nl, LAD 89m, D1 80 (<84mm), LCX 15m/d (<1.57mm), OM1 nl, RCA 55p (FFR 0.93), patent stent, RPDA nl-->Med Rx.  . Daily headache   . Depression   . Diastolic dysfunction    a. echo 12/2013: EF 55-60%, mild LVH, GR1DD, inf HK, elevated CVP, mildly dilated IVC suggestive of increased RA pressure  . Dyspnea    WHEEZING  . GERD (gastroesophageal reflux disease)    REFLUX  . Hearing loss   . High cholesterol   . Hypertension   . Myocardial infarction ([place]) 2015  . Nicotine addiction    a. using eCigs.  . Orthopnea   . Sleep apnea     Past Surgical History:  Procedure Laterality Date  . CARDIAC CATHETERIZATION     MC x 1 stent  . CARDIAC CATHETERIZATION Left 03/12/2015   Procedure: Left Heart Cath and Coronary Angiography;  Surgeon: Leonie Man, MD;  Location: [facility];  Service: Cardiovascular;  Laterality: Left;  . CARDIAC CATHETERIZATION N/A 09/29/2015   Procedure: Left Heart Cath and Coronary Angiography;  Surgeon: Wellington Hampshire, MD;  Location: [facility];  Service: Cardiovascular;  Laterality: N/A;  . CATARACT EXTRACTION W/PHACO Right 08/10/2017   Procedure: CATARACT EXTRACTION PHACO AND INTRAOCULAR LENS PLACEMENT ([place]) RIGHT;  Surgeon: Leandrew Koyanagi, MD;  Location: [facility];  Service: Ophthalmology;  Laterality: Right;  . CORONARY ANGIOPLASTY     STENT PLACEMENT  . LEFT HEART CATH AND CORONARY ANGIOGRAPHY N/A 09/06/2018   Procedure: LEFT HEART CATH AND CORONARY ANGIOGRAPHY;  Surgeon: Minna Merritts, MD;  Location: [facility];  Service: Cardiovascular;  Laterality: N/A;  . LEFT HEART CATHETERIZATION  WITH CORONARY ANGIOGRAM N/A 12/12/2013   Procedure: LEFT HEART CATHETERIZATION WITH CORONARY ANGIOGRAM;  Surgeon: Wellington Hampshire, MD;  Location: [facility];  Service: Cardiovascular;  Laterality: N/A;    No outpatient medications have been marked as taking for the 12/06/18 encounter (Appointment) with Rise Mu, PA-C.    Allergies:   Paroxetine hcl, Serotonin reuptake inhibitors (ssris), Diazepam, Doxycycline monohydrate, Escitalopram oxalate, Lorazepam, and Tetracyclines & related   Social History:  The patient  reports that he quit smoking about 9 years ago. His smoking use included e-cigarettes. He quit after 100.00 years of use. He has never used smokeless tobacco. He reports current alcohol use of about 6.0 standard drinks of alcohol per week. He reports that he does not use drugs.   Family History:  The patient's family history includes CVA in his mother; Coronary artery disease in his brother and father; Heart disease in his father; Heart disease (age of onset: 55) in his brother.  ROS:   ROS   PHYSICAL EXAM: *** VS:  There were no vitals taken for this visit. BMI: There is no height or weight  on file to calculate BMI.  Physical Exam   EKG:  Was ordered and interpreted by me today. Shows ***  Recent Labs: 05/15/2018: ALT 20 09/06/2018: Magnesium 1.8; TSH 1.212 09/07/2018: BUN 15; Creatinine, Ser 1.68; Hemoglobin 12.0; Platelets 254; Potassium 3.5; Sodium 137  09/07/2018: Cholesterol 98; HDL 31; LDL Cholesterol 52; Total CHOL/HDL Ratio 3.2; Triglycerides 75; VLDL 15   CrCl cannot be calculated (Patient's most recent lab result is older than the maximum 21 days allowed.).   Wt Readings from Last 3 Encounters:  11/07/18 194 lb 11.2 oz (88.3 kg)  11/03/18 198 lb (89.8 kg)  09/07/18 205 lb (93 kg)     Other studies reviewed: Additional studies/records reviewed today include: summarized above  ASSESSMENT AND PLAN:  1. ***  Disposition: F/u with Dr. Rockey Situ or an APP in ***.   Current medicines are reviewed at length with the patient today.  The patient did not have any concerns regarding medicines.  Signed, Christell Faith, PA-C 12/04/2018 1:31 PM     [address] [place], [place] [address] [phone]

## 2018-12-04 NOTE — Telephone Encounter (Signed)
Results called to pt. Pt verbalized understanding. [facility] at his earliest convenience. Suggested he could do on the same day as the echo. He is aware to increase Toprol to 100 mg daily. Advised patient I will route to scheduling to reach out and schedule echo.

## 2018-12-06 ENCOUNTER — Ambulatory Visit: Payer: PPO | Admitting: Physician Assistant

## 2018-12-06 DIAGNOSIS — J449 Chronic obstructive pulmonary disease, unspecified: Secondary | ICD-10-CM | POA: Diagnosis not present

## 2018-12-14 ENCOUNTER — Other Ambulatory Visit: Payer: Self-pay | Admitting: Cardiovascular Disease

## 2018-12-18 ENCOUNTER — Telehealth: Payer: Self-pay

## 2018-12-18 NOTE — Telephone Encounter (Signed)

## 2018-12-19 ENCOUNTER — Other Ambulatory Visit: Payer: Self-pay

## 2018-12-19 ENCOUNTER — Ambulatory Visit (INDEPENDENT_AMBULATORY_CARE_PROVIDER_SITE_OTHER): Payer: PPO

## 2018-12-19 DIAGNOSIS — G8929 Other chronic pain: Secondary | ICD-10-CM | POA: Diagnosis not present

## 2018-12-19 DIAGNOSIS — R0602 Shortness of breath: Secondary | ICD-10-CM | POA: Diagnosis not present

## 2018-12-19 DIAGNOSIS — R079 Chest pain, unspecified: Secondary | ICD-10-CM

## 2018-12-19 MED ORDER — PERFLUTREN LIPID MICROSPHERE
1.0000 mL | INTRAVENOUS | Status: AC | PRN
  Administered 2018-12-19: 2 mL via INTRAVENOUS

## 2018-12-21 NOTE — Progress Notes (Deleted)
Cardiology Office Note Date:  12/21/2018  Patient ID:  William Mueller, DOB 11/03/1950, MRN [id number] PCP:  Philmore Pali, NP  Cardiologist:  Dr. Rockey Situ, MD  ***refresh   Chief Complaint: Follow up  History of Present Illness: William Mueller is a 68 y.o. male with history of CAD s/p PCI to the RCA in 12/2013 with ongoing chronic chest pain s/p multiple repeat caths as outlined below, CKD stage III, COPD secondary to long history of tobacco abuse with E-cigarette use on home oxygen, HFpEF, paroxysmal atrial tachycardia, labile hypertension with orthostatic hypotension, depression, anxiety, HTN, HLD, and GERD who presents for follow up of ***.  Admitted to [facility] in 12/2013 with chest pain. Underwent LHC on/01/2014 that showed left main normal, mid LAD diffuse 20%, ostial D1 50%, prox D1 50%, D2 30%, mid LCx moderately calcified with 30%, RCA mildly calcified with diffuse 30% proximally. The vessel was occluded in the midsegment. Faint left-to-right collaterals were noted. He underwent successful PCI/DES to the RCA. Echo showed EF 55-60%, mild LVH, GR1DD, inferior HK, elevated CVP, dilated IVC suggestive of elevated RA pressure.Admitted again in 02/2014 with chest pain, ruled out, left AMA. Admitted 02/2015 with chest pain. Ruled out. Underwent LHC that showed patent RCA stent with proximal RCA 55%, d1 80%, mid LCx to distal LCx 90%. Admitted 09/2015 with chest pain, ruled out, LHC showed patent RCA stent with unchanged nonobstructive disease when compared to prior LHC. FFR of RCA lesion not significant at 0.93. Medical management was advised. Echo in 09/2017 showed an EF of 65-70%, normal wall motion, Gr2DD. Most recently, he was admitted in 09/2018 with recurrent chest pain. Troponin minimally elevated peaking at 0.04. He underwent repeat LHC that showed left main without significant disease, D1 80% (small to moderate size vessel), proximal LCx 30%, mid to distal LCx 90% (small vessel), proximal RCA 30% prior to  the previously placed stent in the proximal to mid region without significant ISR. LVEF 55%. Medical management was advised. He was started on Ranexa 500 mg bid. In telehealth follow up he was feeling well. He stated "cathereization fixed me." He was reminded no intervention was performed and he was started on Ranexa. Following this, the patient stopped Ranexa on his own stating his chest pain was improved. He also noted a jittery feeling with this medication and possible difficulty in emptying his bladder.  He was diagnosed with COPD exacerbation in the spring of 2020 that improved with prolonged steroid course. He was most recently seen in our office in 10/2018 noting significant improvement in his SOB following treatment for COPD exacerbation. He did note some palpitations with ambulation. Labs were recommended for further evaluation, though not completed. In this setting, he wore a Zio patch in 11/2018 that showed NSR with an average heart rate of 104 bpm, 2 SVT runs occurred with the fastest interval lasting 6 beats with a maximum rate of 152 bpm and the longest interval lasting 8 beats with an average rate of 111 bpm, isolated PACS, atrial couplets, triplets, PVCs, and ventricular couplets were rare. Ventricular bigeminy was noted. His Toprol was increased to 100 mg daily. Follow up echo on 12/19/2018 showed an EF of 32-95%, diastolic dysfunction, no RWMA, normal RVSF, valves were not well visualized. Since he was last seen by [place], he has established with a new pulmonologist.   ***  Labs: 09/2018 - WBC 9.7, HGB 12.0, SCr 1.68 (baseline 1.5-1.6), K+ 3.5, LDL 52, TSH normal, A1c 5.7, magnesium 1.8  Past Medical History:  Diagnosis Date  . Anginal pain ([place]) 06/2017  . Anxiety   . Bronchitis 06/2017  . Chronic chest pain   . Chronic kidney disease (CKD) stage G3b/A1, moderately decreased glomerular filtration rate (GFR) between 30-44 mL/min/1.73 square meter and albuminuria creatinine ratio less  than 30 mg/g (HCC)   . Chronic lower back pain    WITH LEG WEAKNESS  . COPD (chronic obstructive pulmonary disease) (HCC)    EMPHYSEMA, O2 AT 2L PRN  . Coronary artery disease    a. 12/2013 PCI: mRCA 100% with L to R collats s/p PCI/DES;  b. 06/2014 MV: no ischemia/infarct, EF 53%;  c. 03/2015 Cath: LM nl, LAD nl, D1 80 (1.105mm), LCX 77m (<1.85mm), OM1 nl, RCA 55p, patent stent, RPDA nl, EF 65%; d. 09/2015 Cath: LM nl, LAD 76m, D1 80 (<52mm), LCX 37m/d (<1.54mm), OM1 nl, RCA 55p (FFR 0.93), patent stent, RPDA nl-->Med Rx.  . Daily headache   . Depression   . Diastolic dysfunction    a. echo 12/2013: EF 55-60%, mild LVH, GR1DD, inf HK, elevated CVP, mildly dilated IVC suggestive of increased RA pressure  . Dyspnea    WHEEZING  . GERD (gastroesophageal reflux disease)    REFLUX  . Hearing loss   . High cholesterol   . Hypertension   . Myocardial infarction ([place]) 2015  . Nicotine addiction    a. using eCigs.  . Orthopnea   . Sleep apnea     Past Surgical History:  Procedure Laterality Date  . CARDIAC CATHETERIZATION     MC x 1 stent  . CARDIAC CATHETERIZATION Left 03/12/2015   Procedure: Left Heart Cath and Coronary Angiography;  Surgeon: Leonie Man, MD;  Location: [place] [facility];  Service: Cardiovascular;  Laterality: Left;  . CARDIAC CATHETERIZATION N/A 09/29/2015   Procedure: Left Heart Cath and Coronary Angiography;  Surgeon: Wellington Hampshire, MD;  Location: [place] [facility];  Service: Cardiovascular;  Laterality: N/A;  . CATARACT EXTRACTION W/PHACO Right 08/10/2017   Procedure: CATARACT EXTRACTION PHACO AND INTRAOCULAR LENS PLACEMENT ([name]) RIGHT;  Surgeon: Leandrew Koyanagi, MD;  Location: [place];  Service: Ophthalmology;  Laterality: Right;  . CORONARY ANGIOPLASTY     STENT PLACEMENT  . LEFT HEART CATH AND CORONARY ANGIOGRAPHY N/A 09/06/2018   Procedure: LEFT HEART CATH AND CORONARY ANGIOGRAPHY;  Surgeon: Minna Merritts, MD;  Location: [place] [facility];  Service: Cardiovascular;  Laterality: N/A;  . LEFT HEART CATHETERIZATION WITH CORONARY ANGIOGRAM N/A 12/12/2013   Procedure: LEFT HEART CATHETERIZATION WITH CORONARY ANGIOGRAM;  Surgeon: Wellington Hampshire, MD;  Location: [place] [facility];  Service: Cardiovascular;  Laterality: N/A;    No outpatient medications have been marked as taking for the 12/25/18 encounter (Appointment) with Rise Mu, PA-C.    Allergies:   Paroxetine hcl, Serotonin reuptake inhibitors (ssris), Diazepam, Doxycycline monohydrate, Escitalopram oxalate, Lorazepam, and Tetracyclines & related   Social History:  The patient  reports that he quit smoking about 9 years ago. His smoking use included e-cigarettes. He quit after 100.00 years of use. He has never used smokeless tobacco. He reports current alcohol use of about 6.0 standard drinks of alcohol per week. He reports that he does not use drugs.   Family History:  The patient's family history includes CVA in his mother; Coronary artery disease in his brother and father; Heart disease in his father; Heart disease (age of onset: 9) in his brother.  ROS:   ROS  PHYSICAL EXAM: *** VS:  There were no vitals taken for this visit. BMI: There is no height or weight on file to calculate BMI.  Physical Exam   EKG:  Was ordered and interpreted by me today. Shows ***  Recent Labs: 05/15/2018: ALT 20 09/06/2018: Magnesium 1.8; TSH 1.212 09/07/2018: BUN 15; Creatinine, Ser 1.68; Hemoglobin 12.0; Platelets 254; Potassium 3.5; Sodium 137  09/07/2018: Cholesterol 98; HDL 31; LDL Cholesterol 52; Total CHOL/HDL Ratio 3.2; Triglycerides 75; VLDL 15   CrCl cannot be calculated (Patient's most recent lab result is older than the maximum 21 days allowed.).   Wt Readings from Last 3 Encounters:  11/07/18 194 lb 11.2 oz (88.3 kg)  11/03/18 198 lb (89.8 kg)  09/07/18 205 lb (93 kg)     Other studies reviewed: Additional studies/records reviewed today include: summarized above   ASSESSMENT AND PLAN:  1. ***  Disposition: F/u with Dr. Rockey Situ or an APP in ***.  Current medicines are reviewed at length with the patient today.  The patient did not have any concerns regarding medicines.  Signed, Christell Faith, PA-C 12/21/2018 1:07 PM     [address] [place], [place] [address] [phone]

## 2018-12-22 ENCOUNTER — Telehealth: Payer: Self-pay | Admitting: Cardiovascular Disease

## 2018-12-22 NOTE — Telephone Encounter (Signed)

## 2018-12-25 ENCOUNTER — Ambulatory Visit: Payer: PPO | Admitting: Physician Assistant

## 2019-01-02 NOTE — Progress Notes (Signed)
Cardiology Office Note    Date:  01/04/2019   ID:  William Mueller, DOB [birth year], MRN [id number]  PCP:  Philmore Pali, NP  Cardiologist:  Ida Rogue, MD  Electrophysiologist:  None   Chief Complaint: Follow up  History of Present Illness:   William Mueller is a 68 y.o. male with history of CAD s/p PCI to the RCA in 12/2013 with ongoing chronic chest pain s/p multiple repeat caths as outlined below, paroxysmal SVT, CKD stage III, COPD secondary to long history of tobacco abuse with E-cigarette use on home oxygen, HFpEF, paroxysmal atrial tachycardia, labile hypertension with orthostatic hypotension, depression, anxiety, HTN, HLD, and GERD who presents for follow up of CAD.  Admitted to [facility] in 12/2013 with chest pain. Underwent LHC on/01/2014 that showed left main normal, mid LAD diffuse 20%, ostial D1 50%, prox D1 50%, D2 30%, mid LCx moderately calcified with 30%, RCA mildly calcified with diffuse 30% proximally. The vessel was occluded in the midsegment. Faint left-to-right collaterals were noted. He underwent successful PCI/DES to the RCA. Echo showed EF 55-60%, mild LVH, GR1DD, inferior HK, elevated CVP, dilated IVC suggestive of elevated RA pressure.Admitted again in 02/2014 with chest pain, ruled out, left AMA. Admitted 02/2015 with chest pain. Ruled out. Underwent LHC that showed patent RCA stent with proximal RCA 55%, d1 80%, mid LCx to distal LCx 90%. Admitted 09/2015 with chest pain, ruled out, LHC showed patent RCA stent with unchanged nonobstructive disease when compared to prior LHC. FFR of RCA lesion not significant at 0.93. Medical management was advised. Echo in 09/2017 showed an EF of 65-70%, normal wall motion, Gr2DD. Most recently, he was admitted in 09/2018 with recurrent chest pain. Troponin minimally elevated peaking at 0.04. He underwent repeat LHC that showed left main without significant disease, D1 80% (small to moderate size vessel), proximal LCx 30%, mid to distal LCx 90%  (small vessel), proximal RCA 30% prior to the previously placed stent in the proximal to mid region without significant ISR. LVEF 55%. Medical management was advised. He was started on Ranexa 500 mg bid. In telehealth follow up he was feeling well. He stated "cathereization fixed me." He was reminded no intervention was performed and he was started on Ranexa. Following this, the patient stopped Ranexa on his own stating his chest pain was improved. He also noted a jittery feeling with this medication and possible difficulty in emptying his bladder.  He was diagnosed with COPD exacerbation in the spring of 2020 that improved with prolonged steroid course. He was most recently seen in our office in 10/2018 noting significant improvement in his SOB following treatment for COPD exacerbation. He did note some palpitations with ambulation. Labs were recommended for further evaluation, though not completed. In this setting, he wore a Zio patch in 11/2018 that showed NSR with an average heart rate of 104 bpm, 2 SVT runs occurred with the fastest interval lasting 6 beats with a maximum rate of 152 bpm and the longest interval lasting 8 beats with an average rate of 111 bpm, isolated PACS, atrial couplets, triplets, PVCs, and ventricular couplets were rare. Ventricular bigeminy was noted. His Toprol was increased to 100 mg daily. Follow up echo on 12/19/2018 showed an EF of 35-46%, diastolic dysfunction, no RWMA, normal RVSF, valves were not well visualized. Since he was last seen by [place], he has established with a new pulmonologist.   He comes in doing very well today.  Since establishing with a new pulmonologist  and undergoing extended steroid taper back in the spring of this year he states his breathing has been "great."  He has spent more time outdoors this summer, including in the excessive heat/admittedly, than he has in several years combined.  He states he is working on 4 L and writing them, "having a great time."  He  remains on supplemental oxygen via nasal cannula at 2 L which is his baseline.  He denies any chest pain, shortness of breath, palpitations, dizziness, presyncope, syncope.  No lower extremity swelling, abdominal exertion, orthopnea, PND, early satiety.  No falls, BRBPR, melena.  Heart rate typically remains in the 80s bpm at home.  Seems to be tolerating titrated dose of Toprol-XL without issue.  He does not have any concerns at this time.  Labs: 09/2018 - WBC 9.7, HGB 12.0, SCr 1.68 (baseline 1.5-1.6), K+ 3.5, LDL 52, TSH normal, A1c 5.7, magnesium 1.8  Past Medical History:  Diagnosis Date   Anginal pain ([place]) 06/2017   Anxiety    Bronchitis 06/2017   Chronic chest pain    Chronic kidney disease (CKD) stage G3b/A1, moderately decreased glomerular filtration rate (GFR) between 30-44 mL/min/1.73 square meter and albuminuria creatinine ratio less than 30 mg/g (HCC)    Chronic lower back pain    WITH LEG WEAKNESS   COPD (chronic obstructive pulmonary disease) (HCC)    EMPHYSEMA, O2 AT 2L PRN   Coronary artery disease    a. 12/2013 PCI: mRCA 100% with L to R collats s/p PCI/DES;  b. 06/2014 MV: no ischemia/infarct, EF 53%;  c. 03/2015 Cath: LM nl, LAD nl, D1 80 (1.81mm), LCX 28m (<1.52mm), OM1 nl, RCA 55p, patent stent, RPDA nl, EF 65%; d. 09/2015 Cath: LM nl, LAD 8m, D1 80 (<19mm), LCX 67m/d (<1.63mm), OM1 nl, RCA 55p (FFR 0.93), patent stent, RPDA nl-->Med Rx.   Daily headache    Depression    Diastolic dysfunction    a. echo 12/2013: EF 55-60%, mild LVH, GR1DD, inf HK, elevated CVP, mildly dilated IVC suggestive of increased RA pressure   Dyspnea    WHEEZING   GERD (gastroesophageal reflux disease)    REFLUX   Hearing loss    High cholesterol    Hypertension    Myocardial infarction ([place]) 2015   Nicotine addiction    a. using eCigs.   Orthopnea    Sleep apnea     Past Surgical History:  Procedure Laterality Date   CARDIAC CATHETERIZATION     MC x 1 stent    CARDIAC CATHETERIZATION Left 03/12/2015   Procedure: Left Heart Cath and Coronary Angiography;  Surgeon: Leonie Man, MD;  Location: [place] [facility];  Service: Cardiovascular;  Laterality: Left;   CARDIAC CATHETERIZATION N/A 09/29/2015   Procedure: Left Heart Cath and Coronary Angiography;  Surgeon: Wellington Hampshire, MD;  Location: [place] [facility];  Service: Cardiovascular;  Laterality: N/A;   CATARACT EXTRACTION W/PHACO Right 08/10/2017   Procedure: CATARACT EXTRACTION PHACO AND INTRAOCULAR LENS PLACEMENT ([place]) RIGHT;  Surgeon: Leandrew Koyanagi, MD;  Location: [place];  Service: Ophthalmology;  Laterality: Right;   CORONARY ANGIOPLASTY     STENT PLACEMENT   LEFT HEART CATH AND CORONARY ANGIOGRAPHY N/A 09/06/2018   Procedure: LEFT HEART CATH AND CORONARY ANGIOGRAPHY;  Surgeon: Minna Merritts, MD;  Location: [place] [facility];  Service: Cardiovascular;  Laterality: N/A;   LEFT HEART CATHETERIZATION WITH CORONARY ANGIOGRAM N/A 12/12/2013   Procedure: LEFT HEART CATHETERIZATION WITH CORONARY ANGIOGRAM;  Surgeon:  Wellington Hampshire, MD;  Location: [facility];  Service: Cardiovascular;  Laterality: N/A;    Current Medications: Current Meds  Medication Sig   acetaminophen (TYLENOL) 500 MG tablet Take 1,000 mg by mouth daily as needed for headache.    albuterol (PROVENTIL HFA;VENTOLIN HFA) 108 (90 Base) MCG/ACT inhaler Inhale into the lungs every 6 (six) hours as needed for wheezing or shortness of breath.   albuterol (PROVENTIL) (2.5 MG/3ML) 0.083% nebulizer solution Take 3 mLs (2.5 mg total) by nebulization every 6 (six) hours as needed for wheezing or shortness of breath.   ALPRAZolam (XANAX XR) 3 MG 24 hr tablet Take 3 mg by mouth 2 (two) times daily. 1 MG IN THE MORNING AND 3 MG IN THE EVENING   ALPRAZolam (XANAX) 1 MG tablet Take 1 mg by mouth daily.    aspirin 81 MG EC tablet Take 1 tablet (81 mg total) by mouth daily. (Patient taking differently: Take 81  mg by mouth daily. BREAKFAST)   atorvastatin (LIPITOR) 80 MG tablet Take 1 tablet by mouth daily   ezetimibe (ZETIA) 10 MG tablet Take 1 tablet (10 mg total) by mouth daily.   furosemide (LASIX) 20 MG tablet Take 20 mg by mouth daily.    gabapentin (NEURONTIN) 100 MG capsule Take 300 mg by mouth 2 (two) times daily. Take 2 pills twice a day    hydrALAZINE (APRESOLINE) 25 MG tablet Take 25 mg by mouth as needed.   meclizine (ANTIVERT) 25 MG tablet Take 1 tablet (25 mg total) by mouth 3 (three) times daily as needed for dizziness.   metoprolol succinate (TOPROL-XL) 100 MG 24 hr tablet Take 1 tablet (100 mg total) by mouth daily. Take with or immediately following a meal.   mometasone-formoterol (DULERA) 100-5 MCG/ACT AERO Inhale 2 puffs into the lungs 2 (two) times daily.    nitroGLYCERIN (NITROSTAT) 0.4 MG SL tablet Place 1 tablet (0.4 mg total) under the tongue every 5 (five) minutes as needed for chest pain.   OXYGEN Inhale 2 L into the lungs daily as needed (oxygen).    roflumilast (DALIRESP) 500 MCG TABS tablet Take 1 tablet (500 mcg total) by mouth daily. (Patient taking differently: Take 500 mcg by mouth daily. LUNCH)   tiotropium (SPIRIVA) 18 MCG inhalation capsule Place 1 capsule (18 mcg total) into inhaler and inhale daily. (Patient taking differently: Place 18 mcg into inhaler and inhale daily. AM)     Allergies:   Paroxetine hcl, Serotonin reuptake inhibitors (ssris), Diazepam, Doxycycline monohydrate, Escitalopram oxalate, Lorazepam, and Tetracyclines & related   Social History   Socioeconomic History   Marital status: Married    Spouse name: Not on file   Number of children: Not on file   Years of education: Not on file   Highest education level: Not on file  Occupational History   Not on file  Social Needs   Financial resource strain: Not on file   Food insecurity    Worry: Not on file    Inability: Not on file   Transportation needs    Medical: Not  on file    Non-medical: Not on file  Tobacco Use   Smoking status: Former Smoker    Years: 100.00    Types: E-cigarettes    Quit date: 06/07/2009    Years since quitting: 9.5   Smokeless tobacco: Never Used   Tobacco comment: Using electronic cigarette  Substance and Sexual Activity   Alcohol use: Yes    Alcohol/week: 6.0 standard  drinks    Types: 6 Cans of beer per week    Comment: Occasional    Drug use: No   Sexual activity: Not Currently  Lifestyle   Physical activity    Days per week: Not on file    Minutes per session: Not on file   Stress: Not on file  Relationships   Social connections    Talks on phone: Not on file    Gets together: Not on file    Attends religious service: Not on file    Active member of club or organization: Not on file    Attends meetings of clubs or organizations: Not on file    Relationship status: Not on file  Other Topics Concern   Not on file  Social History Narrative   Not on file     Family History:  The patient's family history includes CVA in his mother; Coronary artery disease in his brother and father; Heart disease in his father; Heart disease (age of onset: 17) in his brother. There is no history of Kidney disease or Prostate cancer.  ROS:   Review of Systems  Constitutional: Positive for malaise/fatigue. Negative for chills, diaphoresis, fever and weight loss.  HENT: Negative for congestion.   Eyes: Negative for discharge and redness.  Respiratory: Negative for cough, hemoptysis, sputum production, shortness of breath and wheezing.   Cardiovascular: Negative for chest pain, palpitations, orthopnea, claudication, leg swelling and PND.  Gastrointestinal: Negative for abdominal pain, blood in stool, heartburn, melena, nausea and vomiting.  Genitourinary: Negative for hematuria.  Musculoskeletal: Negative for falls and myalgias.  Skin: Negative for rash.  Neurological: Negative for dizziness, tingling, tremors, sensory  change, speech change, focal weakness, loss of consciousness and weakness.  Endo/Heme/Allergies: Does not bruise/bleed easily.  Psychiatric/Behavioral: Negative for substance abuse. The patient is not nervous/anxious.   All other systems reviewed and are negative.    EKGs/Labs/Other Studies Reviewed:    Studies reviewed were summarized above. The additional studies were reviewed today:  2D Echo 12/19/2018: 1. The left ventricle has normal systolic function with an ejection fraction of 60-65%. The cavity size was normal. There is mildly increased left ventricular wall thickness. Left ventricular diastolic Doppler parameters are consistent with impaired  relaxation. No evidence of left ventricular regional wall motion abnormalities.  2. The right ventricle has normal systolic function. The cavity was normal. There is no increase in right ventricular wall thickness. Right ventricular systolic pressure could not be assessed.  3. The aortic valve was not well visualized. __________  [facility] Reach monitor 11/2018: Event Monitor  Normal sinus rhythm Avg HR of 104 bpm.  2 Supraventricular Tachycardia runs occurred, the run with the fastest interval lasting 6 beats with a max rate of 152 bpm,  the longest lasting 8 beats with an avg rate of 111 bpm.   Isolated SVEs were rare (<1.0%), SVE Couplets were rare (<1.0%), and SVE Triplets were rare (<1.0%). Isolated VEs were rare (<1.0%), VE Couplets were rare (<1.0%), and no VE Triplets were present. Ventricular Bigeminy was present.  Patient triggered events were not associated with significant arrhythmia __________  [facility] 09/2018: Coronary angiography:  Coronary dominance: Right or codominant  Left mainstem:   Large vessel that bifurcates into the LAD and left circumflex, no significant disease noted  Left anterior descending (LAD):   Large vessel that extends to the apical region, diagonal branch 2, first diagonal branch is a small to moderate size  vessel, severe diffuse disease predominantly in the proximal  region estimated at 80%, second diagonal branch moderate to large size vessel no significant disease  Left circumflex (LCx):  Large vessel with OM branch 2, 30% mid left circumflex disease, distal left circumflex after takeoff of OM 2 is severely diseased, small vessel.  There is a large OM branch with no significant disease  Right coronary artery (RCA):  Right dominant vessel with PL and PDA, no significant disease noted.  30% proximal RCA disease prior to long stent in the proximal to mid region, stent with no significant in-stent restenosis  Left ventriculography: Left ventricular systolic function is normal, LVEF is estimated at 55%, there is no significant mitral regurgitation , no significant aortic valve stenosis  Final Conclusions:   Significant disease of a small diagonal branch and small distal left circumflex branches Mean branches of the RCA, left circumflex, LAD and large diagonal are intact with no significant disease  Recommendations:  Etiology of his chest pain could be secondary to small vessel disease Pictures reviewed by interventional cardiology Medical management recommended We will recommend he start Ranexa 500 mg twice daily for 1 week then up to 1000 mg twice daily Continue other outpatient medications __________  EKG:  EKG is ordered today.  The EKG ordered today demonstrates NSR, 77 bpm, nonspecific lateral ST-T changes (grossly unchanged from prior)  Recent Labs: 05/15/2018: ALT 20 09/06/2018: Magnesium 1.8; TSH 1.212 09/07/2018: BUN 15; Creatinine, Ser 1.68; Hemoglobin 12.0; Platelets 254; Potassium 3.5; Sodium 137  Recent Lipid Panel    Component Value Date/Time   CHOL 98 09/07/2018 0347   CHOL 156 05/28/2015 0818   TRIG 75 09/07/2018 0347   HDL 31 (L) 09/07/2018 0347   HDL 54 05/28/2015 0818   CHOLHDL 3.2 09/07/2018 0347   VLDL 15 09/07/2018 0347   LDLCALC 52 09/07/2018 0347   LDLCALC 73  05/28/2015 0818    PHYSICAL EXAM:    VS:  BP 140/82 (BP Location: Left Arm, Patient Position: Sitting, Cuff Size: Normal)    Pulse 77    Ht 5\' 9"  (1.753 m)    Wt 186 lb 12 oz (84.7 kg)    BMI 27.58 kg/m   BMI: Body mass index is 27.58 kg/m.  Physical Exam  Constitutional: He is oriented to person, place, and time. He appears well-developed and well-nourished.  HENT:  Head: Normocephalic and atraumatic.  Eyes: Right eye exhibits no discharge. Left eye exhibits no discharge.  Neck: Normal range of motion. No JVD present.  Cardiovascular: Normal rate, regular rhythm, S1 normal, S2 normal and normal heart sounds. Exam reveals no distant heart sounds, no friction rub, no midsystolic click and no opening snap.  No murmur heard. Pulmonary/Chest: Effort normal. No respiratory distress. He has decreased breath sounds. He has no wheezes. He has no rales. He exhibits no tenderness.  On supplemental oxygen at 2 L via nasal cannula.  Abdominal: Soft. He exhibits no distension. There is no abdominal tenderness.  Musculoskeletal:        General: No edema.  Neurological: He is alert and oriented to person, place, and time.  Skin: Skin is warm and dry. No cyanosis. Nails show no clubbing.  Psychiatric: He has a normal mood and affect. His speech is normal and behavior is normal. Judgment and thought content normal.    Wt Readings from Last 3 Encounters:  01/04/19 186 lb 12 oz (84.7 kg)  11/07/18 194 lb 11.2 oz (88.3 kg)  11/03/18 198 lb (89.8 kg)     ASSESSMENT & PLAN:  1. Chronic hypoxic respiratory failure secondary to COPD: Well-controlled.  Patient states this is the best he has felt in many years.  Continue current therapy as outlined by PCP and pulmonology.  2. CAD involving the native coronary arteries with stable angina: Currently chest pain-free.  Recent cath from 09/2018 demonstrating diffusely diseased small in size AV groove left circumflex measuring approximately 1 mm as well as  moderate stenosis of a small diagonal and nonobstructive disease affecting the RCA proximal to the previously placed stent.  There is no evidence of large vessel obstructive disease.  Medical management was advised.  He did note previous response to ranolazine though did not tolerate it and self discontinued this medication.  Symptoms certainly may have been exacerbated by tachycardic heart rates noted in the setting of his COPD exacerbation.  With escalation of Toprol-XL for added rate control and antianginal effect patient has responded nicely.  Continue aspirin, Toprol-XL, Lipitor, and Zetia.  If needed, moving forward could consider addition of amlodipine followed by isosorbide mononitrate.  No plans for further ischemic evaluation at this time.  3. Paroxysmal SVT: Patient was noted to have 2 brief runs of SVT on recent ZIO monitoring.  With escalation of Toprol-XL to 100 mg daily he notes resolution of palpitations.  Check BMP to evaluate renal function and potassium.  4. HFpEF: Patient appears euvolemic and well compensated.  Recent echo as outlined above.  Continue current dose of Lasix.  Check BMP.  5. CKD stage III: Check BMP.  6. Labile hypertension: Blood pressure is mildly elevated today.  Given prior issues with soft BP we will not escalate antihypertensive therapy at this time and continue current regimen.  Disposition: F/u with Dr. Rockey Situ in 6 months.   Medication Adjustments/Labs and Tests Ordered: Current medicines are reviewed at length with the patient today.  Concerns regarding medicines are outlined above. Medication changes, Labs and Tests ordered today are summarized above and listed in the Patient Instructions accessible in Encounters.   Signed, Christell Faith, PA-C 01/04/2019 11:16 AM     [address], [place] [address] [phone]

## 2019-01-03 ENCOUNTER — Telehealth: Payer: Self-pay

## 2019-01-03 NOTE — Telephone Encounter (Signed)
  QUESTIONS ANSWERED BY WIFE  COVID-19 Pre-Screening Questions:  . In the past 7 to 10 days have you had a cough, shortness of breath, headache, congestion, fever (100 or greater), body aches, chills, sore throat, or sudden loss of taste or sense of smell? NO . Have you been around anyone with known Covid 19? NO . Have you been around anyone who is awaiting Covid 19 test results in the past 7 to 10 days? NO . Have you been around anyone who has been exposed to Covid 19, or has mentioned symptoms of Covid 19 within the past 7 to 10 days? NO If you have any concerns/questions about symptoms patients report during screening (either on the phone or at threshold). Contact the provider seeing the patient or DOD for further guidance.  If neither are available contact a member of the leadership team.

## 2019-01-04 ENCOUNTER — Other Ambulatory Visit: Payer: Self-pay

## 2019-01-04 ENCOUNTER — Encounter: Payer: Self-pay | Admitting: Physician Assistant

## 2019-01-04 ENCOUNTER — Ambulatory Visit (INDEPENDENT_AMBULATORY_CARE_PROVIDER_SITE_OTHER): Payer: PPO | Admitting: Physician Assistant

## 2019-01-04 VITALS — BP 140/82 | HR 77 | Ht 69.0 in | Wt 186.8 lb

## 2019-01-04 DIAGNOSIS — I25118 Atherosclerotic heart disease of native coronary artery with other forms of angina pectoris: Secondary | ICD-10-CM

## 2019-01-04 DIAGNOSIS — Z0000 Encounter for general adult medical examination without abnormal findings: Secondary | ICD-10-CM | POA: Diagnosis not present

## 2019-01-04 DIAGNOSIS — Z1331 Encounter for screening for depression: Secondary | ICD-10-CM | POA: Diagnosis not present

## 2019-01-04 DIAGNOSIS — N183 Chronic kidney disease, stage 3 unspecified: Secondary | ICD-10-CM

## 2019-01-04 DIAGNOSIS — I5032 Chronic diastolic (congestive) heart failure: Secondary | ICD-10-CM | POA: Diagnosis not present

## 2019-01-04 DIAGNOSIS — J9611 Chronic respiratory failure with hypoxia: Secondary | ICD-10-CM

## 2019-01-04 DIAGNOSIS — Z9181 History of falling: Secondary | ICD-10-CM | POA: Diagnosis not present

## 2019-01-04 DIAGNOSIS — Z6827 Body mass index (BMI) 27.0-27.9, adult: Secondary | ICD-10-CM | POA: Diagnosis not present

## 2019-01-04 DIAGNOSIS — I471 Supraventricular tachycardia: Secondary | ICD-10-CM | POA: Diagnosis not present

## 2019-01-04 DIAGNOSIS — Z1211 Encounter for screening for malignant neoplasm of colon: Secondary | ICD-10-CM | POA: Diagnosis not present

## 2019-01-04 DIAGNOSIS — I25110 Atherosclerotic heart disease of native coronary artery with unstable angina pectoris: Secondary | ICD-10-CM

## 2019-01-04 DIAGNOSIS — Z125 Encounter for screening for malignant neoplasm of prostate: Secondary | ICD-10-CM | POA: Diagnosis not present

## 2019-01-04 DIAGNOSIS — I10 Essential (primary) hypertension: Secondary | ICD-10-CM

## 2019-01-04 DIAGNOSIS — E785 Hyperlipidemia, unspecified: Secondary | ICD-10-CM | POA: Diagnosis not present

## 2019-01-04 NOTE — Patient Instructions (Signed)
Medication Instructions:  Your physician recommends that you continue on your current medications as directed. Please refer to the Current Medication list given to you today.  If you need a refill on your cardiac medications before your next appointment, please call your pharmacy.   Lab work: Art gallery manager today If you have labs (blood work) drawn today and your tests are completely normal, you will receive your results only by: Marland Kitchen MyChart Message (if you have MyChart) OR . A paper copy in the mail If you have any lab test that is abnormal or we need to change your treatment, we will call you to review the results.  Testing/Procedures: None ordered  Follow-Up: At [facility], you and your health needs are our priority.  As part of our continuing mission to provide you with exceptional heart care, we have created designated Provider Care Teams.  These Care Teams include your primary Cardiologist (physician) and Advanced Practice Providers (APPs -  Physician Assistants and Nurse Practitioners) who all work together to provide you with the care you need, when you need it. You will need a follow up appointment in 6 months.  Please call our office 2 months in advance to schedule this appointment.  You may see Ida Rogue, MD or one of the following Advanced Practice Providers on your designated Care Team:   Murray Hodgkins, NP Christell Faith, PA-C . Marrianne Mood, PA-C

## 2019-01-05 LAB — BASIC METABOLIC PANEL
BUN/Creatinine Ratio: 10 (ref 10–24)
BUN: 16 mg/dL (ref 8–27)
CO2: 28 mmol/L (ref 20–29)
Calcium: 9.7 mg/dL (ref 8.6–10.2)
Chloride: 96 mmol/L (ref 96–106)
Creatinine, Ser: 1.55 mg/dL — ABNORMAL HIGH (ref 0.76–1.27)
GFR calc Af Amer: 52 mL/min/{1.73_m2} — ABNORMAL LOW (ref >59)
GFR calc non Af Amer: 45 mL/min/{1.73_m2} — ABNORMAL LOW (ref >59)
Glucose: 91 mg/dL (ref 65–99)
Potassium: 3.7 mmol/L (ref 3.5–5.2)
Sodium: 141 mmol/L (ref 134–144)

## 2019-01-06 DIAGNOSIS — J449 Chronic obstructive pulmonary disease, unspecified: Secondary | ICD-10-CM | POA: Diagnosis not present

## 2019-01-26 ENCOUNTER — Telehealth: Payer: Self-pay | Admitting: Pulmonary Disease

## 2019-01-26 NOTE — Telephone Encounter (Signed)
Called patient for COVID-19 pre-screening for in office visit. ° °Have you recently traveled any where out of the local area in the last 2 weeks? No ° °Have you been in close contact with a person diagnosed with COVID-19 or someone awaiting results within the last 2 weeks? No ° °Do you currently have any of the following symptoms? If so, when did they start? °Cough     Diarrhea   Joint Pain °Fever      Muscle Pain   Red eyes °Shortness of breath   Abdominal pain  Vomiting °Loss of smell    Rash    Sore Throat °Headache    Weakness   Bruising or bleeding ° ° °Okay to proceed with visit 01/29/2019   ° ° °

## 2019-01-29 ENCOUNTER — Other Ambulatory Visit: Payer: Self-pay

## 2019-01-29 ENCOUNTER — Encounter: Payer: Self-pay | Admitting: Pulmonary Disease

## 2019-01-29 ENCOUNTER — Ambulatory Visit (INDEPENDENT_AMBULATORY_CARE_PROVIDER_SITE_OTHER): Payer: PPO | Admitting: Pulmonary Disease

## 2019-01-29 VITALS — BP 128/82 | HR 91 | Temp 97.9°F | Ht 69.0 in | Wt 188.0 lb

## 2019-01-29 DIAGNOSIS — J9611 Chronic respiratory failure with hypoxia: Secondary | ICD-10-CM | POA: Diagnosis not present

## 2019-01-29 DIAGNOSIS — J449 Chronic obstructive pulmonary disease, unspecified: Secondary | ICD-10-CM

## 2019-01-29 NOTE — Progress Notes (Signed)
PULMONARY OFFICE FOLLOW-UP NOTE  Requesting MD/Service: Self Date of initial consultation: 11/07/18 Reason for consultation: Very severe COPD, former smoker, chronic hypoxemic respiratory failure  PT PROFILE: 68 y.o. male former smoker (approx 100 p-y history, quit 2011, still vaping occasionally previously seen by Dr Ashby Dawes and Dr Raul Del  DATA: 09/29/17 CTA chest: no PE. No significant emphysema 12/23/15 PFTs: FVC: 2.10 > 2.33  L (47>53 %pred), FEV1: 0.91 >1.04 L (27% >31 %pred), FEV1/FVC: 43%, TLC:  L ( %pred), DLCO 46 %pred    INTERVAL: Initial consultation 11/07/2018.  At that time continued on oxygen, Dulera, Spiriva, Daliresp, albuterol as needed.  At that time, recommended discontinuation of vaping.  SUBJ: This is a scheduled follow-up.  He has no new complaints.  He states that he is "better" and doing "good".  He attributes this to getting outdoors more now.  He is still vaping occasionally throughout the day.  He has made no concerted effort to quit.  He denies fever, purulent sputum, hemoptysis, chest pain, lower extremity edema, calf tenderness.   Vitals:   01/29/19 1042  BP: 128/82  Pulse: 91  Temp: 97.9 F (36.6 C)  TempSrc: Temporal  SpO2: 95%  Weight: 188 lb (85.3 kg)  Height: 5\' 9"  (1.753 m)  2 LPM (pulse)   EXAM:  No distress at rest HEENT WNL No JVD Hyperresonant to percussion, breath sounds markedly diminished, no wheezes or other adventitious sounds Regular, no M NABS, NT Multiple ecchymoses on BUE No focal neurologic deficits  DATA:   BMP Latest Ref Rng & Units 01/04/2019 09/07/2018 09/06/2018  Glucose 65 - 99 mg/dL 91 97 103(H)  BUN 8 - 27 mg/dL 16 15 14   Creatinine 0.76 - 1.27 mg/dL 1.55(H) 1.68(H) 1.61(H)  BUN/Creat Ratio 10 - 24 10 - -  Sodium 134 - 144 mmol/L 141 137 138  Potassium 3.5 - 5.2 mmol/L 3.7 3.5 3.2(L)  Chloride 96 - 106 mmol/L 96 102 99  CO2 20 - 29 mmol/L 28 27 29   Calcium 8.6 - 10.2 mg/dL 9.7 8.5(L) 8.5(L)    CBC  Latest Ref Rng & Units 09/07/2018 09/06/2018 06/08/2018  WBC 4.0 - 10.5 K/uL 9.7 9.9 11.7(H)  Hemoglobin 13.0 - 17.0 g/dL 12.0(L) 10.5(L) 12.9(L)  Hematocrit 39.0 - 52.0 % 37.3(L) 32.0(L) 40.9  Platelets 150 - 400 K/uL 254 207 297    CXR:  No new film  I have personally reviewed all chest radiographs reported above including CXRs and CT chest unless otherwise indicated  IMPRESSION:     ICD-10-CM   1. Chronic hypoxemic respiratory failure (HCC)  J96.11   2. COPD, severe ([place])  J44.9   3.  Nicotine addiction - vaping  PLAN:  Continue oxygen therapy as close to 24 hours/day as possible Continue Dulera, Spiriva and Daliresp as previously prescribed Continue albuterol as needed for increased shortness of breath, wheezing, chest tightness, cough Encouraged again to quit vaping Flu vaccine encouraged Follow-up in 6 months with Dr. Mortimer Fries.  Call sooner if needed    Merton Border, MD PCCM service Mobile [phone] Pager [phone] 01/29/2019 11:07 AM

## 2019-01-29 NOTE — Patient Instructions (Signed)
Continue oxygen therapy as close to 24 hours/day as possible Continue Dulera, Spiriva and Daliresp as previously prescribed Continue albuterol as needed for increased shortness of breath, wheezing, chest tightness, cough Encouraged again to quit vaping Flu vaccine encouraged Follow-up in 6 months with Dr. Mortimer Fries.  Call sooner if needed

## 2019-02-05 ENCOUNTER — Ambulatory Visit: Payer: PPO | Admitting: Pulmonary Disease

## 2019-02-06 DIAGNOSIS — J449 Chronic obstructive pulmonary disease, unspecified: Secondary | ICD-10-CM | POA: Diagnosis not present

## 2019-03-08 DIAGNOSIS — J449 Chronic obstructive pulmonary disease, unspecified: Secondary | ICD-10-CM | POA: Diagnosis not present

## 2019-03-13 ENCOUNTER — Other Ambulatory Visit: Payer: Self-pay | Admitting: Cardiovascular Disease

## 2019-04-08 DIAGNOSIS — J449 Chronic obstructive pulmonary disease, unspecified: Secondary | ICD-10-CM | POA: Diagnosis not present

## 2019-05-08 DIAGNOSIS — J449 Chronic obstructive pulmonary disease, unspecified: Secondary | ICD-10-CM | POA: Diagnosis not present

## 2019-05-22 DIAGNOSIS — Z23 Encounter for immunization: Secondary | ICD-10-CM | POA: Diagnosis not present

## 2019-05-22 DIAGNOSIS — J449 Chronic obstructive pulmonary disease, unspecified: Secondary | ICD-10-CM | POA: Diagnosis not present

## 2019-05-22 DIAGNOSIS — F322 Major depressive disorder, single episode, severe without psychotic features: Secondary | ICD-10-CM | POA: Diagnosis not present

## 2019-05-22 DIAGNOSIS — Z79899 Other long term (current) drug therapy: Secondary | ICD-10-CM | POA: Diagnosis not present

## 2019-05-22 DIAGNOSIS — Z6826 Body mass index (BMI) 26.0-26.9, adult: Secondary | ICD-10-CM | POA: Diagnosis not present

## 2019-05-22 DIAGNOSIS — F419 Anxiety disorder, unspecified: Secondary | ICD-10-CM | POA: Diagnosis not present

## 2019-05-22 DIAGNOSIS — N183 Chronic kidney disease, stage 3 unspecified: Secondary | ICD-10-CM | POA: Diagnosis not present

## 2019-05-24 ENCOUNTER — Other Ambulatory Visit: Payer: Self-pay

## 2019-05-24 ENCOUNTER — Encounter: Payer: Self-pay | Admitting: Pulmonary Disease

## 2019-05-24 ENCOUNTER — Ambulatory Visit (INDEPENDENT_AMBULATORY_CARE_PROVIDER_SITE_OTHER): Payer: PPO | Admitting: Pulmonary Disease

## 2019-05-24 ENCOUNTER — Telehealth: Payer: Self-pay | Admitting: Pulmonary Disease

## 2019-05-24 VITALS — BP 126/78 | HR 104 | Temp 97.7°F | Ht 69.0 in | Wt 187.0 lb

## 2019-05-24 DIAGNOSIS — J449 Chronic obstructive pulmonary disease, unspecified: Secondary | ICD-10-CM

## 2019-05-24 DIAGNOSIS — J9611 Chronic respiratory failure with hypoxia: Secondary | ICD-10-CM

## 2019-05-24 DIAGNOSIS — F17299 Nicotine dependence, other tobacco product, with unspecified nicotine-induced disorders: Secondary | ICD-10-CM

## 2019-05-24 MED ORDER — TRELEGY ELLIPTA 100-62.5-25 MCG/INH IN AEPB: 1.0000 | INHALATION_SPRAY | Freq: Every day | RESPIRATORY_TRACT | 0 refills | Status: AC

## 2019-05-24 MED ORDER — ALBUTEROL SULFATE (2.5 MG/3ML) 0.083% IN NEBU: 2.5000 mg | INHALATION_SOLUTION | Freq: Four times a day (QID) | RESPIRATORY_TRACT | 5 refills | Status: DC | PRN

## 2019-05-24 MED ORDER — TRELEGY ELLIPTA 100-62.5-25 MCG/INH IN AEPB: 1.0000 | INHALATION_SPRAY | Freq: Every day | RESPIRATORY_TRACT | 6 refills | Status: DC

## 2019-05-24 NOTE — Telephone Encounter (Signed)
Spoke to pt's spouse, [name]) who is requesting refill on albuterol neb solution. Per LG verbally-okay to order.  Rx has been sent to preferred pharmacy. Nothing further is needed.

## 2019-05-24 NOTE — Patient Instructions (Signed)
1.  We are going to try you on Trelegy Ellipta 1 inhalation daily.  2.  You are going to discontinue Dulera and Spiriva  3.  We will see him in follow-up in 3 months time call sooner should any new difficulties arise.

## 2019-05-24 NOTE — Progress Notes (Signed)
Subjective:    Patient ID: William Mueller, male    DOB: 10/31/1950, 68 y.o.   MRN: [id number] Requesting MD/Service:Self Date of initial consultation:11/07/18 by Dr. Merton Border Reason for consultation:Very severe COPD, former smoker, chronic hypoxemic respiratory failure  PT PROFILE: 69y.o.maleformer smoker (approx 100 p-y history, quit 2011), still vaping occasionally previously seen by Dr Ashby Dawes, Dr Raul Del and Dr. Alva Garnet.    I am assuming care after Dr. Alva Garnet' departure   DATA: 09/29/17 CTA chest: no PE. No significant emphysema 12/23/15 PFTs: FVC: 2.10 >2.33 L (47>53 %pred), FEV1: 0.91 >1.04 L (27% >31 %pred), FEV1/FVC: 43%, TLC: L ( %pred), DLCO 46 %pred  INTERVAL: Last seen by Dr. Alva Garnet 01/29/2019 prior to his departure.  No new complaint since then.  HPI 68 year old male, former smoker (quit 2011, still using vape) who presents for follow-up of very severe chronic obstructive pulmonary disease and chronic respiratory failure with hypoxemia on 2 L/min continuous O2.  Continues to have dyspnea at baseline.  And Spiriva as well as Daliresp.  No reported fevers, chills or sweats.  No increase in baseline cough usually in the mornings.  Productive whitish to grayish sputum.  No hemoptysis.  No orthopnea, no paroxysmal nocturnal dyspnea.  Gets lower extremity edema towards the end of the day.  Patient reports he still vaping.   Review of Systems A 10 point review of systems was performed and it is as noted above otherwise negative.  Current medications reviewed with the patient.  Allergies  Allergen Reactions  . Paroxetine Hcl Shortness Of Breath and Palpitations  . Serotonin Reuptake Inhibitors (Ssris) Shortness Of Breath and Palpitations  . Diazepam Other (See Comments)    Rapid heartrate  . Doxycycline Monohydrate Other (See Comments)    Unknown allergic reaction  . Escitalopram Oxalate Other (See Comments)    serotonin Syndrome  . Lorazepam Other (See  Comments)    Unknown allergic reaction  . Tetracyclines & Related Itching and Rash   Social History   Tobacco Use  . Smoking status: Former Smoker    Years: 100.00    Types: E-cigarettes    Quit date: 06/07/2009    Years since quitting: 10.6  . Smokeless tobacco: Never Used  . Tobacco comment: Using electronic cigarette  Substance Use Topics  . Alcohol use: Yes    Alcohol/week: 6.0 standard drinks    Types: 6 Cans of beer per week    Comment: Occasional         Objective:   Physical Exam BP 126/78 (BP Location: Left Arm, Cuff Size: Normal)   Pulse (!) 104   Temp 97.7 F (36.5 C) (Temporal)   Ht 5\' 9"  (1.753 m)   Wt 187 lb (84.8 kg)   SpO2 97%   BMI 27.62 kg/m  GENERAL: Well-developed well-nourished, presents in transport chair with portable oxygen concentrator. Chronic use of accessories.  No conversational dyspnea. HEAD: Normocephalic, atraumatic.  EYES: Pupils equal, round, reactive to light.  No scleral icterus.  MOUTH: Nose/mouth/throat not examined due to masking requirements for COVID 19.   NECK: Supple. No thyromegaly. Trachea midline. No JVD.  No adenopathy. PULMONARY: Distant breath sounds, coarse, no other adventitious sounds. CARDIOVASCULAR: S1 and S2. Regular rate and rhythm.  Distant heart tones, no rubs murmurs or gallops heard. GASTROINTESTINAL: Protuberant abdomen, soft. MUSCULOSKELETAL: No joint deformity, no clubbing, no edema.  NEUROLOGIC: No focal deficits noted.  Speech is fluent. SKIN: Intact,warm,dry.  At that exam no rashes. PSYCH: Behavior normal.  Assessment & Plan:     ICD-10-CM   1. COPD, very severe ([place])  J44.9    We will discontinue Dulera and Spiriva Trial of Trelegy Ellipta 100/25, 1 inhalation daily Follow-up 3 months time call sooner should any new problems arise  2. Chronic hypoxemic respiratory failure (HCC)  J96.11    Continue oxygen at 2 L/min  3. Nicotine addiction, vaping  F17.299    Counseled with regards to  discontinuation of vape use Counseled on the effects of vaping in the lung.   Meds ordered this encounter  Medications  . DISCONTD: Fluticasone-Umeclidin-Vilant (TRELEGY ELLIPTA) 100-62.5-25 MCG/INH AEPB    Sig: Inhale 1 puff into the lungs daily.    Dispense:  60 each    Refill:  6  . Fluticasone-Umeclidin-Vilant (TRELEGY ELLIPTA) 100-62.5-25 MCG/INH AEPB    Sig: Inhale 1 puff into the lungs daily for 1 day.    Dispense:  14 each    Refill:  0    Order Specific Question:   Lot Number?    Answer:   [id number]    Order Specific Question:   Expiration Date?    Answer:   08/05/2020    Order Specific Question:   Manufacturer?    Answer:   [employer] [[id number]]    Order Specific Question:   Quantity    Answer:   1   C. Derrill Kay, MD [place] [facility]   *This note was dictated using voice recognition software/Dragon.  Despite best efforts to proofread, errors can occur which can change the meaning.  Any change was purely unintentional.

## 2019-06-07 ENCOUNTER — Other Ambulatory Visit: Payer: Self-pay | Admitting: Nurse Practitioner

## 2019-06-07 ENCOUNTER — Other Ambulatory Visit: Payer: Self-pay

## 2019-06-07 ENCOUNTER — Ambulatory Visit
Admission: RE | Admit: 2019-06-07 | Discharge: 2019-06-07 | Disposition: A | Discharge status: Home / Self Care | Payer: PPO | Source: Ambulatory Visit | Attending: Nurse Practitioner | Admitting: Nurse Practitioner

## 2019-06-07 DIAGNOSIS — R1011 Right upper quadrant pain: Secondary | ICD-10-CM | POA: Diagnosis not present

## 2019-06-07 DIAGNOSIS — R109 Unspecified abdominal pain: Secondary | ICD-10-CM | POA: Diagnosis not present

## 2019-06-07 DIAGNOSIS — Z6826 Body mass index (BMI) 26.0-26.9, adult: Secondary | ICD-10-CM | POA: Diagnosis not present

## 2019-06-08 DIAGNOSIS — J449 Chronic obstructive pulmonary disease, unspecified: Secondary | ICD-10-CM | POA: Diagnosis not present

## 2019-07-03 ENCOUNTER — Other Ambulatory Visit: Payer: Self-pay

## 2019-07-03 MED ORDER — EZETIMIBE 10 MG PO TABS: 10.0000 mg | ORAL_TABLET | Freq: Every day | ORAL | 0 refills | Status: DC

## 2019-07-03 NOTE — Telephone Encounter (Signed)
*  STAT* If patient is at the pharmacy, call can be transferred to refill team.   1. Which medications need to be refilled? (please list name of each medication and dose if known) Zetia  2. Which pharmacy/location (including street and city if local pharmacy) is medication to be sent to? [place]  3. Do they need a 30 day or 90 day supply? [place]

## 2019-07-09 DIAGNOSIS — J449 Chronic obstructive pulmonary disease, unspecified: Secondary | ICD-10-CM | POA: Diagnosis not present

## 2019-07-19 DIAGNOSIS — I2510 Atherosclerotic heart disease of native coronary artery without angina pectoris: Secondary | ICD-10-CM | POA: Diagnosis not present

## 2019-07-19 DIAGNOSIS — K828 Other specified diseases of gallbladder: Secondary | ICD-10-CM | POA: Diagnosis not present

## 2019-07-19 DIAGNOSIS — J449 Chronic obstructive pulmonary disease, unspecified: Secondary | ICD-10-CM | POA: Diagnosis not present

## 2019-07-19 DIAGNOSIS — Z86010 Personal history of colonic polyps: Secondary | ICD-10-CM | POA: Diagnosis not present

## 2019-07-19 DIAGNOSIS — G8929 Other chronic pain: Secondary | ICD-10-CM | POA: Diagnosis not present

## 2019-07-19 DIAGNOSIS — Z9981 Dependence on supplemental oxygen: Secondary | ICD-10-CM | POA: Diagnosis not present

## 2019-07-19 DIAGNOSIS — K219 Gastro-esophageal reflux disease without esophagitis: Secondary | ICD-10-CM | POA: Diagnosis not present

## 2019-07-19 DIAGNOSIS — Z720 Tobacco use: Secondary | ICD-10-CM | POA: Diagnosis not present

## 2019-07-19 DIAGNOSIS — R1011 Right upper quadrant pain: Secondary | ICD-10-CM | POA: Diagnosis not present

## 2019-07-23 ENCOUNTER — Ambulatory Visit (INDEPENDENT_AMBULATORY_CARE_PROVIDER_SITE_OTHER): Payer: PPO | Admitting: Family

## 2019-07-23 ENCOUNTER — Encounter: Payer: Self-pay | Admitting: Family

## 2019-07-23 ENCOUNTER — Other Ambulatory Visit: Payer: Self-pay

## 2019-07-23 VITALS — BP 148/76 | HR 74 | Ht 69.0 in | Wt 180.2 lb

## 2019-07-23 DIAGNOSIS — I5032 Chronic diastolic (congestive) heart failure: Secondary | ICD-10-CM

## 2019-07-23 DIAGNOSIS — I25118 Atherosclerotic heart disease of native coronary artery with other forms of angina pectoris: Secondary | ICD-10-CM | POA: Diagnosis not present

## 2019-07-23 DIAGNOSIS — I10 Essential (primary) hypertension: Secondary | ICD-10-CM | POA: Diagnosis not present

## 2019-07-23 DIAGNOSIS — I471 Supraventricular tachycardia: Secondary | ICD-10-CM

## 2019-07-23 DIAGNOSIS — E785 Hyperlipidemia, unspecified: Secondary | ICD-10-CM | POA: Diagnosis not present

## 2019-07-23 DIAGNOSIS — N183 Chronic kidney disease, stage 3 unspecified: Secondary | ICD-10-CM

## 2019-07-23 MED ORDER — ATORVASTATIN CALCIUM 80 MG PO TABS: 80.0000 mg | ORAL_TABLET | Freq: Every day | ORAL | 3 refills | Status: DC

## 2019-07-23 MED ORDER — METOPROLOL SUCCINATE ER 100 MG PO TB24: 100.0000 mg | ORAL_TABLET | Freq: Every day | ORAL | 1 refills | Status: DC

## 2019-07-23 MED ORDER — EZETIMIBE 10 MG PO TABS: 10.0000 mg | ORAL_TABLET | Freq: Every day | ORAL | 3 refills | Status: DC

## 2019-07-23 NOTE — Progress Notes (Signed)
Office Visit    Patient Name: William Mueller Date of Encounter: 07/23/2019  Primary Care Provider:  Philmore Pali, NP Primary Cardiologist:  Ida Rogue, MD Electrophysiologist:  None   Chief Complaint    William Mueller is a 69 y.o. male with a hx of CAD s/p PCI to RCA 12/2013 with ongoing chronic chest pain s/p multiple cardiac cath, paroxysmal SVT, CKD 3, COPD and home O2, HFrEF, paroxysmal atrial tachycardia, labile HTN with orthostatic hypotension, depression, anxiety, HLD, GERD presents today for follow up of CAD   Past Medical History    Past Medical History:  Diagnosis Date  . Anginal pain ([place]) 06/2017  . Anxiety   . Bronchitis 06/2017  . Chronic chest pain   . Chronic kidney disease (CKD) stage G3b/A1, moderately decreased glomerular filtration rate (GFR) between 30-44 mL/min/1.73 square meter and albuminuria creatinine ratio less than 30 mg/g   . Chronic lower back pain    WITH LEG WEAKNESS  . COPD (chronic obstructive pulmonary disease) (HCC)    EMPHYSEMA, O2 AT 2L PRN  . Coronary artery disease    a. 12/2013 PCI: mRCA 100% with L to R collats s/p PCI/DES;  b. 06/2014 MV: no ischemia/infarct, EF 53%;  c. 03/2015 Cath: LM nl, LAD nl, D1 80 (1.106mm), LCX 64m (<1.20mm), OM1 nl, RCA 55p, patent stent, RPDA nl, EF 65%; d. 09/2015 Cath: LM nl, LAD 80m, D1 80 (<43mm), LCX 75m/d (<1.56mm), OM1 nl, RCA 55p (FFR 0.93), patent stent, RPDA nl-->Med Rx.  . Daily headache   . Depression   . Diastolic dysfunction    a. echo 12/2013: EF 55-60%, mild LVH, GR1DD, inf HK, elevated CVP, mildly dilated IVC suggestive of increased RA pressure  . Dyspnea    WHEEZING  . GERD (gastroesophageal reflux disease)    REFLUX  . Hearing loss   . High cholesterol   . Hypertension   . Myocardial infarction ([place]) 2015  . Nicotine addiction    a. using eCigs.  . Orthopnea   . Sleep apnea    Past Surgical History:  Procedure Laterality Date  . CARDIAC CATHETERIZATION     MC x 1 stent  . CARDIAC  CATHETERIZATION Left 03/12/2015   Procedure: Left Heart Cath and Coronary Angiography;  Surgeon: Leonie Man, MD;  Location: [place] [facility];  Service: Cardiovascular;  Laterality: Left;  . CARDIAC CATHETERIZATION N/A 09/29/2015   Procedure: Left Heart Cath and Coronary Angiography;  Surgeon: Wellington Hampshire, MD;  Location: [place] [facility];  Service: Cardiovascular;  Laterality: N/A;  . CATARACT EXTRACTION W/PHACO Right 08/10/2017   Procedure: CATARACT EXTRACTION PHACO AND INTRAOCULAR LENS PLACEMENT ([place]) RIGHT;  Surgeon: Leandrew Koyanagi, MD;  Location: [place];  Service: Ophthalmology;  Laterality: Right;  . CORONARY ANGIOPLASTY     STENT PLACEMENT  . LEFT HEART CATH AND CORONARY ANGIOGRAPHY N/A 09/06/2018   Procedure: LEFT HEART CATH AND CORONARY ANGIOGRAPHY;  Surgeon: Minna Merritts, MD;  Location: [place] [facility];  Service: Cardiovascular;  Laterality: N/A;  . LEFT HEART CATHETERIZATION WITH CORONARY ANGIOGRAM N/A 12/12/2013   Procedure: LEFT HEART CATHETERIZATION WITH CORONARY ANGIOGRAM;  Surgeon: Wellington Hampshire, MD;  Location: [place] [facility];  Service: Cardiovascular;  Laterality: N/A;   Allergies  Allergies  Allergen Reactions  . Paroxetine Hcl Shortness Of Breath and Palpitations  . Serotonin Reuptake Inhibitors (Ssris) Shortness Of Breath and Palpitations  . Diazepam Other (See Comments)    Rapid heartrate  .  Doxycycline Monohydrate Other (See Comments)    Unknown allergic reaction  . Escitalopram Oxalate Other (See Comments)    serotonin Syndrome  . Lorazepam Other (See Comments)    Unknown allergic reaction  . Tetracyclines & Related Itching and Rash    History of Present Illness    William Mueller is a 69 y.o. male with a hx of CAD s/p PCI to RCA 12/2013 with ongoing chronic chest pain s/p multiple cardiac cath, paroxysmal SVT, CKD 3, COPD and home O2, HFrEF, paroxysmal atrial tachycardia, labile HTN with orthostatic hypotension, depression,  anxiety, HLD, GERD.  He was last seen 01/04/2019 by Christell Faith, PA.  Admitted to [facility] consumption of 2015 with chest pain.  Underwent  left heart cath with normal LM, mid LAD diffuse 20%, ostial D1 50%, proximal D1 50%, D2 30%, mid LCx moderately calcified 30%, RCA mildly calcified with diffuse 30% proximally.  RCA noted be occluded in the mid segment with faint left-to-right collaterals-underwent PCI/DES to RCA.  Echo LVEF 55-60, mild LVH, grade 1 diastolic dysfunction, inferior HK, elevated CVP, dilated IVC suggestive of elevated RA pressure.  Admitted 02/2014 chest pain, ruled out, left AMA.  Admitted 02/2915 chest pain, ruled out, left heart cath with patent RCA stent with proximal RCA 55%, D1 80%, mid LCx to distal LCx 90%.  Admitted 09/2015 with chest pain, ruled out, left heart cath unchanged nonobstructive disease.  Echo 09/2016 EF 65 to 70%, normal wall motion, grade 2 DD.  Admitted 09/2018 recurrent chest pain, repeat LHC left main without significant disease, D1 80%, proximal LCx 30%, mid to distal LCx 90%, proximal RCA 30% prior to the previously placed stent in the proximal to mid region without significant ISR.  LVEF 55%.  Recommended for medical management.  Ranexa 500 twice daily started.  He stopped Ranexa on his own due to jittery feeling.  Diagnosed COPD spring 2020.  Office visit 10/25/2018 noted palpitations.  ZIO 11/2018 showed sinus rhythm with average heart rate of 104 bpm, 2 SVT runs with fastest interval 6 beats with a maximum rate of 152 and longest interval 8 beats with average rate of 111, isolated PAC, atrial couplets, triplets, PVC, ventricular couplets were rare.  Ventricular bigeminy noted.  Toprol increased.  Echo 12/19/2018 EF 123456, diastolic dysfunction.  Seen by [facility] gastroenterology 07/19/2019.  Recommended for HIDA scan to rule out chronic gallbladder disease.  Present today with his wife.  Reports feeling overall well. Chief complaint is a runny nose.  Reports no history  of allergies.  Tells me 7 yesterday started using oxygen and it seems to have gotten worse with use of Trelegy.  No sore throat in the morning solicitation postnasal drip.  We discussed the possibility of adding an over-the-counter antihistamine but encouraged him to discuss with his PCP and/or pulmonologist.  Reports no chest pain, pressure, tightness.  Reports no shortness of breath at rest.  Reports that his dyspnea on exertion has improved significantly since starting Trelegy.   Reports intermittent palpitation after taking a shower this morning. Reports this is a lot of exertion for him.  Endorses some dyspnea with this and reports it got better after using his pursed lip breathing.  No formal exercise routine.  EKGs/Labs/Other Studies Reviewed:   The following studies were reviewed today:  2D Echo 12/19/2018: 1. The left ventricle has normal systolic function with an ejection fraction of 60-65%. The cavity size was normal. There is mildly increased left ventricular wall thickness. Left ventricular diastolic Doppler parameters  are consistent with impaired  relaxation. No evidence of left ventricular regional wall motion abnormalities.  2. The right ventricle has normal systolic function. The cavity was normal. There is no increase in right ventricular wall thickness. Right ventricular systolic pressure could not be assessed.  3. The aortic valve was not well visualized. __________   [facility] Reach monitor 11/2018: Event Monitor   Normal sinus rhythm Avg HR of 104 bpm.   2 Supraventricular Tachycardia runs occurred, the run with the fastest interval lasting 6 beats with a max rate of 152 bpm,  the longest lasting 8 beats with an avg rate of 111 bpm.    Isolated SVEs were rare (<1.0%), SVE Couplets were rare (<1.0%), and SVE Triplets were rare (<1.0%). Isolated VEs were rare (<1.0%), VE Couplets were rare (<1.0%), and no VE Triplets were present. Ventricular Bigeminy was present.   Patient  triggered events were not associated with significant arrhythmia __________   [facility] 09/2018: Coronary angiography:  Coronary dominance: Right or codominant  Left mainstem:   Large vessel that bifurcates into the LAD and left circumflex, no significant disease noted  Left anterior descending (LAD):   Large vessel that extends to the apical region, diagonal branch 2, first diagonal branch is a small to moderate size vessel, severe diffuse disease predominantly in the proximal region estimated at 80%, second diagonal branch moderate to large size vessel no significant disease  Left circumflex (LCx):  Large vessel with OM branch 2, 30% mid left circumflex disease, distal left circumflex after takeoff of OM 2 is severely diseased, small vessel.  There is a large OM branch with no significant disease  Right coronary artery (RCA):  Right dominant vessel with PL and PDA, no significant disease noted.  30% proximal RCA disease prior to long stent in the proximal to mid region, stent with no significant in-stent restenosis  Left ventriculography: Left ventricular systolic function is normal, LVEF is estimated at 55%, there is no significant mitral regurgitation , no significant aortic valve stenosis  Final Conclusions:   Significant disease of a small diagonal branch and small distal left circumflex branches Mean branches of the RCA, left circumflex, LAD and large diagonal are intact with no significant disease  Recommendations:  Etiology of his chest pain could be secondary to small vessel disease Pictures reviewed by interventional cardiology Medical management recommended We will recommend he start Ranexa 500 mg twice daily for 1 week then up to 1000 mg twice daily Continue other outpatient medications  EKG:  EKG is ordered today.  The ekg ordered today demonstrates SR 74 bpm with stable T wave inversion in lateral leads. No acute St/T wave changes.  Recent Labs: 09/06/2018: Magnesium 1.8; TSH  1.212 09/07/2018: Hemoglobin 12.0; Platelets 254 01/04/2019: BUN 16; Creatinine, Ser 1.55; Potassium 3.7; Sodium 141  Recent Lipid Panel    Component Value Date/Time   CHOL 98 09/07/2018 0347   CHOL 156 05/28/2015 0818   TRIG 75 09/07/2018 0347   HDL 31 (L) 09/07/2018 0347   HDL 54 05/28/2015 0818   CHOLHDL 3.2 09/07/2018 0347   VLDL 15 09/07/2018 0347   LDLCALC 52 09/07/2018 0347   LDLCALC 73 05/28/2015 0818    Home Medications   No outpatient medications have been marked as taking for the 07/23/19 encounter (Office Visit) with Loel Dubonnet, NP.    Review of Systems    Review of Systems  Constitution: Negative for chills, fever and malaise/fatigue.  HENT:       (+)  runny nose  Cardiovascular: Positive for dyspnea on exertion. Negative for chest pain, leg swelling, near-syncope, orthopnea, palpitations and syncope.  Respiratory: Negative for cough, shortness of breath and wheezing.   Gastrointestinal: Negative for nausea and vomiting.  Neurological: Negative for dizziness, light-headedness and weakness.   All other systems reviewed and are otherwise negative except as noted above.  Physical Exam    VS:  There were no vitals taken for this visit. , BMI There is no height or weight on file to calculate BMI. GEN: Well nourished, well developed, in no acute distress. HEENT: normal. Neck: Supple, no JVD, carotid bruits, or masses. Cardiac: RRR, no murmurs, rubs, or gallops. No clubbing, cyanosis, edema.  Radials/DP/PT 2+ and equal bilaterally.  Respiratory:  Respirations regular and unlabored, clear to auscultation bilaterally. On 2L at time of exam.  GI: Soft, nontender, nondistended, BS + x 4. MS: No deformity or atrophy. Skin: Warm and dry, no rash. Neuro:  Strength and sensation are intact. Psych: Normal affect.  Accessory Clinical Findings    ECG personally reviewed by me today - SR 74 bpm with stable TWI in lateral leads - no acute changes.  Assessment & Plan      1. CAD with stable angina - Cath 09/2018 diffusely diseased small in size AV groove LCx measuring 88mm and moderate stenosis of small diagonal and nonobstructive dz to RCA prox to previous stent. Medical management recommended. Previous intolerance to Ranexa.  Stable today with no anginal symptoms.  No indication for ischemic evaluation at this time.  GDMT includes aspirin, beta blocker, statin, Zetia, PRN nitroglycerin.  2. Chronic hypoxic respiratory failure secondary to COPD -on 2 L home O2.  Follows closely with pulmonology.  Recently started on Trelegy and notices a significant improvement.  Vaping cessation encouraged.  3. Paroxysmal SVT/palpitations- 2 run SVT on ZIO 12/2018.  Endorses intermittent episodes of palpitations with exertion such as showering.  Refill Toprol 100 mg today.   4. HFpEF - Echo 12/2018 LVEF 123456, diastolic dysfunction.  Euvolemic on exam today.  Reports dyspnea on exertion that is likely related to his COPD but she is not volume related today.  Continue Lasix times daily.  Low-sodium diet encouraged.  5. CKD III - Careful titration of antihypertensives and diuretics.  Avoid nephrotoxic agents.  Follows with PCP.  BMET today  6. HLD, LDL goal <70 - Lipid panel 09/2018 with LDL 52. Continue Atorvastatin and Zetia.   Lipid profile today  7. Labile HTN - Hx of orthostatic hypotension.  Has not been checking blood pressure at home.  Reports he can "tell "when his blood pressure is high or low.  No recent lightheadedness, dizziness.  BP elevated today but within goal at recent office visit.  He was instructed 2-3 times per week and report blood pressure consistently greater than 130/80.  He has hydralazine at home for breakthrough hypertension.  8. Tobacco abuse -presently vaping.  Vaping cessation encouraged. Recommend utilization of [id number].  Disposition: CBC, CMP, lipid panel today.  Follow up in 6 month(s) with Dr. Rockey Situ or APP.  Loel Dubonnet,  NP 07/23/2019, 10:48 AM

## 2019-07-23 NOTE — Patient Instructions (Addendum)
Medication Instructions:  No medication changes today.  *If you need a refill on your cardiac medications before your next appointment, please call your pharmacy*  Lab Work: Your physician recommends that you return for lab work today: CBC, CMET, lipid panel  If you have labs (blood work) drawn today and your tests are completely normal, you will receive your results only by: Marland Kitchen MyChart Message (if you have MyChart) OR . A paper copy in the mail If you have any lab test that is abnormal or we need to change your treatment, we will call you to review the results.  Testing/Procedures: You had an EKG today. It was stable compared to your last one.   Follow-Up: At [facility], you and your health needs are our priority.  As part of our continuing mission to provide you with exceptional heart care, we have created designated Provider Care Teams.  These Care Teams include your primary Cardiologist (physician) and Advanced Practice Providers (APPs -  Physician Assistants and Nurse Practitioners) who all work together to provide you with the care you need, when you need it.  Your next appointment:   6 month(s)  The format for your next appointment:   In Person  Provider:   Ida Rogue, MD  Other Instructions  Recommend checking your blood pressure 2-3 times per week. Our goal is for your blood pressure to be less than 130/80. If your blood pressure is consistently elevated, please call our office.

## 2019-07-24 ENCOUNTER — Other Ambulatory Visit: Payer: Self-pay | Admitting: Gastroenterology

## 2019-07-24 DIAGNOSIS — R1011 Right upper quadrant pain: Secondary | ICD-10-CM

## 2019-07-24 DIAGNOSIS — K828 Other specified diseases of gallbladder: Secondary | ICD-10-CM

## 2019-07-24 LAB — CBC WITH DIFFERENTIAL/PLATELET
Basophils Absolute: 0.1 10*3/uL (ref 0.0–0.2)
Basos: 1 %
EOS (ABSOLUTE): 1.6 10*3/uL — ABNORMAL HIGH (ref 0.0–0.4)
Eos: 17 %
Hematocrit: 41.1 % (ref 37.5–51.0)
Hemoglobin: 13.9 g/dL (ref 13.0–17.7)
Immature Grans (Abs): 0 10*3/uL (ref 0.0–0.1)
Immature Granulocytes: 0 %
Lymphocytes Absolute: 2.1 10*3/uL (ref 0.7–3.1)
Lymphs: 22 %
MCH: 29.1 pg (ref 26.6–33.0)
MCHC: 33.8 g/dL (ref 31.5–35.7)
MCV: 86 fL (ref 79–97)
Monocytes Absolute: 0.7 10*3/uL (ref 0.1–0.9)
Monocytes: 8 %
Neutrophils Absolute: 4.9 10*3/uL (ref 1.4–7.0)
Neutrophils: 52 %
Platelets: 283 10*3/uL (ref 150–450)
RBC: 4.78 x10E6/uL (ref 4.14–5.80)
RDW: 11.8 % (ref 11.6–15.4)
WBC: 9.4 10*3/uL (ref 3.4–10.8)

## 2019-07-24 LAB — LIPID PANEL
Chol/HDL Ratio: 3 ratio (ref 0.0–5.0)
Cholesterol, Total: 114 mg/dL (ref 100–199)
HDL: 38 mg/dL — ABNORMAL LOW (ref >39)
LDL Chol Calc (NIH): 53 mg/dL (ref 0–99)
Triglycerides: 128 mg/dL (ref 0–149)
VLDL Cholesterol Cal: 23 mg/dL (ref 5–40)

## 2019-07-24 LAB — COMPREHENSIVE METABOLIC PANEL
ALT: 8 IU/L (ref 0–44)
AST: 12 IU/L (ref 0–40)
Albumin/Globulin Ratio: 1.6 (ref 1.2–2.2)
Albumin: 4.2 g/dL (ref 3.8–4.8)
Alkaline Phosphatase: 147 IU/L — ABNORMAL HIGH (ref 39–117)
BUN/Creatinine Ratio: 9 — ABNORMAL LOW (ref 10–24)
BUN: 14 mg/dL (ref 8–27)
Bilirubin Total: 0.6 mg/dL (ref 0.0–1.2)
CO2: 30 mmol/L — ABNORMAL HIGH (ref 20–29)
Calcium: 9.6 mg/dL (ref 8.6–10.2)
Chloride: 97 mmol/L (ref 96–106)
Creatinine, Ser: 1.61 mg/dL — ABNORMAL HIGH (ref 0.76–1.27)
GFR calc Af Amer: 49 mL/min/{1.73_m2} — ABNORMAL LOW (ref >59)
GFR calc non Af Amer: 43 mL/min/{1.73_m2} — ABNORMAL LOW (ref >59)
Globulin, Total: 2.7 g/dL (ref 1.5–4.5)
Glucose: 101 mg/dL — ABNORMAL HIGH (ref 65–99)
Potassium: 4 mmol/L (ref 3.5–5.2)
Sodium: 143 mmol/L (ref 134–144)
Total Protein: 6.9 g/dL (ref 6.0–8.5)

## 2019-08-06 DIAGNOSIS — J449 Chronic obstructive pulmonary disease, unspecified: Secondary | ICD-10-CM | POA: Diagnosis not present

## 2019-08-09 ENCOUNTER — Other Ambulatory Visit: Payer: Self-pay

## 2019-08-09 ENCOUNTER — Ambulatory Visit
Admission: RE | Admit: 2019-08-09 | Discharge: 2019-08-09 | Disposition: A | Discharge status: Home / Self Care | Payer: PPO | Source: Ambulatory Visit | Attending: Gastroenterology | Admitting: Gastroenterology

## 2019-08-09 DIAGNOSIS — K5900 Constipation, unspecified: Secondary | ICD-10-CM | POA: Diagnosis not present

## 2019-08-09 DIAGNOSIS — K828 Other specified diseases of gallbladder: Secondary | ICD-10-CM | POA: Insufficient documentation

## 2019-08-09 DIAGNOSIS — R1011 Right upper quadrant pain: Secondary | ICD-10-CM | POA: Diagnosis not present

## 2019-08-09 MED ORDER — TECHNETIUM TC 99M MEBROFENIN IV KIT
5.3100 | PACK | Freq: Once | INTRAVENOUS | Status: AC | PRN
  Administered 2019-08-09: 5.31 via INTRAVENOUS

## 2019-08-13 ENCOUNTER — Other Ambulatory Visit
Admission: RE | Admit: 2019-08-13 | Discharge: 2019-08-13 | Disposition: A | Discharge status: Home / Self Care | Payer: PPO | Source: Ambulatory Visit | Attending: Pulmonary Disease | Admitting: Pulmonary Disease

## 2019-08-13 ENCOUNTER — Ambulatory Visit: Payer: PPO | Admitting: Pulmonary Disease

## 2019-08-13 ENCOUNTER — Other Ambulatory Visit: Payer: Self-pay

## 2019-08-13 ENCOUNTER — Encounter: Payer: Self-pay | Admitting: Pulmonary Disease

## 2019-08-13 VITALS — BP 136/78 | HR 95 | Temp 97.5°F | Ht 69.0 in | Wt 179.0 lb

## 2019-08-13 DIAGNOSIS — J300 Vasomotor rhinitis: Secondary | ICD-10-CM

## 2019-08-13 DIAGNOSIS — R5383 Other fatigue: Secondary | ICD-10-CM

## 2019-08-13 LAB — TSH: TSH: 1.928 u[IU]/mL (ref 0.350–4.500)

## 2019-08-13 MED ORDER — IPRATROPIUM BROMIDE 0.06 % NA SOLN: 2.0000 | Freq: Four times a day (QID) | NASAL | 6 refills | Status: DC

## 2019-08-13 NOTE — Progress Notes (Signed)
 Assessment & Plan:  1. Other fatigue (Primary) - TSH; Future - T3; Future - T4; Future  2. Vasomotor rhinitis   Patient Instructions  Continue Trelegy Ellipta   Continue albuterol  inhaler as needed  Try to change Daliresp  to 1 tablet every other day  Get a stool softener like Colace over-the-counter take every day, drink plenty of water  We are going to check your thyroid  function  We will see you in follow-up in 2 months time.  Please note: late entry documentation due to logistical difficulties during COVID-19 pandemic. This note is filed for information purposes only, and is not intended to be used for billing, nor does it represent the full scope/nature of the visit in question. Please see any associated scanned media linked to date of encounter for additional pertinent information.  Subjective:    HPI: William Mueller is a 69 y.o. male presenting to the pulmonology clinic on 08/13/2019 with report of: Follow-up (pt states breathing is baseline. c/o sob with exertion, nasal drainage clear in color, ear popping at night and prod cough with clear mucus mainly at night. )     Outpatient Encounter Medications as of 08/13/2019  Medication Sig   acetaminophen  (TYLENOL ) 500 MG tablet Take 1,000 mg by mouth daily as needed for headache.    OXYGEN  Inhale 2 L into the lungs daily as needed (oxygen ).    [DISCONTINUED] albuterol  (PROVENTIL  HFA;VENTOLIN  HFA) 108 (90 Base) MCG/ACT inhaler Inhale into the lungs every 6 (six) hours as needed for wheezing or shortness of breath.   [DISCONTINUED] ALPRAZolam  (XANAX  XR) 3 MG 24 hr tablet Take 3 mg by mouth 2 (two) times daily. 1 MG IN THE MORNING AND 3 MG IN THE EVENING (Patient not taking: Reported on 11/23/2021)   [DISCONTINUED] ALPRAZolam  (XANAX ) 1 MG tablet Take 2 mg by mouth daily.   [DISCONTINUED] aspirin  81 MG EC tablet Take 1 tablet (81 mg total) by mouth daily.   [DISCONTINUED] atorvastatin  (LIPITOR ) 80 MG tablet Take 1 tablet (80  mg total) by mouth daily.   [DISCONTINUED] ezetimibe  (ZETIA ) 10 MG tablet Take 1 tablet (10 mg total) by mouth daily.   [DISCONTINUED] Fluticasone -Umeclidin-Vilant (TRELEGY ELLIPTA ) 100-62.5-25 MCG/INH AEPB Inhale 1 puff into the lungs daily.   [DISCONTINUED] furosemide  (LASIX ) 20 MG tablet Take 20 mg by mouth daily.    [DISCONTINUED] hydrALAZINE  (APRESOLINE ) 25 MG tablet Take 25 mg by mouth as needed.   [DISCONTINUED] meclizine  (ANTIVERT ) 25 MG tablet Take 1 tablet (25 mg total) by mouth 3 (three) times daily as needed for dizziness.   [DISCONTINUED] metoprolol  succinate (TOPROL -XL) 100 MG 24 hr tablet Take 1 tablet (100 mg total) by mouth daily. Take with or immediately following a meal.   [DISCONTINUED] nitroGLYCERIN  (NITROSTAT ) 0.4 MG SL tablet Place 1 tablet (0.4 mg total) under the tongue every 5 (five) minutes as needed for chest pain.   [DISCONTINUED] roflumilast  (DALIRESP ) 500 MCG TABS tablet Take 1 tablet (500 mcg total) by mouth daily. (Patient taking differently: Take 500 mcg by mouth daily. LUNCH)   [DISCONTINUED] albuterol  (PROVENTIL ) (2.5 MG/3ML) 0.083% nebulizer solution Take 3 mLs (2.5 mg total) by nebulization every 6 (six) hours as needed for wheezing or shortness of breath. (Patient not taking: Reported on 07/16/2020)   [DISCONTINUED] ipratropium (ATROVENT ) 0.06 % nasal spray Place 2 sprays into both nostrils 4 (four) times daily. (Patient not taking: Reported on 07/16/2020)   No facility-administered encounter medications on file as of 08/13/2019.      Objective:  Vitals:   08/13/19 1031  BP: 136/78  Pulse: 95  Temp: (!) 97.5 F (36.4 C)  Height: 5' 9 (1.753 m)  Weight: 179 lb (81.2 kg)  SpO2: 96% Comment: 2L pulse  TempSrc: Temporal  BMI (Calculated): 26.42     Physical exam documentation is limited by delayed entry of information.

## 2019-08-13 NOTE — Patient Instructions (Signed)
Continue Trelegy Ellipta  Continue albuterol inhaler as needed  Try to change Daliresp to 1 tablet every other day  Get a stool softener like Colace over-the-counter take every day, drink plenty of water  We are going to check your thyroid function  We will see you in follow-up in 2 months time.

## 2019-08-14 LAB — T4: T4, Total: 7.1 ug/dL (ref 4.5–12.0)

## 2019-08-14 LAB — T3: T3, Total: 115 ng/dL (ref 71–180)

## 2019-08-31 ENCOUNTER — Other Ambulatory Visit: Payer: Self-pay

## 2019-08-31 ENCOUNTER — Telehealth: Payer: Self-pay | Admitting: Pulmonary Disease

## 2019-08-31 DIAGNOSIS — I25118 Atherosclerotic heart disease of native coronary artery with other forms of angina pectoris: Secondary | ICD-10-CM

## 2019-08-31 DIAGNOSIS — I10 Essential (primary) hypertension: Secondary | ICD-10-CM

## 2019-08-31 DIAGNOSIS — I471 Supraventricular tachycardia: Secondary | ICD-10-CM

## 2019-08-31 MED ORDER — METOPROLOL SUCCINATE ER 100 MG PO TB24: 100.0000 mg | ORAL_TABLET | Freq: Every day | ORAL | 1 refills | Status: DC

## 2019-08-31 MED ORDER — ROFLUMILAST 500 MCG PO TABS: 500.0000 ug | ORAL_TABLET | Freq: Every day | ORAL | 1 refills | Status: DC

## 2019-08-31 NOTE — Telephone Encounter (Signed)
Instructions from OV 3/8  Continue Trelegy Ellipta  Continue albuterol inhaler as needed  Try to change Daliresp to 1 tablet every other day  Get a stool softener like Colace over-the-counter take every day, drink plenty of water  We are going to check your thyroid function  We will see you in follow-up in 2 months time.     Called and spoke with pt's wife [name] and verified med and preferred pharmacy. Read last OV that pt had with Dr. Patsey Berthold and saw where she mentioned for pt to try to change to 1 tab every other day. I did state that in there for the instructions. Nothing further needed.

## 2019-08-31 NOTE — Telephone Encounter (Signed)
*  STAT* If patient is at the pharmacy, call can be transferred to refill team.   1. Which medications need to be refilled? (please list name of each medication and dose if known) Metoprolol  2. Which pharmacy/location (including street and city if local pharmacy) is medication to be sent to? [place]  3. Do they need a 30 day or 90 day supply? [place]

## 2019-09-06 DIAGNOSIS — J449 Chronic obstructive pulmonary disease, unspecified: Secondary | ICD-10-CM | POA: Diagnosis not present

## 2019-09-10 ENCOUNTER — Telehealth: Payer: Self-pay

## 2019-09-10 DIAGNOSIS — E785 Hyperlipidemia, unspecified: Secondary | ICD-10-CM

## 2019-09-10 NOTE — Telephone Encounter (Signed)
*  STAT* If patient is at the pharmacy, call can be transferred to refill team.   1. Which medications need to be refilled? (please list name of each medication and dose if known) Atorvastatin  2. Which pharmacy/location (including street and city if local pharmacy) is medication to be sent to? [place]  3. Do they need a 30 day or 90 day supply? [place]

## 2019-09-17 NOTE — Telephone Encounter (Signed)
Patients wife calling stating that this medication has not been received by [place] [employer]

## 2019-09-17 NOTE — Telephone Encounter (Signed)
Spoke with pharmacist at [facility] who states Rx has been filled and is ready for patient to pick up. Called patient to let him know.

## 2019-10-02 DIAGNOSIS — I25118 Atherosclerotic heart disease of native coronary artery with other forms of angina pectoris: Secondary | ICD-10-CM | POA: Diagnosis not present

## 2019-10-02 DIAGNOSIS — E261 Secondary hyperaldosteronism: Secondary | ICD-10-CM | POA: Diagnosis not present

## 2019-10-02 DIAGNOSIS — J449 Chronic obstructive pulmonary disease, unspecified: Secondary | ICD-10-CM | POA: Diagnosis not present

## 2019-10-02 DIAGNOSIS — E441 Mild protein-calorie malnutrition: Secondary | ICD-10-CM | POA: Diagnosis not present

## 2019-10-02 DIAGNOSIS — E785 Hyperlipidemia, unspecified: Secondary | ICD-10-CM | POA: Diagnosis not present

## 2019-10-02 DIAGNOSIS — F339 Major depressive disorder, recurrent, unspecified: Secondary | ICD-10-CM | POA: Diagnosis not present

## 2019-10-02 DIAGNOSIS — H353210 Exudative age-related macular degeneration, right eye, stage unspecified: Secondary | ICD-10-CM | POA: Diagnosis not present

## 2019-10-02 DIAGNOSIS — D692 Other nonthrombocytopenic purpura: Secondary | ICD-10-CM | POA: Diagnosis not present

## 2019-10-02 DIAGNOSIS — I739 Peripheral vascular disease, unspecified: Secondary | ICD-10-CM | POA: Diagnosis not present

## 2019-10-02 DIAGNOSIS — I509 Heart failure, unspecified: Secondary | ICD-10-CM | POA: Diagnosis not present

## 2019-10-02 DIAGNOSIS — F419 Anxiety disorder, unspecified: Secondary | ICD-10-CM | POA: Diagnosis not present

## 2019-10-02 DIAGNOSIS — I130 Hypertensive heart and chronic kidney disease with heart failure and stage 1 through stage 4 chronic kidney disease, or unspecified chronic kidney disease: Secondary | ICD-10-CM | POA: Diagnosis not present

## 2019-10-06 DIAGNOSIS — J449 Chronic obstructive pulmonary disease, unspecified: Secondary | ICD-10-CM | POA: Diagnosis not present

## 2019-10-11 IMAGING — MR MR LUMBAR SPINE W/O CM
4 of 5 series · 24 of 48 positions shown · non-contrast
Comparison: Prior radiographs 08/17/2017.

CLINICAL DATA: Initial evaluation for low back pain with bilateral
leg pain for 4 months.

EXAM:
MRI LUMBAR SPINE WITHOUT CONTRAST
TECHNIQUE: Multiplanar, multisequence MR imaging of the lumbar spine was
performed. No intravenous contrast was administered.

[Series 2: T2 · sagittal · 4.0mm · 0.55mm/px · 6 of 15 slices shown (1 of 2)]
[im 1/15]
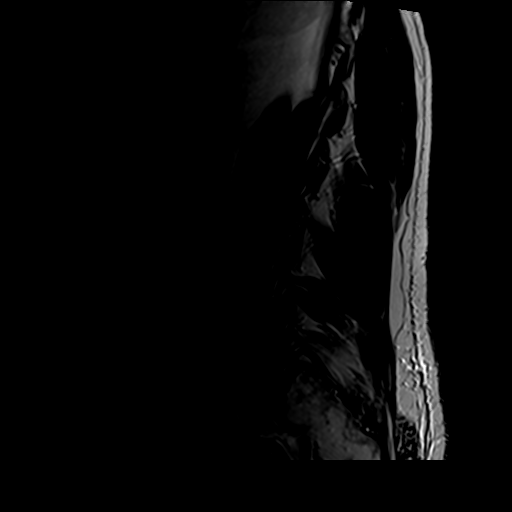
[im 3/15]
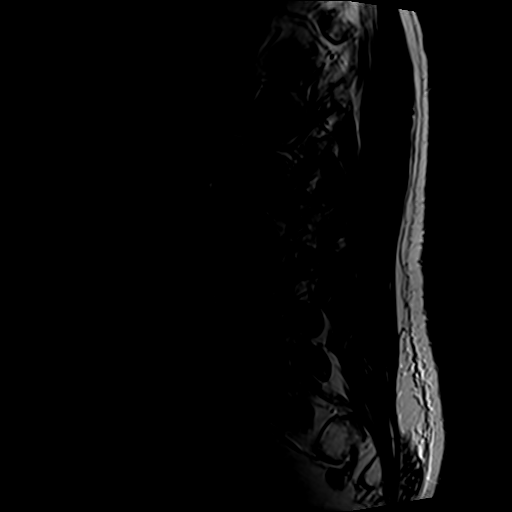
[im 6/15]
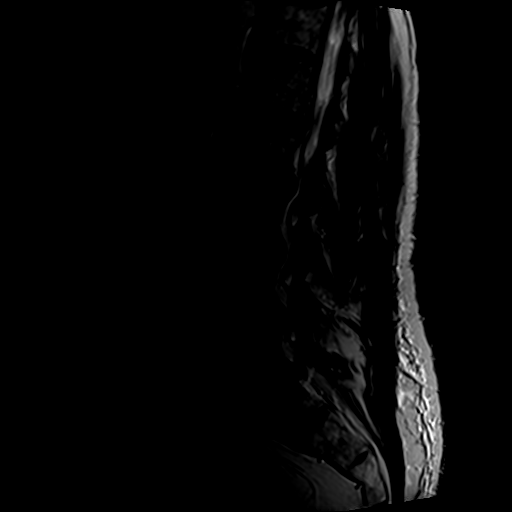
[im 9/15]
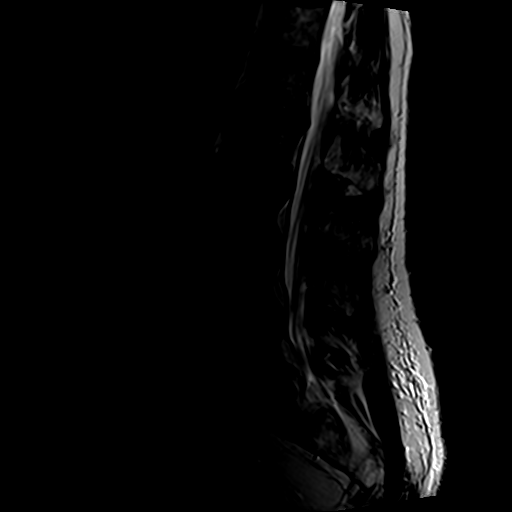
[im 12/15]
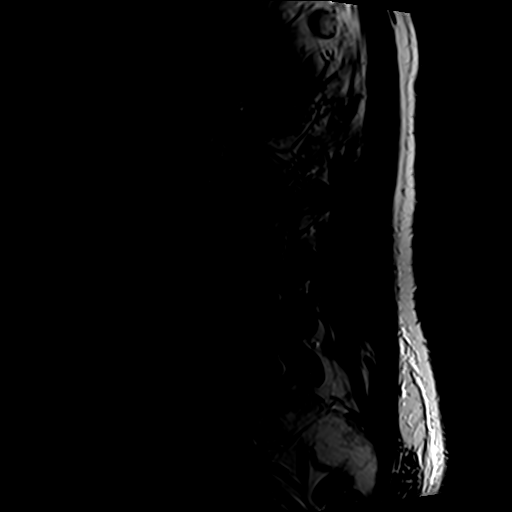
[im 15/15]
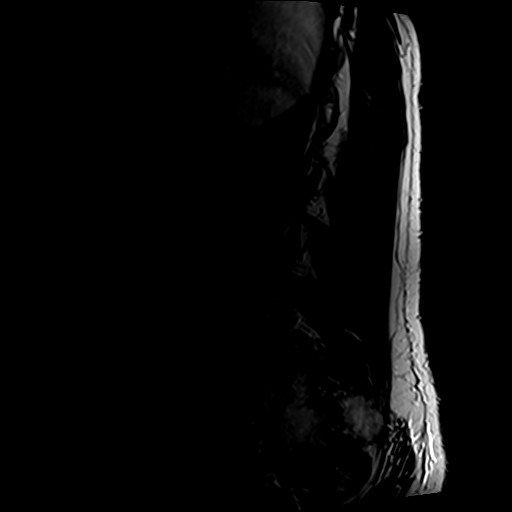

[Series 4: T1 · sagittal · 4.0mm · 0.55mm/px · 6 of 15 slices shown (1 of 2)]
[im 1/15]
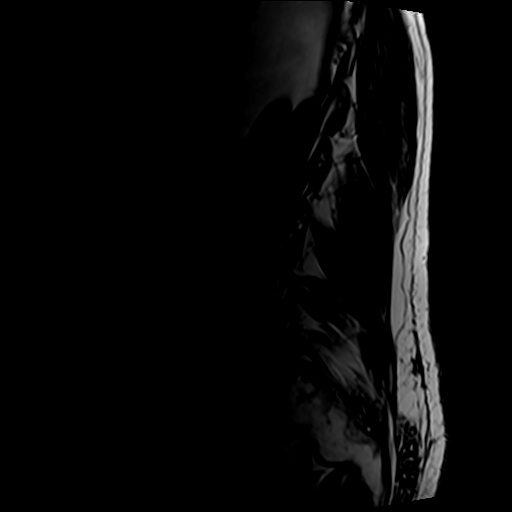
[im 3/15]
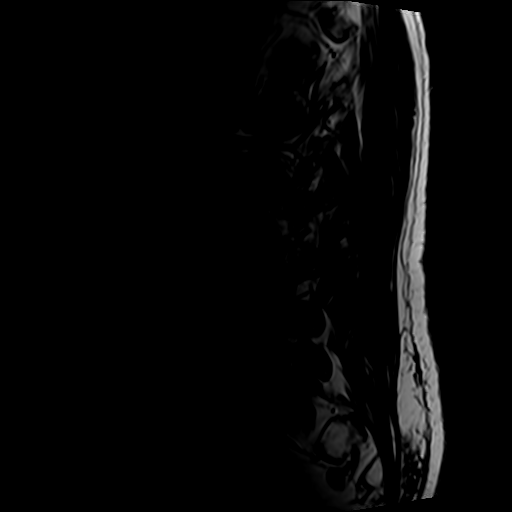
[im 6/15]
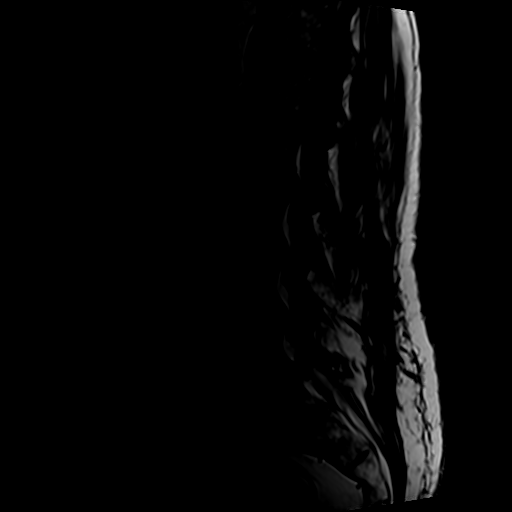
[im 9/15]
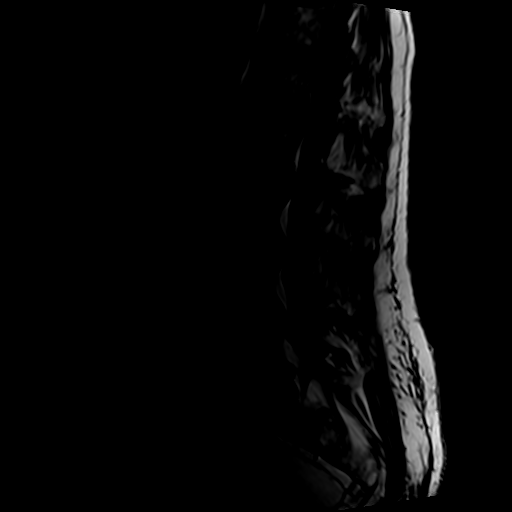
[im 12/15]
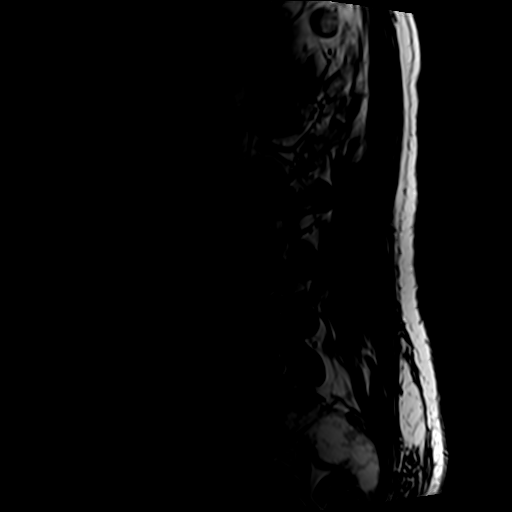
[im 15/15]
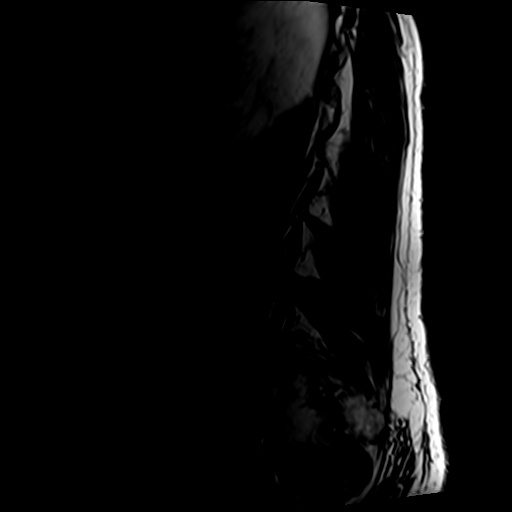

[Series 5: T2 · axial · 4.0mm · 0.70mm/px · z∈[-108,+114]mm · 9 of 39 slices shown (2 of 2)]
[im 1/39]
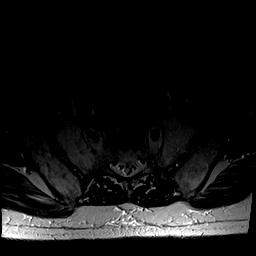
[im 6/39]
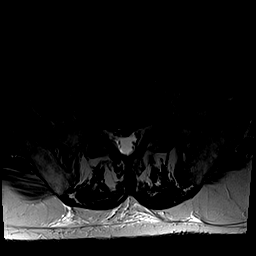
[im 11/39]
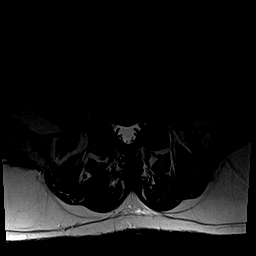
[im 17/39]
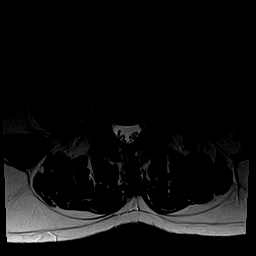
[im 20/39]
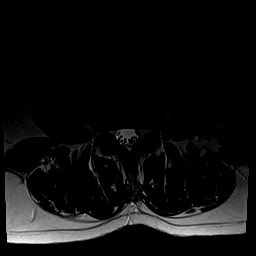
[im 22/39]
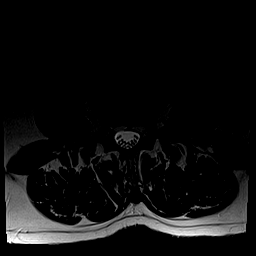
[im 28/39]
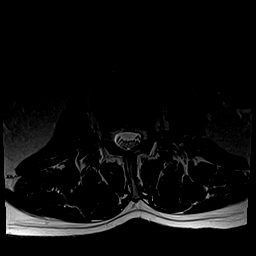
[im 33/39]
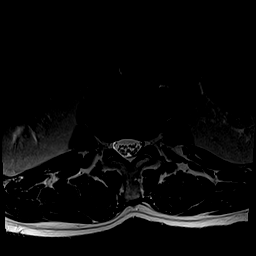
[im 39/39]
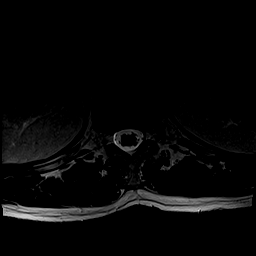

[Series 6: T1 · axial · 4.0mm · 0.35mm/px · z∈[-82,+73]mm · 3 of 39 slices shown (2 of 2)]
[im 6/39]
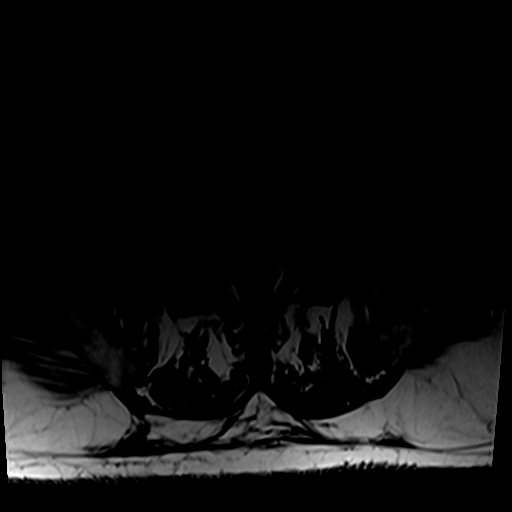
[im 20/39]
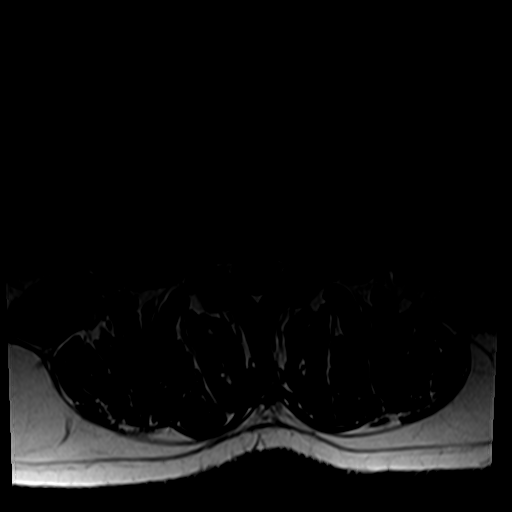
[im 33/39]
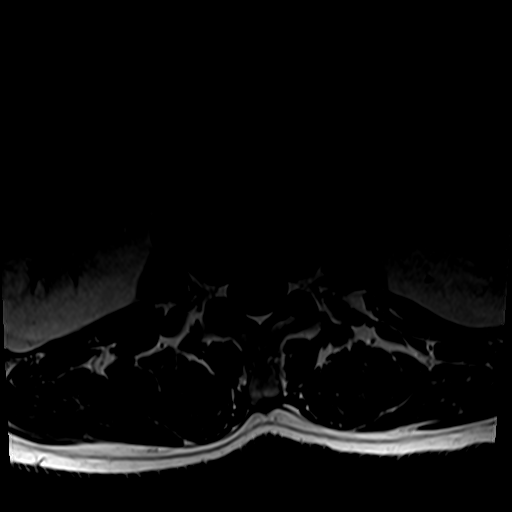

[24 of 48 positions shown; findings below may reference images not displayed]

FINDINGS: Segmentation: Normal segmentation. Lowest well-formed disc labeled
the L5-S1 level.

Alignment: Vertebral bodies normally aligned with preservation of
the normal lumbar lordosis. No listhesis.

Vertebrae: Vertebral body heights maintained without evidence for
acute or chronic fracture. Bone marrow signal intensity mildly
heterogeneous without discrete or worrisome osseous lesion. No
abnormal marrow edema.

Conus medullaris and cauda equina: Conus extends to the L1-2 level.
Conus and cauda equina appear normal.

Paraspinal and other soft tissues: Paraspinous soft tissues are
within normal limits. Subcentimeter T2 hyperintense cysts noted
within left kidney. Visualized visceral structures otherwise
unremarkable.

Disc levels:

L1-2: Mild annular disc bulge with disc desiccation. No canal or
foraminal stenosis.

L2-3: Minimal annular disc bulge with disc desiccation. Mild facet
and ligamentum flavum hypertrophy. No stenosis or impingement.

L3-4:  Negative interspace.  Mild facet hypertrophy.  No stenosis.

L4-5: Mild diffuse disc bulge, slightly eccentric to the right.
Superimposed tiny central disc protrusion indents the ventral thecal
sac. Mild facet and ligament flavum hypertrophy. Resultant mild
bilateral lateral recess narrowing, slightly greater on the left.
Mild bilateral L4 foraminal narrowing, right slightly worse than
left.

L5-S1: Mild diffuse disc bulge with disc desiccation. Superimposed
broad central disc protrusion with slight caudad angulation.
Protruding disc closely approximates the descending S1 nerve roots
without neural impingement or displacement. Mild facet hypertrophy.
No significant canal stenosis. Mild bilateral L5 foraminal
narrowing, right slightly worse than left.
IMPRESSION: 1. Broad central disc protrusion at L5-S1, closely approximating the
descending S1 nerve roots without impingement.
2. Disc bulge with facet hypertrophy at L4-5 with resultant mild
bilateral lateral recess and foraminal stenosis.

## 2019-11-06 DIAGNOSIS — J449 Chronic obstructive pulmonary disease, unspecified: Secondary | ICD-10-CM | POA: Diagnosis not present

## 2019-12-05 ENCOUNTER — Telehealth: Payer: Self-pay

## 2019-12-05 ENCOUNTER — Other Ambulatory Visit: Payer: Self-pay

## 2019-12-05 ENCOUNTER — Ambulatory Visit: Payer: PPO | Admitting: Pulmonary Disease

## 2019-12-05 VITALS — BP 118/74 | HR 75 | Temp 97.9°F | Ht 69.0 in | Wt 179.0 lb

## 2019-12-05 DIAGNOSIS — J300 Vasomotor rhinitis: Secondary | ICD-10-CM | POA: Diagnosis not present

## 2019-12-05 DIAGNOSIS — J9611 Chronic respiratory failure with hypoxia: Secondary | ICD-10-CM

## 2019-12-05 DIAGNOSIS — R5383 Other fatigue: Secondary | ICD-10-CM | POA: Diagnosis not present

## 2019-12-05 DIAGNOSIS — F17299 Nicotine dependence, other tobacco product, with unspecified nicotine-induced disorders: Secondary | ICD-10-CM

## 2019-12-05 DIAGNOSIS — J449 Chronic obstructive pulmonary disease, unspecified: Secondary | ICD-10-CM

## 2019-12-05 MED ORDER — TRELEGY ELLIPTA 100-62.5-25 MCG/INH IN AEPB: 1.0000 | INHALATION_SPRAY | Freq: Every day | RESPIRATORY_TRACT | 11 refills | Status: DC

## 2019-12-05 NOTE — Telephone Encounter (Signed)
Patient assistance form and Rx for Trelegy has been sent to [facility] at [phone]. Received fax conformation.    Nothing further is needed at this time.

## 2019-12-05 NOTE — Patient Instructions (Signed)
What we discussed today:  Call [employer] so they can check your portable concentrator.  Continue Trelegy as you are doing.  You may decrease your nasal spray to 1 spray to each side only if needed  If you continue to have problems with your stomach and abdomen consider decreasing the Daliresp to every other day to see if this helps.  We are going to check your eligibility for a pulmonary rehab program.  We will see you in follow-up in 3 months time call sooner should any new difficulties arise.

## 2019-12-05 NOTE — Progress Notes (Signed)
Subjective:    Patient ID: William Mueller, male    DOB: 1951/05/05, 69 y.o.   MRN: [id number]  Requesting MD/Service: Self Date of initial consultation: 11/07/18 by Dr. Merton Border Reason for consultation: Very severe COPD, former smoker, chronic hypoxemic respiratory failure  PT PROFILE: 69y.o. male former smoker (approx 100 p-y history, quit 2011), still vaping occasionally previously seen by Dr Ashby Dawes, Dr Raul Del and Dr. Alva Garnet.  Assumed care on December 2020 after Dr. Alva Garnet' departure.  DATA: 09/29/17 CTA chest: no PE. No significant emphysema 12/23/15 PFTs: FVC: 2.10 > 2.33  L (47>53 %pred), FEV1: 0.91 >1.04 L (27% >31 %pred), FEV1/FVC: 43%, TLC:  L ( %pred), DLCO 46 %pred    INTERVAL: Most recent visit, 13 August 2019 with me at that time instructed to continue Trelegy Ellipta, continue albuterol, change Daliresp to every other day (due to GI distress), thyroid function checked (normal).  No new symptoms.  HPI 69 year old former cigarette smoker continues to do occasional vaping.  States that he has "good days and bad days" uses oxygen via portable concentrator.  He has been having issues with his portable concentrator due to battery issues.  He has not contacted his DME company yet.  He is using Trelegy Ellipta once a day.  His prior GI issues continue to plague him, he did not decrease Daliresp to every other day as recommended previously.  Because of his severe COPD he cannot undergo colonoscopy which is what GI has recommended.  He has issues with vasomotor rhinitis and will spray (Atrovent) does dry him out too much.  He has been using it regularly rather than just as needed.  He notes that he is trying to increase his activity but would like guidance in this regard.  He has not had any fevers, chills or sweats.  No increased sputum production over normal.  No hemoptysis.  No chest pain.  Has chronic orthopnea with no change of her baseline, no paroxysmal nocturnal  dyspnea no lower extremity edema.  No calf tenderness.   Review of Systems A 10 point review of systems was performed and it is as noted above otherwise negative.  Allergies  Allergen Reactions  . Paroxetine Hcl Shortness Of Breath and Palpitations  . Serotonin Reuptake Inhibitors (Ssris) Shortness Of Breath and Palpitations  . Diazepam Other (See Comments)    Rapid heartrate  . Doxycycline Monohydrate Other (See Comments)    Unknown allergic reaction  . Escitalopram Oxalate Other (See Comments)    serotonin Syndrome  . Lorazepam Other (See Comments)    Unknown allergic reaction  . Tetracyclines & Related Itching and Rash   Current Meds  Medication Sig  . acetaminophen (TYLENOL) 500 MG tablet Take 1,000 mg by mouth daily as needed for headache.   . albuterol (PROVENTIL HFA;VENTOLIN HFA) 108 (90 Base) MCG/ACT inhaler Inhale into the lungs every 6 (six) hours as needed for wheezing or shortness of breath.  Marland Kitchen albuterol (PROVENTIL) (2.5 MG/3ML) 0.083% nebulizer solution Take 3 mLs (2.5 mg total) by nebulization every 6 (six) hours as needed for wheezing or shortness of breath.  . ALPRAZolam (XANAX XR) 3 MG 24 hr tablet Take 3 mg by mouth 2 (two) times daily. 1 MG IN THE MORNING AND 3 MG IN THE EVENING  . ALPRAZolam (XANAX) 1 MG tablet Take 1 mg by mouth daily.   Marland Kitchen aspirin 81 MG EC tablet Take 1 tablet (81 mg total) by mouth daily. (Patient taking differently: Take 81 mg by mouth  daily. BREAKFAST)  . atorvastatin (LIPITOR) 80 MG tablet Take 1 tablet (80 mg total) by mouth daily.  Marland Kitchen ezetimibe (ZETIA) 10 MG tablet Take 1 tablet (10 mg total) by mouth daily.  . Fluticasone-Umeclidin-Vilant (TRELEGY ELLIPTA) 100-62.5-25 MCG/INH AEPB Inhale 1 puff into the lungs daily.  . furosemide (LASIX) 20 MG tablet Take 20 mg by mouth daily.   . hydrALAZINE (APRESOLINE) 25 MG tablet Take 25 mg by mouth as needed.  . meclizine (ANTIVERT) 25 MG tablet Take 1 tablet (25 mg total) by mouth 3 (three) times  daily as needed for dizziness.  . metoprolol succinate (TOPROL-XL) 100 MG 24 hr tablet Take 1 tablet (100 mg total) by mouth daily. Take with or immediately following a meal.  . nitroGLYCERIN (NITROSTAT) 0.4 MG SL tablet Place 1 tablet (0.4 mg total) under the tongue every 5 (five) minutes as needed for chest pain.  . OXYGEN Inhale 2 L into the lungs daily as needed (oxygen).   . roflumilast (DALIRESP) 500 MCG TABS tablet Take 1 tablet (500 mcg total) by mouth daily. If needed, try to take 1tab every other day.   Immunization History  Administered Date(s) Administered  . Influenza, Seasonal, Injecte, Preservative Fre 04/08/2015  . Influenza,inj,Quad PF,6+ Mos 03/14/2018  . Influenza-Unspecified 03/10/2016, 05/22/2019  . Janssen (J&J) SARS-COV-2 Vaccination 08/10/2019  . Pneumococcal-Unspecified 02/05/2013       Objective:   Physical Exam BP 118/74 (BP Location: Left Arm, Cuff Size: Normal)   Pulse 75   Temp 97.9 F (36.6 C)   Ht 5\' 9"  (1.753 m)   Wt 179 lb (81.2 kg)   SpO2 95%   BMI 26.43 kg/m  GENERAL: Well-developed well-nourished, presents in transport chair with portable oxygen concentrator.  Chronic use of accessories.  No conversational dyspnea. HEAD: Normocephalic, atraumatic.  EYES: Pupils equal, round, reactive to light.  No scleral icterus.  MOUTH: Nose/mouth/throat not examined due to masking requirements for COVID 19.  NECK: Supple. No thyromegaly. Trachea midline. No JVD.  No adenopathy. PULMONARY: Distant breath sounds, coarse, no other adventitious sounds. CARDIOVASCULAR: S1 and S2. Regular rate and rhythm.  Distant heart tones, no rubs murmurs or gallops heard. GASTROINTESTINAL: Protuberant abdomen, soft. MUSCULOSKELETAL: No joint deformity, no clubbing, no edema.  NEUROLOGIC: No focal deficits noted.  Speech is fluent. SKIN: Intact,warm,dry.  At that exam no rashes. PSYCH: Behavior normal.      Assessment & Plan:     ICD-10-CM   1. COPD, very severe ([place])   J44.9 AMB referral to pulmonary rehabilitation   Continue Trelegy Ellipta Recommend decreasing Daliresp to every other day   2. Chronic hypoxemic respiratory failure (H[facility])  J96.11    Continue oxygen supplementation at 2 L/min Patient was instructed to call his DME company for POC issues  3. Vasomotor rhinitis  J30.0    Decrease Atrovent to 1 spray to each nostril 3 times daily Only as needed  4. Other fatigue  R53.83    Chronic issue Recommend pulmonary rehab Likely related to severe COPD  5. Nicotine addiction, vaping  F17.299    Counseled with regards to discontinuation of all vaping products   Orders Placed This Encounter  Procedures  . AMB referral to pulmonary rehabilitation    Referral Priority:   Routine    Referral Type:   Consultation    Number of Visits Requested:   1   Discussion:  Patient has severe COPD.  He may benefit from pulmonary rehabilitation to help with his issues of stamina and  overall wellbeing.  He was counseled with regards to discontinuation of all vaping products.  Continue oxygen supplementation at 2 L/min.  We will see him in follow-up in 3 months time prior to that time should any new difficulties arise.   Renold Don, MD [facility]   *This note was dictated using voice recognition software/Dragon.  Despite best efforts to proofread, errors can occur which can change the meaning.  Any change was purely unintentional.

## 2019-12-06 DIAGNOSIS — J449 Chronic obstructive pulmonary disease, unspecified: Secondary | ICD-10-CM | POA: Diagnosis not present

## 2019-12-25 ENCOUNTER — Telehealth: Payer: Self-pay | Admitting: Pulmonary Disease

## 2019-12-25 MED ORDER — TRELEGY ELLIPTA 100-62.5-25 MCG/INH IN AEPB: 1.0000 | INHALATION_SPRAY | Freq: Every day | RESPIRATORY_TRACT | 11 refills | Status: DC

## 2019-12-25 MED ORDER — TRELEGY ELLIPTA 100-62.5-25 MCG/INH IN AEPB: 1.0000 | INHALATION_SPRAY | Freq: Every day | RESPIRATORY_TRACT | 0 refills | Status: DC

## 2019-12-25 NOTE — Telephone Encounter (Signed)
Rx for trelegy 100 has been faxed to [place] at provided fax number.  One month supply has been sent to local pharmacy as requested by pt's spouse, [name] (DPR). Nothing further is needed at this time.

## 2019-12-28 ENCOUNTER — Other Ambulatory Visit: Payer: Self-pay | Admitting: Pulmonary Disease

## 2019-12-28 ENCOUNTER — Encounter: Payer: Self-pay | Admitting: Pulmonary Disease

## 2020-01-02 ENCOUNTER — Telehealth: Payer: Self-pay | Admitting: Pulmonary Disease

## 2020-01-02 MED ORDER — TRELEGY ELLIPTA 200-62.5-25 MCG/INH IN AEPB: 1.0000 | INHALATION_SPRAY | Freq: Every day | RESPIRATORY_TRACT | 0 refills | Status: AC

## 2020-01-02 NOTE — Telephone Encounter (Signed)
Spoke to pt, who is requesting samples of trelegy 100. We do not currently have samples of trelegy 100. Per Dr. Patsey Berthold verbally, okay to give one sample of trelegy 200. Pt is aware and voiced his understanding.  Sample has been placed up front for pick up.  Nothing further is needed at this time.

## 2020-01-06 DIAGNOSIS — J449 Chronic obstructive pulmonary disease, unspecified: Secondary | ICD-10-CM | POA: Diagnosis not present

## 2020-01-19 NOTE — Progress Notes (Signed)
Date:  01/19/2020   ID:  William Mueller, DOB 1950-08-24, MRN [id number]  Patient Location:  [place] [address]   Provider location:   [facility], [place] office  PCP:  Philmore Pali, NP  Cardiologist:  Patsy Baltimore  Chief Complaint  Patient presents with  . other    6 month follow up. Meds reviewed by the pt. verbally. Pt. c/o not being able to make a fist in his right hand most mornings.      History of Present Illness:    William Mueller is a 69 y.o. male  past medical history of long history of smoking, COPD,  chronic renal insufficiency, CR 1.7 CAD  12/11/2013 to [place] with chest pain cardiac catheterization lab : occluded mid RCA. Stent was placed.  Repeat catheterization in October 2016 with patent stent,disease of a small diagonal and distal circumflex not amenable to intervention.  paroxysmal atrial tachycardia Cardiac catheterization April 2017 He presents today for follow-up of his coronary artery disease, stable angina and COPD   In follow-up today reports his breathing is actually doing quite well Having long stretches without oxygen, saturations in the mid 90s No recent episodes of COPD exacerbation, bronchitis  Reports having some significant hand pain Worse in the morning when he wakes up  Having issues with constipation Colonoscopycancelled secondary to lung issues, concern for underlying COPD Considering cologuard  Denies significant chest pain concerning for angina Reports blood pressure at home well controlled  EKG personally reviewed by myself on todays visit Shows normal sinus rhythm rate 63 bpm nonspecific T wave abnormality V5, V6 1 and aVL  Other past medical history reviewed  cardiac catheterization 09/06/2018  grossly patent vessels with Significant disease of a small diagonal branch and small distal left circumflex branches  other major branches of the RCA, left circumflex, LAD and large  diagonal are intact with no significant disease   Mid Cx to Dist Cx lesion is 90% stenosed.  Ost 1st Diag to 1st Diag lesion is 80% stenosed.  Previously placed Prox RCA to Mid RCA stent (unknown type) is widely patent.  Prox RCA lesion is 35% stenosed.  The left ventricular ejection fraction is 55-65% by visual estimate.  The left ventricular systolic function is normal.  LV end diastolic pressure is normal.  There is no mitral valve regurgitation.  Prox Cx lesion is 30% stenosed.    : Medical management start Ranexa 500 mg twice daily for 1 week then up to 1000 mg twice daily It was felt his chest pain was secondary to small vessel disease   Lab work reviewed   total cholesterol 125 LDL 59 creatinine 1.73 normal electrolytes and LFTs   Prior CV studies:   The following studies were reviewed today:    Past Medical History:  Diagnosis Date  . Anginal pain ([place]) 06/2017  . Anxiety   . Bronchitis 06/2017  . Chronic chest pain   . Chronic kidney disease (CKD) stage G3b/A1, moderately decreased glomerular filtration rate (GFR) between 30-44 mL/min/1.73 square meter and albuminuria creatinine ratio less than 30 mg/g   . Chronic lower back pain    WITH LEG WEAKNESS  . COPD (chronic obstructive pulmonary disease) (HCC)    EMPHYSEMA, O2 AT 2L PRN  . Coronary artery disease    a. 12/2013 PCI: mRCA 100% with L to R collats s/p PCI/DES;  b. 06/2014 MV: no ischemia/infarct, EF 53%;  c.  03/2015 Cath: LM nl, LAD nl, D1 80 (1.79mm), LCX 70m (<1.24mm), OM1 nl, RCA 55p, patent stent, RPDA nl, EF 65%; d. 09/2015 Cath: LM nl, LAD 44m, D1 80 (<28mm), LCX 88m/d (<1.75mm), OM1 nl, RCA 55p (FFR 0.93), patent stent, RPDA nl-->Med Rx.  . Daily headache   . Depression   . Diastolic dysfunction    a. echo 12/2013: EF 55-60%, mild LVH, GR1DD, inf HK, elevated CVP, mildly dilated IVC suggestive of increased RA pressure  . Dyspnea    WHEEZING  . GERD (gastroesophageal reflux disease)    REFLUX  .  Hearing loss   . High cholesterol   . Hypertension   . Myocardial infarction ([place]) 2015  . Nicotine addiction    a. using eCigs.  . Orthopnea   . Sleep apnea    Past Surgical History:  Procedure Laterality Date  . CARDIAC CATHETERIZATION     MC x 1 stent  . CARDIAC CATHETERIZATION Left 03/12/2015   Procedure: Left Heart Cath and Coronary Angiography;  Surgeon: Leonie Man, MD;  Location: [place];  Service: Cardiovascular;  Laterality: Left;  . CARDIAC CATHETERIZATION N/A 09/29/2015   Procedure: Left Heart Cath and Coronary Angiography;  Surgeon: Wellington Hampshire, MD;  Location: [facility] [place] [facility];  Service: Cardiovascular;  Laterality: N/A;  . CATARACT EXTRACTION W/PHACO Right 08/10/2017   Procedure: CATARACT EXTRACTION PHACO AND INTRAOCULAR LENS PLACEMENT ([place]) RIGHT;  Surgeon: Leandrew Koyanagi, MD;  Location: [facility];  Service: Ophthalmology;  Laterality: Right;  . CORONARY ANGIOPLASTY     STENT PLACEMENT  . LEFT HEART CATH AND CORONARY ANGIOGRAPHY N/A 09/06/2018   Procedure: LEFT HEART CATH AND CORONARY ANGIOGRAPHY;  Surgeon: Minna Merritts, MD;  Location: [facility];  Service: Cardiovascular;  Laterality: N/A;  . LEFT HEART CATHETERIZATION WITH CORONARY ANGIOGRAM N/A 12/12/2013   Procedure: LEFT HEART CATHETERIZATION WITH CORONARY ANGIOGRAM;  Surgeon: Wellington Hampshire, MD;  Location: [facility];  Service: Cardiovascular;  Laterality: N/A;     No outpatient medications have been marked as taking for the 01/21/20 encounter (Appointment) with Minna Merritts, MD.     Allergies:   Paroxetine hcl, Serotonin reuptake inhibitors (ssris), Diazepam, Doxycycline monohydrate, Escitalopram oxalate, Lorazepam, and Tetracyclines & related   Social History   Tobacco Use  . Smoking status: Former Smoker    Years: 100.00    Types: E-cigarettes    Quit date: 06/07/2009    Years since quitting: 10.6  . Smokeless tobacco: Never Used  . Tobacco comment:  Using electronic cigarette  Vaping Use  . Vaping Use: Every day  Substance Use Topics  . Alcohol use: Yes    Alcohol/week: 6.0 standard drinks    Types: 6 Cans of beer per week    Comment: Occasional   . Drug use: No     Current Outpatient Medications on File Prior to Visit  Medication Sig Dispense Refill  . acetaminophen (TYLENOL) 500 MG tablet Take 1,000 mg by mouth daily as needed for headache.     . albuterol (PROVENTIL HFA;VENTOLIN HFA) 108 (90 Base) MCG/ACT inhaler Inhale into the lungs every 6 (six) hours as needed for wheezing or shortness of breath.    Marland Kitchen albuterol (PROVENTIL) (2.5 MG/3ML) 0.083% nebulizer solution Take 3 mLs (2.5 mg total) by nebulization every 6 (six) hours as needed for wheezing or shortness of breath. 215 mL 5  . ALPRAZolam (XANAX XR) 3 MG 24 hr tablet Take 3 mg by mouth 2 (  two) times daily. 1 MG IN THE MORNING AND 3 MG IN THE EVENING    . ALPRAZolam (XANAX) 1 MG tablet Take 1 mg by mouth daily.   5  . aspirin 81 MG EC tablet Take 1 tablet (81 mg total) by mouth daily. (Patient taking differently: Take 81 mg by mouth daily. BREAKFAST) 90 tablet 3  . atorvastatin (LIPITOR) 80 MG tablet Take 1 tablet (80 mg total) by mouth daily. 90 tablet 3  . ezetimibe (ZETIA) 10 MG tablet Take 1 tablet (10 mg total) by mouth daily. 90 tablet 3  . Fluticasone-Umeclidin-Vilant (TRELEGY ELLIPTA) 100-62.5-25 MCG/INH AEPB Inhale 1 puff into the lungs daily. 60 each 0  . furosemide (LASIX) 20 MG tablet Take 20 mg by mouth daily.     . hydrALAZINE (APRESOLINE) 25 MG tablet Take 25 mg by mouth as needed.    Marland Kitchen ipratropium (ATROVENT) 0.06 % nasal spray Place 2 sprays into both nostrils 4 (four) times daily. (Patient not taking: Reported on 12/05/2019) 15 mL 6  . meclizine (ANTIVERT) 25 MG tablet Take 1 tablet (25 mg total) by mouth 3 (three) times daily as needed for dizziness. 30 tablet 0  . metoprolol succinate (TOPROL-XL) 100 MG 24 hr tablet Take 1 tablet (100 mg total) by mouth daily.  Take with or immediately following a meal. 90 tablet 1  . nitroGLYCERIN (NITROSTAT) 0.4 MG SL tablet Place 1 tablet (0.4 mg total) under the tongue every 5 (five) minutes as needed for chest pain. 25 tablet 1  . OXYGEN Inhale 2 L into the lungs daily as needed (oxygen).     . roflumilast (DALIRESP) 500 MCG TABS tablet Take 1 tablet (500 mcg total) by mouth daily. If needed, try to take 1tab every other day. 90 tablet 1   No current facility-administered medications on file prior to visit.     Family Hx: The patient's family history includes CVA in his mother; Coronary artery disease in his brother and father; Heart disease in his father; Heart disease (age of onset: 27) in his brother. There is no history of Kidney disease or Prostate cancer.  ROS:   Please see the history of present illness.    Review of Systems  Constitutional: Negative.   Respiratory: Positive for shortness of breath.   Cardiovascular: Negative.   Gastrointestinal: Negative.   Musculoskeletal: Negative.   Neurological: Negative.   Psychiatric/Behavioral: Negative.   All other systems reviewed and are negative.     Labs/Other Tests and Data Reviewed:    Recent Labs: 07/23/2019: ALT 8; BUN 14; Creatinine, Ser 1.61; Hemoglobin 13.9; Platelets 283; Potassium 4.0; Sodium 143 08/13/2019: TSH 1.928   Recent Lipid Panel Lab Results  Component Value Date/Time   CHOL 114 07/23/2019 11:55 AM   TRIG 128 07/23/2019 11:55 AM   HDL 38 (L) 07/23/2019 11:55 AM   CHOLHDL 3.0 07/23/2019 11:55 AM   CHOLHDL 3.2 09/07/2018 03:47 AM   LDLCALC 53 07/23/2019 11:55 AM    Wt Readings from Last 3 Encounters:  12/05/19 179 lb (81.2 kg)  08/13/19 179 lb (81.2 kg)  07/23/19 180 lb 4 oz (81.8 kg)     Exam:    BP (!) 150/82 (BP Location: Right Arm, Patient Position: Sitting, Cuff Size: Normal)   Pulse 63   Ht 5\' 9"  (1.753 m)   Wt 179 lb 4 oz (81.3 kg)   SpO2 98% Comment: oxygen 2 liters  BMI 26.47 kg/m  Constitutional:   oriented to person, place, and time.  No distress.  HENT:  Head: Normocephalic and atraumatic.  Eyes:  no discharge. No scleral icterus.  Neck: Normal range of motion. Neck supple. No JVD present.  Cardiovascular: Normal rate, regular rhythm, normal heart sounds and intact distal pulses. Exam reveals no gallop and no friction rub. No edema No murmur heard. Pulmonary/Chest: Effort normal and breath sounds normal. No stridor. No respiratory distress.  no wheezes.  no rales.  no tenderness.  Abdominal: Soft.  no distension.  no tenderness.  Musculoskeletal: Normal range of motion.  no  tenderness or deformity.  Neurological:  normal muscle tone. Coordination normal. No atrophy Skin: Skin is warm and dry. No rash noted. not diaphoretic.  Psychiatric:  normal mood and affect. behavior is normal. Thought content normal.    ASSESSMENT & PLAN:    Atherosclerosis of native coronary artery of native heart with unstable angina pectoris ([place]) Prior catheterization last year, medical management recommended Denies having anginal symptoms Non-smoker, cholesterol at goal No further testing at this time  Atrial tachycardia, paroxysmal (HCC) Denies any breakthrough tachycardia, continue   CKD (chronic kidney disease) stage 3, GFR 30-59 ml/min (HCC) CR 1.6 stable  Centrilobular emphysema (HCC) On chronic oxygen, Stopped smoking Reports stable numbers  Labile essential hypertension Blood pressure is well controlled on today's visit. No changes made to the medications.     Total encounter time more than 25 minutes  Greater than 50% was spent in counseling and coordination of care with the patient    Signed, Ida Rogue, MD  01/19/2020 2:50 PM    [facility] [address], [place], [place] [address]

## 2020-01-21 ENCOUNTER — Other Ambulatory Visit: Payer: Self-pay

## 2020-01-21 ENCOUNTER — Encounter: Payer: Self-pay | Admitting: Cardiovascular Disease

## 2020-01-21 ENCOUNTER — Ambulatory Visit: Payer: PPO | Admitting: Cardiovascular Disease

## 2020-01-21 VITALS — BP 150/82 | HR 63 | Ht 69.0 in | Wt 179.2 lb

## 2020-01-21 DIAGNOSIS — N183 Chronic kidney disease, stage 3 unspecified: Secondary | ICD-10-CM | POA: Diagnosis not present

## 2020-01-21 DIAGNOSIS — I5032 Chronic diastolic (congestive) heart failure: Secondary | ICD-10-CM

## 2020-01-21 DIAGNOSIS — I10 Essential (primary) hypertension: Secondary | ICD-10-CM

## 2020-01-21 DIAGNOSIS — I25118 Atherosclerotic heart disease of native coronary artery with other forms of angina pectoris: Secondary | ICD-10-CM | POA: Diagnosis not present

## 2020-01-21 DIAGNOSIS — G8929 Other chronic pain: Secondary | ICD-10-CM | POA: Diagnosis not present

## 2020-01-21 DIAGNOSIS — I471 Supraventricular tachycardia: Secondary | ICD-10-CM | POA: Diagnosis not present

## 2020-01-21 DIAGNOSIS — J9611 Chronic respiratory failure with hypoxia: Secondary | ICD-10-CM

## 2020-01-21 DIAGNOSIS — R079 Chest pain, unspecified: Secondary | ICD-10-CM | POA: Diagnosis not present

## 2020-01-21 DIAGNOSIS — R0602 Shortness of breath: Secondary | ICD-10-CM

## 2020-01-21 NOTE — Patient Instructions (Signed)
Medication Instructions:  No changes  If you need a refill on your cardiac medications before your next appointment, please call your pharmacy.    Lab work: No new labs needed   If you have labs (blood work) drawn today and your tests are completely normal, you will receive your results only by: . MyChart Message (if you have MyChart) OR . A paper copy in the mail If you have any lab test that is abnormal or we need to change your treatment, we will call you to review the results.   Testing/Procedures: No new testing needed   Follow-Up: At [facility], you and your health needs are our priority.  As part of our continuing mission to provide you with exceptional heart care, we have created designated Provider Care Teams.  These Care Teams include your primary Cardiologist (physician) and Advanced Practice Providers (APPs -  Physician Assistants and Nurse Practitioners) who all work together to provide you with the care you need, when you need it.  . You will need a follow up appointment in 12 months  . Providers on your designated Care Team:   . Christopher Berge, NP . Ryan [place], PA-C . Jacquelyn Visser, PA-C  Any Other Special Instructions Will Be Listed Below (If Applicable).  COVID-19 Vaccine Information can be found at: [email or web address] For questions related to vaccine distribution or appointments, please email [email or web address] or call [phone].     

## 2020-02-01 ENCOUNTER — Telehealth: Payer: Self-pay | Admitting: Cardiovascular Disease

## 2020-02-01 NOTE — Telephone Encounter (Signed)
Patient wife states that on patient's AVS it states he has congestive heart failure. Please call to discuss.

## 2020-02-01 NOTE — Telephone Encounter (Signed)
DPR on file. Spoke with the patient wife. Patients wife is inquiring regarding the diagnosis of chronic diastolic heart failure listed on the patients most recent AVS.  Adv her that CHF is not listed on the patients diagnosis list. The patients most recent echo did mention  Left ventricular diastolic Doppler  parameters are consistent with impaired relaxation. The patient EF was normal.  Advised the patients wife that I will fwd the message to Dr. Rockey Situ to clarify. Patients wife voiced appreciation for the call back.

## 2020-02-03 NOTE — Telephone Encounter (Signed)
Diastolic CHF even though mild, he still has it Exacerbation secondary to long smoking history That is why he is on Lasix, without Lasix he would have fluid overload/CHF

## 2020-02-04 NOTE — Telephone Encounter (Signed)
Call to patient to review note from Dr. Rockey Situ.    Pt verbalized understanding and all questions were addressed.    Advised pt to call for any further questions or concerns.  No further orders.

## 2020-02-14 DIAGNOSIS — F419 Anxiety disorder, unspecified: Secondary | ICD-10-CM | POA: Diagnosis not present

## 2020-02-14 DIAGNOSIS — F322 Major depressive disorder, single episode, severe without psychotic features: Secondary | ICD-10-CM | POA: Diagnosis not present

## 2020-02-14 DIAGNOSIS — Z79899 Other long term (current) drug therapy: Secondary | ICD-10-CM | POA: Diagnosis not present

## 2020-02-14 DIAGNOSIS — J449 Chronic obstructive pulmonary disease, unspecified: Secondary | ICD-10-CM | POA: Diagnosis not present

## 2020-02-25 DIAGNOSIS — Z79899 Other long term (current) drug therapy: Secondary | ICD-10-CM | POA: Diagnosis not present

## 2020-02-26 ENCOUNTER — Other Ambulatory Visit: Payer: Self-pay | Admitting: Family

## 2020-02-26 DIAGNOSIS — I25118 Atherosclerotic heart disease of native coronary artery with other forms of angina pectoris: Secondary | ICD-10-CM

## 2020-02-26 DIAGNOSIS — I471 Supraventricular tachycardia: Secondary | ICD-10-CM

## 2020-02-26 DIAGNOSIS — I10 Essential (primary) hypertension: Secondary | ICD-10-CM

## 2020-03-12 ENCOUNTER — Other Ambulatory Visit: Payer: Self-pay | Admitting: Pulmonary Disease

## 2020-03-20 ENCOUNTER — Other Ambulatory Visit: Payer: Self-pay

## 2020-03-20 ENCOUNTER — Encounter: Payer: Self-pay | Admitting: Pulmonary Disease

## 2020-03-20 ENCOUNTER — Ambulatory Visit: Payer: PPO | Admitting: Pulmonary Disease

## 2020-03-20 VITALS — BP 144/82 | HR 72 | Temp 97.7°F | Ht 69.0 in | Wt 174.0 lb

## 2020-03-20 DIAGNOSIS — F17299 Nicotine dependence, other tobacco product, with unspecified nicotine-induced disorders: Secondary | ICD-10-CM | POA: Diagnosis not present

## 2020-03-20 DIAGNOSIS — R42 Dizziness and giddiness: Secondary | ICD-10-CM

## 2020-03-20 DIAGNOSIS — J9611 Chronic respiratory failure with hypoxia: Secondary | ICD-10-CM

## 2020-03-20 DIAGNOSIS — J449 Chronic obstructive pulmonary disease, unspecified: Secondary | ICD-10-CM

## 2020-03-20 MED ORDER — TRELEGY ELLIPTA 100-62.5-25 MCG/INH IN AEPB: 1.0000 | INHALATION_SPRAY | Freq: Every day | RESPIRATORY_TRACT | 0 refills | Status: AC

## 2020-03-20 MED ORDER — MECLIZINE HCL 12.5 MG PO TABS: 12.5000 mg | ORAL_TABLET | Freq: Three times a day (TID) | ORAL | 0 refills | Status: DC | PRN

## 2020-03-20 NOTE — Progress Notes (Signed)
Subjective:    Patient ID: William Mueller, male    DOB: [birth year], 69 y.o.   MRN: [id number]   Requesting MD/Service:Self Date of initial consultation:06/02/20by Dr. Merton Border Reason for consultation:Very severe COPD, former smoker, chronic hypoxemic respiratory failure  PT PROFILE: 69y.o.maleformer smoker (approx 100 p-y history, quit 2011), still vaping occasionally previously seen by Dr Arlana Hove Dr. Alva Garnet.  I assumed his care after Dr. Alva Garnet' departure on 05/24/2019.  DATA: 09/29/17 CTA chest: no PE. No significant emphysema 12/23/15 PFTs: FVC: 2.10 >2.33 L (47>53 %pred), FEV1: 0.91 >1.04 L (27% >31 %pred), FEV1/FVC: 43%, TLC: L ( %pred), DLCO 46 %pred  INTERVAL: Last seen 08/13/2019.  No new complaint since then.  Wants to get off O2.   HPI Doing well since his last visit.  He wonders if he still needs to use his O2.  Recent follow-up with cardiology revealed mild diastolic dysfunction.  He has not had any other issues with regards to his breathing.  Feels that Trelegy is helping him.  He is trying to stay active at home.  Continues to vape but is cutting back on its use.  No fevers, chills or sweats.  Only complaint today is of mild dizziness, he has issues with vertigo.  Has run out of meclizine.  See his primary care provider for another few months.   Review of Systems A 10 point review of systems was performed and it is as noted above otherwise negative.  Allergies  Allergen Reactions  . Paroxetine Hcl Shortness Of Breath and Palpitations  . Serotonin Reuptake Inhibitors (Ssris) Shortness Of Breath and Palpitations  . Diazepam Other (See Comments)    Rapid heartrate  . Doxycycline Monohydrate Other (See Comments)    Unknown allergic reaction  . Escitalopram Oxalate Other (See Comments)    serotonin Syndrome  . Lorazepam Other (See Comments)    Unknown allergic reaction  . Tetracyclines & Related Itching and Rash   Current  Meds  Medication Sig  . acetaminophen (TYLENOL) 500 MG tablet Take 1,000 mg by mouth daily as needed for headache.   . albuterol (PROVENTIL HFA;VENTOLIN HFA) 108 (90 Base) MCG/ACT inhaler Inhale into the lungs every 6 (six) hours as needed for wheezing or shortness of breath.  Marland Kitchen albuterol (PROVENTIL) (2.5 MG/3ML) 0.083% nebulizer solution Take 3 mLs (2.5 mg total) by nebulization every 6 (six) hours as needed for wheezing or shortness of breath.  . ALPRAZolam (XANAX XR) 3 MG 24 hr tablet Take 3 mg by mouth 2 (two) times daily. 1 MG IN THE MORNING AND 3 MG IN THE EVENING  . ALPRAZolam (XANAX) 1 MG tablet Take 1 mg by mouth daily.   Marland Kitchen aspirin 81 MG EC tablet Take 1 tablet (81 mg total) by mouth daily. (Patient taking differently: Take 81 mg by mouth daily. BREAKFAST)  . atorvastatin (LIPITOR) 80 MG tablet Take 1 tablet (80 mg total) by mouth daily.  Marland Kitchen DALIRESP 500 MCG TABS tablet TAKE 1 TABLET BY MOUTH DAILY. IF NEEDED, TRY TO TAKE 1 TABLET EVERY OTHER DAY  . ezetimibe (ZETIA) 10 MG tablet Take 1 tablet (10 mg total) by mouth daily.  . Fluticasone-Umeclidin-Vilant (TRELEGY ELLIPTA) 100-62.5-25 MCG/INH AEPB Inhale 1 puff into the lungs daily.  . furosemide (LASIX) 20 MG tablet Take 20 mg by mouth daily.   . hydrALAZINE (APRESOLINE) 25 MG tablet Take 25 mg by mouth as needed.  Marland Kitchen ipratropium (ATROVENT) 0.06 % nasal spray Place 2 sprays into both nostrils 4 (four)  times daily.  . meclizine (ANTIVERT) 25 MG tablet Take 1 tablet (25 mg total) by mouth 3 (three) times daily as needed for dizziness.  . metoprolol succinate (TOPROL-XL) 100 MG 24 hr tablet TAKE 1 TABLET(100 MG) BY MOUTH DAILY WITH OR IMMEDIATELY FOLLOWING A MEAL  . nitroGLYCERIN (NITROSTAT) 0.4 MG SL tablet Place 1 tablet (0.4 mg total) under the tongue every 5 (five) minutes as needed for chest pain.  Marland Kitchen omeprazole (PRILOSEC) 40 MG capsule Take 40 mg by mouth 2 (two) times daily.  . OXYGEN Inhale 2 L into the lungs daily as needed (oxygen).      Immunization History  Administered Date(s) Administered  . Influenza, Seasonal, Injecte, Preservative Fre 04/08/2015  . Influenza,inj,Quad PF,6+ Mos 03/14/2018  . Influenza-Unspecified 03/10/2016, 05/22/2019  . Janssen (J&J) SARS-COV-2 Vaccination 08/10/2019  . Pneumococcal-Unspecified 02/05/2013       Objective:   Physical Exam BP (!) 144/82 (BP Location: Left Arm, Patient Position: Sitting, Cuff Size: Normal)   Pulse 72   Temp 97.7 F (36.5 C) (Temporal)   Ht 5\' 9"  (1.753 m)   Wt 174 lb (78.9 kg)   SpO2 100%   BMI 25.70 kg/m   GENERAL:Well-developed well-nourished, presents in transport chair with portable oxygen concentrator. Chronic use of accessories. No conversational dyspnea. HEAD: Normocephalic, atraumatic.  EYES: Pupils equal, round, reactive to light. No scleral icterus.  MOUTH:Nose/mouth/throat not examined due to masking requirements for COVID 19.  NECK: Supple. No thyromegaly. Trachea midline. No JVD. No adenopathy. PULMONARY:Distant breath sounds, coarse, no other adventitious sounds.  Amazingly moving air well today.   CARDIOVASCULAR: S1 and S2. Regular rate and rhythm.Distant heart tones, no rubs murmurs or gallops heard. GASTROINTESTINAL:Protuberant abdomen, soft. MUSCULOSKELETAL: No joint deformity, no clubbing, no edema.  NEUROLOGIC:No focal deficits noted. Speech is fluent. SKIN: Intact,warm,dry.At that exam no rashes. PSYCH:Behavior normal.  Ambulatory oximetry: Performed today on room air.  Patient desaturated to 87% with 750 feet ambulation. Continues to qualify for supplemental oxygen.     Assessment & Plan:     ICD-10-CM   1. COPD, very severe ([place])  J44.9    Continue Trelegy Ellipta 100/62.5/25, 1 puff daily Continue efforts to discontinue vaping Stay as active as possible  2. Chronic hypoxemic respiratory failure (HCC)  J96.11    Continue oxygen at 2 L/min Dilatory oximetry performed today, continues to qualify  3. Nicotine  addiction, vaping  F17.299    Patient currently in efforts to discontinue vaping  4. Vertigo  R42    Has occasional need for meclizine Ran out of prescription Refill x1 until he can see primary care   Meds ordered this encounter  Medications  . Fluticasone-Umeclidin-Vilant (TRELEGY ELLIPTA) 100-62.5-25 MCG/INH AEPB    Sig: Inhale 1 puff into the lungs daily for 1 day.    Dispense:  14 each    Refill:  0    Order Specific Question:   Lot Number?    Answer:   [id number]    Order Specific Question:   Expiration Date?    Answer:   08/05/2021    Order Specific Question:   Manufacturer?    Answer:   Gl[employer]Smith[employer] [[id number]]  . Fluticasone-Umeclidin-Vilant (TRELEGY ELLIPTA) 100-62.5-25 MCG/INH AEPB    Sig: Inhale 1 puff into the lungs daily for 1 day.    Dispense:  14 each    Refill:  0    Order Specific Question:   Lot Number?    Answer:   [id number]    Order Specific Question:  Expiration Date?    Answer:   09/05/2021    Order Specific Question:   Manufacturer?    Answer:   Gl[employer]oSmith[employer] [[id number]]  . meclizine (ANTIVERT) 12.5 MG tablet    Sig: Take 1 tablet (12.5 mg total) by mouth 3 (three) times daily as needed for dizziness.    Dispense:  30 tablet    Refill:  0   Discussion:  Patient has severe COPD, he continues to do well with supplemental oxygen and on Trelegy Ellipta.  Continue current regimen.  We will see him in follow-up in 3 months time.  He is to contact us prior to that time should any new difficulties arise.   Renold Don, MD [facility]   *This note was dictated using voice recognition software/Dragon.  Despite best efforts to proofread, errors can occur which can change the meaning.  Any change was purely unintentional.

## 2020-03-20 NOTE — Patient Instructions (Signed)
Continue using your oxygen as you are doing.  Continue Trelegy.  Sent prescription in for your medication for dizziness, make sure you ask your primary provider to keep refilling it.  We will see you in follow-up in 3 months time call sooner should any new problems arise.

## 2020-04-07 DIAGNOSIS — H353211 Exudative age-related macular degeneration, right eye, with active choroidal neovascularization: Secondary | ICD-10-CM | POA: Diagnosis not present

## 2020-04-07 DIAGNOSIS — H34832 Tributary (branch) retinal vein occlusion, left eye, with macular edema: Secondary | ICD-10-CM | POA: Diagnosis not present

## 2020-04-07 DIAGNOSIS — H2512 Age-related nuclear cataract, left eye: Secondary | ICD-10-CM | POA: Diagnosis not present

## 2020-04-22 ENCOUNTER — Telehealth: Payer: Self-pay | Admitting: Cardiovascular Disease

## 2020-04-22 NOTE — Telephone Encounter (Signed)
Patient wife is calling, states patient is signing up for new insurance plan and would like to discuss if patient has Coronary artery disease. Please call.

## 2020-04-22 NOTE — Telephone Encounter (Signed)
Patient and wife needed to know if patient has CAD.  They are looking to switch to another version of their [employer] insurance plan. Advised them he does have CAD. They were appreciative.

## 2020-05-07 ENCOUNTER — Observation Stay: Payer: PPO

## 2020-05-07 ENCOUNTER — Observation Stay: Admit: 2020-05-07 | Payer: PPO

## 2020-05-07 ENCOUNTER — Ambulatory Visit: Payer: PPO | Admitting: Family

## 2020-05-07 ENCOUNTER — Observation Stay
Admission: EM | Admit: 2020-05-07 | Discharge: 2020-05-08 | Disposition: A | Discharge status: Home / Self Care | Payer: PPO | Attending: Internal Medicine | Admitting: Internal Medicine

## 2020-05-07 ENCOUNTER — Encounter: Payer: Self-pay | Admitting: Intensive Care

## 2020-05-07 ENCOUNTER — Other Ambulatory Visit: Payer: Self-pay

## 2020-05-07 ENCOUNTER — Telehealth: Payer: Self-pay | Admitting: Cardiovascular Disease

## 2020-05-07 ENCOUNTER — Emergency Department: Payer: PPO

## 2020-05-07 DIAGNOSIS — R202 Paresthesia of skin: Secondary | ICD-10-CM | POA: Diagnosis present

## 2020-05-07 DIAGNOSIS — I129 Hypertensive chronic kidney disease with stage 1 through stage 4 chronic kidney disease, or unspecified chronic kidney disease: Secondary | ICD-10-CM | POA: Insufficient documentation

## 2020-05-07 DIAGNOSIS — F419 Anxiety disorder, unspecified: Secondary | ICD-10-CM | POA: Diagnosis not present

## 2020-05-07 DIAGNOSIS — Z7951 Long term (current) use of inhaled steroids: Secondary | ICD-10-CM | POA: Insufficient documentation

## 2020-05-07 DIAGNOSIS — I6601 Occlusion and stenosis of right middle cerebral artery: Secondary | ICD-10-CM | POA: Diagnosis not present

## 2020-05-07 DIAGNOSIS — J449 Chronic obstructive pulmonary disease, unspecified: Secondary | ICD-10-CM | POA: Insufficient documentation

## 2020-05-07 DIAGNOSIS — R9431 Abnormal electrocardiogram [ECG] [EKG]: Secondary | ICD-10-CM | POA: Diagnosis not present

## 2020-05-07 DIAGNOSIS — I6503 Occlusion and stenosis of bilateral vertebral arteries: Secondary | ICD-10-CM | POA: Diagnosis not present

## 2020-05-07 DIAGNOSIS — G459 Transient cerebral ischemic attack, unspecified: Secondary | ICD-10-CM | POA: Diagnosis not present

## 2020-05-07 DIAGNOSIS — Z20822 Contact with and (suspected) exposure to covid-19: Secondary | ICD-10-CM | POA: Insufficient documentation

## 2020-05-07 DIAGNOSIS — I25110 Atherosclerotic heart disease of native coronary artery with unstable angina pectoris: Secondary | ICD-10-CM | POA: Diagnosis not present

## 2020-05-07 DIAGNOSIS — I10 Essential (primary) hypertension: Secondary | ICD-10-CM | POA: Diagnosis not present

## 2020-05-07 DIAGNOSIS — Z87891 Personal history of nicotine dependence: Secondary | ICD-10-CM | POA: Diagnosis not present

## 2020-05-07 DIAGNOSIS — I2510 Atherosclerotic heart disease of native coronary artery without angina pectoris: Secondary | ICD-10-CM

## 2020-05-07 DIAGNOSIS — N1832 Chronic kidney disease, stage 3b: Secondary | ICD-10-CM | POA: Diagnosis not present

## 2020-05-07 DIAGNOSIS — I4581 Long QT syndrome: Secondary | ICD-10-CM | POA: Insufficient documentation

## 2020-05-07 DIAGNOSIS — I639 Cerebral infarction, unspecified: Secondary | ICD-10-CM

## 2020-05-07 DIAGNOSIS — Z79899 Other long term (current) drug therapy: Secondary | ICD-10-CM | POA: Diagnosis not present

## 2020-05-07 DIAGNOSIS — Z7982 Long term (current) use of aspirin: Secondary | ICD-10-CM | POA: Diagnosis not present

## 2020-05-07 DIAGNOSIS — Z955 Presence of coronary angioplasty implant and graft: Secondary | ICD-10-CM | POA: Insufficient documentation

## 2020-05-07 DIAGNOSIS — I6523 Occlusion and stenosis of bilateral carotid arteries: Secondary | ICD-10-CM | POA: Diagnosis not present

## 2020-05-07 HISTORY — DX: Cerebral infarction, unspecified: I63.9

## 2020-05-07 LAB — PROTIME-INR
INR: 1 (ref 0.8–1.2)
Prothrombin Time: 12.7 seconds (ref 11.4–15.2)

## 2020-05-07 LAB — CBC WITH DIFFERENTIAL/PLATELET
Abs Immature Granulocytes: 0.02 10*3/uL (ref 0.00–0.07)
Basophils Absolute: 0.1 10*3/uL (ref 0.0–0.1)
Basophils Relative: 1 %
Eosinophils Absolute: 1.5 10*3/uL — ABNORMAL HIGH (ref 0.0–0.5)
Eosinophils Relative: 17 %
HCT: 40.7 % (ref 39.0–52.0)
Hemoglobin: 13.9 g/dL (ref 13.0–17.0)
Immature Granulocytes: 0 %
Lymphocytes Relative: 23 %
Lymphs Abs: 2.1 10*3/uL (ref 0.7–4.0)
MCH: 29 pg (ref 26.0–34.0)
MCHC: 34.2 g/dL (ref 30.0–36.0)
MCV: 85 fL (ref 80.0–100.0)
Monocytes Absolute: 0.7 10*3/uL (ref 0.1–1.0)
Monocytes Relative: 8 %
Neutro Abs: 4.4 10*3/uL (ref 1.7–7.7)
Neutrophils Relative %: 51 %
Platelets: 236 10*3/uL (ref 150–400)
RBC: 4.79 MIL/uL (ref 4.22–5.81)
RDW: 12.1 % (ref 11.5–15.5)
WBC: 8.8 10*3/uL (ref 4.0–10.5)
nRBC: 0 % (ref 0.0–0.2)

## 2020-05-07 LAB — COMPREHENSIVE METABOLIC PANEL
ALT: 16 U/L (ref 0–44)
AST: 17 U/L (ref 15–41)
Albumin: 4.2 g/dL (ref 3.5–5.0)
Alkaline Phosphatase: 114 U/L (ref 38–126)
Anion gap: 10 (ref 5–15)
BUN: 17 mg/dL (ref 8–23)
CO2: 31 mmol/L (ref 22–32)
Calcium: 9.1 mg/dL (ref 8.9–10.3)
Chloride: 98 mmol/L (ref 98–111)
Creatinine, Ser: 1.81 mg/dL — ABNORMAL HIGH (ref 0.61–1.24)
GFR, Estimated: 40 mL/min — ABNORMAL LOW (ref >60)
Glucose, Bld: 98 mg/dL (ref 70–99)
Potassium: 3.4 mmol/L — ABNORMAL LOW (ref 3.5–5.1)
Sodium: 139 mmol/L (ref 135–145)
Total Bilirubin: 0.9 mg/dL (ref 0.3–1.2)
Total Protein: 7.5 g/dL (ref 6.5–8.1)

## 2020-05-07 LAB — RESP PANEL BY RT-PCR (FLU A&B, COVID) ARPGX2
Influenza A by PCR: NEGATIVE
Influenza B by PCR: NEGATIVE
SARS Coronavirus 2 by RT PCR: NEGATIVE

## 2020-05-07 LAB — TROPONIN I (HIGH SENSITIVITY): Troponin I (High Sensitivity): 8 ng/L (ref <18)

## 2020-05-07 MED ORDER — FLUTICASONE-UMECLIDIN-VILANT 100-62.5-25 MCG/INH IN AEPB: 1.0000 | INHALATION_SPRAY | Freq: Every day | RESPIRATORY_TRACT | Status: DC

## 2020-05-07 MED ORDER — ACETAMINOPHEN 325 MG PO TABS: 325.0000 mg | ORAL_TABLET | ORAL | Status: DC | PRN

## 2020-05-07 MED ORDER — POTASSIUM CHLORIDE CRYS ER 20 MEQ PO TBCR
20.0000 meq | EXTENDED_RELEASE_TABLET | Freq: Once | ORAL | Status: AC
  Administered 2020-05-07: 22:00:00 20 meq via ORAL
  Filled 2020-05-07: qty 1

## 2020-05-07 MED ORDER — SODIUM CHLORIDE 0.9 % IV SOLN: INTRAVENOUS | Status: DC

## 2020-05-07 MED ORDER — ROFLUMILAST 500 MCG PO TABS
500.0000 ug | ORAL_TABLET | Freq: Every day | ORAL | Status: DC
  Administered 2020-05-08: 09:00:00 500 ug via ORAL
  Filled 2020-05-07: qty 1

## 2020-05-07 MED ORDER — SENNOSIDES-DOCUSATE SODIUM 8.6-50 MG PO TABS: 1.0000 | ORAL_TABLET | Freq: Every evening | ORAL | Status: DC | PRN

## 2020-05-07 MED ORDER — LABETALOL HCL 5 MG/ML IV SOLN: 5.0000 mg | INTRAVENOUS | Status: DC | PRN

## 2020-05-07 MED ORDER — PANTOPRAZOLE SODIUM 40 MG PO TBEC: 80.0000 mg | DELAYED_RELEASE_TABLET | Freq: Every day | ORAL | Status: DC

## 2020-05-07 MED ORDER — UMECLIDINIUM-VILANTEROL 62.5-25 MCG/INH IN AEPB
1.0000 | INHALATION_SPRAY | Freq: Every day | RESPIRATORY_TRACT | Status: DC
  Administered 2020-05-08: 09:00:00 1 via RESPIRATORY_TRACT
  Filled 2020-05-07: qty 14

## 2020-05-07 MED ORDER — FLUTICASONE FUROATE-VILANTEROL 100-25 MCG/INH IN AEPB
1.0000 | INHALATION_SPRAY | Freq: Every day | RESPIRATORY_TRACT | Status: DC
  Filled 2020-05-07: qty 28

## 2020-05-07 MED ORDER — LORAZEPAM 2 MG/ML IJ SOLN: 2.0000 mg | Freq: Once | INTRAMUSCULAR | Status: DC | PRN

## 2020-05-07 MED ORDER — ASPIRIN EC 81 MG PO TBEC
81.0000 mg | DELAYED_RELEASE_TABLET | Freq: Every day | ORAL | Status: DC
  Administered 2020-05-08: 09:00:00 81 mg via ORAL
  Filled 2020-05-07: qty 1

## 2020-05-07 MED ORDER — ALPRAZOLAM ER 1 MG PO TB24
1.0000 mg | ORAL_TABLET | Freq: Every day | ORAL | Status: DC
  Administered 2020-05-08: 1 mg via ORAL
  Filled 2020-05-07: qty 1

## 2020-05-07 MED ORDER — UMECLIDINIUM BROMIDE 62.5 MCG/INH IN AEPB
1.0000 | INHALATION_SPRAY | Freq: Every day | RESPIRATORY_TRACT | Status: DC
  Filled 2020-05-07: qty 7

## 2020-05-07 MED ORDER — IPRATROPIUM BROMIDE 0.06 % NA SOLN
2.0000 | Freq: Four times a day (QID) | NASAL | Status: DC
  Filled 2020-05-07: qty 15

## 2020-05-07 MED ORDER — IOHEXOL 350 MG/ML SOLN
75.0000 mL | Freq: Once | INTRAVENOUS | Status: AC | PRN
  Administered 2020-05-07: 60 mL via INTRAVENOUS

## 2020-05-07 MED ORDER — CLOPIDOGREL BISULFATE 75 MG PO TABS
75.0000 mg | ORAL_TABLET | Freq: Every day | ORAL | Status: DC
  Administered 2020-05-08: 75 mg via ORAL
  Filled 2020-05-07: qty 1

## 2020-05-07 MED ORDER — FLUTICASONE PROPIONATE HFA 110 MCG/ACT IN AERO
1.0000 | INHALATION_SPRAY | Freq: Every day | RESPIRATORY_TRACT | Status: DC
  Administered 2020-05-08: 1 via RESPIRATORY_TRACT
  Filled 2020-05-07: qty 12

## 2020-05-07 MED ORDER — ATORVASTATIN CALCIUM 20 MG PO TABS
80.0000 mg | ORAL_TABLET | Freq: Every day | ORAL | Status: DC
  Administered 2020-05-07: 80 mg via ORAL
  Filled 2020-05-07: qty 4

## 2020-05-07 MED ORDER — NICARDIPINE HCL 20 MG PO CAPS
20.0000 mg | ORAL_CAPSULE | Freq: Three times a day (TID) | ORAL | Status: DC | PRN
  Filled 2020-05-07: qty 1

## 2020-05-07 MED ORDER — ACETAMINOPHEN 160 MG/5ML PO SOLN
325.0000 mg | ORAL | Status: DC | PRN
  Filled 2020-05-07: qty 20.3

## 2020-05-07 MED ORDER — ALPRAZOLAM ER 1 MG PO TB24: 1.0000 mg | ORAL_TABLET | Freq: Three times a day (TID) | ORAL | Status: DC | PRN

## 2020-05-07 MED ORDER — ALBUTEROL SULFATE (2.5 MG/3ML) 0.083% IN NEBU: 2.5000 mg | INHALATION_SOLUTION | Freq: Four times a day (QID) | RESPIRATORY_TRACT | Status: DC | PRN

## 2020-05-07 MED ORDER — EZETIMIBE 10 MG PO TABS
10.0000 mg | ORAL_TABLET | Freq: Every day | ORAL | Status: DC
  Administered 2020-05-07: 10 mg via ORAL
  Filled 2020-05-07 (×2): qty 1

## 2020-05-07 MED ORDER — STROKE: EARLY STAGES OF RECOVERY BOOK: Freq: Once | Status: AC

## 2020-05-07 MED ORDER — ACETAMINOPHEN 650 MG RE SUPP: 325.0000 mg | RECTAL | Status: DC | PRN

## 2020-05-07 MED ORDER — ALBUTEROL SULFATE HFA 108 (90 BASE) MCG/ACT IN AERS
2.0000 | INHALATION_SPRAY | Freq: Four times a day (QID) | RESPIRATORY_TRACT | Status: DC | PRN
  Filled 2020-05-07: qty 6.7

## 2020-05-07 MED ORDER — ALPRAZOLAM ER 1 MG PO TB24
3.0000 mg | ORAL_TABLET | Freq: Every day | ORAL | Status: DC
  Administered 2020-05-07: 3 mg via ORAL
  Filled 2020-05-07: qty 3

## 2020-05-07 NOTE — Telephone Encounter (Signed)
Spoke with patients wife per release form and she states that they thought he had a stroke last night. Part of tongue was numb as well as fingers and hand. Loss of balance but no vital signs. This started in right leg with weakness going up to left side to his jaw, face, fingers, and hand. His balance was off and she states that he did have facial drooping. He denied any shortness of breath and could not recall if he had any chest pain. Offered appointment today with APP here in office. Wife reported Right side then Left side so her descriptions were not clear as to side. Patient in back ground and states that it went away last night and no residual after effects. They accepted appointment and verbalized understanding to arrive early to allow time to get to office. Patients wife verbalized understanding of our conversation and had no further questions at this time.  Will send note to provider so she is aware of him coming today.

## 2020-05-07 NOTE — ED Provider Notes (Signed)
[facility] Emergency Department Provider Note ____________________________________________   First MD Initiated Contact with Patient 05/07/20 1211     (approximate)  I have reviewed the triage vital signs and the nursing notes.   HISTORY  Chief Complaint Numbness and Weakness    HPI William Mueller is a 69 y.o. male with PMH as noted below including CKD, CAD s/p MI and COPD on home O2 who presents with right sided numbness, acute onset around midnight last night and now resolved.  The patient states he fell asleep around 10pm, and when he awoke around midnight he had numbness to the right side of his face and neck, and could not feel his right hand.  He did not have any numbness in the leg.  He then had a pain that went down his right side, down the leg, and then up the leg on the left.  He got up to go to the bathroom and had to steady himself because he felt off balance, but he could walk.  He had no vision or speech disturbance.  He is unsure when the symptoms resolved.  He states he still feels a bit generally weak.   He has not had any prior episodes like this, although he did have a 30 minute episode of aphasia some time ago (he does not remember exactly when).   Past Medical History:  Diagnosis Date  . Anginal pain ([place]) 06/2017  . Anxiety   . Bronchitis 06/2017  . Chronic chest pain   . Chronic kidney disease (CKD) stage G3b/A1, moderately decreased glomerular filtration rate (GFR) between 30-44 mL/min/1.73 square meter and albuminuria creatinine ratio less than 30 mg/g (HCC)   . Chronic lower back pain    WITH LEG WEAKNESS  . COPD (chronic obstructive pulmonary disease) (HCC)    EMPHYSEMA, O2 AT 2L PRN  . Coronary artery disease    a. 12/2013 PCI: mRCA 100% with L to R collats s/p PCI/DES;  b. 06/2014 MV: no ischemia/infarct, EF 53%;  c. 03/2015 Cath: LM nl, LAD nl, D1 80 (1.21mm), LCX 70m (<1.26mm), OM1 nl, RCA 55p, patent stent, RPDA nl, EF 65%; d.  09/2015 Cath: LM nl, LAD 70m, D1 80 (<28mm), LCX 2m/d (<1.39mm), OM1 nl, RCA 55p (FFR 0.93), patent stent, RPDA nl-->Med Rx.  . Daily headache   . Depression   . Diastolic dysfunction    a. echo 12/2013: EF 55-60%, mild LVH, GR1DD, inf HK, elevated CVP, mildly dilated IVC suggestive of increased RA pressure  . Dyspnea    WHEEZING  . GERD (gastroesophageal reflux disease)    REFLUX  . Hearing loss   . High cholesterol   . Hypertension   . Myocardial infarction ([place]) 2015  . Nicotine addiction    a. using eCigs.  . Orthopnea   . Sleep apnea   . Vertigo     Patient Active Problem List   Diagnosis Date Noted  . Chest pain 09/30/2017  . Hypokalemia 09/29/2017  . Near syncope 09/29/2017  . Orthostatic hypotension 09/29/2017  . Nicotine addiction   . CAD S/P PCI DES to RCA 03/11/2015  . Diastolic dysfunction   . Chronic chest pain   . Chest pain at rest 11/23/2014  . GERD (gastroesophageal reflux disease) 06/08/2014  . CKD (chronic kidney disease) stage 3, GFR 30-59 ml/min (HCC) 05/04/2014  . Bilateral leg pain 05/04/2014  . Lung nodule < 6cm on CT 05/04/2014  . Abnormal CT scan, kidney 05/04/2014  . Unstable  angina ([place]) 05/04/2014  . Atrial tachycardia, paroxysmal ([place]) 12/14/2013  . Atherosclerosis of native coronary artery with unstable angina pectoris ([place]) 12/13/2013  . Labile essential hypertension 12/11/2013  . Acute renal failure superimposed on stage 3 chronic kidney disease ([place]) 12/11/2013  . Hyperlipidemia 12/11/2013  . COPD (chronic obstructive pulmonary disease) ([place]) 12/11/2013    Past Surgical History:  Procedure Laterality Date  . CARDIAC CATHETERIZATION     MC x 1 stent  . CARDIAC CATHETERIZATION Left 03/12/2015   Procedure: Left Heart Cath and Coronary Angiography;  Surgeon: Leonie Man, MD;  Location: [place] [facility];  Service: Cardiovascular;  Laterality: Left;  . CARDIAC CATHETERIZATION N/A 09/29/2015   Procedure: Left Heart Cath and Coronary  Angiography;  Surgeon: Wellington Hampshire, MD;  Location: [place] [facility];  Service: Cardiovascular;  Laterality: N/A;  . CATARACT EXTRACTION W/PHACO Right 08/10/2017   Procedure: CATARACT EXTRACTION PHACO AND INTRAOCULAR LENS PLACEMENT ([place]) RIGHT;  Surgeon: Leandrew Koyanagi, MD;  Location: [facility];  Service: Ophthalmology;  Laterality: Right;  . CORONARY ANGIOPLASTY     STENT PLACEMENT  . LEFT HEART CATH AND CORONARY ANGIOGRAPHY N/A 09/06/2018   Procedure: LEFT HEART CATH AND CORONARY ANGIOGRAPHY;  Surgeon: Minna Merritts, MD;  Location: [place] [facility];  Service: Cardiovascular;  Laterality: N/A;  . LEFT HEART CATHETERIZATION WITH CORONARY ANGIOGRAM N/A 12/12/2013   Procedure: LEFT HEART CATHETERIZATION WITH CORONARY ANGIOGRAM;  Surgeon: Wellington Hampshire, MD;  Location: [place] [facility];  Service: Cardiovascular;  Laterality: N/A;    Prior to Admission medications   Medication Sig Start Date End Date Taking? Authorizing Provider  acetaminophen (TYLENOL) 500 MG tablet Take 1,000 mg by mouth daily as needed for headache.     [provider]  albuterol (PROVENTIL HFA;VENTOLIN HFA) 108 (90 Base) MCG/ACT inhaler Inhale into the lungs every 6 (six) hours as needed for wheezing or shortness of breath.    [provider]  albuterol (PROVENTIL) (2.5 MG/3ML) 0.083% nebulizer solution Take 3 mLs (2.5 mg total) by nebulization every 6 (six) hours as needed for wheezing or shortness of breath. 05/24/19   Tyler Pita, MD  ALPRAZolam (XANAX XR) 3 MG 24 hr tablet Take 3 mg by mouth 2 (two) times daily. 1 MG IN THE MORNING AND 3 MG IN THE EVENING    [provider]  ALPRAZolam (XANAX) 1 MG tablet Take 1 mg by mouth daily.  02/15/18   [provider]  aspirin 81 MG EC tablet Take 1 tablet (81 mg total) by mouth daily. Patient taking differently: Take 81 mg by mouth daily. BREAKFAST 09/04/15   Minna Merritts, MD  atorvastatin (LIPITOR) 80 MG tablet Take  1 tablet (80 mg total) by mouth daily. 07/23/19   Loel Dubonnet, NP  DALIRESP 500 MCG TABS tablet TAKE 1 TABLET BY MOUTH DAILY. IF NEEDED, TRY TO TAKE 1 TABLET EVERY OTHER DAY 03/12/20   Tyler Pita, MD  ezetimibe (ZETIA) 10 MG tablet Take 1 tablet (10 mg total) by mouth daily. 07/23/19   Loel Dubonnet, NP  Fluticasone-Umeclidin-Vilant (TRELEGY ELLIPTA) 100-62.5-25 MCG/INH AEPB Inhale 1 puff into the lungs daily. 12/25/19   Tyler Pita, MD  furosemide (LASIX) 20 MG tablet Take 20 mg by mouth daily.     [provider]  hydrALAZINE (APRESOLINE) 25 MG tablet Take 25 mg by mouth as needed.    [provider]  ipratropium (ATROVENT) 0.06 % nasal spray Place 2 sprays  into both nostrils 4 (four) times daily. 08/13/19   Tyler Pita, MD  meclizine (ANTIVERT) 12.5 MG tablet Take 1 tablet (12.5 mg total) by mouth 3 (three) times daily as needed for dizziness. 03/20/20   Tyler Pita, MD  metoprolol succinate (TOPROL-XL) 100 MG 24 hr tablet TAKE 1 TABLET(100 MG) BY MOUTH DAILY WITH OR IMMEDIATELY FOLLOWING A MEAL 02/26/20   Loel Dubonnet, NP  nitroGLYCERIN (NITROSTAT) 0.4 MG SL tablet Place 1 tablet (0.4 mg total) under the tongue every 5 (five) minutes as needed for chest pain. 01/16/18   Minna Merritts, MD  omeprazole (PRILOSEC) 40 MG capsule Take 40 mg by mouth 2 (two) times daily. 01/16/20   [provider]  OXYGEN Inhale 2 L into the lungs daily as needed (oxygen).     [provider]    Allergies Paroxetine hcl, Serotonin reuptake inhibitors (ssris), Diazepam, Doxycycline monohydrate, Escitalopram oxalate, Lorazepam, and Tetracyclines & related  Family History  Problem Relation Age of Onset  . Coronary artery disease Brother   . Heart disease Brother 24  . CVA Mother   . Heart disease Father        MI x 8  . Coronary artery disease Father   . Kidney disease Neg Hx   . Prostate cancer Neg Hx     Social History Social History    Tobacco Use  . Smoking status: Former Smoker    Years: 100.00    Types: E-cigarettes    Quit date: 06/07/2009    Years since quitting: 10.9  . Smokeless tobacco: Never Used  . Tobacco comment: Using electronic cigarette  Vaping Use  . Vaping Use: Former  Substance Use Topics  . Alcohol use: Yes    Alcohol/week: 6.0 standard drinks    Types: 6 Cans of beer per week    Comment: Occasional   . Drug use: No    Review of Systems  Constitutional: No fever. Eyes: No visual changes. ENT: No sore throat. Cardiovascular: Denies chest pain. Respiratory: Denies shortness of breath. Gastrointestinal: No vomiting.  Genitourinary: Negative for dysuria.  Musculoskeletal: Negative for back pain. Skin: Negative for rash. Neurological: Negative for headache.   ____________________________________________   PHYSICAL EXAM:  VITAL SIGNS: ED Triage Vitals  Enc Vitals Group     BP 05/07/20 1200 (!) 178/87     Pulse Rate 05/07/20 1200 76     Resp 05/07/20 1200 16     Temp 05/07/20 1158 99 F (37.2 C)     Temp Source 05/07/20 1158 Oral     SpO2 05/07/20 1200 100 %     Weight 05/07/20 1159 175 lb (79.4 kg)     Height 05/07/20 1159 5\' 9"  (1.753 m)     Head Circumference --      Peak Flow --      Pain Score 05/07/20 1158 0     Pain Loc --      Pain Edu? --      Excl. in [place]? --     Constitutional: Alert and oriented. Slightly anxious appearing, but in no acute distress. Eyes: Conjunctivae are normal. EOMI. PERRLA.  Head: Atraumatic. Nose: No congestion/rhinnorhea. Mouth/Throat: Mucous membranes are moist.   Neck: Normal range of motion.  Cardiovascular: Normal rate, regular rhythm.  Good peripheral circulation. Respiratory: Normal respiratory effort.  No retractions.  Gastrointestinal: No distention.  Musculoskeletal: No lower extremity edema.  Extremities warm and well perfused.  Neurologic:  Normal speech and language. 5/5 motor  strength and intact sensation to all  extremities.  No facial droop.  No pronator drift.  Normal coordination with no ataxia on finger to nose.   Skin:  Skin is warm and dry. No rash noted. Psychiatric: Mood and affect are normal. Speech and behavior are normal.  ____________________________________________   LABS (all labs ordered are listed, but only abnormal results are displayed)  Labs Reviewed  COMPREHENSIVE METABOLIC PANEL - Abnormal; Notable for the following components:      Result Value   Potassium 3.4 (*)    Creatinine, Ser 1.81 (*)    GFR, Estimated 40 (*)    All other components within normal limits  CBC WITH DIFFERENTIAL/PLATELET - Abnormal; Notable for the following components:   Eosinophils Absolute 1.5 (*)    All other components within normal limits  RESP PANEL BY RT-PCR (FLU A&B, COVID) ARPGX2  PROTIME-INR  TROPONIN I (HIGH SENSITIVITY)   ____________________________________________  EKG  ED ECG REPORT I, Arta Silence, the attending physician, personally viewed and interpreted this ECG.  Date: 05/07/2020 EKG Time: 1322 Rate: 63 Rhythm: normal sinus rhythm QRS Axis: normal Intervals: Prolonged QTc ST/T Wave abnormalities: Nonspecific repolarization abnormality Narrative Interpretation: no evidence of acute ischemia ____________________________________________  RADIOLOGY  CT head: Left frontal lobe infarct, possibly subacute  ____________________________________________   PROCEDURES  Procedure(s) performed: No  Procedures  Critical Care performed: No ____________________________________________   INITIAL IMPRESSION / ASSESSMENT AND PLAN / ED COURSE  Pertinent labs & imaging results that were available during my care of the patient were reviewed by me and considered in my medical decision making (see chart for details).  69 year old male with PMH as noted above presents with acute onset of right facial and arm numbness last night around midnight, now resolved, and  associated with loss of balance.  I reviewed the past medical records in Epic; the patient has no recent prior ED visits or admissions. He follows with Dr. Rockey Situ from cardiology for CAD.  He is on aspirin but no other antiplatelet or anticoagulation.  On exam the patient is overall well appearing.  His vital signs are normal except for hypertension.  Neuro exam is nonfocal.  Overall presentation is most concerning for TIA versus acute stroke although the patient does not have any ongoing deficits.  Differential includes less likely radiculopathy/peripheral neuropathy although this would not explain the numbness in the face.  We will obtain CT head, lab workup, neuro consult, reassess.   ----------------------------------------- 2:26 PM on 05/07/2020 -----------------------------------------  CT shows a possible subacute infarct.  I consulted Dr. Cheral Marker from neurology who agrees with admission for further stroke work-up.  I then discussed the case with Dr. Tobie Poet from the hospitalist service.  ____________________________  William Mueller was evaluated in Emergency Department on 05/07/2020 for the symptoms described in the history of present illness. He was evaluated in the context of the global COVID-19 pandemic, which necessitated consideration that the patient might be at risk for infection with the SARS-CoV-2 virus that causes COVID-19. Institutional protocols and algorithms that pertain to the evaluation of patients at risk for COVID-19 are in a state of rapid change based on information released by regulatory bodies including the CDC and federal and state organizations. These policies and algorithms were followed during the patient's care in the ED.  ____________________________________________   FINAL CLINICAL IMPRESSION(S) / ED DIAGNOSES  Final diagnoses:  TIA (transient ischemic attack)      NEW MEDICATIONS STARTED DURING THIS VISIT:  New Prescriptions  No medications on file      Note:  This document was prepared using Dragon voice recognition software and may include unintentional dictation errors.   Arta Silence, MD 05/07/20 1429

## 2020-05-07 NOTE — Telephone Encounter (Signed)
Due to numbness, loss of balance, weakness he will need to be evaluated in the ED due to concern for stroke. He will need imaging which we cannot complete in the office. Please inform patient to present to ED for evaluation.   Loel Dubonnet, NP

## 2020-05-07 NOTE — ED Triage Notes (Signed)
Patient c/o waking up around midnight last night feeling numbness/weakness in right side of face, right hand and right leg. Denies pain. Also reports cp X1 month or longer that lasts a few minutes and then subsides.

## 2020-05-07 NOTE — H&P (Signed)
History and Physical   William Mueller TXH:[id number] DOB: 1950/06/29 DOA: 05/07/2020  PCP: Philmore Pali, NP  Outpatient Specialists: [facility], Dr. Rockey Situ Patient coming from: home via private vehicle.  I have personally briefly reviewed patient's old medical records in [place].  Chief Concern: Right-sided numbness and tingling  HPI: William Mueller is a 69 y.o. male with medical history significant for COPD,   Woke up around midnight with numbness in right side of face and right hand and fingers. HE was not able to feel fingers. He was not sure how long the smyptoms started.   He fell asleep and woke up when it was daylight. He also endorsed right neck pain, right arm pain down is right leg and then up his left leg that started about 2 AM.   He reports this has never happened before.   ROS: denies headache, fever, new cough, vision changes, dysphagia, odynophagia, nausea, vomiting, chest pain, new shortness of breath, abdominal pain, dysuria, hematuria, diarrhea, constipation, blood ins tool. He states his last BM was prior to ED arrival.   Social history: lives with spouse. Formerly, worked as a Engineer, civil (consulting), making brinks. Quit vaping 3 weeks ago. Quit tobacco January 2011 (2-3 ppd). Endorses infrequent etoh use (last drink was 3-4 weeks ago). Denies recreational drug use.  He and spouse endorsed that approximately 6 months ago he had an episode of dysphagia and it lasted 30 minutes and went away. He states that during that episode, he knew what he wanted to say, but he was unable to say it.  ED Course: Discussed with ED provider, requesting admission for stroke work-up.  CT the head without contrast in the emergency room was read as infarct in the inferior left frontal lobe is new since prior head CT.  The infarct could not be definitively characterized but it appears subacute to late subacute.  Chronic microvascular ischemic changes progressed since the prior exam.   Cortical atrophy noted.  Vital signs in the ED were: Temperature 99 F, respiration rate 17, heart rate 57-76, BP 170/71.  SPO2 of 100% on 2 L and not using accessory muscles.  Review of Systems: As per HPI otherwise 10 point review of systems negative.  Assessment/Plan  Active Problems:   TIA (transient ischemic attack)   TIA/stroke work-up History of expressive aphasia- -Neurology was consulted -Lipid panel and A1c check -Complete echo -MRI of the brain without contrast -CTA of the head and neck  History of hypertension-holding home medications at this time to allow for modified permissive hypertension  Modified permissive hypertension-Per neurology -Treat if SBP greater than 180, after 24 hours correct by 50 %/day to a SBP goal of 120-140 -Labetalol 5 mg injection IV every 2 hours as needed for SBP greater than 180 for 1 day -Primary team to resume antihypertensives as appropriate  Anxiety-Ativan 2 mg IV once as needed prior to transport to MRI has been ordered  Prolonged QT-present admission and present on previous EKG, outpatient follow-up with cardiologist as recommended  Hyperlipidemia-resumed home atorvastatin 80 mg nightly, as ezetimide 10 mg nightly  Hypertension-allow for permissive hypertension at this time  Anxiety-resumed home alprazolam 1 mg in the morning and 3 mg in the evening  GERD-resumed home pantoprazole 80 mg  COPD-resumed home trelegy 1 puff daily, Daliresp daily, ipratropium nasal spray 4 times daily daily, albuterol every 6 hours as needed for shortness of breath, albuterol nebulizer every 6 hours as needed for shortness of breath  Hypokalemia-replace  Chart reviewed.   DVT prophylaxis: SCDs Code Status: FULL code Diet: Heart healthy diet Family Communication: updated spouse at bedside Disposition Plan: Pending clinical course Consults called: Neurology Admission status: Observation with telemetry  Past Medical History:  Diagnosis Date  .  Anginal pain ([place]) 06/2017  . Anxiety   . Bronchitis 06/2017  . Chronic chest pain   . Chronic kidney disease (CKD) stage G3b/A1, moderately decreased glomerular filtration rate (GFR) between 30-44 mL/min/1.73 square meter and albuminuria creatinine ratio less than 30 mg/g (HCC)   . Chronic lower back pain    WITH LEG WEAKNESS  . COPD (chronic obstructive pulmonary disease) (HCC)    EMPHYSEMA, O2 AT 2L PRN  . Coronary artery disease    a. 12/2013 PCI: mRCA 100% with L to R collats s/p PCI/DES;  b. 06/2014 MV: no ischemia/infarct, EF 53%;  c. 03/2015 Cath: LM nl, LAD nl, D1 80 (1.42mm), LCX 72m (<1.93mm), OM1 nl, RCA 55p, patent stent, RPDA nl, EF 65%; d. 09/2015 Cath: LM nl, LAD 48m, D1 80 (<67mm), LCX 53m/d (<1.90mm), OM1 nl, RCA 55p (FFR 0.93), patent stent, RPDA nl-->Med Rx.  . Daily headache   . Depression   . Diastolic dysfunction    a. echo 12/2013: EF 55-60%, mild LVH, GR1DD, inf HK, elevated CVP, mildly dilated IVC suggestive of increased RA pressure  . Dyspnea    WHEEZING  . GERD (gastroesophageal reflux disease)    REFLUX  . Hearing loss   . High cholesterol   . Hypertension   . Myocardial infarction ([place]) 2015  . Nicotine addiction    a. using eCigs.  . Orthopnea   . Sleep apnea   . Vertigo    Past Surgical History:  Procedure Laterality Date  . CARDIAC CATHETERIZATION     MC x 1 stent  . CARDIAC CATHETERIZATION Left 03/12/2015   Procedure: Left Heart Cath and Coronary Angiography;  Surgeon: Leonie Man, MD;  Location: [facility];  Service: Cardiovascular;  Laterality: Left;  . CARDIAC CATHETERIZATION N/A 09/29/2015   Procedure: Left Heart Cath and Coronary Angiography;  Surgeon: Wellington Hampshire, MD;  Location: [facility];  Service: Cardiovascular;  Laterality: N/A;  . CATARACT EXTRACTION W/PHACO Right 08/10/2017   Procedure: CATARACT EXTRACTION PHACO AND INTRAOCULAR LENS PLACEMENT ([place]) RIGHT;  Surgeon: Leandrew Koyanagi, MD;  Location: [facility];  Service: Ophthalmology;  Laterality: Right;  . CORONARY ANGIOPLASTY     STENT PLACEMENT  . LEFT HEART CATH AND CORONARY ANGIOGRAPHY N/A 09/06/2018   Procedure: LEFT HEART CATH AND CORONARY ANGIOGRAPHY;  Surgeon: Minna Merritts, MD;  Location: [facility];  Service: Cardiovascular;  Laterality: N/A;  . LEFT HEART CATHETERIZATION WITH CORONARY ANGIOGRAM N/A 12/12/2013   Procedure: LEFT HEART CATHETERIZATION WITH CORONARY ANGIOGRAM;  Surgeon: Wellington Hampshire, MD;  Location: [facility];  Service: Cardiovascular;  Laterality: N/A;   Social History:  reports that he quit smoking about 10 years ago. His smoking use included e-cigarettes. He quit after 100.00 years of use. He has never used smokeless tobacco. He reports current alcohol use of about 6.0 standard drinks of alcohol per week. He reports that he does not use drugs.  Allergies  Allergen Reactions  . Paroxetine Hcl Shortness Of Breath and Palpitations  . Serotonin Reuptake Inhibitors (Ssris) Shortness Of Breath and Palpitations  . Diazepam Other (See Comments)    Rapid heartrate  . Doxycycline Monohydrate Other (See Comments)    Unknown  allergic reaction  . Escitalopram Oxalate Other (See Comments)    serotonin Syndrome  . Lorazepam Other (See Comments)    Unknown allergic reaction  . Tetracyclines & Related Itching and Rash   Family History  Problem Relation Age of Onset  . Coronary artery disease Brother   . Heart disease Brother 18  . CVA Mother   . Heart disease Father        MI x 8  . Coronary artery disease Father   . Kidney disease Neg Hx   . Prostate cancer Neg Hx    Family history: Family history reviewed and CVA in mother in her 61s.  Prior to Admission medications   Medication Sig Start Date End Date Taking? Authorizing Provider  acetaminophen (TYLENOL) 500 MG tablet Take 1,000 mg by mouth daily as needed for headache.     [provider]  albuterol (PROVENTIL HFA;VENTOLIN HFA) 108 (90  Base) MCG/ACT inhaler Inhale into the lungs every 6 (six) hours as needed for wheezing or shortness of breath.    [provider]  albuterol (PROVENTIL) (2.5 MG/3ML) 0.083% nebulizer solution Take 3 mLs (2.5 mg total) by nebulization every 6 (six) hours as needed for wheezing or shortness of breath. 05/24/19   Tyler Pita, MD  ALPRAZolam (XANAX XR) 3 MG 24 hr tablet Take 3 mg by mouth 2 (two) times daily. 1 MG IN THE MORNING AND 3 MG IN THE EVENING    [provider]  ALPRAZolam (XANAX) 1 MG tablet Take 1 mg by mouth daily.  02/15/18   [provider]  aspirin 81 MG EC tablet Take 1 tablet (81 mg total) by mouth daily. Patient taking differently: Take 81 mg by mouth daily. BREAKFAST 09/04/15   Minna Merritts, MD  atorvastatin (LIPITOR) 80 MG tablet Take 1 tablet (80 mg total) by mouth daily. 07/23/19   Loel Dubonnet, NP  DALIRESP 500 MCG TABS tablet TAKE 1 TABLET BY MOUTH DAILY. IF NEEDED, TRY TO TAKE 1 TABLET EVERY OTHER DAY 03/12/20   Tyler Pita, MD  ezetimibe (ZETIA) 10 MG tablet Take 1 tablet (10 mg total) by mouth daily. 07/23/19   Loel Dubonnet, NP  Fluticasone-Umeclidin-Vilant (TRELEGY ELLIPTA) 100-62.5-25 MCG/INH AEPB Inhale 1 puff into the lungs daily. 12/25/19   Tyler Pita, MD  furosemide (LASIX) 20 MG tablet Take 20 mg by mouth daily.     [provider]  hydrALAZINE (APRESOLINE) 25 MG tablet Take 25 mg by mouth as needed.    [provider]  ipratropium (ATROVENT) 0.06 % nasal spray Place 2 sprays into both nostrils 4 (four) times daily. 08/13/19   Tyler Pita, MD  meclizine (ANTIVERT) 12.5 MG tablet Take 1 tablet (12.5 mg total) by mouth 3 (three) times daily as needed for dizziness. 03/20/20   Tyler Pita, MD  metoprolol succinate (TOPROL-XL) 100 MG 24 hr tablet TAKE 1 TABLET(100 MG) BY MOUTH DAILY WITH OR IMMEDIATELY FOLLOWING A MEAL 02/26/20   Loel Dubonnet, NP  nitroGLYCERIN (NITROSTAT) 0.4 MG SL  tablet Place 1 tablet (0.4 mg total) under the tongue every 5 (five) minutes as needed for chest pain. 01/16/18   Minna Merritts, MD  omeprazole (PRILOSEC) 40 MG capsule Take 40 mg by mouth 2 (two) times daily. 01/16/20   [provider]  OXYGEN Inhale 2 L into the lungs daily as needed (oxygen).     [provider]   Physical Exam: Vitals:   05/07/20  1158 05/07/20 1159 05/07/20 1200  BP:   (!) 178/87  Pulse:   76  Resp:   16  Temp: 99 F (37.2 C)    TempSrc: Oral    SpO2:   100%  Weight:  79.4 kg   Height:  5\' 9"  (1.753 m)    Constitutional: appears age-appropriate, NAD, calm, comfortable Eyes: PERRL, lids and conjunctivae normal ENMT: Mucous membranes are moist. Posterior pharynx clear of any exudate or lesions. Age-appropriate dentition. Hearing loss Neck: normal, supple, no masses, no thyromegaly Respiratory: clear to auscultation bilaterally, no wheezing, no crackles. Normal respiratory effort. No accessory muscle use.  Cardiovascular: Regular rate and rhythm, no murmurs / rubs / gallops. No extremity edema. 2+ pedal pulses. No carotid bruits.  Abdomen: no tenderness, no masses palpated, no hepatosplenomegaly. Bowel sounds positive.  Musculoskeletal: no clubbing / cyanosis. No joint deformity upper and lower extremities. Good ROM, no contractures, no atrophy. Normal muscle tone. Strength L > R, this has been going on for 6 months now Skin: no rashes, lesions, ulcers. No induration Neurologic: Sensation intact. Strength 5/5 in all 4.  Psychiatric: Normal judgment and insight. Alert and oriented x 3. Normal mood.   EKG: Independently reviewed, showing sinus rhythm with a rate of 63, QTC 612.  QTc 519 on EKG and 09/06/2018.  Imaging on Admission: Personally reviewed and I agree with radiologist reading as below.  CT Head Wo Contrast  Result Date: 05/07/2020 CLINICAL DATA:  Numbness in the right side of the face, right hand and leg last night. EXAM: CT HEAD  WITHOUT CONTRAST TECHNIQUE: Contiguous axial images were obtained from the base of the skull through the vertex without intravenous contrast. COMPARISON:  Head CT 05/15/2018. FINDINGS: Brain: Since the prior examination, the patient has suffered an infarct in the inferior left frontal lobe. The infarct cannot be definitively characterized but appears subacute to late subacute. Chronic microvascular ischemic change has progressed. Cortical atrophy again seen. No hemorrhage, mass lesion, midline shift, hydrocephalus or pneumocephalus. Vascular: No hyperdense vessel or unexpected calcification. Skull: Intact.  No focal lesion. Sinuses/Orbits: Status post cataract surgery on the right. Otherwise unremarkable. Other: None. IMPRESSION: Infarct in the inferior left frontal lobe is new since the prior head CT. The infarct can not be definitively characterized but it appears subacute to late subacute. Chronic microvascular ischemic changes progressed since the prior exam. Cortical atrophy noted. Electronically Signed   By: Inge Rise M.D.   On: 05/07/2020 12:58   Labs on Admission: I have personally reviewed following labs  CBC: Recent Labs  Lab 05/07/20 1237  WBC 8.8  NEUTROABS 4.4  HGB 13.9  HCT 40.7  MCV 85.0  PLT 093   Basic Metabolic Panel: Recent Labs  Lab 05/07/20 1237  NA 139  K 3.4*  CL 98  CO2 31  GLUCOSE 98  BUN 17  CREATININE 1.81*  CALCIUM 9.1   GFR: Estimated Creatinine Clearance: 38 mL/min (A) (by C-G formula based on SCr of 1.81 mg/dL (H)). Liver Function Tests: Recent Labs  Lab 05/07/20 1237  AST 17  ALT 16  ALKPHOS 114  BILITOT 0.9  PROT 7.5  ALBUMIN 4.2   Coagulation Profile: Recent Labs  Lab 05/07/20 1237  INR 1.0   Urine analysis:    Component Value Date/Time   COLORURINE STRAW (A) 02/23/2016 1722   APPEARANCEUR Clear 09/28/2017 0822   LABSPEC 1.005 02/23/2016 1722   LABSPEC 1.002 04/21/2013 1022   PHURINE 6.0 02/23/2016 1722   GLUCOSEU  Negative  09/28/2017 0822   GLUCOSEU Negative 04/21/2013 1022   HGBUR NEGATIVE 02/23/2016 1722   BILIRUBINUR Negative 09/28/2017 0822   BILIRUBINUR Negative 04/21/2013 1022   KETONESUR NEGATIVE 02/23/2016 1722   PROTEINUR Negative 09/28/2017 0822   PROTEINUR NEGATIVE 02/23/2016 1722   UROBILINOGEN 0.2 11/22/2014 2213   NITRITE Negative 09/28/2017 0822   NITRITE NEGATIVE 02/23/2016 1722   LEUKOCYTESUR Negative 09/28/2017 0822   LEUKOCYTESUR Negative 04/21/2013 1022   Armonte Tortorella N Shanetra Blumenstock D.O. [facility]  If 12AM-7AM, please contact overnight-coverage provider If 7AM-7PM, please contact day coverage provider [email or web address]  05/07/2020, 2:29 PM

## 2020-05-07 NOTE — Telephone Encounter (Signed)
Spoke with patients wife per release form and advised that he needs to go to ED for further evaluation and rule out stroke. Patient states that he will not go to ED and expressed the urgency of this situation. Advised that I will call the Charge Nurse and make her aware that they will be coming in. She verbalized understanding with no further questions at this time.   Called ED Charge nurse Nira Conn and reviewed information with her. No further needs at this time.

## 2020-05-07 NOTE — Telephone Encounter (Signed)
Patient wife states patient thinks he had a stroke last night. States he become numb on his right side and the sensation went all the way down to his feet and came back and stopped at his heart.  States this morning "he just doesn't feel right" She states his BP is 134/75.

## 2020-05-07 NOTE — Consult Note (Signed)
NEURO HOSPITALIST CONSULT NOTE   Requesting physician: Dr. Tobie Poet  Reason for Consult: Acute onset of right sided weakness and numbness  History obtained from:  Patient and Chart     HPI:                                                                                                                                          William Mueller is an 69 y.o. male with a PMHx of CAD, MI, CKD, COPD on home O2, chronic chest pain, hypercholesterolemia, HTN, sleep apnea and orthopnea, presenting to the ED with a chief complaint of right face, arm and leg weakness with sensory numbness. He states he went to bed at about 10 PM yesterday, but then woke up at about midnight and noticed that his RUE and RLE were weak with sensory numbness. The numbness was worse to his hand. The right side of his face was also weak and there was saliva on it from drooling that he could not feel was there unless he felt it with his hand. The sensory numbness also involved his tongue and the the lateral aspect of his torso on the right. He had some difficulty with balance. Facial drooping was present. He called [facility] in [place] this morning and was advised to go to the ED for evaluation. He gives detailed but slightly different accounts to different providers this visit, for example denying RLE numbness but endorsing pain in that distribution as documented in a separate note, but overall history provided is of right sided weakness and numbness of sudden onset.   In the ED he endorsed that the above symptoms had resolved. He also endorsed intermittent CP lasting a few minutes at a time for about the last month. He denies speech deficit or vision changes during the spell of numbness and weakness. He felt unsteady while walking when he got up to go to the bathroom.   He had a spell of dysphasia about 2 months ago that was witnessed by his wife and lasted for about 30 minutes. During the spell he "could not get  words together". The spell spontaneously resolved. He has no other history of stroke like symptoms.   CT head obtained in the ED reveals a chronic-appearing infarct versus cerebral contusion in the anterior-inferior left frontal lobe, which is new since the prior head CT. Chronic microvascular ischemic changes have progressed since the prior study.   Home medications include ASA and atorvastatin.   Past Medical History:  Diagnosis Date  . Anginal pain ([place]) 06/2017  . Anxiety   . Bronchitis 06/2017  . Chronic chest pain   . Chronic kidney disease (CKD) stage G3b/A1, moderately decreased glomerular filtration rate (GFR) between 30-44 mL/min/1.73 square meter and albuminuria creatinine ratio less than  30 mg/g ([place])   . Chronic lower back pain    WITH LEG WEAKNESS  . COPD (chronic obstructive pulmonary disease) ([facility])    EMPHYSEMA, O2 AT 2L PRN  . Coronary artery disease    a. 12/2013 PCI: mRCA 100% with L to R collats s/p PCI/DES;  b. 06/2014 MV: no ischemia/infarct, EF 53%;  c. 03/2015 Cath: LM nl, LAD nl, D1 80 (1.24mm), LCX 70m (<1.42mm), OM1 nl, RCA 55p, patent stent, RPDA nl, EF 65%; d. 09/2015 Cath: LM nl, LAD 28m, D1 80 (<34mm), LCX 28m/d (<1.52mm), OM1 nl, RCA 55p (FFR 0.93), patent stent, RPDA nl-->Med Rx.  . Daily headache   . Depression   . Diastolic dysfunction    a. echo 12/2013: EF 55-60%, mild LVH, GR1DD, inf HK, elevated CVP, mildly dilated IVC suggestive of increased RA pressure  . Dyspnea    WHEEZING  . GERD (gastroesophageal reflux disease)    REFLUX  . Hearing loss   . High cholesterol   . Hypertension   . Myocardial infarction ([place]) 2015  . Nicotine addiction    a. using eCigs.  . Orthopnea   . Sleep apnea   . Vertigo     Past Surgical History:  Procedure Laterality Date  . CARDIAC CATHETERIZATION     MC x 1 stent  . CARDIAC CATHETERIZATION Left 03/12/2015   Procedure: Left Heart Cath and Coronary Angiography;  Surgeon: Leonie Man, MD;  Location: [facility]  [facility];  Service: Cardiovascular;  Laterality: Left;  . CARDIAC CATHETERIZATION N/A 09/29/2015   Procedure: Left Heart Cath and Coronary Angiography;  Surgeon: Wellington Hampshire, MD;  Location: [facility];  Service: Cardiovascular;  Laterality: N/A;  . CATARACT EXTRACTION W/PHACO Right 08/10/2017   Procedure: CATARACT EXTRACTION PHACO AND INTRAOCULAR LENS PLACEMENT ([place]) RIGHT;  Surgeon: Leandrew Koyanagi, MD;  Location: [facility];  Service: Ophthalmology;  Laterality: Right;  . CORONARY ANGIOPLASTY     STENT PLACEMENT  . LEFT HEART CATH AND CORONARY ANGIOGRAPHY N/A 09/06/2018   Procedure: LEFT HEART CATH AND CORONARY ANGIOGRAPHY;  Surgeon: Minna Merritts, MD;  Location: [facility];  Service: Cardiovascular;  Laterality: N/A;  . LEFT HEART CATHETERIZATION WITH CORONARY ANGIOGRAM N/A 12/12/2013   Procedure: LEFT HEART CATHETERIZATION WITH CORONARY ANGIOGRAM;  Surgeon: Wellington Hampshire, MD;  Location: [facility];  Service: Cardiovascular;  Laterality: N/A;    Family History  Problem Relation Age of Onset  . Coronary artery disease Brother   . Heart disease Brother 26  . CVA Mother   . Heart disease Father        MI x 8  . Coronary artery disease Father   . Kidney disease Neg Hx   . Prostate cancer Neg Hx               Social History:  reports that he quit smoking about 10 years ago. His smoking use included e-cigarettes. He quit after 100.00 years of use. He has never used smokeless tobacco. He reports current alcohol use of about 6.0 standard drinks of alcohol per week. He reports that he does not use drugs.  Allergies  Allergen Reactions  . Paroxetine Hcl Shortness Of Breath and Palpitations  . Serotonin Reuptake Inhibitors ([place]) Shortness Of Breath and Palpitations  . Diazepam Other (See Comments)    Rapid heartrate  . Doxycycline Monohydrate Other (See Comments)    Unknown allergic reaction  . Escitalopram Oxalate Other (See Comments)  serotonin  Syndrome  . Lorazepam Other (See Comments)    Unknown allergic reaction  . Tetracyclines & Related Itching and Rash    MEDICATIONS:                                                                                                                     No current facility-administered medications on file prior to encounter.   Current Outpatient Medications on File Prior to Encounter  Medication Sig Dispense Refill  . ALPRAZolam (XANAX XR) 3 MG 24 hr tablet Take 3 mg by mouth 2 (two) times daily. 1 MG IN THE MORNING AND 3 MG IN THE EVENING    . ALPRAZolam (XANAX) 1 MG tablet Take 1 mg by mouth daily.   5  . aspirin 81 MG EC tablet Take 1 tablet (81 mg total) by mouth daily. (Patient taking differently: Take 81 mg by mouth daily. BREAKFAST) 90 tablet 3  . atorvastatin (LIPITOR) 80 MG tablet Take 1 tablet (80 mg total) by mouth daily. 90 tablet 3  . DALIRESP 500 MCG TABS tablet TAKE 1 TABLET BY MOUTH DAILY. IF NEEDED, TRY TO TAKE 1 TABLET EVERY OTHER DAY (Patient taking differently: Take 500 mcg by mouth daily. ) 90 tablet 1  . ezetimibe (ZETIA) 10 MG tablet Take 1 tablet (10 mg total) by mouth daily. 90 tablet 3  . Fluticasone-Umeclidin-Vilant (TRELEGY ELLIPTA) 100-62.5-25 MCG/INH AEPB Inhale 1 puff into the lungs daily. 60 each 0  . furosemide (LASIX) 20 MG tablet Take 20 mg by mouth daily.     . metoprolol succinate (TOPROL-XL) 100 MG 24 hr tablet TAKE 1 TABLET(100 MG) BY MOUTH DAILY WITH OR IMMEDIATELY FOLLOWING A MEAL (Patient taking differently: Take 100 mg by mouth daily. ) 90 tablet 4  . omeprazole (PRILOSEC) 40 MG capsule Take 40 mg by mouth 2 (two) times daily.    Marland Kitchen acetaminophen (TYLENOL) 500 MG tablet Take 1,000 mg by mouth daily as needed for headache.     . albuterol (PROVENTIL HFA;VENTOLIN HFA) 108 (90 Base) MCG/ACT inhaler Inhale into the lungs every 6 (six) hours as needed for wheezing or shortness of breath.    Marland Kitchen albuterol (PROVENTIL) (2.5 MG/3ML) 0.083% nebulizer solution Take 3 mLs  (2.5 mg total) by nebulization every 6 (six) hours as needed for wheezing or shortness of breath. (Patient not taking: Reported on 05/07/2020) 215 mL 5  . hydrALAZINE (APRESOLINE) 25 MG tablet Take 25 mg by mouth as needed.    Marland Kitchen ipratropium (ATROVENT) 0.06 % nasal spray Place 2 sprays into both nostrils 4 (four) times daily. 15 mL 6  . meclizine (ANTIVERT) 12.5 MG tablet Take 1 tablet (12.5 mg total) by mouth 3 (three) times daily as needed for dizziness. 30 tablet 0  . nitroGLYCERIN (NITROSTAT) 0.4 MG SL tablet Place 1 tablet (0.4 mg total) under the tongue every 5 (five) minutes as needed for chest pain. 25 tablet 1  . OXYGEN Inhale 2 L into the lungs daily as needed (oxygen).  ROS:                                                                                                                                       Has had a several months' long history of right hand numbness and weakness present on awakening that gradually resolves with clenching movements of his fingers and making a fist. He denies vision changes, speech difficulty, left sided weakness, new cough, fever, headache, CP or new SOB.   Blood pressure (!) 178/87, pulse 76, temperature 99 F (37.2 C), temperature source Oral, resp. rate 16, height 5\' 9"  (1.753 m), weight 79.4 kg, SpO2 100 %.   General Examination:                                                                                                       Physical Exam  HEENT-  /AT  Lungs- Respirations unlabored Extremities- No edema. Onychomycosis is noted.   Neurological Examination Mental Status: Awake, alert, oriented x 5, thought content appropriate.  Speech fluent without evidence of aphasia.  Naming and repetition intact. Able to follow all commands without difficulty. Cranial Nerves: II: Visual fields intact with no extinction to DSS. Left pupil 4 mm >> 3 mm, right pupil 3 mm >> 2 mm III,IV, VI: No ptosis. EOMI. No nystagmus.  V,VII: Smile symmetric,  facial temp sensation intact bilaterally VIII: hearing intact to conversation IX,X: No hypophonia or hoarseness XI: Symmetric shoulder shrug XII: Midline tongue extension Motor: Right : Upper extremity   5/5    Left:     Upper extremity   5/5  Lower extremity   5/5     Lower extremity   5/5 No pronator drift Sensory: Temp and light touch intact BUE and BLE without asymmetry, including the hands.  Deep Tendon Reflexes: 2+ bilateral brachioradialis and biceps. Hypoactive patellar reflexes. Absent achilles reflexes Plantars: Right: Upgoing   Left: Weakly upgoing Cerebellar: No ataxia with FNF bilaterally. Low amplitude action tremor is noted.  Gait: Deferred   Lab Results: Basic Metabolic Panel: Recent Labs  Lab 05/07/20 1237  NA 139  K 3.4*  CL 98  CO2 31  GLUCOSE 98  BUN 17  CREATININE 1.81*  CALCIUM 9.1    CBC: Recent Labs  Lab 05/07/20 1237  WBC 8.8  NEUTROABS 4.4  HGB 13.9  HCT 40.7  MCV 85.0  PLT 236    Cardiac Enzymes: No results for input(s): CKTOTAL, CKMB, CKMBINDEX, TROPONINI in the last 168 hours.  Lipid Panel: No results for input(s): CHOL, TRIG, HDL, CHOLHDL, VLDL, LDLCALC in the last 168 hours.  Imaging: CT Head Wo Contrast  Result Date: 05/07/2020 CLINICAL DATA:  Numbness in the right side of the face, right hand and leg last night. EXAM: CT HEAD WITHOUT CONTRAST TECHNIQUE: Contiguous axial images were obtained from the base of the skull through the vertex without intravenous contrast. COMPARISON:  Head CT 05/15/2018. FINDINGS: Brain: Since the prior examination, the patient has suffered an infarct in the inferior left frontal lobe. The infarct cannot be definitively characterized but appears subacute to late subacute. Chronic microvascular ischemic change has progressed. Cortical atrophy again seen. No hemorrhage, mass lesion, midline shift, hydrocephalus or pneumocephalus. Vascular: No hyperdense vessel or unexpected calcification. Skull: Intact.  No  focal lesion. Sinuses/Orbits: Status post cataract surgery on the right. Otherwise unremarkable. Other: None. IMPRESSION: Infarct in the inferior left frontal lobe is new since the prior head CT. The infarct can not be definitively characterized but it appears subacute to late subacute. Chronic microvascular ischemic changes progressed since the prior exam. Cortical atrophy noted. Electronically Signed   By: Inge Rise M.D.   On: 05/07/2020 12:58    Assessment: 69 year old male presenting with right sided weakness and numbness, now resolved.  1. Exam reveals minor and nonspecific findings. No focal motor or sensory deficit is appreciated.  2. CT head reveals chronic-appearing infarct versus cerebral contusion in the anterior-inferior left frontal lobe, which is new since the prior head CT. Chronic microvascular ischemic changes have progressed since the prior exam. Cortical atrophy noted. 3. EKG: Sinus rhythm. Borderline repolarization abnormality. Prolonged QT interval 4. Classifiable as having failed ASA monotherapy.   Recommendations: 1. MRI brain. The patient states that he will need something to relax him for the study as he is claustrophobic. Would use Ativan 2 mg IV prior to transport to MRI.  2. TTE 3. CTA of head and neck.  4. Cardiac telemetry 5. Add Plavix to his home ASA. 6. Continue his statin.  7. Modified permissive HTN protocol given his age and cardiac history. Treat SBP if > 180. After 24 hours, correct by 15% per day to a SBP goal of 120-140  8. Gentle IVF 9. PT/OT/Speech   Electronically signed: Dr. Kerney Elbe 05/07/2020, 1:26 PM

## 2020-05-08 ENCOUNTER — Observation Stay (HOSPITAL_BASED_OUTPATIENT_CLINIC_OR_DEPARTMENT_OTHER)
Admit: 2020-05-08 | Discharge: 2020-05-08 | Disposition: A | Discharge status: Home / Self Care | Payer: PPO | Attending: Internal Medicine | Admitting: Internal Medicine

## 2020-05-08 DIAGNOSIS — R9431 Abnormal electrocardiogram [ECG] [EKG]: Secondary | ICD-10-CM | POA: Diagnosis not present

## 2020-05-08 DIAGNOSIS — G459 Transient cerebral ischemic attack, unspecified: Secondary | ICD-10-CM

## 2020-05-08 DIAGNOSIS — J449 Chronic obstructive pulmonary disease, unspecified: Secondary | ICD-10-CM | POA: Diagnosis not present

## 2020-05-08 DIAGNOSIS — E785 Hyperlipidemia, unspecified: Secondary | ICD-10-CM

## 2020-05-08 DIAGNOSIS — J9611 Chronic respiratory failure with hypoxia: Secondary | ICD-10-CM | POA: Diagnosis not present

## 2020-05-08 DIAGNOSIS — I10 Essential (primary) hypertension: Secondary | ICD-10-CM | POA: Diagnosis not present

## 2020-05-08 DIAGNOSIS — K219 Gastro-esophageal reflux disease without esophagitis: Secondary | ICD-10-CM | POA: Diagnosis not present

## 2020-05-08 LAB — LIPID PANEL
Cholesterol: 98 mg/dL (ref 0–200)
HDL: 31 mg/dL — ABNORMAL LOW (ref >40)
LDL Cholesterol: 51 mg/dL (ref 0–99)
Total CHOL/HDL Ratio: 3.2 RATIO
Triglycerides: 79 mg/dL (ref <150)
VLDL: 16 mg/dL (ref 0–40)

## 2020-05-08 LAB — BASIC METABOLIC PANEL
Anion gap: 9 (ref 5–15)
BUN: 15 mg/dL (ref 8–23)
CO2: 27 mmol/L (ref 22–32)
Calcium: 8.9 mg/dL (ref 8.9–10.3)
Chloride: 102 mmol/L (ref 98–111)
Creatinine, Ser: 1.75 mg/dL — ABNORMAL HIGH (ref 0.61–1.24)
GFR, Estimated: 41 mL/min — ABNORMAL LOW (ref >60)
Glucose, Bld: 92 mg/dL (ref 70–99)
Potassium: 3.7 mmol/L (ref 3.5–5.1)
Sodium: 138 mmol/L (ref 135–145)

## 2020-05-08 LAB — CBC
HCT: 37 % — ABNORMAL LOW (ref 39.0–52.0)
Hemoglobin: 12.6 g/dL — ABNORMAL LOW (ref 13.0–17.0)
MCH: 29.2 pg (ref 26.0–34.0)
MCHC: 34.1 g/dL (ref 30.0–36.0)
MCV: 85.6 fL (ref 80.0–100.0)
Platelets: 212 10*3/uL (ref 150–400)
RBC: 4.32 MIL/uL (ref 4.22–5.81)
RDW: 12.1 % (ref 11.5–15.5)
WBC: 8.3 10*3/uL (ref 4.0–10.5)
nRBC: 0 % (ref 0.0–0.2)

## 2020-05-08 LAB — ECHOCARDIOGRAM COMPLETE
AR max vel: 4.64 cm2
AV Area VTI: 5.1 cm2
AV Area mean vel: 4.02 cm2
AV Mean grad: 3 mmHg
AV Peak grad: 4.8 mmHg
Ao pk vel: 1.1 m/s
Area-P 1/2: 4.77 cm2
Height: 69 in
S' Lateral: 3.47 cm
Weight: 2800 oz

## 2020-05-08 LAB — HIV ANTIBODY (ROUTINE TESTING W REFLEX): HIV Screen 4th Generation wRfx: NONREACTIVE

## 2020-05-08 LAB — HEMOGLOBIN A1C
Hgb A1c MFr Bld: 5.3 % (ref 4.8–5.6)
Mean Plasma Glucose: 105.41 mg/dL

## 2020-05-08 MED ORDER — CLOPIDOGREL BISULFATE 75 MG PO TABS: 75.0000 mg | ORAL_TABLET | Freq: Every day | ORAL | 11 refills | Status: DC

## 2020-05-08 MED ORDER — PERFLUTREN LIPID MICROSPHERE
1.0000 mL | INTRAVENOUS | Status: AC | PRN
  Administered 2020-05-08: 2 mL via INTRAVENOUS
  Filled 2020-05-08: qty 10

## 2020-05-08 MED ORDER — PANTOPRAZOLE SODIUM 40 MG PO TBEC
40.0000 mg | DELAYED_RELEASE_TABLET | Freq: Every day | ORAL | Status: DC
  Administered 2020-05-08: 14:00:00 40 mg via ORAL
  Filled 2020-05-08: qty 1

## 2020-05-08 MED ORDER — ENOXAPARIN SODIUM 40 MG/0.4ML ~~LOC~~ SOLN
40.0000 mg | SUBCUTANEOUS | Status: DC
  Administered 2020-05-08: 14:00:00 40 mg via SUBCUTANEOUS
  Filled 2020-05-08: qty 0.4

## 2020-05-08 NOTE — Discharge Summary (Signed)
Physician Discharge Summary  William Mueller LKT:[id number] DOB: 01-25-1951 DOA: 05/07/2020  PCP: Philmore Pali, NP  Admit date: 05/07/2020 Discharge date: 05/08/2020  Admitted From: Home  Discharge disposition: home  Recommendations for Outpatient Follow-Up:    Follow up with your primary care provider in one week.   Check CBC, BMP, magnesium in the next visit  Follow up with neurology as outpatient in 2 to 3 weeks.  Patient has been prescribed Plavix with aspirin.  Plan is to continue indefinitely.  Discharge Diagnosis:   Active Problems:   TIA (transient ischemic attack)   Prolonged QT interval   Discharge Condition: Improved.  Diet recommendation: Low sodium, heart healthy.   Wound care: None.  Code status: Full.   History of Present Illness:   William Mueller is a 69 y.o. male with medical history significant for COPD, history of myocardial infarction, hyperlipidemia, hypertension, GERD, chronic hypoxic respiratory failure on 2 L of oxygen, CKD stage IIIb presented to hospital with complaints of numbness in the right side of the face and mild weakness without definite onset timeframe.  In the ED, CT the head without contrast in the emergency room was read as infarct in the inferior left frontal lobe is new since prior head CT.  The infarct could not be definitively characterized but it appears subacute to late subacute.  Chronic microvascular ischemic changes progressed since the prior exam.  Cortical atrophy noted.  Patient had stable vitals in the ED but was on supplemental oxygen at baseline for COPD.  Patient was then placed in observation with suspicion of TIA/stroke.  Neurology was consulted who recommended further work-up.  Hospital Course:   Following conditions were addressed during hospitalization as listed below,  TIA Had mild numbness and weakness which has resolved at this time.  Hemoglobin A1c was 5.3.  Lipid profile noted with LDL of 51.  Patient is  already on high-dose statins.  This will be continued on discharge.  2D echocardiogram showed preserved LV function.  Patient was claustrophobic and did not want to undergo MRI of the brain despite offering sedation.  Spoke with neurology about it.  Neurology recommended aspirin and Plavix indefinitely.  CTA of the head and neck did not show any large vessel occlusion.  Physical therapy, Occupational Therapy, speech therapy saw the patient and patient did not require any skilled physical therapy on discharge.  Spoke with the patient's spouse regarding addition of Plavix.  History of hypertension-patient is on hydralazine and metoprolol at home.  Will be resumed on discharge.  Anxiety-on Xanax at home.  Prolonged QT on presentation.  Similar to previous EKG.    Hyperlipidemia-resume atorvastatin 80 mg, ezetimide 10 mg nightly  GERD-on omeprazole at home  COPD-with hypoxic respiratory failure on supplemental oxygen.  Continue trelegy 1 puff daily, Daliresp daily, ipratropium nasal spray 4 times daily daily, albuterol every 6 hours as needed for shortness of breath at home.  Hypokalemia-replenished and improved  Disposition.  At this time, patient is stable for disposition home.  Patient will need to follow-up with his primary care provider and neurology as outpatient.  Medical Consultants:    Neurology  Procedures:    None Subjective:   Today, patient was seen and examined at bedside.  Patient states that he is numbness and weakness has resolved.  Has baseline dyspnea from COPD.  Declines MRI imaging.  Discharge Exam:   Vitals:   05/08/20 0741 05/08/20 1205  BP: 128/70 138/68  Pulse: 68 79  Resp: 17 17  Temp: 98.5 F (36.9 C) 98.5 F (36.9 C)  SpO2: 100% 100%   Vitals:   05/08/20 0010 05/08/20 0425 05/08/20 0741 05/08/20 1205  BP: 130/73 132/65 128/70 138/68  Pulse: 81 78 68 79  Resp: 18 17 17 17   Temp: 98.5 F (36.9 C) 98.5 F (36.9 C) 98.5 F (36.9 C) 98.5 F  (36.9 C)  TempSrc: Oral Oral Oral Oral  SpO2: 100% 99% 100% 100%  Weight:      Height:       General: Alert awake, not in obvious distress, on nasal cannula oxygen HENT: pupils equally reacting to light,  No scleral pallor or icterus noted. Oral mucosa is moist.  Chest:  .  Diminished breath sounds bilaterally. CVS: S1 &S2 heard. No murmur.  Regular rate and rhythm. Abdomen: Soft, nontender, nondistended.  Bowel sounds are heard.   Extremities: No cyanosis, clubbing or edema.  Peripheral pulses are palpable. Psych: Alert, awake and oriented, normal mood CNS:  No cranial nerve deficits.  Power equal in all extremities.   Skin: Warm and dry.  No rashes noted.  The results of significant diagnostics from this hospitalization (including imaging, microbiology, ancillary and laboratory) are listed below for reference.     Diagnostic Studies:   ECHOCARDIOGRAM COMPLETE  Result Date: 05/08/2020    ECHOCARDIOGRAM REPORT   Patient Name:   William Mueller Date of Exam: 05/08/2020 Medical Rec #:  [id number]      Height:       69.0 in Accession #:    [id number]     Weight:       175.0 lb Date of Birth:  June 24, 1950       BSA:          1.952 m Patient Age:    2 years       BP:           128/70 mmHg Patient Gender: M              HR:           86 bpm. Exam Location:  [facility] Procedure: 2D Echo, Color Doppler and Cardiac Doppler Indications:     I163.9 Stroke  History:         Patient has prior history of Echocardiogram examinations, most                  recent 12/19/2018. COPD and CKD; Risk Factors:Sleep Apnea,                  Hypertension, HCL and Current Smoker.  Sonographer:     Charmayne Sheer RDCS (AE) Referring Phys:  [id number] AMY N COX Diagnosing Phys: Ida Rogue MD  Sonographer Comments: Suboptimal parasternal window and suboptimal apical window. Image acquisition challenging due to COPD. IMPRESSIONS  1. Left ventricular ejection fraction, by estimation, is 55 to 60%. The left ventricle has normal function.  The left ventricle has no regional wall motion abnormalities. Left ventricular diastolic parameters are consistent with Grade I diastolic dysfunction (impaired relaxation).  2. Right ventricular systolic function is normal. The right ventricular size is normal. FINDINGS  Left Ventricle: Left ventricular ejection fraction, by estimation, is 55 to 60%. The left ventricle has normal function. The left ventricle has no regional wall motion abnormalities. The left ventricular internal cavity size was normal in size. There is  no left ventricular hypertrophy. Left ventricular diastolic parameters are consistent with Grade I diastolic dysfunction (impaired relaxation). Right Ventricle: The  right ventricular size is normal. No increase in right ventricular wall thickness. Right ventricular systolic function is normal. Left Atrium: Left atrial size was normal in size. Right Atrium: Right atrial size was normal in size. Pericardium: There is no evidence of pericardial effusion. Mitral Valve: The mitral valve is normal in structure. No evidence of mitral valve regurgitation. No evidence of mitral valve stenosis. MV peak gradient, 4.7 mmHg. The mean mitral valve gradient is 3.0 mmHg. Tricuspid Valve: The tricuspid valve is normal in structure. Tricuspid valve regurgitation is not demonstrated. No evidence of tricuspid stenosis. Aortic Valve: The aortic valve was not well visualized. Aortic valve regurgitation is not visualized. No aortic stenosis is present. Aortic valve mean gradient measures 3.0 mmHg. Aortic valve peak gradient measures 4.8 mmHg. Aortic valve area, by VTI measures 5.10 cm. Pulmonic Valve: The pulmonic valve was normal in structure. Pulmonic valve regurgitation is not visualized. No evidence of pulmonic stenosis. Aorta: The aortic root is normal in size and structure. Venous: The inferior vena cava is normal in size with greater than 50% respiratory variability, suggesting right atrial pressure of 3 mmHg.  IAS/Shunts: No atrial level shunt detected by color flow Doppler.  LEFT VENTRICLE PLAX 2D LVIDd:         4.52 cm  Diastology LVIDs:         3.47 cm  LV e' medial:    6.09 cm/s LV PW:         1.10 cm  LV E/e' medial:  10.6 LV IVS:        1.03 cm  LV e' lateral:   7.94 cm/s LVOT diam:     2.50 cm  LV E/e' lateral: 8.1 LV SV:         104 LV SV Index:   53 LVOT Area:     4.91 cm  LEFT ATRIUM             Index LA Vol (A2C):   41.9 ml 21.47 ml/m LA Vol (A4C):   43.0 ml 22.03 ml/m LA Biplane Vol: 43.2 ml 22.13 ml/m  AORTIC VALVE                   PULMONIC VALVE AV Area (Vmax):    4.64 cm    PV Vmax:       1.31 m/s AV Area (Vmean):   4.02 cm    PV Vmean:      91.200 cm/s AV Area (VTI):     5.10 cm    PV VTI:        0.294 m AV Vmax:           110.00 cm/s PV Peak grad:  6.9 mmHg AV Vmean:          80.300 cm/s PV Mean grad:  4.0 mmHg AV VTI:            0.204 m AV Peak Grad:      4.8 mmHg AV Mean Grad:      3.0 mmHg LVOT Vmax:         104.00 cm/s LVOT Vmean:        65.700 cm/s LVOT VTI:          0.212 m LVOT/AV VTI ratio: 1.04  AORTA Ao Root diam: 2.80 cm MITRAL VALVE MV Area (PHT): 4.77 cm    SHUNTS MV Peak grad:  4.7 mmHg    Systemic VTI:  0.21 m MV Mean grad:  3.0 mmHg    Systemic Diam:  2.50 cm MV Vmax:       1.08 m/s MV Vmean:      80.5 cm/s MV Decel Time: 159 msec MV E velocity: 64.70 cm/s MV A velocity: 88.30 cm/s MV E/A ratio:  0.73 Ida Rogue MD Electronically signed by Ida Rogue MD Signature Date/Time: 05/08/2020/1:08:10 PM    Final      Labs:   Basic Metabolic Panel: Recent Labs  Lab 05/07/20 1237 05/08/20 0435  NA 139 138  K 3.4* 3.7  CL 98 102  CO2 31 27  GLUCOSE 98 92  BUN 17 15  CREATININE 1.81* 1.75*  CALCIUM 9.1 8.9   GFR Estimated Creatinine Clearance: 39.3 mL/min (A) (by C-G formula based on SCr of 1.75 mg/dL (H)). Liver Function Tests: Recent Labs  Lab 05/07/20 1237  AST 17  ALT 16  ALKPHOS 114  BILITOT 0.9  PROT 7.5  ALBUMIN 4.2   No results for input(s):  LIPASE, AMYLASE in the last 168 hours. No results for input(s): AMMONIA in the last 168 hours. Coagulation profile Recent Labs  Lab 05/07/20 1237  INR 1.0    CBC: Recent Labs  Lab 05/07/20 1237 05/08/20 0435  WBC 8.8 8.3  NEUTROABS 4.4  --   HGB 13.9 12.6*  HCT 40.7 37.0*  MCV 85.0 85.6  PLT 236 212   Cardiac Enzymes: No results for input(s): CKTOTAL, CKMB, CKMBINDEX, TROPONINI in the last 168 hours. BNP: Invalid input(s): POCBNP CBG: No results for input(s): GLUCAP in the last 168 hours. D-Dimer No results for input(s): DDIMER in the last 72 hours. Hgb A1c Recent Labs    05/08/20 0435  HGBA1C 5.3   Lipid Profile Recent Labs    05/08/20 0435  CHOL 98  HDL 31*  LDLCALC 51  TRIG 79  CHOLHDL 3.2   Thyroid function studies No results for input(s): TSH, T4TOTAL, T3FREE, THYROIDAB in the last 72 hours.  Invalid input(s): FREET3 Anemia work up No results for input(s): VITAMINB12, FOLATE, FERRITIN, TIBC, IRON, RETICCTPCT in the last 72 hours. Microbiology Recent Results (from the past 240 hour(s))  Resp Panel by RT-PCR (Flu A&B, Covid) Nasopharyngeal Swab     Status: None   Collection Time: 05/07/20  3:28 PM   Specimen: Nasopharyngeal Swab; Nasopharyngeal(NP) swabs in vial transport medium  Result Value Ref Range Status   SARS Coronavirus 2 by RT PCR NEGATIVE NEGATIVE Final    Comment: (NOTE) SARS-CoV-2 target nucleic acids are NOT DETECTED.  The SARS-CoV-2 RNA is generally detectable in upper respiratory specimens during the acute phase of infection. The lowest concentration of SARS-CoV-2 viral copies this assay can detect is 138 copies/mL. A negative result does not preclude SARS-Cov-2 infection and should not be used as the sole basis for treatment or other patient management decisions. A negative result may occur with  improper specimen collection/handling, submission of specimen other than nasopharyngeal swab, presence of viral mutation(s) within  the areas targeted by this assay, and inadequate number of viral copies(<138 copies/mL). A negative result must be combined with clinical observations, patient history, and epidemiological information. The expected result is Negative.  Fact Sheet for Patients:  EntrepreneurPulse.com.au  Fact Sheet for Healthcare Providers:  IncredibleEmployment.be  This test is no t yet approved or cleared by the [place] FDA and  has been authorized for detection and/or diagnosis of SARS-CoV-2 by FDA under an Emergency Use Authorization (EUA). This EUA will remain  in effect (meaning this test can be used) for the duration of the COVID-19 declaration  under Section 564(b)(1) of the Act, 21 U.S.C.section 360bbb-3(b)(1), unless the authorization is terminated  or revoked sooner.       Influenza A by PCR NEGATIVE NEGATIVE Final   Influenza B by PCR NEGATIVE NEGATIVE Final    Comment: (NOTE) The Xpert Xpress SARS-CoV-2/FLU/RSV plus assay is intended as an aid in the diagnosis of influenza from Nasopharyngeal swab specimens and should not be used as a sole basis for treatment. Nasal washings and aspirates are unacceptable for Xpert Xpress SARS-CoV-2/FLU/RSV testing.  Fact Sheet for Patients: EntrepreneurPulse.com.au  Fact Sheet for Healthcare Providers: IncredibleEmployment.be  This test is not yet approved or cleared by the [place] FDA and has been authorized for detection and/or diagnosis of SARS-CoV-2 by FDA under an Emergency Use Authorization (EUA). This EUA will remain in effect (meaning this test can be used) for the duration of the COVID-19 declaration under Section 564(b)(1) of the Act, 21 U.S.C. section 360bbb-3(b)(1), unless the authorization is terminated or revoked.  Performed at [facility], [address] [place], [place] [address]      Discharge Instructions:   Discharge  Instructions    Diet - low sodium heart healthy   Complete by: As directed    Discharge instructions   Complete by: As directed    Follow up with your primary care provider in one week, neurology in 2-3 weeks. Continue take aspirin and plavix. Continue to use oxygen at home.   Increase activity slowly   Complete by: As directed      Allergies as of 05/08/2020      Reactions   Paroxetine Hcl Shortness Of Breath, Palpitations   Serotonin Reuptake Inhibitors (ssris) Shortness Of Breath, Palpitations   Diazepam Other (See Comments)   Rapid heartrate   Doxycycline Monohydrate Other (See Comments)   Unknown allergic reaction   Escitalopram Oxalate Other (See Comments)   serotonin Syndrome   Lorazepam Other (See Comments)   Unknown allergic reaction   Tetracyclines & Related Itching, Rash      Medication List    TAKE these medications   acetaminophen 500 MG tablet Commonly known as: TYLENOL Take 1,000 mg by mouth daily as needed for headache.   albuterol 108 (90 Base) MCG/ACT inhaler Commonly known as: VENTOLIN HFA Inhale into the lungs every 6 (six) hours as needed for wheezing or shortness of breath.   albuterol (2.5 MG/3ML) 0.083% nebulizer solution Commonly known as: PROVENTIL Take 3 mLs (2.5 mg total) by nebulization every 6 (six) hours as needed for wheezing or shortness of breath.   ALPRAZolam 3 MG 24 hr tablet Commonly known as: XANAX XR Take 3 mg by mouth 2 (two) times daily. 1 MG IN THE MORNING AND 3 MG IN THE EVENING   ALPRAZolam 1 MG tablet Commonly known as: XANAX Take 1 mg by mouth daily.   aspirin 81 MG EC tablet Take 1 tablet (81 mg total) by mouth daily. What changed: additional instructions   atorvastatin 80 MG tablet Commonly known as: LIPITOR Take 1 tablet (80 mg total) by mouth daily.   clopidogrel 75 MG tablet Commonly known as: PLAVIX Take 1 tablet (75 mg total) by mouth daily. Start taking on: May 09, 2020   Daliresp 500 MCG Tabs  tablet Generic drug: roflumilast TAKE 1 TABLET BY MOUTH DAILY. IF NEEDED, TRY TO TAKE 1 TABLET EVERY OTHER DAY What changed: See the new instructions.   ezetimibe 10 MG tablet Commonly known as: ZETIA Take 1 tablet (10 mg total) by mouth  daily.   furosemide 20 MG tablet Commonly known as: LASIX Take 20 mg by mouth daily.   hydrALAZINE 25 MG tablet Commonly known as: APRESOLINE Take 25 mg by mouth as needed.   ipratropium 0.06 % nasal spray Commonly known as: ATROVENT Place 2 sprays into both nostrils 4 (four) times daily.   meclizine 12.5 MG tablet Commonly known as: ANTIVERT Take 1 tablet (12.5 mg total) by mouth 3 (three) times daily as needed for dizziness.   metoprolol succinate 100 MG 24 hr tablet Commonly known as: TOPROL-XL TAKE 1 TABLET(100 MG) BY MOUTH DAILY WITH OR IMMEDIATELY FOLLOWING A MEAL What changed: See the new instructions.   nitroGLYCERIN 0.4 MG SL tablet Commonly known as: Nitrostat Place 1 tablet (0.4 mg total) under the tongue every 5 (five) minutes as needed for chest pain.   omeprazole 40 MG capsule Commonly known as: PRILOSEC Take 40 mg by mouth 2 (two) times daily.   OXYGEN Inhale 2 L into the lungs daily as needed (oxygen).   Trelegy Ellipta 100-62.5-25 MCG/INH Aepb Generic drug: Fluticasone-Umeclidin-Vilant Inhale 1 puff into the lungs daily.       Follow-up Information    Philmore Pali, NP. Schedule an appointment as soon as possible for a visit in 1 week(s).   Specialty: Nurse Practitioner Why: regular followup Contact information: [place] [address] [phone]        Minna Merritts, MD .   Specialty: Cardiology Contact information: [place] [address] [phone]        Vladimir Crofts, MD. Schedule an appointment as soon as possible for a visit in 3 week(s).   Specialty: Neurology Why: stroke/TIA followup Contact information: [place] [facility] [place] [address] [phone]                Time coordinating discharge: 39 minutes  Signed:  Laporsche Hoeger  [facility] 05/08/2020, 1:38 PM

## 2020-05-08 NOTE — Progress Notes (Signed)
Received MD order to discharge patient to home, reviewed discharge instructions, home meds, prescriptions and follow up appointments with patient and patient verbalized understanding   

## 2020-05-08 NOTE — Care Management Obs Status (Signed)
[place] NOTIFICATION   Patient Details  Name: William Mueller MRN: [id number] Date of Birth: 1950/06/16   Medicare Observation Status Notification Given:  Yes    Shelbie Hutching, RN 05/08/2020, 12:36 PM

## 2020-05-08 NOTE — Progress Notes (Signed)
*  PRELIMINARY RESULTS* Echocardiogram 2D Echocardiogram has been performed.  William Mueller Geral Coker 05/08/2020, 11:38 AM

## 2020-05-08 NOTE — Evaluation (Signed)
Physical Therapy Evaluation Patient Details Name: William Mueller MRN: [id number] DOB: 04-Sep-1950 Today's Date: 05/08/2020   History of Present Illness  69 y.o. male with PMH as noted below including CKD, CAD s/p MI and COPD on home O2 who presents with right sided numbness, acute onset around midnight last night and now resolved.  The patient states he fell asleep around 10pm, and when he awoke around midnight he had numbness to the right side of his face and neck, and could not feel his right hand.  He did not have any numbness in the leg.  He then had a pain that went down his right side, down the leg, and then up the leg on the left.  He got up to go to the bathroom and had to steady himself because he felt off balance, but he could walk.  He had no vision or speech disturbance.  He is unsure when the symptoms resolved.  He states he still feels a bit generally weak.  Clinical Impression  Pt is a pleasant 69 year old male who was admitted for CVA. Pt demonstrates all bed mobility/transfers/ambulation at baseline level. Good coordination in B UE/LE and no sensation deficits. Pt reports he feels at baseline level. All mobility performed on RA with O2 sats decreasing to 84% and HR at 137. Pt reports he is supposed to wear 2L of O2 at home. Once donned, O2 sats improve to 98%. Encouraged to continue wearing O2 with mobility. Pt does not require any further PT needs at this time. Pt will be dc in house and does not require follow up. RN aware. Will dc current orders.     Follow Up Recommendations No PT follow up    Equipment Recommendations  None recommended by PT    Recommendations for Other Services       Precautions / Restrictions Precautions Precautions: None Restrictions Weight Bearing Restrictions: No      Mobility  Bed Mobility Overal bed mobility: Independent Bed Mobility: Supine to Sit Rolling: Modified independent (Device/Increase time)   Supine to sit: Modified independent  (Device/Increase time)     General bed mobility comments: safe technique    Transfers Overall transfer level: Independent Equipment used: None Transfers: Sit to/from Stand Sit to Stand: Independent Stand pivot transfers: Supervision       General transfer comment: ease of mobility, upright posture noted. No LOB  Ambulation/Gait Ambulation/Gait assistance: Supervision Gait Distance (Feet): 200 Feet Assistive device: None Gait Pattern/deviations: WFL(Within Functional Limits)     General Gait Details: safe technique with good cadence and speed. No LOB noted and pt able to carry conversation with ambulation  Stairs            Wheelchair Mobility    Modified Rankin (Stroke Patients Only)       Balance Overall balance assessment: Independent   Sitting balance-Leahy Scale: Normal     Standing balance support: During functional activity Standing balance-Leahy Scale: Fair                               Pertinent Vitals/Pain Pain Assessment: No/denies pain    Home Living Family/patient expects to be discharged to:: Private residence Living Arrangements: Spouse/significant other Available Help at Discharge: Family;Available 24 hours/day Type of Home: House Home Access: Stairs to enter Entrance Stairs-Rails: Chemical engineer of Steps: 3 STE side entrance and 15 STE front door Home Layout: One level Home Equipment: None  Prior Function Level of Independence: Independent         Comments: reports no falls, indep and retired     Engineer, manufacturing Dominance   Dominant Hand: Left    Extremity/Trunk Assessment   Upper Extremity Assessment Upper Extremity Assessment: Overall WFL for tasks assessed    Lower Extremity Assessment Lower Extremity Assessment: Overall WFL for tasks assessed       Communication   Communication: No difficulties  Cognition Arousal/Alertness: Awake/alert Behavior During Therapy: WFL for tasks  assessed/performed Overall Cognitive Status: Within Functional Limits for tasks assessed                                        General Comments      Exercises     Assessment/Plan    PT Assessment Patent does not need any further PT services  PT Problem List         PT Treatment Interventions      PT Goals (Current goals can be found in the Care Plan section)  Acute Rehab PT Goals Patient Stated Goal: to go home PT Goal Formulation: All assessment and education complete, DC therapy Time For Goal Achievement: 05/08/20 Potential to Achieve Goals: Good    Frequency     Barriers to discharge        Co-evaluation               AM-PAC PT "6 Clicks" Mobility  Outcome Measure Help needed turning from your back to your side while in a flat bed without using bedrails?: None Help needed moving from lying on your back to sitting on the side of a flat bed without using bedrails?: None Help needed moving to and from a bed to a chair (including a wheelchair)?: None Help needed standing up from a chair using your arms (e.g., wheelchair or bedside chair)?: None Help needed to walk in hospital room?: None Help needed climbing 3-5 steps with a railing? : None 6 Click Score: 24    End of Session Equipment Utilized During Treatment: Gait belt Activity Tolerance: Patient tolerated treatment well Patient left: in bed;with bed alarm set Nurse Communication: Mobility status PT Visit Diagnosis: Muscle weakness (generalized) (M62.81)    Time: 3704-8889 PT Time Calculation (min) (ACUTE ONLY): 19 min   Charges:   PT Evaluation $PT Eval Low Complexity: 1 Low PT Treatments $Gait Training: 8-22 mins        Greggory Stallion, PT, DPT [phone]   William Mueller 05/08/2020, 11:53 AM

## 2020-05-08 NOTE — Evaluation (Signed)
Occupational Therapy Evaluation Patient Details Name: William Mueller MRN: [id number] DOB: 08-15-1950 Today's Date: 05/08/2020    History of Present Illness 69 y.o. male with PMH as noted below including CKD, CAD s/p MI and COPD on home O2 who presents with right sided numbness, acute onset around midnight last night and now resolved.  The patient states he fell asleep around 10pm, and when he awoke around midnight he had numbness to the right side of his face and neck, and could not feel his right hand.  He did not have any numbness in the leg.  He then had a pain that went down his right side, down the leg, and then up the leg on the left.  He got up to go to the bathroom and had to steady himself because he felt off balance, but he could walk.  He had no vision or speech disturbance.  He is unsure when the symptoms resolved.  He states he still feels a bit generally weak.   Clinical Impression   Patient presenting with decreased I in self care, balance, functional transfers/mobility,endurance, and safety awareness. Patient's wife present during evaluation as well. Pt reports being independent without use of AD  PTA and still driving. Patient currently functioning at supervision overall with all aspects of care. However, OT did note that pt had R lateral lean in standing at sink and 1 LOB requiring min A in tight space within room. OT recommending intermittent supervision for this reason but no OT follow up needed at discharge.Patient will benefit from acute OT to increase overall independence in the areas of ADLs, functional mobility, and safety awareness in order to safely discharge home with wife.    Follow Up Recommendations  No OT follow up;Supervision - Intermittent    Equipment Recommendations  None recommended by OT       Precautions / Restrictions Precautions Precautions: None Restrictions Weight Bearing Restrictions: No      Mobility Bed Mobility Overal bed mobility: Modified  Independent Bed Mobility: Rolling;Supine to Sit Rolling: Modified independent (Device/Increase time)   Supine to sit: Modified independent (Device/Increase time)     General bed mobility comments: increased time and HOB elevated    Transfers Overall transfer level: Needs assistance Equipment used: None Transfers: Sit to/from [facility] Sit to Stand: Supervision Stand pivot transfers: Supervision            Balance Overall balance assessment: Mild deficits observed, not formally tested   Sitting balance-Leahy Scale: Normal     Standing balance support: During functional activity Standing balance-Leahy Scale: Fair         ADL either performed or assessed with clinical judgement   ADL       General ADL Comments: Supervision overall with mobility and self care tasks with min cuing for safety awareness. 1 LOB requiring Min A to correct for safety.     Vision Baseline Vision/History: Macular Degeneration Patient Visual Report: No change from baseline           Hand Dominance Left   Extremity/Trunk Assessment Upper Extremity Assessment Upper Extremity Assessment: Overall WFL for tasks assessed   Lower Extremity Assessment Lower Extremity Assessment: Overall WFL for tasks assessed       Communication Communication Communication: No difficulties   Cognition Arousal/Alertness: Awake/alert Behavior During Therapy: WFL for tasks assessed/performed Overall Cognitive Status: Within Functional Limits for tasks assessed  Home Living Family/patient expects to be discharged to:: Private residence Living Arrangements: Spouse/significant other Available Help at Discharge: Family;Available 24 hours/day Type of Home: House Home Access: Stairs to enter [employer] of Steps: 3 STE side entrance and 15 STE front door Entrance Stairs-Rails: Left;Right Home Layout: One level     Bathroom Shower/Tub: Arts administrator: Standard     Home Equipment: None          Prior Functioning/Environment Level of Independence: Independent                 OT Problem List: Decreased strength;Decreased activity tolerance;Decreased safety awareness;Impaired balance (sitting and/or standing);Decreased knowledge of use of DME or AE      OT Treatment/Interventions: Self-care/ADL training;Therapeutic exercise;Therapeutic activities;DME and/or AE instruction;Patient/family education;Energy conservation;Cognitive remediation/compensation;Balance training    OT Goals(Current goals can be found in the care plan section) Acute Rehab OT Goals Patient Stated Goal: to go home OT Goal Formulation: With patient Time For Goal Achievement: 05/22/20 Potential to Achieve Goals: Good ADL Goals Pt Will Perform Grooming: Independently;standing Pt Will Transfer to Toilet: Independently;ambulating Pt Will Perform Toileting - Clothing Manipulation and hygiene: Independently;sit to/from stand Pt Will Perform Tub/Shower Transfer: Shower transfer;with supervision;ambulating  OT Frequency: Min 1X/week   Barriers to D/C:    none at this time          AM-PAC OT "6 Clicks" Daily Activity     Outcome Measure Help from another person eating meals?: None Help from another person taking care of personal grooming?: None Help from another person toileting, which includes using toliet, bedpan, or urinal?: A Little Help from another person bathing (including washing, rinsing, drying)?: A Little Help from another person to put on and taking off regular upper body clothing?: None Help from another person to put on and taking off regular lower body clothing?: A Little 6 Click Score: 21   End of Session Equipment Utilized During Treatment: Oxygen (2L) Nurse Communication: Mobility status  Activity Tolerance: Patient tolerated treatment well Patient left: in bed;with family/visitor present;with call bell/phone  within reach  OT Visit Diagnosis: Unsteadiness on feet (R26.81);Muscle weakness (generalized) (M62.81)                Time: 7322-0254 OT Time Calculation (min): 24 min Charges:  OT General Charges $OT Visit: 1 Visit OT Evaluation $OT Eval Low Complexity: 1 Low OT Treatments $Self Care/Home Management : 8-22 mins  Darleen Crocker, MS, OTR/L , CBIS ascom [phone]  05/08/20, 11:11 AM

## 2020-05-08 NOTE — Progress Notes (Signed)
SLP Cancellation Note  Patient Details Name: CID AGENA MRN: [id number] DOB: 15-Apr-1951   Cancelled treatment:       Reason Eval/Treat Not Completed: SLP screened, no needs identified, will sign off (chart reviewed; consulted NSG then met w/ pt/Wife). Pt denied any difficulty swallowing and is currently on a regular diet; tolerates swallowing pills w/ water per NSG. Pt conversed at conversational level w/out deficits noted; pt and Wife denied any speech-language deficits.  No further skilled ST services indicated as pt appears at his baseline. Pt agreed. NSG to reconsult if any change in status while admitted.     Orinda Kenner, MS, CCC-SLP Speech Language Pathologist Rehab Services [phone] [facility] 05/08/2020, 11:09 AM

## 2020-05-13 DIAGNOSIS — H34832 Tributary (branch) retinal vein occlusion, left eye, with macular edema: Secondary | ICD-10-CM | POA: Diagnosis not present

## 2020-05-13 DIAGNOSIS — H353211 Exudative age-related macular degeneration, right eye, with active choroidal neovascularization: Secondary | ICD-10-CM | POA: Diagnosis not present

## 2020-05-15 ENCOUNTER — Ambulatory Visit (INDEPENDENT_AMBULATORY_CARE_PROVIDER_SITE_OTHER): Payer: PPO

## 2020-05-15 ENCOUNTER — Encounter: Payer: Self-pay | Admitting: Physician Assistant

## 2020-05-15 ENCOUNTER — Ambulatory Visit: Payer: PPO | Admitting: Physician Assistant

## 2020-05-15 ENCOUNTER — Other Ambulatory Visit: Payer: Self-pay

## 2020-05-15 VITALS — BP 136/72 | HR 75 | Ht 69.0 in | Wt 179.0 lb

## 2020-05-15 DIAGNOSIS — I25118 Atherosclerotic heart disease of native coronary artery with other forms of angina pectoris: Secondary | ICD-10-CM

## 2020-05-15 DIAGNOSIS — I471 Supraventricular tachycardia, unspecified: Secondary | ICD-10-CM

## 2020-05-15 DIAGNOSIS — G459 Transient cerebral ischemic attack, unspecified: Secondary | ICD-10-CM

## 2020-05-15 DIAGNOSIS — I5032 Chronic diastolic (congestive) heart failure: Secondary | ICD-10-CM

## 2020-05-15 DIAGNOSIS — N183 Chronic kidney disease, stage 3 unspecified: Secondary | ICD-10-CM | POA: Diagnosis not present

## 2020-05-15 DIAGNOSIS — E785 Hyperlipidemia, unspecified: Secondary | ICD-10-CM | POA: Diagnosis not present

## 2020-05-15 DIAGNOSIS — I2510 Atherosclerotic heart disease of native coronary artery without angina pectoris: Secondary | ICD-10-CM

## 2020-05-15 DIAGNOSIS — Z87891 Personal history of nicotine dependence: Secondary | ICD-10-CM

## 2020-05-15 DIAGNOSIS — I10 Essential (primary) hypertension: Secondary | ICD-10-CM

## 2020-05-15 DIAGNOSIS — J441 Chronic obstructive pulmonary disease with (acute) exacerbation: Secondary | ICD-10-CM

## 2020-05-15 DIAGNOSIS — I6523 Occlusion and stenosis of bilateral carotid arteries: Secondary | ICD-10-CM

## 2020-05-15 DIAGNOSIS — Z87898 Personal history of other specified conditions: Secondary | ICD-10-CM

## 2020-05-15 DIAGNOSIS — J449 Chronic obstructive pulmonary disease, unspecified: Secondary | ICD-10-CM | POA: Diagnosis not present

## 2020-05-15 NOTE — Progress Notes (Signed)
Office Visit    Patient Name: William Mueller Date of Encounter: 05/15/2020  Primary Care Provider:  Philmore Pali, NP Primary Cardiologist:  Ida Rogue, MD  Chief Complaint    Chief Complaint  Patient presents with  . Other    Hospital follow up - Patient c/o feet numbness and right hand tingling. Meds reviewed verbally with patient.     69 yo male with a hx of CAD s/p 2015 PCI to RCA and repeat catheterization 2016 /2017/2020, chronic chest pain with negative enzymes, paroxysmal atrial tachycardia, COPD on 2 L of oxygen, obesity, hypertension, hyperlipidemia, HFrEF, CKD 3, GERD, anxiety, and prior history of smoking with h/o e-cigarette use, and who presents today for recent 05/2020 CVA and [facility] admission.  Past Medical History    Past Medical History:  Diagnosis Date  . Anginal pain ([place]) 06/2017  . Anxiety   . Bronchitis 06/2017  . Chronic chest pain   . Chronic kidney disease (CKD) stage G3b/A1, moderately decreased glomerular filtration rate (GFR) between 30-44 mL/min/1.73 square meter and albuminuria creatinine ratio less than 30 mg/g (HCC)   . Chronic lower back pain    WITH LEG WEAKNESS  . COPD (chronic obstructive pulmonary disease) (HCC)    EMPHYSEMA, O2 AT 2L PRN  . Coronary artery disease    a. 12/2013 PCI: mRCA 100% with L to R collats s/p PCI/DES;  b. 06/2014 MV: no ischemia/infarct, EF 53%;  c. 03/2015 Cath: LM nl, LAD nl, D1 80 (1.58mm), LCX 76m (<1.59mm), OM1 nl, RCA 55p, patent stent, RPDA nl, EF 65%; d. 09/2015 Cath: LM nl, LAD 75m, D1 80 (<67mm), LCX 31m/d (<1.45mm), OM1 nl, RCA 55p (FFR 0.93), patent stent, RPDA nl-->Med Rx.  . Daily headache   . Depression   . Diastolic dysfunction    a. echo 12/2013: EF 55-60%, mild LVH, GR1DD, inf HK, elevated CVP, mildly dilated IVC suggestive of increased RA pressure  . Dyspnea    WHEEZING  . GERD (gastroesophageal reflux disease)    REFLUX  . Hearing loss   . High cholesterol   . Hypertension   . Myocardial  infarction ([place]) 2015  . Nicotine addiction    a. using eCigs.  . Orthopnea   . Sleep apnea   . Vertigo    Past Surgical History:  Procedure Laterality Date  . CARDIAC CATHETERIZATION     MC x 1 stent  . CARDIAC CATHETERIZATION Left 03/12/2015   Procedure: Left Heart Cath and Coronary Angiography;  Surgeon: Leonie Man, MD;  Location: [place] [facility];  Service: Cardiovascular;  Laterality: Left;  . CARDIAC CATHETERIZATION N/A 09/29/2015   Procedure: Left Heart Cath and Coronary Angiography;  Surgeon: Wellington Hampshire, MD;  Location: [place] [facility];  Service: Cardiovascular;  Laterality: N/A;  . CATARACT EXTRACTION W/PHACO Right 08/10/2017   Procedure: CATARACT EXTRACTION PHACO AND INTRAOCULAR LENS PLACEMENT ([place]) RIGHT;  Surgeon: Leandrew Koyanagi, MD;  Location: [place];  Service: Ophthalmology;  Laterality: Right;  . CORONARY ANGIOPLASTY     STENT PLACEMENT  . LEFT HEART CATH AND CORONARY ANGIOGRAPHY N/A 09/06/2018   Procedure: LEFT HEART CATH AND CORONARY ANGIOGRAPHY;  Surgeon: Minna Merritts, MD;  Location: [place] [facility];  Service: Cardiovascular;  Laterality: N/A;  . LEFT HEART CATHETERIZATION WITH CORONARY ANGIOGRAM N/A 12/12/2013   Procedure: LEFT HEART CATHETERIZATION WITH CORONARY ANGIOGRAM;  Surgeon: Wellington Hampshire, MD;  Location: [place] [facility];  Service: Cardiovascular;  Laterality: N/A;  Allergies  Allergies  Allergen Reactions  . Paroxetine Hcl Shortness Of Breath and Palpitations  . Serotonin Reuptake Inhibitors ([place]) Shortness Of Breath and Palpitations  . Diazepam Other (See Comments)    Rapid heartrate  . Doxycycline Monohydrate Other (See Comments)    Unknown allergic reaction  . Escitalopram Oxalate Other (See Comments)    serotonin Syndrome  . Lorazepam Other (See Comments)    Unknown allergic reaction  . Tetracyclines & Related Itching and Rash    History of Present Illness    William Mueller is a 69 y.o. male with  PMH as above. He lives a sedentary lifestyle without any exercise program. He  has a history of both tobacco use and E cigarettes/vaping.   He has a h/o CAD with 12/2013 cardiac catheterization for CP and PCI/DES to RCA.  He underwent repeat cardiac cath in October 2016 for chest pain that showed patent stent and disease of small diagonal and distal circumflex but not amenable to intervention.  09/2015 cath for chest pain showed patent RCA stent and stable disease to proximal LAD, small first diagonal and the AV groove branch of the left circumflex with recommendation for medical therapy.  He was admitted 09/2017 for chest pain and ruled out with medical therapy advised.    09/2018 cath for recurrent CP that woke him from sleep showed etiology of chest pain could be secondary to small vessel disease.  Images were reviewed by interventional cardiology with recommendation for medical management.  He was started on Ranexa 500 mg twice daily for 1 week with plan to go up to 1000 mg twice daily thereafter.  Seen at follow-up in clinic and reportedly had stopped Ranexa due to a jittery feeling.    He was diagnosed with COPD and spring 2020.  11/2018 ZIO monitor was performed for palpitations and showed NSR with 2 runs of SVT, as well as ectopy.  Toprol was increased.    12/2018 echo showed EF 60 to 53% and diastolic dysfunction.  He was seen in the office 01/21/2020 for 36-month follow-up by his primary cardiologist, Dr. Rockey Situ.  He reported his breathing was doing well.  He was having stretches of oxygen saturation in the 90s without oxygen supplementation.  He had significant hand pain that was worse in the mornings when waking up.  He also reported constipation with colonoscopy canceled secondary to lung issues/COPD.  BP was noted to be well controlled.  On 05/07/2020, his wife called the office with concern that the patient may have had a stroke the previous evening.  She reported that part of his tongue was  numb, as well as the fingers in his hand.  He had loss of balance with right leg weakness.  She also noted facial drooping.  Recommendation was to present to the emergency department, given his symptoms were concerning for stroke.  He was admitted from the emergency department 12/1 for complaints of numbness to the right side of his face and mild weakness without definite timeframe onset.  CT head in the ED showed infarct of the inferior left frontal lobe, new since prior head CT.  Neurology was consulted and recommended further work-up.  He was claustrophobic and did not want to undergo MRI of the brain, despite being offered sedation.  Neurology thus recommended ASA and Plavix indefinitely.  CTA of the head and neck did not show any large vessel occlusion and showed mild bilateral carotid disease as copied and pasted below.  Echo was performed  with normal LV function.  Prolonged QT was noted on EKG.  Also noted was significant hypokalemia on labs.  He was discharged 05/08/2020 and scheduled with cardiology for follow-up.  Today, 05/15/2020, he presents to clinic and describes the above progression of right-sided numbness and weakness.  He continues to note residual numbness and weakness to the right side.  He reports leaning to the right side and often feeling like the left side is more heavy than that of the right.  He also reports cool hands and right-sided neck pain.  He denies any chest pain, shortness of breath, presyncope, or syncope with the event.  No racing heart rate or palpitations.  He continues on oxygen and reports breathing at baseline.  No signs or symptoms of breathing.  We reviewed his admission and previous cardiac work-up.  He reports he quit smoking/vaping today.  He is very proud of this accomplishment with encouragement provided.  Home Medications    Current Outpatient Medications on File Prior to Visit  Medication Sig Dispense Refill  . acetaminophen (TYLENOL) 500 MG tablet Take  1,000 mg by mouth daily as needed for headache.     . albuterol (PROVENTIL HFA;VENTOLIN HFA) 108 (90 Base) MCG/ACT inhaler Inhale into the lungs every 6 (six) hours as needed for wheezing or shortness of breath.    Marland Kitchen albuterol (PROVENTIL) (2.5 MG/3ML) 0.083% nebulizer solution Take 3 mLs (2.5 mg total) by nebulization every 6 (six) hours as needed for wheezing or shortness of breath. 215 mL 5  . ALPRAZolam (XANAX XR) 3 MG 24 hr tablet Take 3 mg by mouth 2 (two) times daily. 1 MG IN THE MORNING AND 3 MG IN THE EVENING    . ALPRAZolam (XANAX) 1 MG tablet Take 1 mg by mouth daily.   5  . aspirin 81 MG EC tablet Take 1 tablet (81 mg total) by mouth daily. 90 tablet 3  . atorvastatin (LIPITOR) 80 MG tablet Take 1 tablet (80 mg total) by mouth daily. 90 tablet 3  . clopidogrel (PLAVIX) 75 MG tablet Take 1 tablet (75 mg total) by mouth daily. 30 tablet 11  . DALIRESP 500 MCG TABS tablet TAKE 1 TABLET BY MOUTH DAILY. IF NEEDED, TRY TO TAKE 1 TABLET EVERY OTHER DAY 90 tablet 1  . ezetimibe (ZETIA) 10 MG tablet Take 1 tablet (10 mg total) by mouth daily. 90 tablet 3  . Fluticasone-Umeclidin-Vilant (TRELEGY ELLIPTA) 100-62.5-25 MCG/INH AEPB Inhale 1 puff into the lungs daily. 60 each 0  . furosemide (LASIX) 20 MG tablet Take 20 mg by mouth daily.     . hydrALAZINE (APRESOLINE) 25 MG tablet Take 25 mg by mouth as needed.    Marland Kitchen ipratropium (ATROVENT) 0.06 % nasal spray Place 2 sprays into both nostrils 4 (four) times daily. 15 mL 6  . meclizine (ANTIVERT) 12.5 MG tablet Take 1 tablet (12.5 mg total) by mouth 3 (three) times daily as needed for dizziness. 30 tablet 0  . metoprolol succinate (TOPROL-XL) 100 MG 24 hr tablet TAKE 1 TABLET(100 MG) BY MOUTH DAILY WITH OR IMMEDIATELY FOLLOWING A MEAL 90 tablet 4  . nitroGLYCERIN (NITROSTAT) 0.4 MG SL tablet Place 1 tablet (0.4 mg total) under the tongue every 5 (five) minutes as needed for chest pain. 25 tablet 1  . omeprazole (PRILOSEC) 40 MG capsule Take 40 mg by  mouth 2 (two) times daily.    . OXYGEN Inhale 2 L into the lungs daily as needed (oxygen).      No  current facility-administered medications on file prior to visit.    Review of Systems    He reports residual right-sided numbness and weakness s/p stroke.  He denies chest pain, palpitations, n, v, dizziness, syncope, increased edema, weight gain, or early satiety.  He reports breathing/dyspnea at baseline on oxygen.  All other systems reviewed and are otherwise negative except as noted above.  Physical Exam    VS:  BP 136/72 (BP Location: Left Arm, Patient Position: Sitting, Cuff Size: Normal)   Pulse 75   Ht 5\' 9"  (1.753 m)   Wt 179 lb (81.2 kg)   SpO2 97%   BMI 26.43 kg/m  , BMI Body mass index is 26.43 kg/m. GEN: Well nourished, well developed, in no acute distress.  Joined by his wife. HEENT: normal. Neck: Supple, no JVD, carotid bruits, or masses.  On nasal cannula oxygen/chronic oxygen. Cardiac: RRR, no murmurs, rubs, or gallops. No clubbing,  no edema.  Radials/DP/PT 1+ and equal bilaterally.  Respiratory: Reduced breath sounds bilaterally, clear to auscultation bilaterally.  On 2 L nasal cannula oxygen. GI: Soft, nontender, nondistended, BS + x 4. MS: no deformity or atrophy. Skin: warm and dry, no rash. Neuro:  Strength and sensation are intact. Psych: Normal affect.  Accessory Clinical Findings    ECG personally reviewed by me today -NSR, T wave inversion noted in V4 to V6 is seen in previous EKGs, 77 bpm, PR interval 150 ms, QTC 432 ms- no acute changes.  VITALS Reviewed today   Temp Readings from Last 3 Encounters:  05/08/20 98.5 F (36.9 C) (Oral)  03/20/20 97.7 F (36.5 C) (Temporal)  12/05/19 97.9 F (36.6 C)   BP Readings from Last 3 Encounters:  05/15/20 136/72  05/08/20 138/68  03/20/20 (!) 144/82   Pulse Readings from Last 3 Encounters:  05/15/20 75  05/08/20 79  03/20/20 72    Wt Readings from Last 3 Encounters:  05/15/20 179 lb (81.2 kg)   05/07/20 175 lb (79.4 kg)  03/20/20 174 lb (78.9 kg)     LABS  reviewed today   Lab Results  Component Value Date   WBC 8.3 05/08/2020   HGB 12.6 (L) 05/08/2020   HCT 37.0 (L) 05/08/2020   MCV 85.6 05/08/2020   PLT 212 05/08/2020   Lab Results  Component Value Date   CREATININE 1.75 (H) 05/08/2020   BUN 15 05/08/2020   NA 138 05/08/2020   K 3.7 05/08/2020   CL 102 05/08/2020   CO2 27 05/08/2020   Lab Results  Component Value Date   ALT 16 05/07/2020   AST 17 05/07/2020   ALKPHOS 114 05/07/2020   BILITOT 0.9 05/07/2020   Lab Results  Component Value Date   CHOL 98 05/08/2020   HDL 31 (L) 05/08/2020   LDLCALC 51 05/08/2020   TRIG 79 05/08/2020   CHOLHDL 3.2 05/08/2020    Lab Results  Component Value Date   HGBA1C 5.3 05/08/2020   Lab Results  Component Value Date   TSH 1.928 08/13/2019     STUDIES/PROCEDURES reviewed today   TTE 05/08/20 1. Left ventricular ejection fraction, by estimation, is 55 to 60%. The  left ventricle has normal function. The left ventricle has no regional  wall motion abnormalities. Left ventricular diastolic parameters are  consistent with Grade I diastolic  dysfunction (impaired relaxation).  2. Right ventricular systolic function is normal. The right ventricular  size is normal.   CTA neck 05/2020 IMPRESSION: No large vessel occlusion. Plaque at  the ICA origins causes less than 50% stenosis. Marked stenosis at the origin of the non dominant right vertebral artery. Marked marked stenosis of the right paraclinoid intracranial ICA. Small clustered nodularity in the left upper lobe is likely infectious/inflammatory in etiology. Stroke  CTA Head 05/2020 IMPRESSION: No large vessel occlusion. Plaque at the ICA origins causes less than 50% stenosis. Marked stenosis at the origin of the non dominant right vertebral artery. Marked marked stenosis of the right paraclinoid intracranial ICA. Small clustered nodularity in the  left upper lobe is likely infectious/inflammatory in etiology. Stroke  CT Head 05/2020 IMPRESSION: Infarct in the inferior left frontal lobe is new since the prior head CT. The infarct can not be definitively characterized but it appears subacute to late subacute. Chronic microvascular ischemic changes progressed since the prior exam. Cortical atrophy noted.  Cardiac monitoring 11/20/2018 Normal sinus rhythm Avg HR of 104 bpm. 2 Supraventricular Tachycardia runs occurred, the run with the fastest interval lasting 6 beats with a max rate of 152 bpm,  the longest lasting 8 beats with an avg rate of 111 bpm.  Isolated SVEs were rare (<1.0%), SVE Couplets were rare (<1.0%), and SVE Triplets were rare (<1.0%). Isolated VEs were rare (<1.0%), VE Couplets were rare (<1.0%), and no VE Triplets were present. Ventricular Bigeminy was present. Patient triggered events were not associated with significant arrhythmia  LHC 09/2018  Mid Cx to Dist Cx lesion is 90% stenosed.  Ost 1st Diag to 1st Diag lesion is 80% stenosed.  Previously placed Prox RCA to Mid RCA stent (unknown type) is widely patent.  Prox RCA lesion is 35% stenosed.  The left ventricular ejection fraction is 55-65% by visual estimate.  The left ventricular systolic function is normal.  LV end diastolic pressure is normal.  There is no mitral valve regurgitation.  Prox Cx lesion is 30% stenosed. Recommendations:  Etiology of his chest pain could be secondary to small vessel disease Pictures reviewed by interventional cardiology Medical management recommended We will recommend he start Ranexa 500 mg twice daily for 1 week then up to 1000 mg twice daily Continue other outpatient medications  Assessment & Plan    Recent TIA/CVA --05/2020 hospitalization for TIA as in HPI.  Please see under studies/procedures reviewed today for copied and pasted imaging of head, neck/carotids, and TTE performed at hospital.  Today, he  denies any recent racing heart rate or palpitations.  He does have a history of palpitations and paroxysmal SVT with 11/2018 Zio monitor as above.  Given most recent admission, will repeat 2-week ZIO XT monitor to rule out atrial fibrillation/embolic stroke.  Continue current medications, including neurology recommended ASA and Plavix.  Recommend ongoing cessation of tobacco use and aggressive risk factor modification.  Follow-up with neurology.  CAD s/p multiple catheterizations --As above in HPI. Most recent 09/2018 catheterization as above with recommendation for aggressive medical therapy.  He reports previous intolerance to Ranexa.  Denies any recent chest pain or symptoms consistent with angina.  No indication for repeat ischemic evaluation at this time.  Continue current medications.  Continue aggressive risk factor modification, including high intensity statin.  Carotid artery dz, bilateral --Recent CTA of neck shows right and left bilateral carotid disease with less than 50% stenosis.  Denies any amaurosis fugax today.  No presyncope or syncope.  Continue to monitor with periodic imaging.  Recommend continue ASA and Plavix, as well as aggressive risk factor modification with statin.  Ongoing smoking cessation recommended.  HLD, LDL goal  less than 70 --Most recent LDL 51.  Continue current statin and Zetia.  Prolonged QT, resolved --Reviewed this finding with the patient.  EKG today shows improved QT.  Chronic hypoxic respiratory failure secondary to COPD --Continues on 2 L nasal cannula oxygen.  Continue to follow with pulmonology as directed.  Congratulated on quitting smoking/vaping.  Ongoing smoking cessation advised.  Paroxysmal SVT/ ectopy --As above. Zio 11/2018 with 2 runs of SVT, ectopy.  Denies any racing heart rate or palpitations.  Obtain repeat Zio XT x2 weeks as above to rule out atrial fibrillation/flutter or embolic source of stroke.  Continue current Toprol-XL 100 mg  daily.  Chronic HFpEF --Reports breathing at baseline.  Known history of COPD.  05/07/2020 echo performed and showed EF 55 to 60%, NRWMA, G1DD.  Euvolemic and well compensated on exam.  Continue current Lasix.  No medication changes.  Labile hypertension --History of orthostatic hypotension.  Does not check blood pressure at home.  Encouraged home BP checks.  BP today relatively well controlled/borderline at 136/72.  Continue current medications, including hydralazine at home for breakthrough hypertension.  History of tobacco use --Reports quit vaping recently.  Ongoing cessation encouraged.  CKD --Continue to monitor with periodic BMET.  Most recent creatinine 1.75 while admitted.   Disposition: RTC after ambulatory cardiac monitoring with Zio XT x2 weeks.  Follow-up with neurology.  Arvil Chaco, PA-C 05/15/2020

## 2020-05-15 NOTE — Patient Instructions (Signed)
Medication Instructions:  Your physician recommends that you continue on your current medications as directed. Please refer to the Current Medication list given to you today.  *If you need a refill on your cardiac medications before your next appointment, please call your pharmacy*  Testing:  ZIO MONITOR for 14 days. Your physician has recommended that you wear a Zio monitor. This monitor is a medical device that records the heart's electrical activity. Doctors most often use these monitors to diagnose arrhythmias. Arrhythmias are problems with the speed or rhythm of the heartbeat. The monitor is a small device applied to your chest. You can wear one while you do your normal daily activities. While wearing this monitor if you have any symptoms to push the button and record what you felt. Once you have worn this monitor for the period of time provider prescribed (Usually 14 days), you will return the monitor device in the postage paid box. Once it is returned they will download the data collected and provide [place] with a report which the provider will then review and we will call you with those results. Important tips:  1. Avoid showering during the first 24 hours of wearing the monitor. 2. Avoid excessive sweating to help maximize wear time. 3. Do not submerge the device, no hot tubs, and no swimming pools. 4. Keep any lotions or oils away from the patch. 5. After 24 hours you may shower with the patch on. Take brief showers with your back facing the shower head.  6. Do not remove patch once it has been placed because that will interrupt data and decrease adhesive wear time. 7. Push the button when you have any symptoms and write down what you were feeling. 8. Once you have completed wearing your monitor, remove and place into box which has postage paid and place in your outgoing mailbox.  9. If for some reason you have misplaced your box then call our office and we can provide another box and/or mail  it off for you.  Follow-Up: At [facility], you and your health needs are our priority.  As part of our continuing mission to provide you with exceptional heart care, we have created designated Provider Care Teams.  These Care Teams include your primary Cardiologist (physician) and Advanced Practice Providers (APPs -  Physician Assistants and Nurse Practitioners) who all work together to provide you with the care you need, when you need it.  We recommend signing up for the patient portal called "MyChart".  Sign up information is provided on this After Visit Summary.  MyChart is used to connect with patients for Virtual Visits (Telemedicine).  Patients are able to view lab/test results, encounter notes, upcoming appointments, etc.  Non-urgent messages can be sent to your provider as well.   To learn more about what you can do with MyChart, go to [email or web address].    Your next appointment:   4 week(s)  The format for your next appointment:   In Person  Provider:   You may see Ida Rogue, MD or one of the following Advanced Practice Providers on your designated Care Team:    Murray Hodgkins, NP  Christell Faith, PA-C  Marrianne Mood, PA-C  Cadence York, [place]  Laurann Montana, NP

## 2020-05-16 DIAGNOSIS — I272 Pulmonary hypertension, unspecified: Secondary | ICD-10-CM | POA: Diagnosis not present

## 2020-05-16 DIAGNOSIS — Z6826 Body mass index (BMI) 26.0-26.9, adult: Secondary | ICD-10-CM | POA: Diagnosis not present

## 2020-05-16 DIAGNOSIS — I10 Essential (primary) hypertension: Secondary | ICD-10-CM | POA: Diagnosis not present

## 2020-05-16 DIAGNOSIS — J449 Chronic obstructive pulmonary disease, unspecified: Secondary | ICD-10-CM | POA: Diagnosis not present

## 2020-05-16 DIAGNOSIS — G459 Transient cerebral ischemic attack, unspecified: Secondary | ICD-10-CM | POA: Diagnosis not present

## 2020-05-16 DIAGNOSIS — Z79899 Other long term (current) drug therapy: Secondary | ICD-10-CM | POA: Diagnosis not present

## 2020-05-16 DIAGNOSIS — Z8673 Personal history of transient ischemic attack (TIA), and cerebral infarction without residual deficits: Secondary | ICD-10-CM | POA: Diagnosis not present

## 2020-05-16 DIAGNOSIS — R9431 Abnormal electrocardiogram [ECG] [EKG]: Secondary | ICD-10-CM | POA: Diagnosis not present

## 2020-06-10 DIAGNOSIS — Z8673 Personal history of transient ischemic attack (TIA), and cerebral infarction without residual deficits: Secondary | ICD-10-CM | POA: Diagnosis not present

## 2020-06-16 ENCOUNTER — Other Ambulatory Visit: Payer: Self-pay

## 2020-06-16 ENCOUNTER — Encounter: Payer: Self-pay | Admitting: Physician Assistant

## 2020-06-16 ENCOUNTER — Ambulatory Visit: Payer: HMO | Admitting: Physician Assistant

## 2020-06-16 VITALS — BP 140/80 | HR 80 | Ht 69.0 in | Wt 182.1 lb

## 2020-06-16 DIAGNOSIS — I5032 Chronic diastolic (congestive) heart failure: Secondary | ICD-10-CM

## 2020-06-16 DIAGNOSIS — J449 Chronic obstructive pulmonary disease, unspecified: Secondary | ICD-10-CM

## 2020-06-16 DIAGNOSIS — I2510 Atherosclerotic heart disease of native coronary artery without angina pectoris: Secondary | ICD-10-CM | POA: Diagnosis not present

## 2020-06-16 DIAGNOSIS — I471 Supraventricular tachycardia: Secondary | ICD-10-CM | POA: Diagnosis not present

## 2020-06-16 DIAGNOSIS — E785 Hyperlipidemia, unspecified: Secondary | ICD-10-CM | POA: Diagnosis not present

## 2020-06-16 DIAGNOSIS — Z9861 Coronary angioplasty status: Secondary | ICD-10-CM | POA: Diagnosis not present

## 2020-06-16 DIAGNOSIS — Z87891 Personal history of nicotine dependence: Secondary | ICD-10-CM

## 2020-06-16 DIAGNOSIS — I10 Essential (primary) hypertension: Secondary | ICD-10-CM

## 2020-06-16 DIAGNOSIS — N183 Chronic kidney disease, stage 3 unspecified: Secondary | ICD-10-CM

## 2020-06-16 DIAGNOSIS — I639 Cerebral infarction, unspecified: Secondary | ICD-10-CM | POA: Diagnosis not present

## 2020-06-16 NOTE — Patient Instructions (Signed)
Medication Instructions:  Your physician recommends that you continue on your current medications as directed. Please refer to the Current Medication list given to you today.  *If you need a refill on your cardiac medications before your next appointment, please call your pharmacy*   Lab Work: None ordered   Testing/Procedures: None ordered   Follow-Up: At [facility], you and your health needs are our priority.  As part of our continuing mission to provide you with exceptional heart care, we have created designated Provider Care Teams.  These Care Teams include your primary Cardiologist (physician) and Advanced Practice Providers (APPs -  Physician Assistants and Nurse Practitioners) who all work together to provide you with the care you need, when you need it.  We recommend signing up for the patient portal called "MyChart".  Sign up information is provided on this After Visit Summary.  MyChart is used to connect with patients for Virtual Visits (Telemedicine).  Patients are able to view lab/test results, encounter notes, upcoming appointments, etc.  Non-urgent messages can be sent to your provider as well.   To learn more about what you can do with MyChart, go to [email or web address].    Your next appointment:    Follow up with Dr. Rockey Situ after you have seen Dr. Quentin Ore (EP Referral)  The format for your next appointment:   In Person  Provider:   You may see Ida Rogue, MD    Other Instructions 1)  You have been referred to Cardiology Electrophysiology with Dr. Quentin Ore 2)  Your MyChart password has been changed to what you requested in the office. Please let us know if you have any problems logging in after this was changed. [phone]

## 2020-06-16 NOTE — Progress Notes (Signed)
Office Visit    Patient Name: William Mueller Date of Encounter: 06/16/2020  Primary Care Provider:  Ronal Fear, NP Primary Cardiologist:  Julien Nordmann, MD  Chief Complaint    Chief Complaint  Patient presents with   Follow-up    4 week/ discuss Zio monitor.    70 yo male with a hx of cryptogenic stroke with cardiac monitoring showing PAT/PSVT/?Junctional rhythm pending official read, CAD s/p 2015 PCI to RCA and repeat catheterization 2016 /2017/2020, chronic chest pain with negative enzymes, paroxysmal atrial tachycardia, COPD on 2 L of oxygen, obesity, hypertension, hyperlipidemia, HFrEF, CKD 3, GERD, anxiety, ?poor self reported pt tolerance of anesthesia?, prior history of smoking with h/o e-cigarette use /recently quit both smoking and vaping, and who presents today for recent cardiac monitoring s/p 05/2020 CVA.  Past Medical History    Past Medical History:  Diagnosis Date   Anginal pain (HCC) 06/2017   Anxiety    Bronchitis 06/2017   Chronic chest pain    Chronic kidney disease (CKD) stage G3b/A1, moderately decreased glomerular filtration rate (GFR) between 30-44 mL/min/1.73 square meter and albuminuria creatinine ratio less than 30 mg/g (HCC)    Chronic lower back pain    WITH LEG WEAKNESS   COPD (chronic obstructive pulmonary disease) (HCC)    EMPHYSEMA, O2 AT 2L PRN   Coronary artery disease    a. 12/2013 PCI: mRCA 100% with L to R collats s/p PCI/DES;  b. 06/2014 MV: no ischemia/infarct, EF 53%;  c. 03/2015 Cath: LM nl, LAD nl, D1 80 (1.51mm), LCX 80m (<1.51mm), OM1 nl, RCA 55p, patent stent, RPDA nl, EF 65%; d. 09/2015 Cath: LM nl, LAD 84m, D1 80 (<55mm), LCX 42m/d (<1.46mm), OM1 nl, RCA 55p (FFR 0.93), patent stent, RPDA nl-->Med Rx.   Daily headache    Depression    Diastolic dysfunction    a. echo 12/2013: EF 55-60%, mild LVH, GR1DD, inf HK, elevated CVP, mildly dilated IVC suggestive of increased RA pressure   Dyspnea    WHEEZING   GERD  (gastroesophageal reflux disease)    REFLUX   Hearing loss    High cholesterol    Hypertension    Myocardial infarction (HCC) 2015   Nicotine addiction    a. using eCigs.   Orthopnea    Sleep apnea    Vertigo    Past Surgical History:  Procedure Laterality Date   CARDIAC CATHETERIZATION     MC x 1 stent   CARDIAC CATHETERIZATION Left 03/12/2015   Procedure: Left Heart Cath and Coronary Angiography;  Surgeon: Marykay Lex, MD;  Location: [facility] INVASIVE CV LAB;  Service: Cardiovascular;  Laterality: Left;   CARDIAC CATHETERIZATION N/A 09/29/2015   Procedure: Left Heart Cath and Coronary Angiography;  Surgeon: Iran Ouch, MD;  Location: [facility] INVASIVE CV LAB;  Service: Cardiovascular;  Laterality: N/A;   CATARACT EXTRACTION W/PHACO Right 08/10/2017   Procedure: CATARACT EXTRACTION PHACO AND INTRAOCULAR LENS PLACEMENT (IOC) RIGHT;  Surgeon: Lockie Mola, MD;  Location: [facility]GERY [facility];  Service: Ophthalmology;  Laterality: Right;   CORONARY ANGIOPLASTY     STENT PLACEMENT   LEFT HEART CATH AND CORONARY ANGIOGRAPHY N/A 09/06/2018   Procedure: LEFT HEART CATH AND CORONARY ANGIOGRAPHY;  Surgeon: Antonieta Iba, MD;  Location: [facility] INVASIVE CV LAB;  Service: Cardiovascular;  Laterality: N/A;   LEFT HEART CATHETERIZATION WITH CORONARY ANGIOGRAM N/A 12/12/2013   Procedure: LEFT HEART CATHETERIZATION WITH CORONARY ANGIOGRAM;  Surgeon: Iran Ouch, MD;  Location:  [place] CATH LAB;  Service: Cardiovascular;  Laterality: N/A;    Allergies  Allergies  Allergen Reactions   Paroxetine Hcl Shortness Of Breath and Palpitations   Serotonin Reuptake Inhibitors (Ssris) Shortness Of Breath and Palpitations   Diazepam Other (See Comments)    Rapid heartrate   Doxycycline Monohydrate Other (See Comments)    Unknown allergic reaction   Escitalopram Oxalate Other (See Comments)    serotonin Syndrome   Lorazepam Other (See Comments)    Unknown allergic reaction    Tetracyclines & Related Itching and Rash    History of Present Illness    William Mueller is a 70 y.o. male with PMH as above. He lives a sedentary lifestyle without any exercise program. He  has a history of both tobacco use and E cigarettes/vaping and reports ongoing cessation today.   Of note, he is reportedly unable to receive anesthesia per pulmonology recommendations.  He has a h/o CAD with 12/2013 cardiac catheterization for CP and PCI/DES to RCA.  He underwent repeat cardiac cath in October 2016 for chest pain that showed patent stent and disease of small diagonal and distal circumflex but not amenable to intervention.  09/2015 cath for chest pain showed patent RCA stent and stable disease to proximal LAD, small first diagonal and the AV groove branch of the left circumflex with recommendation for medical therapy.  He was admitted 09/2017 for chest pain and ruled out with medical therapy advised.    09/2018 cath for recurrent CP that woke him from sleep showed etiology of chest pain could be secondary to small vessel disease.  Images were reviewed by interventional cardiology with recommendation for medical management.  He was started on Ranexa 500 mg twice daily for 1 week with plan to go up to 1000 mg twice daily thereafter.  Seen at follow-up in clinic and reportedly had stopped Ranexa due to a jittery feeling.    He was diagnosed with COPD and spring 2020.  11/2018 ZIO monitor was performed for palpitations and showed NSR with 2 runs of SVT, as well as ectopy.  Toprol was increased.    12/2018 echo showed EF 60 to 42% and diastolic dysfunction.  He was seen in the office 01/21/2020 for 56-month follow-up by his primary cardiologist, Dr. Rockey Situ.  He reported his breathing was doing well.  He was having stretches of oxygen saturation in the 90s without oxygen supplementation.  He had significant hand pain that was worse in the mornings when waking up.  He also reported constipation with  colonoscopy canceled secondary to lung issues/COPD.  BP was noted to be well controlled.  On 05/07/2020, his wife called the office with concern that the patient may have had a stroke the previous evening.  Recommendation was to present to the emergency department, given his symptoms were concerning for stroke. He was admitted from the emergency department 12/1 for complaints of numbness to the right side of his face and mild weakness without definite timeframe onset.  CT head in the ED showed new infarct of the inferior left frontal lobe.  Due to claustrophobia, he did not undergo MRI with neurology recommendation for ASA and Plavix indefinitely.    CTA of the head and neck did not show any large vessel occlusion and showed mild bilateral carotid disease as copied and pasted below.    Echo was performed with normal LV function.  Prolonged QT was noted on EKG.    He was last seen in clinic 05/15/2020 and  continued to note right-sided numbness and weakness.  He was continued on oxygen and reported breathing at baseline.  No chest pain.  He had quit smoking and vaping with encouragement provided for ongoing smoking cessation.  Cardiac monitoring was recommended for further work-up of his stroke.  Preliminary cardiac monitoring findings showed minimum heart rate of 45 bpm and maximum heart rate of 138 bpm with an average heart rate of 66 bpm.  The predominant underlying rhythm was NSR.  Slight P wave morphology changes were noted throughout the recording.  8 SVT runs occurred with the fastest interval lasting 5 beats with max rate 138 bpm and the longest 13 beats with an average rate of 110 bpm.  Some episodes of SVT thought possibly 2/2 atrial tachycardia with variable block.  Possible junctional rhythm was present.  Isolated PACs were rare, couplets were rare, triplets were rare.  Isolated PVCs were rare, ventricular couplets were rare, ventricular triplets were rare.  Today, 06/16/2020, he reports  improvement in symptoms when compared with previous visit.  He notes improved right sided numbness and weakness.  He no longer feels as if he is leaning as much to his right side.  His wife continues to note unsteadiness with patient reports that he still feels as if he has improved.  No chest pain.  He notes stable shortness of breath /dyspnea on oxygen. No racing HR or palpitations. No dizziness. He continues to abstain from smoking and vaping with ongoing encouragement provided.  Cardiac monitoring as above reviewed.  No signs or symptoms of bleeding.  He does report recent mechanical falls in the yard; however, he did not fall and hit his head.    Home Medications    Current Outpatient Medications on File Prior to Visit  Medication Sig Dispense Refill   acetaminophen (TYLENOL) 500 MG tablet Take 1,000 mg by mouth daily as needed for headache.      albuterol (PROVENTIL HFA;VENTOLIN HFA) 108 (90 Base) MCG/ACT inhaler Inhale into the lungs every 6 (six) hours as needed for wheezing or shortness of breath.     albuterol (PROVENTIL) (2.5 MG/3ML) 0.083% nebulizer solution Take 3 mLs (2.5 mg total) by nebulization every 6 (six) hours as needed for wheezing or shortness of breath. 215 mL 5   ALPRAZolam (XANAX XR) 3 MG 24 hr tablet Take 3 mg by mouth 2 (two) times daily. 1 MG IN THE MORNING AND 3 MG IN THE EVENING     ALPRAZolam (XANAX) 1 MG tablet Take 1 mg by mouth daily.   5   aspirin 81 MG EC tablet Take 1 tablet (81 mg total) by mouth daily. 90 tablet 3   atorvastatin (LIPITOR) 80 MG tablet Take 1 tablet (80 mg total) by mouth daily. 90 tablet 3   clopidogrel (PLAVIX) 75 MG tablet Take 1 tablet (75 mg total) by mouth daily. 30 tablet 11   DALIRESP 500 MCG TABS tablet TAKE 1 TABLET BY MOUTH DAILY. IF NEEDED, TRY TO TAKE 1 TABLET EVERY OTHER DAY 90 tablet 1   ezetimibe (ZETIA) 10 MG tablet Take 1 tablet (10 mg total) by mouth daily. 90 tablet 3   Fluticasone-Umeclidin-Vilant (TRELEGY  ELLIPTA) 100-62.5-25 MCG/INH AEPB Inhale 1 puff into the lungs daily. 60 each 0   furosemide (LASIX) 20 MG tablet Take 20 mg by mouth daily.      hydrALAZINE (APRESOLINE) 25 MG tablet Take 25 mg by mouth as needed.     ipratropium (ATROVENT) 0.06 % nasal spray Place 2  sprays into both nostrils 4 (four) times daily. 15 mL 6   meclizine (ANTIVERT) 12.5 MG tablet Take 1 tablet (12.5 mg total) by mouth 3 (three) times daily as needed for dizziness. 30 tablet 0   metoprolol succinate (TOPROL-XL) 100 MG 24 hr tablet TAKE 1 TABLET(100 MG) BY MOUTH DAILY WITH OR IMMEDIATELY FOLLOWING A MEAL 90 tablet 4   nitroGLYCERIN (NITROSTAT) 0.4 MG SL tablet Place 1 tablet (0.4 mg total) under the tongue every 5 (five) minutes as needed for chest pain. 25 tablet 1   omeprazole (PRILOSEC) 40 MG capsule Take 40 mg by mouth 2 (two) times daily.     OXYGEN Inhale 2 L into the lungs daily as needed (oxygen).      No current facility-administered medications on file prior to visit.    Review of Systems    He reports improved right-sided numbness and weakness s/p stroke.  He denies chest pain, palpitations, n, v, dizziness, syncope, increased edema, weight gain, or early satiety.  He reports breathing/dyspnea at baseline on oxygen.  All other systems reviewed and are otherwise negative except as noted above.  Physical Exam    VS:  BP 140/80 (BP Location: Left Arm, Patient Position: Sitting, Cuff Size: Normal)    Pulse 80    Ht 5\' 9"  (1.753 m)    Wt 182 lb 2 oz (82.6 kg)    SpO2 97% Comment: oxygen at 2 liters   BMI 26.90 kg/m  , BMI Body mass index is 26.9 kg/m. GEN: Well nourished, well developed, in no acute distress.  Joined by his wife. HEENT: normal. Neck: Supple, no JVD, carotid bruits, or masses.  On nasal cannula oxygen/chronic oxygen. Cardiac: RRR, no murmurs, rubs, or gallops. No clubbing,  no edema.  Radials/DP/PT 1+ and equal bilaterally.  Respiratory: Reduced breath sounds bilaterally, clear to  auscultation bilaterally.  On 2 L nasal cannula oxygen. GI: Soft, nontender, nondistended, BS + x 4. MS: no deformity or atrophy. Skin: warm and dry, no rash. Neuro:  Strength and sensation are intact. Psych: Normal affect.  Accessory Clinical Findings    ECG personally reviewed by me today -NSR, T wave inversion noted in V4 to V6 is seen in previous EKGs, 80 bpm, PR interval 148 ms, QTC 410 ms- no acute changes.  VITALS Reviewed today   Temp Readings from Last 3 Encounters:  05/08/20 98.5 F (36.9 C) (Oral)  03/20/20 97.7 F (36.5 C) (Temporal)  12/05/19 97.9 F (36.6 C)   BP Readings from Last 3 Encounters:  06/16/20 140/80  05/15/20 136/72  05/08/20 138/68   Pulse Readings from Last 3 Encounters:  06/16/20 80  05/15/20 75  05/08/20 79    Wt Readings from Last 3 Encounters:  06/16/20 182 lb 2 oz (82.6 kg)  05/15/20 179 lb (81.2 kg)  05/07/20 175 lb (79.4 kg)     LABS  reviewed today   Lab Results  Component Value Date   WBC 8.3 05/08/2020   HGB 12.6 (L) 05/08/2020   HCT 37.0 (L) 05/08/2020   MCV 85.6 05/08/2020   PLT 212 05/08/2020   Lab Results  Component Value Date   CREATININE 1.75 (H) 05/08/2020   BUN 15 05/08/2020   NA 138 05/08/2020   K 3.7 05/08/2020   CL 102 05/08/2020   CO2 27 05/08/2020   Lab Results  Component Value Date   ALT 16 05/07/2020   AST 17 05/07/2020   ALKPHOS 114 05/07/2020   BILITOT 0.9 05/07/2020  Lab Results  Component Value Date   CHOL 98 05/08/2020   HDL 31 (L) 05/08/2020   LDLCALC 51 05/08/2020   TRIG 79 05/08/2020   CHOLHDL 3.2 05/08/2020    Lab Results  Component Value Date   HGBA1C 5.3 05/08/2020   Lab Results  Component Value Date   TSH 1.928 08/13/2019     STUDIES/PROCEDURES reviewed today   Zio XT 05/15/20 (not yet resulted, preliminary report typed up below) Preliminary cardiac monitoring findings showed minimum heart rate of 45 bpm and maximum heart rate of 138 bpm with an average heart rate of  66 bpm.  The predominant underlying rhythm was NSR.  Slight P wave morphology changes were noted throughout the recording.  8 SVT runs occurred with the fastest interval lasting 5 beats with max rate 138 bpm and the longest 13 beats with an average rate of 110 bpm.  Some episodes of SVT thought possibly 2/2 atrial tachycardia with variable block.  Possible junctional rhythm was present.  Isolated PACs were rare, couplets were rare, triplets were rare.  Isolated PVCs were rare, ventricular couplets were rare, ventricular triplets were rare.  TTE 05/08/20 1. Left ventricular ejection fraction, by estimation, is 55 to 60%. The  left ventricle has normal function. The left ventricle has no regional  wall motion abnormalities. Left ventricular diastolic parameters are  consistent with Grade I diastolic  dysfunction (impaired relaxation).  2. Right ventricular systolic function is normal. The right ventricular  size is normal.   CTA neck 05/2020 IMPRESSION: No large vessel occlusion. Plaque at the ICA origins causes less than 50% stenosis. Marked stenosis at the origin of the non dominant right vertebral artery. Marked marked stenosis of the right paraclinoid intracranial ICA. Small clustered nodularity in the left upper lobe is likely infectious/inflammatory in etiology. Stroke  CTA Head 05/2020 IMPRESSION: No large vessel occlusion. Plaque at the ICA origins causes less than 50% stenosis. Marked stenosis at the origin of the non dominant right vertebral artery. Marked marked stenosis of the right paraclinoid intracranial ICA. Small clustered nodularity in the left upper lobe is likely infectious/inflammatory in etiology. Stroke  CT Head 05/2020 IMPRESSION: Infarct in the inferior left frontal lobe is new since the prior head CT. The infarct can not be definitively characterized but it appears subacute to late subacute. Chronic microvascular ischemic changes progressed since the  prior exam. Cortical atrophy noted.  Cardiac monitoring 11/20/2018 Normal sinus rhythm Avg HR of 104 bpm. 2 Supraventricular Tachycardia runs occurred, the run with the fastest interval lasting 6 beats with a max rate of 152 bpm,  the longest lasting 8 beats with an avg rate of 111 bpm.  Isolated SVEs were rare (<1.0%), SVE Couplets were rare (<1.0%), and SVE Triplets were rare (<1.0%). Isolated VEs were rare (<1.0%), VE Couplets were rare (<1.0%), and no VE Triplets were present. Ventricular Bigeminy was present. Patient triggered events were not associated with significant arrhythmia  LHC 09/2018  Mid Cx to Dist Cx lesion is 90% stenosed.  Ost 1st Diag to 1st Diag lesion is 80% stenosed.  Previously placed Prox RCA to Mid RCA stent (unknown type) is widely patent.  Prox RCA lesion is 35% stenosed.  The left ventricular ejection fraction is 55-65% by visual estimate.  The left ventricular systolic function is normal.  LV end diastolic pressure is normal.  There is no mitral valve regurgitation.  Prox Cx lesion is 30% stenosed. Recommendations:  Etiology of his chest pain could be secondary to  small vessel disease Pictures reviewed by interventional cardiology Medical management recommended We will recommend he start Ranexa 500 mg twice daily for 1 week then up to 1000 mg twice daily Continue other outpatient medications  Assessment & Plan    History of TIA/CVA PSVT; PAT with variable block Ectopy: PACs PVCs ?Junctional rhythm --05/2020 hospitalization for TIA as in HPI.   TTE performed at hospital but no TEE. Referred to cardiology to rule out embolic stroke. Cardiac monitoring with Zio XT x2 weeks performed as above but not yet officially read by primary cardiologist.  Monitoring shows variable p wave morphology and runs of SVT with possible AT with variable block. Possible junctional rhythm noted, as well as ectopy as outlined above. Today, he denies any recent racing  heart rate or palpitations.  He reports improving right sided sx. He reports improving energy. Given cardiac monitoring results as above and history of cryptogenic stroke with etiology uknown, and after discussing case and Zio findings in office today prior to visit, will refer to EP for formal consideration of loop recorder. Continue current medications, including ASA and Plavix.  Recommend ongoing cessation of tobacco use and aggressive risk factor modification.  Follow-up with neurology as directed.  CAD s/p multiple catheterizations --No CP or anginal sx. Most recent 09/2018 catheterization as above with recommendation for aggressive medical therapy.  He reports previous intolerance to Ranexa. No indication for repeat ischemic evaluation at this time.  Continue current medications.  Continue aggressive risk factor modification, including high intensity statin.  Carotid artery dz, bilateral --Recent CTA of neck shows right and left bilateral carotid disease with less than 50% stenosis.  Denies any amaurosis fugax.  No recent presyncope or syncope. Continue ASA and Plavix, as well as aggressive risk factor modification with statin.  Ongoing smoking cessation recommended.  HLD, LDL goal less than 70 --Continue current statin and Zetia.   Chronic hypoxic respiratory failure secondary to COPD --Continues on 2 L nasal cannula oxygen.  Continue to follow with pulmonology as directed.  Congratulated on quitting smoking/vaping.  Ongoing smoking cessation advised.  Chronic HFpEF --Reports breathing at baseline.  Euvolemic. Known history of COPD.  05/07/2020 echo performed and showed EF 55 to 60%, NRWMA, G1DD.  Continue current Lasix.  No medication changes.  Labile hypertension --History of orthostatic hypotension.  Does not check blood pressure at home.  Encouraged home BP checks.  BP today elevated.  Continue current medications, including hydralazine at home for breakthrough hypertension. If BP remains  elevated at follow-up visits, recommend adjustment of current antihypertensives for more optimal control.   History of tobacco use --Reports quit vaping recently.  Ongoing cessation encouraged.  CKD --Caution with nephrotoxins.   Disposition: Refer to EP.  Follow-up with neurology.  Arvil Chaco, PA-C 06/16/2020

## 2020-06-26 ENCOUNTER — Encounter: Payer: Self-pay | Admitting: *Deleted

## 2020-06-27 NOTE — Progress Notes (Signed)
[employer] faxed a from to be completed for pt to enroll in the chronic sperical need program with his CHF. Blanche East, PA-C last seen pt 06/16/2020 and verify pt's hx of CHF. Dr. Rockey Situ signed enrollment form. This RN fax back to company.

## 2020-07-01 ENCOUNTER — Encounter: Payer: Self-pay | Admitting: Pulmonary Disease

## 2020-07-01 ENCOUNTER — Other Ambulatory Visit: Payer: Self-pay

## 2020-07-01 ENCOUNTER — Ambulatory Visit (INDEPENDENT_AMBULATORY_CARE_PROVIDER_SITE_OTHER): Payer: HMO | Admitting: Pulmonary Disease

## 2020-07-01 VITALS — BP 140/82 | HR 83 | Temp 97.7°F | Ht 69.0 in | Wt 178.0 lb

## 2020-07-01 DIAGNOSIS — F17291 Nicotine dependence, other tobacco product, in remission: Secondary | ICD-10-CM

## 2020-07-01 DIAGNOSIS — J9611 Chronic respiratory failure with hypoxia: Secondary | ICD-10-CM | POA: Diagnosis not present

## 2020-07-01 DIAGNOSIS — J449 Chronic obstructive pulmonary disease, unspecified: Secondary | ICD-10-CM

## 2020-07-01 DIAGNOSIS — I639 Cerebral infarction, unspecified: Secondary | ICD-10-CM | POA: Diagnosis not present

## 2020-07-01 MED ORDER — TRELEGY ELLIPTA 100-62.5-25 MCG/INH IN AEPB: 1.0000 | INHALATION_SPRAY | Freq: Every day | RESPIRATORY_TRACT | 0 refills | Status: AC

## 2020-07-01 NOTE — Progress Notes (Signed)
Subjective:    Patient ID: William Mueller, male    DOB: 1951-05-02, 70 y.o.   MRN: [id number]  Requesting MD/Service:Self Date of initial consultation:06/02/20by Dr. Merton Border Reason for consultation:Very severe COPD, former smoker, chronic hypoxemic respiratory failure  PT PROFILE: 70y.o.maleformer smoker (approx 100 p-y history, quit 2011), still vaping occasionally previously seen by Dr Arlana Hove Dr. Alva Garnet. I assumed his care after Dr. Azzie Almas on 05/24/2019.  DATA: 09/29/17 CTA chest: no PE. No significant emphysema 12/23/15 PFTs: FVC: 2.10 >2.33 L (47>53 %pred), FEV1: 0.91 >1.04 L (27% >31 %pred), FEV1/FVC: 43%, TLC: L ( %pred), DLCO 46 %pred  INTERVAL: Last seen  03/20/2020. No new respiratory complaint since then.    He had a stroke 05/07/2020, admitted to [facility], residual ataxia and weakness.  HPI 70 year old former smoker (quit 2011) presents for follow-up of COPD with chronic respiratory failure with hypoxia.  The patient had been vaping occasionally since quitting in 2011, he has now stop using vape altogether. He had an infarct in the inferior left frontal lobe for which he required admission at [facility] in the earlier part of December.  Initially did well with some physical therapy in the hospital but now has become weaker and has difficulty with ataxia.  No respiratory symptoms however.  He is to see for this.  He has not had any fevers, chills or sweats.  No increased cough or sputum production.  No hemoptysis.  He notes that Trelegy is helping him.  Compliant with oxygen supplementation.   Review of Systems  A 10 point review of systems was performed and it is as noted above otherwise negative.  Patient Active Problem List   Diagnosis Date Noted  . TIA (transient ischemic attack) 05/07/2020  . Prolonged QT interval 05/07/2020  . Chest pain 09/30/2017  . Hypokalemia 09/29/2017  . Near syncope 09/29/2017  . Orthostatic  hypotension 09/29/2017  . Nicotine addiction   . CAD S/P PCI DES to RCA 03/11/2015  . Diastolic dysfunction   . Chronic chest pain   . Chest pain at rest 11/23/2014  . GERD (gastroesophageal reflux disease) 06/08/2014  . CKD (chronic kidney disease) stage 3, GFR 30-59 ml/min (HCC) 05/04/2014  . Bilateral leg pain 05/04/2014  . Lung nodule < 6cm on CT 05/04/2014  . Abnormal CT scan, kidney 05/04/2014  . Unstable angina ([place]) 05/04/2014  . Atrial tachycardia, paroxysmal ([place]) 12/14/2013  . Atherosclerosis of native coronary artery with unstable angina pectoris ([place]) 12/13/2013  . Labile essential hypertension 12/11/2013  . Acute renal failure superimposed on stage 3 chronic kidney disease ([place]) 12/11/2013  . Hyperlipidemia 12/11/2013  . COPD (chronic obstructive pulmonary disease) ([place]) 12/11/2013   Social History   Tobacco Use  . Smoking status: Former Smoker    Packs/day: 3.00    Years: 48.00    Pack years: 144.00    Types: E-cigarettes, Cigarettes    Quit date: 06/17/2009    Years since quitting: 11.0  . Smokeless tobacco: Never Used  . Tobacco comment: quit e-cigs 03/2020  Substance Use Topics  . Alcohol use: Yes    Alcohol/week: 6.0 standard drinks    Types: 6 Cans of beer per week    Comment: Occasional    Allergies  Allergen Reactions  . Paroxetine Hcl Shortness Of Breath and Palpitations  . Serotonin Reuptake Inhibitors (Ssris) Shortness Of Breath and Palpitations  . Diazepam Other (See Comments)    Rapid heartrate  . Doxycycline Monohydrate Other (See Comments)    Unknown  allergic reaction  . Escitalopram Oxalate Other (See Comments)    serotonin Syndrome  . Lorazepam Other (See Comments)    Unknown allergic reaction  . Tetracyclines & Related Itching and Rash   Current Meds  Medication Sig  . acetaminophen (TYLENOL) 500 MG tablet Take 1,000 mg by mouth daily as needed for headache.   . albuterol (PROVENTIL HFA;VENTOLIN HFA) 108 (90 Base) MCG/ACT inhaler  Inhale into the lungs every 6 (six) hours as needed for wheezing or shortness of breath.  Marland Kitchen albuterol (PROVENTIL) (2.5 MG/3ML) 0.083% nebulizer solution Take 3 mLs (2.5 mg total) by nebulization every 6 (six) hours as needed for wheezing or shortness of breath.  . ALPRAZolam (XANAX XR) 3 MG 24 hr tablet Take 3 mg by mouth 2 (two) times daily. 1 MG IN THE MORNING AND 3 MG IN THE EVENING  . ALPRAZolam (XANAX) 1 MG tablet Take 1 mg by mouth daily.   Marland Kitchen aspirin 81 MG EC tablet Take 1 tablet (81 mg total) by mouth daily.  Marland Kitchen atorvastatin (LIPITOR) 80 MG tablet Take 1 tablet (80 mg total) by mouth daily.  . clopidogrel (PLAVIX) 75 MG tablet Take 1 tablet (75 mg total) by mouth daily.  Marland Kitchen DALIRESP 500 MCG TABS tablet TAKE 1 TABLET BY MOUTH DAILY. IF NEEDED, TRY TO TAKE 1 TABLET EVERY OTHER DAY  . ezetimibe (ZETIA) 10 MG tablet Take 1 tablet (10 mg total) by mouth daily.  . Fluticasone-Umeclidin-Vilant (TRELEGY ELLIPTA) 100-62.5-25 MCG/INH AEPB Inhale 1 puff into the lungs daily.  . furosemide (LASIX) 20 MG tablet Take 20 mg by mouth daily.   . hydrALAZINE (APRESOLINE) 25 MG tablet Take 25 mg by mouth as needed.  Marland Kitchen ipratropium (ATROVENT) 0.06 % nasal spray Place 2 sprays into both nostrils 4 (four) times daily.  . meclizine (ANTIVERT) 12.5 MG tablet Take 1 tablet (12.5 mg total) by mouth 3 (three) times daily as needed for dizziness.  . metoprolol succinate (TOPROL-XL) 100 MG 24 hr tablet TAKE 1 TABLET(100 MG) BY MOUTH DAILY WITH OR IMMEDIATELY FOLLOWING A MEAL  . nitroGLYCERIN (NITROSTAT) 0.4 MG SL tablet Place 1 tablet (0.4 mg total) under the tongue every 5 (five) minutes as needed for chest pain.  Marland Kitchen omeprazole (PRILOSEC) 40 MG capsule Take 40 mg by mouth 2 (two) times daily.  . OXYGEN Inhale 2 L into the lungs daily as needed (oxygen).    Immunization History  Administered Date(s) Administered  . Influenza, Seasonal, Injecte, Preservative Fre 04/08/2015  . Influenza,inj,Quad PF,6+ Mos 03/14/2018  .  Influenza-Unspecified 03/10/2016, 05/22/2019  . Janssen (J&J) SARS-COV-2 Vaccination 08/10/2019  . Pneumococcal-Unspecified 02/05/2013       Objective:   Physical Exam BP 140/82 (BP Location: Left Arm, Cuff Size: Normal)   Pulse 83   Temp 97.7 F (36.5 C) (Temporal)   Ht 5\' 9"  (1.753 m)   Wt 178 lb (80.7 kg)   SpO2 97%   BMI 26.29 kg/m  GENERAL:Well-developed well-nourished, presents in transport chair with portable oxygen concentrator. Chronic use of accessories. No conversational dyspnea. HEAD: Normocephalic, atraumatic.  EYES: Pupils equal, round, reactive to light. No scleral icterus.  MOUTH:Nose/mouth/throat not examined due to masking requirements for COVID 19.  NECK: Supple. No thyromegaly. Trachea midline. No JVD. No adenopathy. PULMONARY:Distant breath sounds, coarse, no other adventitious sounds.  CARDIOVASCULAR: S1 and S2. Regular rate and rhythm.Distant heart tones, no rubs murmurs or gallops heard. GASTROINTESTINAL:Protuberant abdomen, soft. MUSCULOSKELETAL: No joint deformity, no clubbing, no edema.  NEUROLOGIC:No focal deficits noted. Speech  is fluent. SKIN: Intact,warm,dry.At that exam no rashes. PSYCH:Mood and behavior normal.    Assessment & Plan:     ICD-10-CM   1. COPD, very severe ([place])  J44.9    Continue Trelegy and as needed albuterol  2. Chronic hypoxemic respiratory failure (HCC)  J96.11    Continue oxygen at current liter flow, 2 L/min 24/7   3. Ischemic stroke of frontal lobe (HCC)  I63.9    This issue adds complexity to his management Has upcoming appointment with neurology  4. Nicotine dependence, other tobacco product, in remission  F17.291    He was commended on discontinuation of vaping    Meds ordered this encounter  Medications  . Fluticasone-Umeclidin-Vilant (TRELEGY ELLIPTA) 100-62.5-25 MCG/INH AEPB    Sig: Inhale 1 puff into the lungs daily for 1 day.    Dispense:  14 each    Refill:  0    Order Specific Question:    Lot Number?    Answer:   [id number]    Order Specific Question:   Expiration Date?    Answer:   02/05/2022    Order Specific Question:   Manufacturer?    Answer:   [employer] [[id number]]    Order Specific Question:   Quantity    Answer:   1   Discussion:  The patient's main concerns today were related to his loss of balance and inability to walk well.  He had a recent stroke.  I recommend that he make an appointment with his primary care provider Charlott Holler, NP to coordinate physical therapy at home.  He has an upcoming appointment with neurology and they should also have some input into this.  He is to continue using Trelegy Ellipta and oxygen at 2 L/min 24/7.  We will see him in follow-up in 3 months time he is to contact us prior to that time should any new difficulties arise.   Renold Don, MD [facility]   *This note was dictated using voice recognition software/Dragon.  Despite best efforts to proofread, errors can occur which can change the meaning.  Any change was purely unintentional.

## 2020-07-01 NOTE — Patient Instructions (Signed)
I recommend you make an appointment with NP Lam's office so that they can coordinate with the stroke doctor with regards to follow-up studies and need for physical therapy.  They also can help you with your forms that need filling out.  Continue using your Trelegy Ellipta and oxygen as you are doing.  We will see you in follow-up in 3 months time call sooner should any new problems arise.

## 2020-07-03 DIAGNOSIS — Z6826 Body mass index (BMI) 26.0-26.9, adult: Secondary | ICD-10-CM | POA: Diagnosis not present

## 2020-07-03 DIAGNOSIS — Z8673 Personal history of transient ischemic attack (TIA), and cerebral infarction without residual deficits: Secondary | ICD-10-CM | POA: Diagnosis not present

## 2020-07-03 DIAGNOSIS — R42 Dizziness and giddiness: Secondary | ICD-10-CM | POA: Diagnosis not present

## 2020-07-03 DIAGNOSIS — R2689 Other abnormalities of gait and mobility: Secondary | ICD-10-CM | POA: Diagnosis not present

## 2020-07-03 DIAGNOSIS — J449 Chronic obstructive pulmonary disease, unspecified: Secondary | ICD-10-CM | POA: Diagnosis not present

## 2020-07-03 DIAGNOSIS — I272 Pulmonary hypertension, unspecified: Secondary | ICD-10-CM | POA: Diagnosis not present

## 2020-07-04 ENCOUNTER — Encounter: Payer: Self-pay | Admitting: Pulmonary Disease

## 2020-07-07 DIAGNOSIS — H353211 Exudative age-related macular degeneration, right eye, with active choroidal neovascularization: Secondary | ICD-10-CM | POA: Diagnosis not present

## 2020-07-07 DIAGNOSIS — H34832 Tributary (branch) retinal vein occlusion, left eye, with macular edema: Secondary | ICD-10-CM | POA: Diagnosis not present

## 2020-07-16 ENCOUNTER — Ambulatory Visit (INDEPENDENT_AMBULATORY_CARE_PROVIDER_SITE_OTHER): Payer: HMO | Admitting: Cardiology

## 2020-07-16 ENCOUNTER — Other Ambulatory Visit: Payer: Self-pay

## 2020-07-16 ENCOUNTER — Encounter: Payer: Self-pay | Admitting: Cardiology

## 2020-07-16 ENCOUNTER — Other Ambulatory Visit: Payer: Self-pay | Admitting: Family

## 2020-07-16 VITALS — BP 160/90 | HR 75 | Ht 69.0 in | Wt 184.4 lb

## 2020-07-16 DIAGNOSIS — I471 Supraventricular tachycardia: Secondary | ICD-10-CM | POA: Diagnosis not present

## 2020-07-16 DIAGNOSIS — Z95818 Presence of other cardiac implants and grafts: Secondary | ICD-10-CM

## 2020-07-16 DIAGNOSIS — I639 Cerebral infarction, unspecified: Secondary | ICD-10-CM | POA: Diagnosis not present

## 2020-07-16 DIAGNOSIS — E785 Hyperlipidemia, unspecified: Secondary | ICD-10-CM

## 2020-07-16 HISTORY — DX: Presence of other cardiac implants and grafts: Z95.818

## 2020-07-16 NOTE — Patient Instructions (Signed)
Medication Instructions:  - Your physician recommends that you continue on your current medications as directed. Please refer to the Current Medication list given to you today.  *If you need a refill on your cardiac medications before your next appointment, please call your pharmacy*   Lab Work: - none ordered  If you have labs (blood work) drawn today and your tests are completely normal, you will receive your results only by: Marland Kitchen MyChart Message (if you have MyChart) OR . A paper copy in the mail If you have any lab test that is abnormal or we need to change your treatment, we will call you to review the results.   Testing/Procedures: - none ordered   Follow-Up: At [facility], you and your health needs are our priority.  As part of our continuing mission to provide you with exceptional heart care, we have created designated Provider Care Teams.  These Care Teams include your primary Cardiologist (physician) and Advanced Practice Providers (APPs -  Physician Assistants and Nurse Practitioners) who all work together to provide you with the care you need, when you need it.  We recommend signing up for the patient portal called "MyChart".  Sign up information is provided on this After Visit Summary.  MyChart is used to connect with patients for Virtual Visits (Telemedicine).  Patients are able to view lab/test results, encounter notes, upcoming appointments, etc.  Non-urgent messages can be sent to your provider as well.   To learn more about what you can do with MyChart, go to [email or web address].    Your next appointment:   As needed   The format for your next appointment:   n/a  Provider:   Lars Mage, MD   Other Instructions  Post Implant Instructions: 1) You may shower 72 hours post procedure (Saturday 2/12) 2) You may remove your tegaderm (top) dressing 72 hours post procedure (Saturday 2/12) 3) You may remove your steri strips in 1 week (Wednesday 2/16) if they  have not fallen off on their own 4) If you have any bleeding issues or concerns with you implant site after getting home, please call our [facility] directly at [phone].

## 2020-07-16 NOTE — Progress Notes (Signed)
Electrophysiology Office Note:    Date:  07/16/2020   ID:  William Mueller, DOB Jan 26, 1951, MRN [id number]  PCP:  Philmore Pali, NP  [facility] Cardiologist:  Ida Rogue, MD  [facility] Electrophysiologist:  None   Referring MD: Philmore Pali, NP   Chief Complaint: Cryptogenic stroke   History of Present Illness:    William Mueller is a 70 y.o. male who presents for an evaluation of cryptogenic stroke at the request of Lorenso Quarry, PA-C. Their medical history includes coronary artery disease post multiple PCI, COPD on home oxygen, CKD 3B, hypertension, sleep apnea.  He was last seen by Lorenso Quarry, PA-C on June 16, 2020.  Patient's wife called the office on May 07, 2020 concern that her husband may have had a stroke.  He was referred to the emergency department where imaging confirmed an acute infarct in his inferior left frontal lobe.  MRI was not performed due to claustrophobia.  He was recommended to continue aspirin and Plavix indefinitely.  ZIO monitor performed after the event showed no episodes of atrial fibrillation.  No TEE was performed.  He is referred to EP for consideration of loop recorder implant.  Today he tells me he is not very active at home.  The cold weather makes his breathing worse.  He is on 24-hour a day oxygen.  He does not feel any palpitations.  He thinks his neurologic function is nearly back to baseline although his wife tells me that he is unsteady on his feet which she attributes to the stroke.   Past Medical History:  Diagnosis Date  . Anginal pain ([place]) 06/2017  . Anxiety   . Bronchitis 06/2017  . Chronic chest pain   . Chronic kidney disease (CKD) stage G3b/A1, moderately decreased glomerular filtration rate (GFR) between 30-44 mL/min/1.73 square meter and albuminuria creatinine ratio less than 30 mg/g (HCC)   . Chronic lower back pain    WITH LEG WEAKNESS  . COPD (chronic obstructive pulmonary disease) (HCC)    EMPHYSEMA, O2  AT 2L PRN  . Coronary artery disease    a. 12/2013 PCI: mRCA 100% with L to R collats s/p PCI/DES;  b. 06/2014 MV: no ischemia/infarct, EF 53%;  c. 03/2015 Cath: LM nl, LAD nl, D1 80 (1.1mm), LCX 36m (<1.52mm), OM1 nl, RCA 55p, patent stent, RPDA nl, EF 65%; d. 09/2015 Cath: LM nl, LAD 14m, D1 80 (<23mm), LCX 64m/d (<1.55mm), OM1 nl, RCA 55p (FFR 0.93), patent stent, RPDA nl-->Med Rx.  . Daily headache   . Depression   . Diastolic dysfunction    a. echo 12/2013: EF 55-60%, mild LVH, GR1DD, inf HK, elevated CVP, mildly dilated IVC suggestive of increased RA pressure  . Dyspnea    WHEEZING  . GERD (gastroesophageal reflux disease)    REFLUX  . Hearing loss   . High cholesterol   . Hypertension   . Myocardial infarction ([place]) 2015  . Nicotine addiction    a. using eCigs.  . Orthopnea   . Sleep apnea   . Vertigo     Past Surgical History:  Procedure Laterality Date  . CARDIAC CATHETERIZATION     MC x 1 stent  . CARDIAC CATHETERIZATION Left 03/12/2015   Procedure: Left Heart Cath and Coronary Angiography;  Surgeon: Leonie Man, MD;  Location: [facility] [facility];  Service: Cardiovascular;  Laterality: Left;  . CARDIAC CATHETERIZATION N/A 09/29/2015   Procedure: Left Heart Cath and Coronary Angiography;  Surgeon:  Wellington Hampshire, MD;  Location: [place] [facility];  Service: Cardiovascular;  Laterality: N/A;  . CATARACT EXTRACTION W/PHACO Right 08/10/2017   Procedure: CATARACT EXTRACTION PHACO AND INTRAOCULAR LENS PLACEMENT ([place]) RIGHT;  Surgeon: Leandrew Koyanagi, MD;  Location: [facility];  Service: Ophthalmology;  Laterality: Right;  . CORONARY ANGIOPLASTY     STENT PLACEMENT  . LEFT HEART CATH AND CORONARY ANGIOGRAPHY N/A 09/06/2018   Procedure: LEFT HEART CATH AND CORONARY ANGIOGRAPHY;  Surgeon: Minna Merritts, MD;  Location: [place] [facility];  Service: Cardiovascular;  Laterality: N/A;  . LEFT HEART CATHETERIZATION WITH CORONARY ANGIOGRAM N/A 12/12/2013   Procedure:  LEFT HEART CATHETERIZATION WITH CORONARY ANGIOGRAM;  Surgeon: Wellington Hampshire, MD;  Location: [place] [facility];  Service: Cardiovascular;  Laterality: N/A;    Current Medications: Current Meds  Medication Sig  . acetaminophen (TYLENOL) 500 MG tablet Take 1,000 mg by mouth daily as needed for headache.   . albuterol (PROVENTIL HFA;VENTOLIN HFA) 108 (90 Base) MCG/ACT inhaler Inhale into the lungs every 6 (six) hours as needed for wheezing or shortness of breath.  . ALPRAZolam (XANAX XR) 3 MG 24 hr tablet Take 3 mg by mouth 2 (two) times daily. 1 MG IN THE MORNING AND 3 MG IN THE EVENING  . ALPRAZolam (XANAX) 1 MG tablet Take 1 mg by mouth daily.   Marland Kitchen aspirin 81 MG EC tablet Take 1 tablet (81 mg total) by mouth daily.  Marland Kitchen atorvastatin (LIPITOR) 80 MG tablet Take 1 tablet (80 mg total) by mouth daily.  . clopidogrel (PLAVIX) 75 MG tablet Take 1 tablet (75 mg total) by mouth daily.  Marland Kitchen DALIRESP 500 MCG TABS tablet TAKE 1 TABLET BY MOUTH DAILY. IF NEEDED, TRY TO TAKE 1 TABLET EVERY OTHER DAY  . ezetimibe (ZETIA) 10 MG tablet TAKE 1 TABLET(10 MG) BY MOUTH DAILY  . Fluticasone-Umeclidin-Vilant (TRELEGY ELLIPTA) 100-62.5-25 MCG/INH AEPB Inhale 1 puff into the lungs daily.  . furosemide (LASIX) 20 MG tablet Take 20 mg by mouth daily.   . hydrALAZINE (APRESOLINE) 25 MG tablet Take 25 mg by mouth as needed.  . meclizine (ANTIVERT) 12.5 MG tablet Take 1 tablet (12.5 mg total) by mouth 3 (three) times daily as needed for dizziness.  . metoprolol succinate (TOPROL-XL) 100 MG 24 hr tablet TAKE 1 TABLET(100 MG) BY MOUTH DAILY WITH OR IMMEDIATELY FOLLOWING A MEAL  . nitroGLYCERIN (NITROSTAT) 0.4 MG SL tablet Place 1 tablet (0.4 mg total) under the tongue every 5 (five) minutes as needed for chest pain.  Marland Kitchen omeprazole (PRILOSEC) 40 MG capsule Take 40 mg by mouth 2 (two) times daily.  . OXYGEN Inhale 2 L into the lungs daily as needed (oxygen).      Allergies:   Paroxetine hcl, Serotonin reuptake inhibitors (ssris),  Diazepam, Doxycycline monohydrate, Escitalopram oxalate, Lorazepam, and Tetracyclines & related   Social History   Socioeconomic History  . Marital status: Married    Spouse name: Not on file  . Number of children: Not on file  . Years of education: Not on file  . Highest education level: Not on file  Occupational History  . Not on file  Tobacco Use  . Smoking status: Former Smoker    Packs/day: 3.00    Years: 48.00    Pack years: 144.00    Types: E-cigarettes, Cigarettes    Quit date: 06/17/2009    Years since quitting: 11.0  . Smokeless tobacco: Never Used  . Tobacco comment: quit e-cigs 03/2020  Vaping  Use  . Vaping Use: Former  . Quit date: 04/15/2020  Substance and Sexual Activity  . Alcohol use: Yes    Alcohol/week: 6.0 standard drinks    Types: 6 Cans of beer per week    Comment: Occasional   . Drug use: No  . Sexual activity: Not Currently  Other Topics Concern  . Not on file  Social History Narrative  . Not on file   Social Determinants of Health   Financial Resource Strain: Not on file  Food Insecurity: Not on file  Transportation Needs: Not on file  Physical Activity: Not on file  Stress: Not on file  Social Connections: Not on file     Family History: The patient's family history includes CVA in his mother; Coronary artery disease in his brother and father; Heart disease in his father; Heart disease (age of onset: 83) in his brother. There is no history of Kidney disease or Prostate cancer.  ROS:   Please see the history of present illness.    All other systems reviewed and are negative.  EKGs/Labs/Other Studies Reviewed:    The following studies were reviewed today:   May 08, 2020 echo personally reviewed Left ventricular function normal, 55% Right ventricular function normal No significant valvular abnormalities  May 15, 2020 ZIO personally reviewed No A. fib Rare supraventricular and ventricular ectopy 8 episodes of SVT, longest  lasting 13 beats with an average rate of 110 bpm No junctional rhythm..  EKG:  The ekg ordered today demonstrates sinus rhythm  Recent Labs: 08/13/2019: TSH 1.928 05/07/2020: ALT 16 05/08/2020: BUN 15; Creatinine, Ser 1.75; Hemoglobin 12.6; Platelets 212; Potassium 3.7; Sodium 138  Recent Lipid Panel    Component Value Date/Time   CHOL 98 05/08/2020 0435   CHOL 114 07/23/2019 1155   TRIG 79 05/08/2020 0435   HDL 31 (L) 05/08/2020 0435   HDL 38 (L) 07/23/2019 1155   CHOLHDL 3.2 05/08/2020 0435   VLDL 16 05/08/2020 0435   LDLCALC 51 05/08/2020 0435   LDLCALC 53 07/23/2019 1155    Physical Exam:    VS:  BP (!) 160/90 (BP Location: Left Arm, Patient Position: Sitting, Cuff Size: Normal)   Pulse 75   Ht 5\' 9"  (1.753 m)   Wt 184 lb 6 oz (83.6 kg)   SpO2 96%   BMI 27.23 kg/m     Wt Readings from Last 3 Encounters:  07/16/20 184 lb 6 oz (83.6 kg)  07/01/20 178 lb (80.7 kg)  06/16/20 182 lb 2 oz (82.6 kg)     GEN:  Well nourished, well developed in no acute distress.  Chronically ill-appearing.  On oxygen supplementation HEENT: Normal NECK: No JVD; No carotid bruits LYMPHATICS: No lymphadenopathy CARDIAC: RRR, no murmurs, rubs, gallops RESPIRATORY:  Clear to auscultation without rales, wheezing or rhonchi  ABDOMEN: Soft, non-tender, non-distended MUSCULOSKELETAL:  No edema; No deformity  SKIN: Warm and dry NEUROLOGIC:  Alert and oriented x 3 PSYCHIATRIC:  Normal affect   ASSESSMENT:    1. Cryptogenic stroke (HCC)   2. Paroxysmal SVT (supraventricular tachycardia) (HCC)    PLAN:    In order of problems listed above:  1. Cryptogenic stroke Patient with a history of cryptogenic stroke.  Monitoring thus far has not shown atrial fibrillation.  In an effort to maximize the chances of detecting atrial fibrillation, we will plan for a loop recorder implant.  I discussed the procedure, risks and expected recovery time with the patient and his family who is with  him today and  they wish to proceed.   Medication Adjustments/Labs and Tests Ordered: Current medicines are reviewed at length with the patient today.  Concerns regarding medicines are outlined above.  No orders of the defined types were placed in this encounter.  No orders of the defined types were placed in this encounter.    Signed, Lars Mage, MD, [facility]  07/16/2020 2:13 PM    Electrophysiology [facility]    SURGEON:  Lars Mage, MD    PREPROCEDURE DIAGNOSIS: Cryptogenic stroke    POSTPROCEDURE DIAGNOSIS: Cryptogenic stroke     PROCEDURES:   1. Implantable loop recorder implantation    INTRODUCTION: Mr. Berthelot is a 70 y.o. patient with cryptogenic stroke who presents today for implantable loop implantation.      DESCRIPTION OF PROCEDURE:  Informed written consent was obtained.  The patient required no sedation for the procedure today.   The patients left chest was therefore prepped and draped in the usual sterile fashion. The skin overlying the left parasternal region was infiltrated with lidocaine for local analgesia.  A 0.5-cm incision was made over the left parasternal region over the 3rd intercostal space.  A Medtronic Reveal Linq model [id number]) implantable loop recorder was then placed into the pocket  R waves were very prominent and measured >0.16mV.  Steri- Strips and a sterile dressing were then applied.  There were no early apparent complications.     CONCLUSIONS:   1. Successful implantation of a Medtronic Reveal LINQ implantable loop recorder for cryptogenic stroke.  2. No early apparent complications.   Lysbeth Galas T. Quentin Ore, MD, [facility] Cardiac Electrophysiology

## 2020-07-17 ENCOUNTER — Telehealth: Payer: Self-pay | Admitting: Physician Assistant

## 2020-07-17 ENCOUNTER — Other Ambulatory Visit: Payer: Self-pay | Admitting: *Deleted

## 2020-07-17 DIAGNOSIS — E785 Hyperlipidemia, unspecified: Secondary | ICD-10-CM

## 2020-07-17 MED ORDER — EZETIMIBE 10 MG PO TABS: ORAL_TABLET | ORAL | 1 refills | Status: DC

## 2020-07-17 NOTE — Telephone Encounter (Signed)
The patient was seen in the office yesterday and a refill for his zetia was sent to the [facility] in [place]. At discharge yesterday, the patient's wife advised this needed to be sent to the [facility] in [place] instead.  I was unable to call the pharmacy yesterday due to being in clinic. I have called [facility] this afternoon and left a message to please disregard the Zetia RX that was sent in by our office yesterday as this was being sent to an alternate pharmacy per the patient's request.   I have resubmitted this RX to [facility] in [place].

## 2020-07-25 ENCOUNTER — Telehealth: Payer: Self-pay | Admitting: Pulmonary Disease

## 2020-07-25 MED ORDER — DALIRESP 500 MCG PO TABS
500.0000 ug | ORAL_TABLET | ORAL | 1 refills | Status: DC
Start: 2020-07-25 — End: 2021-01-27

## 2020-07-25 NOTE — Telephone Encounter (Signed)
Rx for Daliresp 534mcg gas been sent to preferred pharmacy.  Patient's spouse, Gloria(DPR) is aware and voiced her understanding.  Nothing further needed.

## 2020-07-28 NOTE — Progress Notes (Unsigned)
Date:  07/29/2020   ID:  William Mueller, DOB 1951-02-12, MRN [id number]  Patient Location:  [place] [address]   Provider location:   [facility], [place] office  PCP:  Philmore Pali, NP  Cardiologist:  Patsy Baltimore  Chief Complaint  Patient presents with  . Follow-up    1 Month follow up. Medications verbally reviewed with patient and caregiver.    History of Present Illness:    William Mueller is a 69 y.o. male  past medical history of long history of smoking, COPD,  chronic renal insufficiency, CR 1.7 CAD  12/11/2013 to [place] with chest pain cardiac catheterization lab : occluded mid RCA. Stent was placed.  Repeat catheterization in October 2016 with patent stent,disease of a small diagonal and distal circumflex not amenable to intervention.  paroxysmal atrial tachycardia Cardiac catheterization April 2017 He presents today for follow-up of his coronary artery disease, stable angina and COPD   LOV 01/2020 with myself Seen by EP 07/16/2020 May 07, 2020 concern for a stroke. Seen in the emergency department where imaging confirmed an acute infarct in his inferior left frontal lobe.  recommended to continue aspirin and Plavix indefinitely.   ZIO monitor performed after the event showed no episodes of atrial fibrillation  loop recorder implant. 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  Ost 1st Diag to 1st Diag lesion is 80% stenosed.  Previously placed Prox RCA to Mid RCA stent (unknown type) is widely patent.  Prox RCA lesion is 35% stenosed.  The left ventricular ejection fraction is 55-65% by visual estimate.  The left ventricular systolic function is normal.  LV end diastolic pressure is normal.  There is no mitral valve regurgitation.  Prox Cx lesion is 30% stenosed.    : Medical management start Ranexa 500 mg twice daily for 1 week then up to 1000 mg twice daily It was felt his chest pain was secondary to small vessel disease   Lab work reviewed   total cholesterol 125 LDL 59 creatinine 1.73 normal electrolytes and LFTs   Prior CV studies:   The following studies were reviewed today:    Past Medical History:  Diagnosis Date  . Anginal pain ([place]) 06/2017  . Anxiety   . Bronchitis 06/2017  . Chronic chest pain   . Chronic kidney disease (CKD) stage G3b/A1, moderately decreased glomerular filtration rate (GFR) between 30-44 mL/min/1.73 square meter and albuminuria creatinine ratio less than 30 mg/g (HCC)   . Chronic lower back pain    WITH LEG WEAKNESS  . COPD (chronic obstructive pulmonary disease) (HCC)    EMPHYSEMA, O2 AT 2L PRN  . Coronary artery disease    a. 12/2013  PCI: mRCA 100% with L to R collats s/p PCI/DES;  b. 06/2014 MV: no ischemia/infarct, EF 53%;  c. 03/2015 Cath: LM nl, LAD nl, D1 80 (1.41mm), LCX 80m (<1.110mm), OM1 nl, RCA 55p, patent stent, RPDA nl, EF 65%; d. 09/2015 Cath: LM nl, LAD 49m, D1 80 (<10mm), LCX 31m/d (<1.7mm), OM1 nl, RCA 55p (FFR 0.93), patent stent, RPDA nl-->Med Rx.  . Daily headache   . Depression   . Diastolic dysfunction    a. echo 12/2013: EF 55-60%, mild LVH, GR1DD, inf HK, elevated CVP, mildly dilated IVC  suggestive of increased RA pressure  . Dyspnea    WHEEZING  . GERD (gastroesophageal reflux disease)    REFLUX  . Hearing loss   . High cholesterol   . Hypertension   . Myocardial infarction ([place]) 2015  . Nicotine addiction    a. using eCigs.  . Orthopnea   . Sleep apnea   . Vertigo    Past Surgical History:  Procedure Laterality Date  . CARDIAC CATHETERIZATION     MC x 1 stent  . CARDIAC CATHETERIZATION Left 03/12/2015   Procedure: Left Heart Cath and Coronary Angiography;  Surgeon: Leonie Man, MD;  Location: [facility];  Service: Cardiovascular;  Laterality: Left;  . CARDIAC CATHETERIZATION N/A 09/29/2015   Procedure: Left Heart Cath and Coronary Angiography;  Surgeon: Wellington Hampshire, MD;  Location: [facility];  Service: Cardiovascular;  Laterality: N/A;  . CATARACT EXTRACTION W/PHACO Right 08/10/2017   Procedure: CATARACT EXTRACTION PHACO AND INTRAOCULAR LENS PLACEMENT ([place]) RIGHT;  Surgeon: Leandrew Koyanagi, MD;  Location: [facility];  Service: Ophthalmology;  Laterality: Right;  . CORONARY ANGIOPLASTY     STENT PLACEMENT  . LEFT HEART CATH AND CORONARY ANGIOGRAPHY N/A 09/06/2018   Procedure: LEFT HEART CATH AND CORONARY ANGIOGRAPHY;  Surgeon: Minna Merritts, MD;  Location: [facility];  Service: Cardiovascular;  Laterality: N/A;  . LEFT HEART CATHETERIZATION WITH CORONARY ANGIOGRAM N/A 12/12/2013   Procedure: LEFT HEART CATHETERIZATION WITH CORONARY ANGIOGRAM;  Surgeon: Wellington Hampshire, MD;  Location: [facility];  Service: Cardiovascular;  Laterality: N/A;     Current Meds  Medication Sig  . acetaminophen (TYLENOL) 500 MG tablet Take 1,000 mg by mouth daily as needed for headache.   . albuterol (PROVENTIL HFA;VENTOLIN HFA) 108 (90 Base) MCG/ACT inhaler Inhale into the lungs every 6 (six) hours as needed for wheezing or shortness of breath.  . ALPRAZolam (XANAX XR) 3 MG 24 hr tablet Take 3 mg by mouth 2 (two) times daily. 1 MG IN  THE MORNING AND 3 MG IN THE EVENING  . ALPRAZolam (XANAX) 1 MG tablet Take 1 mg by mouth daily.   Marland Kitchen aspirin 81 MG EC tablet Take 1 tablet (81 mg total) by mouth daily.  Marland Kitchen atorvastatin (LIPITOR) 80 MG tablet Take 1 tablet (80 mg total) by mouth daily.  . clopidogrel (PLAVIX) 75 MG tablet Take 1 tablet (75 mg total) by mouth daily.  Marland Kitchen ezetimibe (ZETIA) 10 MG tablet TAKE 1 TABLET(10 MG) BY MOUTH DAILY  . Fluticasone-Umeclidin-Vilant (TRELEGY ELLIPTA) 100-62.5-25 MCG/INH AEPB Inhale 1 puff into the lungs daily.  . furosemide (LASIX) 20 MG tablet Take 20 mg by mouth daily.   . hydrALAZINE (APRESOLINE) 25 MG tablet Take 25 mg by mouth as needed.  . meclizine (ANTIVERT) 12.5 MG tablet Take 1 tablet (12.5 mg total) by mouth 3 (three) times daily as needed for dizziness.  . metoprolol succinate (TOPROL-XL)  100 MG 24 hr tablet TAKE 1 TABLET(100 MG) BY MOUTH DAILY WITH OR IMMEDIATELY FOLLOWING A MEAL  . nitroGLYCERIN (NITROSTAT) 0.4 MG SL tablet Place 1 tablet (0.4 mg total) under the tongue every 5 (five) minutes as needed for chest pain.  Marland Kitchen omeprazole (PRILOSEC) 40 MG capsule Take 40 mg by mouth 2 (two) times daily.  . OXYGEN Inhale 2 L into the lungs daily as needed (oxygen).   . roflumilast (DALIRESP) 500 MCG TABS tablet Take 1 tablet (500 mcg total) by mouth every other day.     Allergies:   Paroxetine hcl, Serotonin reuptake inhibitors (ssris), Diazepam, Doxycycline monohydrate, Escitalopram oxalate, Lorazepam, and Tetracyclines & related   Social History   Tobacco Use  . Smoking status: Former Smoker    Packs/day: 3.00    Years: 48.00    Pack years: 144.00    Types: E-cigarettes, Cigarettes    Quit date: 06/17/2009    Years since quitting: 11.1  . Smokeless tobacco: Never Used  . Tobacco comment: quit e-cigs 03/2020  Vaping Use  . Vaping Use: Former  . Quit date: 04/15/2020  Substance Use Topics  . Alcohol use: Yes    Alcohol/week: 6.0 standard drinks    Types: 6 Cans of beer per week     Comment: Occasional   . Drug use: No     Family Hx: The patient's family history includes CVA in his mother; Coronary artery disease in his brother and father; Heart disease in his father; Heart disease (age of onset: 62) in his brother. There is no history of Kidney disease or Prostate cancer.  ROS:   Please see the history of present illness.    Review of Systems  Constitutional: Negative.   Respiratory: Positive for shortness of breath.   Cardiovascular: Negative.   Gastrointestinal: Negative.   Musculoskeletal: Negative.   Neurological: Negative.   Psychiatric/Behavioral: Negative.   All other systems reviewed and are negative.     Labs/Other Tests and Data Reviewed:    Recent Labs: 08/13/2019: TSH 1.928 05/07/2020: ALT 16 05/08/2020: BUN 15; Creatinine, Ser 1.75; Hemoglobin 12.6; Platelets 212; Potassium 3.7; Sodium 138   Recent Lipid Panel Lab Results  Component Value Date/Time   CHOL 98 05/08/2020 04:35 AM   CHOL 114 07/23/2019 11:55 AM   TRIG 79 05/08/2020 04:35 AM   HDL 31 (L) 05/08/2020 04:35 AM   HDL 38 (L) 07/23/2019 11:55 AM   CHOLHDL 3.2 05/08/2020 04:35 AM   LDLCALC 51 05/08/2020 04:35 AM   LDLCALC 53 07/23/2019 11:55 AM    Wt Readings from Last 3 Encounters:  07/29/20 185 lb (83.9 kg)  07/16/20 184 lb 6 oz (83.6 kg)  07/01/20 178 lb (80.7 kg)     Exam:    BP (!) 154/82 (BP Location: Right Arm, Patient Position: Sitting, Cuff Size: Normal)   Pulse 74   Ht 5\' 9"  (1.753 m)   Wt 185 lb (83.9 kg)   SpO2 96% Comment: 2 Liters of Oxygen  BMI 27.32 kg/m  Constitutional:  oriented to person, place, and time. No distress.  On oxygen, presenting in a wheelchair HENT:  Head: Grossly normal Eyes:  no discharge. No scleral icterus.  Neck: No JVD, no carotid bruits  Cardiovascular: Regular rate and rhythm, no murmurs appreciated Pulmonary/Chest: Clear to auscultation bilaterally, no wheezes or rails Abdominal: Soft.  no distension.  no tenderness.   Musculoskeletal: Normal range of motion Neurological:  normal muscle tone. Coordination normal. No atrophy Skin: Skin warm and  dry Psychiatric: normal affect, pleasant  ASSESSMENT & PLAN:    Atherosclerosis of native coronary artery of native heart with unstable angina pectoris ([place]) Prior catheterization last year, medical management recommended Denies anginal symptoms Cholesterol at goal, non-smoker  Atrial tachycardia, paroxysmal (HCC) Denies any tachypalpitations  Stroke Last month documented on CT scan On aspirin Plavix, cholesterol at goal Has Loop monitor in place  CKD (chronic kidney disease) stage 3, GFR 30-59 ml/min (HCC) Chronic kidney disease creatinine 1.75, stable stable  Centrilobular emphysema (HCC) On chronic oxygen, Stopped smoking Reports breathing is stable, active in his workshop      Total encounter time more than 25 minutes  Greater than 50% was spent in counseling and coordination of care with the patient    Signed, Ida Rogue, MD  07/29/2020 4:42 PM    [place] Office [address], [place], [place] [address]

## 2020-07-29 ENCOUNTER — Encounter: Payer: Self-pay | Admitting: Cardiovascular Disease

## 2020-07-29 ENCOUNTER — Other Ambulatory Visit: Payer: Self-pay

## 2020-07-29 ENCOUNTER — Ambulatory Visit (INDEPENDENT_AMBULATORY_CARE_PROVIDER_SITE_OTHER): Payer: HMO | Admitting: Cardiovascular Disease

## 2020-07-29 VITALS — BP 154/82 | HR 74 | Ht 69.0 in | Wt 185.0 lb

## 2020-07-29 DIAGNOSIS — I2510 Atherosclerotic heart disease of native coronary artery without angina pectoris: Secondary | ICD-10-CM | POA: Diagnosis not present

## 2020-07-29 DIAGNOSIS — I10 Essential (primary) hypertension: Secondary | ICD-10-CM

## 2020-07-29 DIAGNOSIS — Z87891 Personal history of nicotine dependence: Secondary | ICD-10-CM

## 2020-07-29 DIAGNOSIS — I471 Supraventricular tachycardia: Secondary | ICD-10-CM

## 2020-07-29 DIAGNOSIS — N183 Chronic kidney disease, stage 3 unspecified: Secondary | ICD-10-CM

## 2020-07-29 DIAGNOSIS — Z9861 Coronary angioplasty status: Secondary | ICD-10-CM

## 2020-07-29 DIAGNOSIS — J449 Chronic obstructive pulmonary disease, unspecified: Secondary | ICD-10-CM

## 2020-07-29 DIAGNOSIS — E785 Hyperlipidemia, unspecified: Secondary | ICD-10-CM

## 2020-07-29 DIAGNOSIS — I639 Cerebral infarction, unspecified: Secondary | ICD-10-CM | POA: Diagnosis not present

## 2020-07-29 DIAGNOSIS — I5032 Chronic diastolic (congestive) heart failure: Secondary | ICD-10-CM

## 2020-07-29 NOTE — Patient Instructions (Signed)
Medication Instructions:  No changes  If you need a refill on your cardiac medications before your next appointment, please call your pharmacy.    Lab work: No new labs needed   If you have labs (blood work) drawn today and your tests are completely normal, you will receive your results only by: . MyChart Message (if you have MyChart) OR . A paper copy in the mail If you have any lab test that is abnormal or we need to change your treatment, we will call you to review the results.   Testing/Procedures: No new testing needed   Follow-Up: At [facility], you and your health needs are our priority.  As part of our continuing mission to provide you with exceptional heart care, we have created designated Provider Care Teams.  These Care Teams include your primary Cardiologist (physician) and Advanced Practice Providers (APPs -  Physician Assistants and Nurse Practitioners) who all work together to provide you with the care you need, when you need it.  . You will need a follow up appointment in 6 months  . Providers on your designated Care Team:   . Christopher Berge, NP . Ryan Dunn, PA-C . Jacquelyn Visser, PA-C  Any Other Special Instructions Will Be Listed Below (If Applicable).  COVID-19 Vaccine Information can be found at: [email or web address] For questions related to vaccine distribution or appointments, please email [email or web address] or call [phone].     

## 2020-08-04 ENCOUNTER — Telehealth: Payer: Self-pay | Admitting: Cardiovascular Disease

## 2020-08-04 MED ORDER — CLOPIDOGREL BISULFATE 75 MG PO TABS: 75.0000 mg | ORAL_TABLET | Freq: Every day | ORAL | 1 refills | Status: DC

## 2020-08-04 NOTE — Telephone Encounter (Signed)
*  STAT* If patient is at the pharmacy, call can be transferred to refill team.   1. Which medications need to be refilled? (please list name of each medication and dose if known)   Plavix 75 mg po q d   2. Which pharmacy/location (including street and city if local pharmacy) is medication to be sent to?   [place]   3. Do they need a 30 day or 90 day supply? [place]

## 2020-08-04 NOTE — Telephone Encounter (Signed)
Requested Prescriptions   Signed Prescriptions Disp Refills   clopidogrel (PLAVIX) 75 MG tablet 90 tablet 1    Sig: Take 1 tablet (75 mg total) by mouth daily.    Authorizing Provider: Minna Merritts    Ordering User: Raelene Bott, Truitt Cruey L

## 2020-08-05 DIAGNOSIS — H353211 Exudative age-related macular degeneration, right eye, with active choroidal neovascularization: Secondary | ICD-10-CM | POA: Diagnosis not present

## 2020-08-07 DIAGNOSIS — E785 Hyperlipidemia, unspecified: Secondary | ICD-10-CM | POA: Diagnosis not present

## 2020-08-07 DIAGNOSIS — Z0000 Encounter for general adult medical examination without abnormal findings: Secondary | ICD-10-CM | POA: Diagnosis not present

## 2020-08-07 DIAGNOSIS — Z9181 History of falling: Secondary | ICD-10-CM | POA: Diagnosis not present

## 2020-08-07 DIAGNOSIS — Z1331 Encounter for screening for depression: Secondary | ICD-10-CM | POA: Diagnosis not present

## 2020-08-07 DIAGNOSIS — Z139 Encounter for screening, unspecified: Secondary | ICD-10-CM | POA: Diagnosis not present

## 2020-08-18 ENCOUNTER — Other Ambulatory Visit: Payer: Self-pay | Admitting: Cardiovascular Disease

## 2020-08-18 DIAGNOSIS — I471 Supraventricular tachycardia: Secondary | ICD-10-CM

## 2020-08-18 DIAGNOSIS — I25118 Atherosclerotic heart disease of native coronary artery with other forms of angina pectoris: Secondary | ICD-10-CM

## 2020-08-18 DIAGNOSIS — I10 Essential (primary) hypertension: Secondary | ICD-10-CM

## 2020-08-18 DIAGNOSIS — E785 Hyperlipidemia, unspecified: Secondary | ICD-10-CM

## 2020-08-18 MED ORDER — METOPROLOL SUCCINATE ER 100 MG PO TB24: ORAL_TABLET | ORAL | 2 refills | Status: DC

## 2020-08-18 MED ORDER — ATORVASTATIN CALCIUM 80 MG PO TABS: 80.0000 mg | ORAL_TABLET | Freq: Every day | ORAL | 2 refills | Status: DC

## 2020-08-18 NOTE — Telephone Encounter (Signed)
Refill sent in

## 2020-08-18 NOTE — Telephone Encounter (Signed)
Atorvastatin is a tier 1 medication on his [place]. Not sure why this message was received about it not being covered, it should be free on his plan.

## 2020-08-18 NOTE — Telephone Encounter (Signed)
°*  STAT* If patient is at the pharmacy, call can be transferred to refill team.   1. Which medications need to be refilled? (please list name of each medication and dose if known)  Atorvastatin 80 MG 1 tablet daily Metoprolol 100 MG 1 tablet daily   2. Which pharmacy/location (including street and city if local pharmacy) is medication to be sent to? [facility] in [place]   3. Do they need a 30 day or 90 day supply? 90 day

## 2020-08-19 ENCOUNTER — Ambulatory Visit (INDEPENDENT_AMBULATORY_CARE_PROVIDER_SITE_OTHER): Payer: HMO

## 2020-08-19 DIAGNOSIS — I639 Cerebral infarction, unspecified: Secondary | ICD-10-CM

## 2020-08-19 DIAGNOSIS — K219 Gastro-esophageal reflux disease without esophagitis: Secondary | ICD-10-CM | POA: Diagnosis not present

## 2020-08-19 DIAGNOSIS — Z86010 Personal history of colonic polyps: Secondary | ICD-10-CM | POA: Diagnosis not present

## 2020-08-19 DIAGNOSIS — J449 Chronic obstructive pulmonary disease, unspecified: Secondary | ICD-10-CM | POA: Diagnosis not present

## 2020-08-19 DIAGNOSIS — Z8673 Personal history of transient ischemic attack (TIA), and cerebral infarction without residual deficits: Secondary | ICD-10-CM | POA: Diagnosis not present

## 2020-08-19 LAB — CUP PACEART REMOTE DEVICE CHECK
Date Time Interrogation Session: 20220314175414
Implantable Pulse Generator Implant Date: 20220209

## 2020-08-22 DIAGNOSIS — H2512 Age-related nuclear cataract, left eye: Secondary | ICD-10-CM | POA: Diagnosis not present

## 2020-08-25 ENCOUNTER — Telehealth: Payer: Self-pay | Admitting: Pulmonary Disease

## 2020-08-25 DIAGNOSIS — J449 Chronic obstructive pulmonary disease, unspecified: Secondary | ICD-10-CM

## 2020-08-25 NOTE — Telephone Encounter (Signed)
Called and spoke to patient, who is requesting to switch DME company to [employer] due to insurance not being in network with [employer] Order for 2L cont has been placed to [facility] per patient request.  Nothing further needed at this time.

## 2020-08-26 DIAGNOSIS — F419 Anxiety disorder, unspecified: Secondary | ICD-10-CM | POA: Diagnosis not present

## 2020-08-26 DIAGNOSIS — F322 Major depressive disorder, single episode, severe without psychotic features: Secondary | ICD-10-CM | POA: Diagnosis not present

## 2020-08-26 DIAGNOSIS — Z79899 Other long term (current) drug therapy: Secondary | ICD-10-CM | POA: Diagnosis not present

## 2020-08-26 DIAGNOSIS — J449 Chronic obstructive pulmonary disease, unspecified: Secondary | ICD-10-CM | POA: Diagnosis not present

## 2020-08-26 DIAGNOSIS — Z6827 Body mass index (BMI) 27.0-27.9, adult: Secondary | ICD-10-CM | POA: Diagnosis not present

## 2020-08-27 ENCOUNTER — Ambulatory Visit: Payer: Self-pay | Admitting: Urology

## 2020-08-27 NOTE — Progress Notes (Signed)
Carelink Summary Report / Loop Recorder 

## 2020-08-29 ENCOUNTER — Telehealth: Payer: Self-pay | Admitting: Pulmonary Disease

## 2020-08-29 NOTE — Telephone Encounter (Signed)
Spoke to patient, who stated that [facility] is needing an order for POC.  Order was placed to [facility] on 08/25/2020 for 2L cont. Order states to eval patient for best fit POC.   Lm for Melissa with [employer].

## 2020-09-01 NOTE — Telephone Encounter (Signed)
Spoke to [place] with [employer], who stated that she will send a message to her team for best fit POC. New order for POC is not needed.  Patient is aware and voiced his understanding.  Nothing further needed at this time.

## 2020-09-14 ENCOUNTER — Other Ambulatory Visit: Payer: Self-pay | Admitting: Family

## 2020-09-14 DIAGNOSIS — E785 Hyperlipidemia, unspecified: Secondary | ICD-10-CM

## 2020-09-15 DIAGNOSIS — H34832 Tributary (branch) retinal vein occlusion, left eye, with macular edema: Secondary | ICD-10-CM | POA: Diagnosis not present

## 2020-09-15 DIAGNOSIS — H353211 Exudative age-related macular degeneration, right eye, with active choroidal neovascularization: Secondary | ICD-10-CM | POA: Diagnosis not present

## 2020-09-15 NOTE — Telephone Encounter (Signed)
Rx request sent to pharmacy.  

## 2020-09-19 ENCOUNTER — Ambulatory Visit (INDEPENDENT_AMBULATORY_CARE_PROVIDER_SITE_OTHER): Payer: HMO

## 2020-09-19 DIAGNOSIS — G459 Transient cerebral ischemic attack, unspecified: Secondary | ICD-10-CM | POA: Diagnosis not present

## 2020-09-22 LAB — CUP PACEART REMOTE DEVICE CHECK
Date Time Interrogation Session: 20220416175317
Implantable Pulse Generator Implant Date: 20220209

## 2020-09-29 DIAGNOSIS — H2512 Age-related nuclear cataract, left eye: Secondary | ICD-10-CM | POA: Diagnosis not present

## 2020-10-06 ENCOUNTER — Other Ambulatory Visit: Payer: Self-pay

## 2020-10-06 ENCOUNTER — Emergency Department
Admission: EM | Admit: 2020-10-06 | Discharge: 2020-10-06 | Disposition: A | Discharge status: Home / Self Care | Payer: HMO | Attending: Emergency Medicine | Admitting: Emergency Medicine

## 2020-10-06 ENCOUNTER — Ambulatory Visit: Payer: HMO | Admitting: Pulmonary Disease

## 2020-10-06 ENCOUNTER — Encounter: Payer: Self-pay | Admitting: Emergency Medicine

## 2020-10-06 DIAGNOSIS — Z87891 Personal history of nicotine dependence: Secondary | ICD-10-CM | POA: Diagnosis not present

## 2020-10-06 DIAGNOSIS — Z955 Presence of coronary angioplasty implant and graft: Secondary | ICD-10-CM | POA: Insufficient documentation

## 2020-10-06 DIAGNOSIS — N183 Chronic kidney disease, stage 3 unspecified: Secondary | ICD-10-CM | POA: Diagnosis not present

## 2020-10-06 DIAGNOSIS — I2510 Atherosclerotic heart disease of native coronary artery without angina pectoris: Secondary | ICD-10-CM | POA: Insufficient documentation

## 2020-10-06 DIAGNOSIS — J449 Chronic obstructive pulmonary disease, unspecified: Secondary | ICD-10-CM | POA: Insufficient documentation

## 2020-10-06 DIAGNOSIS — Z79899 Other long term (current) drug therapy: Secondary | ICD-10-CM | POA: Diagnosis not present

## 2020-10-06 DIAGNOSIS — W228XXA Striking against or struck by other objects, initial encounter: Secondary | ICD-10-CM | POA: Insufficient documentation

## 2020-10-06 DIAGNOSIS — I129 Hypertensive chronic kidney disease with stage 1 through stage 4 chronic kidney disease, or unspecified chronic kidney disease: Secondary | ICD-10-CM | POA: Insufficient documentation

## 2020-10-06 DIAGNOSIS — S41112A Laceration without foreign body of left upper arm, initial encounter: Secondary | ICD-10-CM

## 2020-10-06 DIAGNOSIS — Z7982 Long term (current) use of aspirin: Secondary | ICD-10-CM | POA: Diagnosis not present

## 2020-10-06 DIAGNOSIS — S59912A Unspecified injury of left forearm, initial encounter: Secondary | ICD-10-CM | POA: Diagnosis present

## 2020-10-06 DIAGNOSIS — S50812A Abrasion of left forearm, initial encounter: Secondary | ICD-10-CM | POA: Diagnosis not present

## 2020-10-06 NOTE — ED Provider Notes (Signed)
[facility] Emergency Department Provider Note ____________________________________________   Event Date/Time   First MD Initiated Contact with Patient 10/06/20 1300     (approximate)  I have reviewed the triage vital signs and the nursing notes.   HISTORY  Chief Complaint Bleeding L arm    HPI William Mueller is a 70 y.o. male with PMH as noted below including CAD on Plavix who presents with a wound to the left forearm which has been bleeding since 11 AM.  The patient states that he had a scab there from an injury a few weeks ago.  This morning he hit it against a door and it began bleeding again.  It has not subsided despite applying intermittent pressure.  He denies any other abnormal bleeding or bruising.  He denies using any other anticoagulants.  Past Medical History:  Diagnosis Date  . Anginal pain ([place]) 06/2017  . Anxiety   . Bronchitis 06/2017  . Chronic chest pain   . Chronic kidney disease (CKD) stage G3b/A1, moderately decreased glomerular filtration rate (GFR) between 30-44 mL/min/1.73 square meter and albuminuria creatinine ratio less than 30 mg/g (HCC)   . Chronic lower back pain    WITH LEG WEAKNESS  . COPD (chronic obstructive pulmonary disease) (HCC)    EMPHYSEMA, O2 AT 2L PRN  . Coronary artery disease    a. 12/2013 PCI: mRCA 100% with L to R collats s/p PCI/DES;  b. 06/2014 MV: no ischemia/infarct, EF 53%;  c. 03/2015 Cath: LM nl, LAD nl, D1 80 (1.105mm), LCX 55m (<1.10mm), OM1 nl, RCA 55p, patent stent, RPDA nl, EF 65%; d. 09/2015 Cath: LM nl, LAD 15m, D1 80 (<14mm), LCX 14m/d (<1.69mm), OM1 nl, RCA 55p (FFR 0.93), patent stent, RPDA nl-->Med Rx.  . Daily headache   . Depression   . Diastolic dysfunction    a. echo 12/2013: EF 55-60%, mild LVH, GR1DD, inf HK, elevated CVP, mildly dilated IVC suggestive of increased RA pressure  . Dyspnea    WHEEZING  . GERD (gastroesophageal reflux disease)    REFLUX  . Hearing loss   . High cholesterol    . Hypertension   . Myocardial infarction ([place]) 2015  . Nicotine addiction    a. using eCigs.  . Orthopnea   . Sleep apnea   . Vertigo     Patient Active Problem List   Diagnosis Date Noted  . TIA (transient ischemic attack) 05/07/2020  . Prolonged QT interval 05/07/2020  . Chest pain 09/30/2017  . Hypokalemia 09/29/2017  . Near syncope 09/29/2017  . Orthostatic hypotension 09/29/2017  . Nicotine addiction   . CAD S/P PCI DES to RCA 03/11/2015  . Diastolic dysfunction   . Chronic chest pain   . Chest pain at rest 11/23/2014  . GERD (gastroesophageal reflux disease) 06/08/2014  . CKD (chronic kidney disease) stage 3, GFR 30-59 ml/min (HCC) 05/04/2014  . Bilateral leg pain 05/04/2014  . Lung nodule < 6cm on CT 05/04/2014  . Abnormal CT scan, kidney 05/04/2014  . Unstable angina ([place]) 05/04/2014  . Atrial tachycardia, paroxysmal ([place]) 12/14/2013  . Atherosclerosis of native coronary artery with unstable angina pectoris ([place]) 12/13/2013  . Labile essential hypertension 12/11/2013  . Acute renal failure superimposed on stage 3 chronic kidney disease ([place]) 12/11/2013  . Hyperlipidemia 12/11/2013  . COPD (chronic obstructive pulmonary disease) ([place]) 12/11/2013    Past Surgical History:  Procedure Laterality Date  . CARDIAC CATHETERIZATION     MC x 1 stent  .  CARDIAC CATHETERIZATION Left 03/12/2015   Procedure: Left Heart Cath and Coronary Angiography;  Surgeon: Leonie Man, MD;  Location: [place] [facility];  Service: Cardiovascular;  Laterality: Left;  . CARDIAC CATHETERIZATION N/A 09/29/2015   Procedure: Left Heart Cath and Coronary Angiography;  Surgeon: Wellington Hampshire, MD;  Location: [place] CV [facility];  Service: Cardiovascular;  Laterality: N/A;  . CATARACT EXTRACTION W/PHACO Right 08/10/2017   Procedure: CATARACT EXTRACTION PHACO AND INTRAOCULAR LENS PLACEMENT ([place]) RIGHT;  Surgeon: Leandrew Koyanagi, MD;  Location: [place];  Service: Ophthalmology;   Laterality: Right;  . CORONARY ANGIOPLASTY     STENT PLACEMENT  . LEFT HEART CATH AND CORONARY ANGIOGRAPHY N/A 09/06/2018   Procedure: LEFT HEART CATH AND CORONARY ANGIOGRAPHY;  Surgeon: Minna Merritts, MD;  Location: [place] [facility];  Service: Cardiovascular;  Laterality: N/A;  . LEFT HEART CATHETERIZATION WITH CORONARY ANGIOGRAM N/A 12/12/2013   Procedure: LEFT HEART CATHETERIZATION WITH CORONARY ANGIOGRAM;  Surgeon: Wellington Hampshire, MD;  Location: [place] CATH LAB;  Service: Cardiovascular;  Laterality: N/A;    Prior to Admission medications   Medication Sig Start Date End Date Taking? Authorizing Provider  acetaminophen (TYLENOL) 500 MG tablet Take 1,000 mg by mouth daily as needed for headache.     [provider]  albuterol (PROVENTIL HFA;VENTOLIN HFA) 108 (90 Base) MCG/ACT inhaler Inhale into the lungs every 6 (six) hours as needed for wheezing or shortness of breath.    [provider]  ALPRAZolam (XANAX XR) 3 MG 24 hr tablet Take 3 mg by mouth 2 (two) times daily. 1 MG IN THE MORNING AND 3 MG IN THE EVENING    [provider]  ALPRAZolam (XANAX) 1 MG tablet Take 1 mg by mouth daily.  02/15/18   [provider]  aspirin 81 MG EC tablet Take 1 tablet (81 mg total) by mouth daily. 09/04/15   Minna Merritts, MD  atorvastatin (LIPITOR) 80 MG tablet TAKE 1 TABLET(80 MG) BY MOUTH DAILY 09/15/20   Minna Merritts, MD  clopidogrel (PLAVIX) 75 MG tablet Take 1 tablet (75 mg total) by mouth daily. 08/04/20 08/04/21  Minna Merritts, MD  ezetimibe (ZETIA) 10 MG tablet TAKE 1 TABLET(10 MG) BY MOUTH DAILY 07/17/20   Marrianne Mood D, PA-C  Fluticasone-Umeclidin-Vilant (TRELEGY ELLIPTA) 100-62.5-25 MCG/INH AEPB Inhale 1 puff into the lungs daily. 12/25/19   Tyler Pita, MD  furosemide (LASIX) 20 MG tablet Take 20 mg by mouth daily.     [provider]  hydrALAZINE (APRESOLINE) 25 MG tablet Take 25 mg by mouth as needed.    [provider]   meclizine (ANTIVERT) 12.5 MG tablet Take 1 tablet (12.5 mg total) by mouth 3 (three) times daily as needed for dizziness. 03/20/20   Tyler Pita, MD  metoprolol succinate (TOPROL-XL) 100 MG 24 hr tablet TAKE 1 TABLET(100 MG) BY MOUTH DAILY WITH OR IMMEDIATELY FOLLOWING A MEAL 08/18/20   Loel Dubonnet, NP  nitroGLYCERIN (NITROSTAT) 0.4 MG SL tablet Place 1 tablet (0.4 mg total) under the tongue every 5 (five) minutes as needed for chest pain. 01/16/18   Minna Merritts, MD  omeprazole (PRILOSEC) 40 MG capsule Take 40 mg by mouth 2 (two) times daily. 01/16/20   [provider]  OXYGEN Inhale 2 L into the lungs daily as needed (oxygen).     [provider]  roflumilast (DALIRESP) 500 MCG TABS tablet Take 1 tablet (500 mcg total) by mouth every  other day. 07/25/20   Tyler Pita, MD    Allergies Paroxetine hcl, Serotonin reuptake inhibitors (ssris), Diazepam, Doxycycline monohydrate, Escitalopram oxalate, Lorazepam, and Tetracyclines & related  Family History  Problem Relation Age of Onset  . Coronary artery disease Brother   . Heart disease Brother 30  . CVA Mother   . Heart disease Father        MI x 8  . Coronary artery disease Father   . Kidney disease Neg Hx   . Prostate cancer Neg Hx     Social History Social History   Tobacco Use  . Smoking status: Former Smoker    Packs/day: 3.00    Years: 48.00    Pack years: 144.00    Types: E-cigarettes, Cigarettes    Quit date: 06/17/2009    Years since quitting: 11.3  . Smokeless tobacco: Never Used  . Tobacco comment: quit e-cigs 03/2020  Vaping Use  . Vaping Use: Former  . Quit date: 04/15/2020  Substance Use Topics  . Alcohol use: Yes    Alcohol/week: 6.0 standard drinks    Types: 6 Cans of beer per week    Comment: Occasional   . Drug use: No    Review of Systems  Gastrointestinal: No vomiting. Genitourinary: Negative for hematuria. Musculoskeletal: Positive for left forearm  injury. Skin: Negative for rash.   ____________________________________________   PHYSICAL EXAM:  VITAL SIGNS: ED Triage Vitals  Enc Vitals Group     BP 10/06/20 1103 (!) 177/100     Pulse Rate 10/06/20 1103 64     Resp 10/06/20 1103 20     Temp 10/06/20 1103 97.9 F (36.6 C)     Temp Source 10/06/20 1103 Oral     SpO2 10/06/20 1103 100 %     Weight 10/06/20 1105 183 lb (83 kg)     Height 10/06/20 1105 5\' 9"  (1.753 m)     Head Circumference --      [place] --      Pain Score 10/06/20 1105 0     Pain Loc --      Pain Edu? --      Excl. in [place]? --     Constitutional: Alert and oriented. Well appearing and in no acute distress. Eyes: Conjunctivae are normal.  Head: Atraumatic. Nose: No congestion/rhinnorhea. Mouth/Throat: Mucous membranes are moist.   Neck: Normal range of motion.  Cardiovascular: Normal rate, regular rhythm. Good peripheral circulation. Respiratory: Normal respiratory effort.  No retractions.  Musculoskeletal:  Extremities warm and well perfused.  Left forearm with approximately 1 cm subacute scabbed wound, with approximately 2 mm area of fresh bleeding which has now subsided. Neurologic:  Normal speech and language. No gross focal neurologic deficits are appreciated.  Skin:  Skin is warm and dry. No rash noted. Psychiatric: Mood and affect are normal. Speech and behavior are normal.  ____________________________________________   LABS (all labs ordered are listed, but only abnormal results are displayed)  Labs Reviewed - No data to display ____________________________________________  EKG   ____________________________________________  RADIOLOGY    ____________________________________________   PROCEDURES  Procedure(s) performed: No  Procedures  Critical Care performed: No ____________________________________________   INITIAL IMPRESSION / ASSESSMENT AND PLAN / ED COURSE  Pertinent labs & imaging results that were available  during my care of the patient were reviewed by me and considered in my medical decision making (see chart for details).  70 year old male with PMH as noted above including CAD on Plavix presents with  small wound to the left forearm which has been bleeding since this morning after he hit it against a door.  He had a subacute scab there from a prior minor injury.  However, by the time of my exam the bleeding had stopped and there was just a drop of fresh appearing blood on the wound.  There is no other abnormal bleeding or bruising.  At this time, there is no indication for work-up.  I applied a sterile dressing and a pressure bandage to maintain hemostasis.  There is no evidence of significant blood loss.  The patient is stable for discharge home.  Return precautions given, and he expresses understanding.  ____________________________________________   FINAL CLINICAL IMPRESSION(S) / ED DIAGNOSES  Final diagnoses:  Abrasion of left forearm, initial encounter      NEW MEDICATIONS STARTED DURING THIS VISIT:  Discharge Medication List as of 10/06/2020  1:19 PM       Note:  This document was prepared using Dragon voice recognition software and may include unintentional dictation errors.   Arta Silence, MD 10/06/20 [phone]

## 2020-10-06 NOTE — ED Triage Notes (Signed)
Patient to ER for c/o bleeding to left arm since 0830 after hitting on door. Patient has small skin tear to left dorsal forearm with small amount of bleeding present. Patient is currently on Plavix.

## 2020-10-06 NOTE — Discharge Instructions (Addendum)
Keep the dressing on until this evening when you can replace it with a smaller 1.  Return to the ER for new, worsening, or persistent severe bleeding, or any other new or worsening symptoms that concern you.

## 2020-10-06 NOTE — Progress Notes (Signed)
Patient presented to clinic bleeding profusely from left arm after having injured himself coming out of the car. He felt lightheaded.  Stated that he had 5 "towels" that were soaked with blood.  Patient was referred to ED for evaluation.  On examination of the wound it is still briskly bleeding and he may need sutures.  Visit today here is no charge.  Renold Don, MD  PCCM   *This note was dictated using voice recognition software/Dragon.  Despite best efforts to proofread, errors can occur which can change the meaning.  Any change was purely unintentional.

## 2020-10-06 NOTE — Progress Notes (Signed)
Carelink Summary Report / Loop Recorder 

## 2020-10-08 ENCOUNTER — Encounter: Payer: Self-pay | Admitting: Ophthalmology

## 2020-10-08 NOTE — Anesthesia Preprocedure Evaluation (Addendum)
Anesthesia Evaluation    History of Anesthesia Complications Negative for: history of anesthetic complications  Airway Mallampati: II  TM Distance: >3 FB Neck ROM: full    Dental  (+) Edentulous Upper, Edentulous Lower   Pulmonary COPD (severe, 2L oxygen 24/7),  oxygen dependent, Patient abstained from smoking., former smoker,    Pulmonary exam normal        Cardiovascular Exercise Tolerance: Good hypertension, + CAD, + Past MI (2015) and + Cardiac Stents (DES to RCA 2015; cardiac stent 12/12/2013 )  Normal cardiovascular exam+ dysrhythmias (Atrial tachycardia, paroxysmal )   ekg: 1/22: nsr;  echo: 12/21:  1. Left ventricular ejection fraction, by estimation, is 55 to 60%. The  left ventricle has normal function. The left ventricle has no regional  wall motion abnormalities. Left ventricular diastolic parameters are  consistent with Grade I diastolic dysfunction (impaired relaxation).  2. Right ventricular systolic function is normal. The right ventricular  size is normal. ;  cath: 2020:   Mid Cx to Dist Cx lesion is 90% stenosed.  Ost 1st Diag to 1st Diag lesion is 80% stenosed.  Previously placed Prox RCA to Mid RCA stent (unknown type) is widely patent.  Prox RCA lesion is 35% stenosed.  The left ventricular ejection fraction is 55-65% by visual estimate.  The left ventricular systolic function is normal.  LV end diastolic pressure is normal.  There is no mitral valve regurgitation.  Prox Cx lesion is 30% stenosed.   loop recorder implant. 07/16/2020  Zio monitor: 12/21: shows predominantly normal sinus rhythm. No evidence of atrial fibrillation or significant pause. Did not 8 runs of fast heart beat in the top portion of the heart which were asymptomatic as well as possible junctional rhythm.    Neuro/Psych Anxiety CVA (stroke 05/2020 with R sided residual numbness and balance issues), Residual Symptoms     GI/Hepatic Neg liver ROS, GERD  Controlled,  Endo/Other  negative endocrine ROS  Renal/GU Renal disease (ckd3;)  negative genitourinary   Musculoskeletal   Abdominal   Peds  Hematology negative hematology ROS (+)   Anesthesia Other Findings cards:  Minna Merritts, MD at 07/29/2020; pulm: Tyler Pita, MD at 07/04/2020;  diazepam > rapid HR; pt takes xanax.  Last plavix: 5/11;  R/b/a of anesthesia d/w pt and pt's wife.  proceed with Very light sedation.  Reproductive/Obstetrics                            Anesthesia Physical Anesthesia Plan  ASA: IV  Anesthesia Plan: MAC   Post-op Pain Management:    Induction:   PONV Risk Score and Plan: TIVA  Airway Management Planned:   Additional Equipment:   Intra-op Plan:   Post-operative Plan:   Informed Consent:   Plan Discussed with:   Anesthesia Plan Comments:         Anesthesia Quick Evaluation

## 2020-10-13 NOTE — Discharge Instructions (Signed)

## 2020-10-15 ENCOUNTER — Encounter: Payer: Self-pay | Admitting: Ophthalmology

## 2020-10-15 ENCOUNTER — Other Ambulatory Visit: Payer: Self-pay

## 2020-10-15 ENCOUNTER — Ambulatory Visit: Payer: HMO | Admitting: Anesthesiology

## 2020-10-15 ENCOUNTER — Encounter
Admission: RE | Disposition: A | Discharge status: Home / Self Care | Payer: Self-pay | Source: Home / Self Care | Attending: Ophthalmology

## 2020-10-15 ENCOUNTER — Ambulatory Visit
Admission: RE | Admit: 2020-10-15 | Discharge: 2020-10-15 | Disposition: A | Discharge status: Home / Self Care | Payer: HMO | Attending: Ophthalmology | Admitting: Ophthalmology

## 2020-10-15 DIAGNOSIS — I69351 Hemiplegia and hemiparesis following cerebral infarction affecting right dominant side: Secondary | ICD-10-CM | POA: Insufficient documentation

## 2020-10-15 DIAGNOSIS — Z881 Allergy status to other antibiotic agents status: Secondary | ICD-10-CM | POA: Insufficient documentation

## 2020-10-15 DIAGNOSIS — H2512 Age-related nuclear cataract, left eye: Secondary | ICD-10-CM | POA: Diagnosis not present

## 2020-10-15 DIAGNOSIS — Z888 Allergy status to other drugs, medicaments and biological substances status: Secondary | ICD-10-CM | POA: Insufficient documentation

## 2020-10-15 DIAGNOSIS — Z87891 Personal history of nicotine dependence: Secondary | ICD-10-CM | POA: Insufficient documentation

## 2020-10-15 DIAGNOSIS — N1832 Chronic kidney disease, stage 3b: Secondary | ICD-10-CM | POA: Diagnosis not present

## 2020-10-15 DIAGNOSIS — J439 Emphysema, unspecified: Secondary | ICD-10-CM | POA: Diagnosis not present

## 2020-10-15 DIAGNOSIS — I252 Old myocardial infarction: Secondary | ICD-10-CM | POA: Insufficient documentation

## 2020-10-15 DIAGNOSIS — Z8249 Family history of ischemic heart disease and other diseases of the circulatory system: Secondary | ICD-10-CM | POA: Insufficient documentation

## 2020-10-15 DIAGNOSIS — K219 Gastro-esophageal reflux disease without esophagitis: Secondary | ICD-10-CM | POA: Diagnosis not present

## 2020-10-15 DIAGNOSIS — Z7982 Long term (current) use of aspirin: Secondary | ICD-10-CM | POA: Insufficient documentation

## 2020-10-15 DIAGNOSIS — I130 Hypertensive heart and chronic kidney disease with heart failure and stage 1 through stage 4 chronic kidney disease, or unspecified chronic kidney disease: Secondary | ICD-10-CM | POA: Insufficient documentation

## 2020-10-15 DIAGNOSIS — I5030 Unspecified diastolic (congestive) heart failure: Secondary | ICD-10-CM | POA: Diagnosis not present

## 2020-10-15 DIAGNOSIS — Z7902 Long term (current) use of antithrombotics/antiplatelets: Secondary | ICD-10-CM | POA: Insufficient documentation

## 2020-10-15 DIAGNOSIS — Z7951 Long term (current) use of inhaled steroids: Secondary | ICD-10-CM | POA: Diagnosis not present

## 2020-10-15 DIAGNOSIS — G4730 Sleep apnea, unspecified: Secondary | ICD-10-CM | POA: Diagnosis not present

## 2020-10-15 DIAGNOSIS — Z9981 Dependence on supplemental oxygen: Secondary | ICD-10-CM | POA: Diagnosis not present

## 2020-10-15 DIAGNOSIS — Z79899 Other long term (current) drug therapy: Secondary | ICD-10-CM | POA: Diagnosis not present

## 2020-10-15 DIAGNOSIS — H25812 Combined forms of age-related cataract, left eye: Secondary | ICD-10-CM | POA: Diagnosis not present

## 2020-10-15 HISTORY — PX: CATARACT EXTRACTION W/PHACO: SHX586

## 2020-10-15 SURGERY — PHACOEMULSIFICATION, CATARACT, WITH IOL INSERTION
Anesthesia: Monitor Anesthesia Care | Site: Eye | Laterality: Left

## 2020-10-15 MED ORDER — LACTATED RINGERS IV SOLN: INTRAVENOUS | Status: DC

## 2020-10-15 MED ORDER — LABETALOL HCL 5 MG/ML IV SOLN
INTRAVENOUS | Status: DC | PRN
  Administered 2020-10-15: 5 mg via INTRAVENOUS

## 2020-10-15 MED ORDER — CEFUROXIME OPHTHALMIC INJECTION 1 MG/0.1 ML
INJECTION | OPHTHALMIC | Status: DC | PRN
  Administered 2020-10-15: 0.1 mL via INTRACAMERAL

## 2020-10-15 MED ORDER — NA HYALUR & NA CHOND-NA HYALUR 0.4-0.35 ML IO KIT
PACK | INTRAOCULAR | Status: DC | PRN
  Administered 2020-10-15: 1 mL via INTRAOCULAR

## 2020-10-15 MED ORDER — EPINEPHRINE PF 1 MG/ML IJ SOLN
INTRAOCULAR | Status: DC | PRN
  Administered 2020-10-15: 84 mL via OPHTHALMIC

## 2020-10-15 MED ORDER — MIDAZOLAM HCL 2 MG/2ML IJ SOLN
INTRAMUSCULAR | Status: DC | PRN
  Administered 2020-10-15 (×2): .5 mg via INTRAVENOUS

## 2020-10-15 MED ORDER — ARMC OPHTHALMIC DILATING DROPS
1.0000 "application " | OPHTHALMIC | Status: DC | PRN
  Administered 2020-10-15 (×3): 1 via OPHTHALMIC

## 2020-10-15 MED ORDER — TETRACAINE HCL 0.5 % OP SOLN
1.0000 [drp] | OPHTHALMIC | Status: DC | PRN
  Administered 2020-10-15 (×2): 1 [drp] via OPHTHALMIC

## 2020-10-15 MED ORDER — LIDOCAINE HCL (PF) 2 % IJ SOLN
INTRAOCULAR | Status: DC | PRN
  Administered 2020-10-15: 1 mL via INTRAMUSCULAR

## 2020-10-15 MED ORDER — FENTANYL CITRATE (PF) 100 MCG/2ML IJ SOLN
INTRAMUSCULAR | Status: DC | PRN
  Administered 2020-10-15 (×2): 25 ug via INTRAVENOUS

## 2020-10-15 MED ORDER — BRIMONIDINE TARTRATE-TIMOLOL 0.2-0.5 % OP SOLN
OPHTHALMIC | Status: DC | PRN
  Administered 2020-10-15: 1 [drp] via OPHTHALMIC

## 2020-10-15 SURGICAL SUPPLY — 22 items
CANNULA ANT/CHMB 27G (MISCELLANEOUS) ×1 IMPLANT
CANNULA ANT/CHMB 27GA (MISCELLANEOUS) ×2 IMPLANT
GLOVE SURG ENC TEXT LTX SZ7.5 (GLOVE) ×2 IMPLANT
GLOVE SURG TRIUMPH 8.0 PF LTX (GLOVE) ×2 IMPLANT
GOWN STRL REUS W/ TWL LRG LVL3 (GOWN DISPOSABLE) ×2 IMPLANT
GOWN STRL REUS W/TWL LRG LVL3 (GOWN DISPOSABLE) ×4
LENS IOL DIOP 24.0 (Intraocular Lens) ×2 IMPLANT
LENS IOL TECNIS MONO 24.0 (Intraocular Lens) IMPLANT
MARKER SKIN DUAL TIP RULER LAB (MISCELLANEOUS) ×2 IMPLANT
NDL CAPSULORHEX 25GA (NEEDLE) ×1 IMPLANT
NDL FILTER BLUNT 18X1 1/2 (NEEDLE) ×2 IMPLANT
NEEDLE CAPSULORHEX 25GA (NEEDLE) ×2 IMPLANT
NEEDLE FILTER BLUNT 18X 1/2SAF (NEEDLE) ×2
NEEDLE FILTER BLUNT 18X1 1/2 (NEEDLE) ×2 IMPLANT
PACK CATARACT BRASINGTON (MISCELLANEOUS) ×2 IMPLANT
PACK EYE AFTER SURG (MISCELLANEOUS) ×2 IMPLANT
PACK OPTHALMIC (MISCELLANEOUS) ×2 IMPLANT
SOLUTION OPHTHALMIC SALT (MISCELLANEOUS) ×2 IMPLANT
SYR 3ML LL SCALE MARK (SYRINGE) ×4 IMPLANT
SYR TB 1ML LUER SLIP (SYRINGE) ×2 IMPLANT
WATER STERILE IRR 250ML POUR (IV SOLUTION) ×2 IMPLANT
WIPE NON LINTING 3.25X3.25 (MISCELLANEOUS) ×2 IMPLANT

## 2020-10-15 NOTE — Transfer of Care (Signed)
Immediate Anesthesia Transfer of Care Note  Patient: William Mueller  Procedure(s) Performed: CATARACT EXTRACTION PHACO AND INTRAOCULAR LENS PLACEMENT (IOC) LEFT (Left Eye)  Patient Location: PACU  Anesthesia Type: MAC  Level of Consciousness: awake, alert  and patient cooperative  Airway and Oxygen Therapy: Patient Spontanous Breathing and Patient connected to supplemental oxygen  Post-op Assessment: Post-op Vital signs reviewed, Patient's Cardiovascular Status Stable, Respiratory Function Stable, Patent Airway and No signs of Nausea or vomiting  Post-op Vital Signs: Reviewed and stable  Complications: No complications documented.

## 2020-10-15 NOTE — Anesthesia Postprocedure Evaluation (Signed)
Anesthesia Post Note  Patient: William Mueller  Procedure(s) Performed: CATARACT EXTRACTION PHACO AND INTRAOCULAR LENS PLACEMENT (IOC) LEFT (Left Eye)     Patient location during evaluation: PACU Anesthesia Type: MAC Level of consciousness: awake and alert Pain management: pain level controlled Vital Signs Assessment: post-procedure vital signs reviewed and stable Respiratory status: spontaneous breathing, nonlabored ventilation, respiratory function stable and patient connected to nasal cannula oxygen Cardiovascular status: stable and blood pressure returned to baseline Postop Assessment: no apparent nausea or vomiting Anesthetic complications: no   No complications documented.  Fidel Levy

## 2020-10-15 NOTE — H&P (Signed)
[facility]   Primary Care Physician:  Philmore Pali, NP Ophthalmologist: Dr. Leandrew Koyanagi  Pre-Procedure History & Physical: HPI:  William Mueller is a 70 y.o. male here for ophthalmic surgery.   Past Medical History:  Diagnosis Date  . Anginal pain ([place]) 06/2017  . Anxiety   . Bronchitis 06/2017  . Chronic chest pain   . Chronic kidney disease (CKD) stage G3b/A1, moderately decreased glomerular filtration rate (GFR) between 30-44 mL/min/1.73 square meter and albuminuria creatinine ratio less than 30 mg/g (HCC)   . Chronic lower back pain    WITH LEG WEAKNESS  . COPD (chronic obstructive pulmonary disease) (HCC)    EMPHYSEMA, O2 AT 2L PRN  . Coronary artery disease    a. 12/2013 PCI: mRCA 100% with L to R collats s/p PCI/DES;  b. 06/2014 MV: no ischemia/infarct, EF 53%;  c. 03/2015 Cath: LM nl, LAD nl, D1 80 (1.65mm), LCX 68m (<1.5mm), OM1 nl, RCA 55p, patent stent, RPDA nl, EF 65%; d. 09/2015 Cath: LM nl, LAD 61m, D1 80 (<13mm), LCX 66m/d (<1.74mm), OM1 nl, RCA 55p (FFR 0.93), patent stent, RPDA nl-->Med Rx.  . Cryptogenic stroke ([place]) 05/07/2020   Some right sided weakness and balance issues  . Daily headache   . Depression   . Diastolic dysfunction    a. echo 12/2013: EF 55-60%, mild LVH, GR1DD, inf HK, elevated CVP, mildly dilated IVC suggestive of increased RA pressure  . Dyspnea    WHEEZING  . GERD (gastroesophageal reflux disease)    REFLUX  . Hearing loss   . High cholesterol   . Hypertension   . Myocardial infarction ([place]) 2015  . Nicotine addiction    a. using eCigs.  . Orthopnea   . Sleep apnea    No CPAP  . Status post placement of implantable loop recorder 07/16/2020  . Vertigo     Past Surgical History:  Procedure Laterality Date  . CARDIAC CATHETERIZATION     MC x 1 stent  . CARDIAC CATHETERIZATION Left 03/12/2015   Procedure: Left Heart Cath and Coronary Angiography;  Surgeon: Leonie Man, MD;  Location: [place] [facility];  Service:  Cardiovascular;  Laterality: Left;  . CARDIAC CATHETERIZATION N/A 09/29/2015   Procedure: Left Heart Cath and Coronary Angiography;  Surgeon: Wellington Hampshire, MD;  Location: [place] [facility];  Service: Cardiovascular;  Laterality: N/A;  . CATARACT EXTRACTION W/PHACO Right 08/10/2017   Procedure: CATARACT EXTRACTION PHACO AND INTRAOCULAR LENS PLACEMENT ([place]) RIGHT;  Surgeon: Leandrew Koyanagi, MD;  Location: [place];  Service: Ophthalmology;  Laterality: Right;  . CORONARY ANGIOPLASTY     STENT PLACEMENT  . LEFT HEART CATH AND CORONARY ANGIOGRAPHY N/A 09/06/2018   Procedure: LEFT HEART CATH AND CORONARY ANGIOGRAPHY;  Surgeon: Minna Merritts, MD;  Location: [place] [facility];  Service: Cardiovascular;  Laterality: N/A;  . LEFT HEART CATHETERIZATION WITH CORONARY ANGIOGRAM N/A 12/12/2013   Procedure: LEFT HEART CATHETERIZATION WITH CORONARY ANGIOGRAM;  Surgeon: Wellington Hampshire, MD;  Location: [place] CATH LAB;  Service: Cardiovascular;  Laterality: N/A;    Prior to Admission medications   Medication Sig Start Date End Date Taking? Authorizing Provider  acetaminophen (TYLENOL) 500 MG tablet Take 1,000 mg by mouth daily as needed for headache.    Yes [provider]  albuterol (PROVENTIL HFA;VENTOLIN HFA) 108 (90 Base) MCG/ACT inhaler Inhale into the lungs every 6 (six) hours as needed for wheezing or shortness of breath.   Yes [provider]  ALPRAZolam (XANAX XR) 3 MG 24 hr tablet Take 3 mg by mouth 2 (two) times daily. 1 MG IN THE MORNING AND 3 MG IN THE EVENING   Yes [provider]  ALPRAZolam (XANAX) 1 MG tablet Take 1 mg by mouth daily.  02/15/18  Yes [provider]  aspirin 81 MG EC tablet Take 1 tablet (81 mg total) by mouth daily. 09/04/15  Yes Minna Merritts, MD  atorvastatin (LIPITOR) 80 MG tablet TAKE 1 TABLET(80 MG) BY MOUTH DAILY 09/15/20  Yes Gollan, Kathlene November, MD  clopidogrel (PLAVIX) 75 MG tablet Take 1 tablet (75 mg total) by  mouth daily. 08/04/20 08/04/21 Yes Gollan, Kathlene November, MD  ezetimibe (ZETIA) 10 MG tablet TAKE 1 TABLET(10 MG) BY MOUTH DAILY 07/17/20  Yes Visser, Jacquelyn D, PA-C  Fluticasone-Umeclidin-Vilant (TRELEGY ELLIPTA) 100-62.5-25 MCG/INH AEPB Inhale 1 puff into the lungs daily. 12/25/19  Yes Tyler Pita, MD  furosemide (LASIX) 20 MG tablet Take 20 mg by mouth daily.    Yes [provider]  meclizine (ANTIVERT) 12.5 MG tablet Take 1 tablet (12.5 mg total) by mouth 3 (three) times daily as needed for dizziness. 03/20/20  Yes Tyler Pita, MD  metoprolol succinate (TOPROL-XL) 100 MG 24 hr tablet TAKE 1 TABLET(100 MG) BY MOUTH DAILY WITH OR IMMEDIATELY FOLLOWING A MEAL 08/18/20  Yes Loel Dubonnet, NP  nitroGLYCERIN (NITROSTAT) 0.4 MG SL tablet Place 1 tablet (0.4 mg total) under the tongue every 5 (five) minutes as needed for chest pain. 01/16/18  Yes Gollan, Kathlene November, MD  OXYGEN Inhale 2 L into the lungs daily as needed (oxygen).    Yes [provider]  pantoprazole (PROTONIX) 40 MG tablet Take 40 mg by mouth daily.   Yes [provider]  roflumilast (DALIRESP) 500 MCG TABS tablet Take 1 tablet (500 mcg total) by mouth every other day. 07/25/20  Yes Tyler Pita, MD  hydrALAZINE (APRESOLINE) 25 MG tablet Take 25 mg by mouth as needed. Patient not taking: Reported on 10/08/2020    [provider]  omeprazole (PRILOSEC) 40 MG capsule Take 40 mg by mouth 2 (two) times daily. Patient not taking: Reported on 10/08/2020 01/16/20   [provider]    Allergies as of 08/28/2020 - Review Complete 07/29/2020  Allergen Reaction Noted  . Paroxetine hcl Shortness Of Breath and Palpitations 06/08/2014  . Serotonin reuptake inhibitors (ssris) Shortness Of Breath and Palpitations 12/11/2013  . Diazepam Other (See Comments) 12/20/2013  . Doxycycline monohydrate Other (See Comments) 12/20/2013  . Escitalopram oxalate Other (See Comments) 12/20/2013  . Lorazepam  Other (See Comments) 12/20/2013  . Tetracyclines & related Itching and Rash 12/11/2013    Family History  Problem Relation Age of Onset  . Coronary artery disease Brother   . Heart disease Brother 2  . CVA Mother   . Heart disease Father        MI x 8  . Coronary artery disease Father   . Kidney disease Neg Hx   . Prostate cancer Neg Hx     Social History   Socioeconomic History  . Marital status: Married    Spouse name: Not on file  . Number of children: Not on file  . Years of education: Not on file  . Highest education level: Not on file  Occupational History  . Not on file  Tobacco Use  . Smoking status: Former Smoker    Packs/day: 3.00    Years: 48.00  Pack years: 144.00    Types: E-cigarettes, Cigarettes    Quit date: 06/17/2009    Years since quitting: 11.3  . Smokeless tobacco: Never Used  . Tobacco comment: quit e-cigs 03/2020  Vaping Use  . Vaping Use: Former  . Quit date: 04/15/2020  Substance and Sexual Activity  . Alcohol use: Yes    Alcohol/week: 6.0 standard drinks    Types: 6 Cans of beer per week    Comment: Occasional   . Drug use: No  . Sexual activity: Not Currently  Other Topics Concern  . Not on file  Social History Narrative  . Not on file   Social Determinants of Health   Financial Resource Strain: Not on file  Food Insecurity: Not on file  Transportation Needs: Not on file  Physical Activity: Not on file  Stress: Not on file  Social Connections: Not on file  Intimate Partner Violence: Not on file    Review of Systems: See HPI, otherwise negative ROS  Physical Exam: BP (!) 183/92   Pulse 73   Temp (!) 97.4 F (36.3 C) (Temporal)   Ht 5\' 8"  (1.727 m)   Wt 84.8 kg   SpO2 100%   BMI 28.43 kg/m  General:   Alert,  pleasant and cooperative in NAD Head:  Normocephalic and atraumatic. Lungs:  Clear to auscultation.  Decreased breath sounds bilat.  Heart:  Regular rate and rhythm.   Impression/Plan: William Mueller is  here for ophthalmic surgery.  Risks, benefits, limitations, and alternatives regarding ophthalmic surgery have been reviewed with the patient.  Questions have been answered.  All parties agreeable.   Leandrew Koyanagi, MD  10/15/2020, 8:34 AM

## 2020-10-15 NOTE — Op Note (Signed)
  OPERATIVE NOTE  NIAL HAWE [id number] 10/15/2020   PREOPERATIVE DIAGNOSIS:  Nuclear sclerotic cataract left eye. H25.12   POSTOPERATIVE DIAGNOSIS:    Nuclear sclerotic cataract left eye.     PROCEDURE:  Phacoemusification with posterior chamber intraocular lens placement of the left eye  Ultrasound time: Procedure(s) with comments: CATARACT EXTRACTION PHACO AND INTRAOCULAR LENS PLACEMENT (IOC) LEFT (Left) - sleep apnea CDE 15.52 1:55.5 minutes 13.5%  LENS:   Implant Name Type Inv. Item Serial No. Manufacturer Lot No. LRB No. Used Action  LENS IOL DIOP 24.0 - [id number] Intraocular Lens LENS IOL DIOP 24.0 [id number]   Left 1 Implanted      SURGEON:  Wyonia Hough, MD   ANESTHESIA:  Topical with tetracaine drops and 2% Xylocaine jelly, augmented with 1% preservative-free intracameral lidocaine.    COMPLICATIONS:  None.   DESCRIPTION OF PROCEDURE:  The patient was identified in the holding room and transported to the operating room and placed in the supine position under the operating microscope.  The left eye was identified as the operative eye and it was prepped and draped in the usual sterile ophthalmic fashion.   A 1 millimeter clear-corneal paracentesis was made at the 1:30 position.  0.5 ml of preservative-free 1% lidocaine was injected into the anterior chamber.  The anterior chamber was filled with Viscoat viscoelastic.  A 2.4 millimeter keratome was used to make a near-clear corneal incision at the 10:30 position.  .  A curvilinear capsulorrhexis was made with a cystotome and capsulorrhexis forceps.  Balanced salt solution was used to hydrodissect and hydrodelineate the nucleus.   Phacoemulsification was then used in stop and chop fashion to remove the lens nucleus and epinucleus.  The remaining cortex was then removed using the irrigation and aspiration handpiece. Provisc was then placed into the capsular bag to distend it for lens placement.  A lens  was then injected into the capsular bag.  The remaining viscoelastic was aspirated.   Wounds were hydrated with balanced salt solution.  The anterior chamber was inflated to a physiologic pressure with balanced salt solution.  No wound leaks were noted. Cefuroxime 0.1 ml of a 10mg /ml solution was injected into the anterior chamber for a dose of 1 mg of intracameral antibiotic at the completion of the case.   Timolol and Brimonidine drops were applied to the eye.  The patient was taken to the recovery room in stable condition without complications of anesthesia or surgery.  Bryceson Grape 10/15/2020, 9:48 AM

## 2020-10-15 NOTE — Anesthesia Procedure Notes (Signed)
Procedure Name: MAC Performed by: Kennady Zimmerle, CRNA Pre-anesthesia Checklist: Patient identified, Emergency Drugs available, Suction available, Timeout performed and Patient being monitored Patient Re-evaluated:Patient Re-evaluated prior to induction Oxygen Delivery Method: Nasal cannula Placement Confirmation: positive ETCO2       

## 2020-10-16 ENCOUNTER — Encounter: Payer: Self-pay | Admitting: Ophthalmology

## 2020-10-20 ENCOUNTER — Ambulatory Visit (INDEPENDENT_AMBULATORY_CARE_PROVIDER_SITE_OTHER): Payer: HMO

## 2020-10-20 DIAGNOSIS — G459 Transient cerebral ischemic attack, unspecified: Secondary | ICD-10-CM

## 2020-10-28 LAB — CUP PACEART REMOTE DEVICE CHECK
Date Time Interrogation Session: 20220519175430
Implantable Pulse Generator Implant Date: 20220209

## 2020-10-29 ENCOUNTER — Telehealth: Payer: Self-pay | Admitting: Pulmonary Disease

## 2020-10-29 MED ORDER — TRELEGY ELLIPTA 100-62.5-25 MCG/INH IN AEPB: 1.0000 | INHALATION_SPRAY | Freq: Every day | RESPIRATORY_TRACT | 2 refills | Status: DC

## 2020-10-29 NOTE — Telephone Encounter (Signed)
Rx for trelegy has been sent to preferred pharmacy.  Patient's spouse, Gloria(DPR) is aware and voiced her understanding. Nothing further needed at this time.

## 2020-10-31 ENCOUNTER — Telehealth: Payer: Self-pay | Admitting: Pulmonary Disease

## 2020-10-31 MED ORDER — TRELEGY ELLIPTA 100-62.5-25 MCG/INH IN AEPB: 1.0000 | INHALATION_SPRAY | Freq: Every day | RESPIRATORY_TRACT | 2 refills | Status: DC

## 2020-10-31 NOTE — Telephone Encounter (Signed)
Rx for trelegy has been sent to preferred pharmacy.  Patient is aware and voiced her understanding.  Nothing further needed at this time.

## 2020-10-31 NOTE — Telephone Encounter (Signed)
Spoke to [employer], who stated that trelegy Rx is ready for pickup.   ATC patient's spouse, Gloria(DPR) unable to leave vm due to mailbox not being setup.

## 2020-11-04 DIAGNOSIS — H353211 Exudative age-related macular degeneration, right eye, with active choroidal neovascularization: Secondary | ICD-10-CM | POA: Diagnosis not present

## 2020-11-04 NOTE — Telephone Encounter (Signed)
Called and spoke with William Mueller, patient's spouse (listed on the [facility]).  Advised that the prescription for Trelegy was ready at the pharmacy [facility]).  She states they picked it up Friday or Saturday.  Nothing further needed.

## 2020-11-11 ENCOUNTER — Telehealth: Payer: Self-pay | Admitting: Pulmonary Disease

## 2020-11-11 DIAGNOSIS — J449 Chronic obstructive pulmonary disease, unspecified: Secondary | ICD-10-CM

## 2020-11-11 NOTE — Telephone Encounter (Signed)
Spoke to patient's spouse, Gloria(DPR). [name] stated hat patient is interested in purchasing a oxygen concentrator out of pocket through [facility]. Order can be faxed to [phone]. Per [name] patient currently has a Conservation officer, nature with [employer] and [facility]. Patient would like to cancel order with [facility].   Rodena Piety, please advise on canceling order.  Dr. Patsey Berthold, please advise if okay to order additional concentrator?

## 2020-11-11 NOTE — Telephone Encounter (Signed)
Order for concentrator has been printed and placed in Dr. Domingo Dimes folder for signature.

## 2020-11-11 NOTE — Telephone Encounter (Signed)
Rx has been faxed to [facility].  [name] is aware and voiced her understanding.

## 2020-11-11 NOTE — Telephone Encounter (Signed)
Okay to discontinue order with [facility] and order concentrator from [facility].

## 2020-11-12 NOTE — Progress Notes (Signed)
Carelink Summary Report / Loop Recorder 

## 2020-11-13 NOTE — Telephone Encounter (Signed)
Margie I will need an order to D/C 02 with [facility]

## 2020-11-13 NOTE — Telephone Encounter (Signed)
The order has been sent to [facility] to D/C 02 and I have already received confirmation from [facility] that she has this order

## 2020-11-13 NOTE — Telephone Encounter (Signed)
Order has been placed to d/c oxygen with [facility].

## 2020-11-24 ENCOUNTER — Encounter: Payer: Self-pay | Admitting: Pulmonary Disease

## 2020-11-24 ENCOUNTER — Other Ambulatory Visit: Payer: Self-pay

## 2020-11-24 ENCOUNTER — Ambulatory Visit: Payer: HMO | Admitting: Pulmonary Disease

## 2020-11-24 VITALS — BP 142/82 | HR 87 | Temp 97.3°F | Ht 69.0 in | Wt 188.0 lb

## 2020-11-24 DIAGNOSIS — J9611 Chronic respiratory failure with hypoxia: Secondary | ICD-10-CM

## 2020-11-24 DIAGNOSIS — J449 Chronic obstructive pulmonary disease, unspecified: Secondary | ICD-10-CM | POA: Diagnosis not present

## 2020-11-24 DIAGNOSIS — F419 Anxiety disorder, unspecified: Secondary | ICD-10-CM

## 2020-11-24 DIAGNOSIS — F17291 Nicotine dependence, other tobacco product, in remission: Secondary | ICD-10-CM

## 2020-11-24 MED ORDER — ALBUTEROL SULFATE HFA 108 (90 BASE) MCG/ACT IN AERS: 2.0000 | INHALATION_SPRAY | Freq: Four times a day (QID) | RESPIRATORY_TRACT | 6 refills | Status: DC | PRN

## 2020-11-24 MED ORDER — BREZTRI AEROSPHERE 160-9-4.8 MCG/ACT IN AERO: 2.0000 | INHALATION_SPRAY | Freq: Two times a day (BID) | RESPIRATORY_TRACT | 0 refills | Status: DC

## 2020-11-24 NOTE — Patient Instructions (Signed)
Continue your inhalers.  On days when you are short of breath you can use your nebulizer 2-3 times a day.  Follow-up in 3 to 4 months time call sooner should any new problems arise.

## 2020-11-24 NOTE — Progress Notes (Signed)
Subjective:    Patient ID: William Mueller, male    DOB: 09-Sep-1950, 70 y.o.   MRN: [id number] Chief Complaint  Patient presents with   Follow-up   Requesting MD/Service: Self Date of initial consultation: 11/07/18 by Dr. Merton Border Reason for consultation: Very severe COPD, former smoker, chronic hypoxemic respiratory failure   PT PROFILE: 70 y.o. male former smoker (approx 100 p-y history, quit 2011), still vaping occasionally previously seen by Dr Ashby Dawes, Dr Raul Del and Dr. Alva Garnet.  I assumed his care after Dr. Alva Garnet' departure on 05/24/2019.   DATA: 09/29/17 CTA chest: no PE. No significant emphysema 12/23/15 PFTs: FVC: 2.10 > 2.33  L (47>53 %pred), FEV1: 0.91 >1.04 L (27% >31 %pred), FEV1/FVC: 43%, TLC:  L ( %pred), DLCO 46 %pred   INTERVAL: Last seen 07/01/2020.  No new respiratory complaint since then.  HPI 70 year old former smoker (quit 2011) presents for follow-up of COPD with chronic respiratory failure with hypoxia.  This is a scheduled visit.  He has no new respiratory symptoms.  He feels that Trelegy and as needed inhaler helped.  Last week he had difficulties with breathing due to the excessive heat and humidity.  He remained indoors to prevent exposure to the outside.  He has not had any fevers, chills or sweats.  No increased cough or sputum production.  No hemoptysis.  He notes that Trelegy is helping him.  Compliant with oxygen supplementation.  His only issue is that of anxiety.  He is actually on heavy doses of Xanax.  I have recommended that he discuss this with his primary care physician.  He may need low-dose antidepressant but I defer this to primary care.   Review of Systems A 10 point review of systems was performed and it is as noted above otherwise negative.  Patient Active Problem List   Diagnosis Date Noted   TIA (transient ischemic attack) 05/07/2020   Prolonged QT interval 05/07/2020   Chest pain 09/30/2017   Hypokalemia 09/29/2017    Near syncope 09/29/2017   Orthostatic hypotension 09/29/2017   Nicotine addiction    CAD S/P PCI DES to RCA 26/94/8546   Diastolic dysfunction    Chronic chest pain    Chest pain at rest 11/23/2014   GERD (gastroesophageal reflux disease) 06/08/2014   CKD (chronic kidney disease) stage 3, GFR 30-59 ml/min (HCC) 05/04/2014   Bilateral leg pain 05/04/2014   Lung nodule < 6cm on CT 05/04/2014   Abnormal CT scan, kidney 05/04/2014   Unstable angina ([place]) 05/04/2014   Atrial tachycardia, paroxysmal ([place]) 12/14/2013   Atherosclerosis of native coronary artery with unstable angina pectoris ([place]) 12/13/2013   Labile essential hypertension 12/11/2013   Acute renal failure superimposed on stage 3 chronic kidney disease ([place]) 12/11/2013   Hyperlipidemia 12/11/2013   COPD (chronic obstructive pulmonary disease) ([place]) 12/11/2013   Social History   Tobacco Use   Smoking status: Former    Packs/day: 3.00    Years: 48.00    Pack years: 144.00    Types: E-cigarettes, Cigarettes    Quit date: 06/17/2009    Years since quitting: 11.4   Smokeless tobacco: Never   Tobacco comments:    quit e-cigs 03/2020  Substance Use Topics   Alcohol use: Yes    Alcohol/week: 6.0 standard drinks    Types: 6 Cans of beer per week    Comment: Occasional    Allergies  Allergen Reactions   Paroxetine Hcl Shortness Of Breath and Palpitations   Serotonin Reuptake  Inhibitors ([place]) Shortness Of Breath and Palpitations   Diazepam Other (See Comments)    Rapid heartrate   Doxycycline Monohydrate Other (See Comments)    Unknown allergic reaction   Escitalopram Oxalate Other (See Comments)    serotonin Syndrome   Lorazepam Other (See Comments)    Unknown allergic reaction   Tetracyclines & Related Itching and Rash   Current Meds  Medication Sig   acetaminophen (TYLENOL) 500 MG tablet Take 1,000 mg by mouth daily as needed for headache.    albuterol (PROVENTIL HFA;VENTOLIN HFA) 108 (90 Base) MCG/ACT inhaler  Inhale into the lungs every 6 (six) hours as needed for wheezing or shortness of breath.   ALPRAZolam (XANAX XR) 3 MG 24 hr tablet Take 3 mg by mouth 2 (two) times daily. 1 MG IN THE MORNING AND 3 MG IN THE EVENING   ALPRAZolam (XANAX) 1 MG tablet Take 1 mg by mouth daily.    aspirin 81 MG EC tablet Take 1 tablet (81 mg total) by mouth daily.   atorvastatin (LIPITOR) 80 MG tablet TAKE 1 TABLET(80 MG) BY MOUTH DAILY   clopidogrel (PLAVIX) 75 MG tablet Take 1 tablet (75 mg total) by mouth daily.   ezetimibe (ZETIA) 10 MG tablet TAKE 1 TABLET(10 MG) BY MOUTH DAILY   Fluticasone-Umeclidin-Vilant (TRELEGY ELLIPTA) 100-62.5-25 MCG/INH AEPB Inhale 1 puff into the lungs daily.   furosemide (LASIX) 20 MG tablet Take 20 mg by mouth daily.    hydrALAZINE (APRESOLINE) 25 MG tablet Take 25 mg by mouth as needed.   meclizine (ANTIVERT) 12.5 MG tablet Take 1 tablet (12.5 mg total) by mouth 3 (three) times daily as needed for dizziness.   metoprolol succinate (TOPROL-XL) 100 MG 24 hr tablet TAKE 1 TABLET(100 MG) BY MOUTH DAILY WITH OR IMMEDIATELY FOLLOWING A MEAL   nitroGLYCERIN (NITROSTAT) 0.4 MG SL tablet Place 1 tablet (0.4 mg total) under the tongue every 5 (five) minutes as needed for chest pain.   omeprazole (PRILOSEC) 40 MG capsule Take 40 mg by mouth 2 (two) times daily.   OXYGEN Inhale 2 L into the lungs daily as needed (oxygen).    pantoprazole (PROTONIX) 40 MG tablet Take 40 mg by mouth daily.   roflumilast (DALIRESP) 500 MCG TABS tablet Take 1 tablet (500 mcg total) by mouth every other day.   Immunization History  Administered Date(s) Administered   Influenza, Seasonal, Injecte, Preservative Fre 04/08/2015   Influenza,inj,Quad PF,6+ Mos 03/14/2018   Influenza-Unspecified 03/10/2016, 05/22/2019   Janssen (J&J) SARS-COV-2 Vaccination 08/10/2019   Pneumococcal-Unspecified 02/05/2013       Objective:   Physical Exam BP (!) 142/82 (BP Location: Left Arm, Patient Position: Sitting, Cuff Size:  Normal)   Pulse 87   Temp (!) 97.3 F (36.3 C) (Temporal)   Ht 5\' 9"  (1.753 m)   Wt 188 lb (85.3 kg)   SpO2 97%   BMI 27.76 kg/m  GENERAL: Well-developed well-nourished, presents in transport chair with portable oxygen concentrator. Chronic use of accessories.  No conversational dyspnea. HEAD: Normocephalic, atraumatic. EYES: Pupils equal, round, reactive to light.  No scleral icterus. MOUTH: Nose/mouth/throat not examined due to masking requirements for COVID 19.   NECK: Supple. No thyromegaly. Trachea midline. No JVD.  No adenopathy. PULMONARY: Distant breath sounds, no wheezes or rhonchi, no other adventitious sounds.  CARDIOVASCULAR: S1 and S2. Regular rate and rhythm.  Distant heart tones, no rubs murmurs or gallops heard. GASTROINTESTINAL: Protuberant abdomen, soft. MUSCULOSKELETAL: No joint deformity, no clubbing, no edema. NEUROLOGIC: No focal  deficits noted.  Speech is fluent. SKIN: Intact,warm,dry.  At that exam no rashes. PSYCH: Mood and behavior normal.     Assessment & Plan:     ICD-10-CM   1. COPD, very severe ([place])  J44.9 CANCELED: AMB REFERRAL FOR DME   Well compensated on current regimen Use nebulizer on days when he is more short of breath    2. Chronic hypoxemic respiratory failure (HCC)  J96.11    Continue oxygen supplementation    3. Nicotine dependence, other tobacco product, in remission  F17.291    No evidence of relapse    4. Anxiety  F41.9    He is on significant doses of Xanax already Recommend coordinating with primary care May need low-dose antidepressant     Despite the severity of the disease he appears to be well compensated.  Continue current regimen.  He has been advised that he can use his nebulizer medication on days when he feels that he has to struggle to breathe,  particularly with changes in weather.  We will see the patient in follow-up in 3-4 months time he is to contact us prior to that time should any new problems arise.   Renold Don, MD [facility]   *This note was dictated using voice recognition software/Dragon.  Despite best efforts to proofread, errors can occur which can change the meaning.  Any change was purely unintentional.

## 2020-11-26 ENCOUNTER — Ambulatory Visit (INDEPENDENT_AMBULATORY_CARE_PROVIDER_SITE_OTHER): Payer: HMO

## 2020-11-26 DIAGNOSIS — I639 Cerebral infarction, unspecified: Secondary | ICD-10-CM | POA: Diagnosis not present

## 2020-11-26 LAB — CUP PACEART REMOTE DEVICE CHECK
Date Time Interrogation Session: 20220621175505
Implantable Pulse Generator Implant Date: 20220209

## 2020-12-09 DIAGNOSIS — H353211 Exudative age-related macular degeneration, right eye, with active choroidal neovascularization: Secondary | ICD-10-CM | POA: Diagnosis not present

## 2020-12-14 ENCOUNTER — Emergency Department: Payer: HMO

## 2020-12-14 ENCOUNTER — Emergency Department
Admission: EM | Admit: 2020-12-14 | Discharge: 2020-12-14 | Disposition: A | Discharge status: Home / Self Care | Payer: HMO | Attending: Emergency Medicine | Admitting: Emergency Medicine

## 2020-12-14 ENCOUNTER — Other Ambulatory Visit: Payer: Self-pay

## 2020-12-14 DIAGNOSIS — N183 Chronic kidney disease, stage 3 unspecified: Secondary | ICD-10-CM | POA: Insufficient documentation

## 2020-12-14 DIAGNOSIS — G930 Cerebral cysts: Secondary | ICD-10-CM | POA: Diagnosis not present

## 2020-12-14 DIAGNOSIS — J449 Chronic obstructive pulmonary disease, unspecified: Secondary | ICD-10-CM | POA: Insufficient documentation

## 2020-12-14 DIAGNOSIS — Z7982 Long term (current) use of aspirin: Secondary | ICD-10-CM | POA: Diagnosis not present

## 2020-12-14 DIAGNOSIS — S30811A Abrasion of abdominal wall, initial encounter: Secondary | ICD-10-CM | POA: Diagnosis not present

## 2020-12-14 DIAGNOSIS — W19XXXA Unspecified fall, initial encounter: Secondary | ICD-10-CM | POA: Diagnosis not present

## 2020-12-14 DIAGNOSIS — Z7951 Long term (current) use of inhaled steroids: Secondary | ICD-10-CM | POA: Diagnosis not present

## 2020-12-14 DIAGNOSIS — Z87891 Personal history of nicotine dependence: Secondary | ICD-10-CM | POA: Insufficient documentation

## 2020-12-14 DIAGNOSIS — W1830XA Fall on same level, unspecified, initial encounter: Secondary | ICD-10-CM | POA: Insufficient documentation

## 2020-12-14 DIAGNOSIS — Z23 Encounter for immunization: Secondary | ICD-10-CM | POA: Diagnosis not present

## 2020-12-14 DIAGNOSIS — S0990XA Unspecified injury of head, initial encounter: Secondary | ICD-10-CM | POA: Diagnosis not present

## 2020-12-14 DIAGNOSIS — I2510 Atherosclerotic heart disease of native coronary artery without angina pectoris: Secondary | ICD-10-CM | POA: Diagnosis not present

## 2020-12-14 DIAGNOSIS — R109 Unspecified abdominal pain: Secondary | ICD-10-CM | POA: Insufficient documentation

## 2020-12-14 DIAGNOSIS — T148XXA Other injury of unspecified body region, initial encounter: Secondary | ICD-10-CM

## 2020-12-14 DIAGNOSIS — S51012A Laceration without foreign body of left elbow, initial encounter: Secondary | ICD-10-CM | POA: Insufficient documentation

## 2020-12-14 DIAGNOSIS — I129 Hypertensive chronic kidney disease with stage 1 through stage 4 chronic kidney disease, or unspecified chronic kidney disease: Secondary | ICD-10-CM | POA: Diagnosis not present

## 2020-12-14 DIAGNOSIS — I10 Essential (primary) hypertension: Secondary | ICD-10-CM | POA: Diagnosis not present

## 2020-12-14 DIAGNOSIS — I700 Atherosclerosis of aorta: Secondary | ICD-10-CM | POA: Diagnosis not present

## 2020-12-14 DIAGNOSIS — T07XXXA Unspecified multiple injuries, initial encounter: Secondary | ICD-10-CM | POA: Diagnosis not present

## 2020-12-14 DIAGNOSIS — S59902A Unspecified injury of left elbow, initial encounter: Secondary | ICD-10-CM | POA: Diagnosis not present

## 2020-12-14 DIAGNOSIS — G319 Degenerative disease of nervous system, unspecified: Secondary | ICD-10-CM | POA: Diagnosis not present

## 2020-12-14 DIAGNOSIS — S50312A Abrasion of left elbow, initial encounter: Secondary | ICD-10-CM | POA: Diagnosis not present

## 2020-12-14 DIAGNOSIS — S3991XA Unspecified injury of abdomen, initial encounter: Secondary | ICD-10-CM | POA: Diagnosis not present

## 2020-12-14 DIAGNOSIS — Z7902 Long term (current) use of antithrombotics/antiplatelets: Secondary | ICD-10-CM | POA: Insufficient documentation

## 2020-12-14 DIAGNOSIS — R55 Syncope and collapse: Secondary | ICD-10-CM | POA: Diagnosis not present

## 2020-12-14 DIAGNOSIS — Y92009 Unspecified place in unspecified non-institutional (private) residence as the place of occurrence of the external cause: Secondary | ICD-10-CM | POA: Diagnosis not present

## 2020-12-14 DIAGNOSIS — S199XXA Unspecified injury of neck, initial encounter: Secondary | ICD-10-CM | POA: Diagnosis not present

## 2020-12-14 DIAGNOSIS — R58 Hemorrhage, not elsewhere classified: Secondary | ICD-10-CM | POA: Diagnosis not present

## 2020-12-14 DIAGNOSIS — R531 Weakness: Secondary | ICD-10-CM | POA: Diagnosis not present

## 2020-12-14 DIAGNOSIS — K4090 Unilateral inguinal hernia, without obstruction or gangrene, not specified as recurrent: Secondary | ICD-10-CM | POA: Diagnosis not present

## 2020-12-14 DIAGNOSIS — I639 Cerebral infarction, unspecified: Secondary | ICD-10-CM | POA: Diagnosis not present

## 2020-12-14 LAB — URINALYSIS, COMPLETE (UACMP) WITH MICROSCOPIC
Bacteria, UA: NONE SEEN
Bilirubin Urine: NEGATIVE
Glucose, UA: NEGATIVE mg/dL
Ketones, ur: NEGATIVE mg/dL
Leukocytes,Ua: NEGATIVE
Nitrite: NEGATIVE
Protein, ur: NEGATIVE mg/dL
Specific Gravity, Urine: 1.009 (ref 1.005–1.030)
Squamous Epithelial / HPF: NONE SEEN (ref 0–5)
pH: 7 (ref 5.0–8.0)

## 2020-12-14 LAB — BASIC METABOLIC PANEL
Anion gap: 8 (ref 5–15)
BUN: 17 mg/dL (ref 8–23)
CO2: 28 mmol/L (ref 22–32)
Calcium: 8.7 mg/dL — ABNORMAL LOW (ref 8.9–10.3)
Chloride: 98 mmol/L (ref 98–111)
Creatinine, Ser: 1.8 mg/dL — ABNORMAL HIGH (ref 0.61–1.24)
GFR, Estimated: 40 mL/min — ABNORMAL LOW (ref >60)
Glucose, Bld: 108 mg/dL — ABNORMAL HIGH (ref 70–99)
Potassium: 3.3 mmol/L — ABNORMAL LOW (ref 3.5–5.1)
Sodium: 134 mmol/L — ABNORMAL LOW (ref 135–145)

## 2020-12-14 LAB — CBC
HCT: 42.1 % (ref 39.0–52.0)
Hemoglobin: 13.8 g/dL (ref 13.0–17.0)
MCH: 28.5 pg (ref 26.0–34.0)
MCHC: 32.8 g/dL (ref 30.0–36.0)
MCV: 87 fL (ref 80.0–100.0)
Platelets: 225 10*3/uL (ref 150–400)
RBC: 4.84 MIL/uL (ref 4.22–5.81)
RDW: 12.7 % (ref 11.5–15.5)
WBC: 10 10*3/uL (ref 4.0–10.5)
nRBC: 0 % (ref 0.0–0.2)

## 2020-12-14 MED ORDER — BACITRACIN ZINC 500 UNIT/GM EX OINT
TOPICAL_OINTMENT | Freq: Once | CUTANEOUS | Status: AC
  Filled 2020-12-14: qty 1.8

## 2020-12-14 MED ORDER — IOHEXOL 300 MG/ML  SOLN
75.0000 mL | Freq: Once | INTRAMUSCULAR | Status: AC | PRN
  Administered 2020-12-14: 75 mL via INTRAVENOUS

## 2020-12-14 MED ORDER — FENTANYL CITRATE (PF) 100 MCG/2ML IJ SOLN
50.0000 ug | Freq: Once | INTRAMUSCULAR | Status: DC
  Filled 2020-12-14: qty 2

## 2020-12-14 MED ORDER — ACETAMINOPHEN 325 MG PO TABS
650.0000 mg | ORAL_TABLET | Freq: Once | ORAL | Status: AC
  Administered 2020-12-14: 650 mg via ORAL
  Filled 2020-12-14: qty 2

## 2020-12-14 MED ORDER — SODIUM CHLORIDE 0.9 % IV BOLUS (SEPSIS)
500.0000 mL | Freq: Once | INTRAVENOUS | Status: AC
  Administered 2020-12-14: 500 mL via INTRAVENOUS

## 2020-12-14 MED ORDER — TETANUS-DIPHTH-ACELL PERTUSSIS 5-2.5-18.5 LF-MCG/0.5 IM SUSY
0.5000 mL | PREFILLED_SYRINGE | Freq: Once | INTRAMUSCULAR | Status: AC
  Administered 2020-12-14: 0.5 mL via INTRAMUSCULAR
  Filled 2020-12-14: qty 0.5

## 2020-12-14 MED ORDER — ONDANSETRON HCL 4 MG/2ML IJ SOLN
4.0000 mg | Freq: Once | INTRAMUSCULAR | Status: DC
  Filled 2020-12-14: qty 2

## 2020-12-14 NOTE — ED Notes (Signed)
Wounds present to right flank/ buttocks/ left upper arm/ left elbow/ left forearm cleaned and bacitracin ointment applied. Guaze dressing applied to left forearm and elbow.

## 2020-12-14 NOTE — ED Provider Notes (Signed)
CT abd/pel without concerning acute findings. UA without obvious signs of infection. Discussed results with the patient. Will plan on discharging home.   Nance Pear, MD 12/14/20 [phone]

## 2020-12-14 NOTE — ED Provider Notes (Signed)
[facility] Emergency Department Provider Note ____________________________________________   Event Date/Time   First MD Initiated Contact with Patient 12/14/20 [phone]     (approximate)  I have reviewed the triage vital signs and the nursing notes.   HISTORY  Chief Complaint Fall    HPI William Mueller is a 70 y.o. male with history of COPD, CAD, CHF, hypertension, hyperlipidemia who presents to the emergency department after he fell at home.  States that he drank about 7 beers last night and he was getting up to go to bed.  States he was about 1 foot away from bed when he started to lose his balance and went backwards and fell on the ground.  States he hit his back on something and is complaining of right flank pain, buttock pain.  Has abrasions to the right flank, skin tears to the left elbow.  States he did not hit his head or lose consciousness.  No preceding symptoms that led to his fall.  Unknown last tetanus vaccination.  No numbness, tingling or weakness.  Wears oxygen at baseline.  Ambulates with no assistive devices.  States he does not drink alcohol every day.  He is on Plavix.         Past Medical History:  Diagnosis Date   Anginal pain ([place]) 06/2017   Anxiety    Bronchitis 06/2017   Chronic chest pain    Chronic kidney disease (CKD) stage G3b/A1, moderately decreased glomerular filtration rate (GFR) between 30-44 mL/min/1.73 square meter and albuminuria creatinine ratio less than 30 mg/g (HCC)    Chronic lower back pain    WITH LEG WEAKNESS   COPD (chronic obstructive pulmonary disease) (HCC)    EMPHYSEMA, O2 AT 2L PRN   Coronary artery disease    a. 12/2013 PCI: mRCA 100% with L to R collats s/p PCI/DES;  b. 06/2014 MV: no ischemia/infarct, EF 53%;  c. 03/2015 Cath: LM nl, LAD nl, D1 80 (1.37mm), LCX 75m (<1.53mm), OM1 nl, RCA 55p, patent stent, RPDA nl, EF 65%; d. 09/2015 Cath: LM nl, LAD 84m, D1 80 (<62mm), LCX 21m/d (<1.62mm), OM1 nl, RCA 55p (FFR  0.93), patent stent, RPDA nl-->Med Rx.   Cryptogenic stroke ([place]) 05/07/2020   Some right sided weakness and balance issues   Daily headache    Depression    Diastolic dysfunction    a. echo 12/2013: EF 55-60%, mild LVH, GR1DD, inf HK, elevated CVP, mildly dilated IVC suggestive of increased RA pressure   Dyspnea    WHEEZING   GERD (gastroesophageal reflux disease)    REFLUX   Hearing loss    High cholesterol    Hypertension    Myocardial infarction ([place]) 2015   Nicotine addiction    a. using eCigs.   Orthopnea    Sleep apnea    No CPAP   Status post placement of implantable loop recorder 07/16/2020   Vertigo     Patient Active Problem List   Diagnosis Date Noted   TIA (transient ischemic attack) 05/07/2020   Prolonged QT interval 05/07/2020   Chest pain 09/30/2017   Hypokalemia 09/29/2017   Near syncope 09/29/2017   Orthostatic hypotension 09/29/2017   Nicotine addiction    CAD S/P PCI DES to RCA 38/46/6599   Diastolic dysfunction    Chronic chest pain    Chest pain at rest 11/23/2014   GERD (gastroesophageal reflux disease) 06/08/2014   CKD (chronic kidney disease) stage 3, GFR 30-59 ml/min ([place]) 05/04/2014  Bilateral leg pain 05/04/2014   Lung nodule < 6cm on CT 05/04/2014   Abnormal CT scan, kidney 05/04/2014   Unstable angina ([place]) 05/04/2014   Atrial tachycardia, paroxysmal ([place]) 12/14/2013   Atherosclerosis of native coronary artery with unstable angina pectoris ([place]) 12/13/2013   Labile essential hypertension 12/11/2013   Acute renal failure superimposed on stage 3 chronic kidney disease ([place]) 12/11/2013   Hyperlipidemia 12/11/2013   COPD (chronic obstructive pulmonary disease) ([place]) 12/11/2013    Past Surgical History:  Procedure Laterality Date   CARDIAC CATHETERIZATION     MC x 1 stent   CARDIAC CATHETERIZATION Left 03/12/2015   Procedure: Left Heart Cath and Coronary Angiography;  Surgeon: Leonie Man, MD;  Location: [place] [facility];   Service: Cardiovascular;  Laterality: Left;   CARDIAC CATHETERIZATION N/A 09/29/2015   Procedure: Left Heart Cath and Coronary Angiography;  Surgeon: Wellington Hampshire, MD;  Location: [place] [facility];  Service: Cardiovascular;  Laterality: N/A;   CATARACT EXTRACTION W/PHACO Right 08/10/2017   Procedure: CATARACT EXTRACTION PHACO AND INTRAOCULAR LENS PLACEMENT ([place]) RIGHT;  Surgeon: Leandrew Koyanagi, MD;  Location: [place];  Service: Ophthalmology;  Laterality: Right;   CATARACT EXTRACTION W/PHACO Left 10/15/2020   Procedure: CATARACT EXTRACTION PHACO AND INTRAOCULAR LENS PLACEMENT ([place]) LEFT;  Surgeon: Leandrew Koyanagi, MD;  Location: [place];  Service: Ophthalmology;  Laterality: Left;  sleep apnea CDE 15.52 1:55.5 minutes 13.5%   CORONARY ANGIOPLASTY     STENT PLACEMENT   LEFT HEART CATH AND CORONARY ANGIOGRAPHY N/A 09/06/2018   Procedure: LEFT HEART CATH AND CORONARY ANGIOGRAPHY;  Surgeon: Minna Merritts, MD;  Location: [facility];  Service: Cardiovascular;  Laterality: N/A;   LEFT HEART CATHETERIZATION WITH CORONARY ANGIOGRAM N/A 12/12/2013   Procedure: LEFT HEART CATHETERIZATION WITH CORONARY ANGIOGRAM;  Surgeon: Wellington Hampshire, MD;  Location: [facility] [facility];  Service: Cardiovascular;  Laterality: N/A;    Prior to Admission medications   Medication Sig Start Date End Date Taking? Authorizing Provider  acetaminophen (TYLENOL) 500 MG tablet Take 1,000 mg by mouth daily as needed for headache.     [provider]  albuterol (VENTOLIN HFA) 108 (90 Base) MCG/ACT inhaler Inhale 2 puffs into the lungs every 6 (six) hours as needed for wheezing or shortness of breath. 11/24/20   Tyler Pita, MD  ALPRAZolam (XANAX XR) 3 MG 24 hr tablet Take 3 mg by mouth 2 (two) times daily. 1 MG IN THE MORNING AND 3 MG IN THE EVENING    [provider]  ALPRAZolam (XANAX) 1 MG tablet Take 1 mg by mouth daily.  02/15/18   [provider]   aspirin 81 MG EC tablet Take 1 tablet (81 mg total) by mouth daily. 09/04/15   Minna Merritts, MD  atorvastatin (LIPITOR) 80 MG tablet TAKE 1 TABLET(80 MG) BY MOUTH DAILY 09/15/20   Minna Merritts, MD  clopidogrel (PLAVIX) 75 MG tablet Take 1 tablet (75 mg total) by mouth daily. 08/04/20 08/04/21  Minna Merritts, MD  ezetimibe (ZETIA) 10 MG tablet TAKE 1 TABLET(10 MG) BY MOUTH DAILY 07/17/20   Marrianne Mood D, PA-C  Fluticasone-Umeclidin-Vilant (TRELEGY ELLIPTA) 100-62.5-25 MCG/INH AEPB Inhale 1 puff into the lungs daily. 10/31/20   Tyler Pita, MD  furosemide (LASIX) 20 MG tablet Take 20 mg by mouth daily.     [provider]  hydrALAZINE (APRESOLINE) 25 MG tablet Take 25 mg by mouth as needed.    [provider]  meclizine (ANTIVERT) 12.5 MG tablet Take 1 tablet (12.5 mg total) by mouth 3 (three) times daily as needed for dizziness. 03/20/20   Tyler Pita, MD  metoprolol succinate (TOPROL-XL) 100 MG 24 hr tablet TAKE 1 TABLET(100 MG) BY MOUTH DAILY WITH OR IMMEDIATELY FOLLOWING A MEAL 08/18/20   Loel Dubonnet, NP  nitroGLYCERIN (NITROSTAT) 0.4 MG SL tablet Place 1 tablet (0.4 mg total) under the tongue every 5 (five) minutes as needed for chest pain. 01/16/18   Minna Merritts, MD  omeprazole (PRILOSEC) 40 MG capsule Take 40 mg by mouth 2 (two) times daily. 01/16/20   [provider]  OXYGEN Inhale 2 L into the lungs daily as needed (oxygen).     [provider]  pantoprazole (PROTONIX) 40 MG tablet Take 40 mg by mouth daily.    [provider]  roflumilast (DALIRESP) 500 MCG TABS tablet Take 1 tablet (500 mcg total) by mouth every other day. 07/25/20   Tyler Pita, MD    Allergies Paroxetine hcl, Serotonin reuptake inhibitors (ssris), Diazepam, Doxycycline monohydrate, Escitalopram oxalate, Lorazepam, and Tetracyclines & related  Family History  Problem Relation Age of Onset   Coronary artery disease Brother     Heart disease Brother 66   CVA Mother    Heart disease Father        MI x 8   Coronary artery disease Father    Kidney disease Neg Hx    Prostate cancer Neg Hx     Social History Social History   Tobacco Use   Smoking status: Former    Packs/day: 3.00    Years: 48.00    Pack years: 144.00    Types: E-cigarettes, Cigarettes    Quit date: 06/17/2009    Years since quitting: 11.5   Smokeless tobacco: Never   Tobacco comments:    quit e-cigs 03/2020  Vaping Use   Vaping Use: Former   Quit date: 04/15/2020  Substance Use Topics   Alcohol use: Yes    Alcohol/week: 6.0 standard drinks    Types: 6 Cans of beer per week    Comment: Occasional    Drug use: No    Review of Systems Constitutional: No fever. Eyes: No visual changes. ENT: No sore throat. Cardiovascular: Denies chest pain. Respiratory: Denies shortness of breath. Gastrointestinal: No nausea, vomiting, diarrhea. Genitourinary: Negative for dysuria. Musculoskeletal: + for back pain. Skin: Negative for rash. Neurological: Negative for focal weakness or numbness.   ____________________________________________   PHYSICAL EXAM:  VITAL SIGNS: ED Triage Vitals  Enc Vitals Group     BP 12/14/20 0401 (!) 124/103     Pulse Rate 12/14/20 0401 71     Resp 12/14/20 0401 16     Temp 12/14/20 0401 (!) 97.5 F (36.4 C)     Temp src --      SpO2 12/14/20 0401 100 %     Weight 12/14/20 0402 188 lb (85.3 kg)     Height 12/14/20 0402 5\' 9"  (1.753 m)     Head Circumference --      Peak Flow --      Pain Score 12/14/20 0402 3     Pain Loc --      Pain Edu? --      Excl. in [place]? --    CONSTITUTIONAL: Alert and oriented and responds appropriately to questions. Well-appearing; well-nourished; GCS 15 HEAD: Normocephalic; atraumatic EYES: Conjunctivae clear, PERRL, EOMI ENT: normal nose; no rhinorrhea; moist mucous  membranes; pharynx without lesions noted; no dental injury; no septal hematoma NECK: Supple, no  meningismus, no LAD; no midline spinal tenderness, step-off or deformity; trachea midline CARD: RRR; S1 and S2 appreciated; no murmurs, no clicks, no rubs, no gallops RESP: Normal chest excursion without splinting or tachypnea; breath sounds clear and equal bilaterally; no wheezes, no rhonchi, no rales; no hypoxia on chronic 2 L of oxygen or respiratory distress CHEST:  chest wall stable, no crepitus or ecchymosis or deformity, nontender to palpation; no flail chest ABD/GI: Normal bowel sounds; non-distended; soft, non-tender, no rebound, no guarding; no ecchymosis or other lesions noted PELVIS:  stable, nontender to palpation, no leg length discrepancy BACK:  The back appears normal and is tender to palpation over the right flank with large associated abrasion and no ecchymosis or soft tissue swelling.  No midline spinal tenderness or step-off or deformity. EXT: Patient has soft tissue swelling, ecchymosis, skin tears to the left elbow.  No bony deformity.  Full range of motion in this joint.  2+ radial and DP pulses bilaterally.  Normal ROM in all joints; otherwise extremities are non-tender to palpation; no edema; normal capillary refill; no cyanosis,  no joint effusion, compartments are soft, extremities are warm and well-perfused SKIN: Normal color for age and race; warm NEURO: Moves all extremities equally, normal sensation diffusely, normal speech, no facial asymmetry PSYCH: The patient's mood and manner are appropriate. Grooming and personal hygiene are appropriate.    Right flank and buttock   Left elbow   Patient gave verbal permission to utilize photo for medical documentation only. The image was not stored on any personal device.  ____________________________________________   LABS (all labs ordered are listed, but only abnormal results are displayed)  Labs Reviewed  BASIC METABOLIC PANEL - Abnormal; Notable for the following components:      Result Value   Sodium 134 (*)     Potassium 3.3 (*)    Glucose, Bld 108 (*)    Creatinine, Ser 1.80 (*)    Calcium 8.7 (*)    GFR, Estimated 40 (*)    All other components within normal limits  CBC  URINALYSIS, COMPLETE (UACMP) WITH MICROSCOPIC   ____________________________________________  EKG   EKG Interpretation  Date/Time:  Sunday December 14 2020 04:12:43 EDT Ventricular Rate:  73 PR Interval:  154 QRS Duration: 78 QT Interval:  382 QTC Calculation: 420 R Axis:   62 Text Interpretation: Normal sinus rhythm ST & T wave abnormality, consider lateral ischemia Abnormal ECG Confirmed by Pryor Curia [phone]) on 12/14/2020 5:35:57 AM         ____________________________________________  RADIOLOGY Jessie Foot  [place], personally viewed and evaluated these images (plain radiographs) as part of my medical decision making, as well as reviewing the written report by the radiologist.  ED MD interpretation: CT head and cervical spine show no acute traumatic injury.  No fracture or dislocation of the left elbow.  Official radiology report(s): DG Elbow Complete Left  Result Date: 12/14/2020 CLINICAL DATA:  Fall with injury. EXAM: LEFT ELBOW - COMPLETE 3+ VIEW COMPARISON:  None. FINDINGS: Limited lateral view due to obliquity. No fracture or subluxation. No suspected joint effusion. No acute soft tissue finding. IMPRESSION: Negative. Electronically Signed   By: Monte Fantasia M.D.   On: 12/14/2020 06:50   CT Head Wo Contrast  Result Date: 12/14/2020 CLINICAL DATA:  Fall with injury EXAM: CT HEAD WITHOUT CONTRAST CT CERVICAL SPINE WITHOUT CONTRAST TECHNIQUE: Multidetector CT imaging of the head and  cervical spine was performed following the standard protocol without intravenous contrast. Multiplanar CT image reconstructions of the cervical spine were also generated. COMPARISON:  05/07/2020 FINDINGS: CT HEAD FINDINGS Brain: No evidence of acute infarction, hemorrhage, hydrocephalus, or mass lesion/mass effect. Arachnoid  cyst in the right middle cranial fossa measuring 3 x 2 cm on axial slices. Remote left anterior and inferior frontal infarct. Chronic small vessel ischemia in the hemispheric white matter. Small remote right cerebellar infarct. Cerebral atrophy. Vascular: No hyperdense vessel or unexpected calcification. Skull: Benign lucent area in the right parietal bone attributed to old insult. No acute fracture Sinuses/Orbits: Bilateral cataract resection. No evidence of injury. CT CERVICAL SPINE FINDINGS Alignment: No traumatic malalignment Skull base and vertebrae: No acute fracture Soft tissues and spinal canal: No prevertebral fluid or swelling. No visible canal hematoma. Disc levels:  Ordinary degenerative changes Upper chest: No evidence of injury IMPRESSION: No evidence of acute intracranial or cervical spine injury. Electronically Signed   By: Monte Fantasia M.D.   On: 12/14/2020 05:19   CT Cervical Spine Wo Contrast  Result Date: 12/14/2020 CLINICAL DATA:  Fall with injury EXAM: CT HEAD WITHOUT CONTRAST CT CERVICAL SPINE WITHOUT CONTRAST TECHNIQUE: Multidetector CT imaging of the head and cervical spine was performed following the standard protocol without intravenous contrast. Multiplanar CT image reconstructions of the cervical spine were also generated. COMPARISON:  05/07/2020 FINDINGS: CT HEAD FINDINGS Brain: No evidence of acute infarction, hemorrhage, hydrocephalus, or mass lesion/mass effect. Arachnoid cyst in the right middle cranial fossa measuring 3 x 2 cm on axial slices. Remote left anterior and inferior frontal infarct. Chronic small vessel ischemia in the hemispheric white matter. Small remote right cerebellar infarct. Cerebral atrophy. Vascular: No hyperdense vessel or unexpected calcification. Skull: Benign lucent area in the right parietal bone attributed to old insult. No acute fracture Sinuses/Orbits: Bilateral cataract resection. No evidence of injury. CT CERVICAL SPINE FINDINGS Alignment: No  traumatic malalignment Skull base and vertebrae: No acute fracture Soft tissues and spinal canal: No prevertebral fluid or swelling. No visible canal hematoma. Disc levels:  Ordinary degenerative changes Upper chest: No evidence of injury IMPRESSION: No evidence of acute intracranial or cervical spine injury. Electronically Signed   By: Monte Fantasia M.D.   On: 12/14/2020 05:19    ____________________________________________   PROCEDURES  Procedure(s) performed (including Critical Care):  Procedures   ____________________________________________   INITIAL IMPRESSION / ASSESSMENT AND PLAN / ED COURSE  As part of my medical decision making, I reviewed the following data within the [place] History obtained from family, Nursing notes reviewed and incorporated, Labs reviewed , EKG interpreted , Old chart reviewed, Patient signed out to Dr. Archie Balboa, Radiograph reviewed , and Notes from prior ED visits         Patient here with mechanical fall from home.  Multiple abrasions, skin tears.  Nothing that needs laceration repair at this time.  We will clean wound, apply bacitracin and dressings.  We will update his tetanus vaccination.  Will provide with pain medication.  CT of the head and cervical spine were obtained in triage and were unremarkable and showed no sign of acute injury.  Labs showed no significant change from baseline.  Will obtain x-ray of the left elbow and CT of the abdomen pelvis to evaluate for other traumatic injuries.  Patient is hemodynamically stable, neurologically intact.  ED PROGRESS  X-ray of the left elbow is unremarkable.  CT of the abdomen pelvis, urinalysis pending.  Signed out  the oncoming ED physician Dr. Archie Balboa.   I reviewed all nursing notes and pertinent previous records as available.  I have reviewed and interpreted any EKGs, lab and urine results, imaging (as available).    ____________________________________________   FINAL  CLINICAL IMPRESSION(S) / ED DIAGNOSES  Final diagnoses:  Fall, initial encounter  Multiple skin tears  Abrasions of multiple sites     ED Discharge Orders     None       *Please note:  ELIJIO STAPLES was evaluated in Emergency Department on 12/14/2020 for the symptoms described in the history of present illness. He was evaluated in the context of the global COVID-19 pandemic, which necessitated consideration that the patient might be at risk for infection with the SARS-CoV-2 virus that causes COVID-19. Institutional protocols and algorithms that pertain to the evaluation of patients at risk for COVID-19 are in a state of rapid change based on information released by regulatory bodies including the CDC and federal and state organizations. These policies and algorithms were followed during the patient's care in the ED.  Some ED evaluations and interventions may be delayed as a result of limited staffing during and the pandemic.*   Note:  This document was prepared using Dragon voice recognition software and may include unintentional dictation errors.    Narjis Mira, Delice Bison, DO 12/14/20 [phone]

## 2020-12-14 NOTE — Discharge Instructions (Addendum)
Please seek medical attention for any high fevers, chest pain, shortness of breath, change in behavior, persistent vomiting, bloody stool or any other new or concerning symptoms.  

## 2020-12-14 NOTE — ED Triage Notes (Signed)
Pt states fell backwards on carpet injuring self. Pt complains of weakness. Pt is on 2lpm oxygen currently. Pt with multiple lacerations to right back, hematoma with abrasion noted to right arm. Pt denies loc or striking head. Pt complains of buttock pain, no hip pain.

## 2020-12-16 NOTE — Progress Notes (Signed)
Carelink Summary Report / Loop Recorder 

## 2020-12-24 ENCOUNTER — Telehealth: Payer: Self-pay | Admitting: Pulmonary Disease

## 2020-12-24 NOTE — Telephone Encounter (Signed)
Last OV note has been faxed to [facility]. Chastity is aware and voiced her understanding.  Nothing further needed.

## 2020-12-29 ENCOUNTER — Ambulatory Visit (INDEPENDENT_AMBULATORY_CARE_PROVIDER_SITE_OTHER): Payer: HMO

## 2020-12-29 DIAGNOSIS — I639 Cerebral infarction, unspecified: Secondary | ICD-10-CM

## 2020-12-30 LAB — CUP PACEART REMOTE DEVICE CHECK
Date Time Interrogation Session: 20220724175730
Implantable Pulse Generator Implant Date: 20220209

## 2021-01-06 DIAGNOSIS — H34832 Tributary (branch) retinal vein occlusion, left eye, with macular edema: Secondary | ICD-10-CM | POA: Diagnosis not present

## 2021-01-06 DIAGNOSIS — H353211 Exudative age-related macular degeneration, right eye, with active choroidal neovascularization: Secondary | ICD-10-CM | POA: Diagnosis not present

## 2021-01-22 NOTE — Progress Notes (Signed)
Carelink Summary Report / Loop Recorder 

## 2021-01-23 ENCOUNTER — Telehealth: Payer: Self-pay

## 2021-01-23 NOTE — Telephone Encounter (Signed)
PA for Dailresp placed in Dr Domingo Dimes folder.

## 2021-01-27 ENCOUNTER — Ambulatory Visit (INDEPENDENT_AMBULATORY_CARE_PROVIDER_SITE_OTHER): Payer: HMO | Admitting: Cardiovascular Disease

## 2021-01-27 ENCOUNTER — Encounter: Payer: Self-pay | Admitting: Cardiovascular Disease

## 2021-01-27 ENCOUNTER — Telehealth: Payer: Self-pay

## 2021-01-27 ENCOUNTER — Other Ambulatory Visit: Payer: Self-pay

## 2021-01-27 VITALS — BP 145/80 | HR 80 | Ht 69.0 in | Wt 193.0 lb

## 2021-01-27 DIAGNOSIS — E785 Hyperlipidemia, unspecified: Secondary | ICD-10-CM | POA: Diagnosis not present

## 2021-01-27 DIAGNOSIS — I5032 Chronic diastolic (congestive) heart failure: Secondary | ICD-10-CM

## 2021-01-27 DIAGNOSIS — I639 Cerebral infarction, unspecified: Secondary | ICD-10-CM

## 2021-01-27 DIAGNOSIS — N183 Chronic kidney disease, stage 3 unspecified: Secondary | ICD-10-CM

## 2021-01-27 DIAGNOSIS — I25118 Atherosclerotic heart disease of native coronary artery with other forms of angina pectoris: Secondary | ICD-10-CM

## 2021-01-27 DIAGNOSIS — Z87891 Personal history of nicotine dependence: Secondary | ICD-10-CM

## 2021-01-27 DIAGNOSIS — I471 Supraventricular tachycardia: Secondary | ICD-10-CM | POA: Diagnosis not present

## 2021-01-27 DIAGNOSIS — J449 Chronic obstructive pulmonary disease, unspecified: Secondary | ICD-10-CM

## 2021-01-27 DIAGNOSIS — G459 Transient cerebral ischemic attack, unspecified: Secondary | ICD-10-CM | POA: Diagnosis not present

## 2021-01-27 MED ORDER — DALIRESP 500 MCG PO TABS: 500.0000 ug | ORAL_TABLET | ORAL | 1 refills | Status: DC

## 2021-01-27 MED ORDER — POTASSIUM CHLORIDE ER 10 MEQ PO TBCR: 10.0000 meq | EXTENDED_RELEASE_TABLET | Freq: Every day | ORAL | 3 refills | Status: DC

## 2021-01-27 MED ORDER — CLOPIDOGREL BISULFATE 75 MG PO TABS: 75.0000 mg | ORAL_TABLET | Freq: Every day | ORAL | 3 refills | Status: DC

## 2021-01-27 MED ORDER — EZETIMIBE 10 MG PO TABS: ORAL_TABLET | ORAL | 3 refills | Status: DC

## 2021-01-27 MED ORDER — HYDRALAZINE HCL 25 MG PO TABS
25.0000 mg | ORAL_TABLET | Freq: Three times a day (TID) | ORAL | 3 refills | Status: DC | PRN
Start: 2021-01-27 — End: 2021-01-27

## 2021-01-27 MED ORDER — HYDRALAZINE HCL 25 MG PO TABS: 25.0000 mg | ORAL_TABLET | Freq: Three times a day (TID) | ORAL | 3 refills | Status: DC | PRN

## 2021-01-27 MED ORDER — TRELEGY ELLIPTA 100-62.5-25 MCG/INH IN AEPB: 1.0000 | INHALATION_SPRAY | Freq: Every day | RESPIRATORY_TRACT | 11 refills | Status: DC

## 2021-01-27 NOTE — Patient Instructions (Addendum)
Medication Instructions:  Please take hydralazine 25 mg up to three times a day  as needed for pressure >160  Please start potassium 10 meq daily  If you need a refill on your cardiac medications before your next appointment, please call your pharmacy.    Lab work: No new labs needed   If you have labs (blood work) drawn today and your tests are completely normal, you will receive your results only by: [place] (if you have MyChart) OR A paper copy in the mail If you have any lab test that is abnormal or we need to change your treatment, we will call you to review the results.   Testing/Procedures: No new testing needed   Follow-Up: At [facility], you and your health needs are our priority.  As part of our continuing mission to provide you with exceptional heart care, we have created designated Provider Care Teams.  These Care Teams include your primary Cardiologist (physician) and Advanced Practice Providers (APPs -  Physician Assistants and Nurse Practitioners) who all work together to provide you with the care you need, when you need it.  You will need a follow up appointment in 12 months  Providers on your designated Care Team:   Murray Hodgkins, NP Christell Faith, PA-C Marrianne Mood, PA-C Cadence Kathlen Mody, [place]  Any Other Special Instructions Will Be Listed Below (If Applicable).  COVID-19 Vaccine Information can be found at: [email or web address].[email or web address] For questions related to vaccine distribution or appointments, please email [email or web address] or call [phone].

## 2021-01-27 NOTE — Progress Notes (Signed)
Date:  01/27/2021   ID:  William Mueller, DOB 1951-03-13, MRN [id number]  Patient Location:  [place] [id number]   Provider location:   [facility], [place] office  PCP:  Philmore Pali, NP  Cardiologist:  Patsy Baltimore  Chief Complaint  Patient presents with   6 month follow up     Patient c/o shortness of breath and fatigue. Medications reviewed by the patient verbally.     History of Present Illness:    William Mueller is a 70 y.o. male  past medical history of long history of smoking, COPD,  chronic renal insufficiency,  CR 1.7 CAD  12/11/2013 to [place] with chest pain cardiac catheterization lab : occluded mid RCA. Stent was placed.  Repeat catheterization in October 2016 with patent stent,disease of a small diagonal and distal circumflex not amenable to intervention.  paroxysmal atrial tachycardia Cardiac catheterization April 2017 He presents today for follow-up of his coronary artery disease, stable angina and COPD   LOV February 2022 with myself  BP at home 120s /70s Elevated in office today, walked in Wife confirms it is not very high at home 180 on initial check, recheck end of visit 145  Balance issues Feels poorly late afternoon, feels better after 1-5 beers Helps him to relax Does not drink alcohol every day  Linq in place: no arrhythmia Potassium running low 3.3 one-month ago   Chronic shortness of breath, on oxygen, 2 L Denies any recent COPD exacerbation  Denies any chest pain concerning for angina  EKG personally reviewed by myself on todays visit NSR rate 80 T wave anterolateral leads  Seen by EP 07/16/2020 May 07, 2020 concern for a stroke. Seen in the emergency department where imaging confirmed an acute infarct in his inferior left frontal lobe.  recommended to continue aspirin and Plavix indefinitely.   ZIO monitor performed after the event showed no episodes of atrial  fibrillation  loop recorder implant. 07/16/2020  Other past medical history reviewed  cardiac catheterization 09/06/2018  grossly patent vessels with Significant disease of a small diagonal branch and small distal left circumflex branches  other major branches of the RCA, left circumflex, LAD and large diagonal are intact with no significant disease  Mid Cx to Dist Cx lesion is 90% stenosed. Ost 1st Diag to 1st Diag lesion is 80% stenosed. Previously placed Prox RCA to Mid RCA stent (unknown type) is widely patent. Prox RCA lesion is 35% stenosed. The left ventricular ejection fraction is 55-65% by visual estimate. The left ventricular systolic function is normal. LV end diastolic pressure is normal. There is no mitral valve regurgitation. Prox Cx lesion is 30% stenosed.    : Medical management start Ranexa 500 mg twice daily for 1 week then up to 1000 mg twice daily It was felt his chest pain was secondary to small vessel disease   Lab work reviewed   total cholesterol 125 LDL 59 creatinine 1.73 normal electrolytes and LFTs    Past Medical History:  Diagnosis Date   Anginal pain ([place]) 06/2017   Anxiety    Bronchitis 06/2017   Chronic chest pain    Chronic kidney disease (CKD) stage G3b/A1, moderately decreased glomerular filtration rate (GFR) between 30-44 mL/min/1.73 square meter and albuminuria creatinine ratio less than 30 mg/g (HCC)    Chronic lower back pain    WITH LEG WEAKNESS   COPD (chronic obstructive pulmonary disease) ([place])  EMPHYSEMA, O2 AT 2L PRN   Coronary artery disease    a. 12/2013 PCI: mRCA 100% with L to R collats s/p PCI/DES;  b. 06/2014 MV: no ischemia/infarct, EF 53%;  c. 03/2015 Cath: LM nl, LAD nl, D1 80 (1.82m), LCX 961m<1.38m52m OM1 nl, RCA 55p, patent stent, RPDA nl, EF 65%; d. 09/2015 Cath: LM nl, LAD 63m49m 80 (<2mm)78mCX 12m/d53m.38mm), 28m nl, RCA 55p (FFR 0.93), patent stent, RPDA nl-->Med Rx.   Cryptogenic stroke (HCC) 12[place]/2021   Some right  sided weakness and balance issues   Daily headache    Depression    Diastolic dysfunction    a. echo 12/2013: EF 55-60%, mild LVH, GR1DD, inf HK, elevated CVP, mildly dilated IVC suggestive of increased RA pressure   Dyspnea    WHEEZING   GERD (gastroesophageal reflux disease)    REFLUX   Hearing loss    High cholesterol    Hypertension    Myocardial infarction (HCC) [place]  Nicotine addiction    a. using eCigs.   Orthopnea    Sleep apnea    No CPAP   Status post placement of implantable loop recorder 07/16/2020   Vertigo    Past Surgical History:  Procedure Laterality Date   CARDIAC CATHETERIZATION     MC x 1 stent   CARDIAC CATHETERIZATION Left 03/12/2015   Procedure: Left Heart Cath and Coronary Angiography;  Surgeon: David WLeonie Man[facility];  Service: Cardiovascular;  Laterality: Left;   CARDIAC CATHETERIZATION N/A 09/29/2015   Procedure: Left Heart Cath and Coronary Angiography;  Surgeon: MuhammaWellington HampshireLocation: [facility];  Service: Cardiovascular;  Laterality: N/A;   CATARACT EXTRACTION W/PHACO Right 08/10/2017   Procedure: CATARACT EXTRACTION PHACO AND INTRAOCULAR LENS PLACEMENT (IOC) [place];  Surgeon: BrasingLeandrew KoyanagiLocation: [place]: Ophthalmology;  Laterality: Right;   CATARACT EXTRACTION W/PHACO Left 10/15/2020   Procedure: CATARACT EXTRACTION PHAC[place] AND [place]TRAOCULAR LENS PLACEMENT ([place]OC) [place]  Surgeon: BrasingLeandrew KoyanagiLocation: [place]: Ophthalmology;  Laterality: Left;  sleep apnea CDE 15.52 1:55.5 minutes 13.5%   CORONARY ANGIOPLASTY     STENT PLACEMENT   LEFT HEART CATH AND CORONARY ANGIOGRAPHY N/A 09/06/2018   Procedure: LEFT HEART CATH AND CORONARY ANGIOGRAPHY;  Surgeon: Donovin Kraemer,Minna MerrittsLocation: [facility];  Service: Cardiovascular;  Laterality: N/A;   LEFT HEART CATHETERIZATION WITH CORONARY ANGIOGRAM N/A 12/12/2013   Procedure: LEFT HEART  CATHETERIZATION WITH CORONARY ANGIOGRAM;  Surgeon: MuhammaWellington HampshireLocation[facility];  Service: Cardiovascular;  Laterality: N/A;     Current Meds  Medication Sig   acetaminophen (TYLENOL) 500 MG tablet Take 1,000 mg by mouth daily as needed for headache.    albuterol (VENTOLIN HFA) 108 (90 Base) MCG/ACT inhaler Inhale 2 puffs into the lungs every 6 (six) hours as needed for wheezing or shortness of breath.   ALPRAZolam (XANAX XR) 3 MG 24 hr tablet Take 3 mg by mouth 2 (two) times daily. 1 MG IN THE MORNING AND 3 MG IN THE EVENING   ALPRAZolam (XANAX) 1 MG tablet Take 1 mg by mouth daily.    aspirin 81 MG EC tablet Take 1 tablet (81 mg total) by mouth daily.   atorvastatin (LIPITOR) 80 MG tablet TAKE 1 TABLET(80 MG) BY MOUTH DAILY   Fluticasone-Umeclidin-Vilant (TRELEGY ELLIPTA) 100-62.5-25 MCG/INH AEPB Inhale 1 puff into the lungs daily.  furosemide (LASIX) 20 MG tablet Take 20 mg by mouth daily.    hydrALAZINE (APRESOLINE) 25 MG tablet Take 25 mg by mouth as needed.   metoprolol succinate (TOPROL-XL) 100 MG 24 hr tablet TAKE 1 TABLET(100 MG) BY MOUTH DAILY WITH OR IMMEDIATELY FOLLOWING A MEAL   nitroGLYCERIN (NITROSTAT) 0.4 MG SL tablet Place 1 tablet (0.4 mg total) under the tongue every 5 (five) minutes as needed for chest pain.   OXYGEN Inhale 2 L into the lungs daily as needed (oxygen).    pantoprazole (PROTONIX) 40 MG tablet Take 40 mg by mouth daily.   roflumilast (DALIRESP) 500 MCG TABS tablet Take 1 tablet (500 mcg total) by mouth every other day.   clopidogrel (PLAVIX) 75 MG tablet Take 1 tablet (75 mg total) by mouth daily.    ezetimibe (ZETIA) 10 MG tablet TAKE 1 TABLET(10 MG) BY MOUTH DAILY     Allergies:   Paroxetine hcl, Serotonin reuptake inhibitors (ssris), Diazepam, Doxycycline monohydrate, Escitalopram oxalate, Lorazepam, and Tetracyclines & related   Social History   Tobacco Use   Smoking status: Former    Packs/day: 3.00    Years: 48.00    Pack years:  144.00    Types: E-cigarettes, Cigarettes    Quit date: 06/17/2009    Years since quitting: 11.6   Smokeless tobacco: Never   Tobacco comments:    quit e-cigs 03/2020  Vaping Use   Vaping Use: Former   Quit date: 04/15/2020  Substance Use Topics   Alcohol use: Yes    Alcohol/week: 6.0 standard drinks    Types: 6 Cans of beer per week    Comment: Occasional    Drug use: No     Family Hx: The patient's family history includes CVA in his mother; Coronary artery disease in his brother and father; Heart disease in his father; Heart disease (age of onset: 60) in his brother. There is no history of Kidney disease or Prostate cancer.  ROS:   Please see the history of present illness.    Review of Systems  Constitutional: Negative.   Respiratory:  Positive for shortness of breath.   Cardiovascular: Negative.   Gastrointestinal: Negative.   Musculoskeletal: Negative.   Neurological: Negative.   Psychiatric/Behavioral: Negative.    All other systems reviewed and are negative.    Labs/Other Tests and Data Reviewed:    Recent Labs: 05/07/2020: ALT 16 12/14/2020: BUN 17; Creatinine, Ser 1.80; Hemoglobin 13.8; Platelets 225; Potassium 3.3; Sodium 134   Recent Lipid Panel Lab Results  Component Value Date/Time   CHOL 98 05/08/2020 04:35 AM   CHOL 114 07/23/2019 11:55 AM   TRIG 79 05/08/2020 04:35 AM   HDL 31 (L) 05/08/2020 04:35 AM   HDL 38 (L) 07/23/2019 11:55 AM   CHOLHDL 3.2 05/08/2020 04:35 AM   LDLCALC 51 05/08/2020 04:35 AM   LDLCALC 53 07/23/2019 11:55 AM    Wt Readings from Last 3 Encounters:  01/27/21 193 lb (87.5 kg)  12/14/20 188 lb (85.3 kg)  11/24/20 188 lb (85.3 kg)     Exam:    BP (!) 180/90 (BP Location: Left Arm, Patient Position: Sitting, Cuff Size: Normal)   Pulse 80   Ht '5\' 9"'$  (1.753 m)   Wt 193 lb (87.5 kg)   SpO2 98% Comment: oxygen--2 Liters  BMI 28.50 kg/m  Constitutional:  oriented to person, place, and time. No distress.  On oxygen, presenting  in a wheelchair HENT:  Head: Grossly normal Eyes:  no discharge. No  scleral icterus.  Neck: No JVD, no carotid bruits  Cardiovascular: Regular rate and rhythm, no murmurs appreciated Pulmonary/Chest: Clear to auscultation bilaterally, no wheezes or rails Abdominal: Soft.  no distension.  no tenderness.  Musculoskeletal: Normal range of motion Neurological:  normal muscle tone. Coordination normal. No atrophy Skin: Skin warm and dry Psychiatric: normal affect, pleasant  ASSESSMENT & PLAN:    Atherosclerosis of native coronary artery of native heart with unstable angina pectoris [facility]) Prior catheterization last year, medical management recommended Currently with no symptoms of angina. No further workup at this time. Continue current medication regimen. Cholesterol at goal, non-smoker  Atrial tachycardia, paroxysmal (HCC) Stable, none on linq downloads  Stroke documented on CT scan On aspirin Plavix, cholesterol at goal Has Loop monitor in place  CKD (chronic kidney disease) stage 3, GFR 30-59 ml/min (HCC) Chronic kidney disease creatinine 1.8, stable stable  Centrilobular emphysema (HCC) On chronic oxygen, Stopped smoking Not very active, limited by COPD    Total encounter time more than 25 minutes  Greater than 50% was spent in counseling and coordination of care with the patient    Signed, Ida Rogue, MD  01/27/2021 2:45 PM    [place] [facility] [address] [place] [address], [place], [place] [address]

## 2021-01-27 NOTE — Telephone Encounter (Signed)
Trelegy Rx has been placed in Dr. Domingo Dimes folder for signature.  Will fax with application once signed.

## 2021-01-27 NOTE — Telephone Encounter (Signed)
[place] paper work placed in Dr Patsey Berthold folder with the [employer].

## 2021-01-28 NOTE — Telephone Encounter (Signed)
[employer] application has been faxed.  Patient's wife, Gloria(DPR) is aware and voiced her understanding.  She would like come by and pick up forms. Forms have been placed up front for pickup.  Nothing further needed.

## 2021-02-02 ENCOUNTER — Ambulatory Visit (INDEPENDENT_AMBULATORY_CARE_PROVIDER_SITE_OTHER): Payer: HMO

## 2021-02-02 DIAGNOSIS — I639 Cerebral infarction, unspecified: Secondary | ICD-10-CM | POA: Diagnosis not present

## 2021-02-02 LAB — CUP PACEART REMOTE DEVICE CHECK
Date Time Interrogation Session: 20220826175359
Implantable Pulse Generator Implant Date: 20220209

## 2021-02-06 ENCOUNTER — Other Ambulatory Visit: Payer: Self-pay | Admitting: Pulmonary Disease

## 2021-02-13 NOTE — Progress Notes (Signed)
Carelink Summary Report / Loop Recorder 

## 2021-02-17 ENCOUNTER — Telehealth: Payer: Self-pay | Admitting: Pulmonary Disease

## 2021-02-17 MED ORDER — ROFLUMILAST 500 MCG PO TABS: 500.0000 ug | ORAL_TABLET | Freq: Every day | ORAL | 6 refills | Status: DC

## 2021-02-17 NOTE — Telephone Encounter (Signed)
Spoke to patient's spouse, William Mueller(DPR). William Mueller stated that patient is taking Daliresp every other day. The cost of 15 tablets is 74 dollars and 30 tablets is 51 dollars. She is questioning if patient can take daliresp daily.   Dr. Patsey Berthold, please advise. Thanks

## 2021-02-17 NOTE — Telephone Encounter (Signed)
William Mueller(DPR) is aware of below message and voiced her understanding. Daliresp has been sent to preferred pharmacy. Nothing further needed at this time.

## 2021-02-17 NOTE — Telephone Encounter (Signed)
It is okay to take every day.  I think the reason why he was taking it every other day is because it can cause some GI upset but if he is tolerating it well he can definitely take it every day.

## 2021-02-27 DIAGNOSIS — I25119 Atherosclerotic heart disease of native coronary artery with unspecified angina pectoris: Secondary | ICD-10-CM | POA: Diagnosis not present

## 2021-02-27 DIAGNOSIS — F419 Anxiety disorder, unspecified: Secondary | ICD-10-CM | POA: Diagnosis not present

## 2021-02-27 DIAGNOSIS — J449 Chronic obstructive pulmonary disease, unspecified: Secondary | ICD-10-CM | POA: Diagnosis not present

## 2021-02-27 DIAGNOSIS — N183 Chronic kidney disease, stage 3 unspecified: Secondary | ICD-10-CM | POA: Diagnosis not present

## 2021-02-27 DIAGNOSIS — F322 Major depressive disorder, single episode, severe without psychotic features: Secondary | ICD-10-CM | POA: Diagnosis not present

## 2021-02-27 DIAGNOSIS — R972 Elevated prostate specific antigen [PSA]: Secondary | ICD-10-CM | POA: Diagnosis not present

## 2021-02-27 DIAGNOSIS — D692 Other nonthrombocytopenic purpura: Secondary | ICD-10-CM | POA: Diagnosis not present

## 2021-02-27 DIAGNOSIS — Z23 Encounter for immunization: Secondary | ICD-10-CM | POA: Diagnosis not present

## 2021-02-27 DIAGNOSIS — Z79899 Other long term (current) drug therapy: Secondary | ICD-10-CM | POA: Diagnosis not present

## 2021-03-02 ENCOUNTER — Telehealth: Payer: Self-pay | Admitting: Pulmonary Disease

## 2021-03-02 NOTE — Telephone Encounter (Signed)
Called and spoke to patient. Patient c/o anxiety, chest tightness and sob. Patient stated that attacks start around 3:00p everyday.   He is prescribed 1mg  of xanax. He takes this in the morning. He does not have Rx of 3mg .  Denied f/c/s, cough, wheezing or additional.  He seen PCP last week, however sx were not addressed. He wears 2L cont. Spo2 is maintaining at 98%.  Dr. Mortimer Fries, please advise. Thanks. Dr. Patsey Berthold, please advise. Thanks

## 2021-03-02 NOTE — Telephone Encounter (Signed)
Patient is aware of below recommendations and voiced his understanding.  He stated that he did not think sx were severe enough to go to the ED. He stated that he would contact PCP for recommendations.  Nothing further needed at this time.

## 2021-03-09 ENCOUNTER — Ambulatory Visit (INDEPENDENT_AMBULATORY_CARE_PROVIDER_SITE_OTHER): Payer: HMO

## 2021-03-09 DIAGNOSIS — I639 Cerebral infarction, unspecified: Secondary | ICD-10-CM | POA: Diagnosis not present

## 2021-03-11 LAB — CUP PACEART REMOTE DEVICE CHECK
Date Time Interrogation Session: 20220928175720
Implantable Pulse Generator Implant Date: 20220209

## 2021-03-17 NOTE — Progress Notes (Signed)
Carelink Summary Report / Loop Recorder 

## 2021-03-24 ENCOUNTER — Ambulatory Visit (INDEPENDENT_AMBULATORY_CARE_PROVIDER_SITE_OTHER): Payer: HMO | Admitting: Adult Health

## 2021-03-24 ENCOUNTER — Other Ambulatory Visit: Payer: Self-pay

## 2021-03-24 ENCOUNTER — Ambulatory Visit
Admission: RE | Admit: 2021-03-24 | Discharge: 2021-03-24 | Disposition: A | Discharge status: Home / Self Care | Payer: HMO | Source: Ambulatory Visit | Attending: Adult Health | Admitting: Adult Health

## 2021-03-24 ENCOUNTER — Encounter: Payer: Self-pay | Admitting: Adult Health

## 2021-03-24 ENCOUNTER — Ambulatory Visit
Admission: RE | Admit: 2021-03-24 | Discharge: 2021-03-24 | Disposition: A | Discharge status: Home / Self Care | Payer: HMO | Attending: Adult Health | Admitting: Adult Health

## 2021-03-24 VITALS — BP 130/88 | HR 87 | Temp 97.3°F | Ht 69.0 in | Wt 191.6 lb

## 2021-03-24 DIAGNOSIS — J9611 Chronic respiratory failure with hypoxia: Secondary | ICD-10-CM | POA: Diagnosis not present

## 2021-03-24 DIAGNOSIS — J432 Centrilobular emphysema: Secondary | ICD-10-CM

## 2021-03-24 DIAGNOSIS — J449 Chronic obstructive pulmonary disease, unspecified: Secondary | ICD-10-CM

## 2021-03-24 DIAGNOSIS — R0602 Shortness of breath: Secondary | ICD-10-CM | POA: Diagnosis not present

## 2021-03-24 MED ORDER — PREDNISONE 20 MG PO TABS: 20.0000 mg | ORAL_TABLET | Freq: Every day | ORAL | 0 refills | Status: DC

## 2021-03-24 NOTE — Progress Notes (Signed)
@Patient  ID: William Mueller, male    DOB: [birth year], 70 y.o.   MRN: [id number]  Chief Complaint  Patient presents with   Follow-up    Referring provider: Philmore Pali, NP  HPI: 70 year old male former smoker (heavy smoking history) followed for very severe COPD and oxygen dependent respiratory failure  TEST/EVENTS :  09/29/17 CTA chest: no PE. No significant emphysema 12/23/15 PFTs: FVC: 2.10 > 2.33  L (47>53 %pred), FEV1: 0.91 >1.04 L (27% >31 %pred), FEV1/FVC: 43%, TLC:  L ( %pred), DLCO 46 %pred 05/2020 Eosinophils abs -1500   03/24/2021 Follow up : COPD, O2 RF  Patient presents for 50-month follow-up.  Patient has very severe COPD and oxygen dependent respiratory failure.  He remains on Trelegy inhaler  and Daliresp daily .  Overall says his breathing is doing slightly worse. Feels like his breathing is going down hill over last 6 months . No increased cough or wheezing . Activity tolerance is very low. Gets winded with minimal activity .  Uses albuterol inhaler rarely -makes him nervous.  Remains on oxygen 2 L , says has to wear it all the time. No increased Oxygen demands.  Last chest x-ray 2020 with no acute process.  No chest pain , orthopnea or edema.  Could not afford pulmonary rehab.    Allergies  Allergen Reactions   Paroxetine Hcl Shortness Of Breath and Palpitations   Serotonin Reuptake Inhibitors (Ssris) Shortness Of Breath and Palpitations   Diazepam Other (See Comments)    Rapid heartrate   Doxycycline Monohydrate Other (See Comments)    Unknown allergic reaction   Escitalopram Oxalate Other (See Comments)    serotonin Syndrome   Lorazepam Other (See Comments)    Unknown allergic reaction   Tetracyclines & Related Itching and Rash    Immunization History  Administered Date(s) Administered   Influenza, Seasonal, Injecte, Preservative Fre 04/08/2015   Influenza,inj,Quad PF,6+ Mos 03/14/2018   Influenza-Unspecified 03/10/2016, 05/22/2019   Janssen (J&J)  SARS-COV-2 Vaccination 08/10/2019   Pneumococcal-Unspecified 02/05/2013   Tdap 12/14/2020    Past Medical History:  Diagnosis Date   Anginal pain ([place]) 06/2017   Anxiety    Bronchitis 06/2017   Chronic chest pain    Chronic kidney disease (CKD) stage G3b/A1, moderately decreased glomerular filtration rate (GFR) between 30-44 mL/min/1.73 square meter and albuminuria creatinine ratio less than 30 mg/g (HCC)    Chronic lower back pain    WITH LEG WEAKNESS   COPD (chronic obstructive pulmonary disease) (HCC)    EMPHYSEMA, O2 AT 2L PRN   Coronary artery disease    a. 12/2013 PCI: mRCA 100% with L to R collats s/p PCI/DES;  b. 06/2014 MV: no ischemia/infarct, EF 53%;  c. 03/2015 Cath: LM nl, LAD nl, D1 80 (1.71mm), LCX 93m (<1.24mm), OM1 nl, RCA 55p, patent stent, RPDA nl, EF 65%; d. 09/2015 Cath: LM nl, LAD 10m, D1 80 (<26mm), LCX 75m/d (<1.58mm), OM1 nl, RCA 55p (FFR 0.93), patent stent, RPDA nl-->Med Rx.   Cryptogenic stroke ([place]) 05/07/2020   Some right sided weakness and balance issues   Daily headache    Depression    Diastolic dysfunction    a. echo 12/2013: EF 55-60%, mild LVH, GR1DD, inf HK, elevated CVP, mildly dilated IVC suggestive of increased RA pressure   Dyspnea    WHEEZING   GERD (gastroesophageal reflux disease)    REFLUX   Hearing loss    High cholesterol    Hypertension    Myocardial infarction (  [place]) 2015   Nicotine addiction    a. using eCigs.   Orthopnea    Sleep apnea    No CPAP   Status post placement of implantable loop recorder 07/16/2020   Vertigo     Tobacco History: Social History   Tobacco Use  Smoking Status Former   Packs/day: 3.00   Years: 48.00   Pack years: 144.00   Types: E-cigarettes, Cigarettes   Quit date: 06/17/2009   Years since quitting: 11.7  Smokeless Tobacco Never  Tobacco Comments   quit e-cigs 03/2020   Counseling given: Not Answered Tobacco comments: quit e-cigs 03/2020   Outpatient Medications Prior to Visit  Medication  Sig Dispense Refill   acetaminophen (TYLENOL) 500 MG tablet Take 1,000 mg by mouth daily as needed for headache.      albuterol (VENTOLIN HFA) 108 (90 Base) MCG/ACT inhaler Inhale 2 puffs into the lungs every 6 (six) hours as needed for wheezing or shortness of breath. 8 g 6   ALPRAZolam (XANAX XR) 3 MG 24 hr tablet Take 3 mg by mouth 2 (two) times daily. 1 MG IN THE MORNING AND 3 MG IN THE EVENING     ALPRAZolam (XANAX) 1 MG tablet Take 1 mg by mouth daily.   5   aspirin 81 MG EC tablet Take 1 tablet (81 mg total) by mouth daily. 90 tablet 3   atorvastatin (LIPITOR) 80 MG tablet TAKE 1 TABLET(80 MG) BY MOUTH DAILY 90 tablet 2   clopidogrel (PLAVIX) 75 MG tablet Take 1 tablet (75 mg total) by mouth daily. 90 tablet 3   ezetimibe (ZETIA) 10 MG tablet TAKE 1 TABLET(10 MG) BY MOUTH DAILY 90 tablet 3   Fluticasone-Umeclidin-Vilant (TRELEGY ELLIPTA) 100-62.5-25 MCG/INH AEPB Inhale 1 puff into the lungs daily. 60 each 11   furosemide (LASIX) 20 MG tablet Take 20 mg by mouth daily.      hydrALAZINE (APRESOLINE) 25 MG tablet Take 1 tablet (25 mg total) by mouth 3 (three) times daily as needed. 90 tablet 3   meclizine (ANTIVERT) 12.5 MG tablet Take 1 tablet (12.5 mg total) by mouth 3 (three) times daily as needed for dizziness. 30 tablet 0   metoprolol succinate (TOPROL-XL) 100 MG 24 hr tablet TAKE 1 TABLET(100 MG) BY MOUTH DAILY WITH OR IMMEDIATELY FOLLOWING A MEAL 90 tablet 2   nitroGLYCERIN (NITROSTAT) 0.4 MG SL tablet Place 1 tablet (0.4 mg total) under the tongue every 5 (five) minutes as needed for chest pain. 25 tablet 1   OXYGEN Inhale 2 L into the lungs daily as needed (oxygen).      pantoprazole (PROTONIX) 40 MG tablet Take 40 mg by mouth daily.     potassium chloride (KLOR-CON) 10 MEQ tablet Take 1 tablet (10 mEq total) by mouth daily. 90 tablet 3   roflumilast (DALIRESP) 500 MCG TABS tablet Take 1 tablet (500 mcg total) by mouth daily. 30 tablet 6   No facility-administered medications prior  to visit.     Review of Systems:   Constitutional:   No  weight loss, night sweats,  Fevers, chills,  +fatigue, or  lassitude.  HEENT:   No headaches,  Difficulty swallowing,  Tooth/dental problems, or  Sore throat,                No sneezing, itching, ear ache, nasal congestion, post nasal drip,   CV:  No chest pain,  Orthopnea, PND, swelling in lower extremities, anasarca, dizziness, palpitations, syncope.   GI  No heartburn,  indigestion, abdominal pain, nausea, vomiting, diarrhea, change in bowel habits, loss of appetite, bloody stools.   Resp: .  No chest wall deformity  Skin: no rash or lesions.  GU: no dysuria, change in color of urine, no urgency or frequency.  No flank pain, no hematuria   MS:  No joint pain or swelling.  No decreased range of motion.  No back pain.    Physical Exam  BP 130/88 (BP Location: Left Arm, Patient Position: Sitting, Cuff Size: Normal)   Pulse 87   Temp (!) 97.3 F (36.3 C) (Oral)   Ht 5\' 9"  (1.753 m)   Wt 191 lb 9.6 oz (86.9 kg)   SpO2 95%   BMI 28.29 kg/m   GEN: A/Ox3; pleasant , NAD, chronically ill appearing in WC on O2    HEENT:  [place]/AT,   NOSE-clear, THROAT-clear, no lesions, no postnasal drip or exudate noted.   NECK:  Supple w/ fair ROM; no JVD; normal carotid impulses w/o bruits; no thyromegaly or nodules palpated; no lymphadenopathy.    RESP  Clear  P & A; w/o, wheezes/ rales/ or rhonchi. no accessory muscle use, no dullness to percussion  CARD:  RRR, no m/r/g, no peripheral edema, pulses intact, no cyanosis or clubbing.  GI:   Soft & nt; nml bowel sounds; no organomegaly or masses detected.   Musco: Warm bil, no deformities or joint swelling noted.   Neuro: alert, no focal deficits noted.    Skin: Warm, no lesions or rashes    Lab Results:        Imaging:     PFT Results Latest Ref Rng & Units 12/23/2015  FVC-Pre L 2.10  FVC-Predicted Pre % 47  FVC-Post L 2.33  FVC-Predicted Post % 53  Pre FEV1/FVC  % % 43  Post FEV1/FCV % % 44  FEV1-Pre L 0.91  FEV1-Predicted Pre % 27  FEV1-Post L 1.04  DLCO uncorrected ml/min/mmHg 14.36  DLCO [facility]% % 46  DLVA Predicted % 63    No results found for: NITRICOXIDE      Assessment & Plan:   COPD (chronic obstructive pulmonary disease) (HCC) Exacerbation - short steroid burst  Hold on abx at this time  Check cxr  Cont triple inhaler therapy   Plan  Patient Instructions  Continue on Trelegy inhaler  1 puff daily, rinse after use Continue on Daliresp daily Prednisone 20mg  daily for 5 days .  Mucinex DM Twice daily  As needed cough/congestion  Continue on oxygen 2 L Activity as tolerated Albuterol inhaler as needed Chest x-ray today Covid vaccine as discussed.  Follow-up with Dr. Patsey Berthold in 4 months and as needed Please contact office for sooner follow up if symptoms do not improve or worsen or seek emergency care         Chronic respiratory failure with hypoxia ([place]) Continue on o2      Yoel Kaufhold, NP 03/24/2021

## 2021-03-24 NOTE — Patient Instructions (Addendum)
Continue on Trelegy inhaler  1 puff daily, rinse after use Continue on Daliresp daily Prednisone 20mg  daily for 5 days .  Mucinex DM Twice daily  As needed cough/congestion  Continue on oxygen 2 L Activity as tolerated Albuterol inhaler as needed Chest x-ray today Covid vaccine as discussed.  Follow-up with Dr. Patsey Berthold in 4 months and as needed Please contact office for sooner follow up if symptoms do not improve or worsen or seek emergency care

## 2021-03-24 NOTE — Assessment & Plan Note (Signed)
Exacerbation - short steroid burst  Hold on abx at this time  Check cxr  Cont triple inhaler therapy   Plan  Patient Instructions  Continue on Trelegy inhaler  1 puff daily, rinse after use Continue on Daliresp daily Prednisone 20mg  daily for 5 days .  Mucinex DM Twice daily  As needed cough/congestion  Continue on oxygen 2 L Activity as tolerated Albuterol inhaler as needed Chest x-ray today Covid vaccine as discussed.  Follow-up with Dr. Patsey Berthold in 4 months and as needed Please contact office for sooner follow up if symptoms do not improve or worsen or seek emergency care

## 2021-03-24 NOTE — Assessment & Plan Note (Signed)
Continue on o2

## 2021-03-25 DIAGNOSIS — I129 Hypertensive chronic kidney disease with stage 1 through stage 4 chronic kidney disease, or unspecified chronic kidney disease: Secondary | ICD-10-CM | POA: Diagnosis not present

## 2021-03-25 DIAGNOSIS — N1832 Chronic kidney disease, stage 3b: Secondary | ICD-10-CM | POA: Diagnosis not present

## 2021-03-25 DIAGNOSIS — J449 Chronic obstructive pulmonary disease, unspecified: Secondary | ICD-10-CM | POA: Diagnosis not present

## 2021-03-25 DIAGNOSIS — R5381 Other malaise: Secondary | ICD-10-CM | POA: Diagnosis not present

## 2021-03-27 NOTE — Progress Notes (Signed)
Agree with the details of the visit as noted by Tammy Parrett, NP.  C. Laura Debi Cousin, MD [facility] 

## 2021-04-01 ENCOUNTER — Telehealth: Payer: Self-pay | Admitting: Adult Health

## 2021-04-01 NOTE — Telephone Encounter (Signed)
Chest x-ray shows COPD changes without sign of pneumonia Continue with office visit recommendations and follow-up Please contact office for sooner follow up if symptoms do not improve or worsen or seek emergency care   Patient's spouse, Gloria(DPR) is aware of results and voiced her understanding.  Nothing further needed.

## 2021-04-07 LAB — CUP PACEART REMOTE DEVICE CHECK
Date Time Interrogation Session: 20221031175643
Implantable Pulse Generator Implant Date: 20220209

## 2021-04-13 ENCOUNTER — Ambulatory Visit (INDEPENDENT_AMBULATORY_CARE_PROVIDER_SITE_OTHER): Payer: HMO

## 2021-04-13 DIAGNOSIS — I639 Cerebral infarction, unspecified: Secondary | ICD-10-CM | POA: Diagnosis not present

## 2021-04-14 DIAGNOSIS — H353211 Exudative age-related macular degeneration, right eye, with active choroidal neovascularization: Secondary | ICD-10-CM | POA: Diagnosis not present

## 2021-04-14 DIAGNOSIS — H34832 Tributary (branch) retinal vein occlusion, left eye, with macular edema: Secondary | ICD-10-CM | POA: Diagnosis not present

## 2021-04-15 DIAGNOSIS — I5032 Chronic diastolic (congestive) heart failure: Secondary | ICD-10-CM | POA: Diagnosis not present

## 2021-04-15 DIAGNOSIS — R3911 Hesitancy of micturition: Secondary | ICD-10-CM | POA: Diagnosis not present

## 2021-04-15 DIAGNOSIS — R911 Solitary pulmonary nodule: Secondary | ICD-10-CM | POA: Diagnosis not present

## 2021-04-15 DIAGNOSIS — N1832 Chronic kidney disease, stage 3b: Secondary | ICD-10-CM | POA: Diagnosis not present

## 2021-04-15 DIAGNOSIS — I272 Pulmonary hypertension, unspecified: Secondary | ICD-10-CM | POA: Diagnosis not present

## 2021-04-15 DIAGNOSIS — G8929 Other chronic pain: Secondary | ICD-10-CM | POA: Diagnosis not present

## 2021-04-15 DIAGNOSIS — E538 Deficiency of other specified B group vitamins: Secondary | ICD-10-CM | POA: Diagnosis not present

## 2021-04-15 DIAGNOSIS — F419 Anxiety disorder, unspecified: Secondary | ICD-10-CM | POA: Diagnosis not present

## 2021-04-15 DIAGNOSIS — I252 Old myocardial infarction: Secondary | ICD-10-CM | POA: Diagnosis not present

## 2021-04-15 DIAGNOSIS — D692 Other nonthrombocytopenic purpura: Secondary | ICD-10-CM | POA: Diagnosis not present

## 2021-04-15 DIAGNOSIS — H3530 Unspecified macular degeneration: Secondary | ICD-10-CM | POA: Diagnosis not present

## 2021-04-15 DIAGNOSIS — I25119 Atherosclerotic heart disease of native coronary artery with unspecified angina pectoris: Secondary | ICD-10-CM | POA: Diagnosis not present

## 2021-04-15 DIAGNOSIS — F4001 Agoraphobia with panic disorder: Secondary | ICD-10-CM | POA: Diagnosis not present

## 2021-04-15 DIAGNOSIS — F322 Major depressive disorder, single episode, severe without psychotic features: Secondary | ICD-10-CM | POA: Diagnosis not present

## 2021-04-15 DIAGNOSIS — G4730 Sleep apnea, unspecified: Secondary | ICD-10-CM | POA: Diagnosis not present

## 2021-04-15 DIAGNOSIS — I130 Hypertensive heart and chronic kidney disease with heart failure and stage 1 through stage 4 chronic kidney disease, or unspecified chronic kidney disease: Secondary | ICD-10-CM | POA: Diagnosis not present

## 2021-04-15 DIAGNOSIS — N529 Male erectile dysfunction, unspecified: Secondary | ICD-10-CM | POA: Diagnosis not present

## 2021-04-15 DIAGNOSIS — J432 Centrilobular emphysema: Secondary | ICD-10-CM | POA: Diagnosis not present

## 2021-04-15 DIAGNOSIS — M545 Low back pain, unspecified: Secondary | ICD-10-CM | POA: Diagnosis not present

## 2021-04-15 DIAGNOSIS — E876 Hypokalemia: Secondary | ICD-10-CM | POA: Diagnosis not present

## 2021-04-15 DIAGNOSIS — H9190 Unspecified hearing loss, unspecified ear: Secondary | ICD-10-CM | POA: Diagnosis not present

## 2021-04-15 DIAGNOSIS — K219 Gastro-esophageal reflux disease without esophagitis: Secondary | ICD-10-CM | POA: Diagnosis not present

## 2021-04-15 DIAGNOSIS — Z9181 History of falling: Secondary | ICD-10-CM | POA: Diagnosis not present

## 2021-04-15 DIAGNOSIS — Z7982 Long term (current) use of aspirin: Secondary | ICD-10-CM | POA: Diagnosis not present

## 2021-04-20 NOTE — Progress Notes (Signed)
Carelink Summary Report / Loop Recorder 

## 2021-04-23 ENCOUNTER — Telehealth: Payer: Self-pay | Admitting: Pulmonary Disease

## 2021-04-23 MED ORDER — ROFLUMILAST 500 MCG PO TABS: 500.0000 ug | ORAL_TABLET | Freq: Every day | ORAL | 6 refills | Status: DC

## 2021-04-23 NOTE — Telephone Encounter (Signed)
Spoke to patient's spouse, Gloria(DPR). [name] is requesting refill on Daliresp 500. Rx sent to preferred pharmacy.  Nothing further needed.

## 2021-05-05 ENCOUNTER — Telehealth: Payer: Self-pay | Admitting: Cardiovascular Disease

## 2021-05-05 NOTE — Telephone Encounter (Signed)
William Mueller with Home Health calling to report BP elevated At rest 168/98  After walking 178/98 Took another hydralazine May take a 3rd one Does not feel well Please call to discuss

## 2021-05-05 NOTE — Telephone Encounter (Signed)
Was able to return call to [place], she reports pt has not felt well last week, felt good over the weekend, and Monday started feel bad again. Pt feeling weak, SOB, and jittery.   Took Toprol 100 mg "early" this am (pt gets up around 5/5:30 am) This morning reports SBP>200 Took 25 mg hydralazine at 08:30 am.  Home health came out this morning and reports BP SBP >170 They gave pt bath and he felt week and SOB.  Stanton Kidney came out this afternoon (just left) and reports pt still not feeling well, BP still HTN  At rest 168/98  Stanton Kidney made pt get up and walk about 20 ft to BR, pt felt "SOB & Jittery". After walking 178/98 Mr. Sass took another hydralazine 25 mg  He was instructed by Stanton Kidney, RN to recheck his BP at 2 pm and if still elevated he could take his 3rd dose of hydralazine, but that would be last dose of the day.  Daneil Dan, if pt still not feeling well, SBP 170-200, feeling weak, SOB, and "jittery", do not want him to have a stroke, may need to be evaluated in the ED for a CVA/Cardiac work-up. He can call the office to have an appt for this Friday 12/2 at 10:30 am with Cadence Kathlen Mody, PA-C. This spot will be held for pt to call in for appt, as Stanton Kidney stated she would call pt after this conversation.   Stanton Kidney is thankful for the return call and recommendations, advised to let pt know of appt and to calling with any further concerns. Home Health will be back out tomorrow to check on him and reassess his BP.

## 2021-05-08 ENCOUNTER — Ambulatory Visit (INDEPENDENT_AMBULATORY_CARE_PROVIDER_SITE_OTHER): Payer: HMO | Admitting: Medical

## 2021-05-08 ENCOUNTER — Encounter: Payer: Self-pay | Admitting: Medical

## 2021-05-08 ENCOUNTER — Other Ambulatory Visit: Payer: Self-pay

## 2021-05-08 VITALS — BP 170/96 | HR 83 | Ht 69.0 in | Wt 193.0 lb

## 2021-05-08 DIAGNOSIS — I639 Cerebral infarction, unspecified: Secondary | ICD-10-CM | POA: Diagnosis not present

## 2021-05-08 DIAGNOSIS — E785 Hyperlipidemia, unspecified: Secondary | ICD-10-CM

## 2021-05-08 DIAGNOSIS — I471 Supraventricular tachycardia: Secondary | ICD-10-CM | POA: Diagnosis not present

## 2021-05-08 DIAGNOSIS — J449 Chronic obstructive pulmonary disease, unspecified: Secondary | ICD-10-CM

## 2021-05-08 DIAGNOSIS — I10 Essential (primary) hypertension: Secondary | ICD-10-CM

## 2021-05-08 DIAGNOSIS — N183 Chronic kidney disease, stage 3 unspecified: Secondary | ICD-10-CM

## 2021-05-08 DIAGNOSIS — I2510 Atherosclerotic heart disease of native coronary artery without angina pectoris: Secondary | ICD-10-CM | POA: Diagnosis not present

## 2021-05-08 NOTE — Progress Notes (Signed)
Cardiology Office Note:    Date:  05/08/2021   ID:  William Mueller, DOB Aug 13, 1950, MRN [id number]  PCP:  Leonides Sake, MD  [facility] Cardiologist:  Ida Rogue, MD  [facility] Electrophysiologist:  None   Referring MD: Leonides Sake, MD   Chief Complaint: high BP  History of Present Illness:    William Mueller is a 70 y.o. male with a hx of cryptogenic stroke with cardiac monitoring showing PAT/PSVT/?Junctional rhythm pending official read, CAD s/p 2015 PCI to RCA and repeat catheterization 2016 /2017/2020, chronic chest pain with negative enzymes, paroxysmal atrial tachycardia, COPD on 2 L of oxygen, obesity, hypertension, hyperlipidemia, HFrEF, CKD 3, GERD, anxiety, ?poor self reported pt tolerance of anesthesia?, prior history of smoking with h/o e-cigarette use /recently quit both smoking and vaping who presents for follow-up.   He has a h/o CAD with 12/2013 cardiac catheterization for CP and PCI/DES to RCA.  He underwent repeat cardiac cath in October 2016 for chest pain that showed patent stent and disease of small diagonal and distal circumflex but not amenable to intervention.  09/2015 cath for chest pain showed patent RCA stent and stable disease to proximal LAD, small first diagonal and the AV groove branch of the left circumflex with recommendation for medical therapy.  He was admitted 09/2017 for chest pain and ruled out with medical therapy advised.     09/2018 cath for recurrent CP that woke him from sleep showed etiology of chest pain could be secondary to small vessel disease.  Images were reviewed by interventional cardiology with recommendation for medical management.  He was started on Ranexa 500 mg twice daily for 1 week with plan to go up to 1000 mg twice daily thereafter.  Seen at follow-up in clinic and reportedly had stopped Ranexa due to a jittery feeling.    11/2018 ZIO monitor was performed for palpitations and showed NSR with 2 runs of SVT, as well as  ectopy.  Toprol was increased.     12/2018 echo showed EF 60 to 73% and diastolic dysfunction.  canceled secondary to lung issues/COPD.  BP was noted to be well controlled.   On 05/07/2020 He was admitted from the emergency department for complaints of numbness to the right side of his face and mild weakness without definite timeframe onset.  CT head in the ED showed new infarct of the inferior left frontal lobe.  Due to claustrophobia, he did not undergo MRI with neurology recommendation for ASA and Plavix indefinitely. CTA of the head and neck did not show any large vessel occlusion and showed mild bilateral carotid disease as copied and pasted below. Echo was performed with normal LV function.  Prolonged QT was noted on EKG.  loop recorder implant. 07/16/2020.   Last seem 01/27/21 and was overall doing well.   Today, the patient reports high blood pressures for the last 2 weeks. Last week is was as high as 213/103. He is taking his medications. Has been having neck pain. Denies no chest pain, dizziness, lightheadedness. Breathing is stable on 2L O2. No LLE, orthopnea, pnd. HE has hydralazine 25mg  to take as needed, has taken it twice. BP high today, he had his meds. Recommend going home and taking another hydralazine.    Past Medical History:  Diagnosis Date   Anginal pain ([place]) 06/2017   Anxiety    Bronchitis 06/2017   Chronic chest pain    Chronic kidney disease (CKD) stage G3b/A1, moderately decreased glomerular  filtration rate (GFR) between 30-44 mL/min/1.73 square meter and albuminuria creatinine ratio less than 30 mg/g (HCC)    Chronic lower back pain    WITH LEG WEAKNESS   COPD (chronic obstructive pulmonary disease) (HCC)    EMPHYSEMA, O2 AT 2L PRN   Coronary artery disease    a. 12/2013 PCI: mRCA 100% with L to R collats s/p PCI/DES;  b. 06/2014 MV: no ischemia/infarct, EF 53%;  c. 03/2015 Cath: LM nl, LAD nl, D1 80 (1.84mm), LCX 36m (<1.63mm), OM1 nl, RCA 55p, patent stent, RPDA nl, EF  65%; d. 09/2015 Cath: LM nl, LAD 64m, D1 80 (<49mm), LCX 44m/d (<1.6mm), OM1 nl, RCA 55p (FFR 0.93), patent stent, RPDA nl-->Med Rx.   Cryptogenic stroke ([place]) 05/07/2020   Some right sided weakness and balance issues   Daily headache    Depression    Diastolic dysfunction    a. echo 12/2013: EF 55-60%, mild LVH, GR1DD, inf HK, elevated CVP, mildly dilated IVC suggestive of increased RA pressure   Dyspnea    WHEEZING   GERD (gastroesophageal reflux disease)    REFLUX   Hearing loss    High cholesterol    Hypertension    Myocardial infarction ([place]) 2015   Nicotine addiction    a. using eCigs.   Orthopnea    Sleep apnea    No CPAP   Status post placement of implantable loop recorder 07/16/2020   Vertigo     Past Surgical History:  Procedure Laterality Date   CARDIAC CATHETERIZATION     MC x 1 stent   CARDIAC CATHETERIZATION Left 03/12/2015   Procedure: Left Heart Cath and Coronary Angiography;  Surgeon: Leonie Man, MD;  Location: [place] [facility];  Service: Cardiovascular;  Laterality: Left;   CARDIAC CATHETERIZATION N/A 09/29/2015   Procedure: Left Heart Cath and Coronary Angiography;  Surgeon: Wellington Hampshire, MD;  Location: [place] [facility];  Service: Cardiovascular;  Laterality: N/A;   CATARACT EXTRACTION W/PHACO Right 08/10/2017   Procedure: CATARACT EXTRACTION PHACO AND INTRAOCULAR LENS PLACEMENT ([place]) RIGHT;  Surgeon: Leandrew Koyanagi, MD;  Location: [place];  Service: Ophthalmology;  Laterality: Right;   CATARACT EXTRACTION W/PHACO Left 10/15/2020   Procedure: CATARACT EXTRACTION PHACO AND INTRAOCULAR LENS PLACEMENT ([place]) LEFT;  Surgeon: Leandrew Koyanagi, MD;  Location: [place];  Service: Ophthalmology;  Laterality: Left;  sleep apnea CDE 15.52 1:55.5 minutes 13.5%   CORONARY ANGIOPLASTY     STENT PLACEMENT   LEFT HEART CATH AND CORONARY ANGIOGRAPHY N/A 09/06/2018   Procedure: LEFT HEART CATH AND CORONARY ANGIOGRAPHY;  Surgeon:  Minna Merritts, MD;  Location: [place] [facility];  Service: Cardiovascular;  Laterality: N/A;   LEFT HEART CATHETERIZATION WITH CORONARY ANGIOGRAM N/A 12/12/2013   Procedure: LEFT HEART CATHETERIZATION WITH CORONARY ANGIOGRAM;  Surgeon: Wellington Hampshire, MD;  Location: [place] [facility];  Service: Cardiovascular;  Laterality: N/A;    Current Medications: Current Meds  Medication Sig   acetaminophen (TYLENOL) 500 MG tablet Take 1,000 mg by mouth daily as needed for headache.    albuterol (VENTOLIN HFA) 108 (90 Base) MCG/ACT inhaler Inhale 2 puffs into the lungs every 6 (six) hours as needed for wheezing or shortness of breath.   ALPRAZolam (XANAX XR) 3 MG 24 hr tablet Take 3 mg by mouth 2 (two) times daily. 1 MG IN THE MORNING AND 3 MG IN THE EVENING   ALPRAZolam (XANAX) 1 MG tablet Take 1 mg by mouth daily.  aspirin 81 MG EC tablet Take 1 tablet (81 mg total) by mouth daily.   atorvastatin (LIPITOR) 80 MG tablet TAKE 1 TABLET(80 MG) BY MOUTH DAILY   clopidogrel (PLAVIX) 75 MG tablet Take 1 tablet (75 mg total) by mouth daily.   ezetimibe (ZETIA) 10 MG tablet TAKE 1 TABLET(10 MG) BY MOUTH DAILY   Fluticasone-Umeclidin-Vilant (TRELEGY ELLIPTA) 100-62.5-25 MCG/INH AEPB Inhale 1 puff into the lungs daily.   furosemide (LASIX) 20 MG tablet Take 20 mg by mouth daily.    hydrALAZINE (APRESOLINE) 25 MG tablet Take 1 tablet (25 mg total) by mouth 3 (three) times daily as needed.   meclizine (ANTIVERT) 12.5 MG tablet Take 1 tablet (12.5 mg total) by mouth 3 (three) times daily as needed for dizziness.   metoprolol succinate (TOPROL-XL) 100 MG 24 hr tablet TAKE 1 TABLET(100 MG) BY MOUTH DAILY WITH OR IMMEDIATELY FOLLOWING A MEAL   nitroGLYCERIN (NITROSTAT) 0.4 MG SL tablet Place 1 tablet (0.4 mg total) under the tongue every 5 (five) minutes as needed for chest pain.   OXYGEN Inhale 2 L into the lungs daily as needed (oxygen).    pantoprazole (PROTONIX) 40 MG tablet Take 40 mg by mouth daily.    potassium chloride (KLOR-CON) 10 MEQ tablet Take 1 tablet (10 mEq total) by mouth daily.   predniSONE (DELTASONE) 20 MG tablet Take 1 tablet (20 mg total) by mouth daily with breakfast.   roflumilast (DALIRESP) 500 MCG TABS tablet Take 1 tablet (500 mcg total) by mouth daily.     Allergies:   Paroxetine hcl, Serotonin reuptake inhibitors (ssris), Diazepam, Doxycycline monohydrate, Escitalopram oxalate, Lorazepam, and Tetracyclines & related   Social History   Socioeconomic History   Marital status: Married    Spouse name: Not on file   Number of children: Not on file   Years of education: Not on file   Highest education level: Not on file  Occupational History   Not on file  Tobacco Use   Smoking status: Former    Packs/day: 3.00    Years: 48.00    Pack years: 144.00    Types: E-cigarettes, Cigarettes    Quit date: 06/17/2009    Years since quitting: 11.8   Smokeless tobacco: Never   Tobacco comments:    quit e-cigs 03/2020  Vaping Use   Vaping Use: Former   Quit date: 04/15/2020  Substance and Sexual Activity   Alcohol use: Yes    Alcohol/week: 6.0 standard drinks    Types: 6 Cans of beer per week    Comment: Occasional    Drug use: No   Sexual activity: Not Currently  Other Topics Concern   Not on file  Social History Narrative   Not on file   Social Determinants of Health   Financial Resource Strain: Not on file  Food Insecurity: Not on file  Transportation Needs: Not on file  Physical Activity: Not on file  Stress: Not on file  Social Connections: Not on file     Family History: The patient's family history includes CVA in his mother; Coronary artery disease in his brother and father; Heart disease in his father; Heart disease (age of onset: 74) in his brother. There is no history of Kidney disease or Prostate cancer.  ROS:   Please see the history of present illness.     All other systems reviewed and are negative.  EKGs/Labs/Other Studies Reviewed:     The following studies were reviewed today:  Echo 05/2020 1. Left  ventricular ejection fraction, by estimation, is 55 to 60%. The  left ventricle has normal function. The left ventricle has no regional  wall motion abnormalities. Left ventricular diastolic parameters are  consistent with Grade I diastolic  dysfunction (impaired relaxation).   2. Right ventricular systolic function is normal. The right ventricular  size is normal.   Event monitor 05/2020 Normal sinus rhythm No patient triggered events min HR of 45 bpm, max HR of 138 bpm, and avg HR of 66 bpm.   8 Supraventricular Tachycardia/atrial tachycardia runs occurred, the run with the fastest interval lasting 5 beats with a max rate of 138 bpm, the longest lasting 13 beats with an avg rate of 110 bpm.    Possible Junctional Rhythm was present.  Isolated SVEs were rare (<1.0%), SVE Couplets were rare (<1.0%), and SVE Triplets were rare (<1.0%). Isolated VEs were rare (<1.0%, 41), VE Couplets were rare (<1.0%, 3), and VE Triplets were rare (<1.0%, 2).   Signed, Esmond Plants, MD, Ph.D [facility]  [facility] 09/2018 Mid Cx to Dist Cx lesion is 90% stenosed. Ost 1st Diag to 1st Diag lesion is 80% stenosed. Previously placed Prox RCA to Mid RCA stent (unknown type) is widely patent. Prox RCA lesion is 35% stenosed. The left ventricular ejection fraction is 55-65% by visual estimate. The left ventricular systolic function is normal. LV end diastolic pressure is normal. There is no mitral valve regurgitation. Prox Cx lesion is 30% stenosed.  EKG:  EKG is  ordered today.  The ekg ordered today demonstrates NSR, 83bpm, TWI lateral leads, nonspecific ST changes  Recent Labs: 12/14/2020: BUN 17; Creatinine, Ser 1.80; Hemoglobin 13.8; Platelets 225; Potassium 3.3; Sodium 134  Recent Lipid Panel    Component Value Date/Time   CHOL 98 05/08/2020 0435   CHOL 114 07/23/2019 1155   TRIG 79 05/08/2020 0435   HDL 31 (L) 05/08/2020 0435    HDL 38 (L) 07/23/2019 1155   CHOLHDL 3.2 05/08/2020 0435   VLDL 16 05/08/2020 0435   LDLCALC 51 05/08/2020 0435   LDLCALC 53 07/23/2019 1155     Physical Exam:    VS:  BP (!) 170/96   Pulse 83   Ht 5\' 9"  (1.753 m)   Wt 193 lb (87.5 kg)   SpO2 92%   BMI 28.50 kg/m     Wt Readings from Last 3 Encounters:  05/08/21 193 lb (87.5 kg)  03/24/21 191 lb 9.6 oz (86.9 kg)  01/27/21 193 lb (87.5 kg)     GEN:  Well nourished, well developed in no acute distress HEENT: Normal NECK: No JVD; No carotid bruits LYMPHATICS: No lymphadenopathy CARDIAC: RRR, no murmurs, rubs, gallops RESPIRATORY:  diffusely diminished  ABDOMEN: Soft, non-tender, non-distended MUSCULOSKELETAL:  No edema; No deformity  SKIN: Warm and dry NEUROLOGIC:  Alert and oriented x 3 PSYCHIATRIC:  Normal affect   ASSESSMENT:    1. Labile essential hypertension   2. Hyperlipidemia LDL goal <70   3. Coronary artery disease involving native coronary artery of native heart without angina pectoris   4. Chronic obstructive pulmonary disease, unspecified COPD type ([place])   5. Stage 3 chronic kidney disease, unspecified whether stage 3a or 3b CKD ([place])   6. Atrial tachycardia, paroxysmal ([place])   7. Essential hypertension   8. Cryptogenic stroke (HCC)    PLAN:    In order of problems listed above:  HTN Patient reports elevated BP for the last 2 weeks, unsure as to why. He takes Toprol 100mg  daily  and hydralazine 25mg  as needed. BP today high 170/96. He denies and chest pain, SOB, dizziness, headache, lightheadedness. He was instructed to go home and take hydralazine 25mg  right away and re-check BP in 2 hours. Start hydralazine 25mg  TID. Continue Toprol. We will re-check BP in 2 weeks.   CAD Patient denies anginal symptoms. Cath in 2020 showing small vessel disease and otherwise nonobstructive CAD, recommended medical management. Continue Aspirin and Plavix, statin and BB.   Paroxysmal tachycardia No reoccurrence.  Continue Toprol.   H/o Stroke Continue plavix, ASA, and statin. He has loop recorder in place followed by EP.   CKD stage 3 Scr 1.8 in July 2022, which is baseline.   HLD LDL 31 05/2020. Continue statin and Zetia. Needs updated lipid panel.   HFpEF Echo 05/2020 showed LVEF 55-60%, no WMA, G1DD. He is euvolemic on exam. Continue current lasix dose. Continue BB  COPD Breathing is stable on 2 L.  Disposition: Follow up in 2 week(s) with MD/APP    Signed, Audi Conover Ninfa Meeker, PA-C  05/08/2021 11:37 AM    [facility]

## 2021-05-08 NOTE — Patient Instructions (Signed)
Medication Instructions:  Your physician recommends that you continue on your current medications as directed. Please refer to the Current Medication list given to you today.   Make sure to take your Hydralazine 25 mg three times a day.  *If you need a refill on your cardiac medications before your next appointment, please call your pharmacy*   Lab Work: None ordered If you have labs (blood work) drawn today and your tests are completely normal, you will receive your results only by: [place] (if you have MyChart) OR A paper copy in the mail If you have any lab test that is abnormal or we need to change your treatment, we will call you to review the results.   Testing/Procedures: None ordered   Follow-Up: At [facility], you and your health needs are our priority.  As part of our continuing mission to provide you with exceptional heart care, we have created designated Provider Care Teams.  These Care Teams include your primary Cardiologist (physician) and Advanced Practice Providers (APPs -  Physician Assistants and Nurse Practitioners) who all work together to provide you with the care you need, when you need it.  We recommend signing up for the patient portal called "MyChart".  Sign up information is provided on this After Visit Summary.  MyChart is used to connect with patients for Virtual Visits (Telemedicine).  Patients are able to view lab/test results, encounter notes, upcoming appointments, etc.  Non-urgent messages can be sent to your provider as well.   To learn more about what you can do with MyChart, go to [email or web address].    Your next appointment:   2 week(s)  The format for your next appointment:   In Person  Provider:   You may see Ida Rogue, MD or one of the following Advanced Practice Providers on your designated Care Team:   Murray Hodgkins, NP Christell Faith, PA-C Cadence Kathlen Mody, PA-C:1}    Other Instructions N/A

## 2021-05-13 LAB — CUP PACEART REMOTE DEVICE CHECK
Date Time Interrogation Session: 20221203175858
Implantable Pulse Generator Implant Date: 20220209

## 2021-05-14 ENCOUNTER — Telehealth: Payer: Self-pay | Admitting: Cardiovascular Disease

## 2021-05-14 NOTE — Telephone Encounter (Signed)
Called and spoke with patients wife. She stated that the patient had thought he had some chest pain last night but also thought that he could have dreamt it. She stated that he is feeling fine today and was able to do some exercised with home health. She did say his weight was up a bit today from 185.3 lbs yesterday to 187 lbs today. I advised patients wife that if the chest pain returns and is unresolved by [place], or if he continues to put on weight of 2-3 lbs in a day, of 5 lbs in a week, to give [place] a call back. Patient has a follow up scheduled for 05/25/21. Patients wife verbalized understanding, agreed with plan and expressed gratefulness for the call.

## 2021-05-14 NOTE — Telephone Encounter (Signed)
Carrie with [facility] calling to report  Patient had chest pain last night that reminded him of heart attack in 2015 Increased SOB  Did not take nitroglycerin  No chest pain this morning Please call patient to discuss

## 2021-05-15 DIAGNOSIS — R3911 Hesitancy of micturition: Secondary | ICD-10-CM | POA: Diagnosis not present

## 2021-05-15 DIAGNOSIS — I252 Old myocardial infarction: Secondary | ICD-10-CM | POA: Diagnosis not present

## 2021-05-15 DIAGNOSIS — H9190 Unspecified hearing loss, unspecified ear: Secondary | ICD-10-CM | POA: Diagnosis not present

## 2021-05-15 DIAGNOSIS — J432 Centrilobular emphysema: Secondary | ICD-10-CM | POA: Diagnosis not present

## 2021-05-15 DIAGNOSIS — F4001 Agoraphobia with panic disorder: Secondary | ICD-10-CM | POA: Diagnosis not present

## 2021-05-15 DIAGNOSIS — F419 Anxiety disorder, unspecified: Secondary | ICD-10-CM | POA: Diagnosis not present

## 2021-05-15 DIAGNOSIS — N1832 Chronic kidney disease, stage 3b: Secondary | ICD-10-CM | POA: Diagnosis not present

## 2021-05-15 DIAGNOSIS — F322 Major depressive disorder, single episode, severe without psychotic features: Secondary | ICD-10-CM | POA: Diagnosis not present

## 2021-05-15 DIAGNOSIS — E782 Mixed hyperlipidemia: Secondary | ICD-10-CM | POA: Diagnosis not present

## 2021-05-15 DIAGNOSIS — E876 Hypokalemia: Secondary | ICD-10-CM | POA: Diagnosis not present

## 2021-05-15 DIAGNOSIS — M545 Low back pain, unspecified: Secondary | ICD-10-CM | POA: Diagnosis not present

## 2021-05-15 DIAGNOSIS — R911 Solitary pulmonary nodule: Secondary | ICD-10-CM | POA: Diagnosis not present

## 2021-05-15 DIAGNOSIS — K219 Gastro-esophageal reflux disease without esophagitis: Secondary | ICD-10-CM | POA: Diagnosis not present

## 2021-05-15 DIAGNOSIS — E538 Deficiency of other specified B group vitamins: Secondary | ICD-10-CM | POA: Diagnosis not present

## 2021-05-15 DIAGNOSIS — N529 Male erectile dysfunction, unspecified: Secondary | ICD-10-CM | POA: Diagnosis not present

## 2021-05-15 DIAGNOSIS — I25119 Atherosclerotic heart disease of native coronary artery with unspecified angina pectoris: Secondary | ICD-10-CM | POA: Diagnosis not present

## 2021-05-15 DIAGNOSIS — Z7982 Long term (current) use of aspirin: Secondary | ICD-10-CM | POA: Diagnosis not present

## 2021-05-15 DIAGNOSIS — G8929 Other chronic pain: Secondary | ICD-10-CM | POA: Diagnosis not present

## 2021-05-15 DIAGNOSIS — I5032 Chronic diastolic (congestive) heart failure: Secondary | ICD-10-CM | POA: Diagnosis not present

## 2021-05-15 DIAGNOSIS — G4730 Sleep apnea, unspecified: Secondary | ICD-10-CM | POA: Diagnosis not present

## 2021-05-15 DIAGNOSIS — H3530 Unspecified macular degeneration: Secondary | ICD-10-CM | POA: Diagnosis not present

## 2021-05-15 DIAGNOSIS — D692 Other nonthrombocytopenic purpura: Secondary | ICD-10-CM | POA: Diagnosis not present

## 2021-05-15 DIAGNOSIS — I130 Hypertensive heart and chronic kidney disease with heart failure and stage 1 through stage 4 chronic kidney disease, or unspecified chronic kidney disease: Secondary | ICD-10-CM | POA: Diagnosis not present

## 2021-05-15 DIAGNOSIS — I272 Pulmonary hypertension, unspecified: Secondary | ICD-10-CM | POA: Diagnosis not present

## 2021-05-18 ENCOUNTER — Ambulatory Visit (INDEPENDENT_AMBULATORY_CARE_PROVIDER_SITE_OTHER): Payer: HMO

## 2021-05-18 DIAGNOSIS — R55 Syncope and collapse: Secondary | ICD-10-CM | POA: Diagnosis not present

## 2021-05-19 DIAGNOSIS — H353211 Exudative age-related macular degeneration, right eye, with active choroidal neovascularization: Secondary | ICD-10-CM | POA: Diagnosis not present

## 2021-05-24 NOTE — Progress Notes (Signed)
Date:  05/25/2021   ID:  William Mueller, DOB [birth year], MRN [id number]  Patient Location:  [address] [place] [place] [address]   Provider location:   [facility], [place] office  PCP:  William Sake, MD  Cardiologist:  William Mueller  Chief Complaint  Patient presents with   2 week follow up     Patient c/o feeling nervous, tremors seem worse lately, has shortness of breath and had chest pain early this afternoon. Medications reviewed by the patient verbally.    History of Present Illness:    William Mueller is a 70 y.o. male past medical history of long history of smoking, COPD,  chronic renal insufficiency,  CR 1.7 CAD  12/11/2013 to [facility] with chest pain cardiac catheterization lab : occluded mid RCA. Stent was placed.  Repeat catheterization in October 2016 with patent stent,disease of a small diagonal and distal circumflex not amenable to intervention.  paroxysmal atrial tachycardia Cardiac catheterization April 2017 He presents today for follow-up of his coronary artery disease, stable angina and COPD   LOV February 2022 with myself  Constipation, chronic issues, followed by GI About to start miralex per GI  Not sleeping well, Waking up after few hours  Lots of tremor Numb hands, feet  Stopped drinking beers Prior heavy ETOH  Linq in place: no arrhythmia, download 05/09/2021  Potassium running low 3.3 one-month ago   Chronic shortness of breath, on oxygen, 2 L Denies any recent COPD exacerbation  BP at home: 130/70  Denies any chest pain concerning for angina  EKG personally reviewed by myself on todays visit NSR rate 100 , baseline artifact from tremor  Seen by EP 07/16/2020 May 07, 2020 concern for a stroke. Seen in the emergency department where imaging confirmed an acute infarct in his inferior left frontal lobe.  recommended to continue aspirin and Plavix indefinitely.   ZIO monitor performed after  the event showed no episodes of atrial fibrillation  loop recorder implant. 07/16/2020  Other past medical history reviewed  cardiac catheterization 09/06/2018  grossly patent vessels with Significant disease of a small diagonal branch and small distal left circumflex branches  other major branches of the RCA, left circumflex, LAD and large diagonal are intact with no significant disease  Mid Cx to Dist Cx lesion is 90% stenosed. Ost 1st Diag to 1st Diag lesion is 80% stenosed. Previously placed Prox RCA to Mid RCA stent (unknown type) is widely patent. Prox RCA lesion is 35% stenosed. The left ventricular ejection fraction is 55-65% by visual estimate. The left ventricular systolic function is normal. LV end diastolic pressure is normal. There is no mitral valve regurgitation. Prox Cx lesion is 30% stenosed.    : Medical management start Ranexa 500 mg twice daily for 1 week then up to 1000 mg twice daily It was felt his chest pain was secondary to small vessel disease  Lab work reviewed   total cholesterol 125 LDL 59 creatinine 1.73 normal electrolytes and LFTs   Past Medical History:  Diagnosis Date   Anginal pain ([place]) 06/2017   Anxiety    Bronchitis 06/2017   Chronic chest pain    Chronic kidney disease (CKD) stage G3b/A1, moderately decreased glomerular filtration rate (GFR) between 30-44 mL/min/1.73 square meter and albuminuria creatinine ratio less than 30 mg/g (HCC)    Chronic lower back pain    WITH LEG WEAKNESS   COPD (chronic obstructive pulmonary disease) ([place])  EMPHYSEMA, O2 AT 2L PRN   Coronary artery disease    a. 12/2013 PCI: mRCA 100% with L to R collats s/p PCI/DES;  b. 06/2014 MV: no ischemia/infarct, EF 53%;  c. 03/2015 Cath: LM nl, LAD nl, D1 80 (1.46mm), LCX 63m (<1.19mm), OM1 nl, RCA 55p, patent stent, RPDA nl, EF 65%; d. 09/2015 Cath: LM nl, LAD 64m, D1 80 (<67mm), LCX 7m/d (<1.73mm), OM1 nl, RCA 55p (FFR 0.93), patent stent, RPDA nl-->Med Rx.   Cryptogenic  stroke ([place]) 05/07/2020   Some right sided weakness and balance issues   Daily headache    Depression    Diastolic dysfunction    a. echo 12/2013: EF 55-60%, mild LVH, GR1DD, inf HK, elevated CVP, mildly dilated IVC suggestive of increased RA pressure   Dyspnea    WHEEZING   GERD (gastroesophageal reflux disease)    REFLUX   Hearing loss    High cholesterol    Hypertension    Myocardial infarction ([place]) 2015   Nicotine addiction    a. using eCigs.   Orthopnea    Sleep apnea    No CPAP   Status post placement of implantable loop recorder 07/16/2020   Vertigo    Past Surgical History:  Procedure Laterality Date   CARDIAC CATHETERIZATION     MC x 1 stent   CARDIAC CATHETERIZATION Left 03/12/2015   Procedure: Left Heart Cath and Coronary Angiography;  Surgeon: Leonie Man, MD;  Location: [place];  Service: Cardiovascular;  Laterality: Left;   CARDIAC CATHETERIZATION N/A 09/29/2015   Procedure: Left Heart Cath and Coronary Angiography;  Surgeon: Wellington Hampshire, MD;  Location: [place] [facility];  Service: Cardiovascular;  Laterality: N/A;   CATARACT EXTRACTION W/PHACO Right 08/10/2017   Procedure: CATARACT EXTRACTION PHACO AND INTRAOCULAR LENS PLACEMENT ([place]) RIGHT;  Surgeon: Leandrew Koyanagi, MD;  Location: [facility];  Service: Ophthalmology;  Laterality: Right;   CATARACT EXTRACTION W/PHACO Left 10/15/2020   Procedure: CATARACT EXTRACTION PHACO AND INTRAOCULAR LENS PLACEMENT ([place]) LEFT;  Surgeon: Leandrew Koyanagi, MD;  Location: [facility];  Service: Ophthalmology;  Laterality: Left;  sleep apnea CDE 15.52 1:55.5 minutes 13.5%   CORONARY ANGIOPLASTY     STENT PLACEMENT   LEFT HEART CATH AND CORONARY ANGIOGRAPHY N/A 09/06/2018   Procedure: LEFT HEART CATH AND CORONARY ANGIOGRAPHY;  Surgeon: Minna Merritts, MD;  Location: [facility];  Service: Cardiovascular;  Laterality: N/A;   LEFT HEART CATHETERIZATION WITH CORONARY ANGIOGRAM  N/A 12/12/2013   Procedure: LEFT HEART CATHETERIZATION WITH CORONARY ANGIOGRAM;  Surgeon: Wellington Hampshire, MD;  Location: [facility];  Service: Cardiovascular;  Laterality: N/A;     Current Outpatient Medications on File Prior to Visit  Medication Sig Dispense Refill   acetaminophen (TYLENOL) 500 MG tablet Take 1,000 mg by mouth daily as needed for headache.      albuterol (VENTOLIN HFA) 108 (90 Base) MCG/ACT inhaler Inhale 2 puffs into the lungs every 6 (six) hours as needed for wheezing or shortness of breath. 8 g 6   ALPRAZolam (XANAX XR) 3 MG 24 hr tablet Take 3 mg by mouth 2 (two) times daily. 1 MG IN THE MORNING AND 3 MG IN THE EVENING     ALPRAZolam (XANAX) 1 MG tablet Take 1 mg by mouth daily.   5   aspirin 81 MG EC tablet Take 1 tablet (81 mg total) by mouth daily. 90 tablet 3   atorvastatin (LIPITOR) 80 MG tablet TAKE 1  TABLET(80 MG) BY MOUTH DAILY 90 tablet 2   clopidogrel (PLAVIX) 75 MG tablet Take 1 tablet (75 mg total) by mouth daily. 90 tablet 3   ezetimibe (ZETIA) 10 MG tablet TAKE 1 TABLET(10 MG) BY MOUTH DAILY 90 tablet 3   Fluticasone-Umeclidin-Vilant (TRELEGY ELLIPTA) 100-62.5-25 MCG/INH AEPB Inhale 1 puff into the lungs daily. 60 each 11   furosemide (LASIX) 20 MG tablet Take 20 mg by mouth daily.      hydrALAZINE (APRESOLINE) 25 MG tablet Take 1 tablet (25 mg total) by mouth 3 (three) times daily as needed. 90 tablet 3   meclizine (ANTIVERT) 12.5 MG tablet Take 1 tablet (12.5 mg total) by mouth 3 (three) times daily as needed for dizziness. 30 tablet 0   metoprolol succinate (TOPROL-XL) 100 MG 24 hr tablet TAKE 1 TABLET(100 MG) BY MOUTH DAILY WITH OR IMMEDIATELY FOLLOWING A MEAL 90 tablet 2   nitroGLYCERIN (NITROSTAT) 0.4 MG SL tablet Place 1 tablet (0.4 mg total) under the tongue every 5 (five) minutes as needed for chest pain. 25 tablet 1   OXYGEN Inhale 2 L into the lungs daily as needed (oxygen).      pantoprazole (PROTONIX) 40 MG tablet Take 40 mg by mouth daily.      potassium chloride (KLOR-CON) 10 MEQ tablet Take 1 tablet (10 mEq total) by mouth daily. 90 tablet 3   roflumilast (DALIRESP) 500 MCG TABS tablet Take 1 tablet (500 mcg total) by mouth daily. 30 tablet 6   predniSONE (DELTASONE) 20 MG tablet Take 1 tablet (20 mg total) by mouth daily with breakfast. (Patient not taking: Reported on 05/25/2021) 5 tablet 0   No current facility-administered medications on file prior to visit.    Allergies:   Paroxetine hcl, Serotonin reuptake inhibitors (ssris), Diazepam, Doxycycline monohydrate, Escitalopram oxalate, Lorazepam, and Tetracyclines & related   Social History   Tobacco Use   Smoking status: Former    Packs/day: 3.00    Years: 48.00    Pack years: 144.00    Types: E-cigarettes, Cigarettes    Quit date: 06/17/2009    Years since quitting: 11.9   Smokeless tobacco: Never   Tobacco comments:    quit e-cigs 03/2020  Vaping Use   Vaping Use: Former   Quit date: 04/15/2020  Substance Use Topics   Alcohol use: Yes    Alcohol/week: 6.0 standard drinks    Types: 6 Cans of beer per week    Comment: Occasional    Drug use: No     Family Hx: The patient's family history includes CVA in his mother; Coronary artery disease in his brother and father; Heart disease in his father; Heart disease (age of onset: 29) in his brother. There is no history of Kidney disease or Prostate cancer.  ROS:   Please see the history of present illness.    Review of Systems  Constitutional: Negative.   Respiratory:  Positive for shortness of breath.   Cardiovascular: Negative.   Gastrointestinal: Negative.   Musculoskeletal: Negative.   Neurological: Negative.   Psychiatric/Behavioral: Negative.    All other systems reviewed and are negative.   Labs/Other Tests and Data Reviewed:    Recent Labs: 12/14/2020: BUN 17; Creatinine, Ser 1.80; Hemoglobin 13.8; Platelets 225; Potassium 3.3; Sodium 134   Recent Lipid Panel Lab Results  Component Value Date/Time    CHOL 98 05/08/2020 04:35 AM   CHOL 114 07/23/2019 11:55 AM   TRIG 79 05/08/2020 04:35 AM   HDL 31 (L) 05/08/2020 04:35  AM   HDL 38 (L) 07/23/2019 11:55 AM   CHOLHDL 3.2 05/08/2020 04:35 AM   LDLCALC 51 05/08/2020 04:35 AM   LDLCALC 53 07/23/2019 11:55 AM    Wt Readings from Last 3 Encounters:  05/25/21 193 lb 2 oz (87.6 kg)  05/08/21 193 lb (87.5 kg)  03/24/21 191 lb 9.6 oz (86.9 kg)     Exam:    BP (!) 158/80 (BP Location: Left Arm, Patient Position: Sitting, Cuff Size: Normal)    Pulse (!) 105    Ht 5\' 9"  (1.753 m)    Wt 193 lb 2 oz (87.6 kg)    SpO2 97% Comment: oxygen @ 2 Liters   BMI 28.52 kg/m  Constitutional:  oriented to person, place, and time. No distress.  HENT:  Head: Grossly normal Eyes:  no discharge. No scleral icterus.  Neck: No JVD, no carotid bruits  Cardiovascular: Regular rate and rhythm, no murmurs appreciated Pulmonary/Chest: Clear to auscultation bilaterally, no wheezes or rails Abdominal: Soft.  no distension.  no tenderness.  Musculoskeletal: Normal range of motion Neurological:  normal muscle tone. Coordination normal. No atrophy Skin: Skin warm and dry Psychiatric: normal affect, pleasant  ASSESSMENT & PLAN:    Atherosclerosis of native coronary artery of native heart with unstable angina pectoris (HCC) Prior catheterization last year, medical management recommended Cholesterol at goal, non-smoker  Atrial tachycardia, paroxysmal (HCC) No arrhythmia on loop download Continue metoprolol  Stroke documented on CT scan On aspirin Plavix, cholesterol at goal No arrhythmia on loop  CKD (chronic kidney disease) stage 3, GFR 30-59 ml/min (HCC) Chronic kidney disease creatinine 1.8, Slow trend upwards over the past 2 years but relatively stable Recommend he take potassium with his Lasix  Centrilobular emphysema (HCC) On chronic oxygen, Stopped smoking Not very active, limited by COPD  Tremor Prior history of alcohol, may be contributing to  tremor Deconditioned, poor diet   Total encounter time more than 25 minutes  Greater than 50% was spent in counseling and coordination of care with the patient    Signed, Ida Rogue, MD  05/25/2021 3:01 PM    [place] [facility] [address], [place], [place] [address]

## 2021-05-25 ENCOUNTER — Other Ambulatory Visit: Payer: Self-pay

## 2021-05-25 ENCOUNTER — Ambulatory Visit (INDEPENDENT_AMBULATORY_CARE_PROVIDER_SITE_OTHER): Payer: HMO | Admitting: Cardiovascular Disease

## 2021-05-25 ENCOUNTER — Encounter: Payer: Self-pay | Admitting: Cardiovascular Disease

## 2021-05-25 VITALS — BP 158/80 | HR 105 | Ht 69.0 in | Wt 193.1 lb

## 2021-05-25 DIAGNOSIS — I471 Supraventricular tachycardia: Secondary | ICD-10-CM | POA: Diagnosis not present

## 2021-05-25 DIAGNOSIS — J449 Chronic obstructive pulmonary disease, unspecified: Secondary | ICD-10-CM | POA: Diagnosis not present

## 2021-05-25 DIAGNOSIS — I2510 Atherosclerotic heart disease of native coronary artery without angina pectoris: Secondary | ICD-10-CM | POA: Diagnosis not present

## 2021-05-25 DIAGNOSIS — I639 Cerebral infarction, unspecified: Secondary | ICD-10-CM | POA: Diagnosis not present

## 2021-05-25 DIAGNOSIS — I10 Essential (primary) hypertension: Secondary | ICD-10-CM

## 2021-05-25 DIAGNOSIS — N183 Chronic kidney disease, stage 3 unspecified: Secondary | ICD-10-CM

## 2021-05-25 MED ORDER — POTASSIUM CHLORIDE ER 20 MEQ PO TBCR: 20.0000 meq | EXTENDED_RELEASE_TABLET | Freq: Every day | ORAL | 3 refills | Status: DC

## 2021-05-25 NOTE — Patient Instructions (Addendum)
Medication Instructions:  Please start  potassium 20 meq daily (refilled)  For constipation Try citracel and miralex, couple spoonfuls , ice and water Think about probiotics  Also add OTC vitamins  Vit D, B12, multi  If you need a refill on your cardiac medications before your next appointment, please call your pharmacy.   Lab work: No new labs needed  Testing/Procedures: No new testing needed  Follow-Up: At [facility], you and your health needs are our priority.  As part of our continuing mission to provide you with exceptional heart care, we have created designated Provider Care Teams.  These Care Teams include your primary Cardiologist (physician) and Advanced Practice Providers (APPs -  Physician Assistants and Nurse Practitioners) who all work together to provide you with the care you need, when you need it.  You will need a follow up appointment in 6 months  Providers on your designated Care Team:   Murray Hodgkins, NP Christell Faith, PA-C Cadence Kathlen Mody, [place]  COVID-19 Vaccine Information can be found at: [email or web address].co.uk For questions related to vaccine distribution or appointments, please email vaccine@[email or web address] .[email or web address] or call [phone].

## 2021-05-27 NOTE — Progress Notes (Signed)
Carelink Summary Report / Loop Recorder 

## 2021-06-11 ENCOUNTER — Other Ambulatory Visit: Payer: Self-pay | Admitting: Cardiovascular Disease

## 2021-06-11 DIAGNOSIS — E785 Hyperlipidemia, unspecified: Secondary | ICD-10-CM

## 2021-06-22 ENCOUNTER — Ambulatory Visit (INDEPENDENT_AMBULATORY_CARE_PROVIDER_SITE_OTHER): Payer: HMO

## 2021-06-22 DIAGNOSIS — G459 Transient cerebral ischemic attack, unspecified: Secondary | ICD-10-CM

## 2021-06-22 LAB — CUP PACEART REMOTE DEVICE CHECK
Date Time Interrogation Session: 20230115230602
Implantable Pulse Generator Implant Date: 20220209

## 2021-06-23 ENCOUNTER — Telehealth: Payer: Self-pay | Admitting: Pulmonary Disease

## 2021-06-23 NOTE — Telephone Encounter (Signed)
Received [employer] tier exception from [facility]. Forms have been placed in Dr. Domingo Dimes folder for signature.

## 2021-06-30 ENCOUNTER — Ambulatory Visit: Payer: HMO | Admitting: Pulmonary Disease

## 2021-06-30 NOTE — Telephone Encounter (Signed)
Tier exception completed and faxed back to [facility].  Received successful fax confirmation.

## 2021-07-01 NOTE — Telephone Encounter (Signed)
Spoke to [place] with [facility]. Lennette Bihari stated that he received tier exception, however [employer] requires a PA.  Gave verbal to [place] to start PA.  Nothing further needed at this time.

## 2021-07-02 NOTE — Progress Notes (Signed)
Carelink Summary Report / Loop Recorder 

## 2021-07-15 ENCOUNTER — Telehealth: Payer: Self-pay | Admitting: Pulmonary Disease

## 2021-07-15 MED ORDER — ALBUTEROL SULFATE HFA 108 (90 BASE) MCG/ACT IN AERS: 2.0000 | INHALATION_SPRAY | Freq: Four times a day (QID) | RESPIRATORY_TRACT | 6 refills | Status: DC | PRN

## 2021-07-15 NOTE — Telephone Encounter (Signed)
Albuterol HFA sent to preferred pharmacy.  Patient's spouse, Gloria(DPR) is aware and voiced her understanding.  Nothing further needed.

## 2021-07-25 LAB — CUP PACEART REMOTE DEVICE CHECK
Date Time Interrogation Session: 20230217230258
Implantable Pulse Generator Implant Date: 20220209

## 2021-07-27 ENCOUNTER — Ambulatory Visit (INDEPENDENT_AMBULATORY_CARE_PROVIDER_SITE_OTHER): Payer: HMO

## 2021-07-27 DIAGNOSIS — G459 Transient cerebral ischemic attack, unspecified: Secondary | ICD-10-CM | POA: Diagnosis not present

## 2021-07-31 NOTE — Progress Notes (Signed)
Carelink Summary Report / Loop Recorder 

## 2021-08-10 ENCOUNTER — Telehealth: Payer: Self-pay | Admitting: Pulmonary Disease

## 2021-08-10 MED ORDER — TRELEGY ELLIPTA 100-62.5-25 MCG/ACT IN AEPB: 1.0000 | INHALATION_SPRAY | Freq: Every day | RESPIRATORY_TRACT | 5 refills | Status: DC

## 2021-08-10 NOTE — Telephone Encounter (Signed)
Trelegy sent to preferred pharmacy.  ?Patient's spouse, Gloria(DPR) is aware and voiced her understanding.  ?Nothing further needed.  ? ?

## 2021-08-31 ENCOUNTER — Ambulatory Visit (INDEPENDENT_AMBULATORY_CARE_PROVIDER_SITE_OTHER): Payer: HMO

## 2021-08-31 DIAGNOSIS — G459 Transient cerebral ischemic attack, unspecified: Secondary | ICD-10-CM

## 2021-09-01 LAB — CUP PACEART REMOTE DEVICE CHECK
Date Time Interrogation Session: 20230324230801
Implantable Pulse Generator Implant Date: 20220209

## 2021-09-10 NOTE — Progress Notes (Signed)
Carelink Summary Report / Loop Recorder 

## 2021-10-01 LAB — CUP PACEART REMOTE DEVICE CHECK
Date Time Interrogation Session: 20230426230807
Implantable Pulse Generator Implant Date: 20220209

## 2021-10-05 ENCOUNTER — Ambulatory Visit (INDEPENDENT_AMBULATORY_CARE_PROVIDER_SITE_OTHER): Payer: HMO

## 2021-10-05 DIAGNOSIS — G459 Transient cerebral ischemic attack, unspecified: Secondary | ICD-10-CM

## 2021-10-20 NOTE — Progress Notes (Signed)
Carelink Summary Report / Loop Recorder 

## 2021-11-09 ENCOUNTER — Ambulatory Visit (INDEPENDENT_AMBULATORY_CARE_PROVIDER_SITE_OTHER): Payer: HMO

## 2021-11-09 DIAGNOSIS — I639 Cerebral infarction, unspecified: Secondary | ICD-10-CM | POA: Diagnosis not present

## 2021-11-09 LAB — CUP PACEART REMOTE DEVICE CHECK
Date Time Interrogation Session: 20230602230054
Implantable Pulse Generator Implant Date: 20220209

## 2021-11-17 ENCOUNTER — Encounter: Payer: Self-pay | Admitting: *Deleted

## 2021-11-22 NOTE — Progress Notes (Unsigned)
Date:  11/23/2021   ID:  William Mueller, DOB 01/22/1951, MRN [id number]  Patient Location:  [place] [id number]   Provider location:   [facility], [place] office  PCP:  Leonides Sake, MD  Cardiologist:  Patsy Baltimore  Chief Complaint  Patient presents with   6 month follow up     Patient c/o chest pain and shortness of breath. Medications reviewed by the patient verbally.     History of Present Illness:    William Mueller is a 71 y.o. male past medical history of long history of smoking, COPD,  chronic renal insufficiency,  CR 1.7 CAD Admission 12/11/2013 to [facility] with chest pain cardiac catheterization lab : occluded mid RCA. Stent was placed.  catheterization in October 2016 with patent stent,disease of a small diagonal and distal circumflex not amenable to intervention.  paroxysmal atrial tachycardia Cardiac catheterization April 2017 History of alcohol abuse Catheterization April 2020, chest pain felt secondary to small vessel disease loop recorder implant. 07/16/2020, out of concern for stroke, rule out A-fib He presents today for follow-up of his coronary artery disease, stable angina and COPD   LOV with myself December 2022  Loop monitor downloads reviewed, no significant arrhythmia  Out of metoprolol Heart rate elevated with walking in house  Does not use oxygen much at night or in the house walking On alprazolam 2 mg in the Am, less anxiety  No recent chest pain Using rescue inhaler more Denies sputum production  Constipation, chronic issues, refuses to use MiraLAX Previously seen by GI  Chronic shortness of breath, on oxygen, 2 L Denies any recent COPD exacerbation  No chest pain concerning for angina  Previously reported broken sleep Last tremor noted today On last clinic visit reported that he stopped drinking beers Prior heavy ETOH  Linq in place: no arrhythmia, download 05/09/2021  EKG  personally reviewed by myself on todays visit Normal sinus rhythm with T wave abnormality V4 through V6, unchanged from prior EKG  Other past medical history reviewed Seen by EP 07/16/2020 May 07, 2020 concern for a stroke. Seen in the emergency department where imaging confirmed an acute infarct in his inferior left frontal lobe.  recommended to continue aspirin and Plavix indefinitely.   ZIO monitor performed after the event showed no episodes of atrial fibrillation   cardiac catheterization 09/06/2018  grossly patent vessels with Significant disease of a small diagonal branch and small distal left circumflex branches  other major branches of the RCA, left circumflex, LAD and large diagonal are intact with no significant disease  Mid Cx to Dist Cx lesion is 90% stenosed. Ost 1st Diag to 1st Diag lesion is 80% stenosed. Previously placed Prox RCA to Mid RCA stent (unknown type) is widely patent. Prox RCA lesion is 35% stenosed. The left ventricular ejection fraction is 55-65% by visual estimate. The left ventricular systolic function is normal. LV end diastolic pressure is normal. There is no mitral valve regurgitation. Prox Cx lesion is 30% stenosed.    : Medical management start Ranexa 500 mg twice daily for 1 week then up to 1000 mg twice daily It was felt his chest pain was secondary to small vessel disease  Lab work reviewed   total cholesterol 125 LDL 59 creatinine 1.73 normal electrolytes and LFTs   Past Medical History:  Diagnosis Date   Anginal pain ([place]) 06/2017   Anxiety    Bronchitis 06/2017  Chronic chest pain    Chronic kidney disease (CKD) stage G3b/A1, moderately decreased glomerular filtration rate (GFR) between 30-44 mL/min/1.73 square meter and albuminuria creatinine ratio less than 30 mg/g (HCC)    Chronic lower back pain    WITH LEG WEAKNESS   COPD (chronic obstructive pulmonary disease) (HCC)    EMPHYSEMA, O2 AT 2L PRN   Coronary artery disease     a. 12/2013 PCI: mRCA 100% with L to R collats s/p PCI/DES;  b. 06/2014 MV: no ischemia/infarct, EF 53%;  c. 03/2015 Cath: LM nl, LAD nl, D1 80 (1.56m), LCX 949m<1.39m82m OM1 nl, RCA 55p, patent stent, RPDA nl, EF 65%; d. 09/2015 Cath: LM nl, LAD 49m73m 80 (<2mm)81mCX 22m/d7m.39mm), 43m nl, RCA 55p (FFR 0.93), patent stent, RPDA nl-->Med Rx.   Cryptogenic stroke (HCC) [place]/2021   Some right sided weakness and balance issues   Daily headache    Depression    Diastolic dysfunction    a. echo 12/2013: EF 55-60%, mild LVH, GR1DD, inf HK, elevated CVP, mildly dilated IVC suggestive of increased RA pressure   Dyspnea    WHEEZING   GERD (gastroesophageal reflux disease)    REFLUX   Hearing loss    High cholesterol    Hypertension    Myocardial infarction (HCC) [place]  Nicotine addiction    a. using eCigs.   Orthopnea    Sleep apnea    No CPAP   Status post placement of implantable loop recorder 07/16/2020   Vertigo    Past Surgical History:  Procedure Laterality Date   CARDIAC CATHETERIZATION     MC x 1 stent   CARDIAC CATHETERIZATION Left 03/12/2015   Procedure: Left Heart Cath and Coronary Angiography;  Surgeon: David WLeonie ManLocation[facility];  Service: Cardiovascular;  Laterality: Left;   CARDIAC CATHETERIZATION N/A 09/29/2015   Procedure: Left Heart Cath and Coronary Angiography;  Surgeon: MuhammaWellington HampshireLocation[facility];  Service: Cardiovascular;  Laterality: N/A;   CATARACT EXTRACTION W/PHACO Right 08/10/2017   Procedure: CATARACT EXTRACTION PHACO AND INTRAOCULAR LENS PLACEMENT (IOC) [place];  Surgeon: BrasingLeandrew KoyanagiLocation: [place]: Ophthalmology;  Laterality: Right;   CATARACT EXTRACTION W/PHACO Left 10/15/2020   Procedure: CATARACT EXTRACTION PHACO AND INTRAOCULAR LENS PLACEMENT (IOC) [place]  Surgeon: BrasingLeandrew KoyanagiLocation: [place]: Ophthalmology;  Laterality: Left;  sleep  apnea CDE 15.52 1:55.5 minutes 13.5%   CORONARY ANGIOPLASTY     STENT PLACEMENT   LEFT HEART CATH AND CORONARY ANGIOGRAPHY N/A 09/06/2018   Procedure: LEFT HEART CATH AND CORONARY ANGIOGRAPHY;  Surgeon: Felicita Nuncio,Minna MerrittsLocation: [facility];  Service: Cardiovascular;  Laterality: N/A;   LEFT HEART CATHETERIZATION WITH CORONARY ANGIOGRAM N/A 12/12/2013   Procedure: LEFT HEART CATHETERIZATION WITH CORONARY ANGIOGRAM;  Surgeon: MuhammaWellington HampshireLocation[facility] [facility];  Service: Cardiovascular;  Laterality: N/A;     Current Outpatient Medications on File Prior to Visit  Medication Sig Dispense Refill   acetaminophen (TYLENOL) 500 MG tablet Take 1,000 mg by mouth daily as needed for headache.      albuterol (VENTOLIN HFA) 108 (90 Base) MCG/ACT inhaler Inhale 2 puffs into the lungs every 6 (six) hours as needed for wheezing or shortness of breath. 8 g 6   ALPRAZolam (XANAX) 1 MG tablet Take 2 mg by mouth daily.  5   aspirin 81 MG  EC tablet Take 1 tablet (81 mg total) by mouth daily. 90 tablet 3   atorvastatin (LIPITOR) 80 MG tablet Take 1 tablet by mouth once daily 90 tablet 1   clopidogrel (PLAVIX) 75 MG tablet Take 1 tablet (75 mg total) by mouth daily. 90 tablet 3   ezetimibe (ZETIA) 10 MG tablet TAKE 1 TABLET(10 MG) BY MOUTH DAILY 90 tablet 3   Fluticasone-Umeclidin-Vilant (TRELEGY ELLIPTA) 100-62.5-25 MCG/ACT AEPB Inhale 1 puff into the lungs daily. 60 each 5   furosemide (LASIX) 20 MG tablet Take 20 mg by mouth daily.      hydrALAZINE (APRESOLINE) 25 MG tablet Take 1 tablet (25 mg total) by mouth 3 (three) times daily as needed. 90 tablet 3   meclizine (ANTIVERT) 12.5 MG tablet Take 1 tablet (12.5 mg total) by mouth 3 (three) times daily as needed for dizziness. 30 tablet 0   metoprolol succinate (TOPROL-XL) 100 MG 24 hr tablet TAKE 1 TABLET(100 MG) BY MOUTH DAILY WITH OR IMMEDIATELY FOLLOWING A MEAL 90 tablet 2   nitroGLYCERIN (NITROSTAT) 0.4 MG SL tablet Place 1 tablet  (0.4 mg total) under the tongue every 5 (five) minutes as needed for chest pain. 25 tablet 1   OXYGEN Inhale 2 L into the lungs daily as needed (oxygen).      pantoprazole (PROTONIX) 40 MG tablet Take 40 mg by mouth daily.     potassium chloride 20 MEQ TBCR Take 20 mEq by mouth daily. 90 tablet 3   roflumilast (DALIRESP) 500 MCG TABS tablet Take 1 tablet (500 mcg total) by mouth daily. 30 tablet 6   ALPRAZolam (XANAX XR) 3 MG 24 hr tablet Take 3 mg by mouth 2 (two) times daily. 1 MG IN THE MORNING AND 3 MG IN THE EVENING (Patient not taking: Reported on 11/23/2021)     Fluticasone-Umeclidin-Vilant (TRELEGY ELLIPTA) 100-62.5-25 MCG/INH AEPB Inhale 1 puff into the lungs daily. (Patient not taking: Reported on 11/23/2021) 60 each 11   predniSONE (DELTASONE) 20 MG tablet Take 1 tablet (20 mg total) by mouth daily with breakfast. (Patient not taking: Reported on 05/25/2021) 5 tablet 0   No current facility-administered medications on file prior to visit.    Allergies:   Paroxetine hcl, Serotonin reuptake inhibitors (ssris), Diazepam, Doxycycline monohydrate, Escitalopram oxalate, Lorazepam, and Tetracyclines & related   Social History   Tobacco Use   Smoking status: Former    Packs/day: 3.00    Years: 48.00    Total pack years: 144.00    Types: E-cigarettes, Cigarettes    Quit date: 06/17/2009    Years since quitting: 12.4   Smokeless tobacco: Never   Tobacco comments:    quit e-cigs 03/2020  Vaping Use   Vaping Use: Former   Quit date: 04/15/2020  Substance Use Topics   Alcohol use: Yes    Alcohol/week: 6.0 standard drinks of alcohol    Types: 6 Cans of beer per week    Comment: Occasional    Drug use: No     Family Hx: The patient's family history includes CVA in his mother; Coronary artery disease in his brother and father; Heart disease in his father; Heart disease (age of onset: 55) in his brother. There is no history of Kidney disease or Prostate cancer.  ROS:   Please see the  history of present illness.    Review of Systems  Constitutional: Negative.   Respiratory:  Positive for shortness of breath.   Cardiovascular: Negative.   Gastrointestinal: Negative.   Musculoskeletal:  Negative.   Neurological: Negative.   Psychiatric/Behavioral: Negative.    All other systems reviewed and are negative.    Labs/Other Tests and Data Reviewed:    Recent Labs: 12/14/2020: BUN 17; Creatinine, Ser 1.80; Hemoglobin 13.8; Platelets 225; Potassium 3.3; Sodium 134   Recent Lipid Panel Lab Results  Component Value Date/Time   CHOL 98 05/08/2020 04:35 AM   CHOL 114 07/23/2019 11:55 AM   TRIG 79 05/08/2020 04:35 AM   HDL 31 (L) 05/08/2020 04:35 AM   HDL 38 (L) 07/23/2019 11:55 AM   CHOLHDL 3.2 05/08/2020 04:35 AM   LDLCALC 51 05/08/2020 04:35 AM   LDLCALC 53 07/23/2019 11:55 AM    Wt Readings from Last 3 Encounters:  11/23/21 192 lb 6 oz (87.3 kg)  05/25/21 193 lb 2 oz (87.6 kg)  05/08/21 193 lb (87.5 kg)     Exam:    BP 140/90 (BP Location: Left Arm, Patient Position: Sitting, Cuff Size: Normal)   Pulse 77   Ht '5\' 9"'$  (1.753 m)   Wt 192 lb 6 oz (87.3 kg)   SpO2 96% Comment: 2 Liters of oxygen  BMI 28.41 kg/m  Constitutional:  oriented to person, place, and time. No distress.  HENT:  Head: Grossly normal Eyes:  no discharge. No scleral icterus.  Neck: No JVD, no carotid bruits  Cardiovascular: Regular rate and rhythm, no murmurs appreciated Pulmonary/Chest: Moderately decreased breath sounds throughout Abdominal: Soft.  no distension.  no tenderness.  Musculoskeletal: Normal range of motion Neurological:  normal muscle tone. Coordination normal. No atrophy Skin: Skin warm and dry Psychiatric: normal affect, pleasant  ASSESSMENT & PLAN:    Atherosclerosis of native coronary artery of native heart with unstable angina pectoris [facility]) Prior catheterization 2020, medical management recommended, likely small vessel disease Denies anginal symptoms No  further changes to his medications  Atrial tachycardia, paroxysmal (HCC) No arrhythmia on loop download Continue metoprolol succinate 100 in the morning He does not feel that he needs additional metoprolol in the evening  Stroke documented on CT scan On aspirin Plavix, cholesterol at goal No arrhythmia on loop, downloads reviewed  CKD (chronic kidney disease) stage 3, GFR 30-59 ml/min (HCC) Lab work from primary care requested Known chronic kidney disease, previous numbers creatinine 1.8, Remains on Lasix and potassium daily, no edema  Centrilobular emphysema (HCC) On chronic oxygen, Stopped smoking Not very active, limited by COPD  Tremor Prior history of alcohol, may be contributing to tremor Deconditioned, poor diet Reports that he stopped drinking   Total encounter time more than 30 minutes  Greater than 50% was spent in counseling and coordination of care with the patient    Signed, Ida Rogue, MD  11/23/2021 2:38 PM    [id number], [place], [place] [address]

## 2021-11-23 ENCOUNTER — Other Ambulatory Visit: Payer: Self-pay | Admitting: Family

## 2021-11-23 ENCOUNTER — Ambulatory Visit (INDEPENDENT_AMBULATORY_CARE_PROVIDER_SITE_OTHER): Payer: HMO | Admitting: Cardiovascular Disease

## 2021-11-23 ENCOUNTER — Encounter: Payer: Self-pay | Admitting: Cardiovascular Disease

## 2021-11-23 VITALS — BP 140/90 | HR 77 | Ht 69.0 in | Wt 192.4 lb

## 2021-11-23 DIAGNOSIS — I25118 Atherosclerotic heart disease of native coronary artery with other forms of angina pectoris: Secondary | ICD-10-CM

## 2021-11-23 DIAGNOSIS — I471 Supraventricular tachycardia: Secondary | ICD-10-CM

## 2021-11-23 DIAGNOSIS — I2510 Atherosclerotic heart disease of native coronary artery without angina pectoris: Secondary | ICD-10-CM | POA: Diagnosis not present

## 2021-11-23 DIAGNOSIS — N183 Chronic kidney disease, stage 3 unspecified: Secondary | ICD-10-CM

## 2021-11-23 DIAGNOSIS — I10 Essential (primary) hypertension: Secondary | ICD-10-CM

## 2021-11-23 DIAGNOSIS — R55 Syncope and collapse: Secondary | ICD-10-CM

## 2021-11-23 DIAGNOSIS — J449 Chronic obstructive pulmonary disease, unspecified: Secondary | ICD-10-CM

## 2021-11-23 DIAGNOSIS — E785 Hyperlipidemia, unspecified: Secondary | ICD-10-CM

## 2021-11-23 DIAGNOSIS — I639 Cerebral infarction, unspecified: Secondary | ICD-10-CM

## 2021-11-23 NOTE — Patient Instructions (Addendum)
Labs from PMD  Would make appt with Claudette Stapler, MD [place], [place], [place] [address] [phone]  Medication Instructions:  No changes  If you need a refill on your cardiac medications before your next appointment, please call your pharmacy.   Lab work: No new labs needed  Testing/Procedures: No new testing needed  Follow-Up: At [facility], you and your health needs are our priority.  As part of our continuing mission to provide you with exceptional heart care, we have created designated Provider Care Teams.  These Care Teams include your primary Cardiologist (physician) and Advanced Practice Providers (APPs -  Physician Assistants and Nurse Practitioners) who all work together to provide you with the care you need, when you need it.  You will need a follow up appointment in 12 months  Providers on your designated Care Team:   Murray Hodgkins, NP Christell Faith, PA-C Cadence Kathlen Mody, [place]  COVID-19 Vaccine Information can be found at: [email or web address].uk For questions related to vaccine distribution or appointments, please email [email or web address] or call [phone].

## 2021-11-24 NOTE — Telephone Encounter (Signed)
Pt of Dr. Gollan. Please review for refill. Thank you! 

## 2021-11-26 ENCOUNTER — Telehealth: Payer: Self-pay | Admitting: Cardiovascular Disease

## 2021-11-26 DIAGNOSIS — J432 Centrilobular emphysema: Secondary | ICD-10-CM

## 2021-11-26 NOTE — Telephone Encounter (Signed)
Per the patient's AVS from his office visit on 11/23/21 with Dr. Rockey Situ: Would make appt with William Stapler, MD [address] [place], [place] [address] [phone]  The patient is now stating he needs a referral.  To Dr. Rockey Situ to confirm diagnosis for this referral is COPD.

## 2021-11-26 NOTE — Telephone Encounter (Signed)
Minna Merritts, MD  Sent: Thu November 26, 2021  4:47 PM  To: Emily Filbert, RN; Mila Merry, RN          Message  Yes he has diagnosis of COPD and we can place referral if that is what he needs  Thx  TG

## 2021-11-26 NOTE — Telephone Encounter (Signed)
Patient's wife called stating they need a referral for patient to go to pulmonary as recommend per Dr. Rockey Situ.

## 2021-11-26 NOTE — Progress Notes (Signed)
Carelink Summary Report / Loop Recorder 

## 2021-11-27 NOTE — Telephone Encounter (Signed)
Order placed for referral.   Called patient to inform him of same.

## 2021-12-03 ENCOUNTER — Telehealth: Payer: Self-pay | Admitting: Cardiovascular Disease

## 2021-12-03 NOTE — Telephone Encounter (Signed)
Wife call to say that the Pulmonary office didn't get the referral. Please advise

## 2021-12-04 NOTE — Telephone Encounter (Signed)
Faxed and confirmed transmission 11-27-21 .

## 2021-12-04 NOTE — Telephone Encounter (Signed)
Called [facility] and given new fax.    Wife aware and will fu with them on Monday

## 2021-12-12 ENCOUNTER — Other Ambulatory Visit: Payer: Self-pay | Admitting: Cardiovascular Disease

## 2021-12-12 DIAGNOSIS — E785 Hyperlipidemia, unspecified: Secondary | ICD-10-CM

## 2021-12-13 LAB — CUP PACEART REMOTE DEVICE CHECK
Date Time Interrogation Session: 20230705230919
Implantable Pulse Generator Implant Date: 20220209

## 2021-12-14 ENCOUNTER — Ambulatory Visit (INDEPENDENT_AMBULATORY_CARE_PROVIDER_SITE_OTHER): Payer: HMO

## 2021-12-14 DIAGNOSIS — I639 Cerebral infarction, unspecified: Secondary | ICD-10-CM

## 2021-12-24 ENCOUNTER — Other Ambulatory Visit: Payer: Self-pay | Admitting: Pulmonary Disease

## 2021-12-24 DIAGNOSIS — R911 Solitary pulmonary nodule: Secondary | ICD-10-CM

## 2021-12-30 ENCOUNTER — Ambulatory Visit
Admission: RE | Admit: 2021-12-30 | Discharge: 2021-12-30 | Disposition: A | Discharge status: Home / Self Care | Payer: PPO | Source: Ambulatory Visit | Attending: Pulmonary Disease | Admitting: Pulmonary Disease

## 2021-12-30 DIAGNOSIS — R911 Solitary pulmonary nodule: Secondary | ICD-10-CM

## 2022-01-13 NOTE — Progress Notes (Signed)
Carelink Summary Report / Loop Recorder 

## 2022-01-14 LAB — CUP PACEART REMOTE DEVICE CHECK
Date Time Interrogation Session: 20230807230710
Implantable Pulse Generator Implant Date: 20220209

## 2022-01-18 ENCOUNTER — Other Ambulatory Visit: Payer: Self-pay | Admitting: Cardiovascular Disease

## 2022-01-18 ENCOUNTER — Ambulatory Visit: Payer: HMO

## 2022-01-22 ENCOUNTER — Other Ambulatory Visit: Payer: Self-pay | Admitting: Cardiovascular Disease

## 2022-01-22 DIAGNOSIS — E785 Hyperlipidemia, unspecified: Secondary | ICD-10-CM

## 2022-01-29 ENCOUNTER — Observation Stay
Admission: EM | Admit: 2022-01-29 | Discharge: 2022-01-30 | Disposition: A | Discharge status: Home / Self Care | Payer: HMO | Attending: Osteopathic Medicine | Admitting: Osteopathic Medicine

## 2022-01-29 ENCOUNTER — Emergency Department: Payer: HMO

## 2022-01-29 ENCOUNTER — Other Ambulatory Visit: Payer: Self-pay

## 2022-01-29 DIAGNOSIS — Z7902 Long term (current) use of antithrombotics/antiplatelets: Secondary | ICD-10-CM | POA: Insufficient documentation

## 2022-01-29 DIAGNOSIS — J449 Chronic obstructive pulmonary disease, unspecified: Secondary | ICD-10-CM | POA: Insufficient documentation

## 2022-01-29 DIAGNOSIS — Z8673 Personal history of transient ischemic attack (TIA), and cerebral infarction without residual deficits: Secondary | ICD-10-CM | POA: Insufficient documentation

## 2022-01-29 DIAGNOSIS — Z955 Presence of coronary angioplasty implant and graft: Secondary | ICD-10-CM | POA: Insufficient documentation

## 2022-01-29 DIAGNOSIS — R0602 Shortness of breath: Secondary | ICD-10-CM | POA: Insufficient documentation

## 2022-01-29 DIAGNOSIS — R079 Chest pain, unspecified: Secondary | ICD-10-CM | POA: Diagnosis present

## 2022-01-29 DIAGNOSIS — Z7982 Long term (current) use of aspirin: Secondary | ICD-10-CM | POA: Diagnosis not present

## 2022-01-29 DIAGNOSIS — I25110 Atherosclerotic heart disease of native coronary artery with unstable angina pectoris: Secondary | ICD-10-CM | POA: Diagnosis not present

## 2022-01-29 DIAGNOSIS — I200 Unstable angina: Secondary | ICD-10-CM

## 2022-01-29 DIAGNOSIS — Z87891 Personal history of nicotine dependence: Secondary | ICD-10-CM | POA: Insufficient documentation

## 2022-01-29 DIAGNOSIS — I509 Heart failure, unspecified: Secondary | ICD-10-CM | POA: Insufficient documentation

## 2022-01-29 DIAGNOSIS — Z79899 Other long term (current) drug therapy: Secondary | ICD-10-CM | POA: Diagnosis not present

## 2022-01-29 DIAGNOSIS — I130 Hypertensive heart and chronic kidney disease with heart failure and stage 1 through stage 4 chronic kidney disease, or unspecified chronic kidney disease: Secondary | ICD-10-CM | POA: Insufficient documentation

## 2022-01-29 DIAGNOSIS — R072 Precordial pain: Secondary | ICD-10-CM | POA: Diagnosis present

## 2022-01-29 DIAGNOSIS — I272 Pulmonary hypertension, unspecified: Secondary | ICD-10-CM | POA: Diagnosis not present

## 2022-01-29 DIAGNOSIS — I10 Essential (primary) hypertension: Secondary | ICD-10-CM | POA: Diagnosis present

## 2022-01-29 DIAGNOSIS — K219 Gastro-esophageal reflux disease without esophagitis: Secondary | ICD-10-CM

## 2022-01-29 DIAGNOSIS — F419 Anxiety disorder, unspecified: Secondary | ICD-10-CM

## 2022-01-29 DIAGNOSIS — N1832 Chronic kidney disease, stage 3b: Secondary | ICD-10-CM | POA: Diagnosis not present

## 2022-01-29 DIAGNOSIS — I2510 Atherosclerotic heart disease of native coronary artery without angina pectoris: Secondary | ICD-10-CM

## 2022-01-29 LAB — CBC
HCT: 40.5 % (ref 39.0–52.0)
Hemoglobin: 12.6 g/dL — ABNORMAL LOW (ref 13.0–17.0)
MCH: 26.9 pg (ref 26.0–34.0)
MCHC: 31.1 g/dL (ref 30.0–36.0)
MCV: 86.4 fL (ref 80.0–100.0)
Platelets: 285 10*3/uL (ref 150–400)
RBC: 4.69 MIL/uL (ref 4.22–5.81)
RDW: 13.1 % (ref 11.5–15.5)
WBC: 11.5 10*3/uL — ABNORMAL HIGH (ref 4.0–10.5)
nRBC: 0 % (ref 0.0–0.2)

## 2022-01-29 LAB — BASIC METABOLIC PANEL
Anion gap: 8 (ref 5–15)
BUN: 25 mg/dL — ABNORMAL HIGH (ref 8–23)
CO2: 31 mmol/L (ref 22–32)
Calcium: 8.8 mg/dL — ABNORMAL LOW (ref 8.9–10.3)
Chloride: 102 mmol/L (ref 98–111)
Creatinine, Ser: 1.84 mg/dL — ABNORMAL HIGH (ref 0.61–1.24)
GFR, Estimated: 38 mL/min — ABNORMAL LOW (ref >60)
Glucose, Bld: 142 mg/dL — ABNORMAL HIGH (ref 70–99)
Potassium: 4.2 mmol/L (ref 3.5–5.1)
Sodium: 141 mmol/L (ref 135–145)

## 2022-01-29 LAB — TROPONIN I (HIGH SENSITIVITY)
Troponin I (High Sensitivity): 19 ng/L — ABNORMAL HIGH (ref <18)
Troponin I (High Sensitivity): 28 ng/L — ABNORMAL HIGH (ref <18)

## 2022-01-29 NOTE — ED Notes (Signed)
First Nurse Note: Pt to ED via [facility] from home for chest pain. Pain started about 1 hour PTA. Pt has been having chest pain on and off x 1 week. Pain is worse today. Pt wears O2 at home, pt is normally on 2 liters but EMS increased O2 to 4 liter for pt comfort. Pt took 1 nitro prior to ems arrival. Pt was given 324 mg of aspirin by EMS. Pt has 18 G IV in the right forearm.  BP: 142 80 HR- Initial HR 117 but decreased to 90 RR- 22 SpO2- 97% on 4 liters

## 2022-01-29 NOTE — ED Triage Notes (Signed)
Pt to ED [facility] from home for SOB and CP since 3pm today. Hx MI in 2015. On 2L chronic O2. Pt found to have lung nodules about 1 month ago, has f/u scheduled for repeat CT. Former smoker, 2-3 packs/day for 45 years. Quit in 2011. States CP has resolved but was dull, central, non radiating and also was hard to get deep breath. EKG shown to provider.

## 2022-01-29 NOTE — ED Provider Notes (Signed)
[facility] Provider Note    Event Date/Time   First MD Initiated Contact with Patient 01/29/22 2357     (approximate)   History   Chest Pain and Shortness of Breath   HPI  William Mueller is a 71 y.o. male brought to the ED via EMS from home with a chief complaint of chest pain and shortness of breath.  Patient with a past medical history of CAD status post MI, CKD, CHF on 2 L nasal cannula oxygen who developed central chest tightness associated with shortness of breath this evening.  Resolved with 1 nitroglycerin taken at home.  EMS gave 4 baby aspirin's prior to arrival.  Denies recent fever, cough, associated diaphoresis, palpitations, nausea, vomiting or dizziness.     Past Medical History   Past Medical History:  Diagnosis Date   Anginal pain ([place]) 06/2017   Anxiety    Bronchitis 06/2017   Chronic chest pain    Chronic kidney disease (CKD) stage G3b/A1, moderately decreased glomerular filtration rate (GFR) between 30-44 mL/min/1.73 square meter and albuminuria creatinine ratio less than 30 mg/g (HCC)    Chronic lower back pain    WITH LEG WEAKNESS   COPD (chronic obstructive pulmonary disease) (HCC)    EMPHYSEMA, O2 AT 2L PRN   Coronary artery disease    a. 12/2013 PCI: mRCA 100% with L to R collats s/p PCI/DES;  b. 06/2014 MV: no ischemia/infarct, EF 53%;  c. 03/2015 Cath: LM nl, LAD nl, D1 80 (1.63m), LCX 973m<1.86m73m OM1 nl, RCA 55p, patent stent, RPDA nl, EF 65%; d. 09/2015 Cath: LM nl, LAD 78m34m 80 (<2mm)77mCX 56m/d58m.86mm), 62m nl, RCA 55p (FFR 0.93), patent stent, RPDA nl-->Med Rx.   Cryptogenic stroke (HCC) [place]/2021   Some right sided weakness and balance issues   Daily headache    Depression    Diastolic dysfunction    a. echo 12/2013: EF 55-60%, mild LVH, GR1DD, inf HK, elevated CVP, mildly dilated IVC suggestive of increased RA pressure   Dyspnea    WHEEZING   GERD (gastroesophageal reflux disease)    REFLUX   Hearing loss     High cholesterol    Hypertension    Myocardial infarction (HCC) [place]  Nicotine addiction    a. using eCigs.   Orthopnea    Sleep apnea    No CPAP   Status post placement of implantable loop recorder 07/16/2020   Vertigo      Active Problem List   Patient Active Problem List   Diagnosis Date Noted   Chronic respiratory failure with hypoxia (HCC) [place]/2022   TIA (transient ischemic attack) 05/07/2020   Prolonged QT interval 05/07/2020   Chest pain 09/30/2017   Hypokalemia 09/29/2017   Near syncope 09/29/2017   Orthostatic hypotension 09/29/2017   Nicotine addiction    CAD S/P PCI DES to RCA 10/04/283/38/2505tolic dysfunction    Chronic chest pain    Chest pain at rest 11/23/2014   GERD (gastroesophageal reflux disease) 06/08/2014   CKD (chronic kidney disease) stage 3, GFR 30-59 ml/min (HCC) 05/04/2014   Bilateral leg pain 05/04/2014   Lung nodule < 6cm on CT 05/04/2014   Abnormal CT scan, kidney 05/04/2014   Unstable angina (HCC) 11[place]/2015   Atrial tachycardia, paroxysmal (HCC) 07[place]/2015   Atherosclerosis of native coronary artery with unstable angina pectoris (HCC) 07[place]/2015   Labile essential hypertension 12/11/2013   Acute renal failure superimposed on stage 3 chronic kidney  disease ([place]) 12/11/2013   Hyperlipidemia 12/11/2013   COPD (chronic obstructive pulmonary disease) ([place]) 12/11/2013     Past Surgical History   Past Surgical History:  Procedure Laterality Date   CARDIAC CATHETERIZATION     MC x 1 stent   CARDIAC CATHETERIZATION Left 03/12/2015   Procedure: Left Heart Cath and Coronary Angiography;  Surgeon: Leonie Man, MD;  Location: [facility];  Service: Cardiovascular;  Laterality: Left;   CARDIAC CATHETERIZATION N/A 09/29/2015   Procedure: Left Heart Cath and Coronary Angiography;  Surgeon: Wellington Hampshire, MD;  Location: [place] [facility];  Service: Cardiovascular;  Laterality: N/A;   CATARACT EXTRACTION W/PHACO Right 08/10/2017    Procedure: CATARACT EXTRACTION PHACO AND INTRAOCULAR LENS PLACEMENT ([place]) RIGHT;  Surgeon: Leandrew Koyanagi, MD;  Location: [facility];  Service: Ophthalmology;  Laterality: Right;   CATARACT EXTRACTION W/PHACO Left 10/15/2020   Procedure: CATARACT EXTRACTION PHACO AND INTRAOCULAR LENS PLACEMENT ([place]) LEFT;  Surgeon: Leandrew Koyanagi, MD;  Location: [facility];  Service: Ophthalmology;  Laterality: Left;  sleep apnea CDE 15.52 1:55.5 minutes 13.5%   CORONARY ANGIOPLASTY     STENT PLACEMENT   LEFT HEART CATH AND CORONARY ANGIOGRAPHY N/A 09/06/2018   Procedure: LEFT HEART CATH AND CORONARY ANGIOGRAPHY;  Surgeon: Minna Merritts, MD;  Location: [facility];  Service: Cardiovascular;  Laterality: N/A;   LEFT HEART CATHETERIZATION WITH CORONARY ANGIOGRAM N/A 12/12/2013   Procedure: LEFT HEART CATHETERIZATION WITH CORONARY ANGIOGRAM;  Surgeon: Wellington Hampshire, MD;  Location: [facility];  Service: Cardiovascular;  Laterality: N/A;     Home Medications   Prior to Admission medications   Medication Sig Start Date End Date Taking? Authorizing Provider  ALPRAZolam (XANAX XR) 2 MG 24 hr tablet Take 2 mg by mouth every morning. 12/22/21  Yes [provider]  aspirin 81 MG EC tablet Take 1 tablet (81 mg total) by mouth daily. 09/04/15  Yes Minna Merritts, MD  atorvastatin (LIPITOR) 80 MG tablet Take 1 tablet by mouth once daily 12/14/21  Yes Gollan, Kathlene November, MD  clopidogrel (PLAVIX) 75 MG tablet Take 1 tablet by mouth once daily 01/18/22  Yes Gollan, Kathlene November, MD  ezetimibe (ZETIA) 10 MG tablet Take 1 tablet by mouth once daily 01/22/22  Yes Gollan, Kathlene November, MD  Fluticasone-Umeclidin-Vilant (TRELEGY ELLIPTA) 100-62.5-25 MCG/ACT AEPB Inhale 1 puff into the lungs daily. 08/10/21  Yes Tyler Pita, MD  furosemide (LASIX) 20 MG tablet Take 20 mg by mouth daily.    Yes [provider]  KLOR-CON M20 20 MEQ tablet Take 20 mEq by mouth daily. 11/11/21  Yes  [provider]  metoprolol succinate (TOPROL-XL) 100 MG 24 hr tablet TAKE 1 TABLET BY MOUTH ONCE DAILY WITH  OR  IMMEDIATELY  FOLLOWING  A  MEAL 11/24/21  Yes Gollan, Kathlene November, MD  OXYGEN Inhale 2 L into the lungs daily as needed (oxygen).    Yes [provider]  pantoprazole (PROTONIX) 40 MG tablet Take 40 mg by mouth daily.   Yes [provider]  predniSONE (DELTASONE) 5 MG tablet TAKE 1 TABLET BY MOUTH ONCE DAILY FOR 90 DAYS 12/10/21  Yes [provider]  roflumilast (DALIRESP) 500 MCG TABS tablet Take 1 tablet (500 mcg total) by mouth daily. 04/23/21  Yes Tyler Pita, MD  sulfamethoxazole-trimethoprim (BACTRIM) 400-80 MG tablet TAKE 1 TABLET BY MOUTH 3 TIMES WEEKLY 12/10/21  Yes [provider]  acetaminophen (TYLENOL) 500 MG tablet  Take 1,000 mg by mouth daily as needed for headache.     [provider]  albuterol (VENTOLIN HFA) 108 (90 Base) MCG/ACT inhaler Inhale 2 puffs into the lungs every 6 (six) hours as needed for wheezing or shortness of breath. 07/15/21   Tyler Pita, MD  COMBIVENT RESPIMAT 20-100 MCG/ACT AERS respimat INHALE 1 PUFF BY MOUTH 4 TIMES DAILY AS NEEDED FOR WHEEZING 12/11/21   [provider]  fluticasone-salmeterol (ADVAIR) 250-50 MCG/ACT AEPB INHALE 1 DOSE BY MOUTH TWICE DAILY RINSE MOUTH AFTER Patient not taking: Reported on 01/30/2022 12/02/21   [provider]  nitroGLYCERIN (NITROSTAT) 0.4 MG SL tablet Place 1 tablet (0.4 mg total) under the tongue every 5 (five) minutes as needed for chest pain. 01/16/18   Minna Merritts, MD     Allergies  Paroxetine hcl, Serotonin reuptake inhibitors (ssris), Diazepam, Doxycycline monohydrate, Escitalopram oxalate, Lorazepam, and Tetracyclines & related   Family History   Family History  Problem Relation Age of Onset   Coronary artery disease Brother    Heart disease Brother 50   CVA Mother    Heart disease Father        MI x 8   Coronary  artery disease Father    Kidney disease Neg Hx    Prostate cancer Neg Hx      Physical Exam  Triage Vital Signs: ED Triage Vitals  Enc Vitals Group     BP 01/29/22 1717 (!) 157/92     Pulse Rate 01/29/22 1717 (!) 104     Resp 01/29/22 1717 16     Temp 01/29/22 1717 98.3 F (36.8 C)     Temp Source 01/29/22 1717 Oral     SpO2 01/29/22 1717 95 %     Weight 01/29/22 1717 191 lb (86.6 kg)     Height 01/29/22 1717 '5\' 9"'$  (1.753 m)     Head Circumference --      Peak Flow --      Pain Score 01/29/22 1728 0     Pain Loc --      Pain Edu? --      Excl. in [place]? --     Updated Vital Signs: BP (!) 154/80 (BP Location: Left Arm)   Pulse 73   Temp 98.1 F (36.7 C)   Resp 15   Ht '5\' 9"'$  (1.753 m)   Wt 86.6 kg   SpO2 100%   BMI 28.21 kg/m    General: Awake, no distress.  CV:  RRR.  Good peripheral perfusion.  Resp:  Normal effort.  CTA B. Abd:  Nontender.  No distention.  Other:  Bilateral legs are nonswollen and nontender.   ED Results / Procedures / Treatments  Labs (all labs ordered are listed, but only abnormal results are displayed) Labs Reviewed  CBC - Abnormal; Notable for the following components:      Result Value   WBC 11.5 (*)    Hemoglobin 12.6 (*)    All other components within normal limits  BASIC METABOLIC PANEL - Abnormal; Notable for the following components:   Glucose, Bld 142 (*)    BUN 25 (*)    Creatinine, Ser 1.84 (*)    Calcium 8.8 (*)    GFR, Estimated 38 (*)    All other components within normal limits  TROPONIN I (HIGH SENSITIVITY) - Abnormal; Notable for the following components:   Troponin I (High Sensitivity) 19 (*)    All other components within normal limits  TROPONIN I (HIGH SENSITIVITY) - Abnormal; Notable for the following components:   Troponin I (High Sensitivity) 28 (*)    All other components within normal limits  BASIC METABOLIC PANEL  CBC  TROPONIN I (HIGH SENSITIVITY)     EKG  ED ECG REPORT I, Meadow Abramo J, the  attending physician, personally viewed and interpreted this ECG.   Date: 01/30/2022  EKG Time: 1723  Rate: 104  Rhythm: sinus tachycardia  Axis: Normal  Intervals:none  ST&T Change: Lateral T wave inversion No significant change from 11/23/2021   RADIOLOGY I have independently visualized and interpreted patient's chest x-ray as well as noted the radiology interpretation:  Chest x-ray: No acute cardiopulmonary process  Official radiology report(s): DG Chest 2 View  Result Date: 01/29/2022 CLINICAL DATA:  Chest pain EXAM: CHEST - 2 VIEW COMPARISON:  03/24/2021 FINDINGS: Electronic recording device over the left chest. No acute airspace disease or pleural effusion. Normal cardiomediastinal silhouette. No pneumothorax IMPRESSION: No active cardiopulmonary disease. Electronically Signed   By: Donavan Foil M.D.   On: 01/29/2022 17:51     PROCEDURES:  Critical Care performed: Yes, see critical care procedure note(s)  CRITICAL CARE Performed by: Paulette Blanch   Total critical care time: 30 minutes  Critical care time was exclusive of separately billable procedures and treating other patients.  Critical care was necessary to treat or prevent imminent or life-threatening deterioration.  Critical care was time spent personally by me on the following activities: development of treatment plan with patient and/or surrogate as well as nursing, discussions with consultants, evaluation of patient's response to treatment, examination of patient, obtaining history from patient or surrogate, ordering and performing treatments and interventions, ordering and review of laboratory studies, ordering and review of radiographic studies, pulse oximetry and re-evaluation of patient's condition.   Marland Kitchen1-3 Lead EKG Interpretation  Performed by: Paulette Blanch, MD Authorized by: Paulette Blanch, MD     Interpretation: normal     ECG rate:  75   ECG rate assessment: normal     Rhythm: sinus rhythm     Ectopy:  none     Conduction: normal   Comments:     Patient placed on cardiac monitor to evaluate for arrhythmias    MEDICATIONS ORDERED IN ED: Medications  enoxaparin (LOVENOX) injection 40 mg (has no administration in time range)  0.9 %  sodium chloride infusion ( Intravenous New Bag/Given 01/30/22 0242)  acetaminophen (TYLENOL) tablet 650 mg (has no administration in time range)    Or  acetaminophen (TYLENOL) suppository 650 mg (has no administration in time range)  traZODone (DESYREL) tablet 25 mg (has no administration in time range)  magnesium hydroxide (MILK OF MAGNESIA) suspension 30 mL (has no administration in time range)  aspirin EC tablet 81 mg (has no administration in time range)  labetalol (NORMODYNE) injection 20 mg (has no administration in time range)  hydrALAZINE (APRESOLINE) injection 10 mg (10 mg Intravenous Given 01/30/22 0227)  nitroGLYCERIN (NITROSTAT) SL tablet 0.4 mg (has no administration in time range)  morphine (PF) 2 MG/ML injection 2 mg (has no administration in time range)  atorvastatin (LIPITOR) tablet 80 mg (has no administration in time range)  ezetimibe (ZETIA) tablet 10 mg (has no administration in time range)  furosemide (LASIX) tablet 20 mg (has no administration in time range)  metoprolol succinate (TOPROL-XL) 24 hr tablet 100 mg (has no administration in time range)  ALPRAZolam (XANAX XR) 24 hr tablet 2 mg (has no administration in time range)  predniSONE (DELTASONE) tablet 5 mg (has no administration in time range)  pantoprazole (PROTONIX) EC tablet 40 mg (has no administration in time range)  clopidogrel (PLAVIX) tablet 75 mg (has no administration in time range)  potassium chloride SA (KLOR-CON M) CR tablet 20 mEq (has no administration in time range)  roflumilast (DALIRESP) tablet 500 mcg (has no administration in time range)  albuterol (PROVENTIL) (2.5 MG/3ML) 0.083% nebulizer solution 2.5 mg (has no administration in time range)  nitroGLYCERIN  (NITROGLYN) 2 % ointment 1 inch (1 inch Topical Given 01/30/22 0011)     IMPRESSION / MDM / ASSESSMENT AND PLAN / ED COURSE  I reviewed the triage vital signs and the nursing notes.                             71 year old male presenting with chest pain. Differential diagnosis includes, but is not limited to, ACS, aortic dissection, pulmonary embolism, cardiac tamponade, pneumothorax, pneumonia, pericarditis, myocarditis, GI-related causes including esophagitis/gastritis, and musculoskeletal chest wall pain.   I have personally reviewed patient's records and see that he had a recent pulmonology visit on 12/24/2021 for follow-up lung nodules.  Patient's presentation is most consistent with acute presentation with potential threat to life or bodily function.  The patient is on the cardiac monitor to evaluate for evidence of arrhythmia and/or significant heart rate changes.  Laboratory results demonstrate normal H/H12/40, baseline EKG BUN 25/creatinine 1.84, uptrending troponins 19 --> 75. Blood pressure remains elevated; will apply ntg paste, consider hydralazine if needed as patient takes this at [facility] consult hospitalist services for evaluation and admission.      FINAL CLINICAL IMPRESSION(S) / ED DIAGNOSES   Final diagnoses:  Unstable angina pectoris ([place])  Hypertension, unspecified type     Rx / DC Orders   ED Discharge Orders     None        Note:  This document was prepared using Dragon voice recognition software and may include unintentional dictation errors.   Paulette Blanch, MD 01/30/22 [phone]

## 2022-01-30 DIAGNOSIS — Z9861 Coronary angioplasty status: Secondary | ICD-10-CM

## 2022-01-30 DIAGNOSIS — R079 Chest pain, unspecified: Secondary | ICD-10-CM

## 2022-01-30 DIAGNOSIS — K219 Gastro-esophageal reflux disease without esophagitis: Secondary | ICD-10-CM

## 2022-01-30 DIAGNOSIS — I10 Essential (primary) hypertension: Secondary | ICD-10-CM

## 2022-01-30 DIAGNOSIS — F419 Anxiety disorder, unspecified: Secondary | ICD-10-CM | POA: Diagnosis not present

## 2022-01-30 DIAGNOSIS — I2510 Atherosclerotic heart disease of native coronary artery without angina pectoris: Secondary | ICD-10-CM | POA: Diagnosis not present

## 2022-01-30 DIAGNOSIS — I272 Pulmonary hypertension, unspecified: Secondary | ICD-10-CM

## 2022-01-30 LAB — BASIC METABOLIC PANEL
Anion gap: 11 (ref 5–15)
BUN: 24 mg/dL — ABNORMAL HIGH (ref 8–23)
CO2: 28 mmol/L (ref 22–32)
Calcium: 8.6 mg/dL — ABNORMAL LOW (ref 8.9–10.3)
Chloride: 101 mmol/L (ref 98–111)
Creatinine, Ser: 1.95 mg/dL — ABNORMAL HIGH (ref 0.61–1.24)
GFR, Estimated: 36 mL/min — ABNORMAL LOW (ref >60)
Glucose, Bld: 89 mg/dL (ref 70–99)
Potassium: 3.9 mmol/L (ref 3.5–5.1)
Sodium: 140 mmol/L (ref 135–145)

## 2022-01-30 LAB — CBC
HCT: 36.6 % — ABNORMAL LOW (ref 39.0–52.0)
Hemoglobin: 11.5 g/dL — ABNORMAL LOW (ref 13.0–17.0)
MCH: 26.7 pg (ref 26.0–34.0)
MCHC: 31.4 g/dL (ref 30.0–36.0)
MCV: 85.1 fL (ref 80.0–100.0)
Platelets: 250 10*3/uL (ref 150–400)
RBC: 4.3 MIL/uL (ref 4.22–5.81)
RDW: 13.3 % (ref 11.5–15.5)
WBC: 11.3 10*3/uL — ABNORMAL HIGH (ref 4.0–10.5)
nRBC: 0 % (ref 0.0–0.2)

## 2022-01-30 LAB — TROPONIN I (HIGH SENSITIVITY): Troponin I (High Sensitivity): 26 ng/L — ABNORMAL HIGH (ref <18)

## 2022-01-30 MED ORDER — CLOPIDOGREL BISULFATE 75 MG PO TABS
75.0000 mg | ORAL_TABLET | Freq: Every day | ORAL | Status: DC
  Administered 2022-01-30: 75 mg via ORAL
  Filled 2022-01-30: qty 1

## 2022-01-30 MED ORDER — TRAZODONE HCL 50 MG PO TABS
25.0000 mg | ORAL_TABLET | Freq: Every evening | ORAL | Status: DC | PRN
Start: 2022-01-30 — End: 2022-01-30

## 2022-01-30 MED ORDER — ENOXAPARIN SODIUM 40 MG/0.4ML IJ SOSY
40.0000 mg | PREFILLED_SYRINGE | INTRAMUSCULAR | Status: DC
  Filled 2022-01-30: qty 0.4

## 2022-01-30 MED ORDER — PREDNISONE 10 MG PO TABS
5.0000 mg | ORAL_TABLET | Freq: Every day | ORAL | Status: DC
  Administered 2022-01-30: 5 mg via ORAL
  Filled 2022-01-30: qty 1

## 2022-01-30 MED ORDER — POTASSIUM CHLORIDE CRYS ER 20 MEQ PO TBCR
20.0000 meq | EXTENDED_RELEASE_TABLET | Freq: Every day | ORAL | Status: DC
Start: 2022-01-30 — End: 2022-01-30
  Administered 2022-01-30: 20 meq via ORAL
  Filled 2022-01-30: qty 1

## 2022-01-30 MED ORDER — SODIUM CHLORIDE 0.9 % IV SOLN: INTRAVENOUS | Status: DC

## 2022-01-30 MED ORDER — NITROGLYCERIN 0.4 MG SL SUBL: 0.4000 mg | SUBLINGUAL_TABLET | SUBLINGUAL | Status: DC | PRN

## 2022-01-30 MED ORDER — ALBUTEROL SULFATE (2.5 MG/3ML) 0.083% IN NEBU: 2.5000 mg | INHALATION_SOLUTION | Freq: Four times a day (QID) | RESPIRATORY_TRACT | Status: DC | PRN

## 2022-01-30 MED ORDER — ACETAMINOPHEN 650 MG RE SUPP
650.0000 mg | Freq: Four times a day (QID) | RECTAL | Status: DC | PRN
Start: 2022-01-30 — End: 2022-01-30

## 2022-01-30 MED ORDER — MAGNESIUM HYDROXIDE 400 MG/5ML PO SUSP: 30.0000 mL | Freq: Every day | ORAL | Status: DC | PRN

## 2022-01-30 MED ORDER — FUROSEMIDE 40 MG PO TABS
20.0000 mg | ORAL_TABLET | Freq: Every day | ORAL | Status: DC
  Administered 2022-01-30: 20 mg via ORAL
  Filled 2022-01-30: qty 1

## 2022-01-30 MED ORDER — LABETALOL HCL 5 MG/ML IV SOLN: 20.0000 mg | INTRAVENOUS | Status: DC | PRN

## 2022-01-30 MED ORDER — ROFLUMILAST 500 MCG PO TABS
500.0000 ug | ORAL_TABLET | Freq: Every day | ORAL | Status: DC
Start: 2022-01-30 — End: 2022-01-30
  Filled 2022-01-30: qty 1

## 2022-01-30 MED ORDER — MORPHINE SULFATE (PF) 2 MG/ML IV SOLN: 2.0000 mg | INTRAVENOUS | Status: DC | PRN

## 2022-01-30 MED ORDER — ATORVASTATIN CALCIUM 20 MG PO TABS
80.0000 mg | ORAL_TABLET | Freq: Every day | ORAL | Status: DC
  Administered 2022-01-30: 80 mg via ORAL
  Filled 2022-01-30: qty 4

## 2022-01-30 MED ORDER — METOPROLOL SUCCINATE ER 50 MG PO TB24
100.0000 mg | ORAL_TABLET | Freq: Every day | ORAL | Status: DC
  Administered 2022-01-30: 100 mg via ORAL
  Filled 2022-01-30: qty 2

## 2022-01-30 MED ORDER — ASPIRIN 81 MG PO TBEC
81.0000 mg | DELAYED_RELEASE_TABLET | Freq: Every day | ORAL | Status: DC
  Administered 2022-01-30: 81 mg via ORAL
  Filled 2022-01-30: qty 1

## 2022-01-30 MED ORDER — ALPRAZOLAM ER 1 MG PO TB24
2.0000 mg | ORAL_TABLET | Freq: Every morning | ORAL | Status: DC
  Filled 2022-01-30: qty 2

## 2022-01-30 MED ORDER — HYDRALAZINE HCL 20 MG/ML IJ SOLN
10.0000 mg | Freq: Four times a day (QID) | INTRAMUSCULAR | Status: DC | PRN
  Administered 2022-01-30: 10 mg via INTRAVENOUS
  Filled 2022-01-30: qty 1

## 2022-01-30 MED ORDER — EZETIMIBE 10 MG PO TABS: 10.0000 mg | ORAL_TABLET | Freq: Every day | ORAL | Status: DC

## 2022-01-30 MED ORDER — ACETAMINOPHEN 325 MG PO TABS
650.0000 mg | ORAL_TABLET | Freq: Four times a day (QID) | ORAL | Status: DC | PRN
Start: 2022-01-30 — End: 2022-01-30
  Administered 2022-01-30: 650 mg via ORAL
  Filled 2022-01-30: qty 2

## 2022-01-30 MED ORDER — PANTOPRAZOLE SODIUM 40 MG PO TBEC
40.0000 mg | DELAYED_RELEASE_TABLET | Freq: Every day | ORAL | Status: DC
  Administered 2022-01-30: 40 mg via ORAL
  Filled 2022-01-30: qty 1

## 2022-01-30 MED ORDER — ALBUTEROL SULFATE HFA 108 (90 BASE) MCG/ACT IN AERS
2.0000 | INHALATION_SPRAY | Freq: Four times a day (QID) | RESPIRATORY_TRACT | Status: DC | PRN
Start: 2022-01-30 — End: 2022-01-30

## 2022-01-30 MED ORDER — NITROGLYCERIN 2 % TD OINT
1.0000 [in_us] | TOPICAL_OINTMENT | Freq: Once | TRANSDERMAL | Status: AC
  Administered 2022-01-30: 1 [in_us] via TOPICAL
  Filled 2022-01-30: qty 1

## 2022-01-30 MED ORDER — ASPIRIN 81 MG PO TBEC: 81.0000 mg | DELAYED_RELEASE_TABLET | Freq: Every day | ORAL | Status: DC

## 2022-01-30 NOTE — Assessment & Plan Note (Signed)
-   Continue aspirin Plavix, statin therapy and Toprol-XL.

## 2022-01-30 NOTE — Assessment & Plan Note (Addendum)
-   The patient admitted to a cardiac telemetry bed. - We will follow serial troponins. - He will be placed on aspirin as well as as needed sublingual nitroglycerin and IV morphine sulfate for pain. - We will continue his Plavix. - We will continue beta-blocker therapy with Toprol-XL. - Cardiology consult will be obtained. - I notified Dr. Curt Bears about the patient.

## 2022-01-30 NOTE — Assessment & Plan Note (Signed)
-   We will continue PPI therapy 

## 2022-01-30 NOTE — Assessment & Plan Note (Signed)
-   We will continue Xanax. 

## 2022-01-30 NOTE — Assessment & Plan Note (Signed)
-   We will continue his antihypertensives. 

## 2022-01-30 NOTE — Assessment & Plan Note (Signed)
-   We will continue Daliresp.

## 2022-01-30 NOTE — H&P (Signed)
[facility]   PATIENT NAME: William Mueller    MR#:  [id number]  DATE OF BIRTH:  [birth year]  DATE OF ADMISSION:  01/29/2022  PRIMARY CARE PHYSICIAN: Hamrick, Lorin Mercy, MD   Patient is coming from: Home  REQUESTING/REFERRING PHYSICIAN: Lurline Hare, MD  CHIEF COMPLAINT:   Chief Complaint  Patient presents with   Chest Pain   Shortness of Breath    HISTORY OF PRESENT ILLNESS:  William Mueller is a 71 y.o. male with medical history significant for coronary artery disease,, status post PCI and stent, stage IIIb chronic kidney disease, anxiety, COPD, depression, diastolic dysfunction, GERD and tobacco abuse, who presented to the emergency room with a [place] of midsternal chest pain felt as pressure and tightness and graded 10/10 in severity with associated diaphoresis.  He stated that it felt like when he had his MI in 2015.  He had associated mild palpitations as well as dyspnea.  No cough or wheezing or hemoptysis.  No leg pain but he does have mild left foot swelling that is chronic.  He admitted to constipation without diarrhea or melena or bright red bleeding per rectum.  No dysuria, oliguria or hematuria or flank pain.  No other bleeding diathesis.  ED Course: When he came to the ER, BP was 157/92 with heart rate of 104 with pulse oximetry 95% on 2 L of O2 by nasal cannula.  Labs revealed glucose of 142 with a BUN of 25 and creatinine 1.84 that is stable and calcium 8.8.  CBC showed leukocytosis of 11.5 and was otherwise unremarkable. EKG as reviewed by me : EKG showed sinus tachycardia with a rate of 104 with T wave inversion laterally. Imaging: Two-view chest ray showed no acute cardiopulmonary disease.  The patient was given initially Nitropaste in the ER.  He will be admitted to an observation cardiac telemetry bed for further evaluation and management. PAST MEDICAL HISTORY:   Past Medical History:  Diagnosis Date   Anginal pain ([place]) 06/2017   Anxiety    Bronchitis  06/2017   Chronic chest pain    Chronic kidney disease (CKD) stage G3b/A1, moderately decreased glomerular filtration rate (GFR) between 30-44 mL/min/1.73 square meter and albuminuria creatinine ratio less than 30 mg/g (HCC)    Chronic lower back pain    WITH LEG WEAKNESS   COPD (chronic obstructive pulmonary disease) (HCC)    EMPHYSEMA, O2 AT 2L PRN   Coronary artery disease    a. 12/2013 PCI: mRCA 100% with L to R collats s/p PCI/DES;  b. 06/2014 MV: no ischemia/infarct, EF 53%;  c. 03/2015 Cath: LM nl, LAD nl, D1 80 (1.56m), LCX 914m<1.61m61m OM1 nl, RCA 55p, patent stent, RPDA nl, EF 65%; d. 09/2015 Cath: LM nl, LAD 85m64m 80 (<2mm)109mCX 57m/d58m.61mm), 26m nl, RCA 55p (FFR 0.93), patent stent, RPDA nl-->Med Rx.   Cryptogenic stroke (HCC) [place]/2021   Some right sided weakness and balance issues   Daily headache    Depression    Diastolic dysfunction    a. echo 12/2013: EF 55-60%, mild LVH, GR1DD, inf HK, elevated CVP, mildly dilated IVC suggestive of increased RA pressure   Dyspnea    WHEEZING   GERD (gastroesophageal reflux disease)    REFLUX   Hearing loss    High cholesterol    Hypertension    Myocardial infarction (HCC) [place]  Nicotine addiction    a. using eCigs.   Orthopnea    Sleep apnea  No CPAP   Status post placement of implantable loop recorder 07/16/2020   Vertigo     PAST SURGICAL HISTORY:   Past Surgical History:  Procedure Laterality Date   CARDIAC CATHETERIZATION     MC x 1 stent   CARDIAC CATHETERIZATION Left 03/12/2015   Procedure: Left Heart Cath and Coronary Angiography;  Surgeon: Leonie Man, MD;  Location: [facility];  Service: Cardiovascular;  Laterality: Left;   CARDIAC CATHETERIZATION N/A 09/29/2015   Procedure: Left Heart Cath and Coronary Angiography;  Surgeon: Wellington Hampshire, MD;  Location: [facility];  Service: Cardiovascular;  Laterality: N/A;   CATARACT EXTRACTION W/PHACO Right 08/10/2017   Procedure: CATARACT  EXTRACTION PHACO AND INTRAOCULAR LENS PLACEMENT ([place]) RIGHT;  Surgeon: Leandrew Koyanagi, MD;  Location: [facility];  Service: Ophthalmology;  Laterality: Right;   CATARACT EXTRACTION W/PHACO Left 10/15/2020   Procedure: CATARACT EXTRACTION PHACO AND INTRAOCULAR LENS PLACEMENT ([place]) LEFT;  Surgeon: Leandrew Koyanagi, MD;  Location: [facility];  Service: Ophthalmology;  Laterality: Left;  sleep apnea CDE 15.52 1:55.5 minutes 13.5%   CORONARY ANGIOPLASTY     STENT PLACEMENT   LEFT HEART CATH AND CORONARY ANGIOGRAPHY N/A 09/06/2018   Procedure: LEFT HEART CATH AND CORONARY ANGIOGRAPHY;  Surgeon: Minna Merritts, MD;  Location: [facility];  Service: Cardiovascular;  Laterality: N/A;   LEFT HEART CATHETERIZATION WITH CORONARY ANGIOGRAM N/A 12/12/2013   Procedure: LEFT HEART CATHETERIZATION WITH CORONARY ANGIOGRAM;  Surgeon: Wellington Hampshire, MD;  Location: [facility];  Service: Cardiovascular;  Laterality: N/A;    SOCIAL HISTORY:   Social History   Tobacco Use   Smoking status: Former    Packs/day: 3.00    Years: 48.00    Total pack years: 144.00    Types: E-cigarettes, Cigarettes    Quit date: 06/17/2009    Years since quitting: 12.6   Smokeless tobacco: Never   Tobacco comments:    quit e-cigs 03/2020  Substance Use Topics   Alcohol use: Yes    Alcohol/week: 6.0 standard drinks of alcohol    Types: 6 Cans of beer per week    Comment: Occasional     FAMILY HISTORY:   Family History  Problem Relation Age of Onset   Coronary artery disease Brother    Heart disease Brother 74   CVA Mother    Heart disease Father        MI x 8   Coronary artery disease Father    Kidney disease Neg Hx    Prostate cancer Neg Hx     DRUG ALLERGIES:   Allergies  Allergen Reactions   Paroxetine Hcl Shortness Of Breath and Palpitations   Serotonin Reuptake Inhibitors (Ssris) Shortness Of Breath and Palpitations   Diazepam Other (See Comments)    Rapid  heartrate   Doxycycline Monohydrate Other (See Comments)    Unknown allergic reaction   Escitalopram Oxalate Other (See Comments)    serotonin Syndrome   Lorazepam Other (See Comments)    Unknown allergic reaction   Tetracyclines & Related Itching and Rash    REVIEW OF SYSTEMS:   ROS As per history of present illness. All pertinent systems were reviewed above. Constitutional, HEENT, cardiovascular, respiratory, GI, GU, musculoskeletal, neuro, psychiatric, endocrine, integumentary and hematologic systems were reviewed and are otherwise negative/unremarkable except for positive findings mentioned above in the HPI.   MEDICATIONS AT HOME:   Prior to Admission medications   Medication Sig Start Date End Date  Taking? Authorizing Provider  ALPRAZolam (XANAX XR) 2 MG 24 hr tablet Take 2 mg by mouth every morning. 12/22/21  Yes [provider]  aspirin 81 MG EC tablet Take 1 tablet (81 mg total) by mouth daily. 09/04/15  Yes Minna Merritts, MD  atorvastatin (LIPITOR) 80 MG tablet Take 1 tablet by mouth once daily 12/14/21  Yes Gollan, Kathlene November, MD  clopidogrel (PLAVIX) 75 MG tablet Take 1 tablet by mouth once daily 01/18/22  Yes Gollan, Kathlene November, MD  ezetimibe (ZETIA) 10 MG tablet Take 1 tablet by mouth once daily 01/22/22  Yes Gollan, Kathlene November, MD  Fluticasone-Umeclidin-Vilant (TRELEGY ELLIPTA) 100-62.5-25 MCG/ACT AEPB Inhale 1 puff into the lungs daily. 08/10/21  Yes Tyler Pita, MD  furosemide (LASIX) 20 MG tablet Take 20 mg by mouth daily.    Yes [provider]  KLOR-CON M20 20 MEQ tablet Take 20 mEq by mouth daily. 11/11/21  Yes [provider]  metoprolol succinate (TOPROL-XL) 100 MG 24 hr tablet TAKE 1 TABLET BY MOUTH ONCE DAILY WITH  OR  IMMEDIATELY  FOLLOWING  A  MEAL 11/24/21  Yes Gollan, Kathlene November, MD  OXYGEN Inhale 2 L into the lungs daily as needed (oxygen).    Yes [provider]  pantoprazole (PROTONIX) 40 MG tablet Take 40 mg by mouth  daily.   Yes [provider]  predniSONE (DELTASONE) 5 MG tablet TAKE 1 TABLET BY MOUTH ONCE DAILY FOR 90 DAYS 12/10/21  Yes [provider]  roflumilast (DALIRESP) 500 MCG TABS tablet Take 1 tablet (500 mcg total) by mouth daily. 04/23/21  Yes Tyler Pita, MD  sulfamethoxazole-trimethoprim (BACTRIM) 400-80 MG tablet TAKE 1 TABLET BY MOUTH 3 TIMES WEEKLY 12/10/21  Yes [provider]  acetaminophen (TYLENOL) 500 MG tablet Take 1,000 mg by mouth daily as needed for headache.     [provider]  albuterol (VENTOLIN HFA) 108 (90 Base) MCG/ACT inhaler Inhale 2 puffs into the lungs every 6 (six) hours as needed for wheezing or shortness of breath. 07/15/21   Tyler Pita, MD  COMBIVENT RESPIMAT 20-100 MCG/ACT AERS respimat INHALE 1 PUFF BY MOUTH 4 TIMES DAILY AS NEEDED FOR WHEEZING 12/11/21   [provider]  fluticasone-salmeterol (ADVAIR) 250-50 MCG/ACT AEPB INHALE 1 DOSE BY MOUTH TWICE DAILY RINSE MOUTH AFTER Patient not taking: Reported on 01/30/2022 12/02/21   [provider]  nitroGLYCERIN (NITROSTAT) 0.4 MG SL tablet Place 1 tablet (0.4 mg total) under the tongue every 5 (five) minutes as needed for chest pain. 01/16/18   Minna Merritts, MD      VITAL SIGNS:  Blood pressure (!) 154/80, pulse 73, temperature 98.1 F (36.7 C), resp. rate 15, height '5\' 9"'$  (1.753 m), weight 86.6 kg, SpO2 100 %.  PHYSICAL EXAMINATION:  Physical Exam  GENERAL:  71 y.o.-year-old Caucasian male patient lying in the bed with no acute distress.  EYES: Pupils equal, round, reactive to light and accommodation. No scleral icterus. Extraocular muscles intact.  HEENT: Head atraumatic, normocephalic. Oropharynx and nasopharynx clear.  NECK:  Supple, no jugular venous distention. No thyroid enlargement, no tenderness.  LUNGS: Normal breath sounds bilaterally, no wheezing, rales,rhonchi or crepitation. No use of accessory muscles of respiration.  CARDIOVASCULAR:  Regular rate and rhythm, S1, S2 normal. No murmurs, rubs, or gallops.  ABDOMEN: Soft, nondistended, nontender. Bowel sounds present. No organomegaly or mass.  EXTREMITIES: Left ankle chronic 2+ pitting edema with no cyanosis, or clubbing.  NEUROLOGIC: Cranial nerves II  through XII are intact. Muscle strength 5/5 in all extremities. Sensation intact. Gait not checked.  PSYCHIATRIC: The patient is alert and oriented x 3.  Normal affect and good eye contact. SKIN: No obvious rash, lesion, or ulcer.   LABORATORY PANEL:   CBC Recent Labs  Lab 01/29/22 1730  WBC 11.5*  HGB 12.6*  HCT 40.5  PLT 285   ------------------------------------------------------------------------------------------------------------------  Chemistries  Recent Labs  Lab 01/29/22 1834  NA 141  K 4.2  CL 102  CO2 31  GLUCOSE 142*  BUN 25*  CREATININE 1.84*  CALCIUM 8.8*   ------------------------------------------------------------------------------------------------------------------  Cardiac Enzymes No results for input(s): "TROPONINI" in the last 168 hours. ------------------------------------------------------------------------------------------------------------------  RADIOLOGY:  DG Chest 2 View  Result Date: 01/29/2022 CLINICAL DATA:  Chest pain EXAM: CHEST - 2 VIEW COMPARISON:  03/24/2021 FINDINGS: Electronic recording device over the left chest. No acute airspace disease or pleural effusion. Normal cardiomediastinal silhouette. No pneumothorax IMPRESSION: No active cardiopulmonary disease. Electronically Signed   By: Donavan Foil M.D.   On: 01/29/2022 17:51      IMPRESSION AND PLAN:  Assessment and Plan: Chest pain - The patient admitted to a cardiac telemetry bed. - We will follow serial troponins. - He will be placed on aspirin as well as as needed sublingual nitroglycerin and IV morphine sulfate for pain. - We will continue his Plavix. - We will continue beta-blocker therapy with  Toprol-XL. - Cardiology consult will be obtained. - I notified Dr. Curt Bears about the patient.  CAD S/P PCI DES to RCA - Continue aspirin Plavix, statin therapy and Toprol-XL.  Labile essential hypertension We will continue his antihypertensives.  Anxiety - We will continue Xanax.  GERD without esophagitis - We will continue PPI therapy  Pulmonary hypertension (HCC) - We will continue Daliresp.       DVT prophylaxis: Lovenox. Advanced Care Planning:  Code Status: full code. Family Communication:  The plan of care was discussed in details with the patient (and family). I answered all questions. The patient agreed to proceed with the above mentioned plan. Further management will depend upon hospital course. Disposition Plan: Back to previous home environment Consults called: Urology. All the records are reviewed and case discussed with ED provider.  Status is: Observation   I certify that at the time of admission, it is my clinical judgment that the patient will require  hospital care extending less than 2 midnights.                            Dispo: The patient is from: Home              Anticipated d/c is to: Home              Patient currently is not medically stable to d/c.              Difficult to place patient: No  Christel Mormon M.D on 01/30/2022 at 5:46 AM  [facility]   From 7 PM-7 AM, contact night-coverage [email or web address]  CC: Primary care physician; Hamrick, Lorin Mercy, MD

## 2022-01-30 NOTE — Progress Notes (Signed)
Pt divulged to me that his chest pain started yesterday when his caregiver who had worked for him the last 4 months and who he really "hit it off with" announced that her company was moving her and she was being transferred to another location and he would be getting a different caregiver. It was after she left the house that his chest pain started and he said he was very discouraged after finding out that news from her.

## 2022-01-30 NOTE — Discharge Summary (Signed)
Physician Discharge Summary   Patient: William Mueller MRN: [id number]  DOB: 12-10-1950   Admit:     Date of Admission: 01/29/2022 Admitted from: home   Discharge: Date of discharge: 01/30/22 Disposition: Home Condition at discharge: good  CODE STATUS: FULL     Discharge Physician: Emeterio Reeve, DO [facility]     PCP: Leonides Sake, MD  Recommendations for Outpatient Follow-up:  Follow up with PCP Hamrick, Maura L, MD in 2-4 weeks, ensure appropriate follow up with cardiology outpatient  PCP AND OTHER OUTPATIENT PROVIDERS: SEE BELOW FOR SPECIFIC DISCHARGE INSTRUCTIONS PRINTED FOR PATIENT IN ADDITION TO GENERIC AVS PATIENT INFO    Discharge Instructions     Diet - low sodium heart healthy   Complete by: As directed    Discharge instructions   Complete by: As directed    Please call your cardiologist on Monday and let them know you were in the ER, they may want to follow up with you sooner than scheduled or will advise you to keep any currently scheduled appointments.   Increase activity slowly   Complete by: As directed       Hospital Course: William Mueller is a 71 y.o. male with medical history significant for coronary artery disease,, status post PCI and stent, stage IIIb chronic kidney disease, anxiety, COPD, depression, diastolic dysfunction, GERD and tobacco abuse, who presented to the emergency room w/ midsternal chest pain felt as pressure and tightness and graded 10/10 in severity with associated diaphoresis.  He developed this chest pain immediately after one of his favorite caregivers reported to him that she was being transferred and would no longer be working with him, which made him upset.  In ED, BP 157/92, HR 104, SPO2 95% on 2L, CR 1.84, EKG showed sinus tachycardia with lateral T wave inversion which appears stable from previous EKGs.  No concerns on CXR.  Admitted for observation, troponins went from 19 up to 28 then back down to 26.   Patient's chest pain totally resolved and he states he had no issues overnight.  Patient is asking for discharge home.    Consultants:  none  Procedures:  none      Discharge Diagnoses: Active Problems:   Chest pain   CAD S/P PCI DES to RCA   Labile essential hypertension   Pulmonary hypertension (HCC)   GERD without esophagitis   Anxiety    Assessment & Plan:  Chest pain RESOLVED Troponins flat/downtrend No ACS on EKG Patient requesting discharge Follow with cardiology outpatient Encourage medication compliance including with nitroglycerin as needed  CAD S/P PCI DES to RCA Continue aspirin Plavix, statin therapy and Toprol-XL.  Labile essential hypertension continue his antihypertensives.  Pulmonary hypertension ([place]) continue Daliresp.  GERD without esophagitis continue PPI therapy  Anxiety continue Xanax.      Discharge Instructions  Allergies as of 01/30/2022       Reactions   Paroxetine Hcl Shortness Of Breath, Palpitations   Serotonin Reuptake Inhibitors (ssris) Shortness Of Breath, Palpitations   Diazepam Other (See Comments)   Rapid heartrate   Doxycycline Monohydrate Other (See Comments)   Unknown allergic reaction   Escitalopram Oxalate Other (See Comments)   serotonin Syndrome   Lorazepam Other (See Comments)   Unknown allergic reaction   Tetracyclines & Related Itching, Rash        Medication List     STOP taking these medications    fluticasone-salmeterol 250-50 MCG/ACT Aepb Commonly known as: ADVAIR  TAKE these medications    acetaminophen 500 MG tablet Commonly known as: TYLENOL Take 1,000 mg by mouth daily as needed for headache.   albuterol 108 (90 Base) MCG/ACT inhaler Commonly known as: VENTOLIN HFA Inhale 2 puffs into the lungs every 6 (six) hours as needed for wheezing or shortness of breath.   ALPRAZolam 2 MG 24 hr tablet Commonly known as: XANAX XR Take 2 mg by mouth every morning.   aspirin  EC 81 MG tablet Take 1 tablet (81 mg total) by mouth daily.   atorvastatin 80 MG tablet Commonly known as: LIPITOR Take 1 tablet by mouth once daily   clopidogrel 75 MG tablet Commonly known as: PLAVIX Take 1 tablet by mouth once daily   Combivent Respimat 20-100 MCG/ACT Aers respimat Generic drug: Ipratropium-Albuterol INHALE 1 PUFF BY MOUTH 4 TIMES DAILY AS NEEDED FOR WHEEZING   ezetimibe 10 MG tablet Commonly known as: ZETIA Take 1 tablet by mouth once daily   furosemide 20 MG tablet Commonly known as: LASIX Take 20 mg by mouth daily.   Klor-Con M20 20 MEQ tablet Generic drug: potassium chloride SA Take 20 mEq by mouth daily.   metoprolol succinate 100 MG 24 hr tablet Commonly known as: TOPROL-XL TAKE 1 TABLET BY MOUTH ONCE DAILY WITH  OR  IMMEDIATELY  FOLLOWING  A  MEAL   nitroGLYCERIN 0.4 MG SL tablet Commonly known as: Nitrostat Place 1 tablet (0.4 mg total) under the tongue every 5 (five) minutes as needed for chest pain.   OXYGEN Inhale 2 L into the lungs daily as needed (oxygen).   pantoprazole 40 MG tablet Commonly known as: PROTONIX Take 40 mg by mouth daily.   predniSONE 5 MG tablet Commonly known as: DELTASONE TAKE 1 TABLET BY MOUTH ONCE DAILY FOR 90 DAYS   roflumilast 500 MCG Tabs tablet Commonly known as: DALIRESP Take 1 tablet (500 mcg total) by mouth daily.   sulfamethoxazole-trimethoprim 400-80 MG tablet Commonly known as: BACTRIM TAKE 1 TABLET BY MOUTH 3 TIMES WEEKLY   Trelegy Ellipta 100-62.5-25 MCG/ACT Aepb Generic drug: Fluticasone-Umeclidin-Vilant Inhale 1 puff into the lungs daily.          Allergies  Allergen Reactions   Paroxetine Hcl Shortness Of Breath and Palpitations   Serotonin Reuptake Inhibitors (Ssris) Shortness Of Breath and Palpitations   Diazepam Other (See Comments)    Rapid heartrate   Doxycycline Monohydrate Other (See Comments)    Unknown allergic reaction   Escitalopram Oxalate Other (See Comments)     serotonin Syndrome   Lorazepam Other (See Comments)    Unknown allergic reaction   Tetracyclines & Related Itching and Rash     Subjective: Patient reports no chest pain since he was initially treated in the ED.  He thinks episode most likely due to stress as noted above.  No chest pain/shortness of breath, no headache, no dizziness.   Discharge Exam: BP (!) 153/79   Pulse 96   Temp 97.8 F (36.6 C) (Oral)   Resp 17   Ht '5\' 9"'$  (1.753 m)   Wt 86.6 kg   SpO2 93%   BMI 28.21 kg/m  General: Pt is alert, awake, not in acute distress Cardiovascular: RRR, S1/S2 +, no rubs, no gallops Respiratory: CTA bilaterally, no wheezing, no rhonchi Abdominal: Soft, NT, ND, bowel sounds + Extremities: no edema, no cyanosis     The results of significant diagnostics from this hospitalization (including imaging, microbiology, ancillary and laboratory) are listed below for reference.  Microbiology: No results found for this or any previous visit (from the past 240 hour(s)).   Labs: BNP (last 3 results) No results for input(s): "BNP" in the last 8760 hours. Basic Metabolic Panel: Recent Labs  Lab 01/29/22 1834 01/30/22 0555  NA 141 140  K 4.2 3.9  CL 102 101  CO2 31 28  GLUCOSE 142* 89  BUN 25* 24*  CREATININE 1.84* 1.95*  CALCIUM 8.8* 8.6*   Liver Function Tests: No results for input(s): "AST", "ALT", "ALKPHOS", "BILITOT", "PROT", "ALBUMIN" in the last 168 hours. No results for input(s): "LIPASE", "AMYLASE" in the last 168 hours. No results for input(s): "AMMONIA" in the last 168 hours. CBC: Recent Labs  Lab 01/29/22 1730 01/30/22 0555  WBC 11.5* 11.3*  HGB 12.6* 11.5*  HCT 40.5 36.6*  MCV 86.4 85.1  PLT 285 250   Cardiac Enzymes: No results for input(s): "CKTOTAL", "CKMB", "CKMBINDEX", "TROPONINI" in the last 168 hours. BNP: Invalid input(s): "POCBNP" CBG: No results for input(s): "GLUCAP" in the last 168 hours. D-Dimer No results for input(s): "DDIMER" in the  last 72 hours. Hgb A1c No results for input(s): "HGBA1C" in the last 72 hours. Lipid Profile No results for input(s): "CHOL", "HDL", "LDLCALC", "TRIG", "CHOLHDL", "LDLDIRECT" in the last 72 hours. Thyroid function studies No results for input(s): "TSH", "T4TOTAL", "T3FREE", "THYROIDAB" in the last 72 hours.  Invalid input(s): "FREET3" Anemia work up No results for input(s): "VITAMINB12", "FOLATE", "FERRITIN", "TIBC", "IRON", "RETICCTPCT" in the last 72 hours. Urinalysis    Component Value Date/Time   COLORURINE COLORLESS (A) 12/14/2020 0816   APPEARANCEUR CLEAR (A) 12/14/2020 0816   APPEARANCEUR Clear 09/28/2017 0822   LABSPEC 1.009 12/14/2020 0816   LABSPEC 1.002 04/21/2013 1022   PHURINE 7.0 12/14/2020 0816   GLUCOSEU NEGATIVE 12/14/2020 0816   GLUCOSEU Negative 04/21/2013 1022   HGBUR SMALL (A) 12/14/2020 0816   BILIRUBINUR NEGATIVE 12/14/2020 0816   BILIRUBINUR Negative 09/28/2017 0822   BILIRUBINUR Negative 04/21/2013 1022   KETONESUR NEGATIVE 12/14/2020 0816   PROTEINUR NEGATIVE 12/14/2020 0816   UROBILINOGEN 0.2 11/22/2014 2213   NITRITE NEGATIVE 12/14/2020 0816   LEUKOCYTESUR NEGATIVE 12/14/2020 0816   LEUKOCYTESUR Negative 04/21/2013 1022   Sepsis Labs Recent Labs  Lab 01/29/22 1730 01/30/22 0555  WBC 11.5* 11.3*   Microbiology No results found for this or any previous visit (from the past 240 hour(s)). Imaging DG Chest 2 View  Result Date: 01/29/2022 CLINICAL DATA:  Chest pain EXAM: CHEST - 2 VIEW COMPARISON:  03/24/2021 FINDINGS: Electronic recording device over the left chest. No acute airspace disease or pleural effusion. Normal cardiomediastinal silhouette. No pneumothorax IMPRESSION: No active cardiopulmonary disease. Electronically Signed   By: Donavan Foil M.D.   On: 01/29/2022 17:51      Time coordinating discharge: Over 30 minutes  SIGNED:  Emeterio Reeve DO [facility]

## 2022-02-01 ENCOUNTER — Telehealth: Payer: Self-pay | Admitting: Cardiovascular Disease

## 2022-02-01 NOTE — Telephone Encounter (Signed)
Tried [employer] but did not get a response.  Wife was notified a nurse will call back.

## 2022-02-01 NOTE — Telephone Encounter (Signed)
I spoke with the patient's wife (ok per DPR).   She states the patient woke up ~ 5 am this morning, with some chest tightness. He went to bed ~ 8:30 am, but she did check his BP at this time and he was 182/91. She gave him a dose of hydralazine.   He is still currently resting.   However, he was reporting a constant chest tightness. BP did come down to 154/78, but he was also telling his wife that he was feeling "skips" in his heart beat.   The patient was in the ER 8/25/ 8/26 with chest discomfort.  Troponins were slightly elevated (19/ 26/ 28).   The patient was released to home after his symptoms resolved completely and advised to call cardiology today.   Per Mrs. Dost, the patient is scheduled to see Thurmond Butts, [place] on 8/29, but with symptoms of chest tightness today, they were uncertain as to how to proceed.  I have advised Mrs. Morriss, that with the patient's cardiac history, I cannot determine if his symptoms today may be due to his elevated BP, "skips", or an actual blood flow issue.   I inquired if the patient has taken NTG at all and per Mrs. Malatesta, he has not.  I have advised that the patient try NTG to see if this improves his symptoms. She is aware that if symptoms are somewhat improved with NTG, but not completely relieved, he should report to the ER for further evaluation and possible treatment.  She is advised to call 911 if he is having active symptoms.  Otherwise, if he takes NTG and symptoms resolve, we will see him tomorrow as scheduled.  Mrs. Nimmons voices understanding and is agreeable.

## 2022-02-01 NOTE — Telephone Encounter (Signed)
Patient c/o Palpitations:  High priority if patient c/o lightheadedness, shortness of breath, or chest pain  How long have you had palpitations/irregular HR/ Afib? Are you having the symptoms now?  This morning (past hour)  Are you currently experiencing lightheadedness, SOB or CP?  Chest feels a little tight, some SOB  Do you have a history of afib (atrial fibrillation) or irregular heart rhythm?    Have you checked your BP or HR? (document readings if available):  BP 154/74  Are you experiencing any other symptoms? No   Wife called stating patient is skipping a heart beat according to his finger oxygen device -  PI 3.8.  Pulse is 97.

## 2022-02-02 ENCOUNTER — Encounter: Payer: Self-pay | Admitting: Physician Assistant

## 2022-02-02 ENCOUNTER — Other Ambulatory Visit
Admission: RE | Admit: 2022-02-02 | Discharge: 2022-02-02 | Disposition: A | Discharge status: Home / Self Care | Payer: HMO | Source: Ambulatory Visit | Attending: Physician Assistant | Admitting: Physician Assistant

## 2022-02-02 ENCOUNTER — Ambulatory Visit: Payer: HMO | Attending: Physician Assistant | Admitting: Physician Assistant

## 2022-02-02 ENCOUNTER — Telehealth: Payer: Self-pay

## 2022-02-02 ENCOUNTER — Telehealth: Payer: Self-pay | Admitting: Cardiovascular Disease

## 2022-02-02 VITALS — BP 134/82 | HR 82 | Ht 69.0 in | Wt 197.0 lb

## 2022-02-02 DIAGNOSIS — E785 Hyperlipidemia, unspecified: Secondary | ICD-10-CM

## 2022-02-02 DIAGNOSIS — I471 Supraventricular tachycardia: Secondary | ICD-10-CM

## 2022-02-02 DIAGNOSIS — I25110 Atherosclerotic heart disease of native coronary artery with unstable angina pectoris: Secondary | ICD-10-CM | POA: Diagnosis not present

## 2022-02-02 DIAGNOSIS — I639 Cerebral infarction, unspecified: Secondary | ICD-10-CM

## 2022-02-02 DIAGNOSIS — I10 Essential (primary) hypertension: Secondary | ICD-10-CM

## 2022-02-02 DIAGNOSIS — N183 Chronic kidney disease, stage 3 unspecified: Secondary | ICD-10-CM

## 2022-02-02 LAB — BASIC METABOLIC PANEL
Anion gap: 9 (ref 5–15)
BUN: 20 mg/dL (ref 8–23)
CO2: 31 mmol/L (ref 22–32)
Calcium: 9 mg/dL (ref 8.9–10.3)
Chloride: 100 mmol/L (ref 98–111)
Creatinine, Ser: 1.88 mg/dL — ABNORMAL HIGH (ref 0.61–1.24)
GFR, Estimated: 37 mL/min — ABNORMAL LOW (ref >60)
Glucose, Bld: 96 mg/dL (ref 70–99)
Potassium: 4.3 mmol/L (ref 3.5–5.1)
Sodium: 140 mmol/L (ref 135–145)

## 2022-02-02 MED ORDER — NITROGLYCERIN 0.4 MG SL SUBL: 0.4000 mg | SUBLINGUAL_TABLET | SUBLINGUAL | 0 refills | Status: DC | PRN

## 2022-02-02 MED ORDER — HYDRALAZINE HCL 10 MG PO TABS: 10.0000 mg | ORAL_TABLET | Freq: Three times a day (TID) | ORAL | 0 refills | Status: DC | PRN

## 2022-02-02 NOTE — Patient Instructions (Signed)
Medication Instructions:  NONE *If you need a refill on your cardiac medications before your next appointment, please call your pharmacy*   Lab Work: Your physician recommends that you return for lab work in:   Go to [facility] today for BMET  If you have labs (blood work) drawn today and your tests are completely normal, you will receive your results only by: [place] (if you have MyChart) OR A paper copy in the mail If you have any lab test that is abnormal or we need to change your treatment, we will call you to review the results.   Testing/Procedures:   William Mueller  02/02/2022  You are scheduled for a Cardiac Catheterization on Friday, September 1 with Dr. Kathlyn [place].  1. Please arrive at the [place] @ [facility] at    We will call you verbally with either 8:30 am or 9:30 am check-in for cardiac procedure   Special note: Every effort is made to have your procedure done on time. Please understand that emergencies sometimes delay scheduled procedures.  2. Diet: Do not eat solid foods after midnight.  The patient may have clear liquids until 5am upon the day of the procedure.  3. Labs: You will need to have blood drawn on Tuesday, August 29 at [facility], Go to 1st desk on your right to register.  Address: [address] [place],  [address]  Open: 8am - 5pm  Phone: [phone]. You do not need to be fasting.  4. Medication instructions in preparation for your procedure:   Contrast Allergy: No   -Please hold Furosemide the day of procedure.   On the morning of your procedure, take your Aspirin/Plavix/Clopidogrel and any morning medicines NOT listed above.  You may use sips of water.  5. Plan for one night stay--bring personal belongings. 6. Bring a current list of your medications and current insurance cards. 7. You MUST have a responsible person to drive you home. 8. Someone MUST be with you the first 24 hours after you arrive  home or your discharge will be delayed. 9. Please wear clothes that are easy to get on and off and wear slip-on shoes.  Thank you for allowing [place] to care for you!   -- [facility]    Follow-Up: At [facility], you and your health needs are our priority.  As part of our continuing mission to provide you with exceptional heart care, we have created designated Provider Care Teams.  These Care Teams include your primary Cardiologist (physician) and Advanced Practice Providers (APPs -  Physician Assistants and Nurse Practitioners) who all work together to provide you with the care you need, when you need it.  We recommend signing up for the patient portal called "MyChart".  Sign up information is provided on this After Visit Summary.  MyChart is used to connect with patients for Virtual Visits (Telemedicine).  Patients are able to view lab/test results, encounter notes, upcoming appointments, etc.  Non-urgent messages can be sent to your provider as well.   To learn more about what you can do with MyChart, go to [email or web address].    Your next appointment:   2 week(s)  The format for your next appointment:   In Person  Provider:   You may see Ida Rogue, MD or one of the following Advanced Practice Providers on your designated Care Team:   Murray Hodgkins, NP Christell Faith, PA-C Cadence Kathlen Mody, PA-C Gerrie Nordmann, NP  Important Information About Sugar

## 2022-02-02 NOTE — Telephone Encounter (Signed)
Caller stated patient came to get labwork done but BMET orders not yet received.  Patient is at the lab now.

## 2022-02-02 NOTE — Telephone Encounter (Signed)
Bmet completed today. Will close encounter.

## 2022-02-02 NOTE — Progress Notes (Signed)
Cardiology Office Note    Date:  02/02/2022   ID:  SILVER PARKEY, DOB 1951/03/13, MRN [id number]  PCP:  Leonides Sake, MD  Cardiologist:  Ida Rogue, MD  Electrophysiologist:  None   Chief Complaint: Hospital follow-up  History of Present Illness:   William Mueller is a 71 y.o. male with history of CAD status post PCI to the RCA in 12/2013 with chronic chest pain status post multiple repeat caths as outlined below, PSVT, CKD stage III, cryptogenic stroke s/p ILR, COPD secondary to long history of tobacco use with e-cigarette use on home oxygen, HFpEF, paroxysmal atrial tachycardia, labile hypertension with orthostatic hypotension, anemia, depression, anxiety, HTN, HLD, and GERD who presents for hospital follow-up as outlined below.  Admitted to [facility] in 12/2013 with chest pain. Underwent LHC on /01/2014 that showed left main normal, mid LAD diffuse 20%, ostial D1 50%, prox D1 50%, D2 30%, mid LCx moderately calcified with 30%, RCA mildly calcified with diffuse 30% proximally. The vessel was occluded in the midsegment. Faint left-to-right collaterals were noted. He underwent successful PCI/DES to the RCA. Echo showed EF 55-60%, mild LVH, GR1DD, inferior HK, elevated CVP, dilated IVC suggestive of elevated RA pressure. Admitted again in 02/2014 with chest pain, ruled out, left AMA. Admitted 02/2015 with chest pain. Ruled out. Underwent LHC that showed patent RCA stent with proximal RCA 55%, d1 80%, mid LCx to distal LCx 90%. Admitted 09/2015 with chest pain, ruled out, LHC showed patent RCA stent with unchanged nonobstructive disease when compared to prior LHC. FFR of RCA lesion not significant at 0.93. Medical management was advised. Echo in 09/2017 showed an EF of 65-70%, normal wall motion, Gr2DD. Most recently, he was admitted in 09/2018 with recurrent chest pain. Troponin minimally elevated peaking at 0.04. He underwent repeat LHC that showed left main without significant disease, D1 80% (small to  moderate size vessel), proximal LCx 30%, mid to distal LCx 90% (small vessel), proximal RCA 30% prior to the previously placed stent in the proximal to mid region without significant ISR. LVEF 55%. Medical management was advised. He was started on Ranexa 500 mg bid. In telehealth follow up he was feeling well. He stated "cathereization fixed me." He was reminded no intervention was performed and he was started on Ranexa. Following this, the patient stopped Ranexa on his own stating his chest pain was improved. He also noted a jittery feeling with this medication and possible difficulty in emptying his bladder.  Zio patch in 11/2018 showed NSR with an average heart rate of 104 bpm, 2 SVT runs occurred with the fastest interval lasting 6 beats with a maximum rate of 152 bpm and the longest interval lasting 8 beats with an average rate of 111 bpm, isolated PACS, atrial couplets, triplets, PVCs, and ventricular couplets were rare. Ventricular bigeminy was noted.  Follow up echo on 12/19/2018 showed an EF of 18-84%, diastolic dysfunction, no RWMA, normal RVSF, valves were not well visualized.   He was admitted in 05/2020 with a cryptogenic stroke.  Surface echo at that time demonstrated an EF of 55 to 60%, no regional wall motion abnormalities, grade 1 diastolic dysfunction, and normal RV systolic function/ventricular cavity size.  Zio patch showed a predominant rhythm of sinus rhythm with no evidence of A-fib/flutter or significant pauses.  8 episodes of SVT/atrial tachycardia were noted.  He subsequently underwent loop implantation with device interrogations dating back to 08/2020 showing no evidence of A-fib/flutter.  He was admitted to the  hospital from 8/25 through 01/30/2022 with chest pain that documentation indicates occurred with rated 10 out of 10 with associated diaphoresis and developed immediately after one of his favorite caregivers reported to him that she was being transferred and would no longer be  working with him, which made him upset.  High-sensitivity troponin 19 with a delta and peak troponin of 28.  He remained symptom-free in the hospital and was discharged to outpatient follow-up.  He woke up with chest tightness on 02/01/2022 with some improvement, though not resolution with SL NTG.  Home BP has ranged from the 622W to 979G systolic.  He comes in accompanied by his wife today and is currently without symptoms of angina or decompensation.  He notes an approximate 2 to 3-week history of worsening substernal chest pressure.  On 8/25, this chest pressure became severe, rated 10 out of 10, and persisted for approximately 1 hour with improvement with SL NTG.  Angina felt similar to what he experienced at the time of his MI.  There was associated shortness of breath.  Hospital work-up as outlined above.  He did have an episode of substernal chest tightness on 8/28 that improved with 1 SL NTG.  In the context of the above, he has had some elevated BP readings and has needed to take a as needed hydralazine.  No dizziness, presyncope, or syncope.  Currently chest pain-free.   Labs independently reviewed: 01/2022 - Hgb 11.5, PLT 250, potassium 3.9, BUN 24, serum creatinine 1.95 05/2020 - TC 98, TG 79, HDL 31, LDL 51, A1c 5.3, albumin 4.2, AST/ALT normal 08/2019 - TSH normal  Past Medical History:  Diagnosis Date   Anginal pain ([place]) 06/2017   Anxiety    Bronchitis 06/2017   Chronic chest pain    Chronic kidney disease (CKD) stage G3b/A1, moderately decreased glomerular filtration rate (GFR) between 30-44 mL/min/1.73 square meter and albuminuria creatinine ratio less than 30 mg/g (HCC)    Chronic lower back pain    WITH LEG WEAKNESS   COPD (chronic obstructive pulmonary disease) (HCC)    EMPHYSEMA, O2 AT 2L PRN   Coronary artery disease    a. 12/2013 PCI: mRCA 100% with L to R collats s/p PCI/DES;  b. 06/2014 MV: no ischemia/infarct, EF 53%;  c. 03/2015 Cath: LM nl, LAD nl, D1 80 (1.80m), LCX  937m<1.44m67m OM1 nl, RCA 55p, patent stent, RPDA nl, EF 65%; d. 09/2015 Cath: LM nl, LAD 33m87m 80 (<2mm)10mCX 58m/d27m.44mm), 43m nl, RCA 55p (FFR 0.93), patent stent, RPDA nl-->Med Rx.   Cryptogenic stroke (HCC) [place]/2021   Some right sided weakness and balance issues   Daily headache    Depression    Diastolic dysfunction    a. echo 12/2013: EF 55-60%, mild LVH, GR1DD, inf HK, elevated CVP, mildly dilated IVC suggestive of increased RA pressure   Dyspnea    WHEEZING   GERD (gastroesophageal reflux disease)    REFLUX   Hearing loss    High cholesterol    Hypertension    Myocardial infarction (HCC) [place]  Nicotine addiction    a. using eCigs.   Orthopnea    Sleep apnea    No CPAP   Status post placement of implantable loop recorder 07/16/2020   Vertigo     Past Surgical History:  Procedure Laterality Date   CARDIAC CATHETERIZATION     MC x 1 stent   CARDIAC CATHETERIZATION Left 03/12/2015   Procedure: Left Heart Cath and Coronary Angiography;  Surgeon: Leonie Man, MD;  Location: [place] [facility];  Service: Cardiovascular;  Laterality: Left;   CARDIAC CATHETERIZATION N/A 09/29/2015   Procedure: Left Heart Cath and Coronary Angiography;  Surgeon: Wellington Hampshire, MD;  Location: [place] [facility];  Service: Cardiovascular;  Laterality: N/A;   CATARACT EXTRACTION W/PHACO Right 08/10/2017   Procedure: CATARACT EXTRACTION PHACO AND INTRAOCULAR LENS PLACEMENT (Echo) RIGHT;  Surgeon: Leandrew Koyanagi, MD;  Location: [place];  Service: Ophthalmology;  Laterality: Right;   CATARACT EXTRACTION W/PHACO Left 10/15/2020   Procedure: CATARACT EXTRACTION PHACO AND INTRAOCULAR LENS PLACEMENT ([place]) LEFT;  Surgeon: Leandrew Koyanagi, MD;  Location: [place];  Service: Ophthalmology;  Laterality: Left;  sleep apnea CDE 15.52 1:55.5 minutes 13.5%   CORONARY ANGIOPLASTY     STENT PLACEMENT   LEFT HEART CATH AND CORONARY ANGIOGRAPHY N/A 09/06/2018   Procedure:  LEFT HEART CATH AND CORONARY ANGIOGRAPHY;  Surgeon: Minna Merritts, MD;  Location: [place] [facility];  Service: Cardiovascular;  Laterality: N/A;   LEFT HEART CATHETERIZATION WITH CORONARY ANGIOGRAM N/A 12/12/2013   Procedure: LEFT HEART CATHETERIZATION WITH CORONARY ANGIOGRAM;  Surgeon: Wellington Hampshire, MD;  Location: [place] [facility];  Service: Cardiovascular;  Laterality: N/A;    Current Medications: Current Meds  Medication Sig   acetaminophen (TYLENOL) 500 MG tablet Take 1,000 mg by mouth daily as needed for headache.    ALPRAZolam (XANAX XR) 2 MG 24 hr tablet Take 2 mg by mouth every morning.   aspirin 81 MG EC tablet Take 1 tablet (81 mg total) by mouth daily.   atorvastatin (LIPITOR) 80 MG tablet Take 1 tablet by mouth once daily   clopidogrel (PLAVIX) 75 MG tablet Take 1 tablet by mouth once daily   COMBIVENT RESPIMAT 20-100 MCG/ACT AERS respimat INHALE 1 PUFF BY MOUTH 4 TIMES DAILY AS NEEDED FOR WHEEZING   ezetimibe (ZETIA) 10 MG tablet Take 1 tablet by mouth once daily   Fluticasone-Umeclidin-Vilant (TRELEGY ELLIPTA) 100-62.5-25 MCG/ACT AEPB Inhale 1 puff into the lungs daily.   furosemide (LASIX) 20 MG tablet Take 20 mg by mouth daily.    KLOR-CON M20 20 MEQ tablet Take 20 mEq by mouth daily.   metoprolol succinate (TOPROL-XL) 100 MG 24 hr tablet TAKE 1 TABLET BY MOUTH ONCE DAILY WITH  OR  IMMEDIATELY  FOLLOWING  A  MEAL   OXYGEN Inhale 2 L into the lungs daily as needed (oxygen).    pantoprazole (PROTONIX) 40 MG tablet Take 40 mg by mouth daily.   predniSONE (DELTASONE) 5 MG tablet TAKE 1 TABLET BY MOUTH ONCE DAILY FOR 90 DAYS   roflumilast (DALIRESP) 500 MCG TABS tablet Take 1 tablet (500 mcg total) by mouth daily.   sulfamethoxazole-trimethoprim (BACTRIM) 400-80 MG tablet TAKE 1 TABLET BY MOUTH 3 TIMES WEEKLY   [DISCONTINUED] hydrALAZINE (APRESOLINE) 10 MG tablet Take 10 mg by mouth once. 02-02-2022   [DISCONTINUED] nitroGLYCERIN (NITROSTAT) 0.4 MG SL tablet Place 1 tablet (0.4  mg total) under the tongue every 5 (five) minutes as needed for chest pain.    Allergies:   Paroxetine hcl, Serotonin reuptake inhibitors (ssris), Diazepam, Doxycycline monohydrate, Escitalopram oxalate, Lorazepam, and Tetracyclines & related   Social History   Socioeconomic History   Marital status: Married    Spouse name: Not on file   Number of children: Not on file   Years of education: Not on file   Highest education level: Not on file  Occupational History   Not on file  Tobacco Use   Smoking status: Former    Packs/day: 3.00    Years: 48.00    Total pack years: 144.00    Types: E-cigarettes, Cigarettes    Quit date: 06/17/2009    Years since quitting: 12.6   Smokeless tobacco: Never   Tobacco comments:    quit e-cigs 03/2020  Vaping Use   Vaping Use: Former   Quit date: 04/15/2020  Substance and Sexual Activity   Alcohol use: Yes    Alcohol/week: 6.0 standard drinks of alcohol    Types: 6 Cans of beer per week    Comment: Occasional    Drug use: No   Sexual activity: Not Currently  Other Topics Concern   Not on file  Social History Narrative   Not on file   Social Determinants of Health   Financial Resource Strain: Not on file  Food Insecurity: Not on file  Transportation Needs: Not on file  Physical Activity: Not on file  Stress: Not on file  Social Connections: Not on file     Family History:  The patient's family history includes CVA in his mother; Coronary artery disease in his brother and father; Heart disease in his father; Heart disease (age of onset: 28) in his brother. There is no history of Kidney disease or Prostate cancer.  ROS:   12-point review of systems is negative unless otherwise noted in the HPI.   EKGs/Labs/Other Studies Reviewed:    Studies reviewed were summarized above. The additional studies were reviewed today:  Zio patch 05/2020: Normal sinus rhythm No patient triggered events min HR of 45 bpm, max HR of 138 bpm, and avg  HR of 66 bpm.   8 Supraventricular Tachycardia/atrial tachycardia runs occurred, the run with the fastest interval lasting 5 beats with a max rate of 138 bpm, the longest lasting 13 beats with an avg rate of 110 bpm.    Possible Junctional Rhythm was present.  Isolated SVEs were rare (<1.0%), SVE Couplets were rare (<1.0%), and SVE Triplets were rare (<1.0%). Isolated VEs were rare (<1.0%, 41), VE Couplets were rare (<1.0%, 3), and VE Triplets were rare (<1.0%, 2). __________  2D echo 05/08/2020: 1. Left ventricular ejection fraction, by estimation, is 55 to 60%. The  left ventricle has normal function. The left ventricle has no regional  wall motion abnormalities. Left ventricular diastolic parameters are  consistent with Grade I diastolic  dysfunction (impaired relaxation).   2. Right ventricular systolic function is normal. The right ventricular  size is normal.  _________  2D Echo 12/19/2018: 1. The left ventricle has normal systolic function with an ejection fraction of 60-65%. The cavity size was normal. There is mildly increased left ventricular wall thickness. Left ventricular diastolic Doppler parameters are consistent with impaired  relaxation. No evidence of left ventricular regional wall motion abnormalities.  2. The right ventricle has normal systolic function. The cavity was normal. There is no increase in right ventricular wall thickness. Right ventricular systolic pressure could not be assessed.  3. The aortic valve was not well visualized. __________   [facility]wyn Reach monitor 11/2018: Event Monitor   Normal sinus rhythm Avg HR of 104 bpm.   2 Supraventricular Tachycardia runs occurred, the run with the fastest interval lasting 6 beats with a max rate of 152 bpm,  the longest lasting 8 beats with an avg rate of 111 bpm.    Isolated SVEs were rare (<1.0%), SVE Couplets were rare (<1.0%), and SVE Triplets were rare (<1.0%). Isolated VEs  were rare (<1.0%), VE Couplets were rare  (<1.0%), and no VE Triplets were present. Ventricular Bigeminy was present.   Patient triggered events were not associated with significant arrhythmia __________   [facility] 09/2018: Coronary angiography:  Coronary dominance: Right or codominant  Left mainstem:   Large vessel that bifurcates into the LAD and left circumflex, no significant disease noted  Left anterior descending (LAD):   Large vessel that extends to the apical region, diagonal branch 2, first diagonal branch is a small to moderate size vessel, severe diffuse disease predominantly in the proximal region estimated at 80%, second diagonal branch moderate to large size vessel no significant disease  Left circumflex (LCx):  Large vessel with OM branch 2, 30% mid left circumflex disease, distal left circumflex after takeoff of OM 2 is severely diseased, small vessel.  There is a large OM branch with no significant disease  Right coronary artery (RCA):  Right dominant vessel with PL and PDA, no significant disease noted.  30% proximal RCA disease prior to long stent in the proximal to mid region, stent with no significant in-stent restenosis  Left ventriculography: Left ventricular systolic function is normal, LVEF is estimated at 55%, there is no significant mitral regurgitation , no significant aortic valve stenosis  Final Conclusions:   Significant disease of a small diagonal branch and small distal left circumflex branches Mean branches of the RCA, left circumflex, LAD and large diagonal are intact with no significant disease  Recommendations:  Etiology of his chest pain could be secondary to small vessel disease Pictures reviewed by interventional cardiology Medical management recommended We will recommend he start Ranexa 500 mg twice daily for 1 week then up to 1000 mg twice daily Continue other outpatient medications  EKG:  EKG is ordered today.  The EKG ordered today demonstrates NSR, 82 bpm, significant baseline  wandering, anterolateral T wave inversion  Recent Labs: 01/30/2022: Hemoglobin 11.5; Platelets 250 02/02/2022: BUN 20; Creatinine, Ser 1.88; Potassium 4.3; Sodium 140  Recent Lipid Panel    Component Value Date/Time   CHOL 98 05/08/2020 0435   CHOL 114 07/23/2019 1155   TRIG 79 05/08/2020 0435   HDL 31 (L) 05/08/2020 0435   HDL 38 (L) 07/23/2019 1155   CHOLHDL 3.2 05/08/2020 0435   VLDL 16 05/08/2020 0435   LDLCALC 51 05/08/2020 0435   LDLCALC 53 07/23/2019 1155    PHYSICAL EXAM:    VS:  BP 134/82 (BP Location: Left Arm, Patient Position: Sitting, Cuff Size: Large)   Pulse 82   Ht '5\' 9"'$  (1.753 m)   Wt 197 lb (89.4 kg)   SpO2 99% Comment: 2 liters  BMI 29.09 kg/m   BMI: Body mass index is 29.09 kg/m.  Physical Exam Vitals reviewed.  Constitutional:      Appearance: He is well-developed.  HENT:     Head: Normocephalic and atraumatic.  Eyes:     General:        Right eye: No discharge.        Left eye: No discharge.  Neck:     Vascular: No JVD.  Cardiovascular:     Rate and Rhythm: Normal rate and regular rhythm.     Heart sounds: Normal heart sounds, S1 normal and S2 normal. Heart sounds not distant. No midsystolic click and no opening snap. No murmur heard.    No friction rub.  Pulmonary:     Effort: Pulmonary effort is normal. No respiratory distress.     Breath sounds: Normal breath  sounds. No decreased breath sounds, wheezing or rales.     Comments: Supplemental oxygen via nasal cannula noted. Chest:     Chest wall: No tenderness.  Abdominal:     General: There is no distension.  Musculoskeletal:     Cervical back: Normal range of motion.     Right lower leg: No edema.     Left lower leg: No edema.  Skin:    General: Skin is warm and dry.     Nails: There is no clubbing.  Neurological:     Mental Status: He is alert and oriented to person, place, and time.  Psychiatric:        Speech: Speech normal.        Behavior: Behavior normal.        Thought  Content: Thought content normal.        Judgment: Judgment normal.     Wt Readings from Last 3 Encounters:  02/02/22 197 lb (89.4 kg)  01/29/22 191 lb (86.6 kg)  11/23/21 192 lb 6 oz (87.3 kg)     ASSESSMENT & PLAN:   CAD involving the native coronary arteries with accelerating angina, elevated high-sensitivity troponin, and abnormal EKG: Currently chest pain-free.  We discussed invasive versus noninvasive ischemic testing.  He declines Lexiscan MPI.  Proceed with diagnostic LHC given accelerating anginal symptoms, elevated high-sensitivity troponin, and new/progressive anterolateral T wave inversion on EKG.  Continue aggressive risk factor modification and secondary prevention including aspirin, clopidogrel, atorvastatin, ezetimibe, metoprolol succinate, and as needed SL NTG.  PSVT/atrial tachycardia: Quiescent.  He remains on Toprol-XL.  HTN: Blood pressure is well controlled in the office today.  Continue current medical therapy including as needed hydralazine.  HLD: LDL 51.  He remains on atorvastatin 80 mg and ezetimibe.  Cryptogenic stroke: No new deficits.  No evidence of A-fib/flutter on ILR interrogation.  He remains on DAPT as outlined above.  CKD stage III: Check BMP leading up to [facility].  Orders were placed for the patient to receive precath IV fluids at 125 mL/h, starting 4 hours prior to case.   Shared Decision Making/Informed Consent{  The risks [stroke (1 in 1000), death (1 in 1000), kidney failure [usually temporary] (1 in 500), bleeding (1 in 200), allergic reaction [possibly serious] (1 in 200)], benefits (diagnostic support and management of coronary artery disease) and alternatives of a cardiac catheterization were discussed in detail with Mr. Partain and he is willing to proceed.    Disposition: F/u with Dr. Rockey Situ or an APP 1 to 2 weeks post cath.   Medication Adjustments/Labs and Tests Ordered: Current medicines are reviewed at length with the patient today.   Concerns regarding medicines are outlined above. Medication changes, Labs and Tests ordered today are summarized above and listed in the Patient Instructions accessible in Encounters.   Signed, Christell Faith, PA-C 02/02/2022 3:07 PM     [address], [place] [address] [phone]

## 2022-02-02 NOTE — H&P (View-Only) (Signed)
Cardiology Office Note    Date:  02/02/2022   ID:  William Mueller, DOB 1951/04/08, MRN [id number]  PCP:  Leonides Sake, MD  Cardiologist:  Ida Rogue, MD  Electrophysiologist:  None   Chief Complaint: Hospital follow-up  History of Present Illness:   William Mueller is a 71 y.o. male with history of CAD status post PCI to the RCA in 12/2013 with chronic chest pain status post multiple repeat caths as outlined below, PSVT, CKD stage III, cryptogenic stroke s/p ILR, COPD secondary to long history of tobacco use with e-cigarette use on home oxygen, HFpEF, paroxysmal atrial tachycardia, labile hypertension with orthostatic hypotension, anemia, depression, anxiety, HTN, HLD, and GERD who presents for hospital follow-up as outlined below.  Admitted to [facility] in 12/2013 with chest pain. Underwent LHC on /01/2014 that showed left main normal, mid LAD diffuse 20%, ostial D1 50%, prox D1 50%, D2 30%, mid LCx moderately calcified with 30%, RCA mildly calcified with diffuse 30% proximally. The vessel was occluded in the midsegment. Faint left-to-right collaterals were noted. He underwent successful PCI/DES to the RCA. Echo showed EF 55-60%, mild LVH, GR1DD, inferior HK, elevated CVP, dilated IVC suggestive of elevated RA pressure. Admitted again in 02/2014 with chest pain, ruled out, left AMA. Admitted 02/2015 with chest pain. Ruled out. Underwent LHC that showed patent RCA stent with proximal RCA 55%, d1 80%, mid LCx to distal LCx 90%. Admitted 09/2015 with chest pain, ruled out, LHC showed patent RCA stent with unchanged nonobstructive disease when compared to prior LHC. FFR of RCA lesion not significant at 0.93. Medical management was advised. Echo in 09/2017 showed an EF of 65-70%, normal wall motion, Gr2DD. Most recently, he was admitted in 09/2018 with recurrent chest pain. Troponin minimally elevated peaking at 0.04. He underwent repeat LHC that showed left main without significant disease, D1 80% (small to  moderate size vessel), proximal LCx 30%, mid to distal LCx 90% (small vessel), proximal RCA 30% prior to the previously placed stent in the proximal to mid region without significant ISR. LVEF 55%. Medical management was advised. He was started on Ranexa 500 mg bid. In telehealth follow up he was feeling well. He stated "cathereization fixed me." He was reminded no intervention was performed and he was started on Ranexa. Following this, the patient stopped Ranexa on his own stating his chest pain was improved. He also noted a jittery feeling with this medication and possible difficulty in emptying his bladder.  Zio patch in 11/2018 showed NSR with an average heart rate of 104 bpm, 2 SVT runs occurred with the fastest interval lasting 6 beats with a maximum rate of 152 bpm and the longest interval lasting 8 beats with an average rate of 111 bpm, isolated PACS, atrial couplets, triplets, PVCs, and ventricular couplets were rare. Ventricular bigeminy was noted.  Follow up echo on 12/19/2018 showed an EF of 68-34%, diastolic dysfunction, no RWMA, normal RVSF, valves were not well visualized.   He was admitted in 05/2020 with a cryptogenic stroke.  Surface echo at that time demonstrated an EF of 55 to 60%, no regional wall motion abnormalities, grade 1 diastolic dysfunction, and normal RV systolic function/ventricular cavity size.  Zio patch showed a predominant rhythm of sinus rhythm with no evidence of A-fib/flutter or significant pauses.  8 episodes of SVT/atrial tachycardia were noted.  He subsequently underwent loop implantation with device interrogations dating back to 08/2020 showing no evidence of A-fib/flutter.  He was admitted to the  hospital from 8/25 through 01/30/2022 with chest pain that documentation indicates occurred with rated 10 out of 10 with associated diaphoresis and developed immediately after one of his favorite caregivers reported to him that she was being transferred and would no longer be  working with him, which made him upset.  High-sensitivity troponin 19 with a delta and peak troponin of 28.  He remained symptom-free in the hospital and was discharged to outpatient follow-up.  He woke up with chest tightness on 02/01/2022 with some improvement, though not resolution with SL NTG.  Home BP has ranged from the 098J to 191Y systolic.  He comes in accompanied by his wife today and is currently without symptoms of angina or decompensation.  He notes an approximate 2 to 3-week history of worsening substernal chest pressure.  On 8/25, this chest pressure became severe, rated 10 out of 10, and persisted for approximately 1 hour with improvement with SL NTG.  Angina felt similar to what he experienced at the time of his MI.  There was associated shortness of breath.  Hospital work-up as outlined above.  He did have an episode of substernal chest tightness on 8/28 that improved with 1 SL NTG.  In the context of the above, he has had some elevated BP readings and has needed to take a as needed hydralazine.  No dizziness, presyncope, or syncope.  Currently chest pain-free.   Labs independently reviewed: 01/2022 - Hgb 11.5, PLT 250, potassium 3.9, BUN 24, serum creatinine 1.95 05/2020 - TC 98, TG 79, HDL 31, LDL 51, A1c 5.3, albumin 4.2, AST/ALT normal 08/2019 - TSH normal  Past Medical History:  Diagnosis Date   Anginal pain ([place]) 06/2017   Anxiety    Bronchitis 06/2017   Chronic chest pain    Chronic kidney disease (CKD) stage G3b/A1, moderately decreased glomerular filtration rate (GFR) between 30-44 mL/min/1.73 square meter and albuminuria creatinine ratio less than 30 mg/g (HCC)    Chronic lower back pain    WITH LEG WEAKNESS   COPD (chronic obstructive pulmonary disease) (HCC)    EMPHYSEMA, O2 AT 2L PRN   Coronary artery disease    a. 12/2013 PCI: mRCA 100% with L to R collats s/p PCI/DES;  b. 06/2014 MV: no ischemia/infarct, EF 53%;  c. 03/2015 Cath: LM nl, LAD nl, D1 80 (1.89m), LCX  943m<1.43m32m OM1 nl, RCA 55p, patent stent, RPDA nl, EF 65%; d. 09/2015 Cath: LM nl, LAD 26m29m 80 (<2mm)63mCX 54m/d58m.43mm), 33m nl, RCA 55p (FFR 0.93), patent stent, RPDA nl-->Med Rx.   Cryptogenic stroke (HCC) [place]/2021   Some right sided weakness and balance issues   Daily headache    Depression    Diastolic dysfunction    a. echo 12/2013: EF 55-60%, mild LVH, GR1DD, inf HK, elevated CVP, mildly dilated IVC suggestive of increased RA pressure   Dyspnea    WHEEZING   GERD (gastroesophageal reflux disease)    REFLUX   Hearing loss    High cholesterol    Hypertension    Myocardial infarction (HCC) [place]  Nicotine addiction    a. using eCigs.   Orthopnea    Sleep apnea    No CPAP   Status post placement of implantable loop recorder 07/16/2020   Vertigo     Past Surgical History:  Procedure Laterality Date   CARDIAC CATHETERIZATION     MC x 1 stent   CARDIAC CATHETERIZATION Left 03/12/2015   Procedure: Left Heart Cath and Coronary Angiography;  Surgeon: Leonie Man, MD;  Location: [place] [facility];  Service: Cardiovascular;  Laterality: Left;   CARDIAC CATHETERIZATION N/A 09/29/2015   Procedure: Left Heart Cath and Coronary Angiography;  Surgeon: Wellington Hampshire, MD;  Location: [place] [facility];  Service: Cardiovascular;  Laterality: N/A;   CATARACT EXTRACTION W/PHACO Right 08/10/2017   Procedure: CATARACT EXTRACTION PHACO AND INTRAOCULAR LENS PLACEMENT ([place]) RIGHT;  Surgeon: Leandrew Koyanagi, MD;  Location: [facility];  Service: Ophthalmology;  Laterality: Right;   CATARACT EXTRACTION W/PHACO Left 10/15/2020   Procedure: CATARACT EXTRACTION PHACO AND INTRAOCULAR LENS PLACEMENT ([place]) LEFT;  Surgeon: Leandrew Koyanagi, MD;  Location: [facility];  Service: Ophthalmology;  Laterality: Left;  sleep apnea CDE 15.52 1:55.5 minutes 13.5%   CORONARY ANGIOPLASTY     STENT PLACEMENT   LEFT HEART CATH AND CORONARY ANGIOGRAPHY N/A 09/06/2018   Procedure:  LEFT HEART CATH AND CORONARY ANGIOGRAPHY;  Surgeon: Minna Merritts, MD;  Location: [place];  Service: Cardiovascular;  Laterality: N/A;   LEFT HEART CATHETERIZATION WITH CORONARY ANGIOGRAM N/A 12/12/2013   Procedure: LEFT HEART CATHETERIZATION WITH CORONARY ANGIOGRAM;  Surgeon: Wellington Hampshire, MD;  Location: [facility];  Service: Cardiovascular;  Laterality: N/A;    Current Medications: Current Meds  Medication Sig   acetaminophen (TYLENOL) 500 MG tablet Take 1,000 mg by mouth daily as needed for headache.    ALPRAZolam (XANAX XR) 2 MG 24 hr tablet Take 2 mg by mouth every morning.   aspirin 81 MG EC tablet Take 1 tablet (81 mg total) by mouth daily.   atorvastatin (LIPITOR) 80 MG tablet Take 1 tablet by mouth once daily   clopidogrel (PLAVIX) 75 MG tablet Take 1 tablet by mouth once daily   COMBIVENT RESPIMAT 20-100 MCG/ACT AERS respimat INHALE 1 PUFF BY MOUTH 4 TIMES DAILY AS NEEDED FOR WHEEZING   ezetimibe (ZETIA) 10 MG tablet Take 1 tablet by mouth once daily   Fluticasone-Umeclidin-Vilant (TRELEGY ELLIPTA) 100-62.5-25 MCG/ACT AEPB Inhale 1 puff into the lungs daily.   furosemide (LASIX) 20 MG tablet Take 20 mg by mouth daily.    KLOR-CON M20 20 MEQ tablet Take 20 mEq by mouth daily.   metoprolol succinate (TOPROL-XL) 100 MG 24 hr tablet TAKE 1 TABLET BY MOUTH ONCE DAILY WITH  OR  IMMEDIATELY  FOLLOWING  A  MEAL   OXYGEN Inhale 2 L into the lungs daily as needed (oxygen).    pantoprazole (PROTONIX) 40 MG tablet Take 40 mg by mouth daily.   predniSONE (DELTASONE) 5 MG tablet TAKE 1 TABLET BY MOUTH ONCE DAILY FOR 90 DAYS   roflumilast (DALIRESP) 500 MCG TABS tablet Take 1 tablet (500 mcg total) by mouth daily.   sulfamethoxazole-trimethoprim (BACTRIM) 400-80 MG tablet TAKE 1 TABLET BY MOUTH 3 TIMES WEEKLY   [DISCONTINUED] hydrALAZINE (APRESOLINE) 10 MG tablet Take 10 mg by mouth once. 02-02-2022   [DISCONTINUED] nitroGLYCERIN (NITROSTAT) 0.4 MG SL tablet Place 1 tablet (0.4  mg total) under the tongue every 5 (five) minutes as needed for chest pain.    Allergies:   Paroxetine hcl, Serotonin reuptake inhibitors (ssris), Diazepam, Doxycycline monohydrate, Escitalopram oxalate, Lorazepam, and Tetracyclines & related   Social History   Socioeconomic History   Marital status: Married    Spouse name: Not on file   Number of children: Not on file   Years of education: Not on file   Highest education level: Not on file  Occupational History   Not on file  Tobacco Use   Smoking status: Former    Packs/day: 3.00    Years: 48.00    Total pack years: 144.00    Types: E-cigarettes, Cigarettes    Quit date: 06/17/2009    Years since quitting: 12.6   Smokeless tobacco: Never   Tobacco comments:    quit e-cigs 03/2020  Vaping Use   Vaping Use: Former   Quit date: 04/15/2020  Substance and Sexual Activity   Alcohol use: Yes    Alcohol/week: 6.0 standard drinks of alcohol    Types: 6 Cans of beer per week    Comment: Occasional    Drug use: No   Sexual activity: Not Currently  Other Topics Concern   Not on file  Social History Narrative   Not on file   Social Determinants of Health   Financial Resource Strain: Not on file  Food Insecurity: Not on file  Transportation Needs: Not on file  Physical Activity: Not on file  Stress: Not on file  Social Connections: Not on file     Family History:  The patient's family history includes CVA in his mother; Coronary artery disease in his brother and father; Heart disease in his father; Heart disease (age of onset: 18) in his brother. There is no history of Kidney disease or Prostate cancer.  ROS:   12-point review of systems is negative unless otherwise noted in the HPI.   EKGs/Labs/Other Studies Reviewed:    Studies reviewed were summarized above. The additional studies were reviewed today:  Zio patch 05/2020: Normal sinus rhythm No patient triggered events min HR of 45 bpm, max HR of 138 bpm, and avg  HR of 66 bpm.   8 Supraventricular Tachycardia/atrial tachycardia runs occurred, the run with the fastest interval lasting 5 beats with a max rate of 138 bpm, the longest lasting 13 beats with an avg rate of 110 bpm.    Possible Junctional Rhythm was present.  Isolated SVEs were rare (<1.0%), SVE Couplets were rare (<1.0%), and SVE Triplets were rare (<1.0%). Isolated VEs were rare (<1.0%, 41), VE Couplets were rare (<1.0%, 3), and VE Triplets were rare (<1.0%, 2). __________  2D echo 05/08/2020: 1. Left ventricular ejection fraction, by estimation, is 55 to 60%. The  left ventricle has normal function. The left ventricle has no regional  wall motion abnormalities. Left ventricular diastolic parameters are  consistent with Grade I diastolic  dysfunction (impaired relaxation).   2. Right ventricular systolic function is normal. The right ventricular  size is normal.  _________  2D Echo 12/19/2018: 1. The left ventricle has normal systolic function with an ejection fraction of 60-65%. The cavity size was normal. There is mildly increased left ventricular wall thickness. Left ventricular diastolic Doppler parameters are consistent with impaired  relaxation. No evidence of left ventricular regional wall motion abnormalities.  2. The right ventricle has normal systolic function. The cavity was normal. There is no increase in right ventricular wall thickness. Right ventricular systolic pressure could not be assessed.  3. The aortic valve was not well visualized. __________   Elwyn Reach monitor 11/2018: Event Monitor   Normal sinus rhythm Avg HR of 104 bpm.   2 Supraventricular Tachycardia runs occurred, the run with the fastest interval lasting 6 beats with a max rate of 152 bpm,  the longest lasting 8 beats with an avg rate of 111 bpm.    Isolated SVEs were rare (<1.0%), SVE Couplets were rare (<1.0%), and SVE Triplets were rare (<1.0%). Isolated VEs  were rare (<1.0%), VE Couplets were rare  (<1.0%), and no VE Triplets were present. Ventricular Bigeminy was present.   Patient triggered events were not associated with significant arrhythmia __________   [facility] 09/2018: Coronary angiography:  Coronary dominance: Right or codominant  Left mainstem:   Large vessel that bifurcates into the LAD and left circumflex, no significant disease noted  Left anterior descending (LAD):   Large vessel that extends to the apical region, diagonal branch 2, first diagonal branch is a small to moderate size vessel, severe diffuse disease predominantly in the proximal region estimated at 80%, second diagonal branch moderate to large size vessel no significant disease  Left circumflex (LCx):  Large vessel with OM branch 2, 30% mid left circumflex disease, distal left circumflex after takeoff of OM 2 is severely diseased, small vessel.  There is a large OM branch with no significant disease  Right coronary artery (RCA):  Right dominant vessel with PL and PDA, no significant disease noted.  30% proximal RCA disease prior to long stent in the proximal to mid region, stent with no significant in-stent restenosis  Left ventriculography: Left ventricular systolic function is normal, LVEF is estimated at 55%, there is no significant mitral regurgitation , no significant aortic valve stenosis  Final Conclusions:   Significant disease of a small diagonal branch and small distal left circumflex branches Mean branches of the RCA, left circumflex, LAD and large diagonal are intact with no significant disease  Recommendations:  Etiology of his chest pain could be secondary to small vessel disease Pictures reviewed by interventional cardiology Medical management recommended We will recommend he start Ranexa 500 mg twice daily for 1 week then up to 1000 mg twice daily Continue other outpatient medications  EKG:  EKG is ordered today.  The EKG ordered today demonstrates NSR, 82 bpm, significant baseline  wandering, anterolateral T wave inversion  Recent Labs: 01/30/2022: Hemoglobin 11.5; Platelets 250 02/02/2022: BUN 20; Creatinine, Ser 1.88; Potassium 4.3; Sodium 140  Recent Lipid Panel    Component Value Date/Time   CHOL 98 05/08/2020 0435   CHOL 114 07/23/2019 1155   TRIG 79 05/08/2020 0435   HDL 31 (L) 05/08/2020 0435   HDL 38 (L) 07/23/2019 1155   CHOLHDL 3.2 05/08/2020 0435   VLDL 16 05/08/2020 0435   LDLCALC 51 05/08/2020 0435   LDLCALC 53 07/23/2019 1155    PHYSICAL EXAM:    VS:  BP 134/82 (BP Location: Left Arm, Patient Position: Sitting, Cuff Size: Large)   Pulse 82   Ht '5\' 9"'$  (1.753 m)   Wt 197 lb (89.4 kg)   SpO2 99% Comment: 2 liters  BMI 29.09 kg/m   BMI: Body mass index is 29.09 kg/m.  Physical Exam Vitals reviewed.  Constitutional:      Appearance: He is well-developed.  HENT:     Head: Normocephalic and atraumatic.  Eyes:     General:        Right eye: No discharge.        Left eye: No discharge.  Neck:     Vascular: No JVD.  Cardiovascular:     Rate and Rhythm: Normal rate and regular rhythm.     Heart sounds: Normal heart sounds, S1 normal and S2 normal. Heart sounds not distant. No midsystolic click and no opening snap. No murmur heard.    No friction rub.  Pulmonary:     Effort: Pulmonary effort is normal. No respiratory distress.     Breath sounds: Normal breath  sounds. No decreased breath sounds, wheezing or rales.     Comments: Supplemental oxygen via nasal cannula noted. Chest:     Chest wall: No tenderness.  Abdominal:     General: There is no distension.  Musculoskeletal:     Cervical back: Normal range of motion.     Right lower leg: No edema.     Left lower leg: No edema.  Skin:    General: Skin is warm and dry.     Nails: There is no clubbing.  Neurological:     Mental Status: He is alert and oriented to person, place, and time.  Psychiatric:        Speech: Speech normal.        Behavior: Behavior normal.        Thought  Content: Thought content normal.        Judgment: Judgment normal.     Wt Readings from Last 3 Encounters:  02/02/22 197 lb (89.4 kg)  01/29/22 191 lb (86.6 kg)  11/23/21 192 lb 6 oz (87.3 kg)     ASSESSMENT & PLAN:   CAD involving the native coronary arteries with accelerating angina, elevated high-sensitivity troponin, and abnormal EKG: Currently chest pain-free.  We discussed invasive versus noninvasive ischemic testing.  He declines Lexiscan MPI.  Proceed with diagnostic LHC given accelerating anginal symptoms, elevated high-sensitivity troponin, and new/progressive anterolateral T wave inversion on EKG.  Continue aggressive risk factor modification and secondary prevention including aspirin, clopidogrel, atorvastatin, ezetimibe, metoprolol succinate, and as needed SL NTG.  PSVT/atrial tachycardia: Quiescent.  He remains on Toprol-XL.  HTN: Blood pressure is well controlled in the office today.  Continue current medical therapy including as needed hydralazine.  HLD: LDL 51.  He remains on atorvastatin 80 mg and ezetimibe.  Cryptogenic stroke: No new deficits.  No evidence of A-fib/flutter on ILR interrogation.  He remains on DAPT as outlined above.  CKD stage III: Check BMP leading up to [facility].  Orders were placed for the patient to receive precath IV fluids at 125 mL/h, starting 4 hours prior to case.   Shared Decision Making/Informed Consent{  The risks [stroke (1 in 1000), death (1 in 1000), kidney failure [usually temporary] (1 in 500), bleeding (1 in 200), allergic reaction [possibly serious] (1 in 200)], benefits (diagnostic support and management of coronary artery disease) and alternatives of a cardiac catheterization were discussed in detail with Mr. Feinstein and he is willing to proceed.    Disposition: F/u with Dr. Rockey Situ or an APP 1 to 2 weeks post cath.   Medication Adjustments/Labs and Tests Ordered: Current medicines are reviewed at length with the patient today.   Concerns regarding medicines are outlined above. Medication changes, Labs and Tests ordered today are summarized above and listed in the Patient Instructions accessible in Encounters.   Signed, Christell Faith, PA-C 02/02/2022 3:07 PM     [address] [place], [place] [address] [phone]

## 2022-02-02 NOTE — Telephone Encounter (Signed)
Called and spoke with patients wife per DPR on file and informed her that the patient will need to check in for his Cath Lab procedure on Fri 02/05/22 at 8:30. I explained that he will be getting 4 hours of pre-hydration prior to his scheduled procedure at 12:30. Patients wife verbalized understanding and agreed with plan.

## 2022-02-02 NOTE — Telephone Encounter (Signed)
See previous encounter

## 2022-02-05 ENCOUNTER — Encounter: Payer: Self-pay | Admitting: Cardiovascular Disease

## 2022-02-05 ENCOUNTER — Other Ambulatory Visit: Payer: Self-pay

## 2022-02-05 ENCOUNTER — Telehealth: Payer: Self-pay | Admitting: Cardiovascular Disease

## 2022-02-05 ENCOUNTER — Ambulatory Visit
Admission: RE | Admit: 2022-02-05 | Discharge: 2022-02-05 | Disposition: A | Discharge status: Home / Self Care | Payer: HMO | Attending: Cardiovascular Disease | Admitting: Cardiovascular Disease

## 2022-02-05 ENCOUNTER — Encounter
Admission: RE | Disposition: A | Discharge status: Home / Self Care | Payer: Self-pay | Source: Home / Self Care | Attending: Cardiovascular Disease

## 2022-02-05 DIAGNOSIS — F419 Anxiety disorder, unspecified: Secondary | ICD-10-CM | POA: Diagnosis not present

## 2022-02-05 DIAGNOSIS — K219 Gastro-esophageal reflux disease without esophagitis: Secondary | ICD-10-CM | POA: Diagnosis not present

## 2022-02-05 DIAGNOSIS — Z79899 Other long term (current) drug therapy: Secondary | ICD-10-CM | POA: Insufficient documentation

## 2022-02-05 DIAGNOSIS — I5189 Other ill-defined heart diseases: Secondary | ICD-10-CM

## 2022-02-05 DIAGNOSIS — F32A Depression, unspecified: Secondary | ICD-10-CM | POA: Diagnosis not present

## 2022-02-05 DIAGNOSIS — I2584 Coronary atherosclerosis due to calcified coronary lesion: Secondary | ICD-10-CM | POA: Insufficient documentation

## 2022-02-05 DIAGNOSIS — Z7982 Long term (current) use of aspirin: Secondary | ICD-10-CM | POA: Diagnosis not present

## 2022-02-05 DIAGNOSIS — Z7902 Long term (current) use of antithrombotics/antiplatelets: Secondary | ICD-10-CM | POA: Insufficient documentation

## 2022-02-05 DIAGNOSIS — I471 Supraventricular tachycardia: Secondary | ICD-10-CM | POA: Diagnosis not present

## 2022-02-05 DIAGNOSIS — I200 Unstable angina: Secondary | ICD-10-CM

## 2022-02-05 DIAGNOSIS — Z955 Presence of coronary angioplasty implant and graft: Secondary | ICD-10-CM | POA: Insufficient documentation

## 2022-02-05 DIAGNOSIS — I5032 Chronic diastolic (congestive) heart failure: Secondary | ICD-10-CM | POA: Diagnosis not present

## 2022-02-05 DIAGNOSIS — E785 Hyperlipidemia, unspecified: Secondary | ICD-10-CM | POA: Insufficient documentation

## 2022-02-05 DIAGNOSIS — G8929 Other chronic pain: Secondary | ICD-10-CM | POA: Insufficient documentation

## 2022-02-05 DIAGNOSIS — I25110 Atherosclerotic heart disease of native coronary artery with unstable angina pectoris: Secondary | ICD-10-CM | POA: Diagnosis present

## 2022-02-05 DIAGNOSIS — Z8673 Personal history of transient ischemic attack (TIA), and cerebral infarction without residual deficits: Secondary | ICD-10-CM | POA: Insufficient documentation

## 2022-02-05 DIAGNOSIS — Z87891 Personal history of nicotine dependence: Secondary | ICD-10-CM | POA: Insufficient documentation

## 2022-02-05 DIAGNOSIS — I130 Hypertensive heart and chronic kidney disease with heart failure and stage 1 through stage 4 chronic kidney disease, or unspecified chronic kidney disease: Secondary | ICD-10-CM | POA: Insufficient documentation

## 2022-02-05 DIAGNOSIS — N1832 Chronic kidney disease, stage 3b: Secondary | ICD-10-CM | POA: Diagnosis not present

## 2022-02-05 DIAGNOSIS — I25118 Atherosclerotic heart disease of native coronary artery with other forms of angina pectoris: Secondary | ICD-10-CM

## 2022-02-05 HISTORY — PX: LEFT HEART CATH AND CORONARY ANGIOGRAPHY: CATH118249

## 2022-02-05 SURGERY — LEFT HEART CATH AND CORONARY ANGIOGRAPHY
Anesthesia: Moderate Sedation

## 2022-02-05 MED ORDER — FENTANYL CITRATE (PF) 100 MCG/2ML IJ SOLN
INTRAMUSCULAR | Status: AC
  Filled 2022-02-05: qty 2

## 2022-02-05 MED ORDER — SODIUM CHLORIDE 0.9 % IV SOLN: 250.0000 mL | INTRAVENOUS | Status: DC | PRN

## 2022-02-05 MED ORDER — IOHEXOL 300 MG/ML  SOLN
INTRAMUSCULAR | Status: DC | PRN
  Administered 2022-02-05: 18 mL

## 2022-02-05 MED ORDER — HEPARIN SODIUM (PORCINE) 1000 UNIT/ML IJ SOLN
INTRAMUSCULAR | Status: DC | PRN
  Administered 2022-02-05: 4000 [IU] via INTRAVENOUS

## 2022-02-05 MED ORDER — SODIUM CHLORIDE 0.9% FLUSH: 3.0000 mL | Freq: Two times a day (BID) | INTRAVENOUS | Status: DC

## 2022-02-05 MED ORDER — VERAPAMIL HCL 2.5 MG/ML IV SOLN
INTRAVENOUS | Status: DC | PRN
  Administered 2022-02-05: 2.5 mg via INTRA_ARTERIAL

## 2022-02-05 MED ORDER — SODIUM CHLORIDE 0.9% FLUSH
3.0000 mL | INTRAVENOUS | Status: DC | PRN
Start: 2022-02-05 — End: 2022-02-05

## 2022-02-05 MED ORDER — VERAPAMIL HCL 2.5 MG/ML IV SOLN
INTRAVENOUS | Status: AC
  Filled 2022-02-05: qty 2

## 2022-02-05 MED ORDER — ACETAMINOPHEN 325 MG PO TABS: 650.0000 mg | ORAL_TABLET | ORAL | Status: DC | PRN

## 2022-02-05 MED ORDER — SODIUM CHLORIDE 0.9 % IV SOLN: INTRAVENOUS | Status: DC

## 2022-02-05 MED ORDER — SODIUM CHLORIDE 0.9% FLUSH: 3.0000 mL | INTRAVENOUS | Status: DC | PRN

## 2022-02-05 MED ORDER — MIDAZOLAM HCL 2 MG/2ML IJ SOLN
INTRAMUSCULAR | Status: AC
  Filled 2022-02-05: qty 2

## 2022-02-05 MED ORDER — LIDOCAINE HCL (PF) 1 % IJ SOLN
INTRAMUSCULAR | Status: DC | PRN
  Administered 2022-02-05: 2 mL

## 2022-02-05 MED ORDER — FENTANYL CITRATE (PF) 100 MCG/2ML IJ SOLN
INTRAMUSCULAR | Status: DC | PRN
  Administered 2022-02-05: 25 ug via INTRAVENOUS
  Administered 2022-02-05: 50 ug via INTRAVENOUS

## 2022-02-05 MED ORDER — LIDOCAINE HCL 1 % IJ SOLN
INTRAMUSCULAR | Status: AC
  Filled 2022-02-05: qty 20

## 2022-02-05 MED ORDER — MIDAZOLAM HCL 2 MG/2ML IJ SOLN
INTRAMUSCULAR | Status: DC | PRN
  Administered 2022-02-05 (×2): 1 mg via INTRAVENOUS

## 2022-02-05 MED ORDER — HEPARIN SODIUM (PORCINE) 1000 UNIT/ML IJ SOLN
INTRAMUSCULAR | Status: AC
  Filled 2022-02-05: qty 10

## 2022-02-05 MED ORDER — FUROSEMIDE 20 MG PO TABS: 40.0000 mg | ORAL_TABLET | Freq: Every day | ORAL | 3 refills | Status: DC

## 2022-02-05 MED ORDER — HEPARIN (PORCINE) IN NACL 1000-0.9 UT/500ML-% IV SOLN
INTRAVENOUS | Status: DC | PRN
  Administered 2022-02-05 (×2): 500 mL

## 2022-02-05 MED ORDER — LABETALOL HCL 5 MG/ML IV SOLN: 10.0000 mg | INTRAVENOUS | Status: DC | PRN

## 2022-02-05 MED ORDER — HEPARIN (PORCINE) IN NACL 1000-0.9 UT/500ML-% IV SOLN
INTRAVENOUS | Status: AC
  Filled 2022-02-05: qty 1000

## 2022-02-05 SURGICAL SUPPLY — 11 items
BAND CMPR LRG ZPHR (HEMOSTASIS) ×1
BAND ZEPHYR COMPRESS 30 LONG (HEMOSTASIS) IMPLANT
CATH INFINITI 5FR JK (CATHETERS) IMPLANT
DRAPE BRACHIAL (DRAPES) IMPLANT
GLIDESHEATH SLEND SS 6F .021 (SHEATH) IMPLANT
GUIDEWIRE INQWIRE 1.5J.035X260 (WIRE) IMPLANT
INQWIRE 1.5J .035X260CM (WIRE) ×1
PACK CARDIAC CATH (CUSTOM PROCEDURE TRAY) ×2 IMPLANT
PROTECTION STATION PRESSURIZED (MISCELLANEOUS) ×1
SET ATX SIMPLICITY (MISCELLANEOUS) IMPLANT
STATION PROTECTION PRESSURIZED (MISCELLANEOUS) IMPLANT

## 2022-02-05 NOTE — Telephone Encounter (Signed)
Order for echo and 1 wk bmp placed. Message fwd to the scheduling to call the pt to schedule the echo.

## 2022-02-05 NOTE — Progress Notes (Signed)
Pt ate sandwich tray and ginger ale   Family at bedside tv on

## 2022-02-05 NOTE — Telephone Encounter (Signed)
William Hampshire, MD  Parris-Godley, Izell Cudahy, RN; Rise Mu, PA-C; Minna Merritts, MD His cardiac cath showed patent RCA stent with stable coronary anatomy.  No revascularization was needed.  His LVEDP was severely elevated at 37 mmHg and I suspect he has significant diastolic heart failure.  Please arrange for him to have an echocardiogram done.  I increased his Lasix to 40 mg once daily.  Get basic metabolic profile in 1 week.  He has a follow-up appointment with Thurmond Butts on September 15.

## 2022-02-05 NOTE — Interval H&P Note (Signed)
History and Physical Interval Note:  02/05/2022 12:37 PM  William Mueller  has presented today for surgery, with the diagnosis of LT Heart Cath   Unstable Angina   4 hrs hydration.  The various methods of treatment have been discussed with the patient and family. After consideration of risks, benefits and other options for treatment, the patient has consented to  Procedure(s): LEFT HEART CATH AND CORONARY ANGIOGRAPHY (N/A) as a surgical intervention.  The patient's history has been reviewed, patient examined, no change in status, stable for surgery.  I have reviewed the patient's chart and labs.  Questions were answered to the patient's satisfaction.     Kathlyn Sacramento

## 2022-02-09 ENCOUNTER — Encounter: Payer: Self-pay | Admitting: Cardiovascular Disease

## 2022-02-09 ENCOUNTER — Telehealth: Payer: Self-pay | Admitting: Cardiovascular Disease

## 2022-02-09 DIAGNOSIS — I471 Supraventricular tachycardia: Secondary | ICD-10-CM

## 2022-02-09 DIAGNOSIS — I25118 Atherosclerotic heart disease of native coronary artery with other forms of angina pectoris: Secondary | ICD-10-CM

## 2022-02-09 DIAGNOSIS — I10 Essential (primary) hypertension: Secondary | ICD-10-CM

## 2022-02-09 NOTE — Telephone Encounter (Signed)
Pt c/o BP issue: STAT if pt c/o blurred vision, one-sided weakness or slurred speech  1. What are your last 5 BP readings? 177/97 162/88 172/78 164/83 170/86  2. Are you having any other symptoms (ex. Dizziness, headache, blurred vision, passed out)? No   3. What is your BP issue? Pt is requesting call back to discuss his high bp and medication management.

## 2022-02-10 MED ORDER — METOPROLOL SUCCINATE ER 100 MG PO TB24: 100.0000 mg | ORAL_TABLET | Freq: Two times a day (BID) | ORAL | 3 refills | Status: DC

## 2022-02-10 NOTE — Telephone Encounter (Signed)
Spoke with patients wife per release form and reviewed provider recommendations to increase Toprol XL to 100 mg twice a day. Inquired if he needed refill and sent that into his pharmacy. She verbalized understanding, agreeable with plan, and no further questions at this time.

## 2022-02-10 NOTE — Telephone Encounter (Signed)
Spoke with patients wife per release form. She reported patients blood pressures and had concerns about them running high. Inquired if these readings were before or after medications. She stated they were after meds. On Tuesday it was at its highest at 177/97. She also states that on Monday he had chest pain two times but none yesterday. No other symptoms at this time and she reports his weight has been good. Advised that I would send over to provider for his review and would give her a call back with any further recommendations.

## 2022-02-10 NOTE — Telephone Encounter (Signed)
In an effort to hopefully minimize number of medications, please have him increase Toprol-XL to 100 mg twice daily.

## 2022-02-10 NOTE — Telephone Encounter (Signed)
Spoke w/ William Mueller.  Advised him of Dr. Tyrell Antonio recommendations. He is aware of ECHO appt on 9/11 and will stop by the [facility] before that time for labs.  He is appreciative of the call.

## 2022-02-15 ENCOUNTER — Ambulatory Visit
Admission: RE | Admit: 2022-02-15 | Discharge: 2022-02-15 | Disposition: A | Discharge status: Home / Self Care | Payer: HMO | Source: Ambulatory Visit | Attending: Cardiovascular Disease | Admitting: Cardiovascular Disease

## 2022-02-15 ENCOUNTER — Other Ambulatory Visit
Admission: RE | Admit: 2022-02-15 | Discharge: 2022-02-15 | Disposition: A | Discharge status: Home / Self Care | Payer: HMO | Source: Ambulatory Visit | Attending: Cardiovascular Disease | Admitting: Cardiovascular Disease

## 2022-02-15 DIAGNOSIS — I2510 Atherosclerotic heart disease of native coronary artery without angina pectoris: Secondary | ICD-10-CM | POA: Insufficient documentation

## 2022-02-15 DIAGNOSIS — J449 Chronic obstructive pulmonary disease, unspecified: Secondary | ICD-10-CM | POA: Diagnosis not present

## 2022-02-15 DIAGNOSIS — I5189 Other ill-defined heart diseases: Secondary | ICD-10-CM

## 2022-02-15 DIAGNOSIS — Z87891 Personal history of nicotine dependence: Secondary | ICD-10-CM | POA: Diagnosis not present

## 2022-02-15 DIAGNOSIS — Z955 Presence of coronary angioplasty implant and graft: Secondary | ICD-10-CM | POA: Diagnosis not present

## 2022-02-15 DIAGNOSIS — I272 Pulmonary hypertension, unspecified: Secondary | ICD-10-CM | POA: Insufficient documentation

## 2022-02-15 DIAGNOSIS — I252 Old myocardial infarction: Secondary | ICD-10-CM | POA: Diagnosis not present

## 2022-02-15 DIAGNOSIS — E785 Hyperlipidemia, unspecified: Secondary | ICD-10-CM | POA: Diagnosis not present

## 2022-02-15 LAB — ECHOCARDIOGRAM COMPLETE
AR max vel: 2.67 cm2
AV Area VTI: 2.44 cm2
AV Area mean vel: 2.2 cm2
AV Mean grad: 3 mmHg
AV Peak grad: 4.3 mmHg
Ao pk vel: 1.04 m/s
Area-P 1/2: 4.17 cm2
S' Lateral: 2.04 cm

## 2022-02-15 LAB — BASIC METABOLIC PANEL
Anion gap: 9 (ref 5–15)
BUN: 26 mg/dL — ABNORMAL HIGH (ref 8–23)
CO2: 32 mmol/L (ref 22–32)
Calcium: 9.2 mg/dL (ref 8.9–10.3)
Chloride: 97 mmol/L — ABNORMAL LOW (ref 98–111)
Creatinine, Ser: 2.1 mg/dL — ABNORMAL HIGH (ref 0.61–1.24)
GFR, Estimated: 33 mL/min — ABNORMAL LOW (ref >60)
Glucose, Bld: 163 mg/dL — ABNORMAL HIGH (ref 70–99)
Potassium: 3.7 mmol/L (ref 3.5–5.1)
Sodium: 138 mmol/L (ref 135–145)

## 2022-02-16 ENCOUNTER — Telehealth: Payer: Self-pay | Admitting: Cardiovascular Disease

## 2022-02-16 MED ORDER — FUROSEMIDE 20 MG PO TABS: ORAL_TABLET | ORAL | 3 refills | Status: DC

## 2022-02-16 NOTE — Telephone Encounter (Signed)
I called and spoke with the patient regarding his lab results and echo results. He voices understanding of these results.   I have advised him to please decrease lasix to 40 mg 5 days a week as per Dr. Donivan Scull recommendations. I have asked that he pick 2 days a week to not take his dose, but not to skip 2 days in a row.   The patient confirms he already took his lasix this morning.  He has been advised to skip lasix tomorrow and Friday morning and then follow up on the office on Friday afternoon (02/19/22) as already scheduled with Christell Faith, PA.   The patient voices understanding and is agreeable.  The patient also advised that his HR typically runs in the 60's, but this morning he was running in the 50's. He denies dizziness/ lightheadedness. He is currently on metoprolol succinate 100 mg BID.  I have asked the patient to monitor his HR today and tomorrow. He is advised to call us back on Thursday if HR's are still running in the 50's as his metoprolol may need to be adjusted.   He will call back sooner if he develops symptoms.  The patient voices understanding of all of the above and is agreeable.  He was very appreciative of the call back.

## 2022-02-16 NOTE — Progress Notes (Unsigned)
Cardiology Office Note    Date:  02/19/2022   ID:  William Mueller, DOB 02/14/1951, MRN [id number]  PCP:  Leonides Sake, MD  Cardiologist:  Ida Rogue, MD  Electrophysiologist:  None   Chief Complaint: Follow up  History of Present Illness:   William Mueller is a 71 y.o. male with history of CAD status post PCI to the RCA in 12/2013 with chronic chest pain status post multiple repeat caths as outlined below, PSVT, CKD stage III, cryptogenic stroke s/p ILR, COPD secondary to long history of tobacco use with e-cigarette use on home oxygen, HFpEF, paroxysmal atrial tachycardia, labile hypertension with orthostatic hypotension, anemia, depression, anxiety, HTN, HLD, and GERD who presents for follow-up of LHC and echo.   He was admitted to [facility] in 12/2013 with chest pain.  LHC showed left main normal, mid LAD diffuse 20%, ostial D1 50%, prox D1 50%, D2 30%, mid LCx moderately calcified with 30%, RCA mildly calcified with diffuse 30% proximally. The vessel was occluded in the midsegment. Faint left-to-right collaterals were noted. He underwent successful PCI/DES to the RCA. Echo showed EF 55-60%, mild LVH, GR1DD, inferior HK, elevated CVP, dilated IVC suggestive of elevated RA pressure. Admitted again in 02/2014 with chest pain, ruled out, left AMA. Admitted 02/2015 with chest pain. Ruled out. Underwent LHC that showed patent RCA stent with proximal RCA 55%, d1 80%, mid LCx to distal LCx 90%. Admitted 09/2015 with chest pain, ruled out, LHC showed patent RCA stent with unchanged nonobstructive disease when compared to prior LHC. FFR of RCA lesion not significant at 0.93. Medical management was advised. Echo in 09/2017 showed an EF of 65-70%, normal wall motion, Gr2DD. Most recently, he was admitted in 09/2018 with recurrent chest pain. Troponin minimally elevated peaking at 0.04. He underwent repeat LHC that showed left main without significant disease, D1 80% (small to moderate size vessel), proximal LCx  30%, mid to distal LCx 90% (small vessel), proximal RCA 30% prior to the previously placed stent in the proximal to mid region without significant ISR. LVEF 55%. Medical management was advised. He was started on Ranexa 500 mg bid. In telehealth follow up he was feeling well. He stated "cathereization fixed me." He was reminded no intervention was performed and he was started on Ranexa. Following this, the patient stopped Ranexa on his own stating his chest pain was improved. He also noted a jittery feeling with this medication and possible difficulty in emptying his bladder.  Zio patch in 11/2018 showed NSR with an average heart rate of 104 bpm, 2 SVT runs occurred with the fastest interval lasting 6 beats with a maximum rate of 152 bpm and the longest interval lasting 8 beats with an average rate of 111 bpm, isolated PACS, atrial couplets, triplets, PVCs, and ventricular couplets were rare. Ventricular bigeminy was noted.  Follow up echo on 12/19/2018 showed an EF of 62-70%, diastolic dysfunction, no RWMA, normal RVSF, valves were not well visualized.    He was admitted in 05/2020 with a cryptogenic stroke.  Surface echo at that time demonstrated an EF of 55 to 60%, no regional wall motion abnormalities, grade 1 diastolic dysfunction, and normal RV systolic function/ventricular cavity size.  Zio patch showed a predominant rhythm of sinus rhythm with no evidence of A-fib/flutter or significant pauses.  8 episodes of SVT/atrial tachycardia were noted.  He subsequently underwent loop implantation with device interrogations dating back to 08/2020 showing no evidence of A-fib/flutter.   He was admitted  to the hospital from 8/25 through 01/30/2022 with chest pain that documentation indicates occurred with rated 10 out of 10 with associated diaphoresis and developed immediately after one of his favorite caregivers reported to him that she was being transferred and would no longer be working with him, which made him upset.   High-sensitivity troponin 19 with a delta and peak troponin of 28.  He remained symptom-free in the hospital and was discharged to outpatient follow-up.  He was seen in hospital follow-up on 02/02/2022 noting a 2 to 3-week history of worsening substernal chest pressure as well as some intermittent elevated BP readings.  LHC on 02/05/2022 showed no significant change in coronary anatomy since cardiac cath in 2020 with patent RCA stent with no significant restenosis.  Stable moderate proximal RCA stenosis and subocclusive disease in a small AV groove LCx artery and first diagonal.  LVEDP elevated at 37 mmHg.  Medical therapy was recommended along with titration of furosemide to 40 mg daily.  Echo on 02/15/2022 demonstrated an EF of 55 to 60%, mild LVH, normal LV diastolic function parameters, normal RV systolic function and ventricular cavity size, no significant valvular abnormalities, and an estimated right atrial pressure of 3 mmHg.  Follow-up labs on 02/15/2022 showed an increase in his BUN/SCR as outlined below with primary cardiologist recommending the patient decrease furosemide to 40 mg 5 days/week.  He comes in today accompanied by his wife and continues to note chronic dyspnea and fatigue.  With this, there continues to be some episodes of chest pain.  He indicates he is not doing well today as he did not sleep well last night.  He indicates he will be running out of his Xanax before his next prescription is due.  This is stressing him.  No lower extremity swelling or orthopnea.  Home BP readings are largely in the 150s to 160s bpm.  No right radial arteriotomy site complications.   Labs independently reviewed: 02/2022 - potassium 3.7, BUN 26, SCr 2.10 (baseline around 1.8) 01/2022 - Hgb 11.5, PLT 250 05/2020 - TC 98, TG 79, HDL 31, LDL 51, A1c 5.3, albumin 4.2, AST/ALT normal 08/2019 - TSH normal  Past Medical History:  Diagnosis Date   Anginal pain ([place]) 06/2017   Anxiety    Bronchitis 06/2017    Chronic chest pain    Chronic kidney disease (CKD) stage G3b/A1, moderately decreased glomerular filtration rate (GFR) between 30-44 mL/min/1.73 square meter and albuminuria creatinine ratio less than 30 mg/g (HCC)    Chronic lower back pain    WITH LEG WEAKNESS   COPD (chronic obstructive pulmonary disease) (HCC)    EMPHYSEMA, O2 AT 2L PRN   Coronary artery disease    a. 12/2013 PCI: mRCA 100% with L to R collats s/p PCI/DES;  b. 06/2014 MV: no ischemia/infarct, EF 53%;  c. 03/2015 Cath: LM nl, LAD nl, D1 80 (1.47m), LCX 964m<1.69m16m OM1 nl, RCA 55p, patent stent, RPDA nl, EF 65%; d. 09/2015 Cath: LM nl, LAD 34m20m 80 (<2mm)33mCX 50m/d34m.69mm), 77m nl, RCA 55p (FFR 0.93), patent stent, RPDA nl-->Med Rx.   Cryptogenic stroke (HCC) 12Pavo/2021   Some right sided weakness and balance issues   Daily headache    Depression    Diastolic dysfunction    a. echo 12/2013: EF 55-60%, mild LVH, GR1DD, inf HK, elevated CVP, mildly dilated IVC suggestive of increased RA pressure   Dyspnea    WHEEZING   GERD (gastroesophageal reflux disease)    REFLUX  Hearing loss    High cholesterol    Hypertension    Myocardial infarction ([place]) 2015   Nicotine addiction    a. using eCigs.   Orthopnea    Sleep apnea    No CPAP   Status post placement of implantable loop recorder 07/16/2020   Vertigo     Past Surgical History:  Procedure Laterality Date   CARDIAC CATHETERIZATION     MC x 1 stent   CARDIAC CATHETERIZATION Left 03/12/2015   Procedure: Left Heart Cath and Coronary Angiography;  Surgeon: Leonie Man, MD;  Location: [facility];  Service: Cardiovascular;  Laterality: Left;   CARDIAC CATHETERIZATION N/A 09/29/2015   Procedure: Left Heart Cath and Coronary Angiography;  Surgeon: Wellington Hampshire, MD;  Location: [facility];  Service: Cardiovascular;  Laterality: N/A;   CATARACT EXTRACTION W/PHACO Right 08/10/2017   Procedure: CATARACT EXTRACTION PHACO AND INTRAOCULAR LENS  PLACEMENT ([place]) RIGHT;  Surgeon: Leandrew Koyanagi, MD;  Location: [facility];  Service: Ophthalmology;  Laterality: Right;   CATARACT EXTRACTION W/PHACO Left 10/15/2020   Procedure: CATARACT EXTRACTION PHACO AND INTRAOCULAR LENS PLACEMENT ([place]) LEFT;  Surgeon: Leandrew Koyanagi, MD;  Location: [facility];  Service: Ophthalmology;  Laterality: Left;  sleep apnea CDE 15.52 1:55.5 minutes 13.5%   CORONARY ANGIOPLASTY     STENT PLACEMENT   LEFT HEART CATH AND CORONARY ANGIOGRAPHY N/A 09/06/2018   Procedure: LEFT HEART CATH AND CORONARY ANGIOGRAPHY;  Surgeon: Minna Merritts, MD;  Location: [facility];  Service: Cardiovascular;  Laterality: N/A;   LEFT HEART CATH AND CORONARY ANGIOGRAPHY N/A 02/05/2022   Procedure: LEFT HEART CATH AND CORONARY ANGIOGRAPHY;  Surgeon: Wellington Hampshire, MD;  Location: [facility];  Service: Cardiovascular;  Laterality: N/A;   LEFT HEART CATHETERIZATION WITH CORONARY ANGIOGRAM N/A 12/12/2013   Procedure: LEFT HEART CATHETERIZATION WITH CORONARY ANGIOGRAM;  Surgeon: Wellington Hampshire, MD;  Location: [facility];  Service: Cardiovascular;  Laterality: N/A;    Current Medications: Current Meds  Medication Sig   acetaminophen (TYLENOL) 500 MG tablet Take 1,000 mg by mouth daily as needed for headache.    ALPRAZolam (XANAX XR) 2 MG 24 hr tablet Take 2 mg by mouth every morning.   amLODipine (NORVASC) 5 MG tablet Take 1 tablet (5 mg total) by mouth daily.   aspirin 81 MG EC tablet Take 1 tablet (81 mg total) by mouth daily.   atorvastatin (LIPITOR) 80 MG tablet Take 1 tablet by mouth once daily   clopidogrel (PLAVIX) 75 MG tablet Take 1 tablet by mouth once daily   COMBIVENT RESPIMAT 20-100 MCG/ACT AERS respimat INHALE 1 PUFF BY MOUTH 4 TIMES DAILY AS NEEDED FOR WHEEZING   ezetimibe (ZETIA) 10 MG tablet Take 1 tablet by mouth once daily   Fluticasone-Umeclidin-Vilant (TRELEGY ELLIPTA) 100-62.5-25 MCG/ACT AEPB Inhale 1 puff into the  lungs daily.   furosemide (LASIX) 20 MG tablet Take 2 tablets (40 mg) by mouth once a day 5 days a week   hydrALAZINE (APRESOLINE) 10 MG tablet Take 1 tablet (10 mg total) by mouth 3 (three) times daily as needed. 02-02-2022   KLOR-CON M20 20 MEQ tablet Take 20 mEq by mouth daily.   metoprolol succinate (TOPROL-XL) 100 MG 24 hr tablet Take 1 tablet (100 mg total) by mouth 2 (two) times daily.   nitroGLYCERIN (NITROSTAT) 0.4 MG SL tablet Place 1 tablet (0.4 mg total) under the tongue every 5 (five) minutes as needed for chest  pain.   OXYGEN Inhale 2 L into the lungs daily as needed (oxygen).    pantoprazole (PROTONIX) 40 MG tablet Take 40 mg by mouth daily.   predniSONE (DELTASONE) 5 MG tablet TAKE 1 TABLET BY MOUTH ONCE DAILY FOR 90 DAYS   roflumilast (DALIRESP) 500 MCG TABS tablet Take 1 tablet (500 mcg total) by mouth daily.   sulfamethoxazole-trimethoprim (BACTRIM) 400-80 MG tablet TAKE 1 TABLET BY MOUTH 3 TIMES WEEKLY    Allergies:   Paroxetine hcl, Serotonin reuptake inhibitors (ssris), Diazepam, Doxycycline monohydrate, Escitalopram oxalate, Lorazepam, and Tetracyclines & related   Social History   Socioeconomic History   Marital status: Married    Spouse name: Not on file   Number of children: Not on file   Years of education: Not on file   Highest education level: Not on file  Occupational History   Not on file  Tobacco Use   Smoking status: Former    Packs/day: 3.00    Years: 48.00    Total pack years: 144.00    Types: E-cigarettes, Cigarettes    Quit date: 06/17/2009    Years since quitting: 12.6   Smokeless tobacco: Never   Tobacco comments:    quit e-cigs 03/2020  Vaping Use   Vaping Use: Former   Quit date: 04/15/2020  Substance and Sexual Activity   Alcohol use: Yes    Alcohol/week: 6.0 standard drinks of alcohol    Types: 6 Cans of beer per week    Comment: Occasional    Drug use: No   Sexual activity: Not Currently  Other Topics Concern   Not on file   Social History Narrative   Not on file   Social Determinants of Health   Financial Resource Strain: Not on file  Food Insecurity: Not on file  Transportation Needs: Not on file  Physical Activity: Not on file  Stress: Not on file  Social Connections: Not on file     Family History:  The patient's family history includes CVA in his mother; Coronary artery disease in his brother and father; Heart disease in his father; Heart disease (age of onset: 24) in his brother. There is no history of Kidney disease or Prostate cancer.  ROS:   12-point review of systems is negative unless otherwise noted in the HPI.   EKGs/Labs/Other Studies Reviewed:    Studies reviewed were summarized above. The additional studies were reviewed today:  2D echo9/04/2022: 1. Left ventricular ejection fraction, by estimation, is 55 to 60%. The  left ventricle has normal function. Left ventricular endocardial border  not optimally defined to evaluate regional wall motion. There is mild left  ventricular hypertrophy. Left  ventricular diastolic parameters were normal.   2. Right ventricular systolic function is normal. The right ventricular  size is normal. Tricuspid regurgitation signal is inadequate for assessing  PA pressure.   3. The mitral valve is normal in structure. No evidence of mitral valve  regurgitation. No evidence of mitral stenosis.   4. The aortic valve is normal in structure. Aortic valve regurgitation is  not visualized. No aortic stenosis is present.   5. The inferior vena cava is normal in size with greater than 50%  respiratory variability, suggesting right atrial pressure of 3 mmHg. __________  LHC 02/05/2022:   Prox LAD to Mid LAD lesion is 40% stenosed.   1st Diag lesion is 80% stenosed.   LPAV lesion is 95% stenosed.   Prox RCA lesion is 50% stenosed.   Previously placed  Prox RCA to Mid RCA stent of unknown type is  widely patent.   1.  No significant change in coronary  anatomy since most recent cardiac catheterization in 2020.  Patent RCA stent with no significant restenosis.  Stable moderate proximal RCA stenosis and subocclusive disease in a small AV groove left circumflex artery and first diagonal. 2.  Left ventricular angiography was not performed due to chronic kidney disease. 3.  Severely elevated left ventricular end-diastolic pressure 37 mmHg.   Recommendations: Recommend continuing medical therapy for coronary artery disease. I increased the dose of furosemide to 40 mg once daily.  Recommend an outpatient echocardiogram given her recent EF evaluation. Only 18 mL of contrast was used for the procedure. __________  Elwyn Reach patch 05/2020: Normal sinus rhythm No patient triggered events min HR of 45 bpm, max HR of 138 bpm, and avg HR of 66 bpm.   8 Supraventricular Tachycardia/atrial tachycardia runs occurred, the run with the fastest interval lasting 5 beats with a max rate of 138 bpm, the longest lasting 13 beats with an avg rate of 110 bpm.    Possible Junctional Rhythm was present.  Isolated SVEs were rare (<1.0%), SVE Couplets were rare (<1.0%), and SVE Triplets were rare (<1.0%). Isolated VEs were rare (<1.0%, 41), VE Couplets were rare (<1.0%, 3), and VE Triplets were rare (<1.0%, 2). __________   2D echo 05/08/2020: 1. Left ventricular ejection fraction, by estimation, is 55 to 60%. The  left ventricle has normal function. The left ventricle has no regional  wall motion abnormalities. Left ventricular diastolic parameters are  consistent with Grade I diastolic  dysfunction (impaired relaxation).   2. Right ventricular systolic function is normal. The right ventricular  size is normal.  _________   2D Echo 12/19/2018: 1. The left ventricle has normal systolic function with an ejection fraction of 60-65%. The cavity size was normal. There is mildly increased left ventricular wall thickness. Left ventricular diastolic Doppler parameters are  consistent with impaired  relaxation. No evidence of left ventricular regional wall motion abnormalities.  2. The right ventricle has normal systolic function. The cavity was normal. There is no increase in right ventricular wall thickness. Right ventricular systolic pressure could not be assessed.  3. The aortic valve was not well visualized. __________   Elwyn Reach monitor 11/2018: Event Monitor   Normal sinus rhythm Avg HR of 104 bpm.   2 Supraventricular Tachycardia runs occurred, the run with the fastest interval lasting 6 beats with a max rate of 152 bpm,  the longest lasting 8 beats with an avg rate of 111 bpm.    Isolated SVEs were rare (<1.0%), SVE Couplets were rare (<1.0%), and SVE Triplets were rare (<1.0%). Isolated VEs were rare (<1.0%), VE Couplets were rare (<1.0%), and no VE Triplets were present. Ventricular Bigeminy was present.   Patient triggered events were not associated with significant arrhythmia __________   [facility] 09/2018: Coronary angiography:  Coronary dominance: Right or codominant  Left mainstem:   Large vessel that bifurcates into the LAD and left circumflex, no significant disease noted  Left anterior descending (LAD):   Large vessel that extends to the apical region, diagonal branch 2, first diagonal branch is a small to moderate size vessel, severe diffuse disease predominantly in the proximal region estimated at 80%, second diagonal branch moderate to large size vessel no significant disease  Left circumflex (LCx):  Large vessel with OM branch 2, 30% mid left circumflex disease, distal left circumflex after takeoff of OM  2 is severely diseased, small vessel.  There is a large OM branch with no significant disease  Right coronary artery (RCA):  Right dominant vessel with PL and PDA, no significant disease noted.  30% proximal RCA disease prior to long stent in the proximal to mid region, stent with no significant in-stent restenosis  Left ventriculography:  Left ventricular systolic function is normal, LVEF is estimated at 55%, there is no significant mitral regurgitation , no significant aortic valve stenosis  Final Conclusions:   Significant disease of a small diagonal branch and small distal left circumflex branches Mean branches of the RCA, left circumflex, LAD and large diagonal are intact with no significant disease  Recommendations:  Etiology of his chest pain could be secondary to small vessel disease Pictures reviewed by interventional cardiology Medical management recommended We will recommend he start Ranexa 500 mg twice daily for 1 week then up to 1000 mg twice daily Continue other outpatient medications   EKG:  EKG is ordered today.  The EKG ordered today demonstrates NSR, 65 bpm, baseline artifact, diffuse T wave inversion consistent with prior tracing  Recent Labs: 01/30/2022: Hemoglobin 11.5; Platelets 250 02/19/2022: BUN 23; Creatinine, Ser 2.19; Potassium 4.5; Sodium 137  Recent Lipid Panel    Component Value Date/Time   CHOL 98 05/08/2020 0435   CHOL 114 07/23/2019 1155   TRIG 79 05/08/2020 0435   HDL 31 (L) 05/08/2020 0435   HDL 38 (L) 07/23/2019 1155   CHOLHDL 3.2 05/08/2020 0435   VLDL 16 05/08/2020 0435   LDLCALC 51 05/08/2020 0435   LDLCALC 53 07/23/2019 1155    PHYSICAL EXAM:    VS:  BP (!) 150/90 (BP Location: Right Arm, Patient Position: Sitting, Cuff Size: Normal)   Pulse 65   Ht '5\' 9"'$  (1.753 m)   Wt 193 lb 9.6 oz (87.8 kg)   SpO2 100%   BMI 28.59 kg/m   BMI: Body mass index is 28.59 kg/m.  Physical Exam Vitals reviewed.  Constitutional:      Appearance: He is well-developed.  HENT:     Head: Normocephalic and atraumatic.  Eyes:     General:        Right eye: No discharge.        Left eye: No discharge.  Neck:     Vascular: No JVD.  Cardiovascular:     Rate and Rhythm: Normal rate and regular rhythm.     Pulses:          Posterior tibial pulses are 2+ on the right side and 2+ on the  left side.     Heart sounds: Normal heart sounds, S1 normal and S2 normal. Heart sounds not distant. No midsystolic click and no opening snap. No murmur heard.    No friction rub.     Comments: Right radial arteriotomy site is well-healed without active bleeding, bruising, swelling, warmth, erythema, or tenderness to palpation.  Radial pulse 2+ proximal and distal to the arteriotomy site. Pulmonary:     Effort: Pulmonary effort is normal. No respiratory distress.     Breath sounds: Normal breath sounds. No decreased breath sounds, wheezing or rales.     Comments: Supplemental oxygen via nasal cannula noted. Chest:     Chest wall: No tenderness.  Abdominal:     General: There is no distension.  Musculoskeletal:     Cervical back: Normal range of motion.     Right lower leg: No edema.     Left lower leg: No  edema.  Skin:    General: Skin is warm and dry.     Nails: There is no clubbing.  Neurological:     Mental Status: He is alert and oriented to person, place, and time.  Psychiatric:        Speech: Speech normal.        Behavior: Behavior normal.        Thought Content: Thought content normal.        Judgment: Judgment normal.     Wt Readings from Last 3 Encounters:  02/19/22 193 lb 9.6 oz (87.8 kg)  02/05/22 191 lb (86.6 kg)  02/02/22 197 lb (89.4 kg)     ASSESSMENT & PLAN:   CAD involving the native coronary arteries with chronic angina: Repeat LHC earlier this month showed stable coronary anatomy with patent stent when compared to prior cath with elevated LVEDP status post diuresis with titration of furosemide with subsequent AKI.  No indication for revascularization.  Escalate antianginal therapy with addition of amlodipine 5 mg daily.  Defer addition of Imdur secondary to prior intolerance with headache.  Defer reinitiation of ranolazine with AKI with a creatinine clearance of less than 60 mL/min.  He will otherwise continue Toprol-XL for antianginal therapy.  Symptoms have  previously improved with Xanax with ongoing management of this per his PCP.  No arteriotomy site complications.  No indication for further ischemic testing at this time.  PSVT/atrial tachycardia: Quiescent.  He remains on Toprol-XL.  HTN: Blood pressure elevated in the office today and consistent with home readings.  Add amlodipine 5 mg daily as outlined above.  Otherwise, he remains on Toprol-XL and as needed hydralazine.  Not currently on ACE inhibitor/ARB with acute on CKD.  HLD: LDL 51.  He remains on atorvastatin 80 mg and ezetimibe.  Cryptogenic stroke: No new deficits.  No evidence of A-fib/flutter on ILR interrogation.  He remains on DAPT as outlined above.  Acute on CKD stage III: Repeat BMP on furosemide 40 mg 5 days/week.  Chronic hypoxic respiratory failure with chronic dyspnea: Suspect he has diminished pulmonary pulmonary reserve contributing to his ongoing dyspnea.  Recommend he follow-up with his pulmonologist for ongoing testing/management.     Disposition: F/u with Dr. Rockey Situ in 3 months.   Medication Adjustments/Labs and Tests Ordered: Current medicines are reviewed at length with the patient today.  Concerns regarding medicines are outlined above. Medication changes, Labs and Tests ordered today are summarized above and listed in the Patient Instructions accessible in Encounters.   Signed, Christell Faith, PA-C 02/19/2022 4:58 PM     [facility] [address] [place] [address] [place], [place] [address] [phone]

## 2022-02-16 NOTE — Telephone Encounter (Signed)
1) Echocardiogram:  Minna Merritts, MD  02/16/2022  8:14 AM EDT     Echocardiogram Normal left and right ventricular size and function,  no significant valvular disease, normal pressures Overall good looking study    2) BMP results: Minna Merritts, MD  02/16/2022  8:13 AM EDT     Creatinine elevated above his baseline Lasix was increased by Dr. Fletcher Anon up to 40 daily Would recommend he hold Lasix 2 days a week

## 2022-02-18 ENCOUNTER — Telehealth: Payer: Self-pay | Admitting: Cardiovascular Disease

## 2022-02-18 NOTE — Telephone Encounter (Signed)
Souse would like a callback regarding update on pt's BP as requested. Please advise

## 2022-02-18 NOTE — Telephone Encounter (Signed)
Pts wife called to give update... he is seeing Christell Faith PA tomorrow and will bring his BP readings with him... they have been checking it 2 hours after taking his meds.   Yesterday 164/84 HR 70 and recheck 133/78 HR 69  Today 157/81 HR 76.   He is feeling well... no SOB and no dizziness.   He has gained 1 lb over night but will monitor and he denies any edema. Will watch his NA intake.   I advised that we will see them tomorrow and to let us know if anything changes prior to his appt.

## 2022-02-19 ENCOUNTER — Other Ambulatory Visit
Admission: RE | Admit: 2022-02-19 | Discharge: 2022-02-19 | Disposition: A | Discharge status: Home / Self Care | Payer: HMO | Source: Ambulatory Visit | Attending: Physician Assistant | Admitting: Physician Assistant

## 2022-02-19 ENCOUNTER — Ambulatory Visit: Payer: HMO | Attending: Physician Assistant | Admitting: Physician Assistant

## 2022-02-19 ENCOUNTER — Encounter: Payer: Self-pay | Admitting: Physician Assistant

## 2022-02-19 VITALS — BP 150/90 | HR 65 | Ht 69.0 in | Wt 193.6 lb

## 2022-02-19 DIAGNOSIS — I25118 Atherosclerotic heart disease of native coronary artery with other forms of angina pectoris: Secondary | ICD-10-CM | POA: Diagnosis not present

## 2022-02-19 DIAGNOSIS — I471 Supraventricular tachycardia: Secondary | ICD-10-CM

## 2022-02-19 DIAGNOSIS — E785 Hyperlipidemia, unspecified: Secondary | ICD-10-CM

## 2022-02-19 DIAGNOSIS — N183 Chronic kidney disease, stage 3 unspecified: Secondary | ICD-10-CM

## 2022-02-19 DIAGNOSIS — I639 Cerebral infarction, unspecified: Secondary | ICD-10-CM | POA: Diagnosis present

## 2022-02-19 DIAGNOSIS — I10 Essential (primary) hypertension: Secondary | ICD-10-CM | POA: Insufficient documentation

## 2022-02-19 DIAGNOSIS — J9611 Chronic respiratory failure with hypoxia: Secondary | ICD-10-CM

## 2022-02-19 LAB — BASIC METABOLIC PANEL
Anion gap: 7 (ref 5–15)
BUN: 23 mg/dL (ref 8–23)
CO2: 30 mmol/L (ref 22–32)
Calcium: 8.9 mg/dL (ref 8.9–10.3)
Chloride: 100 mmol/L (ref 98–111)
Creatinine, Ser: 2.19 mg/dL — ABNORMAL HIGH (ref 0.61–1.24)
GFR, Estimated: 31 mL/min — ABNORMAL LOW (ref >60)
Glucose, Bld: 109 mg/dL — ABNORMAL HIGH (ref 70–99)
Potassium: 4.5 mmol/L (ref 3.5–5.1)
Sodium: 137 mmol/L (ref 135–145)

## 2022-02-19 MED ORDER — AMLODIPINE BESYLATE 5 MG PO TABS: 5.0000 mg | ORAL_TABLET | Freq: Every day | ORAL | 3 refills | Status: DC

## 2022-02-19 NOTE — Patient Instructions (Signed)
Medication Instructions:  Your physician has recommended you make the following change in your medication:   START Amlodipine 5 mg once daily   *If you need a refill on your cardiac medications before your next appointment, please call your pharmacy*   Lab Work: BMET today over at the [facility]. Check in at registration.   If you have labs (blood work) drawn today and your tests are completely normal, you will receive your results only by: [place] (if you have MyChart) OR A paper copy in the mail If you have any lab test that is abnormal or we need to change your treatment, we will call you to review the results.   Testing/Procedures: None   Follow-Up: At [facility], you and your health needs are our priority.  As part of our continuing mission to provide you with exceptional heart care, we have created designated Provider Care Teams.  These Care Teams include your primary Cardiologist (physician) and Advanced Practice Providers (APPs -  Physician Assistants and Nurse Practitioners) who all work together to provide you with the care you need, when you need it.  We recommend signing up for the patient portal called "MyChart".  Sign up information is provided on this After Visit Summary.  MyChart is used to connect with patients for Virtual Visits (Telemedicine).  Patients are able to view lab/test results, encounter notes, upcoming appointments, etc.  Non-urgent messages can be sent to your provider as well.   To learn more about what you can do with MyChart, go to [email or web address].    Your next appointment:   3 month(s)  The format for your next appointment:   In Person  Provider:   Ida Rogue, MD         Important Information About Sugar

## 2022-02-22 ENCOUNTER — Ambulatory Visit (INDEPENDENT_AMBULATORY_CARE_PROVIDER_SITE_OTHER): Payer: HMO

## 2022-02-22 DIAGNOSIS — I639 Cerebral infarction, unspecified: Secondary | ICD-10-CM

## 2022-02-23 LAB — CUP PACEART REMOTE DEVICE CHECK
Date Time Interrogation Session: 20230917231806
Implantable Pulse Generator Implant Date: 20220209

## 2022-02-24 ENCOUNTER — Other Ambulatory Visit: Payer: Self-pay | Admitting: *Deleted

## 2022-03-06 NOTE — Progress Notes (Signed)
Carelink Summary Report / Loop Recorder 

## 2022-03-29 ENCOUNTER — Ambulatory Visit: Payer: HMO

## 2022-03-29 DIAGNOSIS — I639 Cerebral infarction, unspecified: Secondary | ICD-10-CM

## 2022-03-30 LAB — CUP PACEART REMOTE DEVICE CHECK
Date Time Interrogation Session: 20231022231551
Implantable Pulse Generator Implant Date: 20220209

## 2022-04-21 NOTE — Progress Notes (Signed)
Carelink Summary Report / Loop Recorder 

## 2022-05-03 ENCOUNTER — Ambulatory Visit (INDEPENDENT_AMBULATORY_CARE_PROVIDER_SITE_OTHER): Payer: HMO

## 2022-05-03 DIAGNOSIS — I639 Cerebral infarction, unspecified: Secondary | ICD-10-CM | POA: Diagnosis not present

## 2022-05-04 LAB — CUP PACEART REMOTE DEVICE CHECK
Date Time Interrogation Session: 20231126232259
Implantable Pulse Generator Implant Date: 20220209

## 2022-05-21 NOTE — Progress Notes (Unsigned)
Date:  05/24/2022   ID:  [name], DOB [birth year], MRN [id number]  Patient Location:  [place] [address]   Provider location:   [facility], [place] office  PCP:  Leonides Sake, MD  Cardiologist:  Patsy Baltimore  Chief Complaint  Patient presents with   3 month follow up     Patient c/o shortness of breath and fatigue. Medications reviewed by the patient verbally.     History of Present Illness:    William Mueller is a 71 y.o. male past medical history of long history of smoking, COPD,  chronic renal insufficiency,  CR 1.7 CAD Admission 12/11/2013 to [facility] with chest pain cardiac catheterization lab : occluded mid RCA. Stent was placed.  catheterization in October 2016 with patent stent,disease of a small diagonal and distal circumflex not amenable to intervention.  paroxysmal atrial tachycardia Cardiac catheterization April 2017 History of alcohol abuse Catheterization April 2020, chest pain felt secondary to small vessel disease loop recorder implant. 07/16/2020, out of concern for stroke, rule out A-fib He presents today for follow-up of his coronary artery disease, stable angina and COPD   LOV with myself June 2023  Loop monitor downloads reviewed, no significant arrhythmia, no atrial fibrillation  On lasix 5 days a week with potassium, Reports developing some leg swelling since amlodipine was started  Followed by nephrology: Festus Aloe, Dr. Di Kindle  On oxygen daytime as well as sleeping with oxygen 2 L Reports anxiety well-controlled No chest pain, denies significant bronchitis symptoms Followed by pulmonary, was told not to get RSV vaccine  On a prior clinic visit reported that he stopped drinking beers Prior heavy ETOH  Linq in place: no arrhythmia, download 05/09/2021  EKG personally reviewed by myself on todays visit Normal sinus rhythm with rate 75 bpm nonspecific ST abnormality  Other past medical  history reviewed Seen by EP 07/16/2020 May 07, 2020 concern for a stroke. Seen in the emergency department where imaging confirmed an acute infarct in his inferior left frontal lobe.  recommended to continue aspirin and Plavix indefinitely.   ZIO monitor performed after the event showed no episodes of atrial fibrillation   cardiac catheterization 09/06/2018  grossly patent vessels with Significant disease of a small diagonal branch and small distal left circumflex branches  other major branches of the RCA, left circumflex, LAD and large diagonal are intact with no significant disease  Mid Cx to Dist Cx lesion is 90% stenosed. Ost 1st Diag to 1st Diag lesion is 80% stenosed. Previously placed Prox RCA to Mid RCA stent (unknown type) is widely patent. Prox RCA lesion is 35% stenosed. The left ventricular ejection fraction is 55-65% by visual estimate. The left ventricular systolic function is normal. LV end diastolic pressure is normal. There is no mitral valve regurgitation. Prox Cx lesion is 30% stenosed.    : Medical management start Ranexa 500 mg twice daily for 1 week then up to 1000 mg twice daily It was felt his chest pain was secondary to small vessel disease  Lab work reviewed   total cholesterol 125 LDL 59 creatinine 1.73 normal electrolytes and LFTs   Past Medical History:  Diagnosis Date   Anginal pain ([place]) 06/2017   Anxiety    Bronchitis 06/2017   Chronic chest pain    Chronic kidney disease (CKD) stage G3b/A1, moderately decreased glomerular filtration rate (GFR) between 30-44 mL/min/1.73 square meter and albuminuria creatinine ratio less than 30  mg/g (HCC)    Chronic lower back pain    WITH LEG WEAKNESS   COPD (chronic obstructive pulmonary disease) (HCC)    EMPHYSEMA, O2 AT 2L PRN   Coronary artery disease    a. 12/2013 PCI: mRCA 100% with L to R collats s/p PCI/DES;  b. 06/2014 MV: no ischemia/infarct, EF 53%;  c. 03/2015 Cath: LM nl, LAD nl, D1 80 (1.32m), LCX  94m<1.52m3m OM1 nl, RCA 55p, patent stent, RPDA nl, EF 65%; d. 09/2015 Cath: LM nl, LAD 59m61m 80 (<2mm)74mCX 59m/d152m.52mm), 59m nl, RCA 55p (FFR 0.93), patent stent, RPDA nl-->Med Rx.   Cryptogenic stroke (HCC) 12[place]/2021   Some right sided weakness and balance issues   Daily headache    Depression    Diastolic dysfunction    a. echo 12/2013: EF 55-60%, mild LVH, GR1DD, inf HK, elevated CVP, mildly dilated IVC suggestive of increased RA pressure   Dyspnea    WHEEZING   GERD (gastroesophageal reflux disease)    REFLUX   Hearing loss    High cholesterol    Hypertension    Myocardial infarction (HCC) 20[place]  Nicotine addiction    a. using eCigs.   Orthopnea    Sleep apnea    No CPAP   Status post placement of implantable loop recorder 07/16/2020   Vertigo    Past Surgical History:  Procedure Laterality Date   CARDIAC CATHETERIZATION     MC x 1 stent   CARDIAC CATHETERIZATION Left 03/12/2015   Procedure: Left Heart Cath and Coronary Angiography;  Surgeon: David WLeonie ManLocation[facility];  Service: Cardiovascular;  Laterality: Left;   CARDIAC CATHETERIZATION N/A 09/29/2015   Procedure: Left Heart Cath and Coronary Angiography;  Surgeon: MuhammaWellington HampshireLocation: [facility];  Service: Cardiovascular;  Laterality: N/A;   CATARACT EXTRACTION W/PHACO Right 08/10/2017   Procedure: CATARACT EXTRACTION PHACO AND INTRAOCULAR LENS PLACEMENT (IOC) [place];  Surgeon: BrasingLeandrew KoyanagiLocation: [place]: Ophthalmology;  Laterality: Right;   CATARACT EXTRACTION W/PHACO Left 10/15/2020   Procedure: CATARACT EXTRACTION PHACO AND INTRAOCULAR LENS PLACEMENT (IOC) [place]  Surgeon: BrasingLeandrew KoyanagiLocation: [place]: Ophthalmology;  Laterality: Left;  sleep apnea CDE 15.52 1:55.5 minutes 13.5%   CORONARY ANGIOPLASTY     STENT PLACEMENT   LEFT HEART CATH AND CORONARY ANGIOGRAPHY N/A 09/06/2018   Procedure:  LEFT HEART CATH AND CORONARY ANGIOGRAPHY;  Surgeon: Kemoni Quesenberry,Minna MerrittsLocation: [facility] [place];  Service: Cardiovascular;  Laterality: N/A;   LEFT HEART CATH AND CORONARY ANGIOGRAPHY N/A 02/05/2022   Procedure: LEFT HEART CATH AND CORONARY ANGIOGRAPHY;  Surgeon: Arida, Wellington HampshireLocation[facility] [place];  Service: Cardiovascular;  Laterality: N/A;   LEFT HEART CATHETERIZATION WITH CORONARY ANGIOGRAM N/A 12/12/2013   Procedure: LEFT HEART CATHETERIZATION WITH CORONARY ANGIOGRAM;  Surgeon: MuhammaWellington HampshireLocation: [facility];  Service: Cardiovascular;  Laterality: N/A;     Current Outpatient Medications on File Prior to Visit  Medication Sig Dispense Refill   acetaminophen (TYLENOL) 500 MG tablet Take 1,000 mg by mouth daily as needed for headache.      albuterol (VENTOLIN HFA) 108 (90 Base) MCG/ACT inhaler Inhale 2 puffs into the lungs every 6 (six) hours as needed for wheezing or shortness of breath. 8 g 6   ALPRAZolam (XANAX XR) 2 MG 24 hr tablet Take 2 mg  by mouth every morning.     aspirin 81 MG EC tablet Take 1 tablet (81 mg total) by mouth daily. 90 tablet 3   clopidogrel (PLAVIX) 75 MG tablet Take 1 tablet by mouth once daily 90 tablet 2   COMBIVENT RESPIMAT 20-100 MCG/ACT AERS respimat INHALE 1 PUFF BY MOUTH 4 TIMES DAILY AS NEEDED FOR WHEEZING     Fluticasone-Umeclidin-Vilant (TRELEGY ELLIPTA) 100-62.5-25 MCG/ACT AEPB Inhale 1 puff into the lungs daily. 60 each 5   metoprolol succinate (TOPROL-XL) 100 MG 24 hr tablet Take 1 tablet (100 mg total) by mouth 2 (two) times daily. 180 tablet 3   nitroGLYCERIN (NITROSTAT) 0.4 MG SL tablet Place 1 tablet (0.4 mg total) under the tongue every 5 (five) minutes as needed for chest pain. 25 tablet 0   OXYGEN Inhale 2 L into the lungs daily as needed (oxygen).      pantoprazole (PROTONIX) 40 MG tablet Take 40 mg by mouth daily.     roflumilast (DALIRESP) 500 MCG TABS tablet Take 1 tablet (500 mcg total) by mouth daily. 30  tablet 6   predniSONE (DELTASONE) 5 MG tablet TAKE 1 TABLET BY MOUTH ONCE DAILY FOR 90 DAYS (Patient not taking: Reported on 05/24/2022)     sulfamethoxazole-trimethoprim (BACTRIM) 400-80 MG tablet TAKE 1 TABLET BY MOUTH 3 TIMES WEEKLY (Patient not taking: Reported on 05/24/2022)     No current facility-administered medications on file prior to visit.    Allergies:   Paroxetine hcl, Serotonin reuptake inhibitors (ssris), Diazepam, Doxycycline monohydrate, Escitalopram oxalate, Lorazepam, and Tetracyclines & related   Social History   Tobacco Use   Smoking status: Former    Packs/day: 3.00    Years: 48.00    Total pack years: 144.00    Types: E-cigarettes, Cigarettes    Quit date: 06/17/2009    Years since quitting: 12.9   Smokeless tobacco: Never   Tobacco comments:    quit e-cigs 03/2020  Vaping Use   Vaping Use: Former   Quit date: 04/15/2020  Substance Use Topics   Alcohol use: Yes    Alcohol/week: 6.0 standard drinks of alcohol    Types: 6 Cans of beer per week    Comment: Occasional    Drug use: No     Family Hx: The patient's family history includes CVA in his mother; Coronary artery disease in his brother and father; Heart disease in his father; Heart disease (age of onset: 74) in his brother. There is no history of Kidney disease or Prostate cancer.  ROS:   Please see the history of present illness.    Review of Systems  Constitutional: Negative.   HENT: Negative.    Respiratory:  Positive for shortness of breath.   Cardiovascular:  Positive for leg swelling.  Gastrointestinal: Negative.   Musculoskeletal: Negative.   Neurological: Negative.   Psychiatric/Behavioral: Negative.    All other systems reviewed and are negative.    Labs/Other Tests and Data Reviewed:    Recent Labs: 01/30/2022: Hemoglobin 11.5; Platelets 250 02/19/2022: BUN 23; Creatinine, Ser 2.19; Potassium 4.5; Sodium 137   Recent Lipid Panel Lab Results  Component Value Date/Time   CHOL  98 05/08/2020 04:35 AM   CHOL 114 07/23/2019 11:55 AM   TRIG 79 05/08/2020 04:35 AM   HDL 31 (L) 05/08/2020 04:35 AM   HDL 38 (L) 07/23/2019 11:55 AM   CHOLHDL 3.2 05/08/2020 04:35 AM   LDLCALC 51 05/08/2020 04:35 AM   LDLCALC 53 07/23/2019 11:55 AM  Wt Readings from Last 3 Encounters:  05/24/22 194 lb 4 oz (88.1 kg)  02/19/22 193 lb 9.6 oz (87.8 kg)  02/05/22 191 lb (86.6 kg)     Exam:    BP 132/80 (BP Location: Left Arm, Patient Position: Sitting, Cuff Size: Normal)   Pulse 75   Ht '5\' 9"'$  (1.753 m)   Wt 194 lb 4 oz (88.1 kg)   BMI 28.69 kg/m  Constitutional:  oriented to person, place, and time. No distress.  HENT:  Head: Grossly normal Eyes:  no discharge. No scleral icterus.  Neck: No JVD, no carotid bruits  Cardiovascular: Regular rate and rhythm, no murmurs appreciated Pulmonary/Chest: Moderately decreased breath sounds throughout Abdominal: Soft.  no distension.  no tenderness.  Musculoskeletal: Normal range of motion Neurological:  normal muscle tone. Coordination normal. No atrophy Skin: Skin warm and dry Psychiatric: normal affect, pleasant  ASSESSMENT & PLAN:    Atherosclerosis of native coronary artery of native heart with unstable angina pectoris [facility]) Prior catheterization 2020, medical management recommended, likely small vessel disease Denies anginal symptoms on today's visit Cholesterol at goal  Atrial tachycardia, paroxysmal (HCC) No arrhythmia on loop download Tolerating metoprolol succinate 100 twice daily  Stroke documented on CT scan On aspirin Plavix, cholesterol at goal Quit smoking No arrhythmia on loop, no atrial fibrillation on review of downloads  CKD (chronic kidney disease) stage 3, GFR 30-59 ml/min (HCC) Followed by nephrology Known chronic kidney disease, creatinine of 2 Remains on Lasix and potassium 5 days a week  Centrilobular emphysema (HCC) On chronic oxygen, Stopped smoking Not very active, limited by COPD Reports  he does not go outside the house much  Tremor Prior history of alcohol, may be contributing to tremor Deconditioned, poor diet Reports that he stopped drinking  Essential hypertension Recommend he stop amlodipine given left ankle swelling, we will change to hydralazine 25 twice daily   Total encounter time more than 30 minutes  Greater than 50% was spent in counseling and coordination of care with the patient    Signed, Ida Rogue, MD  05/24/2022 2:33 PM    [facility] [address], [place], [place] [address]

## 2022-05-24 ENCOUNTER — Encounter: Payer: Self-pay | Admitting: Cardiovascular Disease

## 2022-05-24 ENCOUNTER — Ambulatory Visit: Payer: PPO | Attending: Cardiovascular Disease | Admitting: Cardiovascular Disease

## 2022-05-24 ENCOUNTER — Other Ambulatory Visit: Payer: Self-pay | Admitting: Cardiovascular Disease

## 2022-05-24 VITALS — BP 132/80 | HR 75 | Ht 69.0 in | Wt 194.2 lb

## 2022-05-24 DIAGNOSIS — J9611 Chronic respiratory failure with hypoxia: Secondary | ICD-10-CM

## 2022-05-24 DIAGNOSIS — I471 Supraventricular tachycardia, unspecified: Secondary | ICD-10-CM | POA: Diagnosis not present

## 2022-05-24 DIAGNOSIS — E785 Hyperlipidemia, unspecified: Secondary | ICD-10-CM

## 2022-05-24 DIAGNOSIS — I639 Cerebral infarction, unspecified: Secondary | ICD-10-CM

## 2022-05-24 DIAGNOSIS — I10 Essential (primary) hypertension: Secondary | ICD-10-CM

## 2022-05-24 DIAGNOSIS — I25118 Atherosclerotic heart disease of native coronary artery with other forms of angina pectoris: Secondary | ICD-10-CM | POA: Diagnosis not present

## 2022-05-24 DIAGNOSIS — N183 Chronic kidney disease, stage 3 unspecified: Secondary | ICD-10-CM

## 2022-05-24 DIAGNOSIS — I4719 Other supraventricular tachycardia: Secondary | ICD-10-CM | POA: Diagnosis not present

## 2022-05-24 DIAGNOSIS — I5189 Other ill-defined heart diseases: Secondary | ICD-10-CM

## 2022-05-24 MED ORDER — EZETIMIBE 10 MG PO TABS: ORAL_TABLET | ORAL | 3 refills | Status: DC

## 2022-05-24 MED ORDER — ATORVASTATIN CALCIUM 80 MG PO TABS: 80.0000 mg | ORAL_TABLET | Freq: Every day | ORAL | 3 refills | Status: DC

## 2022-05-24 MED ORDER — FUROSEMIDE 20 MG PO TABS: ORAL_TABLET | ORAL | 3 refills | Status: DC

## 2022-05-24 MED ORDER — HYDRALAZINE HCL 25 MG PO TABS: 25.0000 mg | ORAL_TABLET | Freq: Two times a day (BID) | ORAL | 3 refills | Status: DC

## 2022-05-24 MED ORDER — KLOR-CON M20 20 MEQ PO TBCR: 20.0000 meq | EXTENDED_RELEASE_TABLET | Freq: Every day | ORAL | 3 refills | Status: DC

## 2022-05-24 NOTE — Telephone Encounter (Signed)
Pt has visit today Dr. Rockey Situ can address refill at visit.

## 2022-05-24 NOTE — Telephone Encounter (Signed)
Please advise if ok to refill Historical Provider.

## 2022-05-24 NOTE — Patient Instructions (Addendum)
Medication Instructions:  Hold Amlodipine 5 mg tablet.  Stop Hydralazine 10 mg tablet.  Start hydralazine 25 mg twice a day  If you need a refill on your cardiac medications before your next appointment, please call your pharmacy.   Lab work: No new labs needed  Testing/Procedures: No new testing needed  Follow-Up: At [facility], you and your health needs are our priority.  As part of our continuing mission to provide you with exceptional heart care, we have created designated Provider Care Teams.  These Care Teams include your primary Cardiologist (physician) and Advanced Practice Providers (APPs -  Physician Assistants and Nurse Practitioners) who all work together to provide you with the care you need, when you need it.  You will need a follow up appointment in 12 months  Providers on your designated Care Team:   Murray Hodgkins, NP Christell Faith, PA-C Cadence Kathlen Mody, [place]  COVID-19 Vaccine Information can be found at: [email or web address].uk For questions related to vaccine distribution or appointments, please email [email or web address] or call [phone].

## 2022-06-08 ENCOUNTER — Ambulatory Visit (INDEPENDENT_AMBULATORY_CARE_PROVIDER_SITE_OTHER): Payer: HMO

## 2022-06-08 DIAGNOSIS — I639 Cerebral infarction, unspecified: Secondary | ICD-10-CM

## 2022-06-08 LAB — CUP PACEART REMOTE DEVICE CHECK
Date Time Interrogation Session: 20240101231414
Implantable Pulse Generator Implant Date: 20220209

## 2022-06-14 NOTE — Progress Notes (Signed)
Carelink Summary Report / Loop Recorder 

## 2022-07-08 NOTE — Progress Notes (Signed)
Carelink Summary Report / Loop Recorder

## 2022-07-11 LAB — CUP PACEART REMOTE DEVICE CHECK
Date Time Interrogation Session: 20240203231635
Implantable Pulse Generator Implant Date: 20220209

## 2022-07-12 ENCOUNTER — Ambulatory Visit: Payer: PPO

## 2022-07-12 DIAGNOSIS — I639 Cerebral infarction, unspecified: Secondary | ICD-10-CM | POA: Diagnosis not present

## 2022-07-23 ENCOUNTER — Other Ambulatory Visit: Payer: Self-pay | Admitting: Cardiovascular Disease

## 2022-07-28 ENCOUNTER — Telehealth: Payer: Self-pay | Admitting: Cardiovascular Disease

## 2022-07-28 NOTE — Telephone Encounter (Signed)
Pt c/o medication issue:  1. Name of Medication:   hydrALAZINE (APRESOLINE) 25 MG tablet   2. How are you currently taking this medication (dosage and times per day)?   3. Are you having a reaction (difficulty breathing--STAT)?   4. What is your medication issue?   Caller stated wife said patient is not taking this medication because patient felt he was not emptying his bladder as he should.  Caller stated wife reported patient's BP: 2/19 BP 131/71, HR 71 and 2/18 - BP 131/73, HR 63.  Caller stated can follow-up with wife or patient directly.

## 2022-08-02 NOTE — Telephone Encounter (Signed)
Spoke with patient and relayed Dr. Donivan Scull recommendations as follows:  Two blood pressure numbers detailed look pretty good Would recommend he continue to monitor blood pressure without hydralazine Thx TGollan   Patient understood recommendations with read back

## 2022-08-16 ENCOUNTER — Ambulatory Visit (INDEPENDENT_AMBULATORY_CARE_PROVIDER_SITE_OTHER): Payer: PPO

## 2022-08-16 DIAGNOSIS — I639 Cerebral infarction, unspecified: Secondary | ICD-10-CM

## 2022-08-17 LAB — CUP PACEART REMOTE DEVICE CHECK
Date Time Interrogation Session: 20240310232114
Implantable Pulse Generator Implant Date: 20220209

## 2022-08-25 NOTE — Progress Notes (Signed)
Carelink Summary Report / Loop Recorder 

## 2022-09-20 ENCOUNTER — Ambulatory Visit (INDEPENDENT_AMBULATORY_CARE_PROVIDER_SITE_OTHER): Payer: PPO

## 2022-09-20 DIAGNOSIS — I639 Cerebral infarction, unspecified: Secondary | ICD-10-CM

## 2022-09-20 LAB — CUP PACEART REMOTE DEVICE CHECK
Date Time Interrogation Session: 20240412231237
Implantable Pulse Generator Implant Date: 20220209

## 2022-09-23 NOTE — Progress Notes (Signed)
Carelink Summary Report / Loop Recorder 

## 2022-09-26 ENCOUNTER — Other Ambulatory Visit: Payer: Self-pay | Admitting: Pulmonary Disease

## 2022-10-19 ENCOUNTER — Other Ambulatory Visit: Payer: Self-pay | Admitting: Cardiovascular Disease

## 2022-10-19 NOTE — Telephone Encounter (Signed)
Confirmed with patient's wife that he is taking Furosemide 20 mg 1 tab 5 days a week.  Will send refill as documented and as taking.

## 2022-10-19 NOTE — Telephone Encounter (Signed)
last visit 05/24/22--Followed by nephrology Known chronic kidney disease, creatinine of 2 Remains on Lasix and potassium 5 days a week  You will need a follow up appointment in 12 months

## 2022-10-19 NOTE — Telephone Encounter (Signed)
Called pharmacy to confirm dose of last rx.  The refill was for 20mg  2 QD but only quantity of 15.

## 2022-10-21 LAB — CUP PACEART REMOTE DEVICE CHECK
Date Time Interrogation Session: 20240515231643
Implantable Pulse Generator Implant Date: 20220209

## 2022-10-25 ENCOUNTER — Ambulatory Visit (INDEPENDENT_AMBULATORY_CARE_PROVIDER_SITE_OTHER): Payer: PPO

## 2022-10-25 DIAGNOSIS — I639 Cerebral infarction, unspecified: Secondary | ICD-10-CM | POA: Diagnosis not present

## 2022-10-25 NOTE — Progress Notes (Signed)
Carelink Summary Report / Loop Recorder 

## 2022-11-22 NOTE — Progress Notes (Signed)
Carelink Summary Report / Loop Recorder 

## 2022-11-29 ENCOUNTER — Ambulatory Visit (INDEPENDENT_AMBULATORY_CARE_PROVIDER_SITE_OTHER): Payer: PPO

## 2022-11-29 DIAGNOSIS — I639 Cerebral infarction, unspecified: Secondary | ICD-10-CM

## 2022-11-29 LAB — CUP PACEART REMOTE DEVICE CHECK
Date Time Interrogation Session: 20240623231245
Implantable Pulse Generator Implant Date: 20220209

## 2022-12-15 ENCOUNTER — Emergency Department
Admission: EM | Admit: 2022-12-15 | Discharge: 2022-12-15 | Disposition: A | Discharge status: Home / Self Care | Payer: PPO | Attending: Emergency Medicine | Admitting: Emergency Medicine

## 2022-12-15 ENCOUNTER — Encounter: Payer: Self-pay | Admitting: Emergency Medicine

## 2022-12-15 ENCOUNTER — Other Ambulatory Visit: Payer: Self-pay

## 2022-12-15 DIAGNOSIS — I10 Essential (primary) hypertension: Secondary | ICD-10-CM | POA: Diagnosis present

## 2022-12-15 LAB — BASIC METABOLIC PANEL
Anion gap: 9 (ref 5–15)
BUN: 20 mg/dL (ref 8–23)
CO2: 28 mmol/L (ref 22–32)
Calcium: 9.1 mg/dL (ref 8.9–10.3)
Chloride: 99 mmol/L (ref 98–111)
Creatinine, Ser: 1.8 mg/dL — ABNORMAL HIGH (ref 0.61–1.24)
GFR, Estimated: 39 mL/min — ABNORMAL LOW (ref >60)
Glucose, Bld: 109 mg/dL — ABNORMAL HIGH (ref 70–99)
Potassium: 4.7 mmol/L (ref 3.5–5.1)
Sodium: 136 mmol/L (ref 135–145)

## 2022-12-15 LAB — CBC
HCT: 40.4 % (ref 39.0–52.0)
Hemoglobin: 13 g/dL (ref 13.0–17.0)
MCH: 26.9 pg (ref 26.0–34.0)
MCHC: 32.2 g/dL (ref 30.0–36.0)
MCV: 83.6 fL (ref 80.0–100.0)
Platelets: 233 10*3/uL (ref 150–400)
RBC: 4.83 MIL/uL (ref 4.22–5.81)
RDW: 12.9 % (ref 11.5–15.5)
WBC: 10.3 10*3/uL (ref 4.0–10.5)
nRBC: 0 % (ref 0.0–0.2)

## 2022-12-15 LAB — TROPONIN I (HIGH SENSITIVITY): Troponin I (High Sensitivity): 8 ng/L (ref <18)

## 2022-12-15 MED ORDER — CLONIDINE HCL 0.1 MG PO TABS
0.2000 mg | ORAL_TABLET | Freq: Once | ORAL | Status: AC
  Administered 2022-12-15: 0.2 mg via ORAL
  Filled 2022-12-15: qty 2

## 2022-12-15 NOTE — ED Triage Notes (Signed)
Pt via POV from [facility]. Pt was at his routine appointment with Pulmonology. States Pulmonology sent pt over to [facility] for hypertension. KC reports HTN at 213/101. Pt does have a hx but states he has taken all of his medications. KC also reported HR of 180 and 80% on pt's chronic 2L. KC states they are unable to get a ECG done. Pt denies any chest pain and denies any SOB at this time. Pt has a hx of COPD and MI. Pt is anxious and shaky on arrival but denies any other symptoms. Pt is A&Ox4 and NAD  On arrival to the ED, ECG was done showing a HR of 70, also placed pt on 2L [place] and O2 maintained 100% on 2L

## 2022-12-15 NOTE — ED Provider Notes (Signed)
[facility] Provider Note    Event Date/Time   First MD Initiated Contact with Patient 12/15/22 1316     (approximate)   History   Hypertension   HPI  William Mueller is a 72 y.o. male with history of high blood pressure sent in from pulmonologist office for elevated blood pressure.  Reportedly was over 200 systolic.  He denies chest pain.  He reports his breathing is at baseline.  Reports compliance with his medications     Physical Exam   Triage Vital Signs: ED Triage Vitals  Enc Vitals Group     BP 12/15/22 1316 (!) 201/131     Pulse Rate 12/15/22 1316 70     Resp 12/15/22 1316 20     Temp 12/15/22 1316 98.4 F (36.9 C)     Temp src --      SpO2 12/15/22 1316 100 %     Weight 12/15/22 1314 85.3 kg (188 lb)     Height 12/15/22 1314 1.753 m (5\' 9" )     Head Circumference --      Peak Flow --      Pain Score 12/15/22 1314 0     Pain Loc --      Pain Edu? --      Excl. in GC? --     Most recent vital signs: Vitals:   12/15/22 1430 12/15/22 1440  BP: 126/73 129/74  Pulse: (!) 57 61  Resp: 16 14  Temp:    SpO2: 100% 100%     General: Awake, no distress.  CV:  Good peripheral perfusion.  Resp:  Normal effort.  Abd:  No distention.  Other:     ED Results / Procedures / Treatments   Labs (all labs ordered are listed, but only abnormal results are displayed) Labs Reviewed  BASIC METABOLIC PANEL - Abnormal; Notable for the following components:      Result Value   Glucose, Bld 109 (*)    Creatinine, Ser 1.80 (*)    GFR, Estimated 39 (*)    All other components within normal limits  CBC  TROPONIN I (HIGH SENSITIVITY)     EKG  ED ECG REPORT I, Jene Every, the attending physician, personally viewed and interpreted this ECG.  Date: 12/15/2022  Rhythm: normal sinus rhythm QRS Axis: normal Intervals: normal ST/T Wave abnormalities: normal Narrative Interpretation: no evidence of acute  ischemia    RADIOLOGY     PROCEDURES:  Critical Care performed:   Procedures   MEDICATIONS ORDERED IN ED: Medications  cloNIDine (CATAPRES) tablet 0.2 mg (0.2 mg Oral Given 12/15/22 1345)     IMPRESSION / MDM / ASSESSMENT AND PLAN / ED COURSE  I reviewed the triage vital signs and the nursing notes. Patient's presentation is most consistent with exacerbation of chronic illness.  Patient with a history of high blood pressure presents with elevated blood pressure with systolic over 200.  Will check labs, give p.o. clonidine.  Patient is asymptomatic and well-appearing  Lab work is unremarkable, creatinine is improved from prior  Blood pressure normalized after treatment, 129/74.  Appropriate for discharge at this time, no indication for admission       FINAL CLINICAL IMPRESSION(S) / ED DIAGNOSES   Final diagnoses:  Primary hypertension     Rx / DC Orders   ED Discharge Orders     None        Note:  This document was prepared using Dragon voice recognition software  and may include unintentional dictation errors.   Jene Every, MD 12/15/22 [phone]

## 2022-12-17 NOTE — Progress Notes (Signed)
Carelink Summary Report / Loop Recorder 

## 2022-12-20 ENCOUNTER — Telehealth: Payer: Self-pay | Admitting: *Deleted

## 2022-12-20 NOTE — Telephone Encounter (Signed)
Transition Care Management Follow-up Telephone Call Date of discharge and from where: 12/15/2022 How have you been since you were released from the hospital? Blood pressure up still pcp cannot see him until a week from today on 12/27/2022 Any questions or concerns? No  Items Reviewed: Did the pt receive and understand the discharge instructions provided? Yes  Medications obtained and verified? Yes  Other? No  Any new allergies since your discharge? Yes  Dietary orders reviewed? No Do you have support at home? Yes     Follow up appointments reviewed:  PCP Hospital f/u appt confirmed? Yes  Scheduled to see on 12/27/2022 no availability until then   Are transportation arrangements needed? No  If their condition worsens, is the pt aware to call PCP or go to the Emergency Dept.? Yes Was the patient provided with contact information for the PCP's office or ED? Yes Was to pt encouraged to call back with questions or concerns? Yes  Alois Cliche -Berneda Rose [facility], Population Health [phone] [address] [place] , [place] [address] Email : [email or web address]

## 2023-01-03 ENCOUNTER — Ambulatory Visit (INDEPENDENT_AMBULATORY_CARE_PROVIDER_SITE_OTHER): Payer: PPO

## 2023-01-03 DIAGNOSIS — I639 Cerebral infarction, unspecified: Secondary | ICD-10-CM | POA: Diagnosis not present

## 2023-01-07 ENCOUNTER — Other Ambulatory Visit: Payer: Self-pay | Admitting: Physician Assistant

## 2023-01-07 DIAGNOSIS — I471 Supraventricular tachycardia, unspecified: Secondary | ICD-10-CM

## 2023-01-07 DIAGNOSIS — I25118 Atherosclerotic heart disease of native coronary artery with other forms of angina pectoris: Secondary | ICD-10-CM

## 2023-01-07 DIAGNOSIS — I10 Essential (primary) hypertension: Secondary | ICD-10-CM

## 2023-01-19 NOTE — Progress Notes (Signed)
Carelink Summary Report / Loop Recorder 

## 2023-01-25 ENCOUNTER — Other Ambulatory Visit: Payer: Self-pay

## 2023-01-28 ENCOUNTER — Telehealth: Payer: Self-pay | Admitting: Cardiovascular Disease

## 2023-01-28 ENCOUNTER — Ambulatory Visit: Payer: PPO | Admitting: Cardiology

## 2023-01-28 ENCOUNTER — Encounter: Payer: Self-pay | Admitting: Cardiology

## 2023-01-28 VITALS — BP 144/88 | HR 63 | Ht 69.0 in | Wt 184.2 lb

## 2023-01-28 DIAGNOSIS — I200 Unstable angina: Secondary | ICD-10-CM

## 2023-01-28 DIAGNOSIS — E785 Hyperlipidemia, unspecified: Secondary | ICD-10-CM

## 2023-01-28 DIAGNOSIS — I4719 Other supraventricular tachycardia: Secondary | ICD-10-CM

## 2023-01-28 DIAGNOSIS — N183 Chronic kidney disease, stage 3 unspecified: Secondary | ICD-10-CM

## 2023-01-28 DIAGNOSIS — I471 Supraventricular tachycardia, unspecified: Secondary | ICD-10-CM

## 2023-01-28 DIAGNOSIS — B372 Candidiasis of skin and nail: Secondary | ICD-10-CM

## 2023-01-28 DIAGNOSIS — I10 Essential (primary) hypertension: Secondary | ICD-10-CM

## 2023-01-28 DIAGNOSIS — Z79899 Other long term (current) drug therapy: Secondary | ICD-10-CM

## 2023-01-28 DIAGNOSIS — I25118 Atherosclerotic heart disease of native coronary artery with other forms of angina pectoris: Secondary | ICD-10-CM

## 2023-01-28 DIAGNOSIS — R072 Precordial pain: Secondary | ICD-10-CM

## 2023-01-28 DIAGNOSIS — J9611 Chronic respiratory failure with hypoxia: Secondary | ICD-10-CM

## 2023-01-28 DIAGNOSIS — I639 Cerebral infarction, unspecified: Secondary | ICD-10-CM

## 2023-01-28 MED ORDER — NYSTATIN 100000 UNIT/GM EX CREA: 1.0000 | TOPICAL_CREAM | Freq: Two times a day (BID) | CUTANEOUS | 0 refills | Status: DC

## 2023-01-28 NOTE — Telephone Encounter (Signed)
   Pt c/o of Chest Pain: STAT if active CP, including tightness, pressure, jaw pain, radiating pain to shoulder/upper arm/back, CP unrelieved by Nitro. Symptoms reported of SOB, nausea, vomiting, sweating.  1. Are you having CP right now? No    2. Are you experiencing any other symptoms (ex. SOB, nausea, vomiting, sweating)? SOB - all the time per pt   3. Is your CP continuous or coming and going? Come and go   4. Have you taken Nitroglycerin? Yes - yesterday   5. How long have you been experiencing CP? 2 weeks or more    6. If NO CP at time of call then end call with telling Pt to call back or call 911 if Chest pain returns prior to return call from triage team.

## 2023-01-28 NOTE — Telephone Encounter (Signed)
Call pt and spoke to wife, William Mueller, with pt answering questions in the background, pt not a concise historian but able to get close to some signs and symptoms as noted:  LOCATION: left breast RADIATION: no radiation ONSET: "When did the chest pain begin?" began 2 weeks ago- last occurrence about 0900 today, sharp pain 8/10, unsure if increasing in quality, yesterday morning occurrence 2/10  PATTERN: "Does the pain come and go, or has it been constant since it started?"  "Does it get worse with exertion?" no pattern - today sitting at computer, yesterday lying in bed DURATION: "How long does it last" "a few seconds to a minute or two"  CARDIAC RISK FACTORS: "Do you have any history of heart problems or risk factors for heart disease?" COPD, former smoker, chronic renal insufficiency, CAD, Admission 12/2013 with chest pain cath lab occluded mid RCA. Stent and multiple subsequent cath hx OTHER SYMPTOMS: Endorses: nausea with today's episode, and sweating with  yesterday's episode,  Denies: vomiting, fever, new or worsening difficulty breathing/SOB, cough   Pt's wife, William Mueller, noted that pt used SL nitroglycerin x 1 yesterday with relief.  Pt's wife, William Mueller, would like to be seen today, appt made, and aware to arrive at 1:40pm.  Pt and wife directed to call 911 if severity or frequency of pain increases, or if any other symptoms occur that are concerning to pt's health.  Wife verbally acknowledges instructions.

## 2023-01-28 NOTE — H&P (View-Only) (Signed)
Cardiology Office Note:  .   Date:  01/28/2023  ID:  William Mueller, DOB 01-11-1951, MRN [id number] PCP: Ailene Ravel, MD  [facility] Providers Cardiologist:  Julien Nordmann, MD { Click to update primary MD,subspecialty MD or APP then REFRESH:1}   History of Present Illness: .   William Mueller is a 72 y.o. male with a past medical history of CAD status post PCI to the RCA in 12/2013 with chronic chest pain status post multiple repeat caths outlined below, PSVT, CKD Stage III, Cryptogenic stroke status post ILR monitored by EP, COPD secondary to long history of tobacco use with E-Cigarette use on home oxygen, HFpEF, Paroxysmal atrial tachycardia, labile hypertension with orthostatic hypotension, anemia, depression, anxiety, hypertension, hyperlipidemia, GERD, who presents Today As a Work in with complaints of chest pain.  He was admitted to [facility] on 12/2018 15 with chest pain.  LHC showed left main was normal, mid LAD diffuse 20%, ostial D1 50%, proximal D1 50%, D2 30%, mid left circumflex moderately calcified with 30%, RCA mildly calcified with diffuse 30% proximally.  Vessel was occluded in the midsegment.  Faint left-to-right collaterals were noted.  Successful PCI/DES to the RCA.  Echocardiogram revealed EF 55 to 60%, mild LVH, G1DD, elevated RA pressure.  He was admitted again 02/2014 with chest pain, ruled out, left AMA.  Admitted 02/2015 with chest pain.  Ruled out.  Underwent LHC that showed patent RCA stent with proximal RCA 55% stenosis, D1 80%, mid left circumflex to distal left circumflex 90%.  Admitted 09/2015 with chest pain, ruled out, left heart catheter patent RCA stent with unchanged nonobstructive disease when compared to prior LHC.  FFR of the RCA lesion nonsignificant at 0.93.  Medical management was advised.  Repeat echo in 06/2017 showed EF 65 to 70%, RWMA, G2DD.  Admitted in 09/2018 with recurrent chest pain.  Troponin mildly elevated peaking at 0.04.  Repeat LHC showed left main  without significant disease, D1 80%, proximal left circumflex 30%, mid to distal left circumflex 90%, proximal RCA 30% prior to the previously placed in the proximal to mid region without significant ISR.  LVEF 55%.  Again medical management was advised send he was started on Ranexa 500 mg twice daily.  On his follow-up visit posthospitalization he had stated that his catheterization had fixed him. He had to be reminded no intervention was performed he was started on antianginal therapy.  He had also stopped the Ranexa on his own stating his chest pain was improved at his follow-up. He also noted a jittery feeling with this medication possible difficulty in emptying his bladder.  Zio patch in 11/2018 showed normal sinus rhythm with average heart rate of 104 bpm 2 SVT runs occurred with the fastest interval lasting 6 beats at a maximal rate of 152 bpm and the longest interval lasting 8 beats with an average rate of 111 bpm.  Follow-up echo on 12/19/2018 showed EF 60 to 65%, diastolic dysfunction, no RWMA, normal RV SF.  He was admitted 12/21 with a cryptogenic stroke.  Echo demonstrated EF 55 to 60%, no RWMA, G1 DD, normal RV systolic function.  Zio patch showed predominant rhythm of sinus rhythm with no evidence of atrial fibs/flutter or significant pauses.  He subsequently underwent a loop implantation with device interrogations showing no evidence of atrial fibs/flutter.  He was admitted to the hospital from 8/25 through 01/30/2022 with complaints of chest pain.  High-sensitivity troponin was 19 with a delta and peak troponin  of 28.  He remained symptom-free in the hospital and was discharged outpatient follow-up.  He was seen for hospital follow-up on 02/02/2022 noted to 3-week history of worsening substernal chest pressure as well as some intermittent elevated BP readings.  LHC 02/05/2022 showed no significant change in coronary anatomy since cardiac catheterization in 2020 with patent RCA stent with no significant  restenosis.  Medical therapy was recommended along with titration of furosemide to 40 mg daily.  Echo on 02/15/2022 revealed EF 55 to 60%, normal RV systolic function, no significant valvular abnormalities.  On follow-up 02/19/2022 continue to complain of chronic dyspnea and fatigue and occasional episodes of chest discomfort.  No other medication changes were made no further testing was ordered at that time.  He was last seen in clinic 05/24/2022 by Dr. Mariah Milling, at that time complained of increased swelling since starting amlodipine.  He remained on Lasix and potassium for 5 days a week.  Send he had stopped smoking and stopped drinking.  Amlodipine was stopped and he was started on hydralazine 25 mg twice daily.  Patient returns to clinic today accompanied by his wife being seen as a work and after calling the triage line complaining of chest pain, chest tightness, jaw pain and radiating pain into his shoulder, upper arm, back.  Chest pain was relieved with nitroglycerin x 1 dose. Associated symptoms of shortness of breath, nausea, vomiting and diaphoresis.  He continued to have chest pain episodes with the last one being this morning.  He has found no exacerbating symptoms but is relieved with nitro.  Originally he thought his symptoms were related to GERD exam but just with the eating symptoms have progressed and he continues to have chest discomfort, chronic shortness of breath that he states is unchanged but continues to have nausea without vomiting and has been diaphoretic with each episode of chest discomfort.  He stated he also was started on antibiotics by his pulmonary provider but he is unsure as to what they were for and since starting those medications he has been feeling worse.  He has not been taking all of his blood pressure medications as prescribed.  Did have one episode where he presented to the emergency department with a systolic blood pressure of over 200 was treated with clonidine in the ER  and was discharged home with follow-up with cardiology.  ROS: 10 point review of systems has been completed and considered negative with exception what is been listed in HPI  Studies Reviewed: Marland Kitchen   EKG Interpretation Date/Time:  Friday January 28 2023 14:54:12 EDT Ventricular Rate:  67 PR Interval:  140 QRS Duration:  72 QT Interval:  428 QTC Calculation: 452 R Axis:   64  Text Interpretation: Normal sinus rhythm ST & T wave abnormality, consider inferior ischemia ST & T wave abnormality, consider anterolateral ischemia When compared with ECG of 15-Dec-2022 13:12, ST now depressed in Anterior leads T wave inversion now evident in Anterior leads Confirmed by Charlsie Quest ([id number]) on 01/28/2023 3:08:42 PM   TTE 02/15/22 1. Left ventricular ejection fraction, by estimation, is 55 to 60%. The  left ventricle has normal function. Left ventricular endocardial border  not optimally defined to evaluate regional wall motion. There is mild left  ventricular hypertrophy. Left  ventricular diastolic parameters were normal.   2. Right ventricular systolic function is normal. The right ventricular  size is normal. Tricuspid regurgitation signal is inadequate for assessing  PA pressure.   3. The mitral valve is normal  in structure. No evidence of mitral valve  regurgitation. No evidence of mitral stenosis.   4. The aortic valve is normal in structure. Aortic valve regurgitation is  not visualized. No aortic stenosis is present.   5. The inferior vena cava is normal in size with greater than 50%  respiratory variability, suggesting right atrial pressure of 3 mmHg.   LHC 02/05/22   Prox LAD to Mid LAD lesion is 40% stenosed.   1st Diag lesion is 80% stenosed.   LPAV lesion is 95% stenosed.   Prox RCA lesion is 50% stenosed.   Previously placed Prox RCA to Mid RCA stent of unknown type is  widely patent.   1.  No significant change in coronary anatomy since most recent cardiac catheterization in 2020.   Patent RCA stent with no significant restenosis.  Stable moderate proximal RCA stenosis and subocclusive disease in a small AV groove left circumflex artery and first diagonal. 2.  Left ventricular angiography was not performed due to chronic kidney disease. 3.  Severely elevated left ventricular end-diastolic pressure 37 mmHg.   Recommendations: Recommend continuing medical therapy for coronary artery disease. I increased the dose of furosemide to 40 mg once daily.  Recommend an outpatient echocardiogram given her recent EF evaluation. Only 18 mL of contrast was used for the procedure.  Zio patch 05/2020: Normal sinus rhythm No patient triggered events min HR of 45 bpm, max HR of 138 bpm, and avg HR of 66 bpm.   8 Supraventricular Tachycardia/atrial tachycardia runs occurred, the run with the fastest interval lasting 5 beats with a max rate of 138 bpm, the longest lasting 13 beats with an avg rate of 110 bpm.    Possible Junctional Rhythm was present.  Isolated SVEs were rare (<1.0%), SVE Couplets were rare (<1.0%), and SVE Triplets were rare (<1.0%). Isolated VEs were rare (<1.0%, 41), VE Couplets were rare (<1.0%, 3), and VE Triplets were rare (<1.0%)   TTE 05/08/2020: 1. Left ventricular ejection fraction, by estimation, is 55 to 60%. The  left ventricle has normal function. The left ventricle has no regional  wall motion abnormalities. Left ventricular diastolic parameters are  consistent with Grade I diastolic  dysfunction (impaired relaxation).   2. Right ventricular systolic function is normal. The right ventricular  size is normal.    TTE 12/19/2018: 1. The left ventricle has normal systolic function with an ejection fraction of 60-65%. The cavity size was normal. There is mildly increased left ventricular wall thickness. Left ventricular diastolic Doppler parameters are consistent with impaired  relaxation. No evidence of left ventricular regional wall motion abnormalities.   2. The right ventricle has normal systolic function. The cavity was normal. There is no increase in right ventricular wall thickness. Right ventricular systolic pressure could not be assessed.  3. The aortic valve was not well visualized   Zio monitor 11/2018: Event Monitor   Normal sinus rhythm Avg HR of 104 bpm.   2 Supraventricular Tachycardia runs occurred, the run with the fastest interval lasting 6 beats with a max rate of 152 bpm,  the longest lasting 8 beats with an avg rate of 111 bpm.    Isolated SVEs were rare (<1.0%), SVE Couplets were rare (<1.0%), and SVE Triplets were rare (<1.0%). Isolated VEs were rare (<1.0%), VE Couplets were rare (<1.0%), and no VE Triplets were present. Ventricular Bigeminy was present.   Patient triggered events were not associated with significant arrhythmia   LHC 09/2018: Coronary angiography:  Coronary dominance:  Right or codominant  Left mainstem:   Large vessel that bifurcates into the LAD and left circumflex, no significant disease noted  Left anterior descending (LAD):   Large vessel that extends to the apical region, diagonal branch 2, first diagonal branch is a small to moderate size vessel, severe diffuse disease predominantly in the proximal region estimated at 80%, second diagonal branch moderate to large size vessel no significant disease  Left circumflex (LCx):  Large vessel with OM branch 2, 30% mid left circumflex disease, distal left circumflex after takeoff of OM 2 is severely diseased, small vessel.  There is a large OM branch with no significant disease  Right coronary artery (RCA):  Right dominant vessel with PL and PDA, no significant disease noted.  30% proximal RCA disease prior to long stent in the proximal to mid region, stent with no significant in-stent restenosis  Left ventriculography: Left ventricular systolic function is normal, LVEF is estimated at 55%, there is no significant mitral regurgitation , no significant  aortic valve stenosis  Final Conclusions:   Significant disease of a small diagonal branch and small distal left circumflex branches Mean branches of the RCA, left circumflex, LAD and large diagonal are intact with no significant disease  Recommendations:  Etiology of his chest pain could be secondary to small vessel disease Pictures reviewed by interventional cardiology Medical management recommended We will recommend he start Ranexa 500 mg twice daily for 1 week then up to 1000 mg twice daily Continue other outpatient medications Risk Assessment/Calculations:          Physical Exam:   VS:  BP (!) 144/88 (BP Location: Left Arm, Patient Position: Sitting, Cuff Size: Normal)   Pulse 63   Ht 5\' 9"  (1.753 m)   Wt 184 lb 3.2 oz (83.6 kg)   SpO2 98%   BMI 27.20 kg/m    Wt Readings from Last 3 Encounters:  01/28/23 184 lb 3.2 oz (83.6 kg)  12/15/22 188 lb (85.3 kg)  05/24/22 194 lb 4 oz (88.1 kg)    GEN: Well nourished, well developed in no acute distress NECK: No JVD; No carotid bruits CARDIAC: RRR, no murmurs, rubs, gallops RESPIRATORY:  Clear to auscultation without rales, wheezing or rhonchi  ABDOMEN: Soft, non-tender, non-distended EXTREMITIES:  No edema; No deformity   ASSESSMENT AND PLAN: .       Informed Consent   Shared Decision Making/Informed Consent{ All outpatient stress tests require an informed consent ([id number]) ATTESTATION ORDER       :[id number]} The risks [stroke (1 in 1000), death (1 in 1000), kidney failure [usually temporary] (1 in 500), bleeding (1 in 200), allergic reaction [possibly serious] (1 in 200)], benefits (diagnostic support and management of coronary artery disease) and alternatives of a cardiac catheterization were discussed in detail with Mr. Mcnichol and he is willing to proceed.     Dispo: Patient to return to clinic to see MD/APP 1 to 2 weeks postprocedure  Signed, Naquan Garman, NP  heart catheterization to be completed next week.  EKG today was completed and was sinus with ST depression in anterior leads and T wave inversions which is new changes noted from prior studies that he has had.  He is continued on aspirin 81 mg daily, atorvastatin 80 mg daily, ezetimibe 10 mg daily, and clopidogrel 75 mg daily.  Primary hypertension with blood pressure today 144/88.  Patient states that with his chest discomfort and worsening shortness of breath that he has had elevated blood pressures.  Home blood pressures he states has been running 130s and 140s.  He stated that his blood pressure also increased after he had seen his pulmonologist had a steroid shot and was started on antibiotics.  Today he is continued on furosemide to 20 mg 5 days a week, Toprol-XL 100 mg twice daily and hydralazine 25 mg twice daily unfortunately his amlodipine was recently discontinued and with his blood pressure is not running on wards of 160 systolic the patient and his wife opted not to start the hydroxylate.  He has been encouraged to start that medication today.  Hyperlipidemia which remains at goal.  Last LDL 51. he is continued on atorvastatin 80 mg daily and ezetimibe 10 mg daily.    Cryptogenic stroke with no new deficits.  No evidence of atrial fibrillation/flutter on ILR interrogation.'s he remains on DAPT as outlined above.  PSVT/atrial tachycardia is quiescent.  He remains on Toprol-XL 100 mg twice daily.  Chronic hypoxic respiratory failure with chronic dyspnea we  continues to follow with pulmonary.  Pulmonologist has recently given him steroids and antibiotics for some worsening symptoms of shortness of breath.  He is recommended to continue to follow with his pulmonologist for ongoing testing, management, and oxygen therapy recommendations.  CKD stage III where he is sent for BMP today as baseline serum creatinine is 1.5-1.6.  He is continued on furosemide 40 mg 5 days/week.  Skin infection to the groin.  Area of reddened and excoriated with a yeasty type feeling over.  Patient prescribed nystatin cream to be applied to the groin twice daily for 2 weeks.    Informed Consent   Shared Decision Making/Informed Consent The risks [stroke (1 in 1000), death (1 in 1000), kidney failure [usually temporary] (1 in 500), bleeding (1 in 200), allergic reaction [possibly serious] (1 in 200)], benefits (diagnostic support and management of coronary artery disease) and alternatives of a cardiac catheterization were discussed in detail with Mr. Parmentier and he is willing to proceed.     Dispo: Patient to return to clinic to see MD/APP 1 to 2 weeks postprocedure  Signed, Kaileigh Viswanathan, NP

## 2023-01-28 NOTE — Patient Instructions (Addendum)
Medication Instructions:  Start Nystatin cream to groin twice a day for 14 days.  *If you need a refill on your cardiac medications before your next appointment, please call your pharmacy*   Lab Work: Your provider would like for you to have following labs drawn today troponin, CBC and BMP.   If you have labs (blood work) drawn today and your tests are completely normal, you will receive your results only by: MyChart Message (if you have MyChart) OR A paper copy in the mail If you have any lab test that is abnormal or we need to change your treatment, we will call you to review the results.   Testing/Procedures:  [place] [facility] [place] [address] [place] [address] Dept: [phone] Loc: [phone]  William Mueller  01/28/2023  You are scheduled for a Cardiac Catheterization on Thursday, August 29 with Dr. Lorine Bears.  1. Please arrive at the [facility] Entrance of [facility], [address], [place] [address] at 12:00 PM (This is 1 hour(s) prior to your procedure time).  Proceed to the Check-In Desk directly inside the entrance.  Procedure Parking: Use the entrance off of the [address] side of the hospital. Turn right upon entering and follow the driveway to parking that is directly in front of the [facility] Vascular Center. There is no valet parking available at this entrance, however there is an awning directly in front of the Heart & Vascular Center for drop off/ pick up for patients.  Special note: Every effort is made to have your procedure done on time. Please understand that emergencies sometimes delay scheduled procedures.  2. Diet: Do not eat solid foods after midnight.  The patient may have clear liquids until 5am upon the day of the procedure.  4. Medication instructions in preparation for your procedure: Hold Lasix the morning of  procedure.   Contrast Allergy: No   On the morning of your procedure, take your Aspirin 81 mg and Plavix/Clopidogrel and any morning medicines NOT listed above.  You may use sips of water.  5. Plan to go home the same day, you will only stay overnight if medically necessary. 6. Bring a current list of your medications and current insurance cards. 7. You MUST have a responsible person to drive you home. 8. Someone MUST be with you the first 24 hours after you arrive home or your discharge will be delayed. 9. Please wear clothes that are easy to get on and off and wear slip-on shoes.  Thank you for allowing [place] to care for you!   -- [place]    Follow-Up: At [facility], you and your health needs are our priority.  As part of our continuing mission to provide you with exceptional heart care, we have created designated Provider Care Teams.  These Care Teams include your primary Cardiologist (physician) and Advanced Practice Providers (APPs -  Physician Assistants and Nurse Practitioners) who all work together to provide you with the care you need, when you need it.  We recommend signing up for the patient portal called "MyChart".  Sign up information is provided on this After Visit Summary.  MyChart is used to connect with patients for Virtual Visits (Telemedicine).  Patients are able to view lab/test results, encounter notes, upcoming appointments, etc.  Non-urgent messages can be sent to your provider as well.  To learn more about what you can do with MyChart, go to [email or web address].    Your next appointment:   3 -4 week(s)  Provider:   You may see Julien Nordmann, MD or one of the following Advanced Practice Providers on your designated Care Team:   Nicolasa Ducking, NP Eula Listen, PA-C Cadence Fransico Michael, PA-C Charlsie Quest, NP

## 2023-01-28 NOTE — Progress Notes (Unsigned)
Cardiology Office Note:  .   Date:  01/28/2023  ID:  Trilby Leaver, DOB 01-11-1951, MRN [id number] PCP: Ailene Ravel, MD  [facility] Providers Cardiologist:  Julien Nordmann, MD { Click to update primary MD,subspecialty MD or APP then REFRESH:1}   History of Present Illness: .   CARLYN MCCOLLUM is a 72 y.o. male with a past medical history of CAD status post PCI to the RCA in 12/2013 with chronic chest pain status post multiple repeat caths outlined below, PSVT, CKD Stage III, Cryptogenic stroke status post ILR monitored by EP, COPD secondary to long history of tobacco use with E-Cigarette use on home oxygen, HFpEF, Paroxysmal atrial tachycardia, labile hypertension with orthostatic hypotension, anemia, depression, anxiety, hypertension, hyperlipidemia, GERD, who presents Today As a Work in with complaints of chest pain.  He was admitted to [facility] on 12/2018 15 with chest pain.  LHC showed left main was normal, mid LAD diffuse 20%, ostial D1 50%, proximal D1 50%, D2 30%, mid left circumflex moderately calcified with 30%, RCA mildly calcified with diffuse 30% proximally.  Vessel was occluded in the midsegment.  Faint left-to-right collaterals were noted.  Successful PCI/DES to the RCA.  Echocardiogram revealed EF 55 to 60%, mild LVH, G1DD, elevated RA pressure.  He was admitted again 02/2014 with chest pain, ruled out, left AMA.  Admitted 02/2015 with chest pain.  Ruled out.  Underwent LHC that showed patent RCA stent with proximal RCA 55% stenosis, D1 80%, mid left circumflex to distal left circumflex 90%.  Admitted 09/2015 with chest pain, ruled out, left heart catheter patent RCA stent with unchanged nonobstructive disease when compared to prior LHC.  FFR of the RCA lesion nonsignificant at 0.93.  Medical management was advised.  Repeat echo in 06/2017 showed EF 65 to 70%, RWMA, G2DD.  Admitted in 09/2018 with recurrent chest pain.  Troponin mildly elevated peaking at 0.04.  Repeat LHC showed left main  without significant disease, D1 80%, proximal left circumflex 30%, mid to distal left circumflex 90%, proximal RCA 30% prior to the previously placed in the proximal to mid region without significant ISR.  LVEF 55%.  Again medical management was advised send he was started on Ranexa 500 mg twice daily.  On his follow-up visit posthospitalization he had stated that his catheterization had fixed him. He had to be reminded no intervention was performed he was started on antianginal therapy.  He had also stopped the Ranexa on his own stating his chest pain was improved at his follow-up. He also noted a jittery feeling with this medication possible difficulty in emptying his bladder.  Zio patch in 11/2018 showed normal sinus rhythm with average heart rate of 104 bpm 2 SVT runs occurred with the fastest interval lasting 6 beats at a maximal rate of 152 bpm and the longest interval lasting 8 beats with an average rate of 111 bpm.  Follow-up echo on 12/19/2018 showed EF 60 to 65%, diastolic dysfunction, no RWMA, normal RV SF.  He was admitted 12/21 with a cryptogenic stroke.  Echo demonstrated EF 55 to 60%, no RWMA, G1 DD, normal RV systolic function.  Zio patch showed predominant rhythm of sinus rhythm with no evidence of atrial fibs/flutter or significant pauses.  He subsequently underwent a loop implantation with device interrogations showing no evidence of atrial fibs/flutter.  He was admitted to the hospital from 8/25 through 01/30/2022 with complaints of chest pain.  High-sensitivity troponin was 19 with a delta and peak troponin  of 28.  He remained symptom-free in the hospital and was discharged outpatient follow-up.  He was seen for hospital follow-up on 02/02/2022 noted to 3-week history of worsening substernal chest pressure as well as some intermittent elevated BP readings.  LHC 02/05/2022 showed no significant change in coronary anatomy since cardiac catheterization in 2020 with patent RCA stent with no significant  restenosis.  Medical therapy was recommended along with titration of furosemide to 40 mg daily.  Echo on 02/15/2022 revealed EF 55 to 60%, normal RV systolic function, no significant valvular abnormalities.  On follow-up 02/19/2022 continue to complain of chronic dyspnea and fatigue and occasional episodes of chest discomfort.  No other medication changes were made no further testing was ordered at that time.  He was last seen in clinic 05/24/2022 by Dr. Mariah Milling, at that time complained of increased swelling since starting amlodipine.  He remained on Lasix and potassium for 5 days a week.  Send he had stopped smoking and stopped drinking.  Amlodipine was stopped and he was started on hydralazine 25 mg twice daily.  Patient returns to clinic today accompanied by his wife being seen as a work and after calling the triage line complaining of chest pain, chest tightness, jaw pain and radiating pain into his shoulder, upper arm, back.  Chest pain was relieved with nitroglycerin x 1 dose. Associated symptoms of shortness of breath, nausea, vomiting and diaphoresis.  He continued to have chest pain episodes with the last one being this morning.  He has found no exacerbating symptoms but is relieved with nitro.  Originally he thought his symptoms were related to GERD exam but just with the eating symptoms have progressed and he continues to have chest discomfort, chronic shortness of breath that he states is unchanged but continues to have nausea without vomiting and has been diaphoretic with each episode of chest discomfort.  He stated he also was started on antibiotics by his pulmonary provider but he is unsure as to what they were for and since starting those medications he has been feeling worse.  He has not been taking all of his blood pressure medications as prescribed.  Did have one episode where he presented to the emergency department with a systolic blood pressure of over 200 was treated with clonidine in the ER  and was discharged home with follow-up with cardiology.  ROS: 10 point review of systems has been completed and considered negative with exception what is been listed in HPI  Studies Reviewed: Marland Kitchen   EKG Interpretation Date/Time:  Friday January 28 2023 14:54:12 EDT Ventricular Rate:  67 PR Interval:  140 QRS Duration:  72 QT Interval:  428 QTC Calculation: 452 R Axis:   64  Text Interpretation: Normal sinus rhythm ST & T wave abnormality, consider inferior ischemia ST & T wave abnormality, consider anterolateral ischemia When compared with ECG of 15-Dec-2022 13:12, ST now depressed in Anterior leads T wave inversion now evident in Anterior leads Confirmed by Charlsie Quest ([id number]) on 01/28/2023 3:08:42 PM   TTE 02/15/22 1. Left ventricular ejection fraction, by estimation, is 55 to 60%. The  left ventricle has normal function. Left ventricular endocardial border  not optimally defined to evaluate regional wall motion. There is mild left  ventricular hypertrophy. Left  ventricular diastolic parameters were normal.   2. Right ventricular systolic function is normal. The right ventricular  size is normal. Tricuspid regurgitation signal is inadequate for assessing  PA pressure.   3. The mitral valve is normal  in structure. No evidence of mitral valve  regurgitation. No evidence of mitral stenosis.   4. The aortic valve is normal in structure. Aortic valve regurgitation is  not visualized. No aortic stenosis is present.   5. The inferior vena cava is normal in size with greater than 50%  respiratory variability, suggesting right atrial pressure of 3 mmHg.   LHC 02/05/22   Prox LAD to Mid LAD lesion is 40% stenosed.   1st Diag lesion is 80% stenosed.   LPAV lesion is 95% stenosed.   Prox RCA lesion is 50% stenosed.   Previously placed Prox RCA to Mid RCA stent of unknown type is  widely patent.   1.  No significant change in coronary anatomy since most recent cardiac catheterization in 2020.   Patent RCA stent with no significant restenosis.  Stable moderate proximal RCA stenosis and subocclusive disease in a small AV groove left circumflex artery and first diagonal. 2.  Left ventricular angiography was not performed due to chronic kidney disease. 3.  Severely elevated left ventricular end-diastolic pressure 37 mmHg.   Recommendations: Recommend continuing medical therapy for coronary artery disease. I increased the dose of furosemide to 40 mg once daily.  Recommend an outpatient echocardiogram given her recent EF evaluation. Only 18 mL of contrast was used for the procedure.  Zio patch 05/2020: Normal sinus rhythm No patient triggered events min HR of 45 bpm, max HR of 138 bpm, and avg HR of 66 bpm.   8 Supraventricular Tachycardia/atrial tachycardia runs occurred, the run with the fastest interval lasting 5 beats with a max rate of 138 bpm, the longest lasting 13 beats with an avg rate of 110 bpm.    Possible Junctional Rhythm was present.  Isolated SVEs were rare (<1.0%), SVE Couplets were rare (<1.0%), and SVE Triplets were rare (<1.0%). Isolated VEs were rare (<1.0%, 41), VE Couplets were rare (<1.0%, 3), and VE Triplets were rare (<1.0%)   TTE 05/08/2020: 1. Left ventricular ejection fraction, by estimation, is 55 to 60%. The  left ventricle has normal function. The left ventricle has no regional  wall motion abnormalities. Left ventricular diastolic parameters are  consistent with Grade I diastolic  dysfunction (impaired relaxation).   2. Right ventricular systolic function is normal. The right ventricular  size is normal.    TTE 12/19/2018: 1. The left ventricle has normal systolic function with an ejection fraction of 60-65%. The cavity size was normal. There is mildly increased left ventricular wall thickness. Left ventricular diastolic Doppler parameters are consistent with impaired  relaxation. No evidence of left ventricular regional wall motion abnormalities.   2. The right ventricle has normal systolic function. The cavity was normal. There is no increase in right ventricular wall thickness. Right ventricular systolic pressure could not be assessed.  3. The aortic valve was not well visualized   Zio monitor 11/2018: Event Monitor   Normal sinus rhythm Avg HR of 104 bpm.   2 Supraventricular Tachycardia runs occurred, the run with the fastest interval lasting 6 beats with a max rate of 152 bpm,  the longest lasting 8 beats with an avg rate of 111 bpm.    Isolated SVEs were rare (<1.0%), SVE Couplets were rare (<1.0%), and SVE Triplets were rare (<1.0%). Isolated VEs were rare (<1.0%), VE Couplets were rare (<1.0%), and no VE Triplets were present. Ventricular Bigeminy was present.   Patient triggered events were not associated with significant arrhythmia   LHC 09/2018: Coronary angiography:  Coronary dominance:  Right or codominant  Left mainstem:   Large vessel that bifurcates into the LAD and left circumflex, no significant disease noted  Left anterior descending (LAD):   Large vessel that extends to the apical region, diagonal branch 2, first diagonal branch is a small to moderate size vessel, severe diffuse disease predominantly in the proximal region estimated at 80%, second diagonal branch moderate to large size vessel no significant disease  Left circumflex (LCx):  Large vessel with OM branch 2, 30% mid left circumflex disease, distal left circumflex after takeoff of OM 2 is severely diseased, small vessel.  There is a large OM branch with no significant disease  Right coronary artery (RCA):  Right dominant vessel with PL and PDA, no significant disease noted.  30% proximal RCA disease prior to long stent in the proximal to mid region, stent with no significant in-stent restenosis  Left ventriculography: Left ventricular systolic function is normal, LVEF is estimated at 55%, there is no significant mitral regurgitation , no significant  aortic valve stenosis  Final Conclusions:   Significant disease of a small diagonal branch and small distal left circumflex branches Mean branches of the RCA, left circumflex, LAD and large diagonal are intact with no significant disease  Recommendations:  Etiology of his chest pain could be secondary to small vessel disease Pictures reviewed by interventional cardiology Medical management recommended We will recommend he start Ranexa 500 mg twice daily for 1 week then up to 1000 mg twice daily Continue other outpatient medications Risk Assessment/Calculations:          Physical Exam:   VS:  BP (!) 144/88 (BP Location: Left Arm, Patient Position: Sitting, Cuff Size: Normal)   Pulse 63   Ht 5\' 9"  (1.753 m)   Wt 184 lb 3.2 oz (83.6 kg)   SpO2 98%   BMI 27.20 kg/m    Wt Readings from Last 3 Encounters:  01/28/23 184 lb 3.2 oz (83.6 kg)  12/15/22 188 lb (85.3 kg)  05/24/22 194 lb 4 oz (88.1 kg)    GEN: Well nourished, well developed in no acute distress NECK: No JVD; No carotid bruits CARDIAC: RRR, no murmurs, rubs, gallops RESPIRATORY:  Clear to auscultation without rales, wheezing or rhonchi  ABDOMEN: Soft, non-tender, non-distended EXTREMITIES:  No edema; No deformity   ASSESSMENT AND PLAN: .       Informed Consent   Shared Decision Making/Informed Consent{ All outpatient stress tests require an informed consent ([id number]) ATTESTATION ORDER       :[id number]} The risks [stroke (1 in 1000), death (1 in 1000), kidney failure [usually temporary] (1 in 500), bleeding (1 in 200), allergic reaction [possibly serious] (1 in 200)], benefits (diagnostic support and management of coronary artery disease) and alternatives of a cardiac catheterization were discussed in detail with Mr. Mcnichol and he is willing to proceed.     Dispo: Patient to return to clinic to see MD/APP 1 to 2 weeks postprocedure  Signed, Naquan Garman, NP

## 2023-01-29 LAB — COMPREHENSIVE METABOLIC PANEL
ALT: 15 IU/L (ref 0–44)
AST: 10 IU/L (ref 0–40)
Albumin: 4.5 g/dL (ref 3.8–4.8)
Alkaline Phosphatase: 134 IU/L — ABNORMAL HIGH (ref 44–121)
BUN/Creatinine Ratio: 10 (ref 10–24)
BUN: 17 mg/dL (ref 8–27)
Bilirubin Total: 0.5 mg/dL (ref 0.0–1.2)
CO2: 29 mmol/L (ref 20–29)
Calcium: 9.5 mg/dL (ref 8.6–10.2)
Chloride: 100 mmol/L (ref 96–106)
Creatinine, Ser: 1.73 mg/dL — ABNORMAL HIGH (ref 0.76–1.27)
Globulin, Total: 2.2 g/dL (ref 1.5–4.5)
Glucose: 101 mg/dL — ABNORMAL HIGH (ref 70–99)
Potassium: 4.9 mmol/L (ref 3.5–5.2)
Sodium: 142 mmol/L (ref 134–144)
Total Protein: 6.7 g/dL (ref 6.0–8.5)
eGFR: 41 mL/min/{1.73_m2} — ABNORMAL LOW (ref >59)

## 2023-01-29 LAB — TROPONIN T: Troponin T (Highly Sensitive): 42 ng/L (ref 0–22)

## 2023-01-30 ENCOUNTER — Encounter: Payer: Self-pay | Admitting: *Deleted

## 2023-01-30 ENCOUNTER — Emergency Department: Payer: PPO

## 2023-01-30 ENCOUNTER — Other Ambulatory Visit: Payer: Self-pay

## 2023-01-30 ENCOUNTER — Emergency Department
Admission: EM | Admit: 2023-01-30 | Discharge: 2023-01-31 | Disposition: A | Discharge status: Home / Self Care | Payer: PPO | Attending: Emergency Medicine | Admitting: Emergency Medicine

## 2023-01-30 ENCOUNTER — Telehealth: Payer: Self-pay | Admitting: Cardiology

## 2023-01-30 DIAGNOSIS — J449 Chronic obstructive pulmonary disease, unspecified: Secondary | ICD-10-CM | POA: Diagnosis not present

## 2023-01-30 DIAGNOSIS — I25110 Atherosclerotic heart disease of native coronary artery with unstable angina pectoris: Secondary | ICD-10-CM | POA: Insufficient documentation

## 2023-01-30 DIAGNOSIS — R079 Chest pain, unspecified: Secondary | ICD-10-CM | POA: Diagnosis present

## 2023-01-30 DIAGNOSIS — I200 Unstable angina: Secondary | ICD-10-CM

## 2023-01-30 LAB — BASIC METABOLIC PANEL
Anion gap: 10 (ref 5–15)
BUN: 22 mg/dL (ref 8–23)
CO2: 29 mmol/L (ref 22–32)
Calcium: 9.1 mg/dL (ref 8.9–10.3)
Chloride: 99 mmol/L (ref 98–111)
Creatinine, Ser: 1.85 mg/dL — ABNORMAL HIGH (ref 0.61–1.24)
GFR, Estimated: 38 mL/min — ABNORMAL LOW (ref >60)
Glucose, Bld: 104 mg/dL — ABNORMAL HIGH (ref 70–99)
Potassium: 4.2 mmol/L (ref 3.5–5.1)
Sodium: 138 mmol/L (ref 135–145)

## 2023-01-30 LAB — CBC
HCT: 43.8 % (ref 39.0–52.0)
Hemoglobin: 13.6 g/dL (ref 13.0–17.0)
MCH: 26.9 pg (ref 26.0–34.0)
MCHC: 31.1 g/dL (ref 30.0–36.0)
MCV: 86.7 fL (ref 80.0–100.0)
Platelets: 287 10*3/uL (ref 150–400)
RBC: 5.05 MIL/uL (ref 4.22–5.81)
RDW: 12.9 % (ref 11.5–15.5)
WBC: 10.6 10*3/uL — ABNORMAL HIGH (ref 4.0–10.5)
nRBC: 0 % (ref 0.0–0.2)

## 2023-01-30 LAB — TROPONIN I (HIGH SENSITIVITY): Troponin I (High Sensitivity): 14 ng/L (ref <18)

## 2023-01-30 NOTE — Progress Notes (Signed)
Patient wife called with results.

## 2023-01-30 NOTE — Telephone Encounter (Signed)
Called and spoke with the patient's wife William Mueller about blood work that he had completed Friday afternoon in clinic.  As he was work-in for chest pain prior to having his heart catheterization dates set he was sent for a high-sensitivity troponin which resulted back at a level of 47.  Previously in July when he had been seen in the emergency department high-sensitivity troponin resulted as normal.  William Mueller were advised that if he continued to have any further discomfort that he should present to the closest emergency department for further evaluation and treatment.  She agreed but stated he had not had any further complaints of chest pain since being seen on Friday and was appreciative of the return call.

## 2023-01-30 NOTE — ED Provider Notes (Incomplete)
[facility] Provider Note    Event Date/Time   First MD Initiated Contact with Patient 01/30/23 2302     (approximate)   History   Chest Pain   HPI  William Mueller is a 72 y.o. male   Past medical history of ***    Independent Historian contributed to assessment above: ***  External Medical Documents Reviewed: ***      Physical Exam   Triage Vital Signs: ED Triage Vitals  Encounter Vitals Group     BP 01/30/23 2214 (!) 183/99     Systolic BP Percentile --      Diastolic BP Percentile --      Pulse Rate 01/30/23 2214 88     Resp 01/30/23 2217 (!) 24     Temp 01/30/23 2214 98.4 F (36.9 C)     Temp Source 01/30/23 2214 Oral     SpO2 01/30/23 2214 99 %     Weight 01/30/23 2212 184 lb 1.4 oz (83.5 kg)     Height 01/30/23 2212 5\' 9"  (1.753 m)     Head Circumference --      Peak Flow --      Pain Score 01/30/23 2212 0     Pain Loc --      Pain Education --      Exclude from Growth Chart --     Most recent vital signs: Vitals:   01/30/23 2217 01/30/23 2300  BP:  (!) 162/75  Pulse:  72  Resp: (!) 24 15  Temp:    SpO2:  100%    General: Awake, no distress. *** CV:  Good peripheral perfusion. *** Resp:  Normal effort. *** Abd:  No distention. *** Other:  ***   ED Results / Procedures / Treatments   Labs (all labs ordered are listed, but only abnormal results are displayed) Labs Reviewed  BASIC METABOLIC PANEL - Abnormal; Notable for the following components:      Result Value   Glucose, Bld 104 (*)    Creatinine, Ser 1.85 (*)    GFR, Estimated 38 (*)    All other components within normal limits  CBC - Abnormal; Notable for the following components:   WBC 10.6 (*)    All other components within normal limits  TROPONIN I (HIGH SENSITIVITY)     I ordered and reviewed the above labs they are notable for ***  EKG  ED ECG REPORT I, Pilar Jarvis, the attending physician, personally viewed and interpreted this ECG.    Date: 01/30/2023  EKG Time: ***  Rate: ***  Rhythm: {ekg findings:315101}  Axis: ***  Intervals:{conduction defects:17367}  ST&T Change: ***    RADIOLOGY I independently reviewed and interpreted *** I also reviewed radiologist's formal read.   PROCEDURES:  Critical Care performed: {CriticalCareYesNo:19197::"Yes, see critical care procedure note(s)","No"}  Procedures   MEDICATIONS ORDERED IN ED: Medications - No data to display  External physician / consultants:  I spoke with *** regarding care plan for this patient.   IMPRESSION / MDM / ASSESSMENT AND PLAN / ED COURSE  I reviewed the triage vital signs and the nursing notes.                                Patient's presentation is most consistent with {EM COPA:27473}  Differential diagnosis includes, but is not limited to, ***   ***The patient is on the cardiac monitor to  evaluate for evidence of arrhythmia and/or significant heart rate changes.  MDM:  ***  I considered hospitalization for admission or observation ***        FINAL CLINICAL IMPRESSION(S) / ED DIAGNOSES   Final diagnoses:  None     Rx / DC Orders   ED Discharge Orders     None        Note:  This document was prepared using Dragon voice recognition software and may include unintentional dictation errors.

## 2023-01-30 NOTE — ED Provider Notes (Signed)
[facility] Provider Note    Event Date/Time   First MD Initiated Contact with Patient 01/30/23 2302     (approximate)   History   Chest Pain   HPI  William Mueller is a 72 y.o. male   Past medical history of COPD on [place] oxygen, CAD with prior PCI for MI, here with 2 weeks of intermittent chest pain.  Happens at rest, subsides within minutes.  Has met with cardiologist earlier this week with a plan for cardiac catheterization later this week.  Patient labs drawn and troponin mildly elevated in the 40s, got a call for this, asked to come to the emergency department with any recurrence of chest pain.  He had a recurrence of chest pain this evening while at rest, consistent with his intermittent chest pain over the last 2 weeks.  Completely subsided with 1 nitroglycerin.  Chest pain-free now.  Denies any new respiratory symptoms, but endorses chronic shortness of breath from his baseline COPD.  He has no other acute medical complaints.  Independent Historian contributed to assessment above: His wife is at bedside corroborates past medical history and history of present illness as above  External Medical Documents Reviewed: Cardiology note from earlier this week documenting his past medical history cardiac history and plans for catheterization later this week      Physical Exam   Triage Vital Signs: ED Triage Vitals  Encounter Vitals Group     BP 01/30/23 2214 (!) 183/99     Systolic BP Percentile --      Diastolic BP Percentile --      Pulse Rate 01/30/23 2214 88     Resp 01/30/23 2217 (!) 24     Temp 01/30/23 2214 98.4 F (36.9 C)     Temp Source 01/30/23 2214 Oral     SpO2 01/30/23 2214 99 %     Weight 01/30/23 2212 184 lb 1.4 oz (83.5 kg)     Height 01/30/23 2212 5\' 9"  (1.753 m)     Head Circumference --      Peak Flow --      Pain Score 01/30/23 2212 0     Pain Loc --      Pain Education --      Exclude from Growth Chart --     Most recent  vital signs: Vitals:   01/30/23 2217 01/30/23 2300  BP:  (!) 162/75  Pulse:  72  Resp: (!) 24 15  Temp:    SpO2:  100%    General: Awake, no distress.  CV:  Good peripheral perfusion.  Resp:  Normal effort.  Abd:  No distention.  Other:  Awake alert comfortable appearing hypertensive otherwise vital signs normal.  Clear lungs, skin appears warm well-perfused, euvolemic, radial pulses intact bilaterally.   ED Results / Procedures / Treatments   Labs (all labs ordered are listed, but only abnormal results are displayed) Labs Reviewed  BASIC METABOLIC PANEL - Abnormal; Notable for the following components:      Result Value   Glucose, Bld 104 (*)    Creatinine, Ser 1.85 (*)    GFR, Estimated 38 (*)    All other components within normal limits  CBC - Abnormal; Notable for the following components:   WBC 10.6 (*)    All other components within normal limits  TROPONIN I (HIGH SENSITIVITY)  TROPONIN I (HIGH SENSITIVITY)     I ordered and reviewed the above labs they are notable for  creatinine is 1.8, consistent with prior testing earlier this week, troponin is 14  EKG  ED ECG REPORT I, Pilar Jarvis, the attending physician, personally viewed and interpreted this ECG.   Date: 01/30/2023  EKG Time: 2215  Rate: 91  Rhythm: nsr  Axis: nl  Intervals:none  ST&T Change: no stemi    RADIOLOGY I independently reviewed and interpreted chest x-ray and see no focality or pneumothorax I also reviewed radiologist's formal read.   PROCEDURES:  Critical Care performed: No  Procedures   MEDICATIONS ORDERED IN ED: Medications  aspirin chewable tablet 324 mg (has no administration in time range)    IMPRESSION / MDM / ASSESSMENT AND PLAN / ED COURSE  I reviewed the triage vital signs and the nursing notes.                                Patient's presentation is most consistent with acute presentation with potential threat to life or bodily function.  Differential  diagnosis includes, but is not limited to, unstable angina, CAD, COPD exacerbation, CHF exacerbation, dissection or PE   The patient is on the cardiac monitor to evaluate for evidence of arrhythmia and/or significant heart rate changes.  MDM: System with unstable angina, fortunately troponin is down to 14 from a high of 40 on previous testing.  No current chest pain.  Will check repeat troponin.  Considered PE, dissection, respiratory infection, COPD exacerbation but these are less likely given his intermittent chest pain now resolved.  High risk, has scheduled catheterization patient reports on Thursday of this week.  I considered hospitalization for admission or observation given unstable angina regardless of troponin testing today, but patient declined stating that he would rather follow-up as an outpatient has very close follow-up as already established.        FINAL CLINICAL IMPRESSION(S) / ED DIAGNOSES   Final diagnoses:  Unstable angina (HCC)     Rx / DC Orders   ED Discharge Orders     None        Note:  This document was prepared using Dragon voice recognition software and may include unintentional dictation errors.    Pilar Jarvis, MD 01/31/23 [phone]

## 2023-01-30 NOTE — ED Triage Notes (Signed)
CP intermittent for about 2 weeks, central/left sided "pressure" chest pain, radiated to the neck tonight. Scheduled catheterization this week. More SOB than normal. Chronic 2 liters [place]

## 2023-01-31 LAB — TROPONIN I (HIGH SENSITIVITY): Troponin I (High Sensitivity): 13 ng/L (ref <18)

## 2023-01-31 MED ORDER — ASPIRIN 81 MG PO CHEW
324.0000 mg | CHEWABLE_TABLET | Freq: Once | ORAL | Status: AC
  Administered 2023-01-31: 324 mg via ORAL
  Filled 2023-01-31: qty 4

## 2023-01-31 NOTE — Discharge Instructions (Signed)
It is very important that you follow-up with your cardiologist as planned for catheterization later this week.  If you have any recurrence of chest pain, new, worsening, or unexpected symptoms at all, call your cardiologist or come back to the emergency department.

## 2023-02-01 ENCOUNTER — Emergency Department
Admission: EM | Admit: 2023-02-01 | Discharge: 2023-02-02 | Disposition: A | Discharge status: Home / Self Care | Payer: PPO | Attending: Emergency Medicine | Admitting: Emergency Medicine

## 2023-02-01 ENCOUNTER — Encounter: Payer: Self-pay | Admitting: Emergency Medicine

## 2023-02-01 ENCOUNTER — Other Ambulatory Visit: Payer: Self-pay

## 2023-02-01 ENCOUNTER — Emergency Department: Payer: PPO

## 2023-02-01 DIAGNOSIS — I25119 Atherosclerotic heart disease of native coronary artery with unspecified angina pectoris: Secondary | ICD-10-CM | POA: Diagnosis not present

## 2023-02-01 DIAGNOSIS — R079 Chest pain, unspecified: Secondary | ICD-10-CM | POA: Diagnosis present

## 2023-02-01 DIAGNOSIS — I2089 Other forms of angina pectoris: Secondary | ICD-10-CM

## 2023-02-01 LAB — HEPATIC FUNCTION PANEL
ALT: 14 U/L (ref 0–44)
AST: 13 U/L — ABNORMAL LOW (ref 15–41)
Albumin: 3.6 g/dL (ref 3.5–5.0)
Alkaline Phosphatase: 92 U/L (ref 38–126)
Bilirubin, Direct: <0.1 mg/dL (ref 0.0–0.2)
Total Bilirubin: 0.7 mg/dL (ref 0.3–1.2)
Total Protein: 6.6 g/dL (ref 6.5–8.1)

## 2023-02-01 LAB — BASIC METABOLIC PANEL
Anion gap: 9 (ref 5–15)
BUN: 18 mg/dL (ref 8–23)
CO2: 29 mmol/L (ref 22–32)
Calcium: 8.8 mg/dL — ABNORMAL LOW (ref 8.9–10.3)
Chloride: 100 mmol/L (ref 98–111)
Creatinine, Ser: 1.69 mg/dL — ABNORMAL HIGH (ref 0.61–1.24)
GFR, Estimated: 42 mL/min — ABNORMAL LOW (ref >60)
Glucose, Bld: 132 mg/dL — ABNORMAL HIGH (ref 70–99)
Potassium: 3.5 mmol/L (ref 3.5–5.1)
Sodium: 138 mmol/L (ref 135–145)

## 2023-02-01 LAB — LIPASE, BLOOD: Lipase: 50 U/L (ref 11–51)

## 2023-02-01 LAB — CBC
HCT: 39.9 % (ref 39.0–52.0)
Hemoglobin: 12.4 g/dL — ABNORMAL LOW (ref 13.0–17.0)
MCH: 26.8 pg (ref 26.0–34.0)
MCHC: 31.1 g/dL (ref 30.0–36.0)
MCV: 86.2 fL (ref 80.0–100.0)
Platelets: 261 10*3/uL (ref 150–400)
RBC: 4.63 MIL/uL (ref 4.22–5.81)
RDW: 12.8 % (ref 11.5–15.5)
WBC: 10.6 10*3/uL — ABNORMAL HIGH (ref 4.0–10.5)
nRBC: 0 % (ref 0.0–0.2)

## 2023-02-01 LAB — TROPONIN I (HIGH SENSITIVITY)
Troponin I (High Sensitivity): 11 ng/L (ref <18)
Troponin I (High Sensitivity): 15 ng/L (ref <18)

## 2023-02-01 NOTE — ED Provider Notes (Signed)
[facility] Provider Note    Event Date/Time   First MD Initiated Contact with Patient 02/01/23 2302     (approximate)   History   Chest Pain   HPI William Mueller is a 72 y.o. male  with known severe CAD (Dr. Mariah Milling is cardiologist) and lung disease on 2 L of oxygen by nasal cannula at baseline.  He presents tonight for acute onset chest pain while at rest.  He said this happens to him sometimes and he was seen 2 days ago in the emergency department for the same, but tonight felt worse.  The chest pain has essentially gone away at this time.  He said it often feels worse after he eats.  No nausea or vomiting.  No shortness of breath.  No recent fever.  Of note, he is scheduled for a cardiac catheterization in 2 days (technically tomorrow now that it is after midnight).  He took a nitroglycerin at home which helped a little bit.  He knows he has multivessel disease.  He said his blood pressure has been elevated but it has come down since coming to the emergency department.  He states he is compliant with his medications.     Physical Exam   Triage Vital Signs: ED Triage Vitals  Encounter Vitals Group     BP 02/01/23 2048 (!) 191/76     Systolic BP Percentile --      Diastolic BP Percentile --      Pulse Rate 02/01/23 2048 77     Resp 02/01/23 2048 18     Temp 02/01/23 2048 98.1 F (36.7 C)     Temp Source 02/01/23 2048 Oral     SpO2 02/01/23 2048 100 %     Weight 02/01/23 2215 83.5 kg (184 lb 1.4 oz)     Height 02/01/23 2215 1.753 m (5\' 9" )     Head Circumference --      Peak Flow --      Pain Score 02/01/23 2049 4     Pain Loc --      Pain Education --      Exclude from Growth Chart --     Most recent vital signs: Vitals:   02/01/23 2230 02/01/23 2330  BP: (!) 186/84 (!) 177/78  Pulse: (!) 58 (!) 58  Resp: 15 14  Temp:    SpO2: 100% 100%    General: Awake, no distress.  CV:  Good peripheral perfusion.  Normal heart sounds.  Regular rate  and rhythm. Resp:  Normal effort. Speaking easily and comfortably, no accessory muscle usage nor intercostal retractions.  Lungs are clear to auscultation despite the patient's need for 2 L of supplementary oxygen by nasal cannula at baseline. Abd:  No distention.  No tenderness to palpation.  This includes palpation of the epigastrium and right upper quadrant.  Negative Murphy sign.   ED Results / Procedures / Treatments   Labs (all labs ordered are listed, but only abnormal results are displayed) Labs Reviewed  BASIC METABOLIC PANEL - Abnormal; Notable for the following components:      Result Value   Glucose, Bld 132 (*)    Creatinine, Ser 1.69 (*)    Calcium 8.8 (*)    GFR, Estimated 42 (*)    All other components within normal limits  CBC - Abnormal; Notable for the following components:   WBC 10.6 (*)    Hemoglobin 12.4 (*)    All other components within  normal limits  HEPATIC FUNCTION PANEL - Abnormal; Notable for the following components:   AST 13 (*)    All other components within normal limits  LIPASE, BLOOD  TROPONIN I (HIGH SENSITIVITY)  TROPONIN I (HIGH SENSITIVITY)     EKG  ED ECG REPORT I, Loleta Rose, the attending physician, personally viewed and interpreted this ECG.  Date: 02/01/2023 EKG Time: 20: 52 Rate: 76 Rhythm: normal sinus rhythm QRS Axis: normal Intervals: normal ST/T Wave abnormalities: There is some ST depression evident in the lateral leads with T wave inversion. Narrative Interpretation: Suggestive of lateral ischemia, but similar to morphology in prior EKGs    RADIOLOGY I viewed and per the patient's two-view chest x-ray.  No evidence of lobar pneumonia or pneumothorax.  The radiologist report mentions chronic emphysematous changes with no acute findings.   PROCEDURES:  Critical Care performed: No  .1-3 Lead EKG Interpretation  Performed by: Loleta Rose, MD Authorized by: Loleta Rose, MD     Interpretation: normal     ECG  rate:  60   ECG rate assessment: normal     Rhythm: sinus rhythm     Ectopy: none     Conduction: normal       IMPRESSION / MDM / ASSESSMENT AND PLAN / ED COURSE  I reviewed the triage vital signs and the nursing notes.                              Differential diagnosis includes, but is not limited to, angina, ACS, pneumonia, PE, pneumothorax.  Patient's presentation is most consistent with acute presentation with potential threat to life or bodily function.  Labs/studies ordered: EKG, lipase, hepatic function panel, high-sensitivity troponin x 2, basic metabolic panel, CBC, two-view chest x-ray  Interventions/Medications given:  Medications - No data to display  (Note:  hospital course my include additional interventions and/or labs/studies not listed above.)  The patient's vital signs are generally reassuring, somewhat hypertensive but came down substantially while in the ED.  Currently pain-free.  Reassuring physical exam, after physical exam and normal hepatic function panel and lipase, I have no concern for biliary colic or other gallbladder disease.  Strongly suspect patient is having ongoing episodic angina, particularly given his known coronary artery disease.  His EKG shows some questionable ischemic changes in the lateral leads, but these seem to be baseline based on multiple prior EKGs that I reviewed.  His 2 high-sensitivity troponins are negative and given that he is pain-free, he should be safe and appropriate for discharge and follow-up for his cardiac catheterization tomorrow as scheduled.  I considered hospitalization but given no evidence that he is having ACS at this time I think the outpatient follow-up is very appropriate.  The rest of his labs are also generally reassuring with stable chronic kidney disease and no other acute electrolyte abnormalities.  The patient was on the cardiac monitor to evaluate for evidence of arrhythmia and/or significant heart rate  changes.  Patient and wife are comfortable with this plan for follow-up.  I gave my usual and customary return precautions.      FINAL CLINICAL IMPRESSION(S) / ED DIAGNOSES   Final diagnoses:  Angina at rest     Rx / DC Orders   ED Discharge Orders     None        Note:  This document was prepared using Dragon voice recognition software and may include unintentional dictation errors.  Loleta Rose, MD 02/02/23 0010

## 2023-02-01 NOTE — ED Notes (Signed)
Rainbow sent to the lab at this time.  

## 2023-02-01 NOTE — ED Triage Notes (Signed)
Pt presents ambulatory to triage via POV with complaints of mid-sternal CP that started 2 hours ago. Pt notes taking 1 nitroglycerin PTA with some improvement in his sx. Pt describes the pain as "tightness" in the middle of his chest. Wears 2L Chronically. A&Ox4 at this time. Denies SOB.

## 2023-02-02 ENCOUNTER — Ambulatory Visit (INDEPENDENT_AMBULATORY_CARE_PROVIDER_SITE_OTHER): Payer: PPO

## 2023-02-02 DIAGNOSIS — I639 Cerebral infarction, unspecified: Secondary | ICD-10-CM | POA: Diagnosis not present

## 2023-02-02 NOTE — Discharge Instructions (Signed)
Your evaluation was reassuring tonight and it does not appear that you are having a heart attack.  You should be safe for follow-up on Thursday as planned for your cardiac catheterization.  Continue taking your regular medications.  Return to the emergency department if you develop new or worsening symptoms that concern you.

## 2023-02-03 ENCOUNTER — Ambulatory Visit
Admission: RE | Admit: 2023-02-03 | Discharge: 2023-02-03 | Disposition: A | Discharge status: Home / Self Care | Payer: PPO | Attending: Cardiovascular Disease | Admitting: Cardiovascular Disease

## 2023-02-03 ENCOUNTER — Other Ambulatory Visit: Payer: Self-pay

## 2023-02-03 ENCOUNTER — Encounter
Admission: RE | Disposition: A | Discharge status: Home / Self Care | Payer: Self-pay | Source: Home / Self Care | Attending: Cardiovascular Disease

## 2023-02-03 ENCOUNTER — Encounter: Payer: Self-pay | Admitting: Cardiovascular Disease

## 2023-02-03 DIAGNOSIS — L089 Local infection of the skin and subcutaneous tissue, unspecified: Secondary | ICD-10-CM | POA: Insufficient documentation

## 2023-02-03 DIAGNOSIS — I5032 Chronic diastolic (congestive) heart failure: Secondary | ICD-10-CM | POA: Insufficient documentation

## 2023-02-03 DIAGNOSIS — I200 Unstable angina: Secondary | ICD-10-CM

## 2023-02-03 DIAGNOSIS — Z955 Presence of coronary angioplasty implant and graft: Secondary | ICD-10-CM | POA: Insufficient documentation

## 2023-02-03 DIAGNOSIS — Z87891 Personal history of nicotine dependence: Secondary | ICD-10-CM | POA: Insufficient documentation

## 2023-02-03 DIAGNOSIS — N183 Chronic kidney disease, stage 3 unspecified: Secondary | ICD-10-CM | POA: Diagnosis not present

## 2023-02-03 DIAGNOSIS — I130 Hypertensive heart and chronic kidney disease with heart failure and stage 1 through stage 4 chronic kidney disease, or unspecified chronic kidney disease: Secondary | ICD-10-CM | POA: Diagnosis not present

## 2023-02-03 DIAGNOSIS — J449 Chronic obstructive pulmonary disease, unspecified: Secondary | ICD-10-CM | POA: Insufficient documentation

## 2023-02-03 DIAGNOSIS — J9611 Chronic respiratory failure with hypoxia: Secondary | ICD-10-CM | POA: Insufficient documentation

## 2023-02-03 DIAGNOSIS — I4719 Other supraventricular tachycardia: Secondary | ICD-10-CM | POA: Insufficient documentation

## 2023-02-03 DIAGNOSIS — E785 Hyperlipidemia, unspecified: Secondary | ICD-10-CM | POA: Insufficient documentation

## 2023-02-03 DIAGNOSIS — Z7902 Long term (current) use of antithrombotics/antiplatelets: Secondary | ICD-10-CM | POA: Diagnosis not present

## 2023-02-03 DIAGNOSIS — Z8673 Personal history of transient ischemic attack (TIA), and cerebral infarction without residual deficits: Secondary | ICD-10-CM | POA: Diagnosis not present

## 2023-02-03 DIAGNOSIS — I25118 Atherosclerotic heart disease of native coronary artery with other forms of angina pectoris: Secondary | ICD-10-CM | POA: Diagnosis not present

## 2023-02-03 DIAGNOSIS — Z7982 Long term (current) use of aspirin: Secondary | ICD-10-CM | POA: Diagnosis not present

## 2023-02-03 DIAGNOSIS — Z79899 Other long term (current) drug therapy: Secondary | ICD-10-CM | POA: Insufficient documentation

## 2023-02-03 HISTORY — PX: RIGHT/LEFT HEART CATH AND CORONARY ANGIOGRAPHY: CATH118266

## 2023-02-03 LAB — POCT I-STAT 7, (LYTES, BLD GAS, ICA,H+H)
Acid-Base Excess: 2 mmol/L (ref 0.0–2.0)
Bicarbonate: 28.2 mmol/L — ABNORMAL HIGH (ref 20.0–28.0)
Calcium, Ion: 1.18 mmol/L (ref 1.15–1.40)
HCT: 35 % — ABNORMAL LOW (ref 39.0–52.0)
Hemoglobin: 11.9 g/dL — ABNORMAL LOW (ref 13.0–17.0)
O2 Saturation: 99 %
Potassium: 4.2 mmol/L (ref 3.5–5.1)
Sodium: 139 mmol/L (ref 135–145)
TCO2: 30 mmol/L (ref 22–32)
pCO2 arterial: 48.2 mmHg — ABNORMAL HIGH (ref 32–48)
pH, Arterial: 7.375 (ref 7.35–7.45)
pO2, Arterial: 124 mmHg — ABNORMAL HIGH (ref 83–108)

## 2023-02-03 LAB — POCT I-STAT EG7
Acid-base deficit: 1 mmol/L (ref 0.0–2.0)
Bicarbonate: 27.3 mmol/L (ref 20.0–28.0)
Calcium, Ion: 1.23 mmol/L (ref 1.15–1.40)
HCT: 37 % — ABNORMAL LOW (ref 39.0–52.0)
Hemoglobin: 12.6 g/dL — ABNORMAL LOW (ref 13.0–17.0)
O2 Saturation: 60 %
Potassium: 4.3 mmol/L (ref 3.5–5.1)
Sodium: 139 mmol/L (ref 135–145)
TCO2: 29 mmol/L (ref 22–32)
pCO2, Ven: 59.1 mmHg (ref 44–60)
pH, Ven: 7.272 (ref 7.25–7.43)
pO2, Ven: 36 mmHg (ref 32–45)

## 2023-02-03 LAB — CUP PACEART REMOTE DEVICE CHECK
Date Time Interrogation Session: 20240828231302
Implantable Pulse Generator Implant Date: 20220209

## 2023-02-03 SURGERY — RIGHT/LEFT HEART CATH AND CORONARY ANGIOGRAPHY
Anesthesia: Moderate Sedation | Laterality: Bilateral

## 2023-02-03 MED ORDER — SODIUM CHLORIDE 0.9 % IV SOLN: 250.0000 mL | INTRAVENOUS | Status: DC | PRN

## 2023-02-03 MED ORDER — HEPARIN (PORCINE) IN NACL 1000-0.9 UT/500ML-% IV SOLN
INTRAVENOUS | Status: AC
  Filled 2023-02-03: qty 1000

## 2023-02-03 MED ORDER — FENTANYL CITRATE (PF) 100 MCG/2ML IJ SOLN
INTRAMUSCULAR | Status: AC
  Filled 2023-02-03: qty 2

## 2023-02-03 MED ORDER — VERAPAMIL HCL 2.5 MG/ML IV SOLN
INTRAVENOUS | Status: AC
  Filled 2023-02-03: qty 2

## 2023-02-03 MED ORDER — SODIUM CHLORIDE 0.9 % IV SOLN: INTRAVENOUS | Status: DC

## 2023-02-03 MED ORDER — MIDAZOLAM HCL 2 MG/2ML IJ SOLN
INTRAMUSCULAR | Status: AC
  Filled 2023-02-03: qty 2

## 2023-02-03 MED ORDER — IOHEXOL 300 MG/ML  SOLN
INTRAMUSCULAR | Status: DC | PRN
  Administered 2023-02-03: 20 mL

## 2023-02-03 MED ORDER — FENTANYL CITRATE (PF) 100 MCG/2ML IJ SOLN
INTRAMUSCULAR | Status: DC | PRN
  Administered 2023-02-03 (×2): 25 ug via INTRAVENOUS

## 2023-02-03 MED ORDER — HEPARIN SODIUM (PORCINE) 1000 UNIT/ML IJ SOLN
INTRAMUSCULAR | Status: AC
  Filled 2023-02-03: qty 10

## 2023-02-03 MED ORDER — MIDAZOLAM HCL 2 MG/2ML IJ SOLN
INTRAMUSCULAR | Status: DC | PRN
  Administered 2023-02-03 (×2): 1 mg via INTRAVENOUS

## 2023-02-03 MED ORDER — VERAPAMIL HCL 2.5 MG/ML IV SOLN
INTRAVENOUS | Status: DC | PRN
  Administered 2023-02-03: 2.5 mg via INTRA_ARTERIAL

## 2023-02-03 MED ORDER — SODIUM CHLORIDE 0.9% FLUSH: 3.0000 mL | INTRAVENOUS | Status: DC | PRN

## 2023-02-03 MED ORDER — SODIUM CHLORIDE 0.9% FLUSH: 3.0000 mL | Freq: Two times a day (BID) | INTRAVENOUS | Status: DC

## 2023-02-03 MED ORDER — ACETAMINOPHEN 325 MG PO TABS: 650.0000 mg | ORAL_TABLET | ORAL | Status: DC | PRN

## 2023-02-03 MED ORDER — HEPARIN SODIUM (PORCINE) 1000 UNIT/ML IJ SOLN
INTRAMUSCULAR | Status: DC | PRN
  Administered 2023-02-03: 4000 [IU] via INTRAVENOUS

## 2023-02-03 MED ORDER — LIDOCAINE HCL 1 % IJ SOLN
INTRAMUSCULAR | Status: AC
  Filled 2023-02-03: qty 20

## 2023-02-03 MED ORDER — HEPARIN (PORCINE) IN NACL 2000-0.9 UNIT/L-% IV SOLN
INTRAVENOUS | Status: DC | PRN
  Administered 2023-02-03: 1000 mL

## 2023-02-03 MED ORDER — ASPIRIN 81 MG PO CHEW: 81.0000 mg | CHEWABLE_TABLET | ORAL | Status: DC

## 2023-02-03 SURGICAL SUPPLY — 13 items
CATH BALLN WEDGE 5F 110CM (CATHETERS) IMPLANT
CATH INFINITI AMBI 5FR JK (CATHETERS) IMPLANT
DEVICE RAD TR BAND REGULAR (VASCULAR PRODUCTS) IMPLANT
DRAPE BRACHIAL (DRAPES) IMPLANT
GLIDESHEATH SLEND SS 6F .021 (SHEATH) IMPLANT
GUIDEWIRE EMER 3M J .025X150CM (WIRE) IMPLANT
GUIDEWIRE INQWIRE 1.5J.035X260 (WIRE) IMPLANT
INQWIRE 1.5J .035X260CM (WIRE) ×1
PACK CARDIAC CATH (CUSTOM PROCEDURE TRAY) ×2 IMPLANT
PROTECTION STATION PRESSURIZED (MISCELLANEOUS) ×1
SET ATX-X65L (MISCELLANEOUS) IMPLANT
SHEATH GLIDE SLENDER 4/5FR (SHEATH) IMPLANT
STATION PROTECTION PRESSURIZED (MISCELLANEOUS) IMPLANT

## 2023-02-03 NOTE — Interval H&P Note (Signed)
Cath Lab Visit (complete for each Cath Lab visit)  Clinical Evaluation Leading to the Procedure:   ACS: No.  Non-ACS:    Anginal Classification: CCS III  Anti-ischemic medical therapy: Maximal Therapy (2 or more classes of medications)  Non-Invasive Test Results: No non-invasive testing performed  Prior CABG: No previous CABG      History and Physical Interval Note:  02/03/2023 1:03 PM  Trilby Leaver  has presented today for surgery, with the diagnosis of R and L Cath    Unstable angina.  The various methods of treatment have been discussed with the patient and family. After consideration of risks, benefits and other options for treatment, the patient has consented to  Procedure(s): RIGHT/LEFT HEART CATH AND CORONARY ANGIOGRAPHY (Bilateral) as a surgical intervention.  The patient's history has been reviewed, patient examined, no change in status, stable for surgery.  I have reviewed the patient's chart and labs.  Questions were answered to the patient's satisfaction.     Lorine Bears

## 2023-02-04 ENCOUNTER — Encounter: Payer: Self-pay | Admitting: Cardiovascular Disease

## 2023-02-08 ENCOUNTER — Telehealth: Payer: Self-pay | Admitting: Cardiovascular Disease

## 2023-02-08 NOTE — Telephone Encounter (Signed)
Pt c/o BP issue: STAT if pt c/o blurred vision, one-sided weakness or slurred speech  1. What are your last 5 BP readings?   Mon, 9/2 AM - 156/80  HR 67                         166/85  HR 71 Sun, 9/1  174/84  HR 64                 174/86  HR 65  2. Are you having any other symptoms (ex. Dizziness, headache, blurred vision, passed out)?   Headaches  3. What is your BP issue?   Wife stated patient had a catheter placed last Thursday and his BP has still been trending high.  Wife wants advised on how to bring patient's BP down.

## 2023-02-08 NOTE — Telephone Encounter (Signed)
Spoke with patient's spouse and she stated that since the patient's cardiac cath (02/03/23) he has been experiencing some elevated blood pressures. Patient's spouse stated that she takes his blood pressure twice daily at the same time every day and that its been as high as 174/86 (Sunday 02/06/23) and this morning (02/08/23) it is 158/84. Patient complaining of headaches. Patient denies chest pain, dyspnea, blurred vision, fever, chills, nausea, vomiting and uncontrolled pain. Patient scheduled for 02/09/23

## 2023-02-09 ENCOUNTER — Encounter: Payer: Self-pay | Admitting: Cardiology

## 2023-02-09 ENCOUNTER — Ambulatory Visit: Payer: PPO | Attending: Cardiology | Admitting: Cardiology

## 2023-02-09 VITALS — BP 142/78 | HR 72 | Ht 69.0 in | Wt 186.4 lb

## 2023-02-09 DIAGNOSIS — E785 Hyperlipidemia, unspecified: Secondary | ICD-10-CM

## 2023-02-09 DIAGNOSIS — I25118 Atherosclerotic heart disease of native coronary artery with other forms of angina pectoris: Secondary | ICD-10-CM

## 2023-02-09 DIAGNOSIS — I25110 Atherosclerotic heart disease of native coronary artery with unstable angina pectoris: Secondary | ICD-10-CM | POA: Diagnosis not present

## 2023-02-09 DIAGNOSIS — I639 Cerebral infarction, unspecified: Secondary | ICD-10-CM | POA: Diagnosis not present

## 2023-02-09 DIAGNOSIS — J9611 Chronic respiratory failure with hypoxia: Secondary | ICD-10-CM

## 2023-02-09 DIAGNOSIS — I5032 Chronic diastolic (congestive) heart failure: Secondary | ICD-10-CM

## 2023-02-09 DIAGNOSIS — I4719 Other supraventricular tachycardia: Secondary | ICD-10-CM

## 2023-02-09 DIAGNOSIS — Z79899 Other long term (current) drug therapy: Secondary | ICD-10-CM | POA: Diagnosis not present

## 2023-02-09 DIAGNOSIS — I200 Unstable angina: Secondary | ICD-10-CM

## 2023-02-09 DIAGNOSIS — I471 Supraventricular tachycardia, unspecified: Secondary | ICD-10-CM | POA: Diagnosis not present

## 2023-02-09 DIAGNOSIS — I10 Essential (primary) hypertension: Secondary | ICD-10-CM

## 2023-02-09 DIAGNOSIS — N183 Chronic kidney disease, stage 3 unspecified: Secondary | ICD-10-CM

## 2023-02-09 MED ORDER — NITROGLYCERIN 0.4 MG SL SUBL: 0.4000 mg | SUBLINGUAL_TABLET | SUBLINGUAL | 2 refills | Status: DC | PRN

## 2023-02-09 MED ORDER — HYDRALAZINE HCL 50 MG PO TABS: 50.0000 mg | ORAL_TABLET | Freq: Two times a day (BID) | ORAL | 3 refills | Status: DC

## 2023-02-09 MED ORDER — ESOMEPRAZOLE MAGNESIUM 20 MG PO CPDR: 20.0000 mg | DELAYED_RELEASE_CAPSULE | Freq: Every day | ORAL | 3 refills | Status: DC

## 2023-02-09 MED ORDER — HYDRALAZINE HCL 50 MG PO TABS: 50.0000 mg | ORAL_TABLET | Freq: Two times a day (BID) | ORAL | Status: AC

## 2023-02-09 MED ORDER — RANOLAZINE ER 500 MG PO TB12: 500.0000 mg | ORAL_TABLET | Freq: Two times a day (BID) | ORAL | 3 refills | Status: DC

## 2023-02-09 NOTE — Progress Notes (Signed)
Cardiology Office Note:  .   Date:  02/09/2023  ID:  William Mueller, DOB 17-Oct-1950, MRN [id number] PCP: Ailene Ravel, MD   HeartCare Providers Cardiologist:  Julien Nordmann, MD    History of Present Illness: .   William Mueller is a 72 y.o. male with a past medical history of CAD status post PCI to the RCA in 12/2013 with chronic chest pain status post multiple repeat caths outlined below, PSVT, CKD stage III, cryptogenic stroke status post ILR monitored by EP, COPD secondary to long history of tobacco use with e-cigarette use on home oxygen, HFpEF, paroxysmal atrial tachycardia, labile hypertension with orthostatic hypotension, anemia, depression, anxiety, hypertension, hyperlipidemia, GERD, who presents today for follow-up status post left heart catheterization.  He was admitted to [facility] on 12/2013 with chest pain.  LHC showed left main was normal, mid LAD diffuse 20%, ostial D1 50%, proximal D1 50%, D2 30%, mid left circumflex moderately calcified with 30%, RCA mildly calcified with diffuse 30% proximally.  Vessel was occluded in the midsegment.  Faint left-to-right collaterals were noted.  Successful PCI/DES to the RCA.  Echo revealed EF 55-60%, mild LVH, G1 DD, elevated RA pressures.  He was admitted again 02/2014 with chest pain ruled out and left AMA.  Admitted again 02/2015 for chest pain.  Ruled out.  Underwent LHC which showed patent RCA stent with proximal RCA 50% stenosis, D1 80%, mid left circumflex to distal left circumflex 90%.  Admitted 09/2015 with chest pain, ruled out, left heart catheterization revealed patent RCA stent with unchanged nonobstructive disease when compared to prior left heart catheterization.  FFR of the RCA lesion nonsignificant at 0.93.  Medical management was advised.  Repeat echo in 06/2017 showed EF 65 to 70%.  Admitted 09/2018 with recurrent chest pain.  Troponin mildly elevated peaking at 0.04.  Repeat left heart catheter showed left main without significant  disease, D1 80%, proximal left circumflex 30%, mid to distal left circumflex 90%, proximal RCA 30% prior to the previously placed in the proximal to mid region without significant ISR.  LVEF 55%.  Again medical management was advised and he was started on Ranexa.  On his follow-up visit posthospitalization he had stated that his heart catheterization and fixed.  No intervention was performed and he was started on antianginal therapy.  He had also stopped the Ranexa on his own stating his chest pain was improved at his follow-up.  He underwent a Zio patch in 11/2018 which showed normal sinus, 2 SVT runs occurred with the fastest interval lasting 6 beats at a maximal rate of 152 bpm and the longest interval lasting 8 beats with an average heart rate of 111 bpm.  Echocardiogram on 12/19/2018 showed EF 60 to 65%, diastolic dysfunction, no RWMA, normal LVSF.  He was admitted 12/21 with a cryptogenic stroke.  Echo demonstrated EF 55 to 60%, no RWMA, G1 DD, normal LV systolic function.  Zio patch showed predominantly sinus rhythm with no evidence of atrial fibs/flutter or significant pauses.  He subsequently underwent a loop implantation with device interrogation showing no evidence of atrial fibs/flutter.  He was to be admitted to the hospital 8/25 - 01/30/2022 with complaints of chest pain.  High-sensitivity troponin was 19 with a delta and peak troponin of 28.  He remained symptom-free in the hospital and was discharged with outpatient follow-up.  He was seen for hospital follow-up on 02/02/2022 due to 3-week history of worsening substernal chest pressure as well as some intermittent  elevated blood pressure readings.  LHC 02/05/2022 showed no significant change in coronary anatomy since cardiac catheterization in 2020 with patent RCA stent with no significant restenosis.  Medical therapy was recommended along with titration of furosemide 40 mg daily.  Echo 02/15/2022 revealed EF 55 to 60%, normal LV systolic function, no  significant valvular abnormalities.  In 12/23 complaint of increased swelling since starting amlodipine for blood pressure control.  Amlodipine was stopped and he was started on hydralazine 25 mg twice daily.  He was last seen in clinic 01/28/2023 complaining of chest pain and chest tightness with jaw pain radiating to his shoulder, upper back, and upper arm.  It was relieved with nitro x 1 dose.  With worsening symptoms he underwent a right and left heart catheterization which showed no significant change in coronary anatomy since most recent catheterization in 2023.  Patent RCA stent with no significant restenosis.  Stable moderate proximal RCA stenosis and subocclusive disease of the small AV groove left circumflex artery and first diagonal.  He presented to the [facility] emergency department on 01/30/2023 with complaints of chest pain.  Blood pressure was noted to be suboptimally controlled at 183/99.  High-sensitivity troponin of 14.  Workup was unrevealing and he was discharged from the emergency room home.  He presented back to the [facility] emergency department 02/01/2023 with complaints of chest pain.  Blood pressure again was suboptimally controlled at 191/76.  Troponin was negative x 2 and he was pain-free on reevaluation was subsequently discharged home.  Since undergoing left heart catheterization on 02/03/2023 he has been he is called the triage line complaining of labile blood pressures.  Ranging 150s-170s.  He denied chest pain, dyspnea, blurred vision, fever, chills, nausea vomiting.  He returns to clinic today accompanied by his wife.  He recently had left heart catheterization completed which resulted in continuing with medical management.  On today he continues to have chest tightness with rest or exertion and elevated blood pressures.  He has gone to the emergency department twice since last time he was seen in clinic.  He has also been experiencing headaches, and fatigue.  Another concern that he had  today was an increase amount of acid reflux.  He states that he has been on Protonix for several years and is questioning if he can try Nexium for short period of time.  ROS: 10 point review of systems has been reviewed and considered negative except what is been listed in the HPI  Studies Reviewed: Marland Kitchen   EKG Interpretation Date/Time:  Wednesday February 09 2023 08:48:50 EDT Ventricular Rate:  72 PR Interval:  148 QRS Duration:  76 QT Interval:  410 QTC Calculation: 448 R Axis:   46  Text Interpretation: Normal sinus rhythm ST & T wave abnormality, consider inferior ischemia ST & T wave abnormality, consider anterolateral ischemia When compared with ECG of 01-Feb-2023 20:52, No significant change was found Confirmed by Charlsie Quest ([id number]) on 02/09/2023 8:57:53 AM   R/LHC 02/03/23 1.  No significant change in coronary anatomy since most recent cardiac catheterization in 2023.  Patent RCA stent with no significant restenosis.  Stable moderate proximal RCA stenosis and subocclusive disease in a small AV groove left circumflex artery and first diagonal. 2.  Left ventricular angiography was not performed due to chronic kidney disease. 3.  Right heart catheterization showed mildly elevated filling pressures, mild pulmonary hypertension and mildly reduced cardiac index.   Recommendations: Continue medical therapy for coronary artery disease and chronic diastolic heart  failure. 20 mL of contrast was used for catheterization.  TTE 02/15/22 1. Left ventricular ejection fraction, by estimation, is 55 to 60%. The  left ventricle has normal function. Left ventricular endocardial border  not optimally defined to evaluate regional wall motion. There is mild left  ventricular hypertrophy. Left  ventricular diastolic parameters were normal.   2. Right ventricular systolic function is normal. The right ventricular  size is normal. Tricuspid regurgitation signal is inadequate for assessing  PA pressure.    3. The mitral valve is normal in structure. No evidence of mitral valve  regurgitation. No evidence of mitral stenosis.   4. The aortic valve is normal in structure. Aortic valve regurgitation is  not visualized. No aortic stenosis is present.   5. The inferior vena cava is normal in size with greater than 50%  respiratory variability, suggesting right atrial pressure of 3 mmHg.    LHC 02/05/22   Prox LAD to Mid LAD lesion is 40% stenosed.   1st Diag lesion is 80% stenosed.   LPAV lesion is 95% stenosed.   Prox RCA lesion is 50% stenosed.   Previously placed Prox RCA to Mid RCA stent of unknown type is  widely patent.   1.  No significant change in coronary anatomy since most recent cardiac catheterization in 2020.  Patent RCA stent with no significant restenosis.  Stable moderate proximal RCA stenosis and subocclusive disease in a small AV groove left circumflex artery and first diagonal. 2.  Left ventricular angiography was not performed due to chronic kidney disease. 3.  Severely elevated left ventricular end-diastolic pressure 37 mmHg.   Recommendations: Recommend continuing medical therapy for coronary artery disease. I increased the dose of furosemide to 40 mg once daily.  Recommend an outpatient echocardiogram given her recent EF evaluation. Only 18 mL of contrast was used for the procedure.   Zio patch 05/2020: Normal sinus rhythm No patient triggered events min HR of 45 bpm, max HR of 138 bpm, and avg HR of 66 bpm.   8 Supraventricular Tachycardia/atrial tachycardia runs occurred, the run with the fastest interval lasting 5 beats with a max rate of 138 bpm, the longest lasting 13 beats with an avg rate of 110 bpm.    Possible Junctional Rhythm was present.  Isolated SVEs were rare (<1.0%), SVE Couplets were rare (<1.0%), and SVE Triplets were rare (<1.0%). Isolated VEs were rare (<1.0%, 41), VE Couplets were rare (<1.0%, 3), and VE Triplets were rare (<1.0%)   TTE  05/08/2020: 1. Left ventricular ejection fraction, by estimation, is 55 to 60%. The  left ventricle has normal function. The left ventricle has no regional  wall motion abnormalities. Left ventricular diastolic parameters are  consistent with Grade I diastolic  dysfunction (impaired relaxation).   2. Right ventricular systolic function is normal. The right ventricular  size is normal.    TTE 12/19/2018: 1. The left ventricle has normal systolic function with an ejection fraction of 60-65%. The cavity size was normal. There is mildly increased left ventricular wall thickness. Left ventricular diastolic Doppler parameters are consistent with impaired  relaxation. No evidence of left ventricular regional wall motion abnormalities.  2. The right ventricle has normal systolic function. The cavity was normal. There is no increase in right ventricular wall thickness. Right ventricular systolic pressure could not be assessed.  3. The aortic valve was not well visualized   Zio monitor 11/2018: Event Monitor   Normal sinus rhythm Avg HR of 104 bpm.  2 Supraventricular Tachycardia runs occurred, the run with the fastest interval lasting 6 beats with a max rate of 152 bpm,  the longest lasting 8 beats with an avg rate of 111 bpm.    Isolated SVEs were rare (<1.0%), SVE Couplets were rare (<1.0%), and SVE Triplets were rare (<1.0%). Isolated VEs were rare (<1.0%), VE Couplets were rare (<1.0%), and no VE Triplets were present. Ventricular Bigeminy was present.   Patient triggered events were not associated with significant arrhythmia   LHC 09/2018: Coronary angiography:  Coronary dominance: Right or codominant  Left mainstem:   Large vessel that bifurcates into the LAD and left circumflex, no significant disease noted  Left anterior descending (LAD):   Large vessel that extends to the apical region, diagonal branch 2, first diagonal branch is a small to moderate size vessel, severe diffuse  disease predominantly in the proximal region estimated at 80%, second diagonal branch moderate to large size vessel no significant disease  Left circumflex (LCx):  Large vessel with OM branch 2, 30% mid left circumflex disease, distal left circumflex after takeoff of OM 2 is severely diseased, small vessel.  There is a large OM branch with no significant disease  Right coronary artery (RCA):  Right dominant vessel with PL and PDA, no significant disease noted.  30% proximal RCA disease prior to long stent in the proximal to mid region, stent with no significant in-stent restenosis  Left ventriculography: Left ventricular systolic function is normal, LVEF is estimated at 55%, there is no significant mitral regurgitation , no significant aortic valve stenosis  Final Conclusions:   Significant disease of a small diagonal branch and small distal left circumflex branches Mean branches of the RCA, left circumflex, LAD and large diagonal are intact with no significant disease  Recommendations:  Etiology of his chest pain could be secondary to small vessel disease Pictures reviewed by interventional cardiology Medical management recommended We will recommend he start Ranexa 500 mg twice daily for 1 week then up to 1000 mg twice daily Continue other outpatient medications  Risk Assessment/Calculations:     HYPERTENSION CONTROL Vitals:   02/09/23 0842 02/09/23 0910  BP: (!) 154/80 (!) 142/78    The patient's blood pressure is elevated above target today.  In order to address the patient's elevated BP: A current anti-hypertensive medication was adjusted today.          Physical Exam:   VS:  BP (!) 142/78 (BP Location: Left Arm, Patient Position: Sitting, Cuff Size: Normal)   Pulse 72   Ht 5\' 9"  (1.753 m)   Wt 186 lb 6.4 oz (84.6 kg)   SpO2 99%   BMI 27.53 kg/m    Wt Readings from Last 3 Encounters:  02/09/23 186 lb 6.4 oz (84.6 kg)  02/03/23 180 lb (81.6 kg)  02/01/23 184 lb 1.4 oz  (83.5 kg)    GEN: Well nourished, well developed, chronically ill-appearing, in no acute distress NECK: No JVD; No carotid bruits CARDIAC: RRR, no murmurs, rubs, gallops RESPIRATORY:  Clear with diminished bases to auscultation without rales, wheezing or rhonchi, respirations are unlabored at rest on 2L of O2 via [place] ABDOMEN: Soft, non-tender, non-distended EXTREMITIES:  No edema; No deformity   ASSESSMENT AND PLAN: .   Coronary artery disease of native coronary arteries with chronic angina.  Last heart catheterization was completed 02/03/23 with no significant change from prior studies and patent stent.  Unfortunately he is intolerant to Imdur in the past due to headaches.  He had previously been on Ranexa and stopped it himself and deferred reinitiation due to CKD.  With his continued complaints of angina already being on Toprol-XL for antianginal will restart Ranexa 500 mg twice daily.  He is also being placed on a short run of Nexium in place of his Protonix to see if his GERD is impacting his chest tightness. Since he is status post heart catheterization has also been sent for follow-up BMP today to reevaluate kidney function.  EKG today reveals sinus rhythm with continue to ST depression in anterior leads and T wave inversion which is no change from prior studies.  He is continued on aspirin 81 mg daily, atorvastatin 80 mg daily, ezetimibe 10 mg daily clopidogrel 75 mg daily.  Primary hypertension with blood pressure today 154/80 with recheck of 142/78.  Patient states his chest discomfort worsening shortness of breath as blood pressures have been elevated at home as well.  He is continued on furosemide 20 mg 5 days a week, Toprol-XL 100 mg twice daily and hydralazine that has been increased to 50 mg twice daily.  Amlodipine was recently discontinued.  If increasing the sertraline does not help with improvement in blood pressure we will consider restarting amlodipine at a lower dose.  Encouraged to  continue to monitor blood pressures 1 to 2 hours post medications at home as well.  Mixed hyperlipidemia which she continues to remain at goal.  Last LDL 51.  Continue atorvastatin 80 mg daily and ezetimibe 10 mg daily.  Cryptogenic stroke with no new deficits.  No evidence of atrial fibrillation or atrial flutter on ILR interrogations.  He remains on DAPT as outlined above.  PSVT/atrial tachycardia quiescent.  He remains on Toprol-XL and milligrams twice daily without complaints of palpitations.  HFpEF with an LVEF of 55 to 60%.  Chronic shortness of breath and chronic hypoxic respiratory failure with COPD.  He appears to be euvolemic on exam today.  Is continued on Toprol and furosemide.  MRA and SGLT2 inhibitor deferred due to CKD.  Last echocardiogram was completed and 1 year ago.  If continued with changes in shortness of breath will consider repeat echo return.  Chronic hypoxic respiratory failure with chronic dyspnea that continues to be followed by pulmonary.  Pulmonary is continue to keep him on steroids and antibiotics for worsening symptoms of shortness of breath.  He is recommended to continue to follow with pulmonary.  CKD stage III where his baseline serum creatinine is 1.5-1.6.  Last creatinine was 1.64 prior to his procedure.  He is continued on furosemide 40 mg 5 days a week.  After left heart catheterization has been sent for repeat BMP today to reevaluate kidney function.       Dispo: Patient to return to clinic to see MD/APP in 3 weeks or sooner to reevaluate symptoms after current medication changes made today  Signed, Kaylah Chiasson, NP

## 2023-02-09 NOTE — Patient Instructions (Addendum)
Medication Instructions:  Your physician recommends the following medication changes.  STOP TAKING: Protonix  START TAKING: Ranexa 500 mg twice a day Nexium 20 mg once a day  INCREASE: Hydralazine 50 mg twice a day  *If you need a refill on your cardiac medications before your next appointment, please call your pharmacy*   Lab Work: Your provider would like for you to have following labs drawn today BMP.   If you have labs (blood work) drawn today and your tests are completely normal, you will receive your results only by: MyChart Message (if you have MyChart) OR A paper copy in the mail If you have any lab test that is abnormal or we need to change your treatment, we will call you to review the results.   Testing/Procedures: none   Follow-Up: At [facility], you and your health needs are our priority.  As part of our continuing mission to provide you with exceptional heart care, we have created designated Provider Care Teams.  These Care Teams include your primary Cardiologist (physician) and Advanced Practice Providers (APPs -  Physician Assistants and Nurse Practitioners) who all work together to provide you with the care you need, when you need it.  We recommend signing up for the patient portal called "MyChart".  Sign up information is provided on this After Visit Summary.  MyChart is used to connect with patients for Virtual Visits (Telemedicine).  Patients are able to view lab/test results, encounter notes, upcoming appointments, etc.  Non-urgent messages can be sent to your provider as well.   To learn more about what you can do with MyChart, go to [email or web address].    Your next appointment:   2 weeks  Provider:   Charlsie Quest, NP

## 2023-02-10 ENCOUNTER — Telehealth: Payer: Self-pay | Admitting: Cardiovascular Disease

## 2023-02-10 NOTE — Progress Notes (Signed)
Carelink Summary Report / Loop Recorder 

## 2023-02-10 NOTE — Telephone Encounter (Signed)
Pt's wife called stating she wanted to confirm esomeprazole is the same as nexium. Nurse informed pt esomeprazole was the generic name for medication.  Wife verbalized understanding.

## 2023-02-10 NOTE — Telephone Encounter (Signed)
Pt c/o medication issue:  1. Name of Medication: esomeprazole (NEXIUM) 20 MG capsule   2. How are you currently taking this medication (dosage and times per day)?   3. Are you having a reaction (difficulty breathing--STAT)?   4. What is your medication issue? Patient just picked up what she believes is this medication, but has further questions. Please advise.

## 2023-02-16 LAB — BASIC METABOLIC PANEL

## 2023-02-24 ENCOUNTER — Emergency Department: Payer: PPO

## 2023-02-24 ENCOUNTER — Other Ambulatory Visit: Payer: Self-pay

## 2023-02-24 ENCOUNTER — Emergency Department
Admission: EM | Admit: 2023-02-24 | Discharge: 2023-02-24 | Disposition: A | Discharge status: Home / Self Care | Payer: PPO | Attending: Emergency Medicine | Admitting: Emergency Medicine

## 2023-02-24 DIAGNOSIS — R0982 Postnasal drip: Secondary | ICD-10-CM

## 2023-02-24 DIAGNOSIS — G479 Sleep disorder, unspecified: Secondary | ICD-10-CM | POA: Diagnosis not present

## 2023-02-24 DIAGNOSIS — J019 Acute sinusitis, unspecified: Secondary | ICD-10-CM | POA: Insufficient documentation

## 2023-02-24 DIAGNOSIS — H538 Other visual disturbances: Secondary | ICD-10-CM | POA: Insufficient documentation

## 2023-02-24 DIAGNOSIS — I2510 Atherosclerotic heart disease of native coronary artery without angina pectoris: Secondary | ICD-10-CM | POA: Insufficient documentation

## 2023-02-24 DIAGNOSIS — R051 Acute cough: Secondary | ICD-10-CM

## 2023-02-24 DIAGNOSIS — R0602 Shortness of breath: Secondary | ICD-10-CM

## 2023-02-24 DIAGNOSIS — R0981 Nasal congestion: Secondary | ICD-10-CM | POA: Diagnosis present

## 2023-02-24 LAB — TROPONIN I (HIGH SENSITIVITY): Troponin I (High Sensitivity): 10 ng/L (ref <18)

## 2023-02-24 LAB — BASIC METABOLIC PANEL
Anion gap: 10 (ref 5–15)
BUN: 24 mg/dL — ABNORMAL HIGH (ref 8–23)
CO2: 29 mmol/L (ref 22–32)
Calcium: 9 mg/dL (ref 8.9–10.3)
Chloride: 96 mmol/L — ABNORMAL LOW (ref 98–111)
Creatinine, Ser: 2.01 mg/dL — ABNORMAL HIGH (ref 0.61–1.24)
GFR, Estimated: 34 mL/min — ABNORMAL LOW (ref >60)
Glucose, Bld: 112 mg/dL — ABNORMAL HIGH (ref 70–99)
Potassium: 3.8 mmol/L (ref 3.5–5.1)
Sodium: 135 mmol/L (ref 135–145)

## 2023-02-24 LAB — CBC
HCT: 39.5 % (ref 39.0–52.0)
Hemoglobin: 12.6 g/dL — ABNORMAL LOW (ref 13.0–17.0)
MCH: 26.9 pg (ref 26.0–34.0)
MCHC: 31.9 g/dL (ref 30.0–36.0)
MCV: 84.4 fL (ref 80.0–100.0)
Platelets: 304 10*3/uL (ref 150–400)
RBC: 4.68 MIL/uL (ref 4.22–5.81)
RDW: 13.1 % (ref 11.5–15.5)
WBC: 10.3 10*3/uL (ref 4.0–10.5)
nRBC: 0 % (ref 0.0–0.2)

## 2023-02-24 MED ORDER — AMOXICILLIN-POT CLAVULANATE 875-125 MG PO TABS
1.0000 | ORAL_TABLET | Freq: Once | ORAL | Status: AC
  Administered 2023-02-24: 1 via ORAL
  Filled 2023-02-24: qty 1

## 2023-02-24 MED ORDER — AMOXICILLIN-POT CLAVULANATE 875-125 MG PO TABS: 1.0000 | ORAL_TABLET | Freq: Two times a day (BID) | ORAL | 0 refills | Status: DC

## 2023-02-24 MED ORDER — AMOXICILLIN-POT CLAVULANATE 875-125 MG PO TABS: 1.0000 | ORAL_TABLET | Freq: Two times a day (BID) | ORAL | 0 refills | Status: AC

## 2023-02-24 NOTE — ED Provider Notes (Signed)
[facility] Provider Note    Event Date/Time   First MD Initiated Contact with Patient 02/24/23 1505     (approximate)   History   Shortness of Breath and Blurred Vision   HPI  William Mueller is a 72 y.o. male   Past medical history of CAD, lung disease, 2 L oxygen at baseline, here with sinus congestion, postnasal drip, mild cough.  No fever, no chest pain.  Occasional shortness of breath.  He was diagnosed with sinusitis 2 days ago and started on amoxicillin.  He has been taking it for 1-1/2 days.  He has had trouble sleeping.  This morning he ate breakfast went to take a nap had cough, shortness of breath, nasal congestion.  He expected the symptoms to get better with antibiotics.  He has been taking for about 1-1/2 days.  He denies chest pain.  Currently feels better, still some pressure in the sinuses but no shortness of breath.  Independent Historian contributed to assessment above: Wife at bedside corroborates information past medical history as above  External Medical Documents Reviewed: Cardiac catheterization in late August 2024 showing CAD, recommend medical management ongoing      Physical Exam   Triage Vital Signs: ED Triage Vitals  Encounter Vitals Group     BP 02/24/23 1149 (!) 154/89     Systolic BP Percentile --      Diastolic BP Percentile --      Pulse Rate 02/24/23 1149 80     Resp 02/24/23 1149 (!) 22     Temp 02/24/23 1149 97.6 F (36.4 C)     Temp src --      SpO2 02/24/23 1149 97 %     Weight 02/24/23 1150 180 lb (81.6 kg)     Height 02/24/23 1150 5\' 9"  (1.753 m)     Head Circumference --      Peak Flow --      Pain Score 02/24/23 1149 2     Pain Loc --      Pain Education --      Exclude from Growth Chart --     Most recent vital signs: Vitals:   02/24/23 1530 02/24/23 1604  BP: (!) 144/80   Pulse: (!) 53   Resp: 20   Temp:  98 F (36.7 C)  SpO2: 100%     General: Awake, no distress.  CV:  Good  peripheral perfusion.  Resp:  Normal effort.  Abd:  No distention.  Other:  Lungs clear without focality or wheezing, vital signs unremarkable, afebrile, nontoxic comfortable appearing patient no acute distress.  Appears euvolemic.   ED Results / Procedures / Treatments   Labs (all labs ordered are listed, but only abnormal results are displayed) Labs Reviewed  BASIC METABOLIC PANEL - Abnormal; Notable for the following components:      Result Value   Chloride 96 (*)    Glucose, Bld 112 (*)    BUN 24 (*)    Creatinine, Ser 2.01 (*)    GFR, Estimated 34 (*)    All other components within normal limits  CBC - Abnormal; Notable for the following components:   Hemoglobin 12.6 (*)    All other components within normal limits  TROPONIN I (HIGH SENSITIVITY)     I ordered and reviewed the above labs they are notable for white blood cell count is within normal limits.  EKG  ED ECG REPORT I, Pilar Jarvis, the attending physician, personally  viewed and interpreted this ECG.   Date: 02/24/2023  EKG Time: 1151  Rate: 76  Rhythm: sinus  Axis: nl  Intervals:none  ST&T Change: no stemi    RADIOLOGY I independently reviewed and interpreted CT of the head see no obvious bleeding or midline shift I also reviewed radiologist's formal read.   PROCEDURES:  Critical Care performed: No  Procedures   MEDICATIONS ORDERED IN ED: Medications  amoxicillin-clavulanate (AUGMENTIN) 875-125 MG per tablet 1 tablet (1 tablet Oral Given 02/24/23 1619)    IMPRESSION / MDM / ASSESSMENT AND PLAN / ED COURSE  I reviewed the triage vital signs and the nursing notes.                                Patient's presentation is most consistent with acute presentation with potential threat to life or bodily function.  Differential diagnosis includes, but is not limited to, sinusitis, URI, pneumonia, ACS, PE, sepsis   The patient is on the cardiac monitor to evaluate for evidence of arrhythmia  and/or significant heart rate changes.  MDM: Patient symptoms most consistent with his already diagnosed sinusitis in the setting of sinus pressure, postnasal drip, mild cough.  Some shortness of breath while coughing.  No chest pain I doubt ACS, PE or other cardiopulmonary emergency especially in the light of normal-appearing EKG without ischemic changes initial troponin negative.  He has some associated dizziness but none currently I think this is related to his sinus infection he was already prescribed meclizine and he will take accordingly.  CT of the head shows no acute intra-cranial abnormalities and I doubt stroke or head bleed in the setting of his symptoms overall well appearance.  I will switch his amoxicillin to Augmentin and have him continue his antibiotics and follow-up with his primary doctor.       FINAL CLINICAL IMPRESSION(S) / ED DIAGNOSES   Final diagnoses:  Acute sinusitis, recurrence not specified, unspecified location  Postnasal drip  Acute cough  Shortness of breath     Rx / DC Orders   ED Discharge Orders          Ordered    amoxicillin-clavulanate (AUGMENTIN) 875-125 MG tablet  2 times daily,   Status:  Discontinued        02/24/23 1614    amoxicillin-clavulanate (AUGMENTIN) 875-125 MG tablet  2 times daily        02/24/23 1614             Note:  This document was prepared using Dragon voice recognition software and may include unintentional dictation errors.    Pilar Jarvis, MD 02/24/23 [phone]

## 2023-02-24 NOTE — ED Triage Notes (Signed)
Pt to ED for shob, blurred vision for weeks. States had heart cath a month ago and "aint been right since". Also reports feeling like "nerves are shot and going to pass out" Also endorses chest tightness for "awhile" Wears 2 L [place] chronic

## 2023-02-24 NOTE — Discharge Instructions (Signed)
Instead of taking the amoxicillin, take the new antibiotic I prescribed today.  Fortunately your testing in the emergency department did not show any emergency conditions that would require hospitalizations or surgery today, like heart attack.  Take the meclizine prescribed by your doctor in case you do have some dizziness.  Thank you for choosing [place] for your health care today!  Please see your primary doctor this week for a follow up appointment.   If you have any new, worsening, or unexpected symptoms call your doctor right away or come back to the emergency department for reevaluation.  It was my pleasure to care for you today.   Daneil Dan Modesto Charon, MD

## 2023-02-25 ENCOUNTER — Ambulatory Visit: Payer: PPO | Admitting: Cardiology

## 2023-02-25 ENCOUNTER — Other Ambulatory Visit: Payer: Self-pay | Admitting: Cardiovascular Disease

## 2023-03-07 ENCOUNTER — Ambulatory Visit (INDEPENDENT_AMBULATORY_CARE_PROVIDER_SITE_OTHER): Payer: PPO

## 2023-03-07 DIAGNOSIS — I639 Cerebral infarction, unspecified: Secondary | ICD-10-CM | POA: Diagnosis not present

## 2023-03-08 LAB — CUP PACEART REMOTE DEVICE CHECK
Date Time Interrogation Session: 20240930231835
Implantable Pulse Generator Implant Date: 20220209

## 2023-03-21 NOTE — Progress Notes (Signed)
Carelink Summary Report / Loop Recorder 

## 2023-03-29 ENCOUNTER — Telehealth: Payer: Self-pay

## 2023-03-29 NOTE — Telephone Encounter (Signed)
Transition Care Management Follow-up Telephone Call Date of discharge and from where:  9/19 How have you been since you were released from the hospital? No doing good and has followed up with PCP Any questions or concerns? No  Items Reviewed: Did the pt receive and understand the discharge instructions provided? Yes  Medications obtained and verified? Yes Other? No  Any new allergies since your discharge? No  Dietary orders reviewed? No Do you have support at home? No   Follow up appointments reviewed:  PCP Hospital f/u appt confirmed? Yes  Scheduled to see PCP on  @ . Specialist Hospital f/u appt confirmed? No  Scheduled to see  on  @ . Are transportation arrangements needed? No  If their condition worsens, is the pt aware to call PCP or go to the Emergency Dept.? Yes Was the patient provided with contact information for the PCP's office or ED? Yes Was to pt encouraged to call back with questions or concerns? Yes

## 2023-04-11 ENCOUNTER — Ambulatory Visit (INDEPENDENT_AMBULATORY_CARE_PROVIDER_SITE_OTHER): Payer: PPO

## 2023-04-11 ENCOUNTER — Other Ambulatory Visit: Payer: Self-pay | Admitting: Pulmonary Disease

## 2023-04-11 DIAGNOSIS — I639 Cerebral infarction, unspecified: Secondary | ICD-10-CM

## 2023-04-11 DIAGNOSIS — J449 Chronic obstructive pulmonary disease, unspecified: Secondary | ICD-10-CM

## 2023-04-11 LAB — CUP PACEART REMOTE DEVICE CHECK
Date Time Interrogation Session: 20241102230231
Implantable Pulse Generator Implant Date: 20220209

## 2023-04-22 ENCOUNTER — Other Ambulatory Visit: Payer: Self-pay | Admitting: Cardiovascular Disease

## 2023-04-22 ENCOUNTER — Ambulatory Visit
Admission: RE | Admit: 2023-04-22 | Discharge: 2023-04-22 | Disposition: A | Discharge status: Home / Self Care | Payer: PPO | Source: Ambulatory Visit | Attending: Pulmonary Disease | Admitting: Pulmonary Disease

## 2023-04-22 DIAGNOSIS — J439 Emphysema, unspecified: Secondary | ICD-10-CM | POA: Insufficient documentation

## 2023-04-22 DIAGNOSIS — R911 Solitary pulmonary nodule: Secondary | ICD-10-CM | POA: Diagnosis not present

## 2023-04-22 DIAGNOSIS — I700 Atherosclerosis of aorta: Secondary | ICD-10-CM | POA: Insufficient documentation

## 2023-04-22 DIAGNOSIS — Z87891 Personal history of nicotine dependence: Secondary | ICD-10-CM | POA: Insufficient documentation

## 2023-04-22 DIAGNOSIS — J449 Chronic obstructive pulmonary disease, unspecified: Secondary | ICD-10-CM | POA: Diagnosis present

## 2023-04-22 DIAGNOSIS — Z122 Encounter for screening for malignant neoplasm of respiratory organs: Secondary | ICD-10-CM | POA: Diagnosis not present

## 2023-04-22 NOTE — Telephone Encounter (Signed)
Pt is scheduled on 12/1.

## 2023-04-22 NOTE — Telephone Encounter (Signed)
Please contact pt for future appointment. Pt overdue for 2 wk f/u.

## 2023-05-02 NOTE — Progress Notes (Signed)
Carelink Summary Report / Loop Recorder 

## 2023-05-16 ENCOUNTER — Ambulatory Visit (INDEPENDENT_AMBULATORY_CARE_PROVIDER_SITE_OTHER): Payer: PPO

## 2023-05-16 DIAGNOSIS — I639 Cerebral infarction, unspecified: Secondary | ICD-10-CM

## 2023-05-16 LAB — CUP PACEART REMOTE DEVICE CHECK
Date Time Interrogation Session: 20241208230439
Implantable Pulse Generator Implant Date: 20220209

## 2023-05-20 ENCOUNTER — Other Ambulatory Visit: Payer: Self-pay | Admitting: Pulmonary Disease

## 2023-05-20 DIAGNOSIS — R519 Headache, unspecified: Secondary | ICD-10-CM

## 2023-05-22 ENCOUNTER — Ambulatory Visit
Admission: RE | Admit: 2023-05-22 | Discharge: 2023-05-22 | Disposition: A | Discharge status: Home / Self Care | Payer: PPO | Source: Ambulatory Visit | Attending: Pulmonary Disease | Admitting: Pulmonary Disease

## 2023-05-22 DIAGNOSIS — R519 Headache, unspecified: Secondary | ICD-10-CM | POA: Insufficient documentation

## 2023-05-23 ENCOUNTER — Ambulatory Visit
Admission: RE | Admit: 2023-05-23 | Discharge: 2023-05-23 | Disposition: A | Discharge status: Home / Self Care | Payer: PPO | Source: Ambulatory Visit | Attending: Radiation Oncology | Admitting: Radiation Oncology

## 2023-05-23 VITALS — BP 131/68 | HR 65 | Temp 97.6°F | Resp 18 | Ht 69.0 in

## 2023-05-23 DIAGNOSIS — J449 Chronic obstructive pulmonary disease, unspecified: Secondary | ICD-10-CM | POA: Diagnosis not present

## 2023-05-23 DIAGNOSIS — I130 Hypertensive heart and chronic kidney disease with heart failure and stage 1 through stage 4 chronic kidney disease, or unspecified chronic kidney disease: Secondary | ICD-10-CM | POA: Diagnosis not present

## 2023-05-23 DIAGNOSIS — K219 Gastro-esophageal reflux disease without esophagitis: Secondary | ICD-10-CM | POA: Diagnosis not present

## 2023-05-23 DIAGNOSIS — G4730 Sleep apnea, unspecified: Secondary | ICD-10-CM | POA: Diagnosis not present

## 2023-05-23 DIAGNOSIS — I2510 Atherosclerotic heart disease of native coronary artery without angina pectoris: Secondary | ICD-10-CM | POA: Diagnosis not present

## 2023-05-23 DIAGNOSIS — I509 Heart failure, unspecified: Secondary | ICD-10-CM | POA: Insufficient documentation

## 2023-05-23 DIAGNOSIS — Z87891 Personal history of nicotine dependence: Secondary | ICD-10-CM | POA: Diagnosis not present

## 2023-05-23 DIAGNOSIS — Z9981 Dependence on supplemental oxygen: Secondary | ICD-10-CM | POA: Insufficient documentation

## 2023-05-23 DIAGNOSIS — Z7982 Long term (current) use of aspirin: Secondary | ICD-10-CM | POA: Diagnosis not present

## 2023-05-23 DIAGNOSIS — Z7902 Long term (current) use of antithrombotics/antiplatelets: Secondary | ICD-10-CM | POA: Insufficient documentation

## 2023-05-23 DIAGNOSIS — R918 Other nonspecific abnormal finding of lung field: Secondary | ICD-10-CM | POA: Insufficient documentation

## 2023-05-23 DIAGNOSIS — Z79899 Other long term (current) drug therapy: Secondary | ICD-10-CM | POA: Diagnosis not present

## 2023-05-23 DIAGNOSIS — E7800 Pure hypercholesterolemia, unspecified: Secondary | ICD-10-CM | POA: Diagnosis not present

## 2023-05-23 DIAGNOSIS — I252 Old myocardial infarction: Secondary | ICD-10-CM | POA: Insufficient documentation

## 2023-05-23 DIAGNOSIS — N1832 Chronic kidney disease, stage 3b: Secondary | ICD-10-CM | POA: Insufficient documentation

## 2023-05-23 DIAGNOSIS — Z8673 Personal history of transient ischemic attack (TIA), and cerebral infarction without residual deficits: Secondary | ICD-10-CM | POA: Diagnosis not present

## 2023-05-23 NOTE — Consult Note (Signed)
NEW PATIENT EVALUATION  Name: William Mueller  MRN: [id number]  Date:   05/23/2023     DOB: [birth year]   This 72 y.o. male patient presents to the clinic for initial evaluation of non-small cell lung cancer of the left lower lobe.  REFERRING PHYSICIAN: Hamrick, Durward Fortes, MD  CHIEF COMPLAINT:  Chief Complaint  Patient presents with   Lung Cancer    DIAGNOSIS: The encounter diagnosis was Lung mass.   PREVIOUS INVESTIGATIONS:  CT scans reviewed Clinical notes reviewed Labs reviewed  HPI: Patient is a 72 year old male who has been followed by Dr. Karna Christmas pulmonology for an increasing mass in his left lower lobe and compatible with a progressive non-small cell lung cancer.  Patient has advanced COPD stage IV is oxygen dependent.  He initially had a screening CT scan of his chest where left lower lobe lung lesion was identified.  Patient does have multiorgan failure with CKD 3 CHF COPD and CVA.  His FEV1 is 26% of predicted making biopsy of this lesion contraindicated.  His initial screening CT scan where lesion was identified was back in July 2023.  Most recent CT scan shows of central margin with aggressive appearing nodule in the left lower lobe highly concerning for primary bronchogenic carcinoma.  He also recently had an MRI scan of his brain for hearing problems in his left ear although that has not been formally read I do not see evidence of metastatic disease to his brain.  Patient has a mild cough no hemoptysis or chest tightness.  He is seen today for evaluation of his left lower lobe nodule.  PLANNED TREATMENT REGIMEN: SBRT  PAST MEDICAL HISTORY:  has a past medical history of Anginal pain (HCC) (06/2017), Anxiety, Bronchitis (06/2017), Chronic chest pain, Chronic kidney disease (CKD) stage G3b/A1, moderately decreased glomerular filtration rate (GFR) between 30-44 mL/min/1.73 square meter and albuminuria creatinine ratio less than 30 mg/g (HCC), Chronic lower back pain, COPD  (chronic obstructive pulmonary disease) (HCC), Coronary artery disease, Cryptogenic stroke (HCC) (05/07/2020), Daily headache, Depression, Diastolic dysfunction, Dyspnea, GERD (gastroesophageal reflux disease), Hearing loss, High cholesterol, Hypertension, Myocardial infarction (HCC) (2015), Nicotine addiction, Orthopnea, Sleep apnea, Status post placement of implantable loop recorder (07/16/2020), and Vertigo.    PAST SURGICAL HISTORY:  Past Surgical History:  Procedure Laterality Date   CARDIAC CATHETERIZATION     MC x 1 stent   CARDIAC CATHETERIZATION Left 03/12/2015   Procedure: Left Heart Cath and Coronary Angiography;  Surgeon: Marykay Lex, MD;  Location: [facility] INVASIVE CV LAB;  Service: Cardiovascular;  Laterality: Left;   CARDIAC CATHETERIZATION N/A 09/29/2015   Procedure: Left Heart Cath and Coronary Angiography;  Surgeon: Iran Ouch, MD;  Location: [facility] INVASIVE CV LAB;  Service: Cardiovascular;  Laterality: N/A;   CATARACT EXTRACTION W/PHACO Right 08/10/2017   Procedure: CATARACT EXTRACTION PHACO AND INTRAOCULAR LENS PLACEMENT (IOC) RIGHT;  Surgeon: Lockie Mola, MD;  Location: [facility];  Service: Ophthalmology;  Laterality: Right;   CATARACT EXTRACTION W/PHACO Left 10/15/2020   Procedure: CATARACT EXTRACTION PHACO AND INTRAOCULAR LENS PLACEMENT (IOC) LEFT;  Surgeon: Lockie Mola, MD;  Location: [facility];  Service: Ophthalmology;  Laterality: Left;  sleep apnea CDE 15.52 1:55.5 minutes 13.5%   CORONARY ANGIOPLASTY     STENT PLACEMENT   LEFT HEART CATH AND CORONARY ANGIOGRAPHY N/A 09/06/2018   Procedure: LEFT HEART CATH AND CORONARY ANGIOGRAPHY;  Surgeon: Antonieta Iba, MD;  Location: [facility] INVASIVE CV LAB;  Service: Cardiovascular;  Laterality: N/A;  LEFT HEART CATH AND CORONARY ANGIOGRAPHY N/A 02/05/2022   Procedure: LEFT HEART CATH AND CORONARY ANGIOGRAPHY;  Surgeon: Iran Ouch, MD;  Location: [facility] INVASIVE CV LAB;  Service:  Cardiovascular;  Laterality: N/A;   LEFT HEART CATHETERIZATION WITH CORONARY ANGIOGRAM N/A 12/12/2013   Procedure: LEFT HEART CATHETERIZATION WITH CORONARY ANGIOGRAM;  Surgeon: Iran Ouch, MD;  Location: [facility] CATH LAB;  Service: Cardiovascular;  Laterality: N/A;   RIGHT/LEFT HEART CATH AND CORONARY ANGIOGRAPHY Bilateral 02/03/2023   Procedure: RIGHT/LEFT HEART CATH AND CORONARY ANGIOGRAPHY;  Surgeon: Iran Ouch, MD;  Location: [facility] INVASIVE CV LAB;  Service: Cardiovascular;  Laterality: Bilateral;    FAMILY HISTORY: family history includes CVA in his mother; Coronary artery disease in his brother and father; Heart disease in his father; Heart disease (age of onset: 53) in his brother.  SOCIAL HISTORY:  reports that he quit smoking about 13 years ago. His smoking use included e-cigarettes and cigarettes. He started smoking about 61 years ago. He has a 144 pack-year smoking history. He has never used smokeless tobacco. He reports current alcohol use of about 6.0 standard drinks of alcohol per week. He reports that he does not use drugs.  ALLERGIES: Paroxetine hcl, Serotonin reuptake inhibitors (ssris), Diazepam, Doxycycline monohydrate, Escitalopram oxalate, Lorazepam, and Tetracyclines & related  MEDICATIONS:  Current Outpatient Medications  Medication Sig Dispense Refill   acetaminophen (TYLENOL) 500 MG tablet Take 1,000 mg by mouth daily as needed for headache.      albuterol (VENTOLIN HFA) 108 (90 Base) MCG/ACT inhaler Inhale 2 puffs into the lungs every 6 (six) hours as needed for wheezing or shortness of breath. 8 g 6   ALPRAZolam (NIRAVAM) 0.5 MG dissolvable tablet Take 0.5 mg by mouth at bedtime as needed for anxiety. qpm     ALPRAZolam (XANAX XR) 1 MG 24 hr tablet Take 1 mg by mouth daily. qam     aspirin 81 MG EC tablet Take 1 tablet (81 mg total) by mouth daily. 90 tablet 3   atorvastatin (LIPITOR) 80 MG tablet Take 1 tablet (80 mg total) by mouth daily. 90 tablet 3    clopidogrel (PLAVIX) 75 MG tablet Take 1 tablet by mouth once daily 90 tablet 3   COMBIVENT RESPIMAT 20-100 MCG/ACT AERS respimat INHALE 1 PUFF BY MOUTH 4 TIMES DAILY AS NEEDED FOR WHEEZING     esomeprazole (NEXIUM) 20 MG capsule Take 1 capsule (20 mg total) by mouth daily. 90 capsule 3   ezetimibe (ZETIA) 10 MG tablet Take 1 tablet by mouth once daily 90 tablet 3   Fluticasone-Umeclidin-Vilant (TRELEGY ELLIPTA) 100-62.5-25 MCG/ACT AEPB Inhale 1 puff into the lungs daily. 60 each 5   furosemide (LASIX) 20 MG tablet TAKE 1 TABLET BY MOUTH ONCE DAILY 5  DAYS  A  WEEK 45 tablet 0   hydrALAZINE (APRESOLINE) 50 MG tablet Take 1 tablet (50 mg total) by mouth 2 (two) times daily.     KLOR-CON M20 20 MEQ tablet Take 1 tablet (20 mEq total) by mouth daily. 5 days a week 90 tablet 3   metoprolol succinate (TOPROL-XL) 100 MG 24 hr tablet Take 1 tablet by mouth twice daily 180 tablet 1   nitroGLYCERIN (NITROSTAT) 0.4 MG SL tablet Place 1 tablet (0.4 mg total) under the tongue every 5 (five) minutes as needed for chest pain. 25 tablet 2   OXYGEN Inhale 2 L into the lungs daily as needed (oxygen).      predniSONE (DELTASONE) 5 MG tablet Take 5  mg by mouth daily. Daily for 90 days     ranolazine (RANEXA) 500 MG 12 hr tablet Take 1 tablet (500 mg total) by mouth 2 (two) times daily. 180 tablet 3   roflumilast (DALIRESP) 500 MCG TABS tablet Take 1 tablet (500 mcg total) by mouth daily. 30 tablet 6   No current facility-administered medications for this encounter.    ECOG PERFORMANCE STATUS:  0 - Asymptomatic  REVIEW OF SYSTEMS: Patient has COPD GERD hyperlipidemia hypertension previous stroke Patient denies any weight loss, fatigue, weakness, fever, chills or night sweats. Patient denies any loss of vision, blurred vision. Patient denies any ringing  of the ears or hearing loss. No irregular heartbeat. Patient denies heart murmur or history of fainting. Patient denies any chest pain or pain radiating to her  upper extremities. Patient denies any shortness of breath, difficulty breathing at night, cough or hemoptysis. Patient denies any swelling in the lower legs. Patient denies any nausea vomiting, vomiting of blood, or coffee ground material in the vomitus. Patient denies any stomach pain. Patient states has had normal bowel movements no significant constipation or diarrhea. Patient denies any dysuria, hematuria or significant nocturia. Patient denies any problems walking, swelling in the joints or loss of balance. Patient denies any skin changes, loss of hair or loss of weight. Patient denies any excessive worrying or anxiety or significant depression. Patient denies any problems with insomnia. Patient denies excessive thirst, polyuria, polydipsia. Patient denies any swollen glands, patient denies easy bruising or easy bleeding. Patient denies any recent infections, allergies or URI. Patient "s visual fields have not changed significantly in recent time.   PHYSICAL EXAM: BP 131/68   Pulse 65   Temp 97.6 F (36.4 C)   Resp 18   Ht 5\' 9"  (1.753 m)   SpO2 98%   BMI 26.58 kg/m  Well-developed male on nasal oxygen in NAD.  Well-developed well-nourished patient in NAD. HEENT reveals PERLA, EOMI, discs not visualized.  Oral cavity is clear. No oral mucosal lesions are identified. Neck is clear without evidence of cervical or supraclavicular adenopathy. Lungs are clear to A&P. Cardiac examination is essentially unremarkable with regular rate and rhythm without murmur rub or thrill. Abdomen is benign with no organomegaly or masses noted. Motor sensory and DTR levels are equal and symmetric in the upper and lower extremities. Cranial nerves II through XII are grossly intact. Proprioception is intact. No peripheral adenopathy or edema is identified. No motor or sensory levels are noted. Crude visual fields are within normal range.  LABORATORY DATA: Labs reviewed    RADIOLOGY RESULTS: Serial CT scans reviewed  compatible with above-stated findings none   IMPRESSION: Small cell lung cancer of the left lower lobe in 72 year old male chronic smoker with significant comorbidities precluding bronchoscopy and tissue diagnosis  PLAN: At this time of offered empiric SBRT to his left lower lobe lesion.  Will plan on delivering 60 Gray in 5 fractions.  Risks and benefits of treatment occluding extremely low side effect profile.-He may have some fatigue may develop a cough about a month out as well as some architectural distortion to the lung secondary to radiation fibrosis.  Patient wife comprehend the treatment plan well.  I personally set up and ordered CT simulation in the next week or 2.  Patient comprehends her recommendations well.  I would like to take this opportunity to thank you for allowing me to participate in the care of your patient.[name], MD

## 2023-06-07 ENCOUNTER — Telehealth: Payer: Self-pay | Admitting: Cardiovascular Disease

## 2023-06-07 NOTE — Telephone Encounter (Signed)
 Pt c/o BP issue: STAT if pt c/o blurred vision, one-sided weakness or slurred speech  1. What are your last 5 BP readings? 160/74 56, 153/79 70, 139/69 55, 153/74 67, 146/82 66  2. Are you having any other symptoms (ex. Dizziness, headache, blurred vision, passed out)? no  3. What is your BP issue? Wife said Pulmonary  Doctor said his BP meds probably need adjusting and to call us 

## 2023-06-09 ENCOUNTER — Other Ambulatory Visit: Payer: Self-pay | Admitting: Radiation Oncology

## 2023-06-09 DIAGNOSIS — C801 Malignant (primary) neoplasm, unspecified: Secondary | ICD-10-CM

## 2023-06-12 ENCOUNTER — Other Ambulatory Visit: Payer: Self-pay | Admitting: Cardiovascular Disease

## 2023-06-12 DIAGNOSIS — E785 Hyperlipidemia, unspecified: Secondary | ICD-10-CM

## 2023-06-13 DIAGNOSIS — J449 Chronic obstructive pulmonary disease, unspecified: Secondary | ICD-10-CM | POA: Diagnosis not present

## 2023-06-13 DIAGNOSIS — Z87890 Personal history of sex reassignment: Secondary | ICD-10-CM | POA: Diagnosis not present

## 2023-06-13 DIAGNOSIS — C3432 Malignant neoplasm of lower lobe, left bronchus or lung: Secondary | ICD-10-CM | POA: Diagnosis not present

## 2023-06-13 NOTE — Telephone Encounter (Signed)
 Called and spoke with wife per DPR. Notified her of the following from Dr. Gollan.  Last seen in 2023 Overdue for clinic visit Can we arrange follow-up appointment If he can continue to track blood pressure at home and bring in numbers Thx TGollan  Wife verbalizes understanding. Wife states that she will bring a list of blood pressures to upcoming appointment.  Patient scheduled with Dr. Gollan on 06/28/23. Patient offered a sooner appointment on 06/16/23 but he is unable to come that day due to another MD appointment.

## 2023-06-14 ENCOUNTER — Ambulatory Visit
Admission: RE | Admit: 2023-06-14 | Discharge: 2023-06-14 | Disposition: A | Discharge status: Home / Self Care | Payer: PPO | Source: Ambulatory Visit | Attending: Radiation Oncology | Admitting: Radiation Oncology

## 2023-06-14 DIAGNOSIS — Z8673 Personal history of transient ischemic attack (TIA), and cerebral infarction without residual deficits: Secondary | ICD-10-CM | POA: Insufficient documentation

## 2023-06-14 DIAGNOSIS — C801 Malignant (primary) neoplasm, unspecified: Secondary | ICD-10-CM

## 2023-06-14 DIAGNOSIS — Z79899 Other long term (current) drug therapy: Secondary | ICD-10-CM | POA: Insufficient documentation

## 2023-06-14 DIAGNOSIS — I130 Hypertensive heart and chronic kidney disease with heart failure and stage 1 through stage 4 chronic kidney disease, or unspecified chronic kidney disease: Secondary | ICD-10-CM | POA: Insufficient documentation

## 2023-06-14 DIAGNOSIS — Z510 Encounter for antineoplastic radiation therapy: Secondary | ICD-10-CM | POA: Insufficient documentation

## 2023-06-14 DIAGNOSIS — G4730 Sleep apnea, unspecified: Secondary | ICD-10-CM | POA: Insufficient documentation

## 2023-06-14 DIAGNOSIS — Z7902 Long term (current) use of antithrombotics/antiplatelets: Secondary | ICD-10-CM | POA: Insufficient documentation

## 2023-06-14 DIAGNOSIS — C3432 Malignant neoplasm of lower lobe, left bronchus or lung: Secondary | ICD-10-CM | POA: Insufficient documentation

## 2023-06-14 DIAGNOSIS — R918 Other nonspecific abnormal finding of lung field: Secondary | ICD-10-CM | POA: Insufficient documentation

## 2023-06-14 DIAGNOSIS — E7800 Pure hypercholesterolemia, unspecified: Secondary | ICD-10-CM | POA: Insufficient documentation

## 2023-06-14 DIAGNOSIS — N1832 Chronic kidney disease, stage 3b: Secondary | ICD-10-CM | POA: Insufficient documentation

## 2023-06-14 DIAGNOSIS — Z9981 Dependence on supplemental oxygen: Secondary | ICD-10-CM | POA: Insufficient documentation

## 2023-06-14 DIAGNOSIS — Z87890 Personal history of sex reassignment: Secondary | ICD-10-CM | POA: Diagnosis not present

## 2023-06-14 DIAGNOSIS — I509 Heart failure, unspecified: Secondary | ICD-10-CM | POA: Insufficient documentation

## 2023-06-14 DIAGNOSIS — I252 Old myocardial infarction: Secondary | ICD-10-CM | POA: Insufficient documentation

## 2023-06-14 DIAGNOSIS — Z87891 Personal history of nicotine dependence: Secondary | ICD-10-CM | POA: Insufficient documentation

## 2023-06-14 DIAGNOSIS — I2510 Atherosclerotic heart disease of native coronary artery without angina pectoris: Secondary | ICD-10-CM | POA: Insufficient documentation

## 2023-06-14 DIAGNOSIS — Z7982 Long term (current) use of aspirin: Secondary | ICD-10-CM | POA: Insufficient documentation

## 2023-06-14 DIAGNOSIS — J449 Chronic obstructive pulmonary disease, unspecified: Secondary | ICD-10-CM | POA: Insufficient documentation

## 2023-06-14 DIAGNOSIS — K219 Gastro-esophageal reflux disease without esophagitis: Secondary | ICD-10-CM | POA: Insufficient documentation

## 2023-06-16 DIAGNOSIS — C3432 Malignant neoplasm of lower lobe, left bronchus or lung: Secondary | ICD-10-CM | POA: Diagnosis not present

## 2023-06-16 DIAGNOSIS — Z510 Encounter for antineoplastic radiation therapy: Secondary | ICD-10-CM | POA: Diagnosis not present

## 2023-06-16 DIAGNOSIS — Z87890 Personal history of sex reassignment: Secondary | ICD-10-CM | POA: Diagnosis not present

## 2023-06-16 NOTE — Progress Notes (Signed)
 Date:  06/17/2023   ID:  William Mueller, DOB 04/22/1951, MRN [id number]  Patient Location:  [address] [place] [address]   Provider location:   [facility], [place] office  PCP:  Stephanie Charlene CROME, MD  Cardiologist:  Perla CRIS Nicolas  Chief Complaint  Patient presents with   Follow up     Patient c/o shortness of breath and BP fluctuating. Patient will start radiation for lung cancer next Friday.     History of Present Illness:    William Mueller is a 74 y.o. male past medical history of long history of smoking, COPD,  chronic renal insufficiency,  CR 1.7 CAD Admission 12/11/2013 to [facility] with chest pain cardiac catheterization lab : occluded mid RCA. Stent was placed.  catheterization in October 2016 with patent stent,disease of a small diagonal and distal circumflex not amenable to intervention.  paroxysmal atrial tachycardia Cardiac catheterization April 2017 History of alcohol abuse Catheterization April 2020, chest pain felt secondary to small vessel disease loop recorder implant. 07/16/2020, out of concern for stroke, rule out A-fib He presents today for follow-up of his coronary artery disease, stable angina and COPD   LOV with myself December 2023 Seen by one of our providers in clinic September 2024 Had completed cardiac catheterization though medical management was recommended Continued to have chest pain symptoms, was having GERD symptoms  Cardiac catheterization February 03, 2023 Patent RCA stent, moderate proximal RCA disease Little change from prior cardiac catheterization 2023 Medical management recommended  In follow-up today he reports he is scheduled to start treatment of lung cancer with radiation  Blood pressure running 140 up to 150 systolic Chronic shortness of breath, chronic stable angina symptoms Taking hydralazine  50 twice daily, metoprolol  succinate 100 twice daily for blood pressure  Takes Lasix  5 days  a week Off amlodipine , no significant leg swelling  Followed by nephrology: [facility], Dr. Nellene Creatinine 2.0  Remains on nasal cannula oxygen  2 L  On a prior clinic visit reported that he stopped drinking beers Prior heavy ETOH  EKG personally reviewed by myself on todays visit EKG Interpretation Date/Time:  Friday June 17 2023 11:11:45 EST Ventricular Rate:  55 PR Interval:  122 QRS Duration:  74 QT Interval:  434 QTC Calculation: 415 R Axis:   38  Text Interpretation: Sinus bradycardia T wave abnormality, consider lateral ischemia When compared with ECG of 24-Feb-2023 11:51, T wave inversion no longer evident in Inferior leads Confirmed by Perla Lye [phone]) on 06/17/2023 11:30:21 AM    Other past medical history reviewed Seen by EP 07/16/2020 May 07, 2020 concern for a stroke. Seen in the emergency department where imaging confirmed an acute infarct in his inferior left frontal lobe.  recommended to continue aspirin  and Plavix  indefinitely.   ZIO monitor performed after the event showed no episodes of atrial fibrillation   cardiac catheterization 09/06/2018  grossly patent vessels with Significant disease of a small diagonal branch and small distal left circumflex branches  other major branches of the RCA, left circumflex, LAD and large diagonal are intact with no significant disease  Mid Cx to Dist Cx lesion is 90% stenosed. Ost 1st Diag to 1st Diag lesion is 80% stenosed. Previously placed Prox RCA to Mid RCA stent (unknown type) is widely patent. Prox RCA lesion is 35% stenosed. The left ventricular ejection fraction is 55-65% by visual estimate. The left ventricular systolic function is normal. LV end diastolic pressure is normal.  There is no mitral valve regurgitation. Prox Cx lesion is 30% stenosed.    : Medical management start Ranexa  500 mg twice daily for 1 week then up to 1000 mg twice daily It was felt his chest pain was secondary to small vessel  disease  Lab work reviewed   total cholesterol 125 LDL 59 creatinine 1.73 normal electrolytes and LFTs   Past Medical History:  Diagnosis Date   Anginal pain (HCC) 06/2017   Anxiety    Bronchitis 06/2017   Chronic chest pain    Chronic kidney disease (CKD) stage G3b/A1, moderately decreased glomerular filtration rate (GFR) between 30-44 mL/min/1.73 square meter and albuminuria creatinine ratio less than 30 mg/g (HCC)    Chronic lower back pain    WITH LEG WEAKNESS   COPD (chronic obstructive pulmonary disease) (HCC)    EMPHYSEMA, O2 AT 2L PRN   Coronary artery disease    a. 12/2013 PCI: mRCA 100% with L to R collats s/p PCI/DES;  b. 06/2014 MV: no ischemia/infarct, EF 53%;  c. 03/2015 Cath: LM nl, LAD nl, D1 80 (1.26mm), LCX 60m (<1.46mm), OM1 nl, RCA 55p, patent stent, RPDA nl, EF 65%; d. 09/2015 Cath: LM nl, LAD 47m, D1 80 (<48mm), LCX 22m/d (<1.41mm), OM1 nl, RCA 55p (FFR 0.93), patent stent, RPDA nl-->Med Rx.   Cryptogenic stroke (HCC) 05/07/2020   Some right sided weakness and balance issues   Daily headache    Depression    Diastolic dysfunction    a. echo 12/2013: EF 55-60%, mild LVH, GR1DD, inf HK, elevated CVP, mildly dilated IVC suggestive of increased RA pressure   Dyspnea    WHEEZING   GERD (gastroesophageal reflux disease)    REFLUX   Hearing loss    High cholesterol    Hypertension    Myocardial infarction (HCC) 2015   Nicotine addiction    a. using eCigs.   Orthopnea    Sleep apnea    No CPAP   Status post placement of implantable loop recorder 07/16/2020   Vertigo    Past Surgical History:  Procedure Laterality Date   CARDIAC CATHETERIZATION     MC x 1 stent   CARDIAC CATHETERIZATION Left 03/12/2015   Procedure: Left Heart Cath and Coronary Angiography;  Surgeon: Alm LELON Clay, MD;  Location: [facility] INVASIVE CV LAB;  Service: Cardiovascular;  Laterality: Left;   CARDIAC CATHETERIZATION N/A 09/29/2015   Procedure: Left Heart Cath and Coronary Angiography;   Surgeon: Deatrice DELENA Cage, MD;  Location: [facility] INVASIVE CV LAB;  Service: Cardiovascular;  Laterality: N/A;   CATARACT EXTRACTION W/PHACO Right 08/10/2017   Procedure: CATARACT EXTRACTION PHACO AND INTRAOCULAR LENS PLACEMENT (IOC) RIGHT;  Surgeon: Mittie Gaskin, MD;  Location: [facility];  Service: Ophthalmology;  Laterality: Right;   CATARACT EXTRACTION W/PHACO Left 10/15/2020   Procedure: CATARACT EXTRACTION PHACO AND INTRAOCULAR LENS PLACEMENT (IOC) LEFT;  Surgeon: Mittie Gaskin, MD;  Location: [facility];  Service: Ophthalmology;  Laterality: Left;  sleep apnea CDE 15.52 1:55.5 minutes 13.5%   CORONARY ANGIOPLASTY     STENT PLACEMENT   LEFT HEART CATH AND CORONARY ANGIOGRAPHY N/A 09/06/2018   Procedure: LEFT HEART CATH AND CORONARY ANGIOGRAPHY;  Surgeon: Perla Evalene PARAS, MD;  Location: [facility] INVASIVE CV LAB;  Service: Cardiovascular;  Laterality: N/A;   LEFT HEART CATH AND CORONARY ANGIOGRAPHY N/A 02/05/2022   Procedure: LEFT HEART CATH AND CORONARY ANGIOGRAPHY;  Surgeon: Cage Deatrice DELENA, MD;  Location: [facility] INVASIVE CV LAB;  Service: Cardiovascular;  Laterality: N/A;  LEFT HEART CATHETERIZATION WITH CORONARY ANGIOGRAM N/A 12/12/2013   Procedure: LEFT HEART CATHETERIZATION WITH CORONARY ANGIOGRAM;  Surgeon: Deatrice DELENA Cage, MD;  Location: [facility] CATH LAB;  Service: Cardiovascular;  Laterality: N/A;   RIGHT/LEFT HEART CATH AND CORONARY ANGIOGRAPHY Bilateral 02/03/2023   Procedure: RIGHT/LEFT HEART CATH AND CORONARY ANGIOGRAPHY;  Surgeon: Cage Deatrice DELENA, MD;  Location: [facility] INVASIVE CV LAB;  Service: Cardiovascular;  Laterality: Bilateral;     Current Outpatient Medications on File Prior to Visit  Medication Sig Dispense Refill   acetaminophen  (TYLENOL ) 500 MG tablet Take 1,000 mg by mouth daily as needed for headache.      albuterol  (VENTOLIN  HFA) 108 (90 Base) MCG/ACT inhaler Inhale 2 puffs into the lungs every 6 (six) hours as needed for wheezing or shortness of  breath. 8 g 6   ALPRAZolam  (NIRAVAM ) 0.5 MG dissolvable tablet Take 0.5 mg by mouth at bedtime as needed for anxiety. qpm     ALPRAZolam  (XANAX  XR) 1 MG 24 hr tablet Take 1 mg by mouth daily. qam     aspirin  81 MG EC tablet Take 1 tablet (81 mg total) by mouth daily. 90 tablet 3   atorvastatin  (LIPITOR ) 80 MG tablet Take 1 tablet by mouth once daily 90 tablet 2   clopidogrel  (PLAVIX ) 75 MG tablet Take 1 tablet by mouth once daily 90 tablet 3   COMBIVENT RESPIMAT 20-100 MCG/ACT AERS respimat INHALE 1 PUFF BY MOUTH 4 TIMES DAILY AS NEEDED FOR WHEEZING     ezetimibe  (ZETIA ) 10 MG tablet Take 1 tablet by mouth once daily 90 tablet 3   Fluticasone -Umeclidin-Vilant (TRELEGY ELLIPTA ) 100-62.5-25 MCG/ACT AEPB Inhale 1 puff into the lungs daily. 60 each 5   furosemide  (LASIX ) 20 MG tablet TAKE 1 TABLET BY MOUTH ONCE DAILY 5  DAYS  A  WEEK 45 tablet 0   hydrALAZINE  (APRESOLINE ) 50 MG tablet Take 1 tablet (50 mg total) by mouth 2 (two) times daily.     KLOR-CON  M20 20 MEQ tablet Take 1 tablet (20 mEq total) by mouth daily. 5 days a week 90 tablet 3   metoprolol  succinate (TOPROL -XL) 100 MG 24 hr tablet Take 1 tablet by mouth twice daily 180 tablet 1   nitroGLYCERIN  (NITROSTAT ) 0.4 MG SL tablet Place 1 tablet (0.4 mg total) under the tongue every 5 (five) minutes as needed for chest pain. 25 tablet 2   OXYGEN  Inhale 2 L into the lungs daily as needed (oxygen ).      pantoprazole  (PROTONIX ) 40 MG tablet Take 40 mg by mouth 2 (two) times daily.     predniSONE  (DELTASONE ) 5 MG tablet Take 5 mg by mouth daily. Daily for 90 days     ranolazine  (RANEXA ) 500 MG 12 hr tablet Take 1 tablet (500 mg total) by mouth 2 (two) times daily. 180 tablet 3   roflumilast  (DALIRESP ) 500 MCG TABS tablet Take 1 tablet (500 mcg total) by mouth daily. 30 tablet 6   No current facility-administered medications on file prior to visit.    Allergies:   Paroxetine hcl, Serotonin reuptake inhibitors (ssris), Diazepam , Doxycycline  monohydrate, Escitalopram oxalate, Lorazepam , and Tetracyclines & related   Social History   Tobacco Use   Smoking status: Former    Current packs/day: 0.00    Average packs/day: 3.0 packs/day for 48.0 years (144.0 ttl pk-yrs)    Types: E-cigarettes, Cigarettes    Start date: 06/17/1961    Quit date: 06/17/2009    Years since quitting: 14.0   Smokeless tobacco:  Never   Tobacco comments:    quit e-cigs 03/2020  Vaping Use   Vaping status: Former   Quit date: 04/15/2020  Substance Use Topics   Alcohol use: Yes    Alcohol/week: 6.0 standard drinks of alcohol    Types: 6 Cans of beer per week    Comment: Occasional    Drug use: No     Family Hx: The patient's family history includes CVA in his mother; Coronary artery disease in his brother and father; Heart disease in his father; Heart disease (age of onset: 52) in his brother. There is no history of Kidney disease or Prostate cancer.  ROS:   Please see the history of present illness.    Review of Systems  Constitutional: Negative.   HENT: Negative.    Respiratory:  Positive for shortness of breath.   Cardiovascular:  Positive for leg swelling.  Gastrointestinal: Negative.   Musculoskeletal: Negative.   Neurological: Negative.   Psychiatric/Behavioral: Negative.    All other systems reviewed and are negative.    Labs/Other Tests and Data Reviewed:    Recent Labs: 02/01/2023: ALT 14 02/24/2023: BUN 24; Creatinine, Ser 2.01; Hemoglobin 12.6; Platelets 304; Potassium 3.8; Sodium 135   Recent Lipid Panel Lab Results  Component Value Date/Time   CHOL 98 05/08/2020 04:35 AM   CHOL 114 07/23/2019 11:55 AM   TRIG 79 05/08/2020 04:35 AM   HDL 31 (L) 05/08/2020 04:35 AM   HDL 38 (L) 07/23/2019 11:55 AM   CHOLHDL 3.2 05/08/2020 04:35 AM   LDLCALC 51 05/08/2020 04:35 AM   LDLCALC 53 07/23/2019 11:55 AM    Wt Readings from Last 3 Encounters:  06/17/23 184 lb (83.5 kg)  02/24/23 180 lb (81.6 kg)  02/09/23 186 lb 6.4 oz (84.6  kg)     Exam:    BP (!) 140/70 (BP Location: Left Arm, Patient Position: Sitting, Cuff Size: Normal)   Pulse (!) 55   Ht 5' 9 (1.753 m)   Wt 184 lb (83.5 kg)   SpO2 98% Comment: oxygen  @ 2 Liters  BMI 27.17 kg/m  Constitutional:  oriented to person, place, and time. No distress.  HENT:  Head: Grossly normal Eyes:  no discharge. No scleral icterus.  Neck: No JVD, no carotid bruits  Cardiovascular: Regular rate and rhythm, no murmurs appreciated Pulmonary/Chest: Clear to auscultation bilaterally, no wheezes or rails Abdominal: Soft.  no distension.  no tenderness.  Musculoskeletal: Normal range of motion Neurological:  normal muscle tone. Coordination normal. No atrophy Skin: Skin warm and dry Psychiatric: normal affect, pleasant   ASSESSMENT & PLAN:    Atherosclerosis of native coronary artery of native heart with unstable angina pectoris [facility]) Prior catheterization 2020, medical management recommended, likely small vessel disease Denies anginal symptoms on today's visit For chronic stable angina symptoms recommend he add isosorbide  30 mg daily If blood pressure runs low may be able to wean down on the hydralazine  Cholesterol at goal  Atrial tachycardia, paroxysmal (HCC) No arrhythmia on loop downloads Symptoms well-controlled, tolerating metoprolol  succinate 100 twice daily  Stroke documented on CT scan On aspirin  Plavix , cholesterol at goal Quit smoking Loop monitor is reviewed, no atrial fibrillation noted  CKD (chronic kidney disease) stage 3, GFR 30-59 ml/min (HCC) Followed by nephrology Known chronic kidney disease, creatinine of 2 Remains on Lasix  and potassium 5 days a week Is euvolemic  Centrilobular emphysema (HCC) On chronic oxygen , Stopped smoking Not very active, limited by COPD Reports he does not go outside the  house much  Tremor Prior history of alcohol, may be contributing to tremor Deconditioned, poor diet Reports that he stopped  drinking  Essential hypertension Leg swelling improved off amlodipine  Blood pressure running 140-1 50 systolic, recommend he start Imdur  30 daily If blood pressure runs low we can decrease hydralazine  dosing down to 25 twice daily   Signed, Advika Mclelland, MD  06/17/2023 11:34 AM    [facility] [address], [place], [place] [address]

## 2023-06-17 ENCOUNTER — Ambulatory Visit: Payer: PPO | Attending: Cardiovascular Disease | Admitting: Cardiovascular Disease

## 2023-06-17 VITALS — BP 140/70 | HR 55 | Ht 69.0 in | Wt 184.0 lb

## 2023-06-17 DIAGNOSIS — I471 Supraventricular tachycardia, unspecified: Secondary | ICD-10-CM | POA: Diagnosis not present

## 2023-06-17 DIAGNOSIS — I639 Cerebral infarction, unspecified: Secondary | ICD-10-CM | POA: Diagnosis not present

## 2023-06-17 DIAGNOSIS — I25118 Atherosclerotic heart disease of native coronary artery with other forms of angina pectoris: Secondary | ICD-10-CM | POA: Diagnosis not present

## 2023-06-17 DIAGNOSIS — I5032 Chronic diastolic (congestive) heart failure: Secondary | ICD-10-CM | POA: Diagnosis not present

## 2023-06-17 DIAGNOSIS — E785 Hyperlipidemia, unspecified: Secondary | ICD-10-CM

## 2023-06-17 DIAGNOSIS — I4719 Other supraventricular tachycardia: Secondary | ICD-10-CM

## 2023-06-17 DIAGNOSIS — I10 Essential (primary) hypertension: Secondary | ICD-10-CM

## 2023-06-17 DIAGNOSIS — J9611 Chronic respiratory failure with hypoxia: Secondary | ICD-10-CM

## 2023-06-17 DIAGNOSIS — N183 Chronic kidney disease, stage 3 unspecified: Secondary | ICD-10-CM

## 2023-06-17 MED ORDER — FUROSEMIDE 20 MG PO TABS: ORAL_TABLET | ORAL | 3 refills | Status: DC

## 2023-06-17 MED ORDER — EZETIMIBE 10 MG PO TABS: ORAL_TABLET | ORAL | 3 refills | Status: DC

## 2023-06-17 MED ORDER — KLOR-CON M20 20 MEQ PO TBCR: 20.0000 meq | EXTENDED_RELEASE_TABLET | Freq: Every day | ORAL | 3 refills | Status: DC

## 2023-06-17 MED ORDER — ISOSORBIDE MONONITRATE ER 30 MG PO TB24: 30.0000 mg | ORAL_TABLET | Freq: Every day | ORAL | 3 refills | Status: AC

## 2023-06-17 MED ORDER — METOPROLOL SUCCINATE ER 100 MG PO TB24: 100.0000 mg | ORAL_TABLET | Freq: Two times a day (BID) | ORAL | 3 refills | Status: DC

## 2023-06-17 NOTE — Patient Instructions (Addendum)
 Medication Instructions:  Please start imdur /Isosorbide  30 mg daily Monitor blood pressure If blood pressure runs low, cut the hydralazine  in 1/2 twice a day  If you need a refill on your cardiac medications before your next appointment, please call your pharmacy.   Lab work: No new labs needed  Testing/Procedures: No new testing needed  Follow-Up: At [facility], you and your health needs are our priority.  As part of our continuing mission to provide you with exceptional heart care, we have created designated Provider Care Teams.  These Care Teams include your primary Cardiologist (physician) and Advanced Practice Providers (APPs -  Physician Assistants and Nurse Practitioners) who all work together to provide you with the care you need, when you need it.  You will need a follow up appointment in 12 months  Providers on your designated Care Team:   Lonni Meager, NP Bernardino Bring, PA-C Cadence [place], [place]  COVID-19 Vaccine Information can be found at: [email or web address] For questions related to vaccine distribution or appointments, please email [email or web address] or call [phone].

## 2023-06-20 ENCOUNTER — Ambulatory Visit (INDEPENDENT_AMBULATORY_CARE_PROVIDER_SITE_OTHER): Payer: PPO

## 2023-06-20 DIAGNOSIS — I639 Cerebral infarction, unspecified: Secondary | ICD-10-CM

## 2023-06-20 LAB — CUP PACEART REMOTE DEVICE CHECK
Date Time Interrogation Session: 20250112230553
Implantable Pulse Generator Implant Date: 20220209

## 2023-06-24 ENCOUNTER — Other Ambulatory Visit: Payer: Self-pay

## 2023-06-24 ENCOUNTER — Ambulatory Visit
Admission: RE | Admit: 2023-06-24 | Discharge: 2023-06-24 | Disposition: A | Discharge status: Home / Self Care | Payer: PPO | Source: Ambulatory Visit | Attending: Radiation Oncology | Admitting: Radiation Oncology

## 2023-06-24 DIAGNOSIS — Z510 Encounter for antineoplastic radiation therapy: Secondary | ICD-10-CM | POA: Diagnosis not present

## 2023-06-24 LAB — RAD ONC ARIA SESSION SUMMARY
Course Elapsed Days: 0
Plan Fractions Treated to Date: 1
Plan Prescribed Dose Per Fraction: 12 Gy
Plan Total Fractions Prescribed: 5
Plan Total Prescribed Dose: 60 Gy
Reference Point Dosage Given to Date: 12 Gy
Reference Point Session Dosage Given: 12 Gy
Session Number: 1

## 2023-06-28 ENCOUNTER — Other Ambulatory Visit: Payer: Self-pay

## 2023-06-28 ENCOUNTER — Ambulatory Visit
Admission: RE | Admit: 2023-06-28 | Discharge: 2023-06-28 | Disposition: A | Discharge status: Home / Self Care | Payer: PPO | Source: Ambulatory Visit | Attending: Radiation Oncology | Admitting: Radiation Oncology

## 2023-06-28 ENCOUNTER — Ambulatory Visit: Payer: PPO | Admitting: Cardiovascular Disease

## 2023-06-28 DIAGNOSIS — Z510 Encounter for antineoplastic radiation therapy: Secondary | ICD-10-CM | POA: Diagnosis not present

## 2023-06-28 LAB — RAD ONC ARIA SESSION SUMMARY
Course Elapsed Days: 4
Plan Fractions Treated to Date: 2
Plan Prescribed Dose Per Fraction: 12 Gy
Plan Total Fractions Prescribed: 5
Plan Total Prescribed Dose: 60 Gy
Reference Point Dosage Given to Date: 24 Gy
Reference Point Session Dosage Given: 12 Gy
Session Number: 2

## 2023-06-30 ENCOUNTER — Ambulatory Visit: Payer: PPO

## 2023-07-05 ENCOUNTER — Other Ambulatory Visit: Payer: Self-pay

## 2023-07-05 ENCOUNTER — Ambulatory Visit
Admission: RE | Admit: 2023-07-05 | Discharge: 2023-07-05 | Disposition: A | Discharge status: Home / Self Care | Payer: PPO | Source: Ambulatory Visit | Attending: Radiation Oncology | Admitting: Radiation Oncology

## 2023-07-05 DIAGNOSIS — Z510 Encounter for antineoplastic radiation therapy: Secondary | ICD-10-CM | POA: Diagnosis not present

## 2023-07-05 LAB — RAD ONC ARIA SESSION SUMMARY
Course Elapsed Days: 11
Plan Fractions Treated to Date: 3
Plan Prescribed Dose Per Fraction: 12 Gy
Plan Total Fractions Prescribed: 5
Plan Total Prescribed Dose: 60 Gy
Reference Point Dosage Given to Date: 36 Gy
Reference Point Session Dosage Given: 12 Gy
Session Number: 3

## 2023-07-07 ENCOUNTER — Other Ambulatory Visit: Payer: Self-pay

## 2023-07-07 ENCOUNTER — Ambulatory Visit
Admission: RE | Admit: 2023-07-07 | Discharge: 2023-07-07 | Disposition: A | Discharge status: Home / Self Care | Payer: PPO | Source: Ambulatory Visit | Attending: Radiation Oncology | Admitting: Radiation Oncology

## 2023-07-07 DIAGNOSIS — Z510 Encounter for antineoplastic radiation therapy: Secondary | ICD-10-CM | POA: Diagnosis not present

## 2023-07-07 LAB — RAD ONC ARIA SESSION SUMMARY
Course Elapsed Days: 13
Plan Fractions Treated to Date: 4
Plan Prescribed Dose Per Fraction: 12 Gy
Plan Total Fractions Prescribed: 5
Plan Total Prescribed Dose: 60 Gy
Reference Point Dosage Given to Date: 48 Gy
Reference Point Session Dosage Given: 12 Gy
Session Number: 4

## 2023-07-12 ENCOUNTER — Other Ambulatory Visit: Payer: Self-pay

## 2023-07-12 ENCOUNTER — Ambulatory Visit
Admission: RE | Admit: 2023-07-12 | Discharge: 2023-07-12 | Disposition: A | Discharge status: Home / Self Care | Payer: PPO | Source: Ambulatory Visit | Attending: Radiation Oncology | Admitting: Radiation Oncology

## 2023-07-12 DIAGNOSIS — C3432 Malignant neoplasm of lower lobe, left bronchus or lung: Secondary | ICD-10-CM | POA: Insufficient documentation

## 2023-07-12 DIAGNOSIS — Z87890 Personal history of sex reassignment: Secondary | ICD-10-CM | POA: Diagnosis not present

## 2023-07-12 DIAGNOSIS — Z510 Encounter for antineoplastic radiation therapy: Secondary | ICD-10-CM | POA: Diagnosis not present

## 2023-07-12 LAB — RAD ONC ARIA SESSION SUMMARY
Course Elapsed Days: 18
Plan Fractions Treated to Date: 5
Plan Prescribed Dose Per Fraction: 12 Gy
Plan Total Fractions Prescribed: 5
Plan Total Prescribed Dose: 60 Gy
Reference Point Dosage Given to Date: 60 Gy
Reference Point Session Dosage Given: 12 Gy
Session Number: 5

## 2023-07-13 DIAGNOSIS — R5381 Other malaise: Secondary | ICD-10-CM | POA: Diagnosis not present

## 2023-07-13 DIAGNOSIS — J701 Chronic and other pulmonary manifestations due to radiation: Secondary | ICD-10-CM | POA: Diagnosis not present

## 2023-07-13 DIAGNOSIS — C3412 Malignant neoplasm of upper lobe, left bronchus or lung: Secondary | ICD-10-CM | POA: Diagnosis not present

## 2023-07-13 DIAGNOSIS — J449 Chronic obstructive pulmonary disease, unspecified: Secondary | ICD-10-CM | POA: Diagnosis not present

## 2023-07-13 DIAGNOSIS — J9611 Chronic respiratory failure with hypoxia: Secondary | ICD-10-CM | POA: Diagnosis not present

## 2023-07-13 NOTE — Radiation Completion Notes (Signed)
 Patient Name: William Mueller, William Mueller MRN: [id number] Date of Birth: 1951/02/28 Referring Physician: CHARLENE SINGLE, M.D. Date of Service: 2023-07-13 Radiation Oncologist: Marcey Penton, M.D. [facility]                             RADIATION ONCOLOGY END OF TREATMENT NOTE     Diagnosis: R91.8 Other nonspecific abnormal finding of lung field Intent: Curative     HPI: Patient is a 73 year old male who has been followed by Dr. Parris pulmonology for an increasing mass in his left lower lobe and compatible with a progressive non-small cell lung cancer.  Patient has advanced COPD stage IV is oxygen  dependent.  He initially had a screening CT scan of his chest where left lower lobe lung lesion was identified.  Patient does have multiorgan failure with CKD 3 CHF COPD and CVA.  His FEV1 is 26% of predicted making biopsy of this lesion contraindicated.  His initial screening CT scan where lesion was identified was back in July 2023.  Most recent CT scan shows of central margin with aggressive appearing nodule in the left lower lobe highly concerning for primary bronchogenic carcinoma.  He also recently had an MRI scan of his brain for hearing problems in his left ear although that has not been formally read I do not see evidence of metastatic disease to his brain.  Patient has a mild cough no hemoptysis or chest tightness.  He is seen today for evaluation of his left lower lobe nodule.      ==========DELIVERED PLANS==========  First Treatment Date: 2023-06-24 Last Treatment Date: 2023-07-12   Plan Name: Lung_L_SBRT Site: Lung, Left Technique: SBRT/SRT-IMRT Mode: Photon Dose Per Fraction: 12 Gy Prescribed Dose (Delivered / Prescribed): 60 Gy / 60 Gy Prescribed Fxs (Delivered / Prescribed): 5 / 5     ==========ON TREATMENT VISIT DATES========== 2023-06-24, 2023-06-28, 2023-07-05, 2023-07-07, 2023-07-12, 2023-07-12, 2023-07-12     ==========UPCOMING  VISITS========== 10/03/2023 [facility] OFFICE HOME REMOTE PACER CK [facility] DEVICE REMOTES  08/29/2023 [facility] OFFICE HOME REMOTE PACER [facility] DEVICE REMOTES  08/18/2023 [facility] RAD ONCOLOGY FOLLOW UP 30 Chrystal, Marcey, MD  07/25/2023 [facility] OFFICE HOME REMOTE PACER [facility] DEVICE REMOTES        ==========APPENDIX - ON TREATMENT VISIT NOTES==========   See weekly On Treatment Notes in Epic for details in the Media tab (listed as Progress notes on the On Treatment Visit Dates listed above).

## 2023-07-25 ENCOUNTER — Ambulatory Visit (INDEPENDENT_AMBULATORY_CARE_PROVIDER_SITE_OTHER): Payer: PPO

## 2023-07-25 DIAGNOSIS — I639 Cerebral infarction, unspecified: Secondary | ICD-10-CM

## 2023-07-26 LAB — CUP PACEART REMOTE DEVICE CHECK
Date Time Interrogation Session: 20250216230325
Implantable Pulse Generator Implant Date: 20220209

## 2023-07-29 ENCOUNTER — Telehealth: Payer: Self-pay | Admitting: Cardiovascular Disease

## 2023-07-29 DIAGNOSIS — E785 Hyperlipidemia, unspecified: Secondary | ICD-10-CM

## 2023-07-29 MED ORDER — FUROSEMIDE 20 MG PO TABS: ORAL_TABLET | ORAL | 3 refills | Status: DC

## 2023-07-29 MED ORDER — EZETIMIBE 10 MG PO TABS
ORAL_TABLET | ORAL | 3 refills | Status: DC
Start: 2023-07-29 — End: 2024-03-07

## 2023-07-29 NOTE — Telephone Encounter (Signed)
 Pt's medications were sent to pt's pharmacy as requested. Confirmation received.

## 2023-07-29 NOTE — Telephone Encounter (Signed)
*  STAT* If patient is at the pharmacy, call can be transferred to refill team.   1. Which medications need to be refilled? (please list name of each medication and dose if known) furosemide (LASIX) 20 MG tablet  and ezetimibe (ZETIA) 10 MG tablet     2. Would you like to learn more about the convenience, safety, & potential cost savings by using the [facility]? NO   3. Are you open to using the [facility] NO   4. Which pharmacy/location (including street and city if local pharmacy) is medication to be sent to?  [id number] - [place], [place] - [address] [facility]       5. Do they need a 30 day or 90 day supply? 90

## 2023-08-01 NOTE — Progress Notes (Signed)
 Carelink Summary Report / Loop Recorder

## 2023-08-08 ENCOUNTER — Telehealth: Payer: Self-pay | Admitting: Cardiovascular Disease

## 2023-08-08 MED ORDER — KLOR-CON M20 20 MEQ PO TBCR: 20.0000 meq | EXTENDED_RELEASE_TABLET | Freq: Every day | ORAL | 3 refills | Status: DC

## 2023-08-08 NOTE — Telephone Encounter (Signed)
*  STAT* If patient is at the pharmacy, call can be transferred to refill team.   1. Which medications need to be refilled? (please list name of each medication and dose if known)   KLOR-CON M20 20 MEQ tablet     4. Which pharmacy/location (including street and city if local pharmacy) is medication to be sent to?  [id number] - [place], [place] - [address]     5. Do they need a 30 day or 90 day supply? 90

## 2023-08-08 NOTE — Telephone Encounter (Signed)
 Requested Prescriptions   Signed Prescriptions Disp Refills   KLOR-CON M20 20 MEQ tablet 60 tablet 3    Sig: Take 1 tablet (20 mEq total) by mouth daily. 5 days a week    Authorizing Provider: Antonieta Iba    Ordering User: Kendrick Fries

## 2023-08-18 ENCOUNTER — Other Ambulatory Visit: Payer: Self-pay | Admitting: *Deleted

## 2023-08-18 ENCOUNTER — Ambulatory Visit
Admission: RE | Admit: 2023-08-18 | Discharge: 2023-08-18 | Discharge status: Home / Self Care | Payer: PPO | Source: Ambulatory Visit | Attending: Radiation Oncology | Admitting: Radiation Oncology

## 2023-08-18 VITALS — BP 130/74 | HR 66 | Resp 16 | Ht 69.0 in

## 2023-08-18 DIAGNOSIS — Z923 Personal history of irradiation: Secondary | ICD-10-CM | POA: Diagnosis not present

## 2023-08-18 DIAGNOSIS — R918 Other nonspecific abnormal finding of lung field: Secondary | ICD-10-CM | POA: Diagnosis not present

## 2023-08-18 DIAGNOSIS — K5900 Constipation, unspecified: Secondary | ICD-10-CM | POA: Diagnosis not present

## 2023-08-18 DIAGNOSIS — Z993 Dependence on wheelchair: Secondary | ICD-10-CM | POA: Insufficient documentation

## 2023-08-18 NOTE — Progress Notes (Signed)
 Radiation Oncology Follow up Note  Name: William Mueller   Date:   08/18/2023 MRN:  [id number] DOB: [birth year]    This 73 y.o. male presents to the clinic today for 1 month follow-up status post SBRT to his left lower lobe for stage I non-small cell lung cancer.  REFERRING PROVIDER: Hamrick, Durward Fortes, MD  HPI: Patient is a 73 year old male now out 1 month having completed SBRT to his left lower lobe for stage I non-small cell lung cancer.  Seen today in routine follow-up he is doing well.  He states he is not had any change in his breathing status or cough hemoptysis or chest tightness.  He is wheelchair-bound on continuous nasal oxygen.  Is having some problems with constipation..  COMPLICATIONS OF TREATMENT: none  FOLLOW UP COMPLIANCE: keeps appointments   PHYSICAL EXAM:  BP 130/74   Pulse 66   Resp 16   Ht 5\' 9"  (1.753 m)   SpO2 100%   PF (!) 2 L/min   BMI 27.17 kg/m  Wheelchair-bound male in nasal oxygen in NAD.  Well-developed well-nourished patient in NAD. HEENT reveals PERLA, EOMI, discs not visualized.  Oral cavity is clear. No oral mucosal lesions are identified. Neck is clear without evidence of cervical or supraclavicular adenopathy. Lungs are clear to A&P. Cardiac examination is essentially unremarkable with regular rate and rhythm without murmur rub or thrill. Abdomen is benign with no organomegaly or masses noted. Motor sensory and DTR levels are equal and symmetric in the upper and lower extremities. Cranial nerves II through XII are grossly intact. Proprioception is intact. No peripheral adenopathy or edema is identified. No motor or sensory levels are noted. Crude visual fields are within normal range.  RADIOLOGY RESULTS: CT scan ordered of the chest in 3 months  PLAN: Present time patient is doing well no significant change or side effects from his SBRT.  I have asked to see him back in 3 months with a CT scan of his chest prior to that visit.  I recommended MiraLAX for  his constipation he is also taking some fiber which is fine.  Patient knows to call with any concerns at any time.  I would like to take this opportunity to thank you for allowing me to participate in the care of your patient.William Miller, MD

## 2023-08-22 ENCOUNTER — Other Ambulatory Visit: Payer: Self-pay | Admitting: *Deleted

## 2023-08-22 ENCOUNTER — Telehealth: Payer: Self-pay | Admitting: *Deleted

## 2023-08-22 DIAGNOSIS — R918 Other nonspecific abnormal finding of lung field: Secondary | ICD-10-CM

## 2023-08-22 NOTE — Telephone Encounter (Signed)
 Called wife to inform her that CT order had been changed to CT WO contrast. Wife verbalized understanding.

## 2023-08-22 NOTE — Telephone Encounter (Signed)
 The wife called and said that the ct will has contrast. She states that he has stage 3 kidney disease and re dye. I called CT and asked about red dye, I was told that they do not use that any more and now it is clear. Per ct the patient only will get IV contrast and he would need a creat 6 weeks before the date or do the creat at the day of the  scan. But the wife has issue with the contrast due to her kidneys. She would like a call back about this

## 2023-08-29 ENCOUNTER — Ambulatory Visit (INDEPENDENT_AMBULATORY_CARE_PROVIDER_SITE_OTHER): Payer: PPO

## 2023-08-29 DIAGNOSIS — I639 Cerebral infarction, unspecified: Secondary | ICD-10-CM | POA: Diagnosis not present

## 2023-08-29 LAB — CUP PACEART REMOTE DEVICE CHECK
Date Time Interrogation Session: 20250323230225
Implantable Pulse Generator Implant Date: 20220209

## 2023-08-30 NOTE — Addendum Note (Signed)
 Addended by: Geralyn Flash D on: 08/30/2023 04:45 PM   Modules accepted: Orders

## 2023-08-30 NOTE — Progress Notes (Signed)
 Carelink Summary Report / Loop Recorder

## 2023-09-03 ENCOUNTER — Encounter: Payer: Self-pay | Admitting: Cardiology

## 2023-09-23 DIAGNOSIS — J432 Centrilobular emphysema: Secondary | ICD-10-CM | POA: Diagnosis not present

## 2023-10-03 ENCOUNTER — Ambulatory Visit (INDEPENDENT_AMBULATORY_CARE_PROVIDER_SITE_OTHER): Payer: PPO

## 2023-10-03 ENCOUNTER — Encounter: Payer: Self-pay | Admitting: Cardiology

## 2023-10-03 DIAGNOSIS — I639 Cerebral infarction, unspecified: Secondary | ICD-10-CM | POA: Diagnosis not present

## 2023-10-03 LAB — CUP PACEART REMOTE DEVICE CHECK
Date Time Interrogation Session: 20250427230528
Implantable Pulse Generator Implant Date: 20220209

## 2023-10-14 NOTE — Progress Notes (Signed)
 Carelink Summary Report / Loop Recorder

## 2023-10-18 DIAGNOSIS — F4001 Agoraphobia with panic disorder: Secondary | ICD-10-CM | POA: Diagnosis not present

## 2023-10-18 DIAGNOSIS — S99922A Unspecified injury of left foot, initial encounter: Secondary | ICD-10-CM | POA: Diagnosis not present

## 2023-10-18 DIAGNOSIS — N1832 Chronic kidney disease, stage 3b: Secondary | ICD-10-CM | POA: Diagnosis not present

## 2023-10-18 DIAGNOSIS — D692 Other nonthrombocytopenic purpura: Secondary | ICD-10-CM | POA: Diagnosis not present

## 2023-10-18 DIAGNOSIS — K219 Gastro-esophageal reflux disease without esophagitis: Secondary | ICD-10-CM | POA: Diagnosis not present

## 2023-10-18 DIAGNOSIS — J432 Centrilobular emphysema: Secondary | ICD-10-CM | POA: Diagnosis not present

## 2023-10-18 DIAGNOSIS — J9611 Chronic respiratory failure with hypoxia: Secondary | ICD-10-CM | POA: Diagnosis not present

## 2023-10-18 DIAGNOSIS — I272 Pulmonary hypertension, unspecified: Secondary | ICD-10-CM | POA: Diagnosis not present

## 2023-10-18 DIAGNOSIS — E782 Mixed hyperlipidemia: Secondary | ICD-10-CM | POA: Diagnosis not present

## 2023-10-18 DIAGNOSIS — I25119 Atherosclerotic heart disease of native coronary artery with unspecified angina pectoris: Secondary | ICD-10-CM | POA: Diagnosis not present

## 2023-10-18 DIAGNOSIS — R600 Localized edema: Secondary | ICD-10-CM | POA: Diagnosis not present

## 2023-10-18 DIAGNOSIS — G4700 Insomnia, unspecified: Secondary | ICD-10-CM | POA: Diagnosis not present

## 2023-10-19 DIAGNOSIS — Z79899 Other long term (current) drug therapy: Secondary | ICD-10-CM | POA: Diagnosis not present

## 2023-10-24 ENCOUNTER — Encounter: Payer: Self-pay | Admitting: Podiatry

## 2023-10-24 ENCOUNTER — Ambulatory Visit: Admitting: Podiatry

## 2023-10-24 DIAGNOSIS — L03032 Cellulitis of left toe: Secondary | ICD-10-CM | POA: Diagnosis not present

## 2023-10-24 DIAGNOSIS — M79676 Pain in unspecified toe(s): Secondary | ICD-10-CM

## 2023-10-24 DIAGNOSIS — B351 Tinea unguium: Secondary | ICD-10-CM | POA: Diagnosis not present

## 2023-10-24 NOTE — Progress Notes (Signed)
 Subjective:  Patient ID: William Mueller, male    DOB: 04-21-1951,  MRN: [id number] HPI Chief Complaint  Patient presents with   Nail Problem    Hallux left - split nail, tried to trim but got caught on the comforter 2 weeks ago, would like other nails trimmed, been using [place]-no help   New Patient (Initial Visit)    73 y.o. male presents with the above complaint.   ROS: Denies fever chills nausea mobic muscle aches pains calf pain back pain chest pain.  Past Medical History:  Diagnosis Date   Anginal pain (HCC) 06/2017   Anxiety    Bronchitis 06/2017   Chronic chest pain    Chronic kidney disease (CKD) stage G3b/A1, moderately decreased glomerular filtration rate (GFR) between 30-44 mL/min/1.73 square meter and albuminuria creatinine ratio less than 30 mg/g (HCC)    Chronic lower back pain    WITH LEG WEAKNESS   COPD (chronic obstructive pulmonary disease) (HCC)    EMPHYSEMA, O2 AT 2L PRN   Coronary artery disease    a. 12/2013 PCI: mRCA 100% with L to R collats s/p PCI/DES;  b. 06/2014 MV: no ischemia/infarct, EF 53%;  c. 03/2015 Cath: LM nl, LAD nl, D1 80 (1.64mm), LCX 75m (<1.49mm), OM1 nl, RCA 55p, patent stent, RPDA nl, EF 65%; d. 09/2015 Cath: LM nl, LAD 57m, D1 80 (<94mm), LCX 59m/d (<1.43mm), OM1 nl, RCA 55p (FFR 0.93), patent stent, RPDA nl-->Med Rx.   Cryptogenic stroke (HCC) 05/07/2020   Some right sided weakness and balance issues   Daily headache    Depression    Diastolic dysfunction    a. echo 12/2013: EF 55-60%, mild LVH, GR1DD, inf HK, elevated CVP, mildly dilated IVC suggestive of increased RA pressure   Dyspnea    WHEEZING   GERD (gastroesophageal reflux disease)    REFLUX   Hearing loss    High cholesterol    Hypertension    Myocardial infarction (HCC) 2015   Nicotine addiction    a. using eCigs.   Orthopnea    Sleep apnea    No CPAP   Status post placement of implantable loop recorder 07/16/2020   Vertigo    Past Surgical History:  Procedure Laterality  Date   CARDIAC CATHETERIZATION     MC x 1 stent   CARDIAC CATHETERIZATION Left 03/12/2015   Procedure: Left Heart Cath and Coronary Angiography;  Surgeon: Arleen Lacer, MD;  Location: [facility] INVASIVE CV LAB;  Service: Cardiovascular;  Laterality: Left;   CARDIAC CATHETERIZATION N/A 09/29/2015   Procedure: Left Heart Cath and Coronary Angiography;  Surgeon: Wenona Hamilton, MD;  Location: [facility] INVASIVE CV LAB;  Service: Cardiovascular;  Laterality: N/A;   CATARACT EXTRACTION W/PHACO Right 08/10/2017   Procedure: CATARACT EXTRACTION PHACO AND INTRAOCULAR LENS PLACEMENT (IOC) RIGHT;  Surgeon: Annell Kidney, MD;  Location: [facility] SUR[facility]ERY [facility];  Service: Ophthalmology;  Laterality: Right;   CATARACT EXTRACTION W/PHACO Left 10/15/2020   Procedure: CATARACT EXTRACTION PHACO AND INTRAOCULAR LENS PLACEMENT (IOC) LEFT;  Surgeon: Annell Kidney, MD;  Location: [facility];  Service: Ophthalmology;  Laterality: Left;  sleep apnea CDE 15.52 1:55.5 minutes 13.5%   CORONARY ANGIOPLASTY     STENT PLACEMENT   LEFT HEART CATH AND CORONARY ANGIOGRAPHY N/A 09/06/2018   Procedure: LEFT HEART CATH AND CORONARY ANGIOGRAPHY;  Surgeon: Devorah Fonder, MD;  Location: [facility] INVASIVE CV LAB;  Service: Cardiovascular;  Laterality: N/A;   LEFT HEART CATH AND CORONARY ANGIOGRAPHY N/A 02/05/2022  Procedure: LEFT HEART CATH AND CORONARY ANGIOGRAPHY;  Surgeon: Wenona Hamilton, MD;  Location: [facility] INVASIVE CV LAB;  Service: Cardiovascular;  Laterality: N/A;   LEFT HEART CATHETERIZATION WITH CORONARY ANGIOGRAM N/A 12/12/2013   Procedure: LEFT HEART CATHETERIZATION WITH CORONARY ANGIOGRAM;  Surgeon: Wenona Hamilton, MD;  Location: [facility] CATH LAB;  Service: Cardiovascular;  Laterality: N/A;   RIGHT/LEFT HEART CATH AND CORONARY ANGIOGRAPHY Bilateral 02/03/2023   Procedure: RIGHT/LEFT HEART CATH AND CORONARY ANGIOGRAPHY;  Surgeon: Wenona Hamilton, MD;  Location: [facility] INVASIVE CV LAB;  Service: Cardiovascular;   Laterality: Bilateral;    Current Outpatient Medications:    busPIRone (BUSPAR) 15 MG tablet, Take 15 mg by mouth 2 (two) times daily., Disp: , Rfl:    Dupilumab 300 MG/2ML SOAJ, Inject 300 mg into the skin., Disp: , Rfl:    acetaminophen  (TYLENOL ) 500 MG tablet, Take 1,000 mg by mouth daily as needed for headache. , Disp: , Rfl:    albuterol  (VENTOLIN  HFA) 108 (90 Base) MCG/ACT inhaler, Inhale 2 puffs into the lungs every 6 (six) hours as needed for wheezing or shortness of breath., Disp: 8 g, Rfl: 6   ALPRAZolam  (NIRAVAM ) 0.5 MG dissolvable tablet, Take 0.5 mg by mouth at bedtime as needed for anxiety. qpm, Disp: , Rfl:    ALPRAZolam  (XANAX  XR) 1 MG 24 hr tablet, Take 1 mg by mouth daily. qam, Disp: , Rfl:    amLODipine  (NORVASC ) 5 MG tablet, Take 5 mg by mouth daily., Disp: , Rfl:    aspirin  81 MG EC tablet, Take 1 tablet (81 mg total) by mouth daily., Disp: 90 tablet, Rfl: 3   atorvastatin  (LIPITOR ) 80 MG tablet, Take 1 tablet by mouth once daily, Disp: 90 tablet, Rfl: 2   clopidogrel  (PLAVIX ) 75 MG tablet, Take 1 tablet by mouth once daily, Disp: 90 tablet, Rfl: 3   COMBIVENT RESPIMAT 20-100 MCG/ACT AERS respimat, INHALE 1 PUFF BY MOUTH 4 TIMES DAILY AS NEEDED FOR WHEEZING, Disp: , Rfl:    EPINEPHrine  0.3 mg/0.3 mL IJ SOAJ injection, Inject 0.3 mg into the muscle as needed., Disp: , Rfl:    ezetimibe  (ZETIA ) 10 MG tablet, Take 1 tablet by mouth once daily, Disp: 90 tablet, Rfl: 3   Fluticasone -Umeclidin-Vilant (TRELEGY ELLIPTA ) 100-62.5-25 MCG/ACT AEPB, Inhale 1 puff into the lungs daily., Disp: 60 each, Rfl: 5   furosemide  (LASIX ) 20 MG tablet, TAKE 1 TABLET BY MOUTH ONCE DAILY 5  DAYS  A  WEEK, Disp: 75 tablet, Rfl: 3   hydrALAZINE  (APRESOLINE ) 50 MG tablet, Take 1 tablet (50 mg total) by mouth 2 (two) times daily., Disp: , Rfl:    isosorbide  mononitrate (IMDUR ) 30 MG 24 hr tablet, Take 1 tablet (30 mg total) by mouth daily., Disp: 90 tablet, Rfl: 3   KLOR-CON  M20 20 MEQ tablet, Take 1  tablet (20 mEq total) by mouth daily. 5 days a week, Disp: 60 tablet, Rfl: 3   meclizine  (ANTIVERT ) 12.5 MG tablet, Take 12.5 mg by mouth 3 (three) times daily as needed for dizziness., Disp: , Rfl:    metoprolol  succinate (TOPROL -XL) 100 MG 24 hr tablet, Take 1 tablet (100 mg total) by mouth 2 (two) times daily., Disp: 180 tablet, Rfl: 3   nitroGLYCERIN  (NITROSTAT ) 0.4 MG SL tablet, Place 1 tablet (0.4 mg total) under the tongue every 5 (five) minutes as needed for chest pain., Disp: 25 tablet, Rfl: 2   OXYGEN , Inhale 2 L into the lungs daily as needed (oxygen ). , Disp: , Rfl:  pantoprazole  (PROTONIX ) 40 MG tablet, Take 40 mg by mouth 2 (two) times daily., Disp: , Rfl:    predniSONE  (DELTASONE ) 5 MG tablet, Take 5 mg by mouth daily. Daily for 90 days, Disp: , Rfl:    ranolazine  (RANEXA ) 500 MG 12 hr tablet, Take 1 tablet (500 mg total) by mouth 2 (two) times daily., Disp: 180 tablet, Rfl: 3   roflumilast  (DALIRESP ) 500 MCG TABS tablet, Take 1 tablet (500 mcg total) by mouth daily., Disp: 30 tablet, Rfl: 6  Allergies  Allergen Reactions   Paroxetine Hcl Shortness Of Breath and Palpitations   Serotonin Reuptake Inhibitors (Ssris) Shortness Of Breath and Palpitations   Diazepam  Other (See Comments)    Rapid heartrate   Doxycycline Monohydrate Other (See Comments)    Unknown allergic reaction   Escitalopram Oxalate Other (See Comments)    serotonin Syndrome   Lorazepam  Other (See Comments)    Unknown allergic reaction   Tetracyclines & Related Itching and Rash   Review of Systems Objective:  There were no vitals filed for this visit.  General: Well developed, nourished, in no acute distress, alert and oriented x3   Dermatological: Skin is warm, dry and supple bilateral. Nails x 10 are thick yellow dystrophic clinically mycotic.  Hallux nail left to split down the middle and demonstrates erythema proximally along the nail fold.  This is exquisitely painful on palpation no purulence is  noted.; remaining integument appears unremarkable at this time. There are no open sores, no preulcerative lesions, no rash or signs of infection present.  Vascular: Dorsalis Pedis artery and Posterior Tibial artery pedal pulses are 2/4 bilateral with immedate capillary fill time. Pedal hair growth present. No varicosities and no lower extremity edema present bilateral.   Neruologic: Grossly intact via light touch bilateral. Vibratory intact via tuning fork bilateral. Protective threshold with Semmes Wienstein monofilament intact to all pedal sites bilateral. Patellar and Achilles deep tendon reflexes 2+ bilateral. No Babinski or clonus noted bilateral.   Musculoskeletal: No gross boney pedal deformities bilateral. No pain, crepitus, or limitation noted with foot and ankle range of motion bilateral. Muscular strength 5/5 in all groups tested bilateral.  Gait: Unassisted, Nonantalgic.    Radiographs:  None taken  Assessment & Plan:   Assessment: Pain in limb secondary to onychomycosis and paronychia associated with the fracture nail hallux left.  Plan: Debrided toenails 1 through 5 bilateral secondary to pain.  Also performed a incision and drainage with a partial nail avulsion temporary in nature with a clean up of all necrotic tissue after local anesthesia was injected about the left hallux.  He tolerated procedure well.  He will start soaking the toe Epsom salts and warm water he will follow-up with me as needed.     Verlene Glantz T. [place], [place]

## 2023-11-02 DIAGNOSIS — I252 Old myocardial infarction: Secondary | ICD-10-CM | POA: Diagnosis not present

## 2023-11-02 DIAGNOSIS — N1832 Chronic kidney disease, stage 3b: Secondary | ICD-10-CM | POA: Diagnosis not present

## 2023-11-02 DIAGNOSIS — J9611 Chronic respiratory failure with hypoxia: Secondary | ICD-10-CM | POA: Diagnosis not present

## 2023-11-02 DIAGNOSIS — Z87891 Personal history of nicotine dependence: Secondary | ICD-10-CM | POA: Diagnosis not present

## 2023-11-02 DIAGNOSIS — N529 Male erectile dysfunction, unspecified: Secondary | ICD-10-CM | POA: Diagnosis not present

## 2023-11-02 DIAGNOSIS — K219 Gastro-esophageal reflux disease without esophagitis: Secondary | ICD-10-CM | POA: Diagnosis not present

## 2023-11-02 DIAGNOSIS — Z9841 Cataract extraction status, right eye: Secondary | ICD-10-CM | POA: Diagnosis not present

## 2023-11-02 DIAGNOSIS — Z8673 Personal history of transient ischemic attack (TIA), and cerebral infarction without residual deficits: Secondary | ICD-10-CM | POA: Diagnosis not present

## 2023-11-02 DIAGNOSIS — Z955 Presence of coronary angioplasty implant and graft: Secondary | ICD-10-CM | POA: Diagnosis not present

## 2023-11-02 DIAGNOSIS — E782 Mixed hyperlipidemia: Secondary | ICD-10-CM | POA: Diagnosis not present

## 2023-11-02 DIAGNOSIS — E538 Deficiency of other specified B group vitamins: Secondary | ICD-10-CM | POA: Diagnosis not present

## 2023-11-02 DIAGNOSIS — Z7951 Long term (current) use of inhaled steroids: Secondary | ICD-10-CM | POA: Diagnosis not present

## 2023-11-02 DIAGNOSIS — J432 Centrilobular emphysema: Secondary | ICD-10-CM | POA: Diagnosis not present

## 2023-11-02 DIAGNOSIS — Z7902 Long term (current) use of antithrombotics/antiplatelets: Secondary | ICD-10-CM | POA: Diagnosis not present

## 2023-11-02 DIAGNOSIS — F4001 Agoraphobia with panic disorder: Secondary | ICD-10-CM | POA: Diagnosis not present

## 2023-11-02 DIAGNOSIS — G4700 Insomnia, unspecified: Secondary | ICD-10-CM | POA: Diagnosis not present

## 2023-11-02 DIAGNOSIS — I130 Hypertensive heart and chronic kidney disease with heart failure and stage 1 through stage 4 chronic kidney disease, or unspecified chronic kidney disease: Secondary | ICD-10-CM | POA: Diagnosis not present

## 2023-11-02 DIAGNOSIS — I2510 Atherosclerotic heart disease of native coronary artery without angina pectoris: Secondary | ICD-10-CM | POA: Diagnosis not present

## 2023-11-02 DIAGNOSIS — R918 Other nonspecific abnormal finding of lung field: Secondary | ICD-10-CM | POA: Diagnosis not present

## 2023-11-02 DIAGNOSIS — D692 Other nonthrombocytopenic purpura: Secondary | ICD-10-CM | POA: Diagnosis not present

## 2023-11-02 DIAGNOSIS — Z7982 Long term (current) use of aspirin: Secondary | ICD-10-CM | POA: Diagnosis not present

## 2023-11-02 DIAGNOSIS — I272 Pulmonary hypertension, unspecified: Secondary | ICD-10-CM | POA: Diagnosis not present

## 2023-11-02 DIAGNOSIS — I509 Heart failure, unspecified: Secondary | ICD-10-CM | POA: Diagnosis not present

## 2023-11-02 DIAGNOSIS — Z9981 Dependence on supplemental oxygen: Secondary | ICD-10-CM | POA: Diagnosis not present

## 2023-11-07 ENCOUNTER — Ambulatory Visit (INDEPENDENT_AMBULATORY_CARE_PROVIDER_SITE_OTHER)

## 2023-11-07 ENCOUNTER — Ambulatory Visit: Payer: Self-pay | Admitting: Cardiology

## 2023-11-07 DIAGNOSIS — I639 Cerebral infarction, unspecified: Secondary | ICD-10-CM

## 2023-11-07 LAB — CUP PACEART REMOTE DEVICE CHECK
Date Time Interrogation Session: 20250601231157
Implantable Pulse Generator Implant Date: 20220209

## 2023-11-10 DIAGNOSIS — J9611 Chronic respiratory failure with hypoxia: Secondary | ICD-10-CM | POA: Diagnosis not present

## 2023-11-10 DIAGNOSIS — J449 Chronic obstructive pulmonary disease, unspecified: Secondary | ICD-10-CM | POA: Diagnosis not present

## 2023-11-10 DIAGNOSIS — H9193 Unspecified hearing loss, bilateral: Secondary | ICD-10-CM | POA: Diagnosis not present

## 2023-11-10 DIAGNOSIS — C3492 Malignant neoplasm of unspecified part of left bronchus or lung: Secondary | ICD-10-CM | POA: Diagnosis not present

## 2023-11-11 DIAGNOSIS — J449 Chronic obstructive pulmonary disease, unspecified: Secondary | ICD-10-CM | POA: Diagnosis not present

## 2023-11-18 ENCOUNTER — Other Ambulatory Visit

## 2023-11-18 ENCOUNTER — Ambulatory Visit
Admission: RE | Admit: 2023-11-18 | Discharge: 2023-11-18 | Disposition: A | Discharge status: Home / Self Care | Source: Ambulatory Visit | Attending: Radiation Oncology | Admitting: Radiation Oncology

## 2023-11-18 DIAGNOSIS — I700 Atherosclerosis of aorta: Secondary | ICD-10-CM | POA: Diagnosis not present

## 2023-11-18 DIAGNOSIS — J439 Emphysema, unspecified: Secondary | ICD-10-CM | POA: Diagnosis not present

## 2023-11-18 DIAGNOSIS — R918 Other nonspecific abnormal finding of lung field: Secondary | ICD-10-CM | POA: Diagnosis not present

## 2023-11-18 DIAGNOSIS — C3490 Malignant neoplasm of unspecified part of unspecified bronchus or lung: Secondary | ICD-10-CM | POA: Diagnosis not present

## 2023-11-18 NOTE — Progress Notes (Signed)
 Carelink Summary Report / Loop Recorder

## 2023-11-24 ENCOUNTER — Telehealth: Payer: Self-pay | Admitting: *Deleted

## 2023-11-24 NOTE — Telephone Encounter (Signed)
 The patient wants a call about the results of the scan on June 13.  Please call today or tomorrow

## 2023-11-25 NOTE — Telephone Encounter (Signed)
 Dr Jacalyn Martin will discuss the results at patient appointment 11/30/23.

## 2023-11-28 DIAGNOSIS — H35353 Cystoid macular degeneration, bilateral: Secondary | ICD-10-CM | POA: Diagnosis not present

## 2023-11-28 DIAGNOSIS — H353231 Exudative age-related macular degeneration, bilateral, with active choroidal neovascularization: Secondary | ICD-10-CM | POA: Diagnosis not present

## 2023-11-28 DIAGNOSIS — Z961 Presence of intraocular lens: Secondary | ICD-10-CM | POA: Diagnosis not present

## 2023-11-29 DIAGNOSIS — H353211 Exudative age-related macular degeneration, right eye, with active choroidal neovascularization: Secondary | ICD-10-CM | POA: Diagnosis not present

## 2023-11-29 DIAGNOSIS — H353231 Exudative age-related macular degeneration, bilateral, with active choroidal neovascularization: Secondary | ICD-10-CM | POA: Diagnosis not present

## 2023-11-30 ENCOUNTER — Encounter: Payer: Self-pay | Admitting: Radiation Oncology

## 2023-11-30 ENCOUNTER — Ambulatory Visit
Admission: RE | Admit: 2023-11-30 | Discharge: 2023-11-30 | Disposition: A | Discharge status: Home / Self Care | Source: Ambulatory Visit | Attending: Radiation Oncology | Admitting: Radiation Oncology

## 2023-11-30 ENCOUNTER — Other Ambulatory Visit: Payer: Self-pay | Admitting: *Deleted

## 2023-11-30 VITALS — BP 156/75 | HR 59 | Temp 97.0°F | Resp 16 | Ht 69.0 in

## 2023-11-30 DIAGNOSIS — C3432 Malignant neoplasm of lower lobe, left bronchus or lung: Secondary | ICD-10-CM | POA: Diagnosis not present

## 2023-11-30 DIAGNOSIS — R918 Other nonspecific abnormal finding of lung field: Secondary | ICD-10-CM | POA: Diagnosis not present

## 2023-11-30 DIAGNOSIS — Z87890 Personal history of sex reassignment: Secondary | ICD-10-CM | POA: Diagnosis not present

## 2023-11-30 DIAGNOSIS — R0602 Shortness of breath: Secondary | ICD-10-CM | POA: Insufficient documentation

## 2023-11-30 DIAGNOSIS — Z923 Personal history of irradiation: Secondary | ICD-10-CM | POA: Insufficient documentation

## 2023-11-30 DIAGNOSIS — F419 Anxiety disorder, unspecified: Secondary | ICD-10-CM | POA: Insufficient documentation

## 2023-11-30 NOTE — Progress Notes (Signed)
 Radiation Oncology Follow up Note  Name: William Mueller   Date:   11/30/2023 MRN:  [id number] DOB: 25-Mar-1951    This 73 y.o. male presents to the clinic today for 41-month follow-up status post SBRT to his left lower lobe for stage I non-small cell lung cancer.  REFERRING PROVIDER: Hamrick, Charlene CROME, MD  HPI: Patient is a 73 year old male now out 4 months having completed SBRT to his left lower lobe for stage I non-small cell lung cancer.  Seen today in routine follow-up he is doing well he states he had no real significant change in his pulmonary status he does have a dry cough no hemoptysis or chest tightness.  He is quite anxious he is on nasal oxygen  with good saturations although he states he is continually short of breath.  He is seeing pulmonary medicine next week.  He had a recent CT scan of his chest.  Showing interval response to therapy with decrease size of the dominant left lower lobe pulmonary nodule.  Additional scattered pulmonary nodules are stable.  Patient is currently on Xanax  for anxiety as well as steroids.  COMPLICATIONS OF TREATMENT: none  FOLLOW UP COMPLIANCE: keeps appointments   PHYSICAL EXAM:  BP (!) 156/75   Pulse (!) 59   Temp (!) 97 F (36.1 C) (Tympanic)   Resp 16   Ht 5' 9 (1.753 m)   BMI 27.17 kg/m  Wheelchair-bound male in NAD on nasal oxygen .  Well-developed well-nourished patient in NAD. HEENT reveals PERLA, EOMI, discs not visualized.  Oral cavity is clear. No oral mucosal lesions are identified. Neck is clear without evidence of cervical or supraclavicular adenopathy. Lungs are clear to A&P. Cardiac examination is essentially unremarkable with regular rate and rhythm without murmur rub or thrill. Abdomen is benign with no organomegaly or masses noted. Motor sensory and DTR levels are equal and symmetric in the upper and lower extremities. Cranial nerves II through XII are grossly intact. Proprioception is intact. No peripheral adenopathy or edema is  identified. No motor or sensory levels are noted. Crude visual fields are within normal range.  RADIOLOGY RESULTS: CT scan of the chest reviewed compatible with above-stated findings  PLAN: The present time patient is doing well good response by CT criteria of his left lower lobe lesion.  I will see him back in 6 months with a repeat CT scan at that time.  He sees pulmonology next week and I have asked him to report what he has been telling me about his anxiousness and shortness of breath.  Patient knows to call with any concerns.  I would like to take this opportunity to thank you for allowing me to participate in the care of your patient.SABRA Marcey Penton, MD

## 2023-12-07 DIAGNOSIS — J449 Chronic obstructive pulmonary disease, unspecified: Secondary | ICD-10-CM | POA: Diagnosis not present

## 2023-12-07 DIAGNOSIS — G4700 Insomnia, unspecified: Secondary | ICD-10-CM | POA: Diagnosis not present

## 2023-12-07 DIAGNOSIS — C3412 Malignant neoplasm of upper lobe, left bronchus or lung: Secondary | ICD-10-CM | POA: Diagnosis not present

## 2023-12-08 ENCOUNTER — Ambulatory Visit

## 2023-12-08 DIAGNOSIS — I639 Cerebral infarction, unspecified: Secondary | ICD-10-CM | POA: Diagnosis not present

## 2023-12-08 LAB — CUP PACEART REMOTE DEVICE CHECK
Date Time Interrogation Session: 20250702230918
Implantable Pulse Generator Implant Date: 20220209

## 2023-12-11 DIAGNOSIS — J449 Chronic obstructive pulmonary disease, unspecified: Secondary | ICD-10-CM | POA: Diagnosis not present

## 2023-12-17 ENCOUNTER — Ambulatory Visit: Payer: Self-pay | Admitting: Cardiology

## 2023-12-27 NOTE — Progress Notes (Signed)
 Carelink Summary Report / Loop Recorder

## 2024-01-02 DIAGNOSIS — H353231 Exudative age-related macular degeneration, bilateral, with active choroidal neovascularization: Secondary | ICD-10-CM | POA: Diagnosis not present

## 2024-01-02 DIAGNOSIS — H353211 Exudative age-related macular degeneration, right eye, with active choroidal neovascularization: Secondary | ICD-10-CM | POA: Diagnosis not present

## 2024-01-09 ENCOUNTER — Ambulatory Visit

## 2024-01-09 DIAGNOSIS — I639 Cerebral infarction, unspecified: Secondary | ICD-10-CM | POA: Diagnosis not present

## 2024-01-10 LAB — CUP PACEART REMOTE DEVICE CHECK
Date Time Interrogation Session: 20250802230713
Implantable Pulse Generator Implant Date: 20220209

## 2024-01-11 ENCOUNTER — Ambulatory Visit: Payer: Self-pay | Admitting: Cardiology

## 2024-01-11 DIAGNOSIS — J449 Chronic obstructive pulmonary disease, unspecified: Secondary | ICD-10-CM | POA: Diagnosis not present

## 2024-02-04 ENCOUNTER — Telehealth: Payer: Self-pay | Admitting: Physician Assistant

## 2024-02-04 DIAGNOSIS — I2510 Atherosclerotic heart disease of native coronary artery without angina pectoris: Secondary | ICD-10-CM

## 2024-02-04 MED ORDER — RANOLAZINE ER 500 MG PO TB12
500.0000 mg | ORAL_TABLET | Freq: Two times a day (BID) | ORAL | 2 refills | Status: DC
Start: 2024-02-04 — End: 2024-03-07

## 2024-02-04 NOTE — Telephone Encounter (Signed)
 Patient has changed pharmacies from [facility] to [facility] in [place].  Given pharmacy change, he needs a refill of Ranexa  500 mg bid. He is currently without symptoms of angina or cardiac decompensation with baseline chronic dyspnea. Follow up as advised.

## 2024-02-07 DIAGNOSIS — J310 Chronic rhinitis: Secondary | ICD-10-CM | POA: Diagnosis not present

## 2024-02-07 DIAGNOSIS — H6523 Chronic serous otitis media, bilateral: Secondary | ICD-10-CM | POA: Diagnosis not present

## 2024-02-09 ENCOUNTER — Ambulatory Visit

## 2024-02-09 DIAGNOSIS — H353211 Exudative age-related macular degeneration, right eye, with active choroidal neovascularization: Secondary | ICD-10-CM | POA: Diagnosis not present

## 2024-02-09 DIAGNOSIS — I639 Cerebral infarction, unspecified: Secondary | ICD-10-CM

## 2024-02-09 DIAGNOSIS — H353231 Exudative age-related macular degeneration, bilateral, with active choroidal neovascularization: Secondary | ICD-10-CM | POA: Diagnosis not present

## 2024-02-09 LAB — CUP PACEART REMOTE DEVICE CHECK
Date Time Interrogation Session: 20250903231125
Implantable Pulse Generator Implant Date: 20220209

## 2024-02-11 ENCOUNTER — Ambulatory Visit: Payer: Self-pay | Admitting: Cardiology

## 2024-02-11 DIAGNOSIS — J449 Chronic obstructive pulmonary disease, unspecified: Secondary | ICD-10-CM | POA: Diagnosis not present

## 2024-02-18 NOTE — Progress Notes (Signed)
 Remote Loop Recorder Transmission

## 2024-02-19 ENCOUNTER — Other Ambulatory Visit: Payer: Self-pay

## 2024-02-19 ENCOUNTER — Emergency Department
Admission: EM | Admit: 2024-02-19 | Discharge: 2024-02-19 | Disposition: A | Discharge status: Home / Self Care | Attending: Emergency Medicine | Admitting: Emergency Medicine

## 2024-02-19 ENCOUNTER — Emergency Department

## 2024-02-19 DIAGNOSIS — J449 Chronic obstructive pulmonary disease, unspecified: Secondary | ICD-10-CM | POA: Diagnosis not present

## 2024-02-19 DIAGNOSIS — Z79899 Other long term (current) drug therapy: Secondary | ICD-10-CM | POA: Diagnosis not present

## 2024-02-19 DIAGNOSIS — R790 Abnormal level of blood mineral: Secondary | ICD-10-CM | POA: Diagnosis not present

## 2024-02-19 DIAGNOSIS — J439 Emphysema, unspecified: Secondary | ICD-10-CM | POA: Diagnosis not present

## 2024-02-19 DIAGNOSIS — R059 Cough, unspecified: Secondary | ICD-10-CM | POA: Diagnosis not present

## 2024-02-19 DIAGNOSIS — Z85118 Personal history of other malignant neoplasm of bronchus and lung: Secondary | ICD-10-CM | POA: Insufficient documentation

## 2024-02-19 DIAGNOSIS — R911 Solitary pulmonary nodule: Secondary | ICD-10-CM | POA: Diagnosis not present

## 2024-02-19 DIAGNOSIS — C3432 Malignant neoplasm of lower lobe, left bronchus or lung: Secondary | ICD-10-CM | POA: Diagnosis not present

## 2024-02-19 DIAGNOSIS — R051 Acute cough: Secondary | ICD-10-CM | POA: Diagnosis not present

## 2024-02-19 DIAGNOSIS — R0602 Shortness of breath: Secondary | ICD-10-CM | POA: Insufficient documentation

## 2024-02-19 DIAGNOSIS — I2510 Atherosclerotic heart disease of native coronary artery without angina pectoris: Secondary | ICD-10-CM | POA: Diagnosis not present

## 2024-02-19 DIAGNOSIS — J432 Centrilobular emphysema: Secondary | ICD-10-CM | POA: Diagnosis not present

## 2024-02-19 DIAGNOSIS — R609 Edema, unspecified: Secondary | ICD-10-CM | POA: Diagnosis not present

## 2024-02-19 DIAGNOSIS — I10 Essential (primary) hypertension: Secondary | ICD-10-CM | POA: Diagnosis not present

## 2024-02-19 DIAGNOSIS — J929 Pleural plaque without asbestos: Secondary | ICD-10-CM | POA: Diagnosis not present

## 2024-02-19 LAB — CBC WITH DIFFERENTIAL/PLATELET
Abs Immature Granulocytes: 0.88 K/uL — ABNORMAL HIGH (ref 0.00–0.07)
Basophils Absolute: 0.1 K/uL (ref 0.0–0.1)
Basophils Relative: 0 %
Eosinophils Absolute: 0.4 K/uL (ref 0.0–0.5)
Eosinophils Relative: 2 %
HCT: 41.9 % (ref 39.0–52.0)
Hemoglobin: 13.3 g/dL (ref 13.0–17.0)
Immature Granulocytes: 4 %
Lymphocytes Relative: 6 %
Lymphs Abs: 1.4 K/uL (ref 0.7–4.0)
MCH: 27.9 pg (ref 26.0–34.0)
MCHC: 31.7 g/dL (ref 30.0–36.0)
MCV: 87.8 fL (ref 80.0–100.0)
Monocytes Absolute: 2.7 K/uL — ABNORMAL HIGH (ref 0.1–1.0)
Monocytes Relative: 12 %
Neutro Abs: 16.8 K/uL — ABNORMAL HIGH (ref 1.7–7.7)
Neutrophils Relative %: 76 %
Platelets: 303 K/uL (ref 150–400)
RBC: 4.77 MIL/uL (ref 4.22–5.81)
RDW: 13 % (ref 11.5–15.5)
WBC: 22.3 K/uL — ABNORMAL HIGH (ref 4.0–10.5)
nRBC: 0 % (ref 0.0–0.2)

## 2024-02-19 LAB — COMPREHENSIVE METABOLIC PANEL WITH GFR
ALT: 51 U/L — ABNORMAL HIGH (ref 0–44)
AST: 24 U/L (ref 15–41)
Albumin: 2.9 g/dL — ABNORMAL LOW (ref 3.5–5.0)
Alkaline Phosphatase: 81 U/L (ref 38–126)
Anion gap: 10 (ref 5–15)
BUN: 35 mg/dL — ABNORMAL HIGH (ref 8–23)
CO2: 28 mmol/L (ref 22–32)
Calcium: 8.3 mg/dL — ABNORMAL LOW (ref 8.9–10.3)
Chloride: 98 mmol/L (ref 98–111)
Creatinine, Ser: 1.83 mg/dL — ABNORMAL HIGH (ref 0.61–1.24)
GFR, Estimated: 38 mL/min — ABNORMAL LOW (ref >60)
Glucose, Bld: 99 mg/dL (ref 70–99)
Potassium: 4.2 mmol/L (ref 3.5–5.1)
Sodium: 136 mmol/L (ref 135–145)
Total Bilirubin: 1 mg/dL (ref 0.0–1.2)
Total Protein: 5.7 g/dL — ABNORMAL LOW (ref 6.5–8.1)

## 2024-02-19 LAB — D-DIMER, QUANTITATIVE: D-Dimer, Quant: 0.67 ug{FEU}/mL — ABNORMAL HIGH (ref 0.00–0.50)

## 2024-02-19 LAB — TROPONIN I (HIGH SENSITIVITY)
Troponin I (High Sensitivity): 28 ng/L — ABNORMAL HIGH (ref <18)
Troponin I (High Sensitivity): 23 ng/L — ABNORMAL HIGH (ref <18)

## 2024-02-19 MED ORDER — IPRATROPIUM-ALBUTEROL 0.5-2.5 (3) MG/3ML IN SOLN
6.0000 mL | Freq: Once | RESPIRATORY_TRACT | Status: AC
  Administered 2024-02-19: 6 mL via RESPIRATORY_TRACT
  Filled 2024-02-19: qty 6

## 2024-02-19 MED ORDER — METHYLPREDNISOLONE SODIUM SUCC 125 MG IJ SOLR
125.0000 mg | Freq: Once | INTRAMUSCULAR | Status: AC
  Administered 2024-02-19: 125 mg via INTRAVENOUS
  Filled 2024-02-19: qty 2

## 2024-02-19 MED ORDER — IOHEXOL 350 MG/ML SOLN
50.0000 mL | Freq: Once | INTRAVENOUS | Status: AC | PRN
  Administered 2024-02-19: 50 mL via INTRAVENOUS

## 2024-02-19 MED ORDER — AMOXICILLIN-POT CLAVULANATE 875-125 MG PO TABS: 1.0000 | ORAL_TABLET | Freq: Two times a day (BID) | ORAL | 0 refills | Status: AC

## 2024-02-19 NOTE — ED Notes (Signed)
 Pt in bed, clothing given, pt states that he has Oxygen  in his car, states that he is ready to go home, read and reviewed d/c instructions and follow up, pt verbalized understanding, pt from department with sig other via wheel chair.

## 2024-02-19 NOTE — ED Triage Notes (Signed)
 Pt to er room number 2 via ems, per ems pt has been more sob for the past week, states that he recently finished a course of prednisone  and gained about 20 lbs on the prednisone .  Pt states that he is normally on 2L O2via [place], pt currently on 4L vis [place], pt satting 100% on 4L, pt has some accessory muscle use.

## 2024-02-19 NOTE — ED Provider Notes (Signed)
 [facility] Provider Note    Event Date/Time   First MD Initiated Contact with Patient 02/19/24 [phone]     (approximate)   History   Shortness of Breath   HPI  William Mueller is a 73 year old male with history of COPD presenting to the emergency department for evaluation of shortness of breath.  Reports he has had ongoing shortness of breath over the past week.  No noted fevers.  Per EMS was recently on a course of prednisone .  On 2 L home O2.  Reviewed pulmonology visit from 12/07/2023.  At that time patient was continued on Breztri  and Daliresp .  Pulmonology note from 11/10/2023 notes that patient has a history of non-small cell lung cancer for which she had completed 5 radiation treatments at that time.       Physical Exam   Triage Vital Signs: ED Triage Vitals  Encounter Vitals Group     BP 02/19/24 0727 (!) 192/98     Girls Systolic BP Percentile --      Girls Diastolic BP Percentile --      Boys Systolic BP Percentile --      Boys Diastolic BP Percentile --      Pulse Rate 02/19/24 0727 87     Resp 02/19/24 0727 (!) 22     Temp 02/19/24 0727 98.1 F (36.7 C)     Temp Source 02/19/24 0727 Oral     SpO2 02/19/24 0725 100 %     Weight 02/19/24 0729 183 lb (83 kg)     Height 02/19/24 0729 5' 9 (1.753 m)     Head Circumference --      Peak Flow --      Pain Score 02/19/24 0728 0     Pain Loc --      Pain Education --      Exclude from Growth Chart --     Most recent vital signs: Vitals:   02/19/24 1030 02/19/24 1100  BP: (!) 179/77 (!) 170/75  Pulse:    Resp: 19 17  Temp:    SpO2:       General: Awake, interactive, significant hearing impairment CV:  Regular rate, good peripheral perfusion.  Resp:  Tachypnea with mildly labored respirations, lungs overall clear to auscultation Abd:  Nondistended, soft, nontender Neuro:  Symmetric facial movement, fluid speech   ED Results / Procedures / Treatments   Labs (all labs ordered are  listed, but only abnormal results are displayed) Labs Reviewed  CBC WITH DIFFERENTIAL/PLATELET - Abnormal; Notable for the following components:      Result Value   WBC 22.3 (*)    Neutro Abs 16.8 (*)    Monocytes Absolute 2.7 (*)    Abs Immature Granulocytes 0.88 (*)    All other components within normal limits  COMPREHENSIVE METABOLIC PANEL WITH GFR - Abnormal; Notable for the following components:   BUN 35 (*)    Creatinine, Ser 1.83 (*)    Calcium  8.3 (*)    Total Protein 5.7 (*)    Albumin 2.9 (*)    ALT 51 (*)    GFR, Estimated 38 (*)    All other components within normal limits  D-DIMER, QUANTITATIVE - Abnormal; Notable for the following components:   D-Dimer, Quant 0.67 (*)    All other components within normal limits  TROPONIN I (HIGH SENSITIVITY) - Abnormal; Notable for the following components:   Troponin I (High Sensitivity) 28 (*)    All other  components within normal limits  TROPONIN I (HIGH SENSITIVITY) - Abnormal; Notable for the following components:   Troponin I (High Sensitivity) 23 (*)    All other components within normal limits     EKG EKG independently reviewed and interpreted by myself demonstrates:  EKG demonstrates sinus rhythm at a rate of 84, PR 132, QRS 78, QTc 427, nonspecific ST changes  RADIOLOGY Imaging independently reviewed and interpreted by myself demonstrates:  CXR without new focal consolidation  CTA of the chest without PE  Formal Radiology Read:  CT Angio Chest PE W and/or Wo Contrast Result Date: 02/19/2024 CLINICAL DATA:  73 year old male with increasing shortness of breath for 1 week. Recent unexplained weight gain. Lung cancer treated with radiation which completed in February. EXAM: CT ANGIOGRAPHY CHEST WITH CONTRAST TECHNIQUE: Multidetector CT imaging of the chest was performed using the standard protocol during bolus administration of intravenous contrast. Multiplanar CT image reconstructions and MIPs were obtained to evaluate  the vascular anatomy. RADIATION DOSE REDUCTION: This exam was performed according to the departmental dose-optimization program which includes automated exposure control, adjustment of the mA and/or kV according to patient size and/or use of iterative reconstruction technique. CONTRAST:  50mL OMNIPAQUE  IOHEXOL  350 MG/ML SOLN COMPARISON:  Portable chest 0753 hours today.  CTA chest 09/29/2017. Noncontrast chest CT 11/18/2023. FINDINGS: Cardiovascular: Good contrast bolus timing in the pulmonary arterial tree. Mild respiratory motion, primarily in the upper lobes. No pulmonary artery filling defect. Calcified aortic atherosclerosis. Extensive calcified coronary artery atherosclerosis (series 6, image 225). Normal heart size. No pericardial effusion. Mediastinum/Nodes: Negative. No mediastinal mass or lymphadenopathy. Lungs/Pleura: Mild respiratory motion. Atelectatic changes to the carina and central airways which remain patent. Superior segment left lower lobe lung nodule/mass measuring 16 mm on series 5, image 56 is stable. Spiculated margins better demonstrated previously. Mild adjacent posterior pleural thickening is new. No overt pleural effusion. Underlying hyperinflation and centrilobular emphysema which is most apparent in the upper lungs. Stable contralateral posterior right upper lobe mild subpleural nodularity and scarring. No new lung parenchymal abnormality. Upper Abdomen: Mild contrast reflux into the hepatic veins. Otherwise stable and negative visible noncontrast liver, gallbladder, spleen, pancreas, adrenal glands, kidneys, and bowel in the upper abdomen (transverse colon diverticulosis. Musculoskeletal: Stable mild T7 compression deformity. No acute or suspicious osseous lesion. Left anterior chest wall cardiac loop recorder or less likely superficial ICD. Review of the MIP images confirms the above findings. IMPRESSION: 1. Negative for acute pulmonary embolus. 2. Stable Left lower lobe lung cancer,  16 mm. Mild adjacent posterior pleural thickening is new since 2022, likely treatment related. Emphysema (ICD10-J43.9). No new pulmonary abnormality. 3. Aortic Atherosclerosis (ICD10-I70.0) and advanced calcified coronary artery atherosclerosis. Electronically Signed   By: VEAR Hurst M.D.   On: 02/19/2024 09:13   DG Chest Portable 1 View Result Date: 02/19/2024 EXAM: 1 VIEW XRAY OF THE CHEST 02/19/2024 07:59:05 AM COMPARISON: 02/24/2023 CLINICAL HISTORY: Shortness of breath. SOB worsening x 1 week. Patient states that he recently finished a course of prednisone  and gained about 20 lbs. Hx of COPD. FINDINGS: LUNGS AND PLEURA: Chronic emphysema. Left upper lobe lung nodule corresponding to known malignancy has decreased in size measuring 1 cm on today's study. No pulmonary edema. No pleural effusion. No pneumothorax. HEART AND MEDIASTINUM: Left chest cardiac loop recorder or lead less ICD noted. No acute abnormality of the cardiac and mediastinal silhouettes. BONES AND SOFT TISSUES: No acute osseous abnormality. IMPRESSION: 1. No acute findings. 2. Chronic emphysema. 3. Left upper lobe  lung nodule corresponding to known malignancy, decreased in size, measuring 1 cm. Electronically signed by: Waddell Calk MD 02/19/2024 08:15 AM EDT RP Workstation: [id number]    PROCEDURES:  Critical Care performed: No  Procedures   MEDICATIONS ORDERED IN ED: Medications  ipratropium-albuterol  (DUONEB) 0.5-2.5 (3) MG/3ML nebulizer solution 6 mL (6 mLs Nebulization Given 02/19/24 0739)  methylPREDNISolone  sodium succinate (SOLU-MEDROL ) 125 mg/2 mL injection 125 mg (125 mg Intravenous Given 02/19/24 0741)  iohexol  (OMNIPAQUE ) 350 MG/ML injection 50 mL (50 mLs Intravenous Contrast Given 02/19/24 0844)     IMPRESSION / MDM / ASSESSMENT AND PLAN / ED COURSE  I reviewed the triage vital signs and the nursing notes.  Differential diagnosis includes, but is not limited to, COPD exacerbation, pneumonia, PE, ACS  Patient's  presentation is most consistent with acute presentation with potential threat to life or bodily function.  73 year old male presenting to the emergency department for evaluation of shortness of breath.  History of COPD, but actually has fair air movement without significant wheezing on initial presentation.  Will trial DuoNebs and steroids.  Labs with significant leukocytosis but patient was recently on steroids which may be contributing.  CMP with stable renal impairment.  Initial troponin slightly elevated at 28, repeat stable to improving at 23.  D-dimer mildly elevated.  Given patient is high risk with his history of malignancy, CT of the chest was obtained which was without evidence of pneumonia or pulmonary embolism.  Patient was reassessed and reported significant improvement in his breathing.  Family presented to bedside who notes that patient is in the process of transitioning to in-home hospice.  I did consider admission given recent shortness of breath and slightly elevated troponin, but after discussion with patient he would prefer to follow-up with his cardiologist.  Particularly in light of plan to transition to hospice, do think this is reasonable.  He was recently on steroids, prefers to hold off on second prednisone  course.  Says he has not been on recent antibiotics, does have a sputum change with productive cough here, will DC on short course of Augmentin .  Strict return precautions provided.  Patient stable condition.        FINAL CLINICAL IMPRESSION(S) / ED DIAGNOSES   Final diagnoses:  Shortness of breath  Acute cough     Rx / DC Orders   ED Discharge Orders          Ordered    amoxicillin -clavulanate (AUGMENTIN ) 875-125 MG tablet  2 times daily        02/19/24 1131             Note:  This document was prepared using Dragon voice recognition software and may include unintentional dictation errors.   Levander Slate, MD 02/19/24 [phone]

## 2024-02-19 NOTE — Discharge Instructions (Signed)
 You are seen in the ER today for shortness of breath.  We fortunately did not find an emergency cause for this.  You may have a flareup of your COPD.  I sent a prescription for an antibiotic to your pharmacy.  Follow with your primary care doctor for further evaluation.  Please also follow-up with your cardiologist for further evaluation of your shortness of breath.  Return to the ER for new or worsening symptoms.

## 2024-02-20 DIAGNOSIS — J449 Chronic obstructive pulmonary disease, unspecified: Secondary | ICD-10-CM | POA: Diagnosis not present

## 2024-02-21 ENCOUNTER — Ambulatory Visit: Admitting: Cardiology

## 2024-03-02 ENCOUNTER — Telehealth: Payer: Self-pay

## 2024-03-05 NOTE — Progress Notes (Signed)
 Remote Loop Recorder Transmission

## 2024-03-07 NOTE — Telephone Encounter (Signed)
 Spoke with patient's wife to check on his status.  Patient is actively dying at present. Hospice give 24 - 48 hours before his passing.   Extended our concerns, condolences and prayers.

## 2024-03-07 NOTE — Telephone Encounter (Signed)
 Alert remote transmission:  AF 30 logged AF events, poor rate control, longest duration , no hx of PAF per EPIC - route to triage high alert per protocol Presenting ST vs AFL  Patient with hx of malignant lung CA, recent exacerbation, steriod and abx use in past month. Also, now currently under Hospice care.

## 2024-03-07 DEATH — deceased

## 2024-03-12 ENCOUNTER — Encounter

## 2024-03-16 NOTE — Progress Notes (Signed)
 Remote Loop Recorder Transmission

## 2024-04-12 ENCOUNTER — Encounter

## 2024-05-14 ENCOUNTER — Encounter

## 2024-06-04 ENCOUNTER — Other Ambulatory Visit

## 2024-06-11 ENCOUNTER — Ambulatory Visit: Admitting: Radiation Oncology
